# Patient Record
Sex: Female | Born: 1960 | Race: Black or African American | Hispanic: No | Marital: Single
Health system: Midwestern US, Community
[De-identification: ages and names within clinical notes are randomized; demographics above are authoritative.]

## PROBLEM LIST (undated history)

## (undated) DIAGNOSIS — J45909 Unspecified asthma, uncomplicated: Secondary | ICD-10-CM

## (undated) DIAGNOSIS — I739 Peripheral vascular disease, unspecified: Secondary | ICD-10-CM

## (undated) DIAGNOSIS — J811 Chronic pulmonary edema: Secondary | ICD-10-CM

## (undated) DIAGNOSIS — M179 Osteoarthritis of knee, unspecified: Secondary | ICD-10-CM

## (undated) DIAGNOSIS — I82409 Acute embolism and thrombosis of unspecified deep veins of unspecified lower extremity: Secondary | ICD-10-CM

## (undated) DIAGNOSIS — F419 Anxiety disorder, unspecified: Secondary | ICD-10-CM

## (undated) DIAGNOSIS — I10 Essential (primary) hypertension: Secondary | ICD-10-CM

## (undated) DIAGNOSIS — I509 Heart failure, unspecified: Secondary | ICD-10-CM

## (undated) DIAGNOSIS — G2581 Restless legs syndrome: Secondary | ICD-10-CM

## (undated) DIAGNOSIS — M1710 Unilateral primary osteoarthritis, unspecified knee: Secondary | ICD-10-CM

## (undated) DIAGNOSIS — N3946 Mixed incontinence: Secondary | ICD-10-CM

## (undated) DIAGNOSIS — M797 Fibromyalgia: Secondary | ICD-10-CM

## (undated) DIAGNOSIS — K029 Dental caries, unspecified: Secondary | ICD-10-CM

## (undated) DIAGNOSIS — E785 Hyperlipidemia, unspecified: Secondary | ICD-10-CM

## (undated) DIAGNOSIS — N9489 Other specified conditions associated with female genital organs and menstrual cycle: Secondary | ICD-10-CM

## (undated) DIAGNOSIS — M2550 Pain in unspecified joint: Secondary | ICD-10-CM

## (undated) DIAGNOSIS — Z973 Presence of spectacles and contact lenses: Secondary | ICD-10-CM

## (undated) DIAGNOSIS — N63 Unspecified lump in unspecified breast: Secondary | ICD-10-CM

## (undated) DIAGNOSIS — Z720 Tobacco use: Secondary | ICD-10-CM

## (undated) DIAGNOSIS — G894 Chronic pain syndrome: Secondary | ICD-10-CM

## (undated) DIAGNOSIS — F329 Major depressive disorder, single episode, unspecified: Secondary | ICD-10-CM

## (undated) DIAGNOSIS — K5732 Diverticulitis of large intestine without perforation or abscess without bleeding: Secondary | ICD-10-CM

## (undated) DIAGNOSIS — F32A Depression, unspecified: Secondary | ICD-10-CM

## (undated) DIAGNOSIS — F319 Bipolar disorder, unspecified: Secondary | ICD-10-CM

## (undated) DIAGNOSIS — N926 Irregular menstruation, unspecified: Secondary | ICD-10-CM

## (undated) DIAGNOSIS — Z1211 Encounter for screening for malignant neoplasm of colon: Secondary | ICD-10-CM

## (undated) HISTORY — DX: Osteoarthritis of knee, unspecified: M17.9

## (undated) HISTORY — DX: Diverticulitis of large intestine without perforation or abscess without bleeding: K57.32

## (undated) HISTORY — DX: Major depressive disorder, single episode, unspecified: F32.9

## (undated) HISTORY — DX: Depression, unspecified: F32.A

## (undated) HISTORY — DX: Mixed incontinence: N39.46

## (undated) HISTORY — DX: Tobacco use: Z72.0

## (undated) HISTORY — DX: Irregular menstruation, unspecified: N92.6

## (undated) HISTORY — DX: Unilateral primary osteoarthritis, unspecified knee: M17.10

## (undated) HISTORY — DX: Unspecified asthma, uncomplicated: J45.909

## (undated) HISTORY — DX: Peripheral vascular disease, unspecified: I73.9

## (undated) HISTORY — DX: Restless legs syndrome: G25.81

## (undated) HISTORY — DX: Chronic pain syndrome: G89.4

## (undated) HISTORY — DX: Unspecified lump in unspecified breast: N63.0

## (undated) HISTORY — DX: Hyperlipidemia, unspecified: E78.5

## (undated) HISTORY — DX: Anxiety disorder, unspecified: F41.9

## (undated) HISTORY — DX: Acute embolism and thrombosis of unspecified deep veins of unspecified lower extremity: I82.409

## (undated) HISTORY — DX: Essential (primary) hypertension: I10

## (undated) HISTORY — DX: Chronic pulmonary edema: J81.1

## (undated) HISTORY — DX: Other specified conditions associated with female genital organs and menstrual cycle: N94.89

## (undated) HISTORY — PX: TUBAL LIGATION: SHX77

## (undated) HISTORY — DX: Pain in unspecified joint: M25.50

## (undated) HISTORY — PX: TONSILLECTOMY: SUR1361

## (undated) HISTORY — PX: ABDOMINAL AORTAGRAM: SHX5706

---

## 2006-04-03 ENCOUNTER — Emergency Department (HOSPITAL_COMMUNITY): Admission: EM | Admit: 2006-04-03 | Discharge: 2006-04-03 | Payer: Self-pay | Admitting: Emergency Medicine

## 2006-04-12 ENCOUNTER — Ambulatory Visit: Payer: Self-pay | Admitting: Internal Medicine

## 2006-04-12 ENCOUNTER — Ambulatory Visit: Payer: Self-pay | Admitting: Pulmonary Disease

## 2006-04-12 ENCOUNTER — Inpatient Hospital Stay (HOSPITAL_COMMUNITY): Admission: EM | Admit: 2006-04-12 | Discharge: 2006-04-17 | Payer: Self-pay | Admitting: Emergency Medicine

## 2006-04-14 ENCOUNTER — Encounter (INDEPENDENT_AMBULATORY_CARE_PROVIDER_SITE_OTHER): Payer: Self-pay | Admitting: Interventional Cardiology

## 2006-04-21 ENCOUNTER — Ambulatory Visit: Payer: Self-pay | Admitting: Internal Medicine

## 2006-05-15 ENCOUNTER — Ambulatory Visit: Payer: Self-pay | Admitting: Internal Medicine

## 2006-05-29 ENCOUNTER — Ambulatory Visit: Payer: Self-pay | Admitting: Internal Medicine

## 2006-09-09 ENCOUNTER — Ambulatory Visit: Payer: Self-pay | Admitting: Internal Medicine

## 2006-09-12 ENCOUNTER — Ambulatory Visit: Payer: Self-pay | Admitting: Internal Medicine

## 2006-09-12 ENCOUNTER — Ambulatory Visit (HOSPITAL_COMMUNITY): Admission: RE | Admit: 2006-09-12 | Discharge: 2006-09-12 | Payer: Self-pay | Admitting: Internal Medicine

## 2006-09-12 ENCOUNTER — Encounter: Payer: Self-pay | Admitting: Vascular Surgery

## 2006-10-10 ENCOUNTER — Ambulatory Visit: Payer: Self-pay | Admitting: Internal Medicine

## 2006-10-10 ENCOUNTER — Encounter (INDEPENDENT_AMBULATORY_CARE_PROVIDER_SITE_OTHER): Payer: Self-pay | Admitting: Pulmonary Disease

## 2006-10-10 DIAGNOSIS — E782 Mixed hyperlipidemia: Secondary | ICD-10-CM | POA: Insufficient documentation

## 2006-10-10 LAB — CONVERTED CEMR LAB
ALT: 24 units/L (ref 0–40)
Albumin: 4.2 g/dL (ref 3.5–5.2)
Bilirubin, Direct: <0.1 mg/dL (ref 0.0–0.3)
Chloride: 99 meq/L (ref 96–112)
Cholesterol: 188 mg/dL (ref 0–200)
Glucose, Bld: 101 mg/dL — ABNORMAL HIGH (ref 70–99)
MCHC: 34.4 g/dL (ref 30.0–36.0)
Potassium: 4.3 meq/L (ref 3.5–5.3)
RBC: 4.47 M/uL (ref 3.87–5.11)
RDW: 12.8 % (ref 11.5–14.0)
Sodium: 134 meq/L — ABNORMAL LOW (ref 135–145)
Total CHOL/HDL Ratio: 6.7
Total Protein: 7.5 g/dL (ref 6.0–8.3)
Triglycerides: 213 mg/dL — ABNORMAL HIGH (ref <150)
VLDL: 43 mg/dL — ABNORMAL HIGH (ref 0–40)

## 2006-10-14 ENCOUNTER — Ambulatory Visit (HOSPITAL_COMMUNITY): Admission: RE | Admit: 2006-10-14 | Discharge: 2006-10-14 | Payer: Self-pay | Admitting: Vascular Surgery

## 2006-10-20 DIAGNOSIS — I739 Peripheral vascular disease, unspecified: Secondary | ICD-10-CM | POA: Insufficient documentation

## 2006-10-20 DIAGNOSIS — F17200 Nicotine dependence, unspecified, uncomplicated: Secondary | ICD-10-CM | POA: Insufficient documentation

## 2006-10-20 DIAGNOSIS — I10 Essential (primary) hypertension: Secondary | ICD-10-CM | POA: Insufficient documentation

## 2006-12-05 ENCOUNTER — Ambulatory Visit: Payer: Self-pay | Admitting: Internal Medicine

## 2007-03-16 ENCOUNTER — Encounter (INDEPENDENT_AMBULATORY_CARE_PROVIDER_SITE_OTHER): Payer: Self-pay | Admitting: Internal Medicine

## 2007-03-16 ENCOUNTER — Ambulatory Visit: Payer: Self-pay | Admitting: Internal Medicine

## 2007-03-16 LAB — CONVERTED CEMR LAB
BUN: 12 mg/dL (ref 6–23)
Calcium: 9.4 mg/dL (ref 8.4–10.5)
Creatinine, Ser: 0.86 mg/dL (ref 0.40–1.20)
Glucose, Bld: 103 mg/dL — ABNORMAL HIGH (ref 70–99)

## 2007-05-06 ENCOUNTER — Ambulatory Visit: Payer: Self-pay | Admitting: Internal Medicine

## 2007-07-24 ENCOUNTER — Encounter: Payer: Self-pay | Admitting: Licensed Clinical Social Worker

## 2007-07-24 ENCOUNTER — Ambulatory Visit: Payer: Self-pay | Admitting: Internal Medicine

## 2007-07-24 ENCOUNTER — Encounter (INDEPENDENT_AMBULATORY_CARE_PROVIDER_SITE_OTHER): Payer: Self-pay | Admitting: Internal Medicine

## 2007-07-24 ENCOUNTER — Telehealth: Payer: Self-pay | Admitting: *Deleted

## 2007-07-27 LAB — CONVERTED CEMR LAB
Albumin: 4.3 g/dL (ref 3.5–5.2)
Alkaline Phosphatase: 54 units/L (ref 39–117)
CO2: 23 meq/L (ref 19–32)
Eosinophils Absolute: 0.3 10*3/uL (ref 0.0–0.7)
Glucose, Bld: 88 mg/dL (ref 70–99)
LDL Cholesterol: 117 mg/dL — ABNORMAL HIGH (ref 0–99)
Lymphocytes Relative: 30 % (ref 12–46)
Lymphs Abs: 2.9 10*3/uL (ref 0.7–3.3)
Neutro Abs: 5.8 10*3/uL (ref 1.7–7.7)
Neutrophils Relative %: 60 % (ref 43–77)
Platelets: 260 10*3/uL (ref 150–400)
Potassium: 4.5 meq/L (ref 3.5–5.3)
Sodium: 139 meq/L (ref 135–145)
Total Protein: 7.2 g/dL (ref 6.0–8.3)
Triglycerides: 165 mg/dL — ABNORMAL HIGH (ref <150)
WBC: 9.7 10*3/uL (ref 4.0–10.5)

## 2007-08-31 DIAGNOSIS — N926 Irregular menstruation, unspecified: Secondary | ICD-10-CM

## 2007-08-31 HISTORY — DX: Irregular menstruation, unspecified: N92.6

## 2007-09-25 ENCOUNTER — Telehealth (INDEPENDENT_AMBULATORY_CARE_PROVIDER_SITE_OTHER): Payer: Self-pay | Admitting: *Deleted

## 2007-10-19 ENCOUNTER — Encounter (INDEPENDENT_AMBULATORY_CARE_PROVIDER_SITE_OTHER): Payer: Self-pay | Admitting: Internal Medicine

## 2007-10-19 ENCOUNTER — Ambulatory Visit: Payer: Self-pay | Admitting: Infectious Diseases

## 2007-10-20 LAB — CONVERTED CEMR LAB
Calcium: 9.6 mg/dL (ref 8.4–10.5)
Glucose, Bld: 104 mg/dL — ABNORMAL HIGH (ref 70–99)
LH: 9.8 milliintl units/mL
MCHC: 32.7 g/dL (ref 30.0–36.0)
MCV: 98.9 fL (ref 78.0–100.0)
Platelets: 259 10*3/uL (ref 150–400)
Potassium: 4.3 meq/L (ref 3.5–5.3)
Sodium: 143 meq/L (ref 135–145)
TSH: 3.247 microintl units/mL (ref 0.350–5.50)

## 2008-02-28 DIAGNOSIS — N63 Unspecified lump in unspecified breast: Secondary | ICD-10-CM

## 2008-02-28 HISTORY — DX: Unspecified lump in unspecified breast: N63.0

## 2008-03-03 ENCOUNTER — Ambulatory Visit: Payer: Self-pay | Admitting: Internal Medicine

## 2008-03-03 DIAGNOSIS — D249 Benign neoplasm of unspecified breast: Secondary | ICD-10-CM | POA: Insufficient documentation

## 2008-03-08 ENCOUNTER — Encounter (INDEPENDENT_AMBULATORY_CARE_PROVIDER_SITE_OTHER): Payer: Self-pay | Admitting: Internal Medicine

## 2008-03-10 ENCOUNTER — Encounter: Admission: RE | Admit: 2008-03-10 | Discharge: 2008-03-10 | Payer: Self-pay | Admitting: *Deleted

## 2008-03-29 ENCOUNTER — Telehealth: Payer: Self-pay | Admitting: *Deleted

## 2008-03-30 ENCOUNTER — Encounter (INDEPENDENT_AMBULATORY_CARE_PROVIDER_SITE_OTHER): Payer: Self-pay | Admitting: *Deleted

## 2008-03-30 ENCOUNTER — Encounter (INDEPENDENT_AMBULATORY_CARE_PROVIDER_SITE_OTHER): Payer: Self-pay | Admitting: Internal Medicine

## 2008-03-30 ENCOUNTER — Ambulatory Visit: Payer: Self-pay | Admitting: *Deleted

## 2008-03-31 LAB — CONVERTED CEMR LAB
Calcium: 9.6 mg/dL (ref 8.4–10.5)
Cholesterol: 211 mg/dL — ABNORMAL HIGH (ref 0–200)
HDL: 37 mg/dL — ABNORMAL LOW (ref >39)
Potassium: 3.9 meq/L (ref 3.5–5.3)
Sodium: 140 meq/L (ref 135–145)
Total CHOL/HDL Ratio: 5.7
Triglycerides: 167 mg/dL — ABNORMAL HIGH (ref <150)
VLDL: 33 mg/dL (ref 0–40)

## 2008-04-04 ENCOUNTER — Telehealth: Payer: Self-pay | Admitting: *Deleted

## 2008-04-07 ENCOUNTER — Encounter (INDEPENDENT_AMBULATORY_CARE_PROVIDER_SITE_OTHER): Payer: Self-pay | Admitting: Radiology

## 2008-04-07 ENCOUNTER — Encounter: Admission: RE | Admit: 2008-04-07 | Discharge: 2008-04-07 | Payer: Self-pay | Admitting: Internal Medicine

## 2008-04-14 ENCOUNTER — Encounter (INDEPENDENT_AMBULATORY_CARE_PROVIDER_SITE_OTHER): Payer: Self-pay | Admitting: Internal Medicine

## 2008-05-10 ENCOUNTER — Telehealth (INDEPENDENT_AMBULATORY_CARE_PROVIDER_SITE_OTHER): Payer: Self-pay | Admitting: Internal Medicine

## 2008-05-12 ENCOUNTER — Telehealth: Payer: Self-pay | Admitting: *Deleted

## 2008-05-12 ENCOUNTER — Encounter (INDEPENDENT_AMBULATORY_CARE_PROVIDER_SITE_OTHER): Payer: Self-pay | Admitting: *Deleted

## 2008-05-12 ENCOUNTER — Ambulatory Visit: Payer: Self-pay | Admitting: *Deleted

## 2008-06-06 LAB — CONVERTED CEMR LAB
AST: 12 units/L (ref 0–37)
Albumin: 4.7 g/dL (ref 3.5–5.2)
Alkaline Phosphatase: 52 units/L (ref 39–117)
BUN: 15 mg/dL (ref 6–23)
Creatinine, Ser: 0.8 mg/dL (ref 0.40–1.20)
Glucose, Bld: 108 mg/dL — ABNORMAL HIGH (ref 70–99)
HDL: 32 mg/dL — ABNORMAL LOW (ref >39)
LDL Cholesterol: 93 mg/dL (ref 0–99)
Total Bilirubin: 0.4 mg/dL (ref 0.3–1.2)
Total CHOL/HDL Ratio: 5
Triglycerides: 175 mg/dL — ABNORMAL HIGH (ref <150)

## 2008-06-13 ENCOUNTER — Encounter (INDEPENDENT_AMBULATORY_CARE_PROVIDER_SITE_OTHER): Payer: Self-pay | Admitting: Surgery

## 2008-06-13 ENCOUNTER — Ambulatory Visit (HOSPITAL_COMMUNITY): Admission: RE | Admit: 2008-06-13 | Discharge: 2008-06-13 | Payer: Self-pay | Admitting: Surgery

## 2008-06-13 ENCOUNTER — Encounter: Admission: RE | Admit: 2008-06-13 | Discharge: 2008-06-13 | Payer: Self-pay | Admitting: Surgery

## 2008-06-13 HISTORY — PX: BREAST LUMPECTOMY W/ NEEDLE LOCALIZATION: SHX1266

## 2008-09-07 ENCOUNTER — Encounter: Payer: Self-pay | Admitting: Internal Medicine

## 2008-09-07 ENCOUNTER — Encounter (INDEPENDENT_AMBULATORY_CARE_PROVIDER_SITE_OTHER): Payer: Self-pay | Admitting: Internal Medicine

## 2008-09-07 ENCOUNTER — Ambulatory Visit: Payer: Self-pay | Admitting: *Deleted

## 2008-09-14 ENCOUNTER — Telehealth (INDEPENDENT_AMBULATORY_CARE_PROVIDER_SITE_OTHER): Payer: Self-pay | Admitting: Internal Medicine

## 2008-09-20 LAB — CONVERTED CEMR LAB
Albumin: 4.6 g/dL (ref 3.5–5.2)
Alkaline Phosphatase: 55 units/L (ref 39–117)
BUN: 18 mg/dL (ref 6–23)
Calcium: 9.9 mg/dL (ref 8.4–10.5)
Creatinine, Ser: 0.84 mg/dL (ref 0.40–1.20)
Glucose, Bld: 102 mg/dL — ABNORMAL HIGH (ref 70–99)
HDL: 37 mg/dL — ABNORMAL LOW (ref >39)
LDL Cholesterol: 144 mg/dL — ABNORMAL HIGH (ref 0–99)
Potassium: 4.5 meq/L (ref 3.5–5.3)
Total CHOL/HDL Ratio: 6.1
Triglycerides: 229 mg/dL — ABNORMAL HIGH (ref <150)

## 2008-09-28 ENCOUNTER — Ambulatory Visit: Payer: Self-pay | Admitting: Vascular Surgery

## 2008-09-28 ENCOUNTER — Encounter (INDEPENDENT_AMBULATORY_CARE_PROVIDER_SITE_OTHER): Payer: Self-pay | Admitting: Internal Medicine

## 2008-10-03 ENCOUNTER — Ambulatory Visit (HOSPITAL_COMMUNITY): Admission: RE | Admit: 2008-10-03 | Discharge: 2008-10-03 | Payer: Self-pay | Admitting: Vascular Surgery

## 2008-10-05 ENCOUNTER — Encounter (INDEPENDENT_AMBULATORY_CARE_PROVIDER_SITE_OTHER): Payer: Self-pay | Admitting: Internal Medicine

## 2008-10-05 ENCOUNTER — Ambulatory Visit: Payer: Self-pay | Admitting: Infectious Disease

## 2008-10-09 LAB — CONVERTED CEMR LAB
BUN: 16 mg/dL (ref 6–23)
CO2: 21 meq/L (ref 19–32)
Creatinine, Ser: 0.92 mg/dL (ref 0.40–1.20)
Eosinophils Relative: 2 % (ref 0–5)
Free T4: 0.91 ng/dL (ref 0.89–1.80)
Glucose, Bld: 94 mg/dL (ref 70–99)
HCT: 45.1 % (ref 36.0–46.0)
Hemoglobin: 14.7 g/dL (ref 12.0–15.0)
INR: 1 (ref 0.0–1.5)
Lymphocytes Relative: 32 % (ref 12–46)
Lymphs Abs: 3.2 10*3/uL (ref 0.7–4.0)
Monocytes Absolute: 0.7 10*3/uL (ref 0.1–1.0)
TSH: 5.059 microintl units/mL — ABNORMAL HIGH (ref 0.350–4.50)
Total Bilirubin: 0.4 mg/dL (ref 0.3–1.2)
WBC: 10 10*3/uL (ref 4.0–10.5)

## 2008-10-26 ENCOUNTER — Ambulatory Visit: Payer: Self-pay | Admitting: Vascular Surgery

## 2008-10-28 ENCOUNTER — Encounter (INDEPENDENT_AMBULATORY_CARE_PROVIDER_SITE_OTHER): Payer: Self-pay | Admitting: Internal Medicine

## 2008-10-28 ENCOUNTER — Ambulatory Visit: Payer: Self-pay | Admitting: Internal Medicine

## 2008-11-04 ENCOUNTER — Ambulatory Visit: Payer: Self-pay | Admitting: Vascular Surgery

## 2008-11-04 ENCOUNTER — Ambulatory Visit (HOSPITAL_COMMUNITY): Admission: RE | Admit: 2008-11-04 | Discharge: 2008-11-04 | Payer: Self-pay | Admitting: Vascular Surgery

## 2008-11-04 HISTORY — PX: OTHER SURGICAL HISTORY: SHX169

## 2008-11-21 ENCOUNTER — Encounter: Payer: Self-pay | Admitting: Vascular Surgery

## 2008-11-21 ENCOUNTER — Inpatient Hospital Stay (HOSPITAL_COMMUNITY): Admission: RE | Admit: 2008-11-21 | Discharge: 2008-11-27 | Payer: Self-pay | Admitting: Vascular Surgery

## 2008-11-21 HISTORY — PX: FEMORAL-POPLITEAL BYPASS GRAFT: SHX937

## 2008-11-21 HISTORY — PX: FEMORAL ENDARTERECTOMY: SUR606

## 2008-11-22 ENCOUNTER — Encounter: Payer: Self-pay | Admitting: Vascular Surgery

## 2008-11-23 ENCOUNTER — Telehealth (INDEPENDENT_AMBULATORY_CARE_PROVIDER_SITE_OTHER): Payer: Self-pay | Admitting: Internal Medicine

## 2008-12-01 ENCOUNTER — Telehealth (INDEPENDENT_AMBULATORY_CARE_PROVIDER_SITE_OTHER): Payer: Self-pay | Admitting: Internal Medicine

## 2008-12-07 ENCOUNTER — Ambulatory Visit: Payer: Self-pay | Admitting: Vascular Surgery

## 2008-12-14 ENCOUNTER — Telehealth: Payer: Self-pay | Admitting: Infectious Diseases

## 2008-12-22 ENCOUNTER — Ambulatory Visit: Payer: Self-pay | Admitting: *Deleted

## 2008-12-28 ENCOUNTER — Ambulatory Visit: Payer: Self-pay | Admitting: Vascular Surgery

## 2009-01-25 ENCOUNTER — Ambulatory Visit: Payer: Self-pay | Admitting: Vascular Surgery

## 2009-01-27 ENCOUNTER — Telehealth (INDEPENDENT_AMBULATORY_CARE_PROVIDER_SITE_OTHER): Payer: Self-pay | Admitting: Internal Medicine

## 2009-01-30 ENCOUNTER — Telehealth (INDEPENDENT_AMBULATORY_CARE_PROVIDER_SITE_OTHER): Payer: Self-pay | Admitting: Internal Medicine

## 2009-01-31 ENCOUNTER — Ambulatory Visit: Payer: Self-pay | Admitting: Internal Medicine

## 2009-01-31 ENCOUNTER — Telehealth (INDEPENDENT_AMBULATORY_CARE_PROVIDER_SITE_OTHER): Payer: Self-pay | Admitting: *Deleted

## 2009-01-31 DIAGNOSIS — K089 Disorder of teeth and supporting structures, unspecified: Secondary | ICD-10-CM | POA: Insufficient documentation

## 2009-02-20 ENCOUNTER — Ambulatory Visit: Payer: Self-pay | Admitting: Vascular Surgery

## 2009-02-27 ENCOUNTER — Encounter (INDEPENDENT_AMBULATORY_CARE_PROVIDER_SITE_OTHER): Payer: Self-pay | Admitting: Internal Medicine

## 2009-02-27 ENCOUNTER — Ambulatory Visit: Payer: Self-pay | Admitting: *Deleted

## 2009-02-27 DIAGNOSIS — M25569 Pain in unspecified knee: Secondary | ICD-10-CM | POA: Insufficient documentation

## 2009-02-27 LAB — CONVERTED CEMR LAB
ALT: 13 units/L (ref 0–35)
AST: 12 units/L (ref 0–37)
Alkaline Phosphatase: 59 units/L (ref 39–117)
Anti Nuclear Antibody(ANA): NEGATIVE
Basophils Relative: 1 % (ref 0–1)
Calcium: 9.3 mg/dL (ref 8.4–10.5)
Chloride: 107 meq/L (ref 96–112)
Creatinine, Ser: 0.68 mg/dL (ref 0.40–1.20)
Cyclic Citrullin Peptide Ab: 0 units (ref <7)
Hemoglobin: 13.9 g/dL (ref 12.0–15.0)
LDL Cholesterol: 66 mg/dL (ref 0–99)
Monocytes Relative: 7 % (ref 3–12)
Neutro Abs: 5 10*3/uL (ref 1.7–7.7)
Neutrophils Relative %: 61 % (ref 43–77)
RBC: 4.21 M/uL (ref 3.87–5.11)
RDW: 14.1 % (ref 11.5–15.5)
Sed Rate: 19 mm/hr (ref 0–22)
Total Bilirubin: 0.3 mg/dL (ref 0.3–1.2)
Total CHOL/HDL Ratio: 3.8
VLDL: 21 mg/dL (ref 0–40)

## 2009-04-19 ENCOUNTER — Ambulatory Visit: Payer: Self-pay | Admitting: Vascular Surgery

## 2009-05-18 ENCOUNTER — Ambulatory Visit: Payer: Self-pay | Admitting: Internal Medicine

## 2009-05-30 ENCOUNTER — Encounter: Payer: Self-pay | Admitting: Licensed Clinical Social Worker

## 2009-07-17 ENCOUNTER — Ambulatory Visit (HOSPITAL_COMMUNITY): Admission: RE | Admit: 2009-07-17 | Discharge: 2009-07-17 | Payer: Self-pay | Admitting: Infectious Diseases

## 2009-07-17 ENCOUNTER — Ambulatory Visit: Payer: Self-pay | Admitting: Infectious Diseases

## 2009-07-17 DIAGNOSIS — F418 Other specified anxiety disorders: Secondary | ICD-10-CM | POA: Insufficient documentation

## 2009-07-18 ENCOUNTER — Encounter (INDEPENDENT_AMBULATORY_CARE_PROVIDER_SITE_OTHER): Payer: Self-pay | Admitting: Internal Medicine

## 2009-08-03 ENCOUNTER — Ambulatory Visit: Payer: Self-pay | Admitting: Vascular Surgery

## 2009-08-21 ENCOUNTER — Telehealth (INDEPENDENT_AMBULATORY_CARE_PROVIDER_SITE_OTHER): Payer: Self-pay | Admitting: Internal Medicine

## 2009-08-21 ENCOUNTER — Ambulatory Visit: Payer: Self-pay | Admitting: Internal Medicine

## 2009-08-22 ENCOUNTER — Encounter (INDEPENDENT_AMBULATORY_CARE_PROVIDER_SITE_OTHER): Payer: Self-pay | Admitting: Internal Medicine

## 2009-08-22 ENCOUNTER — Encounter: Payer: Self-pay | Admitting: Licensed Clinical Social Worker

## 2009-08-23 ENCOUNTER — Telehealth (INDEPENDENT_AMBULATORY_CARE_PROVIDER_SITE_OTHER): Payer: Self-pay | Admitting: Internal Medicine

## 2009-08-23 LAB — CONVERTED CEMR LAB
Alkaline Phosphatase: 56 units/L (ref 39–117)
BUN: 13 mg/dL (ref 6–23)
Cholesterol: 130 mg/dL (ref 0–200)
Creatinine, Ser: 0.68 mg/dL (ref 0.40–1.20)
Glucose, Bld: 83 mg/dL (ref 70–99)
HDL: 27 mg/dL — ABNORMAL LOW (ref >39)
LDL Cholesterol: 71 mg/dL (ref 0–99)
Total Bilirubin: 0.3 mg/dL (ref 0.3–1.2)
Triglycerides: 161 mg/dL — ABNORMAL HIGH (ref <150)
VLDL: 32 mg/dL (ref 0–40)

## 2009-08-29 ENCOUNTER — Ambulatory Visit: Payer: Self-pay | Admitting: Internal Medicine

## 2009-08-29 ENCOUNTER — Telehealth: Payer: Self-pay | Admitting: Licensed Clinical Social Worker

## 2009-08-29 ENCOUNTER — Encounter (INDEPENDENT_AMBULATORY_CARE_PROVIDER_SITE_OTHER): Payer: Self-pay | Admitting: Internal Medicine

## 2009-08-31 ENCOUNTER — Telehealth: Payer: Self-pay | Admitting: Internal Medicine

## 2009-09-01 ENCOUNTER — Encounter (INDEPENDENT_AMBULATORY_CARE_PROVIDER_SITE_OTHER): Payer: Self-pay | Admitting: Internal Medicine

## 2009-09-12 ENCOUNTER — Telehealth: Payer: Self-pay | Admitting: Licensed Clinical Social Worker

## 2009-09-12 ENCOUNTER — Telehealth: Payer: Self-pay | Admitting: *Deleted

## 2009-09-13 ENCOUNTER — Telehealth: Payer: Self-pay | Admitting: Licensed Clinical Social Worker

## 2009-09-18 ENCOUNTER — Encounter: Payer: Self-pay | Admitting: Licensed Clinical Social Worker

## 2009-09-18 ENCOUNTER — Telehealth: Payer: Self-pay | Admitting: Licensed Clinical Social Worker

## 2009-09-21 ENCOUNTER — Telehealth (INDEPENDENT_AMBULATORY_CARE_PROVIDER_SITE_OTHER): Payer: Self-pay | Admitting: Internal Medicine

## 2009-09-25 ENCOUNTER — Telehealth: Payer: Self-pay | Admitting: Licensed Clinical Social Worker

## 2009-09-25 ENCOUNTER — Ambulatory Visit: Payer: Self-pay | Admitting: Internal Medicine

## 2009-09-25 DIAGNOSIS — E079 Disorder of thyroid, unspecified: Secondary | ICD-10-CM | POA: Insufficient documentation

## 2009-09-26 ENCOUNTER — Telehealth (INDEPENDENT_AMBULATORY_CARE_PROVIDER_SITE_OTHER): Payer: Self-pay | Admitting: Internal Medicine

## 2009-09-27 ENCOUNTER — Ambulatory Visit: Payer: Self-pay | Admitting: Vascular Surgery

## 2009-10-02 ENCOUNTER — Telehealth (INDEPENDENT_AMBULATORY_CARE_PROVIDER_SITE_OTHER): Payer: Self-pay | Admitting: Internal Medicine

## 2009-10-06 ENCOUNTER — Telehealth: Payer: Self-pay | Admitting: *Deleted

## 2009-10-10 ENCOUNTER — Ambulatory Visit: Payer: Self-pay | Admitting: Internal Medicine

## 2009-10-10 LAB — CONVERTED CEMR LAB
ALT: 15 units/L (ref 0–35)
Albumin: 4.8 g/dL (ref 3.5–5.2)
BUN: 8 mg/dL (ref 6–23)
CO2: 21 meq/L (ref 19–32)
CRP: 0.2 mg/dL (ref <0.6)
Calcium: 9.5 mg/dL (ref 8.4–10.5)
Chloride: 106 meq/L (ref 96–112)
Creatinine, Ser: 0.79 mg/dL (ref 0.40–1.20)
Potassium: 4.3 meq/L (ref 3.5–5.3)
T3, Free: 2.4 pg/mL (ref 2.3–4.2)

## 2009-10-11 ENCOUNTER — Ambulatory Visit (HOSPITAL_COMMUNITY): Admission: RE | Admit: 2009-10-11 | Discharge: 2009-10-11 | Payer: Self-pay | Admitting: Internal Medicine

## 2009-10-11 ENCOUNTER — Ambulatory Visit: Payer: Self-pay | Admitting: Internal Medicine

## 2009-10-18 ENCOUNTER — Telehealth: Payer: Self-pay | Admitting: Licensed Clinical Social Worker

## 2009-10-18 ENCOUNTER — Telehealth (INDEPENDENT_AMBULATORY_CARE_PROVIDER_SITE_OTHER): Payer: Self-pay | Admitting: Internal Medicine

## 2009-10-24 ENCOUNTER — Encounter: Payer: Self-pay | Admitting: Internal Medicine

## 2009-10-25 ENCOUNTER — Telehealth (INDEPENDENT_AMBULATORY_CARE_PROVIDER_SITE_OTHER): Payer: Self-pay | Admitting: Internal Medicine

## 2009-10-30 ENCOUNTER — Encounter (INDEPENDENT_AMBULATORY_CARE_PROVIDER_SITE_OTHER): Payer: Self-pay | Admitting: Internal Medicine

## 2009-11-06 ENCOUNTER — Telehealth (INDEPENDENT_AMBULATORY_CARE_PROVIDER_SITE_OTHER): Payer: Self-pay | Admitting: Internal Medicine

## 2009-11-20 ENCOUNTER — Telehealth (INDEPENDENT_AMBULATORY_CARE_PROVIDER_SITE_OTHER): Payer: Self-pay | Admitting: Internal Medicine

## 2009-11-30 ENCOUNTER — Ambulatory Visit: Payer: Self-pay | Admitting: Vascular Surgery

## 2009-12-01 ENCOUNTER — Encounter (INDEPENDENT_AMBULATORY_CARE_PROVIDER_SITE_OTHER): Payer: Self-pay | Admitting: Internal Medicine

## 2009-12-01 ENCOUNTER — Ambulatory Visit: Payer: Self-pay | Admitting: Infectious Diseases

## 2009-12-01 LAB — CONVERTED CEMR LAB
Alkaline Phosphatase: 56 units/L (ref 39–117)
Cholesterol: 137 mg/dL (ref 0–200)
Creatinine, Ser: 0.71 mg/dL (ref 0.40–1.20)
Glucose, Bld: 93 mg/dL (ref 70–99)
HDL: 33 mg/dL — ABNORMAL LOW (ref >39)
LDL Cholesterol: 83 mg/dL (ref 0–99)
Sodium: 141 meq/L (ref 135–145)
Total Bilirubin: 0.2 mg/dL — ABNORMAL LOW (ref 0.3–1.2)
Total CHOL/HDL Ratio: 4.2
Total Protein: 6.9 g/dL (ref 6.0–8.3)
Triglycerides: 104 mg/dL (ref <150)
VLDL: 21 mg/dL (ref 0–40)

## 2009-12-04 ENCOUNTER — Telehealth (INDEPENDENT_AMBULATORY_CARE_PROVIDER_SITE_OTHER): Payer: Self-pay | Admitting: Internal Medicine

## 2009-12-05 ENCOUNTER — Inpatient Hospital Stay (HOSPITAL_COMMUNITY): Admission: AD | Admit: 2009-12-05 | Discharge: 2009-12-06 | Payer: Self-pay | Admitting: Psychiatry

## 2009-12-05 ENCOUNTER — Ambulatory Visit: Payer: Self-pay | Admitting: Psychiatry

## 2009-12-05 ENCOUNTER — Emergency Department (HOSPITAL_COMMUNITY): Admission: EM | Admit: 2009-12-05 | Discharge: 2009-12-05 | Payer: Self-pay | Admitting: Emergency Medicine

## 2009-12-11 ENCOUNTER — Encounter (INDEPENDENT_AMBULATORY_CARE_PROVIDER_SITE_OTHER): Payer: Self-pay | Admitting: Internal Medicine

## 2009-12-24 ENCOUNTER — Encounter (INDEPENDENT_AMBULATORY_CARE_PROVIDER_SITE_OTHER): Payer: Self-pay | Admitting: Internal Medicine

## 2009-12-26 ENCOUNTER — Telehealth (INDEPENDENT_AMBULATORY_CARE_PROVIDER_SITE_OTHER): Payer: Self-pay | Admitting: Internal Medicine

## 2009-12-27 ENCOUNTER — Telehealth (INDEPENDENT_AMBULATORY_CARE_PROVIDER_SITE_OTHER): Payer: Self-pay | Admitting: Internal Medicine

## 2009-12-27 ENCOUNTER — Ambulatory Visit: Payer: Self-pay | Admitting: Internal Medicine

## 2009-12-28 ENCOUNTER — Encounter (INDEPENDENT_AMBULATORY_CARE_PROVIDER_SITE_OTHER): Payer: Self-pay | Admitting: Internal Medicine

## 2010-01-04 MED ORDER — NAPROXEN 500 MG TAB
500 mg | ORAL_TABLET | Freq: Two times a day (BID) | ORAL | Status: AC
Start: 2010-01-04 — End: 2010-01-14

## 2010-01-04 MED ORDER — TRAMADOL 50 MG TAB
50 mg | ORAL_TABLET | Freq: Four times a day (QID) | ORAL | Status: AC | PRN
Start: 2010-01-04 — End: 2010-01-14

## 2010-01-04 MED ADMIN — tramadol (ULTRAM) tablet 50 mg: ORAL | @ 11:00:00 | NDC 51079099120

## 2010-01-04 MED FILL — TRAMADOL 50 MG TAB: 50 mg | ORAL | Qty: 1
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
## 2010-01-04 NOTE — ED Notes (Incomplete)
I have reviewed discharge instructions with the patient.  The patient verbalized understanding.
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
## 2010-01-06 MED ORDER — DIAZEPAM 5 MG TAB
5 mg | ORAL_TABLET | Freq: Three times a day (TID) | ORAL | Status: DC | PRN
Start: 2010-01-06 — End: 2010-01-28

## 2010-01-06 MED ADMIN — oxycodone-acetaminophen (PERCOCET) 5-325 mg per tablet 2 Tab: ORAL | @ 04:00:00 | NDC 00406051262

## 2010-01-06 MED ADMIN — ketorolac (TORADOL) injection 60 mg: INTRAMUSCULAR | @ 04:00:00 | NDC 00409379501

## 2010-01-06 MED ADMIN — diazepam (VALIUM) tablet 5 mg: ORAL | @ 04:00:00 | NDC 51079028501

## 2010-01-06 MED FILL — KETOROLAC TROMETHAMINE 30 MG/ML INJECTION: 30 mg/mL (1 mL) | INTRAMUSCULAR | Qty: 2

## 2010-01-06 MED FILL — OXYCODONE-ACETAMINOPHEN 5 MG-325 MG TAB: 5-325 mg | ORAL | Qty: 2

## 2010-01-06 MED FILL — DIAZEPAM 5 MG TAB: 5 mg | ORAL | Qty: 1

## 2010-01-06 NOTE — Progress Notes (Signed)
..  I have reviewed discharge instructions with the patient.  The patient verbalized understanding. E signature pad not working at discharge

## 2010-01-08 ENCOUNTER — Telehealth: Payer: Self-pay | Admitting: Internal Medicine

## 2010-01-12 ENCOUNTER — Ambulatory Visit: Payer: Self-pay | Admitting: Internal Medicine

## 2010-01-12 DIAGNOSIS — H547 Unspecified visual loss: Secondary | ICD-10-CM | POA: Insufficient documentation

## 2010-01-15 ENCOUNTER — Encounter (HOSPITAL_COMMUNITY): Admission: RE | Admit: 2010-01-15 | Discharge: 2010-04-06 | Payer: Self-pay | Admitting: Internal Medicine

## 2010-01-26 ENCOUNTER — Telehealth (INDEPENDENT_AMBULATORY_CARE_PROVIDER_SITE_OTHER): Payer: Self-pay | Admitting: Internal Medicine

## 2010-01-28 LAB — METABOLIC PANEL, COMPREHENSIVE
A-G Ratio: 0.9 — ABNORMAL LOW (ref 1.1–2.2)
ALT (SGPT): 39 U/L (ref 12–78)
AST (SGOT): 38 U/L — ABNORMAL HIGH (ref 15–37)
Albumin: 3.4 g/dL — ABNORMAL LOW (ref 3.5–5.0)
Alk. phosphatase: 119 U/L (ref 50–136)
Anion gap: 9 mmol/L (ref 5–15)
BUN/Creatinine ratio: 7 — ABNORMAL LOW (ref 12–20)
BUN: 11 MG/DL (ref 6–20)
Bilirubin, total: 0.4 MG/DL (ref 0.2–1.0)
CO2: 27 MMOL/L (ref 21–32)
Calcium: 8.6 MG/DL (ref 8.5–10.1)
Chloride: 103 MMOL/L (ref 97–108)
Creatinine: 1.5 MG/DL — ABNORMAL HIGH (ref 0.6–1.3)
GFR est AA: 48 mL/min/{1.73_m2} — ABNORMAL LOW (ref >60)
GFR est non-AA: 40 mL/min/{1.73_m2} — ABNORMAL LOW (ref >60)
Globulin: 3.6 g/dL (ref 2.0–4.0)
Glucose: 91 MG/DL (ref 65–100)
Potassium: 3.2 MMOL/L — ABNORMAL LOW (ref 3.5–5.1)
Protein, total: 7 g/dL (ref 6.4–8.2)
Sodium: 139 MMOL/L (ref 136–145)

## 2010-01-28 LAB — CBC WITH AUTOMATED DIFF
ABS. BASOPHILS: 0 10*3/uL (ref 0.0–0.1)
ABS. EOSINOPHILS: 0 10*3/uL (ref 0.0–0.4)
ABS. LYMPHOCYTES: 1.2 10*3/uL (ref 0.8–3.5)
ABS. MONOCYTES: 0.7 10*3/uL (ref 0.0–1.0)
ABS. NEUTROPHILS: 8.1 10*3/uL — ABNORMAL HIGH (ref 1.8–8.0)
BASOPHILS: 0 % (ref 0–1)
EOSINOPHILS: 0 % (ref 0–7)
HCT: 38.1 % (ref 35.0–47.0)
HGB: 12.5 g/dL (ref 11.5–16.0)
LYMPHOCYTES: 12 % (ref 12–49)
MCH: 30.4 PG (ref 26.0–34.0)
MCHC: 32.8 g/dL (ref 30.0–36.5)
MCV: 92.7 FL (ref 80.0–99.0)
MONOCYTES: 7 % (ref 5–13)
NEUTROPHILS: 81 % — ABNORMAL HIGH (ref 32–75)
PLATELET: 211 10*3/uL (ref 150–400)
RBC: 4.11 M/uL (ref 3.80–5.20)
RDW: 12.7 % (ref 11.5–14.5)
WBC: 10.1 10*3/uL (ref 3.6–11.0)

## 2010-01-28 LAB — URINALYSIS W/MICROSCOPIC
Blood: NEGATIVE
Glucose: NEGATIVE MG/DL
Ketone: 40 MG/DL — AB
Leukocyte Esterase: NEGATIVE
Nitrites: NEGATIVE
Protein: 100 MG/DL — AB
Specific gravity: 1.029 (ref 1.003–1.030)
Urobilinogen: 2 EU/DL — ABNORMAL HIGH (ref 0.2–1.0)
pH (UA): 6 (ref 5.0–8.0)

## 2010-01-28 LAB — BILIRUBIN, CONFIRM: Bilirubin UA, confirm: NEGATIVE

## 2010-01-28 MED ORDER — CEPHALEXIN 500 MG CAP
500 mg | ORAL_CAPSULE | Freq: Three times a day (TID) | ORAL | Status: AC
Start: 2010-01-28 — End: 2010-02-04

## 2010-01-28 MED ORDER — HYDROCODONE-ACETAMINOPHEN 7.5 MG-500 MG/15 ML ORAL SOLN
Freq: Four times a day (QID) | ORAL | Status: DC | PRN
Start: 2010-01-28 — End: 2010-08-04

## 2010-01-28 MED ADMIN — ondansetron (ZOFRAN) injection 4 mg: INTRAVENOUS | @ 16:00:00 | NDC 00143989105

## 2010-01-28 MED ADMIN — oxycodone-acetaminophen (PERCOCET) 5-325 mg per tablet 2 Tab: ORAL | @ 17:00:00 | NDC 00406051262

## 2010-01-28 MED ADMIN — saline peripheral flush 5 mL: @ 17:00:00 | NDC 82903065462

## 2010-01-28 MED ADMIN — sodium chloride 0.9 % bolus infusion 1,000 mL: INTRAVENOUS | @ 16:00:00 | NDC 00409798309

## 2010-01-28 MED ADMIN — potassium chloride SR (KLOR-CON 10) tablet 40 mEq: ORAL | @ 16:00:00 | NDC 00074780419

## 2010-01-28 MED ADMIN — ketorolac (TORADOL) injection 30 mg: INTRAVENOUS | @ 16:00:00 | NDC 00409379501

## 2010-01-28 MED ADMIN — sodium chloride 0.9 % bolus infusion 1,000 mL: INTRAVENOUS | @ 19:00:00 | NDC 00409798309

## 2010-01-28 MED FILL — SODIUM CHLORIDE 0.9 % IV: INTRAVENOUS | Qty: 1000

## 2010-01-28 MED FILL — SALINE FLUSH INJECTION SYRINGE: INTRAMUSCULAR | Qty: 10

## 2010-01-28 MED FILL — OXYCODONE-ACETAMINOPHEN 5 MG-325 MG TAB: 5-325 mg | ORAL | Qty: 2

## 2010-01-28 MED FILL — KETOROLAC TROMETHAMINE 30 MG/ML INJECTION: 30 mg/mL (1 mL) | INTRAMUSCULAR | Qty: 1

## 2010-01-28 MED FILL — K-TAB 10 MEQ TABLET,EXTENDED RELEASE: 10 mEq | ORAL | Qty: 4

## 2010-01-28 MED FILL — ONDANSETRON (PF) 4 MG/2 ML INJECTION: 4 mg/2 mL | INTRAMUSCULAR | Qty: 2

## 2010-01-28 NOTE — ED Notes (Signed)
Pt resting in bed. Lights turned off. Call bell in hand. IVF infusing

## 2010-01-28 NOTE — ED Notes (Signed)
I have reviewed discharge instructions with the patient.  The patient verbalized understanding. Sign pad not working in room. Pt verbalized understanding of all medications and discharge instructions. Ambulatory out of ED with papers in hand

## 2010-01-28 NOTE — ED Notes (Signed)
Pt sleeping. IVF infusing. Pt and family member updated on plan of care

## 2010-01-28 NOTE — ED Notes (Signed)
Pt presents to ED with complaints of body aches, fevers, chills, cough and "the flu" since Friday

## 2010-01-28 NOTE — ED Provider Notes (Signed)
Patient is a 49 y.o. female presenting with flu symptoms.   Flu   Associated symptoms include vomiting, headaches, sore throat and cough. Pertinent negatives include no diarrhea.    49 year old female presents ambulatory to [facility] ED with cc of cough, HA, sore throat, fever, nausea and vomiting x 2 days. Pt's max temp of fever as 102 F per nurse. Pt also complains of generalized body aches, but denies diarrhea. Pt denies hx of HTN or DM.  PSHX: none  NKDA  PCP: none    10:27 AM  There are no other complaints, changes or physical findings at this time.  Written by Jeronimo Greaves, ED Scribe; dictated by Dr. Delena Bali      No family history on file.     History   Social History   ??? Marital Status: Single     Spouse Name: N/A     Number of Children: N/A   ??? Years of Education: N/A   Occupational History   ??? Not on file.   Social History Main Topics   ??? Smoking status: Current Everyday Smoker   ??? Smokeless tobacco: Not on file   ??? Alcohol Use: Yes   ??? Drug Use: No   ??? Sexually Active:    Other Topics Concern   ??? Not on file   Social History Narrative   ??? No narrative on file           ALLERGIES: Review of patient's allergies indicates no known allergies.      Review of Systems   Constitutional: Positive for fever.   HENT: Positive for sore throat.    Eyes: Negative.    Respiratory: Positive for cough.    Cardiovascular: Negative.    Gastrointestinal: Positive for nausea and vomiting. Negative for diarrhea.   Genitourinary: Negative.    Musculoskeletal: Positive for myalgias.   Skin: Negative.    Neurological: Positive for headaches.   All other systems reviewed and are negative.        Filed Vitals:    01/28/2010  9:47 AM   BP: 104/74   Pulse: 79   Temp: 102.4 ??F (39.1 ??C)   Resp: 20   Weight: 163 lb 3 oz (74.021 kg)   SpO2: 99%              Physical Exam   Constitutional: She is oriented to person, place, and time. She appears well-developed and well-nourished. No distress.   HENT:    Head: Normocephalic and atraumatic.   Mouth/Throat: Oropharynx is clear and moist. No oropharyngeal exudate.   Eyes: Conjunctivae and extraocular motions are normal. Pupils are equal, round, and reactive to light. Right eye exhibits no discharge. Left eye exhibits no discharge. No scleral icterus.   Neck: Normal range of motion. Neck supple. No JVD present. No tracheal deviation present.   Cardiovascular: Normal rate, regular rhythm and normal heart sounds.  Exam reveals no gallop and no friction rub.    Pulmonary/Chest: Effort normal and breath sounds normal. No respiratory distress. She has no wheezes. She has no rales.   Abdominal: Soft. Bowel sounds are normal. She exhibits no distension. No tenderness. She has no rebound and no guarding.   Musculoskeletal: Normal range of motion. She exhibits no edema and no tenderness.   Lymphadenopathy:     She has no cervical adenopathy.   Neurological: She is alert and oriented to person, place, and time. No cranial nerve deficit.   Skin: Skin is warm and  dry. No rash noted. She is not diaphoretic.   Psychiatric: She has a normal mood and affect.        MDM Coding   Reviewed: vitals and nursing note  Interpretation: labs        Procedures    Pt has been reexamined and is feeling much better.  Diagnosis, Lab results and studies reviewed with patient/family with good understanding.  Pt/family agreed to return to ER if any worsening of sx or deterioration in condition.  Pt agreed to follow up with primary doctor as instructed.

## 2010-01-31 ENCOUNTER — Telehealth (INDEPENDENT_AMBULATORY_CARE_PROVIDER_SITE_OTHER): Payer: Self-pay | Admitting: Internal Medicine

## 2010-02-22 ENCOUNTER — Ambulatory Visit: Payer: Self-pay | Admitting: Internal Medicine

## 2010-02-22 ENCOUNTER — Encounter (INDEPENDENT_AMBULATORY_CARE_PROVIDER_SITE_OTHER): Payer: Self-pay | Admitting: Internal Medicine

## 2010-02-22 ENCOUNTER — Inpatient Hospital Stay (HOSPITAL_COMMUNITY): Admission: AD | Admit: 2010-02-22 | Discharge: 2010-02-24 | Payer: Self-pay | Admitting: Internal Medicine

## 2010-02-22 ENCOUNTER — Ambulatory Visit: Payer: Self-pay | Admitting: Vascular Surgery

## 2010-02-22 DIAGNOSIS — R609 Edema, unspecified: Secondary | ICD-10-CM | POA: Insufficient documentation

## 2010-02-22 DIAGNOSIS — I8290 Acute embolism and thrombosis of unspecified vein: Secondary | ICD-10-CM | POA: Insufficient documentation

## 2010-02-23 ENCOUNTER — Telehealth: Payer: Self-pay | Admitting: *Deleted

## 2010-02-24 ENCOUNTER — Encounter: Payer: Self-pay | Admitting: Internal Medicine

## 2010-02-27 ENCOUNTER — Ambulatory Visit: Payer: Self-pay | Admitting: Internal Medicine

## 2010-02-27 DIAGNOSIS — G2581 Restless legs syndrome: Secondary | ICD-10-CM | POA: Insufficient documentation

## 2010-02-28 ENCOUNTER — Telehealth: Payer: Self-pay | Admitting: *Deleted

## 2010-03-01 ENCOUNTER — Encounter (INDEPENDENT_AMBULATORY_CARE_PROVIDER_SITE_OTHER): Payer: Self-pay | Admitting: Internal Medicine

## 2010-03-01 LAB — CONVERTED CEMR LAB: TSH: 15.105 microintl units/mL — ABNORMAL HIGH (ref 0.350–4.5)

## 2010-03-05 ENCOUNTER — Ambulatory Visit: Payer: Self-pay | Admitting: Infectious Diseases

## 2010-03-08 ENCOUNTER — Telehealth (INDEPENDENT_AMBULATORY_CARE_PROVIDER_SITE_OTHER): Payer: Self-pay | Admitting: Internal Medicine

## 2010-03-12 ENCOUNTER — Telehealth: Payer: Self-pay | Admitting: Licensed Clinical Social Worker

## 2010-03-12 ENCOUNTER — Ambulatory Visit: Payer: Self-pay | Admitting: Infectious Diseases

## 2010-03-12 ENCOUNTER — Ambulatory Visit (HOSPITAL_COMMUNITY): Admission: RE | Admit: 2010-03-12 | Discharge: 2010-03-12 | Payer: Self-pay | Admitting: Infectious Diseases

## 2010-03-12 DIAGNOSIS — S0990XA Unspecified injury of head, initial encounter: Secondary | ICD-10-CM | POA: Insufficient documentation

## 2010-03-12 LAB — CONVERTED CEMR LAB: INR: 1.8

## 2010-03-13 LAB — CONVERTED CEMR LAB
Free T4: 0.78 ng/dL — ABNORMAL LOW (ref 0.80–1.80)
TSH: 3.118 microintl units/mL (ref 0.350–4.5)

## 2010-03-15 ENCOUNTER — Telehealth: Payer: Self-pay | Admitting: Internal Medicine

## 2010-03-15 ENCOUNTER — Encounter: Payer: Self-pay | Admitting: Internal Medicine

## 2010-03-19 ENCOUNTER — Ambulatory Visit: Payer: Self-pay | Admitting: Internal Medicine

## 2010-03-19 LAB — CONVERTED CEMR LAB: INR: 2

## 2010-03-27 ENCOUNTER — Telehealth (INDEPENDENT_AMBULATORY_CARE_PROVIDER_SITE_OTHER): Payer: Self-pay | Admitting: Internal Medicine

## 2010-03-28 ENCOUNTER — Telehealth: Payer: Self-pay | Admitting: Licensed Clinical Social Worker

## 2010-04-02 ENCOUNTER — Ambulatory Visit: Payer: Self-pay | Admitting: Internal Medicine

## 2010-04-02 LAB — CONVERTED CEMR LAB
Alkaline Phosphatase: 63 units/L (ref 39–117)
BUN: 7 mg/dL (ref 6–23)
CO2: 23 meq/L (ref 19–32)
Creatinine, Ser: 0.61 mg/dL (ref 0.40–1.20)
Glucose, Bld: 83 mg/dL (ref 70–99)
Total Bilirubin: 0.3 mg/dL (ref 0.3–1.2)
Total Protein: 7 g/dL (ref 6.0–8.3)

## 2010-04-13 ENCOUNTER — Encounter (INDEPENDENT_AMBULATORY_CARE_PROVIDER_SITE_OTHER): Payer: Self-pay | Admitting: Internal Medicine

## 2010-04-16 ENCOUNTER — Telehealth (INDEPENDENT_AMBULATORY_CARE_PROVIDER_SITE_OTHER): Payer: Self-pay | Admitting: Internal Medicine

## 2010-04-18 ENCOUNTER — Encounter (INDEPENDENT_AMBULATORY_CARE_PROVIDER_SITE_OTHER): Payer: Self-pay | Admitting: Internal Medicine

## 2010-04-28 ENCOUNTER — Emergency Department (HOSPITAL_COMMUNITY): Admission: EM | Admit: 2010-04-28 | Discharge: 2010-04-29 | Payer: Self-pay | Admitting: Emergency Medicine

## 2010-04-30 ENCOUNTER — Encounter (INDEPENDENT_AMBULATORY_CARE_PROVIDER_SITE_OTHER): Payer: Self-pay | Admitting: Internal Medicine

## 2010-04-30 ENCOUNTER — Telehealth (INDEPENDENT_AMBULATORY_CARE_PROVIDER_SITE_OTHER): Payer: Self-pay | Admitting: Internal Medicine

## 2010-05-04 ENCOUNTER — Ambulatory Visit: Payer: Self-pay | Admitting: Internal Medicine

## 2010-05-04 ENCOUNTER — Encounter: Payer: Self-pay | Admitting: Pharmacist

## 2010-05-04 LAB — CONVERTED CEMR LAB
INR: 1.8
INR: 1.8

## 2010-05-07 ENCOUNTER — Ambulatory Visit: Payer: Self-pay | Admitting: Vascular Surgery

## 2010-05-14 ENCOUNTER — Telehealth (INDEPENDENT_AMBULATORY_CARE_PROVIDER_SITE_OTHER): Payer: Self-pay | Admitting: Internal Medicine

## 2010-05-17 ENCOUNTER — Encounter
Admission: RE | Admit: 2010-05-17 | Discharge: 2010-07-23 | Payer: Self-pay | Admitting: Physical Medicine & Rehabilitation

## 2010-05-22 ENCOUNTER — Ambulatory Visit: Payer: Self-pay | Admitting: Physical Medicine & Rehabilitation

## 2010-05-29 ENCOUNTER — Encounter (INDEPENDENT_AMBULATORY_CARE_PROVIDER_SITE_OTHER): Payer: Self-pay | Admitting: Internal Medicine

## 2010-06-01 ENCOUNTER — Telehealth (INDEPENDENT_AMBULATORY_CARE_PROVIDER_SITE_OTHER): Payer: Self-pay | Admitting: Internal Medicine

## 2010-06-01 ENCOUNTER — Ambulatory Visit: Payer: Self-pay | Admitting: Internal Medicine

## 2010-06-03 ENCOUNTER — Encounter (INDEPENDENT_AMBULATORY_CARE_PROVIDER_SITE_OTHER): Payer: Self-pay | Admitting: Internal Medicine

## 2010-06-07 ENCOUNTER — Telehealth (INDEPENDENT_AMBULATORY_CARE_PROVIDER_SITE_OTHER): Payer: Self-pay | Admitting: Internal Medicine

## 2010-06-07 ENCOUNTER — Encounter (INDEPENDENT_AMBULATORY_CARE_PROVIDER_SITE_OTHER): Payer: Self-pay | Admitting: Internal Medicine

## 2010-06-20 ENCOUNTER — Telehealth: Payer: Self-pay | Admitting: *Deleted

## 2010-06-22 ENCOUNTER — Ambulatory Visit: Payer: Self-pay | Admitting: Physical Medicine & Rehabilitation

## 2010-07-05 ENCOUNTER — Ambulatory Visit: Payer: Self-pay | Admitting: Internal Medicine

## 2010-07-05 DIAGNOSIS — G894 Chronic pain syndrome: Secondary | ICD-10-CM | POA: Insufficient documentation

## 2010-07-06 LAB — CONVERTED CEMR LAB
Cocaine Metabolites: NEGATIVE
Creatinine,U: 81.6 mg/dL
Opiates: NEGATIVE
Phencyclidine (PCP): NEGATIVE
Propoxyphene: NEGATIVE

## 2010-07-23 ENCOUNTER — Encounter: Payer: Self-pay | Admitting: Internal Medicine

## 2010-07-23 ENCOUNTER — Ambulatory Visit: Payer: Self-pay | Admitting: Physical Medicine & Rehabilitation

## 2010-08-04 LAB — URINALYSIS W/ REFLEX CULTURE
Bacteria: NEGATIVE /HPF
Blood: NEGATIVE
Glucose: NEGATIVE MG/DL
Ketone: NEGATIVE MG/DL
Leukocyte Esterase: NEGATIVE
Nitrites: NEGATIVE
Protein: NEGATIVE MG/DL
Specific gravity: 1.024 (ref 1.003–1.030)
Urobilinogen: 1 EU/DL (ref 0.2–1.0)
pH (UA): 5.5 (ref 5.0–8.0)

## 2010-08-04 LAB — HCG URINE, QL. - POC: Pregnancy test,urine (POC): NEGATIVE

## 2010-08-04 LAB — BILIRUBIN, CONFIRM: Bilirubin UA, confirm: NEGATIVE

## 2010-08-04 MED ADMIN — hydrocodone-acetaminophen (LORTAB) 5-325 mg per tablet 2 Tab: ORAL | NDC 00406036562

## 2010-08-04 NOTE — ED Provider Notes (Signed)
HPI Comments: 49 y/o AA female otherwise healthy presents to ED for evaluation of increased urinary frequency and low back/flank pain.  Patient states onset of symptoms 3 days ago.  Patient describes the sensation as pressure on her bladder.  No fevers/chills.  No nausea/vomiting.  No dysuria, urgency, gross hematuria noted.  Normal bowel movements, no diarrhea or constipation noted.  No abdominal pain noted.  No pelvic pain.  No vaginal discharge/bleeding noted.  Low back pain does radiate towards bilateral flanks.  No lower extremity weakness noted.  No saddle anesthesia noted.  No bowel/bladder incontinence or urinary retention noted.  No other medical complaints expressed at this time.      Patient is a 49 y.o. female presenting with back pain. The history is provided by the patient.   Back Pain   Pertinent negatives include no chest pain, no fever, no numbness, no headaches, no abdominal pain, no dysuria, no pelvic pain and no weakness.        No past medical history on file.     No past surgical history on file.      No family history on file.     History   Social History   ??? Marital Status: Single     Spouse Name: N/A     Number of Children: N/A   ??? Years of Education: N/A   Occupational History   ??? Not on file.   Social History Main Topics   ??? Smoking status: Current Everyday Smoker   ??? Smokeless tobacco: Not on file   ??? Alcohol Use: Yes   ??? Drug Use: No   ??? Sexually Active:    Other Topics Concern   ??? Not on file   Social History Narrative   ??? No narrative on file                    ALLERGIES: Review of patient's allergies indicates no known allergies.      Review of Systems   Constitutional: Negative for fever and chills.   HENT: Negative for congestion, sore throat, rhinorrhea, trouble swallowing, neck pain and neck stiffness.    Respiratory: Negative for cough, shortness of breath and wheezing.    Cardiovascular: Negative for chest pain and leg swelling.    Gastrointestinal: Negative for nausea, vomiting and abdominal pain.   Genitourinary: Positive for frequency and flank pain. Negative for dysuria, urgency, hematuria, vaginal bleeding, vaginal discharge and pelvic pain.   Musculoskeletal: Positive for back pain. Negative for gait problem.   Skin: Negative for rash and wound.   Neurological: Negative for dizziness, weakness, light-headedness, numbness and headaches.   Hematological: Does not bruise/bleed easily.   All other systems reviewed and are negative.        Filed Vitals:    08/04/10 1735 08/04/10 2039   BP: 119/68 121/72   Pulse: 96 89   Temp: 98.4 ??F (36.9 ??C) 98.5 ??F (36.9 ??C)   Resp: 18 16   Height: 5\' 1"  (1.549 m)    Weight: 170 lb (77.111 kg)    SpO2: 100% 100%              Physical Exam   Nursing note and vitals reviewed.  Constitutional: She is oriented to person, place, and time. She appears well-developed and well-nourished. No distress.   HENT:   Head: Normocephalic and atraumatic.   Right Ear: External ear normal.   Left Ear: External ear normal.   Nose: Nose normal.  Mouth/Throat: Oropharynx is clear and moist. No oropharyngeal exudate.   Eyes: EOM are normal. Pupils are equal, round, and reactive to light.   Neck: Normal range of motion. Neck supple.   Cardiovascular: Normal rate, regular rhythm, normal heart sounds and intact distal pulses.    No murmur heard.  Pulmonary/Chest: Effort normal and breath sounds normal. No respiratory distress. She has no wheezes. She has no rales. She exhibits no tenderness.   Abdominal: Soft. Normal appearance and bowel sounds are normal. She exhibits no distension, no abdominal bruit, no ascites, no pulsatile midline mass and no mass. There is no hepatosplenomegaly. No tenderness. She has no rigidity, no rebound, no guarding, no CVA tenderness, no pain at McBurney's point and no Murphy's sign. No hernia.        Abdomen exposed during exam    Musculoskeletal: Normal range of motion. She exhibits no edema and no tenderness.   Lymphadenopathy:     She has no cervical adenopathy.   Neurological: She is alert and oriented to person, place, and time. Coordination normal.   Skin: Skin is warm and dry. No rash noted. She is not diaphoretic. No erythema.   Psychiatric: She has a normal mood and affect. Her behavior is normal.        MDM    Procedures    Discussed patient including complaint, history, physical exam, test results and diagnosis and plan including treatment with attending physician.  Attending agrees with care and plan.  Discussed with Dr. Bryan Lemma, PA-C    Progress Note:  Patient feeling better after Lortab.  Reviewed lab and imaging results with patient.  Will d/c home with pain medication and PCP follow up  Marylee Floras, PA-C    Reviewed dx with patient.  Patient verbalizes understanding of dx and aware of what s/sx to monitor that would warrant return visit to ED  Marylee Floras, PA-C

## 2010-08-04 NOTE — ED Provider Notes (Signed)
I was personally available for consultation in the emergency department.  I have reviewed the chart and agree with the documentation recorded by the MLP, including the assessment, treatment plan, and disposition.  Adiana Smelcer I Aydrien Froman, MD

## 2010-08-04 NOTE — ED Notes (Signed)
The patient was given discharge instructions and questions were answered. Patient is in no apparent distress at time of discharge.   She was ambulatory to the waiting area with her friend who will drive her home.

## 2010-08-04 NOTE — ED Notes (Signed)
Lower back pain with frequent urination onset 3 days ago.  "It feels like pressure in my bladder."

## 2010-08-05 MED ORDER — HYDROCODONE-ACETAMINOPHEN 5 MG-500 MG TAB
5-500 mg | ORAL_TABLET | Freq: Four times a day (QID) | ORAL | Status: DC | PRN
Start: 2010-08-05 — End: 2010-12-31

## 2010-08-06 LAB — CULTURE, URINE
Colonies Counted: <1000
Colony Count: <1000
Culture result:: NO GROWTH
Culture: NO GROWTH

## 2010-08-08 ENCOUNTER — Ambulatory Visit: Payer: Self-pay | Admitting: Internal Medicine

## 2010-08-08 LAB — CONVERTED CEMR LAB: Herpes Simplex Vrs I&II-IgM Ab (EIA): 0.83

## 2010-08-09 LAB — CONVERTED CEMR LAB: Prothrombin Time: 25.6 s — ABNORMAL HIGH (ref 11.6–15.2)

## 2010-08-15 LAB — CONVERTED CEMR LAB
Albumin: 4.5 g/dL (ref 3.5–5.2)
Alkaline Phosphatase: 76 units/L (ref 39–117)
Amphetamine Screen, Ur: NEGATIVE
BUN: 8 mg/dL (ref 6–23)
Calcium: 9 mg/dL (ref 8.4–10.5)
Chlamydia, Swab/Urine, PCR: NEGATIVE
Chloride: 108 meq/L (ref 96–112)
Cocaine Metabolites: NEGATIVE
Creatinine, Ser: 0.73 mg/dL (ref 0.40–1.20)
Creatinine,U: 116.9 mg/dL
Glucose, Bld: 102 mg/dL — ABNORMAL HIGH (ref 70–99)
HDL: 30 mg/dL — ABNORMAL LOW (ref >39)
Phencyclidine (PCP): NEGATIVE
Potassium: 3.8 meq/L (ref 3.5–5.3)
Total CHOL/HDL Ratio: 4
Triglycerides: 145 mg/dL (ref <150)

## 2010-08-17 ENCOUNTER — Telehealth: Payer: Self-pay | Admitting: Internal Medicine

## 2010-08-29 ENCOUNTER — Telehealth: Payer: Self-pay | Admitting: *Deleted

## 2010-09-11 ENCOUNTER — Ambulatory Visit: Payer: Self-pay | Admitting: Internal Medicine

## 2010-09-11 ENCOUNTER — Encounter: Payer: Self-pay | Admitting: Licensed Clinical Social Worker

## 2010-09-11 DIAGNOSIS — M79609 Pain in unspecified limb: Secondary | ICD-10-CM | POA: Insufficient documentation

## 2010-09-20 LAB — CONVERTED CEMR LAB
Amphetamine Screen, Ur: NEGATIVE
Benzodiazepines.: NEGATIVE
Cocaine Metabolites: NEGATIVE
Methadone: NEGATIVE
Phencyclidine (PCP): NEGATIVE

## 2010-10-09 ENCOUNTER — Encounter: Payer: Self-pay | Admitting: Internal Medicine

## 2010-10-09 ENCOUNTER — Ambulatory Visit: Payer: Self-pay | Admitting: Internal Medicine

## 2010-10-22 ENCOUNTER — Encounter: Payer: Self-pay | Admitting: Internal Medicine

## 2010-10-23 ENCOUNTER — Telehealth: Payer: Self-pay | Admitting: Internal Medicine

## 2010-11-06 ENCOUNTER — Ambulatory Visit: Payer: Self-pay | Admitting: Internal Medicine

## 2010-11-06 LAB — CONVERTED CEMR LAB
Amphetamine Screen, Ur: NEGATIVE
Benzodiazepines.: NEGATIVE
Cocaine Metabolites: NEGATIVE
Marijuana Metabolite: NEGATIVE
Phencyclidine (PCP): NEGATIVE
Propoxyphene: NEGATIVE

## 2010-11-12 ENCOUNTER — Encounter: Payer: Self-pay | Admitting: Internal Medicine

## 2010-12-18 ENCOUNTER — Ambulatory Visit: Payer: Self-pay | Admitting: Internal Medicine

## 2010-12-31 LAB — METABOLIC PANEL, COMPREHENSIVE
A-G Ratio: 1 — ABNORMAL LOW (ref 1.1–2.2)
ALT (SGPT): 35 U/L (ref 12–78)
AST (SGOT): 23 U/L (ref 15–37)
Albumin: 3.3 g/dL — ABNORMAL LOW (ref 3.5–5.0)
Alk. phosphatase: 57 U/L (ref 50–136)
Anion gap: 7 mmol/L (ref 5–15)
BUN/Creatinine ratio: 9 — ABNORMAL LOW (ref 12–20)
BUN: 9 MG/DL (ref 6–20)
Bilirubin, total: 0.3 MG/DL (ref 0.2–1.0)
CO2: 31 MMOL/L (ref 21–32)
Calcium: 8.2 MG/DL — ABNORMAL LOW (ref 8.5–10.1)
Chloride: 105 MMOL/L (ref 97–108)
Creatinine: 1 MG/DL (ref 0.6–1.3)
GFR est AA: >60 mL/min/{1.73_m2} (ref >60)
GFR est non-AA: >60 mL/min/{1.73_m2} (ref >60)
Globulin: 3.4 g/dL (ref 2.0–4.0)
Glucose: 90 MG/DL (ref 65–100)
Potassium: 2.7 MMOL/L — CL (ref 3.5–5.1)
Protein, total: 6.7 g/dL (ref 6.4–8.2)
Sodium: 143 MMOL/L (ref 136–145)

## 2010-12-31 LAB — CBC WITH AUTOMATED DIFF
ABS. BASOPHILS: 0 10*3/uL (ref 0.0–0.1)
ABS. EOSINOPHILS: 0.2 10*3/uL (ref 0.0–0.4)
ABS. LYMPHOCYTES: 1.8 10*3/uL (ref 0.8–3.5)
ABS. MONOCYTES: 0.7 10*3/uL (ref 0.0–1.0)
ABS. NEUTROPHILS: 5.6 10*3/uL (ref 1.8–8.0)
BASOPHILS: 0 % (ref 0–1)
EOSINOPHILS: 3 % (ref 0–7)
HCT: 37.1 % (ref 35.0–47.0)
HGB: 12.2 g/dL (ref 11.5–16.0)
LYMPHOCYTES: 21 % (ref 12–49)
MCH: 31.1 PG (ref 26.0–34.0)
MCHC: 32.9 g/dL (ref 30.0–36.5)
MCV: 94.6 FL (ref 80.0–99.0)
MONOCYTES: 9 % (ref 5–13)
NEUTROPHILS: 67 % (ref 32–75)
PLATELET: 350 10*3/uL (ref 150–400)
RBC: 3.92 M/uL (ref 3.80–5.20)
RDW: 14.4 % (ref 11.5–14.5)
WBC: 8.3 10*3/uL (ref 3.6–11.0)

## 2010-12-31 MED ORDER — TRIMETHOPRIM-SULFAMETHOXAZOLE 160 MG-800 MG TAB
160-800 mg | ORAL | Status: AC
Start: 2010-12-31 — End: 2010-12-31
  Administered 2010-12-31: 20:00:00 via ORAL

## 2010-12-31 MED ORDER — TRIMETHOPRIM-SULFAMETHOXAZOLE 160 MG-800 MG TAB
160-800 mg | ORAL_TABLET | Freq: Two times a day (BID) | ORAL | Status: AC
Start: 2010-12-31 — End: 2011-01-10

## 2010-12-31 MED ORDER — HYDROCODONE-ACETAMINOPHEN 5 MG-500 MG TAB
5-500 mg | ORAL_TABLET | ORAL | Status: AC | PRN
Start: 2010-12-31 — End: 2011-01-07

## 2010-12-31 MED ORDER — HYDROCODONE-ACETAMINOPHEN 5 MG-325 MG TAB
5-325 mg | ORAL | Status: AC
Start: 2010-12-31 — End: 2010-12-31
  Administered 2010-12-31: 20:00:00 via ORAL

## 2010-12-31 MED ORDER — POTASSIUM CHLORIDE SR 10 MEQ TAB
10 mEq | ORAL | Status: AC
Start: 2010-12-31 — End: 2010-12-31
  Administered 2010-12-31: 21:00:00 via ORAL

## 2010-12-31 MED FILL — TRIMETHOPRIM-SULFAMETHOXAZOLE 160 MG-800 MG TAB: 160-800 mg | ORAL | Qty: 1

## 2010-12-31 MED FILL — K-TAB 10 MEQ TABLET,EXTENDED RELEASE: 10 mEq | ORAL | Qty: 4

## 2010-12-31 MED FILL — HYDROCODONE-ACETAMINOPHEN 5 MG-325 MG TAB: 5-325 mg | ORAL | Qty: 2
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
## 2011-01-18 ENCOUNTER — Encounter: Payer: Self-pay | Admitting: Internal Medicine

## 2011-01-18 ENCOUNTER — Inpatient Hospital Stay (HOSPITAL_COMMUNITY)
Admission: AD | Admit: 2011-01-18 | Discharge: 2011-01-20 | Payer: Self-pay | Source: Home / Self Care | Attending: Internal Medicine | Admitting: Internal Medicine

## 2011-01-18 ENCOUNTER — Ambulatory Visit: Admission: RE | Admit: 2011-01-18 | Discharge: 2011-01-18 | Payer: Self-pay | Source: Home / Self Care

## 2011-01-18 DIAGNOSIS — R300 Dysuria: Secondary | ICD-10-CM | POA: Insufficient documentation

## 2011-01-20 ENCOUNTER — Encounter: Payer: Self-pay | Admitting: Internal Medicine

## 2011-01-20 ENCOUNTER — Encounter: Payer: Self-pay | Admitting: Dermatology

## 2011-01-20 ENCOUNTER — Encounter: Payer: Self-pay | Admitting: Surgery

## 2011-01-20 DIAGNOSIS — Z7901 Long term (current) use of anticoagulants: Secondary | ICD-10-CM

## 2011-01-20 DIAGNOSIS — I82409 Acute embolism and thrombosis of unspecified deep veins of unspecified lower extremity: Secondary | ICD-10-CM

## 2011-01-21 DIAGNOSIS — R4182 Altered mental status, unspecified: Secondary | ICD-10-CM | POA: Insufficient documentation

## 2011-01-21 LAB — COMPREHENSIVE METABOLIC PANEL
AST: 16 U/L (ref 0–37)
BUN: 6 mg/dL (ref 6–23)
CO2: 25 mEq/L (ref 19–32)
Calcium: 8.6 mg/dL (ref 8.4–10.5)
Creatinine, Ser: 0.84 mg/dL (ref 0.4–1.2)
GFR calc Af Amer: >60 mL/min (ref >60)
GFR calc non Af Amer: >60 mL/min (ref >60)
Total Bilirubin: 0.3 mg/dL (ref 0.3–1.2)

## 2011-01-21 LAB — URINALYSIS, ROUTINE W REFLEX MICROSCOPIC
Bilirubin Urine: NEGATIVE
Ketones, ur: NEGATIVE mg/dL
Nitrite: NEGATIVE
Protein, ur: NEGATIVE mg/dL
Urobilinogen, UA: 1 mg/dL (ref 0.0–1.0)
pH: 6 (ref 5.0–8.0)

## 2011-01-21 LAB — PROTIME-INR
INR: 0.99 (ref 0.00–1.49)
Prothrombin Time: 13.3 seconds (ref 11.6–15.2)

## 2011-01-21 LAB — CBC
HCT: 37 % (ref 36.0–46.0)
Hemoglobin: 12.2 g/dL (ref 12.0–15.0)
MCH: 31.9 pg (ref 26.0–34.0)
MCHC: 33 g/dL (ref 30.0–36.0)
MCV: 96.6 fL (ref 78.0–100.0)
RDW: 12.8 % (ref 11.5–15.5)

## 2011-01-21 LAB — DIFFERENTIAL
Basophils Absolute: 0.1 10*3/uL (ref 0.0–0.1)
Eosinophils Relative: 9 % — ABNORMAL HIGH (ref 0–5)
Lymphocytes Relative: 36 % (ref 12–46)
Lymphs Abs: 3.3 10*3/uL (ref 0.7–4.0)
Monocytes Absolute: 0.8 10*3/uL (ref 0.1–1.0)
Monocytes Relative: 8 % (ref 3–12)
Neutro Abs: 4.3 10*3/uL (ref 1.7–7.7)

## 2011-01-21 LAB — URINE MICROSCOPIC-ADD ON

## 2011-01-22 LAB — CBC
HCT: 34.6 % — ABNORMAL LOW (ref 36.0–46.0)
Hemoglobin: 12.1 g/dL (ref 12.0–15.0)
MCH: 28.4 pg (ref 26.0–34.0)
MCV: 85.4 fL (ref 78.0–100.0)
Platelets: 152 10*3/uL (ref 150–400)
Platelets: 285 10*3/uL (ref 150–400)
RBC: 3.82 MIL/uL — ABNORMAL LOW (ref 3.87–5.11)
RDW: 14.4 % (ref 11.5–15.5)
WBC: 9.4 10*3/uL (ref 4.0–10.5)
WBC: 7.6 10*3/uL (ref 4.0–10.5)

## 2011-01-22 LAB — BASIC METABOLIC PANEL
BUN: 11 mg/dL (ref 6–23)
CO2: 25 mEq/L (ref 19–32)
Chloride: 108 mEq/L (ref 96–112)
Creatinine, Ser: 1.13 mg/dL (ref 0.4–1.2)
GFR calc Af Amer: >60 mL/min (ref >60)
GFR calc non Af Amer: 51 mL/min — ABNORMAL LOW (ref >60)
Glucose, Bld: 172 mg/dL — ABNORMAL HIGH (ref 70–99)
Potassium: 3.9 mEq/L (ref 3.5–5.1)
Potassium: 4.1 mEq/L (ref 3.5–5.1)

## 2011-01-22 LAB — URINE CULTURE: Culture  Setup Time: 201201201821

## 2011-01-22 LAB — LIPID PANEL
HDL: 52 mg/dL (ref >39)
Total CHOL/HDL Ratio: 2.9 RATIO

## 2011-01-24 ENCOUNTER — Ambulatory Visit: Admission: RE | Admit: 2011-01-24 | Discharge: 2011-01-24 | Payer: Self-pay | Source: Home / Self Care

## 2011-01-24 LAB — CONVERTED CEMR LAB
CO2: 29 meq/L (ref 19–32)
Calcium: 9.5 mg/dL (ref 8.4–10.5)
Eosinophils Relative: 9 % — ABNORMAL HIGH (ref 0–5)
Glucose, Bld: 95 mg/dL (ref 70–99)
HCT: 40.2 % (ref 36.0–46.0)
INR: 3.2
Lymphocytes Relative: 35 % (ref 12–46)
Lymphs Abs: 2.8 10*3/uL (ref 0.7–4.0)
Platelets: 275 10*3/uL (ref 150–400)
Potassium: 4.3 meq/L (ref 3.5–5.3)
RBC: 4.14 M/uL (ref 3.87–5.11)
Sodium: 139 meq/L (ref 135–145)
WBC: 8.2 10*3/uL (ref 4.0–10.5)

## 2011-01-25 LAB — CULTURE, BLOOD (ROUTINE X 2)
Culture  Setup Time: 201201210239
Culture: NO GROWTH

## 2011-01-28 ENCOUNTER — Ambulatory Visit: Admission: RE | Admit: 2011-01-28 | Discharge: 2011-01-28 | Payer: Self-pay | Source: Home / Self Care

## 2011-01-29 NOTE — Assessment & Plan Note (Signed)
Summary: EST-CK/FU/MEDS/CFB   Vital Signs:  Patient profile:   50 year old female Height:      64 inches (162.56 cm) Weight:      239.7 pounds (108.95 kg) BMI:     41.29 Temp:     97.6 degrees F (36.44 degrees C) oral Pulse rate:   77 / minute BP sitting:   132 / 95  (left arm) Cuff size:   large  Vitals Entered By: [name]) (September 11, 2010 9:14 AM)  Does patient need assistance? Functional Status Self care Ambulation Impaired:Risk for fall Comments cane   Primary Care Kitai Purdom:  Zoila Shutter MD   History of Present Illness: 50 year old who comes in for 1 month follow up. At the beginning of the visit it seems like she is doing o.k. Her legs are doing better and she is not wearing an ace on her left leg. I have told her that she can stop her coumadin today as she has been on it more than 6 months for her DVT and she is glad. We discussed that all her STD's were -. She was worried about the herpes and I told her those were just anti-bodies from old infections.  However shortly after this she starts talking about the fact that she is not doing well at all from an emotional standpoint and says she almost had herself admitted to a psychiatric facility. She says she hears voices and has thoughts, and when I said she is "killing herself inside" she said that was exactly what is happening. Her "nerves are out of wack, and she is doing terrible." However when I told her that I was concerned about her waiting until 9/20 for her to be seen as an out-pt. at [facility], she got very anxious and said if she went in then she would't have any home to come to when she got out. This is because she is concerned that her significant other cannot be on his own.  I did have social work talk to her about this situation but she refused to do anything different. I do not think she is a danger to herself or others.  Her drug screen was negative for benzos and opiates last time  though she said she was on them. She specifically says she is taking all 3 now.  Current Medications (verified): 1)  Hydrochlorothiazide 25 Mg Tabs (Hydrochlorothiazide) .... Take 1 Tablet By Mouth Once A Day 2)  Lisinopril 40 Mg  Tabs (Lisinopril) .... Take 1 Tablet By Mouth Once A Day 3)  Aspirin 81 Mg Tabs (Aspirin) .... Take 1 Tablet By Mouth Once A Day 4)  Coreg 25 Mg Tabs (Carvedilol) .... Take 1 Pill By Mouth Two Times A Day. 5)  Lipitor 40 Mg Tabs (Atorvastatin Calcium) .... Take 1 Pill By Mouth Daily. 6)  Proair Hfa 108 (90 Base) Mcg/act Aers (Albuterol Sulfate) .... Take 1-2 Puffs As Needed Every 4-6 Hours As Needed For Shortness of Breath 7)  Klonopin 1 Mg Tabs (Clonazepam) .... Take 1 Pill By Mouth Three Times A Day. 8)  Niacin Cr 1000 Mg Cr-Tabs (Niacin) .... Take 1 Pill By Mouth Daily. Take Aspirin 81 Mg 30 Minutes Before Taking Niacin. 9)  Neurontin 800 Mg Tabs (Gabapentin) .... Take 1 Pill By Mouth Three Times A Day. 10)  Norvasc 10 Mg Tabs (Amlodipine Besylate) .... Take 1 Pill By Mouth Daily. 11)  Promethazine Hcl 25 Mg Tabs (Promethazine Hcl) .... Take 1  Pill By Mouth Three Times A Day As Needed For Vomiting. 12)  Robitussin Dm 100-10 Mg/59ml Syrp (Dextromethorphan-Guaifenesin) .... Take 10 Cc By Mouth Three Times A Day As Needed For Cough. 13)  Alprazolam 0.5 Mg Tabs (Alprazolam) .... Take 1 Pill By Mouth Three Times A Day As Needed For Anxiety. 14)  Cymbalta 60 Mg Cpep (Duloxetine Hcl) .... One 2 Times A Day 15)  Coumadin 5 Mg Tabs (Warfarin Sodium) .... Take 1 Tablet By Mouth Once A Day 16)  Percocet 5-325 Mg Tabs (Oxycodone-Acetaminophen) .... Take 1 Pill By Mouth As Needed Up To Every 6 Hourly. 17)  Geodon 60 Mg Caps (Ziprasidone Hcl) .... One 2 Times A Day 18)  Mirtazapine 15 Mg Tabs (Mirtazapine) .... 1/2 At Night 19)  Zolpidem Tartrate 10 Mg Tabs (Zolpidem Tartrate) .Marland Kitchen.. 1 At Night As Needed 20)  Seroquel 100 Mg Tabs (Quetiapine Fumarate) .... 1/2-1 At Night As  Needed  Allergies: 1)  ! * Darvocet 2)  ! * Strawberries 3)  ! * Cats 4)  ! * Gin  Past History:  Past Medical History: Reviewed history from 02/27/2009 and no changes required. Hypertension Peripheral vascular disease w/ intermittent claudication      -s/p L fem-pop bypass 11/21/08           graft occluded 12/28/08      -aortogram w/ bilat LE runoff           1.  bilat diffuse SFA occlusive dz           2.  Mod to severe above-knee popliteal dz.           3.  Bilat 3-vessel runoff w/ mild tibial occlusive dz. 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  very anxious and labile mood Head:  normocephalic and atraumatic.   Eyes:  vision grossly intact.   Neck:  supple, full ROM, and no masses.   Lungs:  normal respiratory effort and normal breath sounds.   Heart:  normal rate, regular rhythm, and no murmur.   Abdomen:  soft, non-tender, and normal bowel sounds.   Extremities:  both LE with less swelling, still diffuse tenderness in LLE but less Psych:  Oriented X3, labile affect, tearful, and severely anxious.     Impression & Recommendations:  Problem # 1:  CHRONIC PAIN SYNDROME (ICD-338.4) Percocet refilled. UDS collected today. 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 Patient voices understanding and is happy about this.  Problem # 3:   HYPERTENSION (ICD-401.9) BP up some today as she was upset but will continue present regimen. Her updated medication list for this problem includes:    Hydrochlorothiazide 25 Mg Tabs (Hydrochlorothiazide) .Marland Kitchen... Take 1 tablet by mouth once a day    Lisinopril 40 Mg Tabs (Lisinopril) .Marland Kitchen... Take 1 tablet by mouth once a day    Coreg 25 Mg Tabs (Carvedilol) .Marland Kitchen... Take 1  pill by mouth two times a day.    Norvasc 10 Mg Tabs (Amlodipine besylate) .Marland Kitchen... Take 1 pill by mouth daily.  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 She has her appt. scheduled 9/20 and promises to go see them sooner if needed. Her updated medication list for this problem includes:    Klonopin 1 Mg Tabs (Clonazepam) .Marland Kitchen... Take 1 pill by mouth three times a day.    Alprazolam 0.5 Mg Tabs (Alprazolam) .Marland Kitchen... Take 1 pill by mouth three times a day as needed for anxiety.    Cymbalta 60 Mg Cpep (Duloxetine hcl) ..... One 2 times a day    Mirtazapine 15 Mg Tabs (Mirtazapine) .Marland Kitchen... 1/2 at night  Problem # 5:  PERIPHERAL VASCULAR DISEASE (ICD-443.9) Patient continues to complain of leg pain that is bilateral. It may be PVD but I am going to refer her to PT and see if that will help. I think that some of this may be deconditioning.  Problem # 6:  Preventive Health Care (ICD-V70.0) Flu shot given today.  Complete Medication List: 1)  Hydrochlorothiazide 25 Mg Tabs (Hydrochlorothiazide) .... Take 1 tablet by mouth once a day 2)  Lisinopril 40 Mg Tabs (Lisinopril) .... Take 1 tablet by mouth once a day 3)  Aspirin 81 Mg Tabs (Aspirin) .... Take 1 tablet by mouth once a day 4)  Coreg 25 Mg Tabs (Carvedilol) .... Take 1 pill by mouth two times a day. 5)  Lipitor 40 Mg Tabs (Atorvastatin calcium) .... Take 1 pill by mouth daily. 6)  Proair Hfa 108 (90 Base) Mcg/act Aers (Albuterol sulfate) .... Take 1-2 puffs as needed every 4-6 hours as needed for  shortness of breath 7)  Klonopin 1 Mg Tabs (Clonazepam) .... Take 1 pill by mouth three times a day. 8)  Niacin Cr 1000 Mg Cr-tabs (Niacin) .... Take 1 pill by mouth daily. take aspirin 81 mg 30 minutes before taking niacin. 9)  Neurontin 800 Mg Tabs (Gabapentin) .... Take 1 pill by mouth three times a day. 10)  Norvasc 10 Mg Tabs (Amlodipine besylate) .... Take 1 pill by mouth daily. 11)  Promethazine Hcl 25 Mg Tabs (Promethazine hcl) .... Take 1 pill by mouth three times a day as needed for vomiting. 12)  Robitussin Dm 100-10 Mg/61ml Syrp (Dextromethorphan-guaifenesin) .... Take 10 cc by mouth three times a day as needed for cough. 13)  Alprazolam 0.5 Mg Tabs (Alprazolam) .... Take 1 pill by mouth three times a day as needed for anxiety. 14)  Cymbalta 60 Mg Cpep (Duloxetine hcl) .... One 2 times a day 15)  Coumadin 5 Mg Tabs (Warfarin sodium) .... Take 1 tablet by mouth once a day 16)  Percocet 5-325 Mg Tabs (Oxycodone-acetaminophen) .... Take 1 pill by mouth as needed up to every 6 hourly. 17)  Geodon 60 Mg Caps (Ziprasidone hcl) .... One 2 times a day 18)  Mirtazapine 15 Mg Tabs (Mirtazapine) .... 1/2 at night 19)  Zolpidem Tartrate 10 Mg Tabs (Zolpidem tartrate) .Marland Kitchen.. 1 at night as needed 20)  Seroquel 100 Mg Tabs (Quetiapine fumarate) .... 1/2-1 at night as needed  Other Orders: Physical Therapy Referral (PT) Physical Therapy Referral (PT)  Patient Instructions: 1)  Please schedule a follow-up appointment in 1 month. 2)  Please [facility] with the [facility]. Prescriptions: PERCOCET 5-325 MG TABS (OXYCODONE-ACETAMINOPHEN) take 1 pill by mouth as needed up to every 6  hourly.  #120 x 0   Entered and Authorized by:   Zoila Shutter MD   Signed by:   Zoila Shutter MD on 09/11/2010   Method used:   Print then Give to Patient   RxID:   [id number] ALPRAZOLAM 0.5 MG TABS (ALPRAZOLAM) take 1 pill by mouth three times a day as needed for anxiety.  #60 x 3   Entered  and Authorized by:   Zoila Shutter MD   Signed by:   Zoila Shutter MD on 09/11/2010   Method used:   Print then Give to Patient   RxID:   [id number]  Process Orders Check Orders Results:     [facility]: ABN not required for this insurance Tests Sent for requisitioning (September 11, 2010 4:20 PM):     09/11/2010: [facility] -- T-Drug Screen-Urine, (single) [[id number]] (signed)      Flu Vaccine Consent Questions:    Do you have a history of severe allergic reactions to this vaccine? no    Any prior history of allergic reactions to egg and/or gelatin? no    Do you have a sensitivity to the preservative Thimersol? no    Do you have a past history of Guillan-Barre Syndrome? no    Do you currently have an acute febrile illness? no    Have you ever had a severe reaction to latex? no    Vaccine information given and explained to patient? yes    Are you currently pregnant? no   Prevention & Chronic Care Immunizations   Influenza vaccine: Fluvax 3+  (09/25/2009)   Influenza vaccine deferral: Deferred  (04/02/2010)    Tetanus booster: 01/12/2010: Tdap   Td booster deferral: Not indicated  (07/05/2010)    Pneumococcal vaccine: Not documented   Pneumococcal vaccine deferral: Not indicated  (07/05/2010)  Other Screening   Pap smear: Not documented   Pap smear action/deferral: GYN Referral  (04/02/2010)    Mammogram: Assessment: BIRADS 4. Suspicious.  IMPRESSION: 1.  Lobulated heterogeneous solid and cystic mass 10 o'clock right  breast measuring approximately  4.6 cm in greatest dimension is indeterminate.  Malignancy is not  excluded.  Ultrasound-guided core needle biopsy is recommended. 2.  Prominent lymph node in axillary tail of the right breast.   Particularly in light of the  findings in the 10 o'clock region of the right breast,  ultrasound-guided core needle biopsy of this lymph node is suggested at the time of  biopsy. 3.  No evidence of malignancy in the left breast.   Recommend ultrasound-guided core needle biopsy of palpable right  breast mass and prominent lymph  node in the axillary tail of the right breast.  (03/10/2008)   Mammogram action/deferral: Ordered  (08/08/2010)   Smoking status: current  (08/08/2010)   Smoking cessation counseling: yes  (08/08/2010)  Lipids   Total Cholesterol: 121  (08/08/2010)   Lipid panel action/deferral: Lipid Panel ordered   LDL: 62  (08/08/2010)   LDL Direct: Not documented   HDL: 30  (08/08/2010)   Triglycerides: 145  (08/08/2010)    SGOT (AST): 14  (08/08/2010)   BMP action: Ordered   SGPT (ALT): 16  (08/08/2010)   Alkaline phosphatase: 76  (08/08/2010)   Total bilirubin: 0.3  (08/08/2010)  Hypertension   Last Blood Pressure: 132 / 95  (09/11/2010)   Serum creatinine: 0.73  (08/08/2010)   BMP action: Ordered   Serum potassium 3.8  (08/08/2010)  Self-Management Support :   Personal  Goals (by the next clinic visit) :      Personal blood pressure goal: 130/80  (09/25/2009)     Personal LDL goal: 70  (09/25/2009)    Hypertension self-management support: Pre-printed educational material, Resources for patients handout, Written self-care plan  (07/05/2010)    Lipid self-management support: Pre-printed educational material, Resources for patients handout, Written self-care plan  (07/05/2010)    Nursing Instructions: Give Flu vaccine today    Process Orders Check Orders Results:     [facility]: ABN not required for this insurance Tests Sent for requisitioning (September 11, 2010 4:20 PM):     09/11/2010: [facility] -- T-Drug Screen-Urine, (single) [[id number]] (signed)

## 2011-01-29 NOTE — Progress Notes (Signed)
Summary: Refill/gh  Phone Note Refill Request Message from:  Fax from Pharmacy on January 26, 2010 3:22 PM  Refills Requested: Medication #1:  NORVASC 5 MG TABS take 1 pill by mouth daily.   Last Refilled: 12/12/2009  Method Requested: Fax to Local Pharmacy Initial call taken by: Angelina Ok RN,  January 26, 2010 3:22 PM  Follow-up for Phone Call       Follow-up by: Jason Coop MD,  January 26, 2010 7:38 PM    Prescriptions: NORVASC 5 MG TABS (AMLODIPINE BESYLATE) take 1 pill by mouth daily.  #30 x 11   Entered by:   Jason Coop MD   Authorized by:   Marland Kitchen [facility] ATTENDING DESKTOP   Signed by:   Jason Coop MD on 01/26/2010   Method used:   Faxed to ...       [facility] (retail)       [address] [place], [place]  [address]       Ph: [phone]       Fax: [phone]   RxID:   [id number]

## 2011-01-29 NOTE — Progress Notes (Signed)
Summary: refill/gg  Phone Note Refill Request  on August 17, 2010 3:46 PM  Refills Requested: Medication #1:  PROAIR HFA 108 (90 BASE) MCG/ACT AERS Take 1-2 puffs as needed every 4-6 hours as needed for shortness of breath   Last Refilled: 01/17/2010  Medication #2:  COREG 25 MG TABS take 1 pill by mouth two times a day.   Last Refilled: 04/04/2010  Method Requested: Fax to Local Pharmacy Initial call taken by: Merrie Roof RN,  August 17, 2010 3:46 PM    Prescriptions: PROAIR HFA 108 (90 BASE) MCG/ACT AERS (ALBUTEROL SULFATE) Take 1-2 puffs as needed every 4-6 hours as needed for shortness of breath  #1 inhaler x 6   Entered and Authorized by:   Zoila Shutter MD   Signed by:   Zoila Shutter MD on 08/20/2010   Method used:   Faxed to ...       [facility] (retail)       [address]       [place], [place]  [address]       Ph: [phone]       Fax: [phone]   RxID:   [id number] COREG 25 MG TABS (CARVEDILOL) take 1 pill by mouth two times a day.  #60 x 11   Entered and Authorized by:   Zoila Shutter MD   Signed by:   Zoila Shutter MD on 08/20/2010   Method used:   Faxed to ...       [facility] (retail)       [address]       [place], [place]  [address]       Ph: [phone]       Fax: [phone]   RxID:   [id number]

## 2011-01-29 NOTE — Miscellaneous (Signed)
Summary: [employer]   [employer]   Imported By: Margie Billet 11/01/2010 10:40:24  _____________________________________________________________________  External Attachment:    Type:   Image     Comment:   External Document

## 2011-01-29 NOTE — Miscellaneous (Signed)
Summary: [employer]   [employer]   Imported By: Margie Billet 04/18/2010 10:05:17  _____________________________________________________________________  External Attachment:    Type:   Image     Comment:   External Document

## 2011-01-29 NOTE — Assessment & Plan Note (Signed)
Summary: L leg pain, weakness/pcp-pokharel/hla   Vital Signs:  Patient profile:   50 year old female Height:      64.5 inches (163.83 cm) Weight:      203.7 pounds (89.95 kg) BMI:     34.55 Temp:     97.9 degrees F (36.61 degrees C) oral Pulse rate:   82 / minute BP sitting:   129 / 76  (left arm) Cuff size:   large  Vitals Entered By: Theotis Barrio NT II (January 12, 2010 1:45 PM) CC: BLURRED VISION -  HAS GOTTEN WORSE / LEFT LEG PAIN   /  MEDICATION REFILL ON ABOUT ALL MEDS! Is Patient Diabetic? No Pain Assessment Patient in pain? yes     Location: LEFT LEG Intensity:        10 Type: HEAVY Onset of pain  STARTED WED.  /HAD TO GO TO COURT Nutritional Status BMI of > 30 = obese  Have you ever been in a relationship where you felt threatened, hurt or afraid?No   Does patient need assistance? Functional Status Self care Ambulation Normal Comments WALKS WITH THE ASSIST OF A CANE   / MEDICATION  REFIL ON ALL MEDCS.   Primary Care Provider:  Valetta Close MD  CC:  BLURRED VISION -  HAS GOTTEN WORSE / LEFT LEG PAIN   /  MEDICATION REFILL ON ABOUT ALL MEDS!Marland Kitchen  History of Present Illness: 50 yo woman who presents for acute visit for leg pain and anxiety:  Peripheral vasc disease: Is s/p fem-pop LLE 2009 that immediately occluded.  This patient is followed by Dr. Darrick Penna of vascular surgery and sees him every 6 months. She was most recently seen 08-2009 at which time she had essentially normal ABIs and he planned no further surgical intervention and said that she needs to work on pain control with her PCP. She says that she is taking the vicodin but that it is not helping. She says the pain is sharp and intense and that she also has cramping in the lef and into the left foot.   Anxiety: has appointment at [facility] and that they are telling her that she will need to get her PCP to Rx her anti-anxiety meds until she has her appointment.  Seeing psychiatrist 01-23-2010  but that they are trying to get her in sooner. Says that she has all of her meds but the vistaril.  Says she she was hospitalized for anxiety, depression, "nervous breakdown" as she puts it in mid-december 2010. She was hearing voices and felt like things were crawling on her. She says that she actually thought about cutting her leg off. She says that she was committed at that time. She says that she is on Seroquel at that time and that they have increased the dose.  She says that she is still having significant anxiety.  She says that she has dreams that her leg is off and is in the freezer.  She says that this happens most when her leg is hurting.  She feels that as long as she takes her medicine she is not going to hurt anyone else. She says that she is not going to hurt herself. She says that she just wants the pain to go away.   Breast mass: She needs referral for repeat mammo[facility]   Has thyroid uptake scan 17-18th of this month.   Tobacco Abuse: Says that she does still smoke when she can get them from  other people. 1 pack lasts several weeks.   Preventive Screening-Counseling & Management  Alcohol-Tobacco     Smoking Status: quit     Smoking Cessation Counseling: yes     Packs/Day: 1.0     Year Started: 1/4 pack month  Caffeine-Diet-Exercise     Does Patient Exercise: yes     Type of exercise: walking     Times/week: 5  Problems Prior to Update: 1)  Unspecified Fall  (ICD-E888.9) 2)  Ankle Pain, Left  (ICD-719.47) 3)  Thyroid Stimulating Hormone, Abnormal  (ICD-246.9) 4)  Depression  (ICD-311) 5)  Inadequate Material Resources  (ICD-V60.2) 6)  Knee Pain, Bilateral  (ICD-719.46) 7)  Unspecified Disorder Teeth&supporting Structures  (ICD-525.9) 8)  Screening Examination For Venereal Disease  (ICD-V74.5) 9)  Breast Lump  (ICD-611.72) 10)  Irregular Menses  (ICD-626.4) 11)  Leukocytosis Nos  (ICD-288.60) 12)  Anxiety State Nos  (ICD-300.00) 13)   Hyperlipidemia, Mixed  (ICD-272.2) 14)  Tobacco Abuse  (ICD-305.1) 15)  Peripheral Vascular Disease  (ICD-443.9) 16)  Hypertension  (ICD-401.9)  Allergies (verified): 1)  ! * Darvocet 2)  ! * Strawberries 3)  ! * Cats 4)  ! * Gin  Review of Systems  The patient denies anorexia, fever, weight loss, weight gain, decreased hearing, hoarseness, chest pain, syncope, dyspnea on exertion, prolonged cough, headaches, hemoptysis, abdominal pain, melena, hematochezia, and severe indigestion/heartburn.         Has significant pain in the inner aspect of the LLE that is sharp in nature. She also has occassional cramps in the LLE and into the left foot. See HPI regarding anxiety.  Also has some loss of visual accuity when reading up close.   Physical Exam  General:  alert, well-developed, well-nourished, and well-hydrated.   Head:  normocephalic and atraumatic.   Eyes:  vision grossly intact, pupils equal, pupils round, and pupils reactive to light.   Ears:  no external deformities.   Nose:  no external deformity.   Lungs:  normal respiratory effort, no accessory muscle use, normal breath sounds, no crackles, and no wheezes.   Heart:  normal rate, regular rhythm, no murmur, no gallop, and no rub.   Abdomen:  soft, non-tender, and normal bowel sounds.   Msk:  Patient has large scar on LLE s/p fem-pop. No erythema, warthm, skin lesions.  Pulses:  left DP pulse intact. Neurologic:  alert & oriented X3 and cranial nerves II-XII intact.   Psych:  Patient is very anxious, She is oriented and alert, and she can answer questions appropriately. However, she becomes tearful when describing her pain and anxiety.    Impression & Recommendations:  Problem # 1:  PERIPHERAL VASCULAR DISEASE (ICD-443.9) Case discussed with Dr. Josem Kaufmann. This patient is followed by Dr. Darrick Penna of vascular surgery and sees him every 6 months. She was most recently seen 08-2009 at which time she had essentially normal ABIs and he  planned no further surgical intervention and said that she needs to work on pain control with her PCP. With essentially normal ABIs rest claudication is less likely and her pain seen more neuropathic in nature. As such, I have increased the dose of her neurontin. One could argue that since she says the opiates are not helping her that these should be removed, but in her high anxiety/pain state in the office today, I do not think this is the time for making that change. I think that her pain and anxiety are working together to make her very uncomfortable  at this point, and I think a fair amount of both of these problems center on her left leg as she describes having dreams that her leg is gone, etc. As mentioned below, I have given the requested Rx for hydroxyzine and also incresaed the dose of celexa as I think working on her psych issues will likely help her anxiety centered around her peripheral vascular disease and the associated pain.   Problem # 2:  ANXIETY STATE NOS (ICD-300.00) Case discussed with Dr. Josem Kaufmann. This patient is clearly suffering from significant anxiety. She was recently admitted as mentioned in HPI. She has run out of Vistaril which was hepling with her anxiety and feels that if I can Rx enough for her to make it to get upcoming psych appointment she will be in much better shape. I will Rx hydroxyzine [Vistaril] and also increase dose of celexa.  The following medications were removed from the medication list:    Celexa 20 Mg Tabs (Citalopram hydrobromide) .Marland Kitchen... Take 1 tablet by mouth once a day Her updated medication list for this problem includes:    Klonopin 1 Mg Tabs (Clonazepam) .Marland Kitchen... Take 1 pill by mouth three times a day.    Celexa 40 Mg Tabs (Citalopram hydrobromide) .Marland Kitchen... Take 1 tablet by mouth once a day    Hydroxyzine Pamoate 25 Mg Caps (Hydroxyzine pamoate) .Marland Kitchen... Take one tab by mouth every 6 hours as needed for anxiety  Problem # 3:  DEPRESSION (ICD-311) Have increased  Celexa as mentioned above. Patient is being seen by psychiatrist in  ~10 days.   The following medications were removed from the medication list:    Celexa 20 Mg Tabs (Citalopram hydrobromide) .Marland Kitchen... Take 1 tablet by mouth once a day Her updated medication list for this problem includes:    Klonopin 1 Mg Tabs (Clonazepam) .Marland Kitchen... Take 1 pill by mouth three times a day.    Celexa 40 Mg Tabs (Citalopram hydrobromide) .Marland Kitchen... Take 1 tablet by mouth once a day    Hydroxyzine Pamoate 25 Mg Caps (Hydroxyzine pamoate) .Marland Kitchen... Take one tab by mouth every 6 hours as needed for anxiety  Problem # 4:  TOBACCO ABUSE (ICD-305.1) The patient said that she can no longer afford to smoke and only gets them when other people give them to her. She tells me that she was using the patch with success but saw a commercial saying that the patch causes cancer and she was scared to use it. I have emphasized the health consequences of smoking and have explained that I think the patch is safe and even if the patch did have some health consequences it is definitely safer than continuing to smoke. She says that she is smoking so little at this point that she thinks she will be able to quit and I encourage her to continue to cut down until she can quit completely.   Problem # 5:  BREAST LUMP (ICD-611.72) The patient had a breast lump 02/2008 that was Bx in 05-2008 showing no edivence of malignancy. She is due for repeat mammo and I am referring her today.   Problem # 6:  VISUAL ACUITY, DECREASED (ICD-369.9) The patient describes having less visual acuity when reading up close. No visual field deficits or acute changes. She has OTC reading glasses but these are not helping adequately. I have informed her that she can go to an eye doctor without a referral to be evaluated for glasses but that I think she may just have normal loss  of close vision that comes with age. I have recommended that she go to  a drug store and try on different  strengths of OTC reading glasses and that if this works there may be no need for anything further. I have encouraged her to try this and return to clinic if this does not work.   Problem # 7:  THYROID STIMULATING HORMONE, ABNORMAL (ICD-246.9) Has thyroid uptake scan 17-18th of this month. Our clinic will follow these results.   Complete Medication List: 1)  Hydrochlorothiazide 25 Mg Tabs (Hydrochlorothiazide) .... Take 1 tablet by mouth once a day 2)  Lisinopril 40 Mg Tabs (Lisinopril) .... Take 1 tablet by mouth once a day 3)  Aspirin 81 Mg Tabs (Aspirin) .... Take 1 tablet by mouth once a day 4)  Hydrocodone-acetaminophen 10-325 Mg Tabs (Hydrocodone-acetaminophen) .... Take one tab by mouth once every 6 hours as needed for pain 5)  Coreg 25 Mg Tabs (Carvedilol) .... Take 1 pill by mouth two times a day. 6)  Lipitor 40 Mg Tabs (Atorvastatin calcium) .... Take 1 pill by mouth daily. 7)  Proair Hfa 108 (90 Base) Mcg/act Aers (Albuterol sulfate) .... Take 1-2 puffs as needed every 4-6 hours as needed for shortness of breath 8)  Klonopin 1 Mg Tabs (Clonazepam) .... Take 1 pill by mouth three times a day. 9)  Niacin Cr 1000 Mg Cr-tabs (Niacin) .... Take 1 pill by mouth daily. take aspirin 81 mg 30 minutes before taking niacin. 10)  Neurontin 600 Mg Tabs (Gabapentin) .... Take 1 pill by mouth twice daily. 11)  Norvasc 5 Mg Tabs (Amlodipine besylate) .... Take 1 pill by mouth daily. 12)  Seroquel 100 Mg Tabs (Quetiapine fumarate) .... Take 1 pill at bedtime, per mental health. 13)  Promethazine Hcl 25 Mg Tabs (Promethazine hcl) .... Take 1 pill by mouth three times a day as needed for vomiting. 14)  Robitussin Dm 100-10 Mg/14ml Syrp (Dextromethorphan-guaifenesin) .... Take 10 cc by mouth three times a day as needed for cough. 15)  Celexa 40 Mg Tabs (Citalopram hydrobromide) .... Take 1 tablet by mouth once a day 16)  Hydroxyzine Pamoate 25 Mg Caps (Hydroxyzine pamoate) .... Take one tab by mouth every  6 hours as needed for anxiety  Other Orders: Mammogram (Screening) (Mammo) Tdap => 110yrs IM (09811) Admin 1st Vaccine (91478) Admin 1st Vaccine [facility]) [phone])  Patient Instructions: 1)  Please make a followup appointment in 1 month to monitor your symptoms. 2)  Please keep your upcoming appointment with your psychiatrist. Prescriptions: HYDROXYZINE PAMOATE 25 MG CAPS (HYDROXYZINE PAMOATE) Take one tab by mouth every 6 hours as needed for anxiety  #30 x 0   Entered and Authorized by:   Aris Lot MD   Signed by:   Aris Lot MD on 01/12/2010   Method used:   Print then Give to Patient   RxID:   [id number] CELEXA 40 MG TABS (CITALOPRAM HYDROBROMIDE) Take 1 tablet by mouth once a day  #31 x 1   Entered and Authorized by:   Aris Lot MD   Signed by:   Aris Lot MD on 01/12/2010   Method used:   Print then Give to Patient   RxID:   [id number] NEURONTIN 600 MG TABS (GABAPENTIN) take 1 pill by mouth twice daily.  #62 x 1   Entered and Authorized by:   Aris Lot MD   Signed by:   Aris Lot MD on 01/12/2010   Method used:   Print then  Give to Patient   RxID:   [id number]    Prevention & Chronic Care Immunizations   Influenza vaccine: Fluvax 3+  (09/25/2009)    Tetanus booster: 01/12/2010: Tdap    Pneumococcal vaccine: Not documented  Other Screening   Pap smear: Not documented   Pap smear action/deferral: Deferred  (08/21/2009)    Mammogram: Assessment: BIRADS 4. Suspicious.  IMPRESSION: 1.  Lobulated heterogeneous solid and cystic mass 10 o'clock right  breast measuring approximately  4.6 cm in greatest dimension is indeterminate.  Malignancy is not  excluded.  Ultrasound-guided core needle biopsy is recommended. 2.  Prominent lymph node in axillary tail of the right breast.   Particularly in light of the  findings in the 10 o'clock region of the right breast,  ultrasound-guided core needle biopsy of this lymph  node is suggested at the time of biopsy. 3.  No evidence of malignancy in the left breast.   Recommend ultrasound-guided core needle biopsy of palpable right  breast mass and prominent lymph  node in the axillary tail of the right breast.  (03/10/2008)   Mammogram action/deferral: Ordered  (01/12/2010)   Smoking status: quit  (01/12/2010)  Lipids   Total Cholesterol: 137  (12/01/2009)   Lipid panel action/deferral: Lipid Panel ordered   LDL: 83  (12/01/2009)   LDL Direct: Not documented   HDL: 33  (12/01/2009)   Triglycerides: 104  (12/01/2009)    SGOT (AST): 13  (12/01/2009)   BMP action: Ordered   SGPT (ALT): 14  (12/01/2009)   Alkaline phosphatase: 56  (12/01/2009)   Total bilirubin: 0.2  (12/01/2009)  Hypertension   Last Blood Pressure: 129 / 76  (01/12/2010)   Serum creatinine: 0.71  (12/01/2009)   BMP action: Ordered   Serum potassium 4.7  (12/01/2009)  Self-Management Support :   Personal Goals (by the next clinic visit) :      Personal blood pressure goal: 130/80  (09/25/2009)     Personal LDL goal: 70  (09/25/2009)    Patient will work on the following items until the next clinic visit to reach self-care goals:     Medications and monitoring: take my medicines every day, bring all of my medications to every visit  (01/12/2010)     Eating: drink diet soda or water instead of juice or soda, use fresh or frozen vegetables, eat foods that are low in salt, eat baked foods instead of fried foods  (01/12/2010)     Activity: take a 30 minute walk every day  (12/27/2009)    Hypertension self-management support: Written self-care plan  (12/01/2009)    Lipid self-management support: Written self-care plan  (12/01/2009)    Nursing Instructions: Schedule screening mammogram (see order) Give tetanus booster today: TDaP.     Tetanus/Td Vaccine    Vaccine Type: Tdap    Site: left deltoid    Mfr: [employer]    Dose: 0.5 ml    Route: IM    Given by: Jennet Maduro RN    Exp. Date: 07/15/2011    Lot #: [id number] Jennet Maduro RN  January 12, 2010 3:24 PM

## 2011-01-29 NOTE — Assessment & Plan Note (Signed)
Summary: 1 month f/u from 2/17 pt. unable to come [mkj   Vital Signs:  Patient profile:   50 year old female Height:      64.5 inches (163.83 cm) Weight:      207.8 pounds (94.45 kg) BMI:     35.24 Temp:     98.1 degrees F (36.72 degrees C) oral Pulse rate:   81 / minute BP sitting:   149 / 101  (right arm) Cuff size:   regular  Vitals Entered By: Alisha [name] NT II (February 22, 2010 1:57 PM) CC: PAIN MED IS NOT WORKING / LEFT LEG PAIN # 10 /  NEED RX SENT TO MAP PROGRAM  /  SWELLING IN LEGS  / REQUEST FOR PANIC ATTACK, Depression Is Patient Diabetic? Yes  Have you ever been in a relationship where you felt threatened, hurt or afraid?No   Does patient need assistance? 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  History of Present Illness: Alisha [name] is a 50 yo lady with PMH as outlined in the EMR comes today for a f/u visit.   1. HTN: She did not take her medications today. But she states she is taking all her medications what she is supposed to take it.   2. Anxiety/depression: She has thoughts of cutting her legs open from pain in the legs. She has this feeling all the time. She is crying today as always. She is seeing her psychiatrist tomorrow.   3. Tobacco abuse:She is not smoking now.   4. HL: She is taking her niacin and lipitor.   5. Abnormal thyroid function:  Her iodine uptake study was normal.   6. Cough: Pt states she had a fever of 101 last week and has dry cough with occasional suputum. She also has chills.   Depression History:      The patient is having a depressed mood  most of the day and has a diminished interest in her usual daily activities.        Suicide risk questions reveal that she has thought about ending her life and she has even planned how to end her life.  The patient denies that she feels like life is not worth living and denies that she wishes that she were dead.        Comments:  PATIENT STATES THAT SHE PRAYS TO GOD FOR RELIEF OF THE PAIN.   Preventive Screening-Counseling & Management  Alcohol-Tobacco     Smoking Status: quit     Smoking Cessation Counseling: yes     Packs/Day: 1.0     Year Started: 1/4 pack month  Caffeine-Diet-Exercise     Does Patient Exercise: yes     Type of exercise: walking     Times/week: 5  Current Medications (verified): 1)  Hydrochlorothiazide 25 Mg Tabs (Hydrochlorothiazide) .... Take 1 Tablet By Mouth Once A Day 2)  Lisinopril 40 Mg  Tabs (Lisinopril) .... Take 1 Tablet By Mouth Once A Day 3)  Aspirin 81 Mg Tabs (Aspirin) .Marland KitchenMarland KitchenMarland Kitchen  Take 1 Tablet By Mouth Once A Day 4)  Hydrocodone-Acetaminophen 10-325 Mg Tabs (Hydrocodone-Acetaminophen) .... Take One Tab By Mouth Once Every 6 Hours As Needed For Pain 5)  Coreg 25 Mg Tabs (Carvedilol) .... Take 1 Pill By Mouth Two Times A Day. 6)  Lipitor 40 Mg Tabs (Atorvastatin Calcium) .... Take 1 Pill By Mouth Daily. 7)  Proair Hfa 108 (90 Base) Mcg/act Aers (Albuterol Sulfate) .... Take 1-2 Puffs As Needed Every 4-6 Hours As Needed For Shortness of Breath 8)  Klonopin 1 Mg Tabs (Clonazepam) .... Take 1 Pill By Mouth Three Times A Day. 9)  Niacin Cr 1000 Mg Cr-Tabs (Niacin) .... Take 1 Pill By Mouth Daily. Take Aspirin 81 Mg 30 Minutes Before Taking Niacin. 10)  Neurontin 600 Mg Tabs (Gabapentin) .... Take 1 Pill By Mouth Three Times A Day. 11)  Norvasc 5 Mg Tabs (Amlodipine Besylate) .... Take 1 Pill By Mouth Daily. 12)  Seroquel 100 Mg Tabs (Quetiapine Fumarate) .... Take 1 Pill At Bedtime, Per Mental Health. 13)  Promethazine Hcl 25 Mg Tabs (Promethazine Hcl) .... Take  1 Pill By Mouth Three Times A Day As Needed For Vomiting. 14)  Robitussin Dm 100-10 Mg/23ml Syrp (Dextromethorphan-Guaifenesin) .... Take 10 Cc By Mouth Three Times A Day As Needed For Cough. 15)  Celexa 40 Mg Tabs (Citalopram Hydrobromide) .... Take 1 Tablet By Mouth Once A Day 16)  Hydroxyzine Pamoate 25 Mg Caps (Hydroxyzine Pamoate) .... Take One Tab By Mouth Every 6 Hours As Needed For Anxiety 17)  Alprazolam 0.5 Mg Tabs (Alprazolam) .... Take 1 Pill By Mouth Three Times A Day As Needed For Anxiety.  Allergies: 1)  ! * Darvocet 2)  ! * Strawberries 3)  ! * Cats 4)  ! * Gin  Review of Systems      See HPI  Physical Exam  General:  alert.   Mouth:  pharynx pink and moist.   Lungs:  normal breath sounds, no crackles, and no wheezes.   Heart:  normal rate, regular rhythm, and no murmur.   Abdomen:  soft and non-tender.   Extremities:  2+ left pedal edema and trace right pedal edema.   Neurologic:  alert & oriented X3.   Cervical Nodes:  no anterior cervical adenopathy and no posterior cervical adenopathy.     Impression & Recommendations:  Problem # 1:  COUGH (ICD-786.2) Pt has cough for a month with occasional sputum. She also endorses subjective fever and chills. On exam she has some bronchial breath sounds. Plan is to get CXR to guide treatment regarding the need of antibiotics.  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 She has been reevaluated by her vascular surgeon and told she has no further surgical option at this time and her ABI indeed is good. She has no arthritis of knee or ankle. It is possible that she has reflex sympathetic dystrophy after the surgery. I will increase her neurontin from 600 two times a day to three times a day and if her pain persists in the long run, it is worth referring her to the pain clinic to see if RDS is an  accurate diagnosis and something can be done for it. Today I felt like her left leg was little more swollen and I ordered to get a doppler of her leg to r/o DVT.  Her updated medication  list for this problem includes:    Hydrochlorothiazide 25 Mg Tabs (Hydrochlorothiazide) .Marland Kitchen... Take 1 tablet by mouth once a day  Orders: LE Venous Duplex (DVT) (DVT)  Problem # 3:  DVT (ICD-453.40) Pt interestingly has DVT of right leg. We will admit for further evaluation and treatment. Discussed with Dr. Josem Kaufmann.   Problem # 4:  TOBACCO ABUSE (ICD-305.1) Discussed to quit smoking again, especially in light of her arterial disease and concern for RSD.   Problem # 5:  HYPERTENSION (ICD-401.9) Pt didn't bring her any medicaitons and her BP was elevated. She is getting admitted and further care is continued as inpatient.  Her updated medication list for this problem includes:    Hydrochlorothiazide 25 Mg Tabs (Hydrochlorothiazide) .Marland Kitchen... Take 1 tablet by mouth once a day    Lisinopril 40 Mg Tabs (Lisinopril) .Marland Kitchen... Take 1 tablet by mouth once a day    Coreg 25 Mg Tabs (Carvedilol) .Marland Kitchen... Take 1 pill by mouth two times a day.    Norvasc 5 Mg Tabs (Amlodipine besylate) .Marland Kitchen... Take 1 pill by mouth daily.  Orders: T-Comprehensive Metabolic Panel ([id number])  BP today: 149/101 Prior BP: 129/76 (01/12/2010)  Labs Reviewed: K+: 4.7 (12/01/2009) Creat: : 0.71 (12/01/2009)   Chol: 137 (12/01/2009)   HDL: 33 (12/01/2009)   LDL: 83 (12/01/2009)   TG: 104 (12/01/2009)  Complete Medication List: 1)  Hydrochlorothiazide 25 Mg Tabs (Hydrochlorothiazide) .... Take 1 tablet by mouth once a day 2)  Lisinopril 40 Mg Tabs (Lisinopril) .... Take 1 tablet by mouth once a day 3)  Aspirin 81 Mg Tabs (Aspirin) .... Take 1 tablet by mouth once a day 4)  Hydrocodone-acetaminophen 10-325 Mg Tabs (Hydrocodone-acetaminophen) .... Take one tab by mouth once every 6 hours as needed for pain 5)  Coreg 25 Mg Tabs (Carvedilol) .... Take 1  pill by mouth two times a day. 6)  Lipitor 40 Mg Tabs (Atorvastatin calcium) .... Take 1 pill by mouth daily. 7)  Proair Hfa 108 (90 Base) Mcg/act Aers (Albuterol sulfate) .... Take 1-2 puffs as needed every 4-6 hours as needed for shortness of breath 8)  Klonopin 1 Mg Tabs (Clonazepam) .... Take 1 pill by mouth three times a day. 9)  Niacin Cr 1000 Mg Cr-tabs (Niacin) .... Take 1 pill by mouth daily. take aspirin 81 mg 30 minutes before taking niacin. 10)  Neurontin 600 Mg Tabs (Gabapentin) .... Take 1 pill by mouth three times a day. 11)  Norvasc 5 Mg Tabs (Amlodipine besylate) .... Take 1 pill by mouth daily. 12)  Seroquel 100 Mg Tabs (Quetiapine fumarate) .... Take 1 pill at bedtime, per mental health. 13)  Promethazine Hcl 25 Mg Tabs (Promethazine hcl) .... Take 1 pill by mouth three times a day as needed for vomiting. 14)  Robitussin Dm 100-10 Mg/23ml Syrp (Dextromethorphan-guaifenesin) .... Take 10 cc by mouth three times a day as needed for cough. 15)  Celexa 40 Mg Tabs (Citalopram hydrobromide) .... Take 1 tablet by mouth once a day 16)  Hydroxyzine Pamoate 25 Mg Caps (Hydroxyzine pamoate) .... Take one tab by mouth every 6 hours as needed for anxiety 17)  Alprazolam 0.5 Mg Tabs (Alprazolam) .... Take 1 pill by mouth three times a day as needed for anxiety.  Other Orders: T-CBC w/Diff [phone]) T-T4, Free [phone]) T-TSH [phone])  Patient Instructions: 1)  Please schedule a follow-up appointment in 4 weeks. 2)  Limit your Sodium (Salt) to less than 2 grams a day(slightly less than 1/2 a teaspoon)  to prevent fluid retention, swelling, or worsening of symptoms. 3)  Tobacco is very bad for your health and your loved ones! You Should stop smoking!. 4)  Stop Smoking Tips: Choose a Quit date. Cut down before the Quit date. decide what you will do as a substitute when you feel the urge to smoke(gum,toothpick,exercise). 5)  It is important that you exercise regularly at least 20  minutes 5 times a week. If you develop chest pain, have severe difficulty breathing, or feel very tired , stop exercising immediately and seek medical attention. 6)  You need to lose weight. Consider a lower calorie diet and regular exercise.  Prescriptions: ALPRAZOLAM 0.5 MG TABS (ALPRAZOLAM) take 1 pill by mouth three times a day as needed for anxiety.  #60 x 2   Entered and Authorized by:   Jason Coop MD   Signed by:   Jason Coop MD on 02/22/2010   Method used:   Print then Give to Patient   RxID:   [id number] ROBITUSSIN DM 100-10 MG/5ML SYRP (DEXTROMETHORPHAN-GUAIFENESIN) take 10 cc by mouth three times a day as needed for cough.  #1 x 1   Entered and Authorized by:   Jason Coop MD   Signed by:   Jason Coop MD on 02/22/2010   Method used:   Print then Give to Patient   RxID:   [id number]  Process Orders Check Orders Results:     [facility]: ABN not required for this insurance Tests Sent for requisitioning (February 24, 2010 11:19 PM):     02/22/2010: [facility] -- T-Comprehensive Metabolic Panel [[id number]] (signed)     02/22/2010: [facility] -- T-CBC w/Diff [[id number]] (signed)     02/22/2010: [facility] -- [place], [place] [[id number]] (signed)     02/22/2010: [facility] -- T-TSH [phone] (signed)    Process Orders Check Orders Results:     [facility]: ABN not required for this insurance Tests Sent for requisitioning (February 24, 2010 11:19 PM):     02/22/2010: [facility] -- T-Comprehensive Metabolic Panel [[id number]] (signed)     02/22/2010: [facility] -- T-CBC w/Diff [[id number]] (signed)     02/22/2010: [facility] -- [place], [place] [[id number]] (signed)     02/22/2010: [facility] -- T-TSH [phone] (signed)

## 2011-01-29 NOTE — Assessment & Plan Note (Signed)
Summary: COU/VS  Anticoagulant Therapy Managed by: Barbera Setters. Janie Morning  PharmD CACP PCP: Valetta Close MD [facility] Attending: Sampson Goon MD, Onalee Hua Indication 1: Deep vein thrombus Indication 2: Encounter for therapeutic drug monitoring  V58.83 Start date: 02/22/2010 Duration: 6 months  Patient Assessment Reviewed by: Chancy Milroy PharmD  March 05, 2010 Medication review: verified warfarin dosage & schedule,verified previous prescription medications, verified doses & any changes, verified new medications, reviewed OTC medications, reviewed OTC health products-vitamins supplements etc Complications: none Dietary changes: none   Health status changes: none   Lifestyle changes: none   Recent/future hospitalizations: none   Recent/future procedures: none   Recent/future dental: none Patient Assessment Part 2:  Have you MISSED ANY DOSES or CHANGED TABLETS?  No missed Warfarin doses or changed tablets.  Have you had any BRUISING or BLEEDING ( nose or gum bleeds,blood in urine or stool)?  No reported bruising or bleeding in nose, gums, urine, stool.  Have you STARTED or STOPPED any MEDICATIONS, including OTC meds,herbals or supplements?  No other medications or herbal supplements were started or stopped.  Have you CHANGED your DIET, especially green vegetables,or ALCOHOL intake?  No changes in diet or alcohol intake.  Have you had any ILLNESSES or HOSPITALIZATIONS?  No reported illnesses or hospitalizations  Have you had any signs of CLOTTING?(chest discomfort,dizziness,shortness of breath,arms tingling,slurred speech,swelling or redness in leg)    No chest discomfort, dizziness, shortness of breath, tingling in arm, slurred speech, swelling, or redness in leg.     Treatment  INR reflects regimen in: 1.9  New  Tablet strength: : 5mg  Regimen Out:     Sunday: 0 Tablet     Monday: 1 Tablet     Tuesday: 1 Tablet     Wednesday: 1/2 Tablet     Thursday: 1 Tablet      Friday: 1  Tablet     Saturday: 1 Tablet Total Weekly: 27.5mg /week mg  Next INR Due: 03/12/2010 Adjusted by: Barbera Setters. Alexandria Lodge III PharmD CACP   Return to anticoagulation clinic:  03/12/2010 Time of next visit: 1115    Allergies: 1)  ! * Darvocet 2)  ! * Strawberries 3)  ! * Cats 4)  ! * Gin
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
## 2011-01-29 NOTE — Progress Notes (Signed)
Summary: refill/gg  Phone Note Refill Request  on April 16, 2010 3:14 PM  Refills Requested: Medication #1:  PERCOCET 5-325 MG TABS take 1-2 tabs by mouth every 6 hourly as needed for pain.   Last Refilled: 03/15/2010  Medication #2:  LISINOPRIL 40 MG  TABS Take 1 tablet by mouth once a day  Medication #3:  HYDROCHLOROTHIAZIDE 25 MG TABS Take 1 tablet by mouth once a day Pt # [id number]   Method Requested: Pick up at Office Initial call taken by: Merrie Roof RN,  April 16, 2010 3:15 PM  Follow-up for Phone Call       Follow-up by: Jason Coop MD,  April 18, 2010 1:41 PM    Prescriptions: PERCOCET 5-325 MG TABS (OXYCODONE-ACETAMINOPHEN) take 1-2 tabs by mouth every 6 hourly as needed for pain  #120 x 0   Entered and Authorized by:   Jason Coop MD   Signed by:   Jason Coop MD on 04/18/2010   Method used:   Print then Give to Patient   RxID:   [id number] LISINOPRIL 40 MG  TABS (LISINOPRIL) Take 1 tablet by mouth once a day  #30 x 2   Entered and Authorized by:   Jason Coop MD   Signed by:   Jason Coop MD on 04/18/2010   Method used:   Faxed to ...       [facility] (retail)       [address] [place], [place]  [address]       Ph: [phone]       Fax: [phone]   RxID:   [id number] HYDROCHLOROTHIAZIDE 25 MG TABS (HYDROCHLOROTHIAZIDE) Take 1 tablet by mouth once a day  #30 x 2   Entered and Authorized by:   Jason Coop MD   Signed by:   Jason Coop MD on 04/18/2010   Method used:   Faxed to ...       [facility] (retail)       [address] [place], [place]  [address]       Ph: [phone]       Fax: [phone]   RxID:   [id number]   Appended Document: refill/gg Pt informed Rx is ready

## 2011-01-29 NOTE — Progress Notes (Signed)
Summary: refill/gg  Phone Note Refill Request  on March 27, 2010 2:00 PM  Refills Requested: Medication #1:  KLONOPIN 1 MG TABS take 1 pill by mouth three times a day. Pt states she lost her Rx for klonopin and wants another rx called in last refill 02/08/10   Method Requested: Telephone to Pharmacy Initial call taken by: Merrie Roof RN,  March 27, 2010 2:01 PM  Follow-up for Phone Call        I will route this question to Dr. Aleene Davidson, as he is her PCP and knows her better than I do. I would want to know if she has a history of losing her prescription (not that I could see in her chart, but I thought I would check with him).    Additional Follow-up for Phone Call Additional follow up Details #2::    I am not aware of Ms. Dieu having problems with losing medications before. Since she has pronounced anxiety, I will give a refill, but advice her not to loose any medication. I will also advice her that these are controlled substance and have a potential of abuse and if she looses them again in the future, she will not get refill earlier than indicated.  Follow-up by: Jason Coop MD,  March 28, 2010 8:24 AM  Prescriptions: KLONOPIN 1 MG TABS (CLONAZEPAM) take 1 pill by mouth three times a day.  #90 x 0   Entered by:   Jason Coop MD   Authorized by:   Silvestre Gunner MD   Signed by:   Jason Coop MD on 03/28/2010   Method used:   Telephoned to ...       [facility] (retail)       [address] [place], [place]  [address]       Ph: [phone]       Fax: [phone]   RxID:   [id number]   Appended Document: refill/gg Rx called in and pt informed

## 2011-01-29 NOTE — Letter (Signed)
Summary: [employer] C[employer]  [employer] C[employer]   Imported By: Margie Billet 06/05/2010 11:59:03  _____________________________________________________________________  External Attachment:    Type:   Image     Comment:   External Document

## 2011-01-29 NOTE — Progress Notes (Signed)
Summary: med change/gp  Phone Note Refill Request Message from:  Fax from Pharmacy on October 23, 2010 12:24 PM  Refills Requested: Medication #1:  LISINOPRIL 40 MG  TABS Take 1 tablet by mouth once a day [facility] can provide Accupril 40mg  once daily at no charge; if you consider to change pt. med.  Thanks   Method Requested: Fax to Local Pharmacy Initial call taken by: Chinita Pester RN,  October 23, 2010 12:24 PM    New/Updated Medications: ACCUPRIL 40 MG TABS (QUINAPRIL HCL) one a day Prescriptions: ACCUPRIL 40 MG TABS (QUINAPRIL HCL) one a day  #30 x 11   Entered and Authorized by:   Zoila Shutter MD   Signed by:   Zoila Shutter MD on 10/24/2010   Method used:   Faxed to ...       [facility] (retail)       [address]       [place], [place]  [address]       Ph: [phone]       Fax: [phone]   RxID:   [id number]

## 2011-01-29 NOTE — Assessment & Plan Note (Signed)
Summary: Social Work  Soc. Work.  45 minutes.  Referred by Dr. Coralee Pesa to ensure [facility] services.  Patient well known to social work with hx DVT, depression, chronic pain, bilateral knee pain.  Walks with cane. She is concerned about the patient who reports hx of  hearing voices and overall is not doing well from [facility] standpoint.  Lives with a man who is quite limited in being able to care for himself.   Met with patient and she is emotional about the idea of going to inpatient MH and possibility of putting her housing at risk.  She tells me she has a MH appmt on Sept 20th and that her Disability with [employer] should be resolved via hearing  within the next 60 days.   She is on Seroquel, Geodon, and Cymbalta prescribed thru MH and a fourth med that she wasn't sure of the name of.  She is unclear as to what her diagnoses are thru MH and mentions major depressive disorder, bipolar disorder, possibly schizophrenia.    She is amenable to signing a [facility] release to request the records.  She has the 24/7 [facility] line for immediate help and knows how to walk in to emergency services.  Two bus passes given so pt and "Ray" could get to their next appmt.  We had tried to get her [employer] transit but she apparently declined services because she couldn't afford the $1.30 each way to get to her appmts.   SW as needed.  Follow for [facility] records/Will fax request.
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
## 2011-01-29 NOTE — Progress Notes (Signed)
Summary: rtc/ hla  Phone Note Call from Patient   Summary of Call: pt called for results, rtc, got voicemail, left message fot pt to rtc Initial call taken by: Marin Roberts RN,  August 29, 2010 4:05 PM  Follow-up for Phone Call        pt called back, she was told that on her 9/13 appt dr Coralee Pesa would discuss her lab results with her, she is agreeable Follow-up by: Marin Roberts RN,  August 29, 2010 4:13 PM

## 2011-01-29 NOTE — Assessment & Plan Note (Signed)
Summary: COU/VS  Anticoagulant Therapy Managed by: Barbera Setters. Janie Morning  PharmD CACP PCP: Valetta Close MD [facility] Attending: Josem Kaufmann MD, [place] Indication 1: Deep vein thrombus Indication 2: Encounter for therapeutic drug monitoring  V58.83 Start date: 02/22/2010 Duration: 6 months  Patient Assessment Reviewed by: Chancy Milroy PharmD  April 02, 2010 Medication review: verified warfarin dosage & schedule,verified previous prescription medications, verified doses & any changes, verified new medications, reviewed OTC medications, reviewed OTC health products-vitamins supplements etc Complications: none Dietary changes: none   Health status changes: none   Lifestyle changes: none   Recent/future hospitalizations: none   Recent/future procedures: none   Recent/future dental: none Patient Assessment Part 2:  Have you MISSED ANY DOSES or CHANGED TABLETS?  No missed Warfarin doses or changed tablets.  Have you had any BRUISING or BLEEDING ( nose or gum bleeds,blood in urine or stool)?  No reported bruising or bleeding in nose, gums, urine, stool.  Have you STARTED or STOPPED any MEDICATIONS, including OTC meds,herbals or supplements?  No other medications or herbal supplements were started or stopped.  Have you CHANGED your DIET, especially green vegetables,or ALCOHOL intake?  No changes in diet or alcohol intake.  Have you had any ILLNESSES or HOSPITALIZATIONS?  No reported illnesses or hospitalizations  Have you had any signs of CLOTTING?(chest discomfort,dizziness,shortness of breath,arms tingling,slurred speech,swelling or redness in leg)    No chest discomfort, dizziness, shortness of breath, tingling in arm, slurred speech, swelling, or redness in leg.     Treatment  Target INR: 2.0-3.0 INR: 3.4  Date: 04/02/2010 Regimen In:  50mg /wk INR reflects regimen in: 3.4  New  Tablet strength: : 5mg  Regimen Out:     Sunday: 1 & 1/2 Tablet     Monday: 1 Tablet     Tuesday: 1 & 1/2  Tablet     Wednesday: 1 Tablet     Thursday: 1 & 1/2 Tablet      Friday: 1 Tablet     Saturday: 1 & 1/2 Tablet Total Weekly: 45.0mg /week mg  Next INR Due: 04/16/2010 Adjusted by: Barbera Setters. Alexandria Lodge III PharmD CACP   Return to anticoagulation clinic:  04/16/2010 Time of next visit: 1445    Allergies: 1)  ! * Darvocet 2)  ! * Strawberries 3)  ! * Cats 4)  ! * Gin

## 2011-01-29 NOTE — Progress Notes (Signed)
Summary: refill/ hla  Phone Note Refill Request Message from:  Patient on June 20, 2010 3:37 PM  Refills Requested: Medication #1:  HYDROCHLOROTHIAZIDE 25 MG TABS Take 1 tablet by mouth once a day   Dosage confirmed as above?Dosage Confirmed  Medication #2:  LISINOPRIL 40 MG  TABS Take 1 tablet by mouth once a day   Dosage confirmed as above?Dosage Confirmed  Medication #3:  COREG 25 MG TABS take 1 pill by mouth two times a day.   Dosage confirmed as above?Dosage Confirmed  Medication #4:  LIPITOR 40 MG TABS take 1 pill by mouth daily.   Dosage confirmed as above?Dosage Confirmed Initial call taken by: Marin Roberts RN,  June 20, 2010 3:37 PM    Prescriptions: LIPITOR 40 MG TABS (ATORVASTATIN CALCIUM) take 1 pill by mouth daily.  #30 x 6   Entered and Authorized by:   Zoila Shutter MD   Signed by:   Zoila Shutter MD on 06/20/2010   Method used:   Electronically to        [facility] (retail)       [address]       [place], [place]  [address]       Ph: [phone]       Fax: [phone]   RxID:   [id number] COREG 25 MG TABS (CARVEDILOL) take 1 pill by mouth two times a day.  #60 x 5   Entered and Authorized by:   Zoila Shutter MD   Signed by:   Zoila Shutter MD on 06/20/2010   Method used:   Electronically to        [facility] (retail)       [address]       [place], [place]  [address]       Ph: [phone]       Fax: [phone]   RxID:   [id number] LISINOPRIL 40 MG  TABS (LISINOPRIL) Take 1 tablet by mouth once a day  #30 x 6   Entered and Authorized by:   Zoila Shutter MD   Signed by:   Zoila Shutter MD on 06/20/2010   Method used:   Electronically to        [facility] (retail)       [address]       [place], [place]  [address]       Ph: [phone]       Fax: [phone]   RxID:   [id number] HYDROCHLOROTHIAZIDE 25 MG TABS  (HYDROCHLOROTHIAZIDE) Take 1 tablet by mouth once a day  #30 x 6   Entered and Authorized by:   Zoila Shutter MD   Signed by:   Zoila Shutter MD on 06/20/2010   Method used:   Electronically to        [facility] (retail)       [address]       [place], [place]  [address]       Ph: [phone]       Fax: [phone]   RxID:   [id number]

## 2011-01-29 NOTE — Medication Information (Signed)
Summary: PERCOCET  PERCOCET   Imported By: Margie Billet 04/24/2010 12:04:43  _____________________________________________________________________  External Attachment:    Type:   Image     Comment:   External Document

## 2011-01-29 NOTE — Progress Notes (Signed)
Summary: refill/ hla  Phone Note Refill Request Message from:  Patient on June 20, 2010 3:39 PM  Refills Requested: Medication #1:  PROAIR HFA 108 (90 BASE) MCG/ACT AERS Take 1-2 puffs as needed every 4-6 hours as needed for shortness of breath  Medication #2:  KLONOPIN 1 MG TABS take 1 pill by mouth three times a day.  Medication #3:  NIACIN CR 1000 MG CR-TABS take 1 pill by mouth daily. Take aspirin 81 mg 30 minutes before taking niacin.  Medication #4:  NEURONTIN 800 MG TABS take 1 pill by mouth three times a day. Initial call taken by: Marin Roberts RN,  June 20, 2010 3:40 PM    Prescriptions: NEURONTIN 800 MG TABS (GABAPENTIN) take 1 pill by mouth three times a day.  #90 x 5   Entered and Authorized by:   Zoila Shutter MD   Signed by:   Zoila Shutter MD on 06/20/2010   Method used:   Electronically to        [facility] (retail)       [address]       [place], [place]  [address]       Ph: [phone]       Fax: [phone]   RxID:   [id number] NIACIN CR 1000 MG CR-TABS (NIACIN) take 1 pill by mouth daily. Take aspirin 81 mg 30 minutes before taking niacin.  #30 x 5   Entered and Authorized by:   Zoila Shutter MD   Signed by:   Zoila Shutter MD on 06/20/2010   Method used:   Electronically to        [facility] (retail)       [address]       [place], [place]  [address]       Ph: [phone]       Fax: [phone]   RxID:   [id number] PROAIR HFA 108 (90 BASE) MCG/ACT AERS (ALBUTEROL SULFATE) Take 1-2 puffs as needed every 4-6 hours as needed for shortness of breath  #1 inhaler x 6   Entered and Authorized by:   Zoila Shutter MD   Signed by:   Zoila Shutter MD on 06/20/2010   Method used:   Electronically to        [facility] (retail)       [address]       [place], [place]  [address]       Ph: [phone]       Fax: [phone]   RxID:    [id number]

## 2011-01-29 NOTE — Miscellaneous (Signed)
Summary: [employer]  [employer]   Imported By: Shon Hough 07/27/2010 16:35:47  _____________________________________________________________________  External Attachment:    Type:   Image     Comment:   External Document

## 2011-01-29 NOTE — Assessment & Plan Note (Signed)
Summary: PAPWORK COMPL/EST/VS   Vital Signs:  Patient profile:   50 year old female Height:      64 inches Weight:      213.0 pounds BMI:     36.69 O2 Sat:      100 % on Room air Temp:     98.1 degrees F Pulse rate:   79 / minute BP sitting:   153 / 101  (right arm) Cuff size:   large  Vitals Entered By: Dorie Rank RN (April 02, 2010 1:52 PM)  O2 Flow:  Room air  Serial Vital Signs/Assessments:  Time      Position  BP       Pulse  Resp  Temp     By 3P        R Arm     152/101                        Dorie Rank RN 3P        L Arm     155/97                         Dorie Rank RN  CC: C/O Bilateral leg edema - worried about blood clots - c/o head cold - states has had fever at home (100.1-99) - feels like throat sore and hoarse and swollen - c/o "rattling in chest" and green-yellow nasal drainage - c/o legs burning and painful, Depression Is Patient Diabetic? No Pain Assessment Patient in pain? yes     Location: both legs and knees Intensity: 10 Type: burning Onset of pain  hands and toes cramping - since before bypass in 2009 Nutritional Status BMI of > 30 = obese  Have you ever been in a relationship where you felt threatened, hurt or afraid?No  Domestic Violence Intervention not right now - "DSS has been checking on me"  Does patient need assistance? Functional Status Self care Ambulation Normal Comments pt states alternate ph # to contact her - own phone - [phone], neighbor Darl [place] [phone]) Dorie Rank RN  April 02, 2010 3:27 PM    Primary Care Provider:  Valetta Close MD  CC:  C/O Bilateral leg edema - worried about blood clots - c/o head cold - states has had fever at home (100.1-99) - feels like throat sore and hoarse and swollen - c/o "rattling in chest" and green-yellow nasal drainage - c/o legs burning and painful and Depression.  History of Present Illness: Alisha [name] is a 50 yo lady with PMH as outlined in the EMR comes today for a f/u  visit.   1. Psychiatric problems:   2. Leg swelling/pain: Her legs swell and cramp when she walks long. She is concerned she may have a stroke as she already has clots in her whole leg.   3. HTN: She takes lisinopril, coreg and norvasc, but she didnot bring her medications.  4. HL: She takes lipitor and niacin.   5. DVT: She is taking her coumadin regularly.   6. Paperwork: Alisha [name] brought a paperwork for disability. She states she can walk for 15 minutes, stand for 15 minutes and start to hurt and swell her leg after that. She can sit for 5 hours, but can get asleep then. She cannot push/pull or lift/carry as her legs start to hurt and swell. She can stoop probably for 2 minutes only as her legs start to hurt  and swell.   7. Throat irritation: This is going on for a while. She denies chills. nasal discharge or SOB. but c/o 1 episode of temp of 101.  Depression History:      The patient is having a depressed mood most of the day and has a diminished interest in her usual daily activities.        Comments:  "run out of mental health medicine...still cannot get to sleep"  - "hear people caling me and I call the hot line" - has appt with psychiatrist April 15 - "don't eat unless they bring me food".   Preventive Screening-Counseling & Management  Alcohol-Tobacco     Smoking Status: current     Packs/Day: 0.25  Comments: smoking 3-5 cigarettes today  Current Medications (verified): 1)  Hydrochlorothiazide 25 Mg Tabs (Hydrochlorothiazide) .... Take 1 Tablet By Mouth Once A Day 2)  Lisinopril 40 Mg  Tabs (Lisinopril) .... Take 1 Tablet By Mouth Once A Day 3)  Aspirin 81 Mg Tabs (Aspirin) .... Take 1 Tablet By Mouth Once A Day 4)  Coreg 25 Mg Tabs (Carvedilol) .... Take 1 Pill By Mouth Two Times A Day. 5)  Lipitor 40 Mg Tabs (Atorvastatin Calcium) .... Take 1 Pill By Mouth Daily. 6)  Proair Hfa 108 (90 Base) Mcg/act Aers (Albuterol Sulfate) .... Take 1-2 Puffs As Needed Every 4-6 Hours  As Needed For Shortness of Breath 7)  Klonopin 1 Mg Tabs (Clonazepam) .... Take 1 Pill By Mouth Three Times A Day. 8)  Niacin Cr 1000 Mg Cr-Tabs (Niacin) .... Take 1 Pill By Mouth Daily. Take Aspirin 81 Mg 30 Minutes Before Taking Niacin. 9)  Neurontin 800 Mg Tabs (Gabapentin) .... Take 1 Pill By Mouth Three Times A Day. 10)  Norvasc 10 Mg Tabs (Amlodipine Besylate) .... Take 1 Pill By Mouth Daily. 11)  Seroquel 50 Mg Tabs (Quetiapine Fumarate) .... Take 3 Tablets At Bedtime 12)  Promethazine Hcl 25 Mg Tabs (Promethazine Hcl) .... Take 1 Pill By Mouth Three Times A Day As Needed For Vomiting. 13)  Robitussin Dm 100-10 Mg/72ml Syrp (Dextromethorphan-Guaifenesin) .... Take 10 Cc By Mouth Three Times A Day As Needed For Cough. 14)  Celexa 40 Mg Tabs (Citalopram Hydrobromide) .... Take 1/4 Tablet By Mouth Once A Day For 4 Days in Morning, Then Stop 15)  Hydroxyzine Pamoate 25 Mg Caps (Hydroxyzine Pamoate) .... Take One Tab By Mouth Every 6 Hours As Needed For Anxiety 16)  Alprazolam 0.5 Mg Tabs (Alprazolam) .... Take 1 Pill By Mouth Three Times A Day As Needed For Anxiety. 17)  Cymbalta 20 Mg Cpep (Duloxetine Hcl) .... Take 1 Tablet By Mouth Once A Day For 4 Days in Morning, Then Take 1 Tablet By Mouth Two Times A Day 18)  Lovenox 100 Mg/ml Soln (Enoxaparin Sodium) .Marland Kitchen.. 100 Mg Subcutaneous Injection Two Times A Day For 4 Days 19)  Coumadin 5 Mg Tabs (Warfarin Sodium) .... Take 1 Tablet By Mouth Once A Day 20)  Percocet 5-325 Mg Tabs (Oxycodone-Acetaminophen) .... Take 1-2 Tabs By Mouth Every 6 Hourly As Needed For Pain  Allergies: 1)  ! * Darvocet 2)  ! * Strawberries 3)  ! * Cats 4)  ! * Gin  Social History: Packs/Day:  0.25  Review of Systems      See HPI  Physical Exam  General:  alert.   Mouth:  pharynx pink and moist, no erythema, no exudates, and no posterior lymphoid hypertrophy.   Lungs:  normal breath  sounds, no crackles, and no wheezes.   Heart:  normal rate, regular rhythm, no  murmur, no gallop, and no rub.   Extremities:  trace left pedal edema and trace right pedal edema. As always she c/o calf tenderness. No redness or warmth.   Neurologic:  alert & oriented X3.     Impression & Recommendations:  Problem # 1:  SCREENING FOR MALIGNANT NEOPLASM OF THE CERVIX (ICD-V76.2)  Will refer to GYN.   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  Her updated medication list for this problem includes:    Klonopin 1 Mg Tabs (Clonazepam) .Marland Kitchen... Take 1 pill by mouth three times a day.    Celexa 40 Mg Tabs (Citalopram hydrobromide) .Marland Kitchen... Take 1/4 tablet by mouth once a day for 4 days in morning, then stop    Hydroxyzine Pamoate 25 Mg Caps (Hydroxyzine pamoate) .Marland Kitchen... Take one tab by mouth every 6 hours as needed for anxiety    Alprazolam 0.5 Mg Tabs (Alprazolam) .Marland Kitchen... Take 1 pill by mouth three times a day as needed for anxiety.    Cymbalta 20 Mg Cpep (Duloxetine hcl) .Marland Kitchen... Take 1 tablet by mouth once a day for 4 days in morning, then take 1 tablet by mouth two times a day  Problem # 3:  DVT (ICD-453.40) She is supposed to see Dr. Alexandria Lodge today.   Problem # 4:  HYPERLIPIDEMIA, MIXED (ICD-272.2) Pt states she is taking her following meds. Check CMET.  Her updated medication list for this problem includes:    Lipitor 40 Mg Tabs (Atorvastatin calcium) .Marland Kitchen... Take 1 pill by mouth daily.    Niacin Cr 1000 Mg Cr-tabs (Niacin) .Marland Kitchen... Take 1 pill by mouth daily. take aspirin 81 mg 30 minutes before taking niacin.  Orders: T-Comprehensive Metabolic Panel ([id number])  Problem # 5:  HYPERTENSION (ICD-401.9) Continue her following meds. I will increase the dose of norvasc from 5-10.  Her updated medication list for this problem includes:    Hydrochlorothiazide 25 Mg Tabs (Hydrochlorothiazide) .Marland Kitchen... Take 1 tablet by mouth once a day    Lisinopril 40 Mg Tabs  (Lisinopril) .Marland Kitchen... Take 1 tablet by mouth once a day    Coreg 25 Mg Tabs (Carvedilol) .Marland Kitchen... Take 1 pill by mouth two times a day.    Norvasc 10 Mg Tabs (Amlodipine besylate) .Marland Kitchen... Take 1 pill by mouth daily.  Orders: T-Comprehensive Metabolic Panel ([id number])  Problem # 6:  PERIPHERAL VASCULAR DISEASE (ICD-443.9)  Pt is s/p left femoral to below knee popliteal bypass surgery and s/p endartectomy in 11/09. She started to c/o severe pain in the left and sometimes in the right leg after the surgery. She doesnot have OA of her knees. She is already on high dose of neurontin and percocet. It is likely she can have reflex sympathetic dystrophy. Plan is to refer her to pain clinic for pain management.   Orders: Pain Clinic Referral (Pain)  Problem # 7:  TOBACCO ABUSE (ICD-305.1) Pt states she still smokes 3 cigg per day. I talked her in detail about the need to quit smoking.   Problem # 8:  COUGH (ICD-786.2) Pt says she has some throat irritation and one episode of 101 temp yesterday. She has some dry cough, but no dyspnea. Exam is benign, plan is to treat conservatively.   Complete Medication List: 1)  Hydrochlorothiazide 25 Mg Tabs (Hydrochlorothiazide) .... Take 1 tablet by mouth  once a day 2)  Lisinopril 40 Mg Tabs (Lisinopril) .... Take 1 tablet by mouth once a day 3)  Aspirin 81 Mg Tabs (Aspirin) .... Take 1 tablet by mouth once a day 4)  Coreg 25 Mg Tabs (Carvedilol) .... Take 1 pill by mouth two times a day. 5)  Lipitor 40 Mg Tabs (Atorvastatin calcium) .... Take 1 pill by mouth daily. 6)  Proair Hfa 108 (90 Base) Mcg/act Aers (Albuterol sulfate) .... Take 1-2 puffs as needed every 4-6 hours as needed for shortness of breath 7)  Klonopin 1 Mg Tabs (Clonazepam) .... Take 1 pill by mouth three times a day. 8)  Niacin Cr 1000 Mg Cr-tabs (Niacin) .... Take 1 pill by mouth daily. take aspirin 81 mg 30 minutes before taking niacin. 9)  Neurontin 800 Mg Tabs (Gabapentin) .... Take 1 pill by  mouth three times a day. 10)  Norvasc 10 Mg Tabs (Amlodipine besylate) .... Take 1 pill by mouth daily. 11)  Seroquel 50 Mg Tabs (Quetiapine fumarate) .... Take 3 tablets at bedtime 12)  Promethazine Hcl 25 Mg Tabs (Promethazine hcl) .... Take 1 pill by mouth three times a day as needed for vomiting. 13)  Robitussin Dm 100-10 Mg/51ml Syrp (Dextromethorphan-guaifenesin) .... Take 10 cc by mouth three times a day as needed for cough. 14)  Celexa 40 Mg Tabs (Citalopram hydrobromide) .... Take 1/4 tablet by mouth once a day for 4 days in morning, then stop 15)  Hydroxyzine Pamoate 25 Mg Caps (Hydroxyzine pamoate) .... Take one tab by mouth every 6 hours as needed for anxiety 16)  Alprazolam 0.5 Mg Tabs (Alprazolam) .... Take 1 pill by mouth three times a day as needed for anxiety. 17)  Cymbalta 20 Mg Cpep (Duloxetine hcl) .... Take 1 tablet by mouth once a day for 4 days in morning, then take 1 tablet by mouth two times a day 18)  Lovenox 100 Mg/ml Soln (Enoxaparin sodium) .Marland Kitchen.. 100 mg subcutaneous injection two times a day for 4 days 19)  Coumadin 5 Mg Tabs (Warfarin sodium) .... Take 1 tablet by mouth once a day 20)  Percocet 5-325 Mg Tabs (Oxycodone-acetaminophen) .... Take 1-2 tabs by mouth every 6 hourly as needed for pain  Patient Instructions: 1)  Please schedule a follow-up appointment in 2 months. 2)  Limit your Sodium (Salt) to less than 2 grams a day(slightly less than 1/2 a teaspoon) to prevent fluid retention, swelling, or worsening of symptoms. 3)  It is important that you exercise regularly at least 20 minutes 5 times a week. If you develop chest pain, have severe difficulty breathing, or feel very tired , stop exercising immediately and seek medical attention. 4)  You need to lose weight. Consider a lower calorie diet and regular exercise.  5)  Check your Blood Pressure regularly. If it is above: you should make an appointment. Prescriptions: NORVASC 10 MG TABS (AMLODIPINE BESYLATE)  take 1 pill by mouth daily.  #30 x 3   Entered and Authorized by:   Jason Coop MD   Signed by:   Jason Coop MD on 04/02/2010   Method used:   Faxed to ...       [facility] (retail)       [address] [place], [place]  [address]       Ph: [phone]       Fax: [phone]   RxID:   [id number] NEURONTIN 800 MG TABS (GABAPENTIN) take  1 pill by mouth three times a day.  #90 x 3   Entered and Authorized by:   Jason Coop MD   Signed by:   Jason Coop MD on 04/02/2010   Method used:   Faxed to ...       [facility] (retail)       [address] [place], [place]  [address]       Ph: [phone]       Fax: [phone]   RxID:   [id number]  Process Orders Check Orders Results:     [facility]: ABN not required for this insurance Tests Sent for requisitioning (April 02, 2010 5:37 PM):     04/02/2010: [facility] -- T-Comprehensive Metabolic Panel [phone] (signed)    Prevention & Chronic Care Immunizations   Influenza vaccine: Fluvax 3+  (09/25/2009)   Influenza vaccine deferral: Deferred  (04/02/2010)    Tetanus booster: 01/12/2010: Tdap    Pneumococcal vaccine: Not documented   Pneumococcal vaccine deferral: Deferred  (04/02/2010)  Other Screening   Pap smear: Not documented   Pap smear action/deferral: GYN Referral  (04/02/2010)    Mammogram: Assessment: BIRADS 4. Suspicious.  IMPRESSION: 1.  Lobulated heterogeneous solid and cystic mass 10 o'clock right  breast measuring approximately  4.6 cm in greatest dimension is indeterminate.  Malignancy is not  excluded.  Ultrasound-guided core needle biopsy is recommended. 2.  Prominent lymph node in axillary tail of the right breast.   Particularly in light of the  findings in the 10 o'clock region of the right breast,  ultrasound-guided core needle biopsy of this lymph node is suggested  at the time of biopsy. 3.  No evidence of malignancy in the left breast.   Recommend ultrasound-guided core needle biopsy of palpable right  breast mass and prominent lymph  node in the axillary tail of the right breast.  (03/10/2008)   Mammogram action/deferral: Ordered  (01/12/2010)   Smoking status: current  (04/02/2010)   Smoking cessation counseling: yes  (03/12/2010)  Lipids   Total Cholesterol: 137  (12/01/2009)   Lipid panel action/deferral: Lipid Panel ordered   LDL: 83  (12/01/2009)   LDL Direct: Not documented   HDL: 33  (12/01/2009)   Triglycerides: 104  (12/01/2009)    SGOT (AST): 13  (12/01/2009)   BMP action: Ordered   SGPT (ALT): 14  (12/01/2009) CMP ordered    Alkaline phosphatase: 56  (12/01/2009)   Total bilirubin: 0.2  (12/01/2009)    Lipid flowsheet reviewed?: Yes   Progress toward LDL goal: Unchanged  Hypertension   Last Blood Pressure: 153 / 101  (04/02/2010)   Serum creatinine: 0.71  (12/01/2009)   BMP action: Ordered   Serum potassium 4.7  (12/01/2009) CMP ordered     Hypertension flowsheet reviewed?: Yes   Progress toward BP goal: Unchanged  Self-Management Support :   Personal Goals (by the next clinic visit) :      Personal blood pressure goal: 130/80  (09/25/2009)     Personal LDL goal: 70  (09/25/2009)    Patient will work on the following items until the next clinic visit to reach self-care goals:     Medications and monitoring: take my medicines every day, check my blood pressure  (03/12/2010)     Eating: drink diet soda or water instead of juice or soda, eat more vegetables, eat foods that are low in salt, eat baked foods instead of fried  foods  (02/27/2010)     Activity: take a 30 minute walk every day  (03/12/2010)     Other: drink tea and coffee all day - just eat what they bring me - did not bring any meds - leg swelling has stopped her walking  (04/02/2010)    Hypertension self-management support: Pre-printed educational  material, Resources for patients handout, Written self-care plan  (04/02/2010)   Hypertension self-care plan printed.    Lipid self-management support: Pre-printed educational material, Resources for patients handout, Written self-care plan  (04/02/2010)   Lipid self-care plan printed.      Resource handout printed.   Nursing Instructions: Gyn referral for screening Pap (see order)    Process Orders Check Orders Results:     [facility]: ABN not required for this insurance Tests Sent for requisitioning (April 02, 2010 5:37 PM):     04/02/2010: [facility] -- T-Comprehensive Metabolic Panel [phone] (signed)

## 2011-01-29 NOTE — Miscellaneous (Signed)
Summary: Hosp. Admission: R leg DVT  INTERNAL MEDICINE TEACHING SERVICE ADMISSION HISTORY AND PHYSICAL  Attending:   Dr. Ulyess Mort 1st Contact: Dr. Denton Meek  [phone] 2nd Contact: Dr. Threasa Beards      [phone]  After 5 pm on weekdays, on weekends and holidays: 1st Contact: [phone] 2nd Contact: [phone]  PCP: [facility]  CC: R leg pain, chest pain, inadequate material resources  HPI: Patient is a 50 year old woman with a PMH of PVD, s/p bypass grafting of L leg with subsequent occlusion, HLD, HTN, continued tobacco abuse and severe depression and anxiety who came to clinic for a followup appointment and reported bilateral lower extremity pain, which has been chronic since 2009, but has progressively worsened over the past year. Lower extremity dopplers were ordered and showed multiple DVTs in the R lower extremity. On the floor, the patient reports that she began to notice this pain in the right groin, where she has had a stent place before for PVD. She states that the pain feels like "when you are dilated to a 7 during childbirth" and is also burning in quality. She also reports increased swelling in the L leg at the ankle. She does state that an increase in gabapentin helped somewhat with the pain before.  She also reports chest pain, mid sternal, for the past month. It is reproducible with palpation and feels "sore" or "bruised". She complains of pain both with and without a cough that she has had over the same time period. Its intensity is "off the chart" at times, but is relieved by using her inhalers more frequently. She states that the pain does radiate from the mid-sternum to her right breast where she has had some resection in the past of breast tissue. She states that she becomes short of breath with the pain and that she also gets very sweaty. She is extremely anxious secondary to her reported history of pulmonary edema, which gets her even more nervous  when she has the chest pain.  She also reports that she has been having incontinence with coughing and laughing and that she now has to wear pads or diapers secondary to this incontinence. She is worried that this incontinence may be a sign that her kidneys are failing.  Finally, throughout the history the patient was extremely tearful, easily distracted and had flight of thoughts requiring redirection countless times during history. She complains of lack of family support, insomnia, uncontrolled pain and inability to qualify for SSI/disabilty. She is upset that "junkies" can get SSI in one month and that she has been unable to qualify since she first sought SSI in 05/10 (per our records). She states that the "main thing wrong with me" is that bills are always due and that she has to help all of her children by caring for her grandkids and doing all their laundry among other things.   ALLERGIES: ! * DARVOCET ! * STRAWBERRIES ! * CATS ! * GIN  PAST MEDICAL HISTORY: Hypertension Peripheral vascular disease w/ intermittent claudication      -s/p L fem-pop bypass 11/21/08           graft occluded 12/28/08      -aortogram w/ bilat LE runoff           1.  bilat diffuse SFA occlusive dz           2.  Mod to severe above-knee popliteal dz.  3.  Bilat 3-vessel runoff w/ mild tibial occlusive dz. Tobacco abuse Irregular menses starting 09/08 Generalized anxiety Hyperlipidemia Knee pain with crepitus, b/l, worse in right knee, 03/10 Skin color changes left upper inner thigh following vascular surgery, 1st documented 03/10 S/p Right breast lumpectomy, June 2009:   - Fibroadenoma variant, 2.5 cm.   - Fibrocystic changes with focal usual ductal hyperplasia.   - Smaller fibroadenoma, incidental.   - There is no evidence of malignancy.  MEDICATIONS:  HYDROCHLOROTHIAZIDE 25 MG TABS (HYDROCHLOROTHIAZIDE) Take 1 tablet by mouth once a day LISINOPRIL 40 MG  TABS (LISINOPRIL) Take 1 tablet by  mouth once a day ASPIRIN 81 MG TABS (ASPIRIN) Take 1 tablet by mouth once a day HYDROCODONE-ACETAMINOPHEN 10-325 MG TABS (HYDROCODONE-ACETAMINOPHEN) take one tab by mouth once every 6 hours as needed for pain COREG 25 MG TABS (CARVEDILOL) take 1 pill by mouth two times a day. LIPITOR 40 MG TABS (ATORVASTATIN CALCIUM) take 1 pill by mouth daily. PROAIR HFA 108 (90 BASE) MCG/ACT AERS (ALBUTEROL SULFATE) Take 1-2 puffs as needed every 4-6 hours as needed for shortness of breath KLONOPIN 1 MG TABS (CLONAZEPAM) take 1 pill by mouth three times a day. NIACIN CR 1000 MG CR-TABS (NIACIN) take 1 pill by mouth daily. Take aspirin 81 mg 30 minutes before taking niacin. NEURONTIN 600 MG TABS (GABAPENTIN) take 1 pill by mouth three times a day. NORVASC 5 MG TABS (AMLODIPINE BESYLATE) take 1 pill by mouth daily. SEROQUEL 100 MG TABS (QUETIAPINE FUMARATE) take 1 pill at bedtime, per mental health. PROMETHAZINE HCL 25 MG TABS (PROMETHAZINE HCL) take 1 pill by mouth three times a day as needed for vomiting. ROBITUSSIN DM 100-10 MG/5ML SYRP (DEXTROMETHORPHAN-GUAIFENESIN) take 10 cc by mouth three times a day as needed for cough. CELEXA 40 MG TABS (CITALOPRAM HYDROBROMIDE) Take 1 tablet by mouth once a day HYDROXYZINE PAMOATE 25 MG CAPS (HYDROXYZINE PAMOATE) Take one tab by mouth every 6 hours as needed for anxiety ALPRAZOLAM 0.5 MG TABS (ALPRAZOLAM) take 1 pill by mouth three times a day as needed for anxiety.  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  VITALS: T: 98.5 P: 75 BP: 161/91 R: 16 O2SAT: 95% on RA PHYSICAL EXAM: General:  alert, excessively tearful, and cooperative to examination.   Head:  normocephalic and  atraumatic.   Eyes:  vision grossly intact, mild lid swelling bilaterally, likely secondary to excessive crying and rubbing of eyes.   Mouth:  mucous membranes pink and moist.   Neck:  supple, full ROM.   Lungs:  CTA anteriorly, bilaterally. Heart:  normal rate, regular rhythm, no murmur, no gallop, and no rub.   Abdomen:  soft, non-tender, normal bowel sounds, no distention, no guarding, no rebound tenderness.   Msk:  no joint swelling, no joint warmth, and no redness over joints.   Pulses:  2+ DP pulse on L, 1+ DP pulse on R, 2+ radial pulses bilaterally. Extremities:  Warm, dry, no cyanosis. Mild non-pitting swelling over L ankle. Scar over medial L calf where bypass surgery was performed. Neurologic:  alert & oriented X3, cranial nerves II-XII intact, strength normal in all extremities, sensation intact to light touch, and gait normal.  Skin:  turgor normal and no rashes.   Psych:  Memory intact, flight of thoughts, need for constant redirection. Excessive crying and reference to feeling worthless. No stated suicidal ideations when asked, but patient diverted from the question several times. Apparent anxiety from lack of familial support and inadequate material resources, states that her main problem is lack of SSI and bills being due.  LABS: WBC                                      8.9               4.0-10.5         K/uL RBC                                      4.04              3.87-5.11        MIL/uL Hemoglobin (HGB)                         13.7              12.0-15.0        g/dL Hematocrit (HCT)                         40.3              36.0-46.0        % MCV                                      99.7              78.0-100.0       fL MCHC                                     34.1              30.0-36.0        g/dL RDW                                      13.5              11.5-15.5        % Platelet Count (PLT)                     213               150-400          K/u  Sodium (NA)                               139               135-145          mEq/L Potassium (K)  3.2        l      3.5-5.1          mEq/L Chloride                                 105               96-112           mEq/L CO2                                      26                19-32            mEq/L Glucose                                  105        h      70-99            mg/dL BUN                                      5          l      6-23             mg/dL Creatinine                               0.67              0.4-1.2          mg/dL GFR, Est Non African American            >60               >60              mL/min GFR, Est African American                >60               >60              mL/min   Oversized comment, see footnote  1 Bilirubin, Total                         0.4               0.3-1.2          mg/dL Alkaline Phosphatase                     58                39-117           U/L SGOT (AST)                               18                0-37  U/L SGPT (ALT)                               18                0-35             U/L Total  Protein                           7.5               6.0-8.3          g/dL Albumin-Blood                            3.8               3.5-5.2          g/dL Calcium                                  9.3               8.4-10.5         mg/dL  PTT(a-Partial Thromboplastn Time)        32                24-37            seconds Protime ( Prothrombin Time)              13.4              11.6-15.2        seconds INR                                      1.03              0.00-1.49  Creatine Kinase, Total                   135               7-177            U/L CK, MB                                   1.1               0.3-4.0          ng/mL Relative Index                           0.8               0.0-2.5 Troponin I                               0.01              0.00-0.06        ng/mL  CXR 2-VIEW: No active cardiopulmonary  disease.  Lower extremity dopplers: - Findings consistent with subacute deep vein thrombosis involving   the right  lower extremity. - No evidence of deep vein thrombosis involving the left lower   extremity. - No evidence of Baker's cyst on the right or left.  ASSESSMENT AND PLAN: 1. Right lower extremity DVTs: DVTs found in right common femoral, right femoral, and right popliteal veins via doppler imaging. Patient with increased pain in R leg, likely secondary to the thromboses found. Patient will be started on enoxaparin and warfarin for anticoagulation and hypercoagulable panel will be drawn. Current coag studies are all normal. 2. Chest pain/SOB: Atypical chest pain, but with some concerning features. May be related to anxiety or secondary to lumpectomy in R breast in 2009 with residual pain. Patient does affirm SOB and diaphoresis and newly diagnosed DVTs, concern for pulmonary embolism prompted CT Angio. Cardiac enzymes were negative. Will get an EKG. CXR without any acute cardiopulmonary disease. 3. Depression/anxiety: This appears to be severe and debilitating for the patient. Sitter ordered for some comments made to nursing, although patient never directly answered questions when asked about any suicidal ideation. Will consult psychiatry and continue her psych medications. 4. Inadequate material resources:  Will consult SW for help with regard to patient's application for SSI/disability. 5. HTN: Continue home medications. 6. Chronic lower extremity pain: Continue with home pain medications, may need to be adjusted if pain control insufficient.  Attending Physician: I performed and/or observed a history and physical examination of the patient. I discussed the case with the residents as noted and reviewed the residents' notes. I agree with the findings and plan -- please refer to the attending physician note for more details.   Signature: _________________________   Printed name:  ________________________

## 2011-01-29 NOTE — Progress Notes (Signed)
Summary: phone/gg  Phone Note Call from Patient   Caller: Daughter Summary of Call: Pt's daughter brought in FMLA paperwork to be filled out ASAP. She states she lives with [place] and has to miss work when her mother needs appointments or is off balance from her meds.  The paperwork is in your box.  I will call daughter, Lajoyce Corners, when it is complete. # [id number] Initial call taken by: Merrie Roof RN,  January 26, 2010 1:47 PM  Follow-up for Phone Call        I will fill it when I am in clinic next time.  Follow-up by: Jason Coop MD,  January 26, 2010 7:37 PM     Appended Document: phone/gg Pt's daughter had brought in the incorrect FMLA paperwork.  She will return with the correct forms.

## 2011-01-29 NOTE — Progress Notes (Signed)
Summary: refill/ hla  Phone Note Refill Request Message from:  Patient on June 20, 2010 3:43 PM  Refills Requested: Medication #1:  COUMADIN 5 MG TABS Take 1 tablet by mouth once a day  Medication #2:  PERCOCET 5-325 MG TABS take 1 pill by mouth as needed up to every 6 hourly.. Initial call taken by: Marin Roberts RN,  June 20, 2010 3:43 PM    Prescriptions: COUMADIN 5 MG TABS (WARFARIN SODIUM) Take 1 tablet by mouth once a day  #31 x 6   Entered and Authorized by:   Zoila Shutter MD   Signed by:   Zoila Shutter MD on 06/20/2010   Method used:   Electronically to        [facility] (retail)       [address]       [place], [place]  [id number]       Ph: [phone]       Fax: [phone]   RxID:   [id number]

## 2011-01-29 NOTE — Assessment & Plan Note (Signed)
Summary: ACUTE-1 MONTH CHECK UP/CFB(WOODYEAR)   Vital Signs:  Patient profile:   50 year old female Height:      64 inches (162.56 cm) Weight:      249.06 pounds (113.21 kg) BMI:     42.91 O2 Sat:      99 % on Room air Temp:     98.1 degrees F (36.72 degrees C) oral Pulse rate:   81 / minute BP sitting:   158 / 106  (right arm) Cuff size:   large  Vitals Entered By: Angelina Ok RN (October 09, 2010 10:22 AM)  O2 Flow:  Room air CC: Depression Is Patient Diabetic? No Pain Assessment Patient in pain? yes     Location: legs Intensity: 9 Type: aching, burning Onset of pain  Constant Nutritional Status BMI of > 30 = obese  Have you ever been in a relationship where you felt threatened, hurt or afraid?No   Does patient need assistance? Functional Status Self care Ambulation Normal Comments Wants to get refferral to Sports Medicine.  Needs refills. Talk about her nerves.   Primary Care Provider:  Zoila Shutter MD  CC:  Depression.  History of Present Illness: 1. Severe anxiety attacks. Hx of panick disorder and depression. Being followed by a psychiatrist at [facility] department. Patient has a scheduled appointment in November. Patient states worsening of Sx --poor psychosocial and economic environment. Patient denies any harm to herself or others. Requests increase in dose of her klonopin and xanax. 2. Left lower extremity pain, chronic. 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 Completed 6 months of coumadin therapy. Denies any increase in swelling, paink, SOB, CP, palpitations, fever, or chills. Requests a referral go back and see her vascular surgeon.  Depression History:      The patient is having a depressed mood most of the day and has a diminished interest in her usual daily activities.        The patient denies that she feels like life is not worth living, denies that she wishes that she were dead, and denies that she has thought about ending her life.          Preventive Screening-Counseling & Management  Alcohol-Tobacco     Alcohol drinks/day: 0     Smoking Status: current     Smoking Cessation Counseling: yes     Packs/Day: 1 pp 2 weeks     Year Started: 1/4 pack month  Comments: Increased smoking sometimes a pack.  Thinking about the Patch.  Allergies: 1)  ! * Darvocet 2)  ! * Strawberries 3)  ! * Cats 4)  ! * Gin   Impression & Recommendations:  Problem # 1:  LEG PAIN, BILATERAL (ICD-729.5) Assessment Unchanged Chronic. Hx of PVD. Per patient's request, will referr back to a vascular surgeon. Percocet refilled. Risks for addiction, sedation discussed with the patient. Pain contract is on file (08/2010). Orders: Vascular Clinic (Vascular)  Problem # 2:  DEPRESSION (ICD-311) Assessment: Deteriorated  Will increase doses of Klonopin and Xanax. Patient is advised to follow up with her psychiatrist ASAP. Instructed to call 911 or go to ED if panic attaacks worsen. Her updated medication list for this problem includes:    Klonopin 1 Mg Tabs (Clonazepam) .Marland Kitchen... Take 1 pill by mouth four times a day.    Alprazolam 0.5 Mg Tabs (Alprazolam) .Marland Kitchen... Take 1 pill by mouth three times a day as needed for anxiety.    Cymbalta 60 Mg Cpep (Duloxetine  hcl) ..... One 2 times a day    Mirtazapine 15 Mg Tabs (Mirtazapine) .Marland Kitchen... 1/2 at night    Alprazolam 1 Mg Tabs (Alprazolam) .Marland Kitchen... Three times a day  Discussed treatment options, including trial of antidpressant medication. Will refer to behavioral health. Follow-up call in in 24-48 hours and recheck in 2 weeks, sooner as needed. Patient agrees to call if any worsening of symptoms or thoughts of doing harm arise. Verified that the patient has no suicidal ideation at this time.   Problem # 3:  Preventive Health Care (ICD-V70.0) Assessment: Comment Only Strongly advised to follow up with [facility], mammogram, FOBT, eye exam. WEight management discussion deferred due to the patinet's anxiety.  Complete  Medication List: 1)  Hydrochlorothiazide 25 Mg Tabs (Hydrochlorothiazide) .... Take 1 tablet by mouth once a day 2)  Lisinopril 40 Mg Tabs (Lisinopril) .... Take 1 tablet by mouth once a day 3)  Aspirin 81 Mg Tabs (Aspirin) .... Take 1 tablet by mouth once a day 4)  Coreg 25 Mg Tabs (Carvedilol) .... Take 1 pill by mouth two times a day. 5)  Lipitor 40 Mg Tabs (Atorvastatin calcium) .... Take 1 pill by mouth daily. 6)  Proair Hfa 108 (90 Base) Mcg/act Aers (Albuterol sulfate) .... Take 1-2 puffs as needed every 4-6 hours as needed for shortness of breath 7)  Klonopin 1 Mg Tabs (Clonazepam) .... Take 1 pill by mouth four times a day. 8)  Niacin Cr 1000 Mg Cr-tabs (Niacin) .... Take 1 pill by mouth daily. take aspirin 81 mg 30 minutes before taking niacin. 9)  Neurontin 800 Mg Tabs (Gabapentin) .... Take 1 pill by mouth three times a day. 10)  Norvasc 10 Mg Tabs (Amlodipine besylate) .... Take 1 pill by mouth daily. 11)  Promethazine Hcl 25 Mg Tabs (Promethazine hcl) .... Take 1 pill by mouth three times a day as needed for vomiting. 12)  Robitussin Dm 100-10 Mg/84ml Syrp (Dextromethorphan-guaifenesin) .... Take 10 cc by mouth three times a day as needed for cough. 13)  Alprazolam 0.5 Mg Tabs (Alprazolam) .... Take 1 pill by mouth three times a day as needed for anxiety. 14)  Cymbalta 60 Mg Cpep (Duloxetine hcl) .... One 2 times a day 15)  Coumadin 5 Mg Tabs (Warfarin sodium) .... Take 1 tablet by mouth once a day 16)  Percocet 5-325 Mg Tabs (Oxycodone-acetaminophen) .... Take 1 pill by mouth as needed up to every 6 hourly. 17)  Geodon 60 Mg Caps (Ziprasidone hcl) .... One 2 times a day 18)  Mirtazapine 15 Mg Tabs (Mirtazapine) .... 1/2 at night 19)  Zolpidem Tartrate 10 Mg Tabs (Zolpidem tartrate) .Marland Kitchen.. 1 at night as needed 20)  Seroquel 100 Mg Tabs (Quetiapine fumarate) .... 1/2-1 at night as needed 21)  Alprazolam 1 Mg Tabs (Alprazolam) .... Three times a day  Patient Instructions: 1)  Please,  follow up with your psychiatrist. 2)  If you feel worse --go to ER. 3)  Please, follow up with Dr. Coralee Pesa on as needed basis. 4)  Please, follow up with your referrals for a well woman exam, mammogram. Prescriptions: PERCOCET 5-325 MG TABS (OXYCODONE-ACETAMINOPHEN) take 1 pill by mouth as needed up to every 6 hourly.  #120 x 0   Entered by:   Deatra Robinson MD   Authorized by:   Laren Everts MD   Signed by:   Deatra Robinson MD on 10/09/2010   Method used:   Print then Give to Patient   RxID:  [id number] ALPRAZOLAM 1 MG TABS (ALPRAZOLAM) three times a day  #90 x 11   Entered by:   Deatra Robinson MD   Authorized by:   Laren Everts MD   Signed by:   Deatra Robinson MD on 10/09/2010   Method used:   Print then Give to Patient   RxID:   [id number] KLONOPIN 1 MG TABS (CLONAZEPAM) take 1 pill by mouth four times a day. Brand medically necessary #120 x 0   Entered by:   Deatra Robinson MD   Authorized by:   Laren Everts MD   Signed by:   Deatra Robinson MD on 10/09/2010   Method used:   Print then Give to Patient   RxID:   [id number]    Vital Signs:  Patient profile:   50 year old female Height:      64 inches (162.56 cm) Weight:      249.06 pounds (113.21 kg) BMI:     42.91 O2 Sat:      99 % Temp:     98.1 degrees F (36.72 degrees C) oral Pulse rate:   81 / minute BP sitting:   158 / 106  (right arm) Cuff size:   large  Vitals Entered By: Angelina Ok RN (October 09, 2010 10:22 AM)  O2 Flow:  Room air   Prevention & Chronic Care Immunizations   Influenza vaccine: Fluvax 3+  (09/25/2009)   Influenza vaccine deferral: Deferred  (04/02/2010)   Influenza vaccine due: 08/31/2011    Tetanus booster: 01/12/2010: Tdap   Td booster deferral: Not indicated  (07/05/2010)   Tetanus booster due: 01/13/2020    Pneumococcal vaccine: Not documented   Pneumococcal vaccine deferral: Not indicated  (07/05/2010)  Other Screening    Pap smear: Not documented   Pap smear action/deferral: GYN Referral  (04/02/2010)    Mammogram: Assessment: BIRADS 4. Suspicious.  IMPRESSION: 1.  Lobulated heterogeneous solid and cystic mass 10 o'clock right  breast measuring approximately  4.6 cm in greatest dimension is indeterminate.  Malignancy is not  excluded.  Ultrasound-guided core needle biopsy is recommended. 2.  Prominent lymph node in axillary tail of the right breast.   Particularly in light of the  findings in the 10 o'clock region of the right breast,  ultrasound-guided core needle biopsy of this lymph node is suggested at the time of biopsy. 3.  No evidence of malignancy in the left breast.   Recommend ultrasound-guided core needle biopsy of palpable right  breast mass and prominent lymph  node in the axillary tail of the right breast.  (03/10/2008)   Mammogram action/deferral: Ordered  (08/08/2010)   Smoking status: current  (10/09/2010)   Smoking cessation counseling: yes  (10/09/2010)  Lipids   Total Cholesterol: 121  (08/08/2010)   Lipid panel action/deferral: Lipid Panel ordered   LDL: 62  (08/08/2010)   LDL Direct: Not documented   HDL: 30  (08/08/2010)   Triglycerides: 145  (08/08/2010)   Lipid panel due: 08/09/2011    SGOT (AST): 14  (08/08/2010)   BMP action: Ordered   SGPT (ALT): 16  (08/08/2010)   Alkaline phosphatase: 76  (08/08/2010)   Total bilirubin: 0.3  (08/08/2010)   Liver panel due: 08/09/2011    Lipid flowsheet reviewed?: Yes   Progress toward LDL goal: At goal    Stage of readiness to change (lipid management): Maintenance  Hypertension   Last Blood Pressure: 158 / 106  (10/09/2010)   Serum creatinine: 0.73  (08/08/2010)   BMP  action: Ordered   Serum potassium 3.8  (08/08/2010)   Basic metabolic panel due: 08/09/2011    Hypertension flowsheet reviewed?: Yes   Progress toward BP goal: Deteriorated    Stage of readiness to change (hypertension management):  Action  Self-Management Support :   Personal Goals (by the next clinic visit) :      Personal blood pressure goal: 130/80  (09/25/2009)     Personal LDL goal: 70  (09/25/2009)    Patient will work on the following items until the next clinic visit to reach self-care goals:     Medications and monitoring: take my medicines every day, bring all of my medications to every visit  (10/09/2010)     Eating: drink diet soda or water instead of juice or soda, eat more vegetables, use fresh or frozen vegetables, eat foods that are low in salt, eat baked foods instead of fried foods, eat fruit for snacks and desserts, limit or avoid alcohol  (10/09/2010)     Activity: take a 30 minute walk every day  (10/09/2010)     Other: drink tea and coffee all day - just eat what they bring me - did not bring any meds - leg swelling has stopped her walking  (04/02/2010)    Hypertension self-management support: Written self-care plan, Education handout, Pre-printed educational material, Resources for patients handout  (10/09/2010)   Hypertension self-care plan printed.   Hypertension education handout printed    Lipid self-management support: Written self-care plan, Education handout, Pre-printed educational material, Resources for patients handout  (10/09/2010)   Lipid self-care plan printed.   Lipid education handout printed      Resource handout printed.   Nursing Instructions: Give Flu vaccine today

## 2011-01-29 NOTE — Progress Notes (Signed)
Summary: refill/ hla  Phone Note Refill Request Message from:  Fax from Pharmacy on January 31, 2010 12:22 PM  Refills Requested: Medication #1:  KLONOPIN 1 MG TABS take 1 pill by mouth three times a day.   Last Refilled: 12/30 Initial call taken by: Marin Roberts RN,  January 31, 2010 12:22 PM  Follow-up for Phone Call       Follow-up by: Jason Coop MD,  February 01, 2010 5:03 PM    Prescriptions: KLONOPIN 1 MG TABS (CLONAZEPAM) take 1 pill by mouth three times a day.  #90 x 0   Entered and Authorized by:   Jason Coop MD   Signed by:   Jason Coop MD on 02/01/2010   Method used:   Telephoned to ...       [facility]       [address]       [place], [place]  [address]       Ph: [phone]       Fax: [phone]   RxID:   [id number]

## 2011-01-29 NOTE — Progress Notes (Signed)
  Phone Note Call from Patient   Caller: Patient Call For: Jason Coop MD Details for Reason: [facility] PAPERS Summary of Call: Patient has called again in regards to her daughter's Taiana Temkin) FMLA papework.  She stated that she has been waiting for you to complete them since 05/11/2010.   She said that her daughter has now moved in with her, and will be responsible for taking care of her and transporting her to and from her Dr.'s appointments.  She states that this is very urgent because she is in danger of losing her job because the papers have not been completed. Initial call taken by: Shon Hough,  May 14, 2010 4:19 PM  Follow-up for Phone Call        I have never seen Ms. Vary coming with her daughter to the clinic and unfortunately I have to decline to sign the paperwork at this time.  Follow-up by: Jason Coop MD,  May 15, 2010 9:01 AM

## 2011-01-29 NOTE — Progress Notes (Signed)
Summary: phone/gg  Phone Note Call from Patient   Summary of Call: Pt receives her medicine from the [facility]. On June 3rd she brought her percocet Rx for # 120 to the health dept and they only had enough supply to give her # 74.  Pharmacy is closing and may not have this med anymore.  Pt will need a new hardcopy for percocet # 39 to equal her months supply.  She will have to get it filled at another pharmacy. Will you write for this?  I called [facility] and they did verify pt was only given Percocet # 81 Initial call taken by: Merrie Roof RN,  June 07, 2010 2:12 PM    Prescriptions: PERCOCET 5-325 MG TABS (OXYCODONE-ACETAMINOPHEN) take 1 pill by mouth as needed up to every 6 hourly.  #39 x 0   Entered and Authorized by:   Jason Coop MD   Signed by:   Jason Coop MD on 06/07/2010   Method used:   Print then Give to Patient   RxID:   [id number]

## 2011-01-29 NOTE — Progress Notes (Signed)
Summary: phone/gg  Phone Note Call from Patient   Caller: Patient Summary of Call: Pt called and states she was in ED saturday night. She was treated for chest pain and assessed for leg swelling and panic attack.  Pt sent home at 0200.  During  stay her daughter ,[name] stayed with her and she stayed with pt all yesterday to monitor pt.  Now pt wants a letter written for her daughter to be excused from work at [employer] on Sunday May 1st.  Pt states she asked the ED doctor to write the excuse and they told her that her PCP would have to write the note.   Note will be to [place] excusing Alisha [name] from work onsunday May 1st as she was out helping her sick Mother. Pt # [id number] Initial call taken by: Merrie Roof RN,  Apr 30, 2010 9:15 AM  Follow-up for Phone Call        Please find my letter.  Follow-up by: Jason Coop MD,  Apr 30, 2010 12:17 PM
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
## 2011-01-29 NOTE — Miscellaneous (Signed)
Summary: MEDICATION CONTRACT  MEDICATION CONTRACT   Imported By: Louretta Parma 08/09/2010 10:46:06  _____________________________________________________________________  External Attachment:    Type:   Image     Comment:   External Document
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
## 2011-01-29 NOTE — Letter (Signed)
Summary: [facility]   Imported By: Margie Billet 06/04/2010 10:44:33  _____________________________________________________________________  External Attachment:    Type:   Image     Comment:   External Document

## 2011-01-29 NOTE — Medication Information (Signed)
Summary: Tax adviser   Imported By: Florinda Marker 03/19/2010 13:42:55  _____________________________________________________________________  External Attachment:    Type:   Image     Comment:   External Document

## 2011-01-29 NOTE — Letter (Signed)
Summary: Generic Letter  [facility]  [address]   [place], [place] [address]   Phone: [phone]  Fax: [phone]    04/30/2010  [facility] [address] [place], [place]  [address]  To whom it may concern,  This is to inform that Ms. Skila Rollins was seen in [facility] Emergency Department on 04/29/10 along with her daughter, Ms. Delray Alt who accompanied Ms. Virgina Evener to the hospital.       Sincerely,   Jason Coop MD

## 2011-01-29 NOTE — Progress Notes (Signed)
Summary: REfill/gh  Phone Note Refill Request Message from:  Fax from Pharmacy on June 01, 2010 12:43 PM  Refills Requested: Medication #1:  NIACIN CR 1000 MG CR-TABS take 1 pill by mouth daily. Take aspirin 81 mg 30 minutes before taking niacin.   Last Refilled: 04/11/2010  Method Requested: Fax to Local Pharmacy Initial call taken by: Angelina Ok RN,  June 01, 2010 12:43 PM  Follow-up for Phone Call       Follow-up by: Jason Coop MD,  June 01, 2010 4:07 PM    Prescriptions: NIACIN CR 1000 MG CR-TABS (NIACIN) take 1 pill by mouth daily. Take aspirin 81 mg 30 minutes before taking niacin.  #30 x 0   Entered and Authorized by:   Jason Coop MD   Signed by:   Jason Coop MD on 06/01/2010   Method used:   Faxed to ...       [facility] (retail)       [address] [place], [place]  [address]       Ph: [phone]       Fax: [phone]   RxID:   [id number]
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
## 2011-01-29 NOTE — Assessment & Plan Note (Signed)
Summary: CHECKUP/SB.   Vital Signs:  Patient profile:   50 year old female Height:      64 inches (162.56 cm) Weight:      252.2 pounds (114.64 kg) BMI:     43.45 Temp:     97.7 degrees F (36.50 degrees C) oral Pulse rate:   79 / minute BP sitting:   147 / 96  (left arm) Cuff size:   large  Vitals Entered By: [name]) (November 06, 2010 10:57 AM) CC: med refill Nutritional Status BMI of > 30 = obese  Have you ever been in a relationship where you felt threatened, hurt or afraid?No   Does patient need assistance? Functional Status Self care Ambulation Normal   Primary Care Provider:  Zoila Shutter MD  CC:  med refill.  History of Present Illness: 50yr old pt. who comes in for her monthly follow up. Unfortunately she had a THIRD UDS done today that is negative for opiates and oxycodone and benzodiazepines. I went into the room with a copy of the medication contract from 8/11 and this information. Ms. Amspacher had told me each of the last 2 times that I had checked these that she was taking these medications, and she said the same thing this morning as well.  However as I talk to her a bit more it becomes clear that she has been running out of her percocet and her clonipin and aprazolam before the end of the month and that may be the reason that her UDS's have been negative.   She also mentions that she received a letter from disability saying that things were "fully favorable."  Allergies: 1)  ! * Darvocet 2)  ! * Strawberries 3)  ! * Cats 4)  ! * Gin  Past History:  Past Medical History: Reviewed history from 02/27/2009 and no changes required. Hypertension Peripheral vascular disease w/ intermittent claudication      -s/p L fem-pop bypass 11/21/08           graft occluded 12/28/08      -aortogram w/ bilat LE runoff           1.  bilat diffuse SFA occlusive dz           2.  Mod to severe above-knee popliteal dz.           3.  Bilat 3-vessel runoff w/  mild tibial occlusive dz. Tobacco abuse Irregular menses starting 09/08 Generalized anxiety Hyperlipidemia Knee pain with crepitus, b/l, worse in right knee, 03/10 SKin color changes left upper inner thigh following vascular surgery, 1st documented 03/10  Social History: Reviewed history from 09/11/2010 and no changes required. 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 CV:  Denies chest pain or discomfort. Resp:  Denies shortness of breath. GI:  Denies abdominal pain. Neuro:  Denies numbness and weakness. Psych:  Complains of anxiety and easily tearful.  Physical Exam  Head:  normocephalic and atraumatic.   Eyes:  vision grossly intact.   Neck:  supple.   Lungs:  normal respiratory effort and normal breath sounds.   Heart:  normal rate and regular rhythm.   Abdomen:  soft, non-tender, and normal bowel sounds.  Psych:  depressed affect.  tearful, anxious, labile mood,   Impression & Recommendations:  Problem # 1:  CHRONIC PAIN SYNDROME (ICD-338.4) Greater than 45 minutes spent with >50% spent face to face with counseling and discussing the fact that she had violated the pain contract  and that she has been take more medicine than prescribed and running out of it early. At first I told her that I would not give her any more of her narcotics or benzodiazepines, but as we talked about this at length I believe there is some chance that her urines have been negative because she has run out of medicine before the month is over, thus before she returns to clinic. I have told her that I will only fill one of her benzodiazepines, as I think she should only be on 60from a standard of care standpoint. Last  month when she was seen the resident went up on the dose of her xanax and the freqeuncy of her clonipin. She has decided to continue the xanax for now. I will leave it at #90 as was given last month as she was taking the clonipin three times a day for some time. . She says she has been off of the percocet for 4-6 days and the benzo's 2days  and is showing no signs of withdrawal. She became inconsolable when I told her at first that I couln't give her meds that were't showing up in her urine. SHE HAS BEEN TOLD IF THERE ARE ANY OTHER VIOLATIONS OF THE MEDICATION CONTRACT THEN SHE WILL NO LONGER RECEIVE ANY CONTROLLED SUBSTANCES FROM OUR CLINIC. She is to return in 1 month and at that time a UDS will be done. Orders: T-Drug Screen-Urine, (single) [id number]) T- * Misc. 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 I did receive some records from the [facility]. They were cryptic however and mostly documented changes in her meds. As I have told her before, I believe someone with her degree of psychiatric illness should be having their benzos handled by their psychiatrist and may work in that direction. I do think a large part of her problems are related to her psychiatric illness. The following medications were removed from the medication list:    Klonopin 1 Mg Tabs (Clonazepam) .Marland Kitchen... Take 1 pill by mouth four times a day.    Alprazolam 1 Mg Tabs (Alprazolam) .Marland Kitchen... Three times a day Her updated medication list for this problem includes:    Alprazolam 0.5 Mg Tabs (Alprazolam) .Marland Kitchen... Take 1 pill by mouth three times a day as needed for anxiety.    Cymbalta 60 Mg Cpep (Duloxetine hcl) ..... One 2 times a day    Mirtazapine 15 Mg Tabs (Mirtazapine) .Marland Kitchen... 1/2 at night  Problem # 3:  HYPERTENSION (ICD-401.9) This is up a little today, but I am certain it is secondaryto the  surrounding circumstances. Will follow. Her updated medication list for this problem includes:    Hydrochlorothiazide 25 Mg Tabs (Hydrochlorothiazide) .Marland Kitchen... Take 1 tablet by mouth once a day    Coreg 25 Mg Tabs (Carvedilol) .Marland Kitchen... Take 1 pill by mouth two times a day.    Norvasc 10 Mg Tabs (Amlodipine besylate) .Marland Kitchen... Take 1 pill by mouth daily.    Accupril 40 Mg Tabs (Quinapril hcl) ..... One a day  Complete Medication List: 1)  Hydrochlorothiazide 25 Mg Tabs (Hydrochlorothiazide) .Marland KitchenMarland KitchenMarland Kitchen  Take 1 tablet by mouth once a day 2)  Aspirin 81 Mg Tabs (Aspirin) .... Take 1 tablet by mouth once a day 3)  Coreg 25 Mg Tabs (Carvedilol) .... Take 1 pill by mouth two times a day. 4)  Lipitor 40 Mg Tabs (Atorvastatin calcium) .... Take 1 pill by mouth daily. 5)  Proair Hfa 108 (90 Base) Mcg/act Aers (Albuterol sulfate) .... Take 1-2 puffs as needed every 4-6 hours as needed for shortness of breath 6)  Niacin Cr 1000 Mg Cr-tabs (Niacin) .... Take 1 pill by mouth daily. take aspirin 81 mg 30 minutes before taking niacin. 7)  Neurontin 800 Mg Tabs (Gabapentin) .... Take 1 pill by mouth three times a day. 8)  Norvasc 10 Mg Tabs (Amlodipine besylate) .... Take 1 pill by mouth daily. 9)  Promethazine Hcl 25 Mg Tabs (Promethazine hcl) .... Take 1 pill by mouth three times a day as needed for vomiting. 10)  Robitussin Dm 100-10 Mg/21ml Syrp (Dextromethorphan-guaifenesin) .... Take 10 cc by mouth three times a day as needed for cough. 11)  Alprazolam 0.5 Mg Tabs (Alprazolam) .... Take 1 pill by mouth three times a day as needed for anxiety. 12)  Cymbalta 60 Mg Cpep (Duloxetine hcl) .... One 2 times a day 13)  Coumadin 5 Mg Tabs (Warfarin sodium) .... Take 1 tablet by mouth once a day 14)  Percocet 5-325 Mg Tabs (Oxycodone-acetaminophen) .... Take 1 pill by mouth as needed up to every 6 hourly. 15)  Geodon 60 Mg Caps (Ziprasidone hcl) .... One 2 times a day 16)  Mirtazapine 15 Mg Tabs (Mirtazapine) .... 1/2 at night 17)   Zolpidem Tartrate 10 Mg Tabs (Zolpidem tartrate) .Marland Kitchen.. 1 at night as needed 18)  Seroquel 100 Mg Tabs (Quetiapine fumarate) .... 1/2-1 at night as needed 19)  Accupril 40 Mg Tabs (Quinapril hcl) .... One a day  Patient Instructions: 1)  Please schedule a follow-up appointment in 1 month. Prescriptions: ZOLPIDEM TARTRATE 10 MG TABS (ZOLPIDEM TARTRATE) 1 at night as needed  #20 x 3   Entered by:   Cynda Familia (AAMA)   Authorized by:   Zoila Shutter MD   Signed by:   Zoila Shutter MD on 11/07/2010   Method used:   Telephoned to ...       [facility]* (retail)       [address]       [address] Shipping/mailing       [place], [place]  [address]       Ph: [phone]       Fax: [phone]   RxID:   [id number] ALPRAZOLAM 0.5 MG TABS (ALPRAZOLAM) take 1 pill by mouth three times a day as needed for anxiety.  #60 x 3   Entered by:   Cynda Familia (AAMA)   Authorized by:   Zoila Shutter MD   Signed by:   Zoila Shutter MD on 11/07/2010   Method used:   Telephoned to ...       [facility]* (retail)       [address]       [address] Shipping/mail[address]       [place], [place]  [address]       Ph: [phone]       Fax: [phone]   RxID:   [id number] PERCOCET 5-325 MG TABS (OXYCODONE-ACETAMINOPHEN) take 1 pill by mouth as needed up to every 6 hourly.  #  120 x 0   Entered by:   Cynda Familia (AAMA)   Authorized by:   Zoila Shutter MD   Signed by:   Cynda Familia (AAMA) on 11/06/2010   Method used:   Reprint   RxID:   [id number] PERCOCET 5-325 MG TABS (OXYCODONE-ACETAMINOPHEN) take 1 pill by mouth as needed up to every 6 hourly.  #120 x 0   Entered and Authorized by:   Zoila Shutter MD   Signed by:   Zoila Shutter MD on 11/06/2010   Method used:   Print then Give to Patient   RxID:   [id number]  Percocet rx reprinted due to copier error .  Rx for ambien and alprazolam called into  the phamacy.Cynda Familia [facility])  November 06, 2010 12:15 PM   Orders Added: 1)  T-Drug Screen-Urine, (single) [[id number]] 2)  T- * Misc. Laboratory test [[id number]] 3)  Est. Patient Level V [99215]   Process Orders Check Orders Results:     [facility]: ABN not required for this insurance Tests Sent for requisitioning (November 07, 2010 8:05 PM):     11/06/2010: [facility] -- T-Drug Screen-Urine, (single) [[id number]] (signed)     11/06/2010: [facility] -- T- * Misc. Laboratory test [phone] (signed)

## 2011-01-29 NOTE — Miscellaneous (Signed)
Summary: Orders Update  Clinical Lists Changes  Orders: Added new Referral order of Social Work Referral (Social ) - Signed 
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
## 2011-01-29 NOTE — Assessment & Plan Note (Signed)
Summary: REASSIGNED NEW TO DR/CFB   Vital Signs:  Patient profile:   51 year old female Height:      64 inches (162.56 cm) Weight:      227.8 pounds (103.55 kg) BMI:     39.24 Temp:     98.7 degrees F (37.06 degrees C) oral Pulse rate:   77 / minute BP sitting:   173 / 104  (left arm) Cuff size:   large  Vitals Entered By: Alisha [name]) (July 05, 2010 11:03 AM) CC: new to md, requests refill on meds, states she suffering from withdrawal, c/o left leg pain/swelling, no coumadin for atleast a week Is Patient Diabetic? No Pain Assessment Patient in pain? yes     Location: left leg Intensity: 8 Type: sharp Onset of pain  Chronic Nutritional Status BMI of > 30 = obese  Have you ever been in a relationship where you felt threatened, hurt or afraid?No   Does patient need assistance? Functional Status Self care Ambulation Normal   Primary Care Provider:  Zoila Shutter MD  CC:  new to md, requests refill on meds, states she suffering from withdrawal, c/o left leg pain/swelling, and no coumadin for atleast a week.  History of Present Illness: 50 year old patient who comes in to establish with new PCP. My first contact with her was on 06/20/10 when she contacted the triage nurse saying "she had left a bag with all of her medications on a bus and she needed them all refilled." As her meds included 4 controlled substances: PERCOCET, CLONIPIN, APRAZOLAM, and AMBIEN all of which had been filled 13 days prior, I told her I would be unable to fill those. She comes in today (16 days later) off of them all, saying she is withdrawing, but looking quite good.  Alisha [name] has a complicated PMHx, probably the first thing the stands out is her psychiatric history. 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 She is followed at mental health. It is not clear to me why they are not filling all of her psychotropic meds, or why she is on2  benzodiazepines. When I asked her about it, "they" doctors here? said she needed both. 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 Her next appt. at the [facility] is 7/26 at8:30AM. The last MD she saw there was ?Alisha [name].  She has a history of a fempop surgery,  that apparently occluded just after surgery, and that is the stated reason for her chronic pain and the reason she is on percocet. She does NOT have a pain contract. She was also diagnosed with a DVT 02/22/10 and blames that for some pain. Today she c/o swelling in her left LE, the one that had surgery, which is chronic.  Her daughter just got her an exercise bike and she wonders if it is safe to use with her DVT. I have assured her that it is.  She is currently being evaluated by pain clinic for her leg pain. She follows up with them 7/18 and there is a "nerve test" to be done or get the results of. She requested more or stronger pain meds today and I told her no. We discussed chronic narcotics at length. I told her we would need to see what the painclinic recommended. She mentioned the diagnosis fibromyalgia and if that is what they end up calling this I would not favor narcotics for treatment.  Preventive Screening-Counseling & Management  Alcohol-Tobacco     Alcohol drinks/day: 0     Smoking Status: current     Smoking Cessation Counseling: yes     Packs/Day: 1 pp 2 weeks     Year Started: 1/4 pack month  Current Medications (verified): 1)  Hydrochlorothiazide 25 Mg Tabs (Hydrochlorothiazide) .... Take 1 Tablet By Mouth Once A Day 2)  Lisinopril 40 Mg  Tabs (Lisinopril) .... Take 1 Tablet By Mouth Once A Day 3)  Aspirin 81 Mg Tabs (Aspirin) .... Take 1 Tablet By Mouth Once A Day 4)  Coreg 25 Mg Tabs (Carvedilol) .... Take 1 Pill By Mouth Two Times A Day. 5)  Lipitor 40 Mg Tabs (Atorvastatin Calcium) .... Take 1 Pill By Mouth Daily. 6)  Proair Hfa 108 (90 Base) Mcg/act Aers (Albuterol Sulfate) .... Take 1-2 Puffs  As Needed Every 4-6 Hours As Needed For Shortness of Breath 7)  Klonopin 1 Mg Tabs (Clonazepam) .... Take 1 Pill By Mouth Three Times A Day. 8)  Niacin Cr 1000 Mg Cr-Tabs (Niacin) .... Take 1 Pill By Mouth Daily. Take Aspirin 81 Mg 30 Minutes Before Taking Niacin. 9)  Neurontin 800 Mg Tabs (Gabapentin) .... Take 1 Pill By Mouth Three Times A Day. 10)  Norvasc 10 Mg Tabs (Amlodipine Besylate) .... Take 1 Pill By Mouth Daily. 11)  Promethazine Hcl 25 Mg Tabs (Promethazine Hcl) .... Take 1 Pill By Mouth Three Times A Day As Needed For Vomiting. 12)  Robitussin Dm 100-10 Mg/73ml Syrp (Dextromethorphan-Guaifenesin) .... Take 10 Cc By Mouth Three Times A Day As Needed For Cough. 13)  Alprazolam 0.5 Mg Tabs (Alprazolam) .... Take 1 Pill By Mouth Three Times A Day As Needed For Anxiety. 14)  Cymbalta 60 Mg Cpep (Duloxetine Hcl) .... One 2 Times A Day 15)  Coumadin 5 Mg Tabs (Warfarin Sodium) .... Take 1 Tablet By Mouth Once A Day 16)  Percocet 5-325 Mg Tabs (Oxycodone-Acetaminophen) .... Take 1 Pill By Mouth As Needed Up To Every 6 Hourly. 17)  Geodon 60 Mg Caps (Ziprasidone Hcl) .... One 2 Times A Day 18)  Mirtazapine 15 Mg Tabs (Mirtazapine) .... 1/2 At Night 19)  Zolpidem Tartrate 10 Mg Tabs (Zolpidem Tartrate) .Marland Kitchen.. 1 At Night As Needed  Allergies: 1)  ! * Darvocet 2)  ! * Strawberries 3)  ! * Cats 4)  ! * Gin  Past History:  Past Medical History: Reviewed history from 02/27/2009 and no changes required. Hypertension Peripheral vascular disease w/ intermittent claudication      -s/p L fem-pop bypass 11/21/08           graft occluded 12/28/08      -aortogram w/ bilat LE runoff           1.  bilat diffuse SFA occlusive dz           2.  Mod to severe above-knee popliteal dz.           3.  Bilat 3-vessel runoff w/ mild tibial occlusive dz. Tobacco abuse Irregular menses starting 09/08 Generalized anxiety Hyperlipidemia Knee pain with crepitus, b/l, worse in right knee, 03/10 SKin color  changes left upper inner thigh following vascular surgery, 1st documented 03/10  Social History: Reviewed history from 05/18/2009 and no changes required. 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 pleasant in NAD  Head:  normocephalic and atraumatic.   Eyes:  vision grossly intact.   Neck:  supple and full ROM.   Lungs:  normal respiratory effort, no intercostal retractions, and no accessory muscle use.   Heart:  normal rate and regular rhythm.   Abdomen:  soft, non-tender, and normal bowel sounds.   Extremities:  LLE-well healed scar medial calf, swollen leg up to knee, not red or hot, mostly not-pitting edema;  RLE-not nearly as swollen, non-tender, no erythema, calf-tenderness, no pitting edema. Neurologic:  alert & oriented X3.  non-focal Psych:  Oriented X3.  appropriate, gives accurate history, obviously has some medical background, has fairly good insight into illness, not overtly depressed but she is on a lot of psychotropic medications   Impression & Recommendations:  Problem # 1:  CHRONIC PAIN SYNDROME (ICD-338.4) GREATER THAN 60 MINUTES SPENT WITH PATIENT WITH >50% SPENT FACE TO FACE WITH COUNSELING DISCUSSING PATIENT'S COMPLICATED MEDICAL HISTORY, TREATMENT PLAN, ANSWERING QUESTIONS, ETC.  I am not clear that patient needs to be on long term opiates. However for today, while pain clinic is evaluating, I filled them. She did not have a pain contract so one was filled out and a UDS was obtained. Her meds were filled 2 days early today because they were lost and I told her I will not do that again.  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 As well I do not plan to continue filling 2 benzodiazepines and Ambien, especially when she is also taking mirtazepine for sleep. I will try to call [facility] and talk to someone to coordinate her care. The following medications were removed from the medication list:    Celexa 40 Mg Tabs (Citalopram hydrobromide) .Marland Kitchen... Take 1/4 tablet by mouth once a day for 4 days in morning, then stop    Hydroxyzine Pamoate 25 Mg Caps (Hydroxyzine pamoate) .Marland Kitchen... Take one tab by mouth every 6 hours as needed for anxiety Her updated medication list for this problem includes:    Klonopin 1 Mg Tabs (Clonazepam) .Marland Kitchen... Take 1 pill by mouth three times a day.    Alprazolam 0.5 Mg Tabs (Alprazolam) .Marland Kitchen... Take 1 pill by mouth three times a day as needed for anxiety.    Cymbalta 60 Mg Cpep (Duloxetine hcl) ..... One 2 times a day    Mirtazapine 15 Mg Tabs (Mirtazapine) .Marland Kitchen... 1/2 at night  Problem # 3:  DVT (ICD-453.40) Patient shou.d be able to come off of coumadin at the end of this month!  Problem # 4:  EDEMA LEG (ICD-782.3) I spoke with Ms. Tuite at length today about the importance of increased water intake, decreased soda, tea and salt intake, and keeping her legs, especially her left one up above her heart when she is not up and around.  Her updated medication list for this problem includes:    Hydrochlorothiazide 25 Mg Tabs (Hydrochlorothiazide) .Marland Kitchen... Take 1 tablet by mouth once a day  Problem # 5:  ADULT MALTREATMENT UNSPECIFIED NEC (ICD-995.80) I spoke with patient about this situation and she said DSS had called and ot was much improved.  Problem # 6:  HYPERTENSION (ICD-401.9) This is not well controlled today but I suspect it is more situational and I will follow. Her updated medication list for this problem includes:  Hydrochlorothiazide 25 Mg Tabs (Hydrochlorothiazide) .Marland Kitchen... Take 1 tablet by mouth once a day    Lisinopril 40 Mg Tabs (Lisinopril) .Marland Kitchen... Take 1 tablet by  mouth once a day    Coreg 25 Mg Tabs (Carvedilol) .Marland Kitchen... Take 1 pill by mouth two times a day.    Norvasc 10 Mg Tabs (Amlodipine besylate) .Marland Kitchen... Take 1 pill by mouth daily.  Problem # 7:  Preventive Health Care (ICD-V70.0) After spending over an hour on acute issues, I did not get to take care of these today, but I will be seeing her back in one month And will take care of them then. We did discuss and I encouraged exercise which will definitely help with her health and overall well-being.  Complete Medication List: 1)  Hydrochlorothiazide 25 Mg Tabs (Hydrochlorothiazide) .... Take 1 tablet by mouth once a day 2)  Lisinopril 40 Mg Tabs (Lisinopril) .... Take 1 tablet by mouth once a day 3)  Aspirin 81 Mg Tabs (Aspirin) .... Take 1 tablet by mouth once a day 4)  Coreg 25 Mg Tabs (Carvedilol) .... Take 1 pill by mouth two times a day. 5)  Lipitor 40 Mg Tabs (Atorvastatin calcium) .... Take 1 pill by mouth daily. 6)  Proair Hfa 108 (90 Base) Mcg/act Aers (Albuterol sulfate) .... Take 1-2 puffs as needed every 4-6 hours as needed for shortness of breath 7)  Klonopin 1 Mg Tabs (Clonazepam) .... Take 1 pill by mouth three times a day. 8)  Niacin Cr 1000 Mg Cr-tabs (Niacin) .... Take 1 pill by mouth daily. take aspirin 81 mg 30 minutes before taking niacin. 9)  Neurontin 800 Mg Tabs (Gabapentin) .... Take 1 pill by mouth three times a day. 10)  Norvasc 10 Mg Tabs (Amlodipine besylate) .... Take 1 pill by mouth daily. 11)  Promethazine Hcl 25 Mg Tabs (Promethazine hcl) .... Take 1 pill by mouth three times a day as needed for vomiting. 12)  Robitussin Dm 100-10 Mg/1ml Syrp (Dextromethorphan-guaifenesin) .... Take 10 cc by mouth three times a day as needed for cough. 13)  Alprazolam 0.5 Mg Tabs (Alprazolam) .... Take 1 pill by mouth three times a day as needed for anxiety. 14)  Cymbalta 60 Mg Cpep (Duloxetine hcl) .... One 2 times a day 15)  Coumadin 5 Mg Tabs (Warfarin sodium) .... Take 1 tablet by  mouth once a day 16)  Percocet 5-325 Mg Tabs (Oxycodone-acetaminophen) .... Take 1 pill by mouth as needed up to every 6 hourly. 17)  Geodon 60 Mg Caps (Ziprasidone hcl) .... One 2 times a day 18)  Mirtazapine 15 Mg Tabs (Mirtazapine) .... 1/2 at night 19)  Zolpidem Tartrate 10 Mg Tabs (Zolpidem tartrate) .Marland Kitchen.. 1 at night as needed  Other Orders: Capillary Blood Glucose/CBG (10272) T-Drug Screen-Urine, (single) ([id number])  Patient Instructions: 1)  Please schedule a follow-up appointment in 1 month. 2)  Drink lots of water!  3)  I encourage you with your exercise. Prescriptions: KLONOPIN 1 MG TABS (CLONAZEPAM) take 1 pill by mouth three times a day.  #90 x 3   Entered and Authorized by:   Alisha Shutter MD   Signed by:   Alisha Shutter MD on 07/05/2010   Method used:   Print then Give to Patient   RxID:   [id number] PERCOCET 5-325 MG TABS (OXYCODONE-ACETAMINOPHEN) take 1 pill by mouth as needed up to every 6 hourly.  #120 x 0   Entered and Authorized by:   Alisha Shutter MD  Signed by:   Alisha Shutter MD on 07/05/2010   Method used:   Print then Give to Patient   RxID:   [id number] ALPRAZOLAM 0.5 MG TABS (ALPRAZOLAM) take 1 pill by mouth three times a day as needed for anxiety.  #60 x 0   Entered and Authorized by:   Alisha Shutter MD   Signed by:   Alisha Shutter MD on 07/05/2010   Method used:   Print then Give to Patient   RxID:   [id number] COUMADIN 5 MG TABS (WARFARIN SODIUM) Take 1 tablet by mouth once a day  #31 x 6   Entered and Authorized by:   Alisha Shutter MD   Signed by:   Alisha Shutter MD on 07/05/2010   Method used:   Print then Give to Patient   RxID:   [id number] ROBITUSSIN DM 100-10 MG/5ML SYRP (DEXTROMETHORPHAN-GUAIFENESIN) take 10 cc by mouth three times a day as needed for cough.  #1 x 1   Entered and Authorized by:   Alisha Shutter MD   Signed by:   Alisha Shutter MD on 07/05/2010   Method used:   Print then  Give to Patient   RxID:   [id number] PROMETHAZINE HCL 25 MG TABS (PROMETHAZINE HCL) take 1 pill by mouth three times a day as needed for vomiting.  #60 x 2   Entered and Authorized by:   Alisha Shutter MD   Signed by:   Alisha Shutter MD on 07/05/2010   Method used:   Print then Give to Patient   RxID:   [id number] NORVASC 10 MG TABS (AMLODIPINE BESYLATE) take 1 pill by mouth daily.  #30 x 6   Entered and Authorized by:   Alisha Shutter MD   Signed by:   Alisha Shutter MD on 07/05/2010   Method used:   Print then Give to Patient   RxID:   [id number] NEURONTIN 800 MG TABS (GABAPENTIN) take 1 pill by mouth three times a day.  #90 x 6   Entered and Authorized by:   Alisha Shutter MD   Signed by:   Alisha Shutter MD on 07/05/2010   Method used:   Print then Give to Patient   RxID:   [id number] PROAIR HFA 108 (90 BASE) MCG/ACT AERS (ALBUTEROL SULFATE) Take 1-2 puffs as needed every 4-6 hours as needed for shortness of breath  #1 inhaler x 6   Entered and Authorized by:   Alisha Shutter MD   Signed by:   Alisha Shutter MD on 07/05/2010   Method used:   Print then Give to Patient   RxID:   [id number] LIPITOR 40 MG TABS (ATORVASTATIN CALCIUM) take 1 pill by mouth daily.  #30 x 6   Entered and Authorized by:   Alisha Shutter MD   Signed by:   Alisha Shutter MD on 07/05/2010   Method used:   Print then Give to Patient   RxID:   [id number] COREG 25 MG TABS (CARVEDILOL) take 1 pill by mouth two times a day.  #60 x 6   Entered and Authorized by:   Alisha Shutter MD   Signed by:   Alisha Shutter MD on 07/05/2010   Method used:   Print then Give to Patient   RxID:   [id number] LISINOPRIL 40 MG  TABS (LISINOPRIL) Take 1 tablet by mouth once a day  #30 x 6  Entered and Authorized by:   Alisha Shutter MD   Signed by:   Alisha Shutter MD on 07/05/2010   Method used:   Print then Give to Patient   RxID:    [id number] HYDROCHLOROTHIAZIDE 25 MG TABS (HYDROCHLOROTHIAZIDE) Take 1 tablet by mouth once a day  #30 x 6   Entered and Authorized by:   Alisha Shutter MD   Signed by:   Alisha Shutter MD on 07/05/2010   Method used:   Print then Give to Patient   RxID:   [id number] ZOLPIDEM TARTRATE 10 MG TABS (ZOLPIDEM TARTRATE) 1 at night as needed  #20 x 6   Entered and Authorized by:   Alisha Shutter MD   Signed by:   Alisha Shutter MD on 07/05/2010   Method used:   Print then Give to Patient   RxID:   [id number]  Process Orders Check Orders Results:     [facility]: ABN not required for this insurance Tests Sent for requisitioning (July 06, 2010 7:42 AM):     07/05/2010: [facility] -- T-Drug Screen-Urine, (single) [[id number]] (signed)    Prevention & Chronic Care Immunizations   Influenza vaccine: Fluvax 3+  (09/25/2009)   Influenza vaccine deferral: Deferred  (04/02/2010)    Tetanus booster: 01/12/2010: Tdap   Td booster deferral: Not indicated  (07/05/2010)    Pneumococcal vaccine: Not documented   Pneumococcal vaccine deferral: Not indicated  (07/05/2010)  Other Screening   Pap smear: Not documented   Pap smear action/deferral: GYN Referral  (04/02/2010)    Mammogram: Assessment: BIRADS 4. Suspicious.  IMPRESSION: 1.  Lobulated heterogeneous solid and cystic mass 10 o'clock right  breast measuring approximately  4.6 cm in greatest dimension is indeterminate.  Malignancy is not  excluded.  Ultrasound-guided core needle biopsy is recommended. 2.  Prominent lymph node in axillary tail of the right breast.   Particularly in light of the  findings in the 10 o'clock region of the right breast,  ultrasound-guided core needle biopsy of this lymph node is suggested at the time of biopsy. 3.  No evidence of malignancy in the left breast.   Recommend ultrasound-guided core needle biopsy of palpable right  breast mass and  prominent lymph  node in the axillary tail of the right breast.  (03/10/2008)   Mammogram action/deferral: Ordered  (01/12/2010)   Smoking status: current  (07/05/2010)   Smoking cessation counseling: yes  (07/05/2010)  Lipids   Total Cholesterol: 137  (12/01/2009)   Lipid panel action/deferral: Lipid Panel ordered   LDL: 83  (12/01/2009)   LDL Direct: Not documented   HDL: 33  (12/01/2009)   Triglycerides: 104  (12/01/2009)    SGOT (AST): 16  (04/02/2010)   BMP action: Ordered   SGPT (ALT): 17  (04/02/2010)   Alkaline phosphatase: 63  (04/02/2010)   Total bilirubin: 0.3  (04/02/2010)    Lipid flowsheet reviewed?: Yes   Progress toward LDL goal: Improved  Hypertension   Last Blood Pressure: 173 / 104  (07/05/2010)   Serum creatinine: 0.61  (04/02/2010)   BMP action: Ordered   Serum potassium 4.2  (04/02/2010)    Hypertension flowsheet reviewed?: Yes   Progress toward BP goal: Deteriorated   Hypertension comments: due to situational factors today, will follow  Self-Management Support :   Personal Goals (by the next clinic visit) :      Personal blood pressure goal: 130/80  (09/25/2009)  Personal LDL goal: 70  (09/25/2009)    Patient will work on the following items until the next clinic visit to reach self-care goals:     Medications and monitoring: take my medicines every day  (07/05/2010)     Eating: eat more vegetables, use fresh or frozen vegetables, eat fruit for snacks and desserts, limit or avoid alcohol  (06/01/2010)     Activity: take a 30 minute walk every day  (06/01/2010)     Other: drink tea and coffee all day - just eat what they bring me - did not bring any meds - leg swelling has stopped her walking  (04/02/2010)    Hypertension self-management support: Pre-printed educational material, Resources for patients handout, Written self-care plan  (07/05/2010)   Hypertension self-care plan printed.    Lipid self-management support: Pre-printed educational  material, Resources for patients handout, Written self-care plan  (07/05/2010)   Lipid self-care plan printed.      Resource handout printed.   Process Orders Check Orders Results:     [facility]: ABN not required for this insurance Tests Sent for requisitioning (July 06, 2010 7:42 AM):     07/05/2010: [facility] -- T-Drug Screen-Urine, (single) [[id number]] (signed)

## 2011-01-29 NOTE — Progress Notes (Signed)
Summary: refill/ hla  Phone Note Refill Request Message from:  Patient on June 20, 2010 3:40 PM  Refills Requested: Medication #1:  NORVASC 10 MG TABS take 1 pill by mouth daily.  Medication #2:  SEROQUEL 50 MG TABS take 3 tablets at bedtime  Medication #3:  ALPRAZOLAM 0.5 MG TABS take 1 pill by mouth three times a day as needed for anxiety.  Medication #4:  CYMBALTA 20 MG CPEP Take 1 tablet by mouth once a day for 4 days in morning Initial call taken by: Marin Roberts RN,  June 20, 2010 3:42 PM    Prescriptions: CYMBALTA 20 MG CPEP (DULOXETINE HCL) Take 1 tablet by mouth once a day for 4 days in morning, then Take 1 tablet by mouth two times a day  #63 x 3   Entered and Authorized by:   Zoila Shutter MD   Signed by:   Zoila Shutter MD on 06/20/2010   Method used:   Electronically to        [facility] (retail)       [address]       [place], [place]  [id number]       Ph: [phone]       Fax: [phone]   RxID:   [id number] NORVASC 10 MG TABS (AMLODIPINE BESYLATE) take 1 pill by mouth daily.  #30 x 3   Entered and Authorized by:   Zoila Shutter MD   Signed by:   Zoila Shutter MD on 06/20/2010   Method used:   Electronically to        [facility] (retail)       [address]       [place], [place]  [address]       Ph: [phone]       Fax: [phone]   RxID:   [id number]

## 2011-01-29 NOTE — Assessment & Plan Note (Signed)
Summary: COU/VS  Anticoagulant Therapy Managed by: Barbera Setters. Janie Morning  PharmD CACP PCP: Valetta Close MD [facility] Attending: Rogelia Boga MD, Lanora Manis Indication 1: Deep vein thrombus Indication 2: Encounter for therapeutic drug monitoring  V58.83 Start date: 02/22/2010 Duration: 6 months  Patient Assessment Reviewed by: Chancy Milroy PharmD  March 19, 2010 Medication review: verified warfarin dosage & schedule,verified previous prescription medications, verified doses & any changes, verified new medications, reviewed OTC medications, reviewed OTC health products-vitamins supplements etc Complications: none Dietary changes: none   Health status changes: none   Lifestyle changes: none   Recent/future hospitalizations: none   Recent/future procedures: none   Recent/future dental: none Patient Assessment Part 2:  Have you MISSED ANY DOSES or CHANGED TABLETS?  No missed Warfarin doses or changed tablets.  Have you had any BRUISING or BLEEDING ( nose or gum bleeds,blood in urine or stool)?  No reported bruising or bleeding in nose, gums, urine, stool.  Have you STARTED or STOPPED any MEDICATIONS, including OTC meds,herbals or supplements?  No other medications or herbal supplements were started or stopped.  Have you CHANGED your DIET, especially green vegetables,or ALCOHOL intake?  No changes in diet or alcohol intake.  Have you had any ILLNESSES or HOSPITALIZATIONS?  No reported illnesses or hospitalizations  Have you had any signs of CLOTTING?(chest discomfort,dizziness,shortness of breath,arms tingling,slurred speech,swelling or redness in leg)    No chest discomfort, dizziness, shortness of breath, tingling in arm, slurred speech, swelling, or redness in leg.     Treatment  Target INR: 2.0-3.0 INR: 2.0  Date: 03/19/2010 Regimen In:  42.5mg /week INR reflects regimen in: 2.0  New  Tablet strength: : 5mg  Regimen Out:     Sunday: 1 & 1/2 Tablet     Monday: 1 & 1/2 Tablet  Tuesday: 1 & 1/2 Tablet     Wednesday: 1 Tablet     Thursday: 1 & 1/2 Tablet      Friday: 1 & 1/2 Tablet     Saturday: 1 & 1/2 Tablet Total Weekly: 50mg/wk mg  Next INR Due: 04/02/2010 Adjusted by: Geran Haithcock B. Katreena Schupp III PharmD CACP   Return to anticoagulation clinic:  04/02/2010 Time of next visit: 1045    Allergies: 1)  ! * Darvocet 2)  ! * Strawberries 3)  ! * Cats 4)  ! * Gin Prescriptions: COUMADIN 5 MG TABS (WARFARIN SODIUM) Take 1 tablet by mouth once a day  #31 x 6   Entered by:   Jay Ashleah Valtierra PharmD   Authorized by:   Elizabeth Butcher MD   Signed by:   Jay Vyolet Sakuma PharmD on 03/19/2010   Method used:   Telephoned to ...       [facility] (retail)       [address] [place], [place]  [address]       Ph: [phone]       Fax: [phone]   RxID:   [id number]

## 2011-01-29 NOTE — Progress Notes (Signed)
Summary: Coumadin/gp  Phone Note Other Incoming   Summary of Call: Dr.Woodyear called about pt.'s Coumadin dose needs to be decreased to 2.5mg  until she sees Chancy Milroy Monday 3/7.  Pt.was called; no one home.  Message left for her to call me back at the clinic. Initial call taken by: Chinita Pester RN,  February 28, 2010 4:40 PM  Follow-up for Phone Call        Pt. instructed to take 2.5mg  Coumadin until seen by Chancy Milroy Monday per Dr. Coralee Pesa; pt. understanded. Follow-up by: Chinita Pester RN,  February 28, 2010 5:04 PM     Appended Document: Coumadin/gp I reviewed her coumadin regimen while in the hospital with Dr. Coralee Pesa and Dr. Alexandria Lodge, and we concluded that she can remain on 5mg  of coumadin daily. I tried calling the patient twice to tell her to go back to taking a whole tablet but couldn't get through. I will ask Glenda to try at some point today. (Thanks, Glenda!)  Appended Document: Coumadin/gp Pt. called several times; unable to get through - "pt. is out of service area or phone is off".  Dr. Tobie Lords awared.  I will try tomorrow.  Appended Document: Coumadin/gp Pt. was called again; stated her telephone was turned back on today.  Pt. instructed to take 5mg  of Coumadin instead of 2.5mg .  She said she will take the other half (2.5mg ) now.  Reminded pt. of appt. with Vonna Kotyk on Monday.

## 2011-01-29 NOTE — Progress Notes (Signed)
Summary: [facility] cancelled/ [facility]  Phone Note Call from Patient   Summary of Call: pt called left somewhat dramatic message that someone should cancel her [facility] appt or she would get "booted out" of [facility], i called and cancelled appt then called 3000 and informed the floor clerk to let pt know Initial call taken by: Marin Roberts RN,  February 23, 2010 9:56 AM

## 2011-01-29 NOTE — Miscellaneous (Signed)
Summary: Behavioral Health  Behavioral Health   Imported By: Florinda Marker 01/01/2010 14:35:59  _____________________________________________________________________  External Attachment:    Type:   Image     Comment:   External Document

## 2011-01-29 NOTE — Assessment & Plan Note (Signed)
Summary: EST-1 MONTH RECHECK/CH   Vital Signs:  Patient profile:   50 year old female Height:      64 inches Weight:      214.5 pounds BMI:     36.95 Temp:     97.1 degrees F oral Pulse rate:   79 / minute BP sitting:   145 / 97  (right arm)  Vitals Entered By: Filomena Jungling NT II (May 04, 2010 9:58 AM)  Serial Vital Signs/Assessments:  Time      Position  BP       Pulse  Resp  Temp     By 11:06 AM            130/90                         Mamie Hague NT II  CC: NEED REFILL, percoet hurting stomach, would like to go  back to vicodine,need something for breakthrough pain, er follow-up Is Patient Diabetic? No Nutritional Status BMI of > 30 = obese  Have you ever been in a relationship where you felt threatened, hurt or afraid?No   Does patient need assistance? Functional Status Self care Ambulation Normal   Primary Care Provider:  Valetta Close MD  CC:  NEED REFILL, percoet hurting stomach, would like to go  back to vicodine, need something for breakthrough pain, and er follow-up.  History of Present Illness: Ms. Monaco is a 50 yo lady with PMH as outlined in the EMR comes today for a f/u visit.   1. Leg pain and swelling: Her pain clinic appt is later this month. She has persistent pain in her legs. She says neurontin is probably helping. She wants to take vicodin and not percocet as she thinks that vicodin works better with neurontin.   2. DVT: She is taking her coumadin regularly.   3. Tobacco abuse: She smokes 4 cigg a day.   4. HTN: She now takes new dose of norvasc 10 mg daily.   5. Psychiatric problems: This is being followed by mental health dept.   Preventive Screening-Counseling & Management  Alcohol-Tobacco     Smoking Cessation Counseling: yes  Current Medications (verified): 1)  Hydrochlorothiazide 25 Mg Tabs (Hydrochlorothiazide) .... Take 1 Tablet By Mouth Once A Day 2)  Lisinopril 40 Mg  Tabs (Lisinopril) .... Take 1 Tablet By Mouth Once A Day 3)   Aspirin 81 Mg Tabs (Aspirin) .... Take 1 Tablet By Mouth Once A Day 4)  Coreg 25 Mg Tabs (Carvedilol) .... Take 1 Pill By Mouth Two Times A Day. 5)  Lipitor 40 Mg Tabs (Atorvastatin Calcium) .... Take 1 Pill By Mouth Daily. 6)  Proair Hfa 108 (90 Base) Mcg/act Aers (Albuterol Sulfate) .... Take 1-2 Puffs As Needed Every 4-6 Hours As Needed For Shortness of Breath 7)  Klonopin 1 Mg Tabs (Clonazepam) .... Take 1 Pill By Mouth Three Times A Day. 8)  Niacin Cr 1000 Mg Cr-Tabs (Niacin) .... Take 1 Pill By Mouth Daily. Take Aspirin 81 Mg 30 Minutes Before Taking Niacin. 9)  Neurontin 800 Mg Tabs (Gabapentin) .... Take 1 Pill By Mouth Three Times A Day. 10)  Norvasc 10 Mg Tabs (Amlodipine Besylate) .... Take 1 Pill By Mouth Daily. 11)  Seroquel 50 Mg Tabs (Quetiapine Fumarate) .... Take 3 Tablets At Bedtime 12)  Promethazine Hcl 25 Mg Tabs (Promethazine Hcl) .... Take 1 Pill By Mouth Three Times A Day As Needed For Vomiting.  13)  Robitussin Dm 100-10 Mg/32ml Syrp (Dextromethorphan-Guaifenesin) .... Take 10 Cc By Mouth Three Times A Day As Needed For Cough. 14)  Celexa 40 Mg Tabs (Citalopram Hydrobromide) .... Take 1/4 Tablet By Mouth Once A Day For 4 Days in Morning, Then Stop 15)  Hydroxyzine Pamoate 25 Mg Caps (Hydroxyzine Pamoate) .... Take One Tab By Mouth Every 6 Hours As Needed For Anxiety 16)  Alprazolam 0.5 Mg Tabs (Alprazolam) .... Take 1 Pill By Mouth Three Times A Day As Needed For Anxiety. 17)  Cymbalta 20 Mg Cpep (Duloxetine Hcl) .... Take 1 Tablet By Mouth Once A Day For 4 Days in Morning, Then Take 1 Tablet By Mouth Two Times A Day 18)  Lovenox 100 Mg/ml Soln (Enoxaparin Sodium) .Marland Kitchen.. 100 Mg Subcutaneous Injection Two Times A Day For 4 Days 19)  Coumadin 5 Mg Tabs (Warfarin Sodium) .... Take 1 Tablet By Mouth Once A Day 20)  Vicodin 5-500 Mg Tabs (Hydrocodone-Acetaminophen) .... Take 1-2 Pill By Mouth Every 6 Hourly As Needed For Pain  Allergies: 1)  ! * Darvocet 2)  ! * Strawberries 3)   ! * Cats 4)  ! * Gin  Review of Systems      See HPI  Physical Exam  Mouth:  pharynx pink and moist.   Lungs:  normal breath sounds, no crackles, and no wheezes.   Heart:  normal rate, regular rhythm, and no murmur.   Extremities:  1+ left pedal edema and 1+ right pedal edema.   Neurologic:  alert & oriented X3.     Impression & Recommendations:  Problem # 1:  DVT (ICD-453.40) Will continue coumadin and f/u with Dr. Alexandria Lodge.   Orders: [facility] (in-house) [phone])  Problem # 2:  DEPRESSION (ICD-311) This is being followed at mental health dept.  Her updated medication list for this problem includes:    Klonopin 1 Mg Tabs (Clonazepam) .Marland Kitchen... Take 1 pill by mouth three times a day.    Celexa 40 Mg Tabs (Citalopram hydrobromide) .Marland Kitchen... Take 1/4 tablet by mouth once a day for 4 days in morning, then stop    Hydroxyzine Pamoate 25 Mg Caps (Hydroxyzine pamoate) .Marland Kitchen... Take one tab by mouth every 6 hours as needed for anxiety    Alprazolam 0.5 Mg Tabs (Alprazolam) .Marland Kitchen... Take 1 pill by mouth three times a day as needed for anxiety.    Cymbalta 20 Mg Cpep (Duloxetine hcl) .Marland Kitchen... 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  Her updated medication list for this problem includes:    Klonopin 1 Mg Tabs (Clonazepam) .Marland Kitchen... Take 1 pill by mouth three times a day.    Celexa 40 Mg Tabs (Citalopram hydrobromide) .Marland Kitchen... Take 1/4 tablet by mouth once a day for 4 days in morning, then stop    Hydroxyzine Pamoate 25 Mg Caps (Hydroxyzine pamoate) .Marland Kitchen... Take one tab by mouth every 6 hours as needed for anxiety    Alprazolam 0.5 Mg Tabs (Alprazolam) .Marland Kitchen... Take 1 pill by mouth three times a day as needed for anxiety.    Cymbalta 20 Mg Cpep (Duloxetine hcl) .Marland Kitchen... Take 1 tablet by mouth once a day for 4 days in morning, then take 1 tablet by mouth two times  a day  Problem # 4:  TOBACCO ABUSE (ICD-305.1) strogly encouraged again today to quit  smoking, especially after having DVT and arterial bypass surgery.   Problem # 5:  PERIPHERAL VASCULAR DISEASE (ICD-443.9) s/p surgery and pain worse after surgery. She states neurontin has helped somewhat and vicodin works better than percocet with neurontin. I changed to vicodin. It is interesting to see what will be the opinion from pain clinic MD about her having RDS.  Problem # 6:  HYPERTENSION (ICD-401.9) Although initial BP was elevated, repeat BP was 130/80. WIll continue current regimen.  Her updated medication list for this problem includes:    Hydrochlorothiazide 25 Mg Tabs (Hydrochlorothiazide) .Marland Kitchen... Take 1 tablet by mouth once a day    Lisinopril 40 Mg Tabs (Lisinopril) .Marland Kitchen... Take 1 tablet by mouth once a day    Coreg 25 Mg Tabs (Carvedilol) .Marland Kitchen... Take 1 pill by mouth two times a day.    Norvasc 10 Mg Tabs (Amlodipine besylate) .Marland Kitchen... Take 1 pill by mouth daily.  BP today: 145/97 Prior BP: 153/101 (04/02/2010)  Labs Reviewed: K+: 4.2 (04/02/2010) Creat: : 0.61 (04/02/2010)   Chol: 137 (12/01/2009)   HDL: 33 (12/01/2009)   LDL: 83 (12/01/2009)   TG: 104 (12/01/2009)  Complete Medication List: 1)  Hydrochlorothiazide 25 Mg Tabs (Hydrochlorothiazide) .... Take 1 tablet by mouth once a day 2)  Lisinopril 40 Mg Tabs (Lisinopril) .... Take 1 tablet by mouth once a day 3)  Aspirin 81 Mg Tabs (Aspirin) .... Take 1 tablet by mouth once a day 4)  Coreg 25 Mg Tabs (Carvedilol) .... Take 1 pill by mouth two times a day. 5)  Lipitor 40 Mg Tabs (Atorvastatin calcium) .... Take 1 pill by mouth daily. 6)  Proair Hfa 108 (90 Base) Mcg/act Aers (Albuterol sulfate) .... Take 1-2 puffs as needed every 4-6 hours as needed for shortness of breath 7)  Klonopin 1 Mg Tabs (Clonazepam) .... Take 1 pill by mouth three times a day. 8)  Niacin Cr 1000 Mg Cr-tabs (Niacin) .... Take 1 pill by mouth daily. take aspirin 81  mg 30 minutes before taking niacin. 9)  Neurontin 800 Mg Tabs (Gabapentin) .... Take 1 pill by mouth three times a day. 10)  Norvasc 10 Mg Tabs (Amlodipine besylate) .... Take 1 pill by mouth daily. 11)  Seroquel 50 Mg Tabs (Quetiapine fumarate) .... Take 3 tablets at bedtime 12)  Promethazine Hcl 25 Mg Tabs (Promethazine hcl) .... Take 1 pill by mouth three times a day as needed for vomiting. 13)  Robitussin Dm 100-10 Mg/69ml Syrp (Dextromethorphan-guaifenesin) .... Take 10 cc by mouth three times a day as needed for cough. 14)  Celexa 40 Mg Tabs (Citalopram hydrobromide) .... Take 1/4 tablet by mouth once a day for 4 days in morning, then stop 15)  Hydroxyzine Pamoate 25 Mg Caps (Hydroxyzine pamoate) .... Take one tab by mouth every 6 hours as needed for anxiety 16)  Alprazolam 0.5 Mg Tabs (Alprazolam) .... Take 1 pill by mouth three times a day as needed for anxiety. 17)  Cymbalta 20 Mg Cpep (Duloxetine hcl) .... Take 1 tablet by mouth once a day for 4 days in morning, then take 1 tablet by mouth two times a day 18)  Lovenox 100 Mg/ml Soln (Enoxaparin sodium) .Marland Kitchen.. 100 mg subcutaneous injection two times a day for 4 days 19)  Coumadin 5 Mg Tabs (Warfarin sodium) .... Take 1 tablet by mouth once a day 20)  Vicodin 5-500 Mg Tabs (Hydrocodone-acetaminophen) .... Take 1-2 pill by mouth every 6 hourly as needed for pain  Patient Instructions: 1)  Please schedule a follow-up appointment in 1 month. 2)  Limit your Sodium (Salt) to less than 2 grams a day(slightly less than 1/2 a teaspoon) to prevent fluid retention, swelling, or worsening of symptoms. 3)  Tobacco is very bad for your health and your loved ones! You Should stop smoking!. 4)  Stop Smoking Tips: Choose a Quit date. Cut down before the Quit date. decide what you will do as a substitute when you feel the urge to smoke(gum,toothpick,exercise). 5)  It is important that you exercise regularly at least 20 minutes 5 times a week. If you develop  chest pain, have severe difficulty breathing, or feel very tired , stop exercising immediately and seek medical attention. Prescriptions: VICODIN 5-500 MG TABS (HYDROCODONE-ACETAMINOPHEN) take 1-2 pill by mouth every 6 hourly as needed for pain  #150 x 0   Entered and Authorized by:   Jason Coop MD   Signed by:   Jason Coop MD on 05/04/2010   Method used:   Print then Give to Patient   RxID:   [id number]   Prevention & Chronic Care Immunizations   Influenza vaccine: Fluvax 3+  (09/25/2009)   Influenza vaccine deferral: Deferred  (04/02/2010)    Tetanus booster: 01/12/2010: Tdap    Pneumococcal vaccine: Not documented   Pneumococcal vaccine deferral: Deferred  (04/02/2010)  Other Screening   Pap smear: Not documented   Pap smear action/deferral: GYN Referral  (04/02/2010)    Mammogram: Assessment: BIRADS 4. Suspicious.  IMPRESSION: 1.  Lobulated heterogeneous solid and cystic mass 10 o'clock right  breast measuring approximately  4.6 cm in greatest dimension is indeterminate.  Malignancy is not  excluded.  Ultrasound-guided core needle biopsy is recommended. 2.  Prominent lymph node in axillary tail of the right breast.   Particularly in light of the  findings in the 10 o'clock region of the right breast,  ultrasound-guided core needle biopsy of this lymph node is suggested at the time of biopsy. 3.  No evidence of malignancy in the left breast.   Recommend ultrasound-guided core needle biopsy of palpable right  breast mass and prominent lymph  node in the axillary tail of the right breast.  (03/10/2008)   Mammogram action/deferral: Ordered  (01/12/2010)   Smoking status: current  (04/02/2010)   Smoking cessation counseling: yes  (05/04/2010)  Lipids   Total Cholesterol: 137  (12/01/2009)   Lipid panel action/deferral: Lipid Panel ordered   LDL: 83  (12/01/2009)   LDL Direct: Not documented   HDL: 33  (12/01/2009)   Triglycerides: 104   (12/01/2009)    SGOT (AST): 16  (04/02/2010)   BMP action: Ordered   SGPT (ALT): 17  (04/02/2010)   Alkaline phosphatase: 63  (04/02/2010)   Total bilirubin: 0.3  (04/02/2010)    Lipid flowsheet reviewed?: Yes   Progress toward LDL goal: Unchanged  Hypertension   Last Blood Pressure: 145 / 97  (05/04/2010)   Serum creatinine: 0.61  (04/02/2010)   BMP action: Ordered   Serum potassium 4.2  (04/02/2010)    Hypertension flowsheet reviewed?: Yes   Progress toward BP goal: Improved  Self-Management Support :   Personal Goals (by the next clinic visit) :      Personal blood pressure goal: 130/80  (09/25/2009)     Personal LDL goal: 70  (09/25/2009)    Hypertension self-management support: Written self-care plan  (05/04/2010)   Hypertension self-care plan printed.    Lipid self-management support: Written self-care plan  (05/04/2010)   Lipid self-care  plan printed.     Laboratory Results   Blood Tests   Date/Time Received: May 04, 2010 11:16 AM Date/Time Reported: Alric Quan  May 04, 2010 11:16 AM    INR: 1.8   (Normal Range: 0.88-1.12   Therap INR: 2.0-3.5)

## 2011-01-29 NOTE — Medication Information (Signed)
Summary: PERCOCET  PERCOCET   Imported By: Margie Billet 06/14/2010 10:39:27  _____________________________________________________________________  External Attachment:    Type:   Image     Comment:   External Document

## 2011-01-29 NOTE — Assessment & Plan Note (Signed)
Summary: HFU-PER DR YANG/CFB   Vital Signs:  Patient profile:   50 year old female Height:      64.5 inches (163.83 cm) Weight:      206.1 pounds (93.68 kg) BMI:     34.96 Temp:     98.7 degrees F oral Pulse rate:   79 / minute BP sitting:   144 / 93  (right arm)  Vitals Entered By: Chinita Pester RN (February 27, 2010 1:37 PM) CC: Hospital f/u. Left leg pain. Difficulty sleeping. Large abd. bruise., Depression Pain Assessment Patient in pain? yes     Location: left leg Intensity: 7 Type: aching Onset of pain  Intermittent; took pain pill prior to appt. Nutritional Status BMI of > 30 = obese  Have you ever been in a relationship where you felt threatened, hurt or afraid?Unable to ask; someone w/pt.   Does patient need assistance? Functional Status Self care Ambulation Impaired:Risk for fall Comments uses a cane   Primary Care Provider:  Valetta Close MD  CC:  Hospital f/u. Left leg pain. Difficulty sleeping. Large abd. bruise. and Depression.  History of Present Illness: Alisha [name] is a 50 yo F with h/o severe depression who returns for hospital follow-up after diagnosis and treatment of DVT x 3. She continues to have significant pain in her legs, though her pain is bilateral and mostly in her L leg (which is where she had a fem-pop bypass in 2008) rather than her R leg, which is where she had 3 DVTs. She was discharged on MS Contin but says this hasn't been helping quite as much as she hoped, though she feels the gapapentin actually helps the most. She also reports restless legs that are so disruptive that she cannot rest and her husband has to sleep in another room, as well as "pus pockets" in her eye. In the hospital, she reported thoughts of hurting herself and/or others. She denies SI or HI today, but says "sometimes I wish God would just take me." She is followed by [facility].   Depression History:      The patient is having a depressed mood most of the day  and has a diminished interest in her usual daily activities.        The patient denies that she feels like life is not worth living, denies that she wishes that she were dead, and denies that she has thought about ending her life.        Comments:  On Cymbalta.  "I'm cry all the time.".   Preventive Screening-Counseling & Management  Alcohol-Tobacco     Alcohol drinks/day: 0     Smoking Status: current     Smoking Cessation Counseling: yes     Packs/Day: 4 cigarettes/day  Caffeine-Diet-Exercise     Does Patient Exercise: yes  Current Medications (verified): 1)  Hydrochlorothiazide 25 Mg Tabs (Hydrochlorothiazide) .... Take 1 Tablet By Mouth Once A Day 2)  Lisinopril 40 Mg  Tabs (Lisinopril) .... Take 1 Tablet By Mouth Once A Day 3)  Aspirin 81 Mg Tabs (Aspirin) .... Take 1 Tablet By Mouth Once A Day 4)  Coreg 25 Mg Tabs (Carvedilol) .... Take 1 Pill By Mouth Two Times A Day. 5)  Lipitor 40 Mg Tabs (Atorvastatin Calcium) .... Take 1 Pill By Mouth Daily. 6)  Proair Hfa 108 (90 Base) Mcg/act Aers (Albuterol Sulfate) .... Take 1-2 Puffs As Needed Every 4-6 Hours As Needed For Shortness of Breath 7)  Klonopin  1 Mg Tabs (Clonazepam) .... Take 1 Pill By Mouth Three Times A Day. 8)  Niacin Cr 1000 Mg Cr-Tabs (Niacin) .... Take 1 Pill By Mouth Daily. Take Aspirin 81 Mg 30 Minutes Before Taking Niacin. 9)  Neurontin 600 Mg Tabs (Gabapentin) .... Take 1 Pill By Mouth Three Times A Day. 10)  Norvasc 5 Mg Tabs (Amlodipine Besylate) .... Take 1 Pill By Mouth Daily. 11)  Seroquel 50 Mg Tabs (Quetiapine Fumarate) .... Take 3 Tablets At Bedtime 12)  Promethazine Hcl 25 Mg Tabs (Promethazine Hcl) .... Take 1 Pill By Mouth Three Times A Day As Needed For Vomiting. 13)  Robitussin Dm 100-10 Mg/85ml Syrp (Dextromethorphan-Guaifenesin) .... Take 10 Cc By Mouth Three Times A Day As Needed For Cough. 14)  Celexa 40 Mg Tabs (Citalopram Hydrobromide) .... Take 1/4 Tablet By Mouth Once A Day For 4 Days in Morning,  Then Stop 15)  Hydroxyzine Pamoate 25 Mg Caps (Hydroxyzine Pamoate) .... Take One Tab By Mouth Every 6 Hours As Needed For Anxiety 16)  Alprazolam 0.5 Mg Tabs (Alprazolam) .... Take 1 Pill By Mouth Three Times A Day As Needed For Anxiety. 17)  Cymbalta 20 Mg Cpep (Duloxetine Hcl) .... Take 1 Tablet By Mouth Once A Day For 4 Days in Morning, Then Take 1 Tablet By Mouth Two Times A Day 18)  Lovenox 100 Mg/ml Soln (Enoxaparin Sodium) .Marland Kitchen.. 100 Mg Subcutaneous Injection Two Times A Day For 4 Days 19)  Coumadin 5 Mg Tabs (Warfarin Sodium) .... Take 1 Tablet By Mouth Once A Day 20)  Percocet 10-325 Mg Tabs (Oxycodone-Acetaminophen) .Marland Kitchen.. 1 Tab Every 6 Hours As Needed For Pain  Allergies (verified): 1)  ! * Darvocet 2)  ! * Strawberries 3)  ! * Cats 4)  ! * Gin  Past History:  Past Medical History: Last updated: 02/27/2009 Hypertension Peripheral vascular disease w/ intermittent claudication      -s/p L fem-pop bypass 11/21/08           graft occluded 12/28/08      -aortogram w/ bilat LE runoff           1.  bilat diffuse SFA occlusive dz           2.  Mod to severe above-knee popliteal dz.           3.  Bilat 3-vessel runoff w/ mild tibial occlusive dz. 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 No apparent alopecia or balding. Eyes:  proptosis evident, small swellings located behind eyes Lungs:  Normal respiratory effort, chest expands symmetrically. Lungs are clear to auscultation, no crackles or wheezes. Heart:  Normal rate and regular rhythm. S1 and S2 normal without gallop, murmur, click, rub or other extra sounds. Abdomen:  large bruise with  nodule on abdomen where Lovenox was injected at hospital (hematoma) Msk:  LLE and RLE tender most predominantly at shin/calf area (L>>R)), no erythema or redness Extremities:  1+ left pedal edema and trace right pedal edema.   Neurologic:  alert & oriented X3.   Psych:  Oriented X3. Pt i very tearful, depressed-appearing, upset about the pain in her lower legs   Impression & Recommendations:  Problem # 1:  RESTLESS LEG SYNDROME (ICD-333.94) In addition to leg pain, restless leg syndrome is best treated with opiates and gabapentin, both which she is currently taking. In terms of her opiates, she will restart her perococet 10/325 q6 hrs as needed and d/c her MS Contin. We will increase the dose of her gabapentin to 900 mg three times a day. I will see her back in 2-3 weeks for follow-up.  Problem # 2:  DVT (ICD-453.40) INR is therapeutic today. I have set her up an appointment to be followed by Dr. Alexandria Lodge in the coumadin clinic.   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 Aleene Davidson. She is being followed at [facility], who she said has increased her seroquel to 150 mg and are considering doing Haldol shots in the future. She will be starting Cymbalta today, which I assured her is a great  medication that has the potential to not only improve her depression but also her leg pains. She said that if she had any thoughts of hurting herself that she would call or go to [facility].  Her updated medication list for this problem includes:    Klonopin 1 Mg Tabs (Clonazepam) .Marland Kitchen... Take 1 pill by mouth three times a day.    Celexa 40 Mg Tabs (Citalopram hydrobromide) .Marland Kitchen... Take 1/4 tablet by mouth once a day for 4 days in morning, then stop    Hydroxyzine Pamoate 25 Mg Caps (Hydroxyzine pamoate) .Marland Kitchen... Take one tab by mouth every 6 hours as needed for anxiety    Alprazolam 0.5 Mg Tabs (Alprazolam) .Marland Kitchen... Take 1 pill by mouth three times a day as needed for anxiety.    Cymbalta 20 Mg Cpep (Duloxetine hcl) .Marland Kitchen... Take 1 tablet by mouth once a day for 4 days in morning, then take 1 tablet by mouth two times a day  Problem # 4:  HORDEOLUM (ICD-373.11) Patient's complaints of "pus pockets" behind eyes are consistent with hordeolum. Dr. Coralee Pesa advised the patient to try warm wash cloths and reassured her that they are not of any concern.  Complete Medication List: 1)  Hydrochlorothiazide 25 Mg Tabs (Hydrochlorothiazide) .... Take 1 tablet by mouth once a day 2)  Lisinopril 40 Mg Tabs (Lisinopril) .... Take 1 tablet by mouth once a day 3)  Aspirin 81 Mg Tabs (Aspirin) .... Take 1 tablet by mouth once a day 4)  Coreg 25 Mg Tabs (Carvedilol) .... Take 1 pill by mouth two times a day. 5)  Lipitor 40 Mg Tabs (Atorvastatin calcium) .... Take 1 pill by mouth daily. 6)  Proair Hfa 108 (90 Base) Mcg/act Aers (Albuterol sulfate) .... Take 1-2 puffs as needed every 4-6 hours as needed for shortness of breath 7)  Klonopin 1 Mg Tabs (Clonazepam) .... Take 1 pill by mouth three times a day. 8)  Niacin Cr 1000 Mg Cr-tabs (Niacin) .... Take 1 pill by mouth daily. take aspirin 81 mg 30 minutes before taking niacin.  9)  Neurontin 600 Mg Tabs (Gabapentin) .... Take 1 pill by mouth three times a day. 10)   Norvasc 5 Mg Tabs (Amlodipine besylate) .... Take 1 pill by mouth daily. 11)  Seroquel 50 Mg Tabs (Quetiapine fumarate) .... Take 3 tablets at bedtime 12)  Promethazine Hcl 25 Mg Tabs (Promethazine hcl) .... Take 1 pill by mouth three times a day as needed for vomiting. 13)  Robitussin Dm 100-10 Mg/25ml Syrp (Dextromethorphan-guaifenesin) .... Take 10 cc by mouth three times a day as needed for cough. 14)  Celexa 40 Mg Tabs (Citalopram hydrobromide) .... Take 1/4 tablet by mouth once a day for 4 days in morning, then stop 15)  Hydroxyzine Pamoate 25 Mg Caps (Hydroxyzine pamoate) .... Take one tab by mouth every 6 hours as needed for anxiety 16)  Alprazolam 0.5 Mg Tabs (Alprazolam) .... Take 1 pill by mouth three times a day as needed for anxiety. 17)  Cymbalta 20 Mg Cpep (Duloxetine hcl) .... Take 1 tablet by mouth once a day for 4 days in morning, then take 1 tablet by mouth two times a day 18)  Lovenox 100 Mg/ml Soln (Enoxaparin sodium) .Marland Kitchen.. 100 mg subcutaneous injection two times a day for 4 days 19)  Coumadin 5 Mg Tabs (Warfarin sodium) .... Take 1 tablet by mouth once a day 20)  Percocet 10-325 Mg Tabs (Oxycodone-acetaminophen) .Marland Kitchen.. 1 tab every 6 hours as needed for pain  Other Orders: T-TSH ([id number])  Patient Instructions: 1)  Please schedule a follow-up appointment in 2 weeks with Dr. Aleene Davidson. If no appointment is available, please schedule it with Dr. Tobie Lords.  2)  Please also schedule Alisha [name] to see Dr. Alexandria Lodge in coumadin clinic on Monday, March 7. 3)  We have increased your Neurontin. Please note the new dose. 4)  Please stop taking your MS-Contin once you start taking your percocet again. 5)  Call behavioral health or the clinic if you have any thoughts of hurting yourself or anyone else. Process Orders Check Orders Results:     [facility]: ABN not required for this insurance Tests Sent for requisitioning (February 28, 2010 12:03 PM):     02/27/2010: [facility] -- T-Protime, Auto [[id number]] (signed)     02/27/2010: [facility] -- T-TSH [phone] (signed)    Prevention & Chronic Care Immunizations   Influenza vaccine: Fluvax 3+  (09/25/2009)    Tetanus booster: 01/12/2010: Tdap    Pneumococcal vaccine: Not documented  Other Screening   Pap smear: Not documented   Pap smear action/deferral: Deferred  (08/21/2009)    Mammogram: Assessment: BIRADS 4. Suspicious.  IMPRESSION: 1.  Lobulated heterogeneous solid and cystic mass 10 o'clock right  breast measuring approximately  4.6 cm in greatest dimension is indeterminate.  Malignancy is not  excluded.  Ultrasound-guided core needle biopsy is recommended. 2.  Prominent lymph node in axillary tail of the right breast.   Particularly in light of the  findings in the 10 o'clock region of the right breast,  ultrasound-guided core needle biopsy of this lymph node is suggested at the time of biopsy. 3.  No evidence of malignancy in the left breast.   Recommend ultrasound-guided core needle biopsy of palpable right  breast mass and prominent lymph  node in the axillary tail of the right breast.  (03/10/2008)   Mammogram action/deferral: Ordered  (01/12/2010)   Smoking status: current  (02/27/2010)   Smoking cessation counseling: yes  (02/27/2010)  Lipids   Total Cholesterol:  137  (12/01/2009)   Lipid panel action/deferral: Lipid Panel ordered   LDL: 83  (12/01/2009)   LDL Direct: Not documented   HDL: 33  (12/01/2009)   Triglycerides: 104  (12/01/2009)    SGOT (AST): 13  (12/01/2009)   BMP action: Ordered   SGPT (ALT): 14  (12/01/2009)   Alkaline phosphatase: 56  (12/01/2009)   Total bilirubin: 0.2  (12/01/2009)  Hypertension   Last Blood Pressure: 144 / 93  (02/27/2010)   Serum creatinine: 0.71  (12/01/2009)   BMP action: Ordered   Serum potassium 4.7  (12/01/2009)  Self-Management Support :   Personal Goals (by the next clinic visit)  :      Personal blood pressure goal: 130/80  (09/25/2009)     Personal LDL goal: 70  (09/25/2009)    Patient will work on the following items until the next clinic visit to reach self-care goals:     Medications and monitoring: take my medicines every day, check my blood pressure  (02/27/2010)     Eating: drink diet soda or water instead of juice or soda, eat more vegetables, eat foods that are low in salt, eat baked foods instead of fried foods  (02/27/2010)     Activity: take a 30 minute walk every day  (12/27/2009)    Hypertension self-management support: Education handout, Resources for patients handout, Written self-care plan  (02/27/2010)   Hypertension self-care plan printed.   Hypertension education handout printed    Lipid self-management support: Education handout, Resources for patients handout, Written self-care plan  (02/27/2010)   Lipid self-care plan printed.   Lipid education handout printed      Resource handout printed.

## 2011-01-29 NOTE — Progress Notes (Signed)
Summary: refill, lost all meds/ hla  Phone Note Call from Patient   Summary of Call: pt states she left her handbag with ALL her meds on the city bus and now needs all her meds refilled and sent to [facility], this is including her NARCOTICS, the refills will follow. Initial call taken by: Marin Roberts RN,  June 20, 2010 3:33 PM  Follow-up for Phone Call        I will not reill the narcotics but will refill the other medications. I also did not fill Klonipin or aprazolam as they are also addictive medicines that can be abused. I did not fill the seroquel as that has not been filled by [name]. Follow-up by: Zoila Shutter MD,  June 20, 2010 5:14 PM  Additional Follow-up for Phone Call Additional follow up Details #1::        pt informed Additional Follow-up by: Marin Roberts RN,  June 21, 2010 6:27 PM

## 2011-01-29 NOTE — Assessment & Plan Note (Signed)
Summary: Coumadin Clinic  Anticoagulant Therapy Managed by: Barbera Setters. Janie Morning  PharmD CACP PCP: Valetta Close MD [facility] Attending: Coralee Pesa MD, Levada Schilling Indication 1: Deep vein thrombus Indication 2: Encounter for therapeutic drug monitoring  V58.83 Start date: 02/22/2010 Duration: 6 months  Patient Assessment Reviewed by: Chancy Milroy PharmD  May 04, 2010 Medication review: verified warfarin dosage & schedule,verified previous prescription medications, verified doses & any changes, verified new medications, reviewed OTC medications, reviewed OTC health products-vitamins supplements etc Complications: none Dietary changes: none   Health status changes: none   Lifestyle changes: none   Recent/future hospitalizations: none   Recent/future procedures: none   Recent/future dental: none Patient Assessment Part 2:  Have you MISSED ANY DOSES or CHANGED TABLETS?  No missed Warfarin doses or changed tablets.  Have you had any BRUISING or BLEEDING ( nose or gum bleeds,blood in urine or stool)?  No reported bruising or bleeding in nose, gums, urine, stool.  Have you STARTED or STOPPED any MEDICATIONS, including OTC meds,herbals or supplements?  No other medications or herbal supplements were started or stopped.  Have you CHANGED your DIET, especially green vegetables,or ALCOHOL intake?  No changes in diet or alcohol intake.  Have you had any ILLNESSES or HOSPITALIZATIONS?  No reported illnesses or hospitalizations  Have you had any signs of CLOTTING?(chest discomfort,dizziness,shortness of breath,arms tingling,slurred speech,swelling or redness in leg)    No chest discomfort, dizziness, shortness of breath, tingling in arm, slurred speech, swelling, or redness in leg.     Treatment  Target INR: 2.0-3.0 INR: 1.8  Date: 05/04/2010 Regimen In:  45.0mg /week INR reflects regimen in: 1.8  New  Tablet strength: : 5mg  Regimen Out:     Sunday: 1 & 1/2 Tablet     Monday: 1 & 1/2 Tablet    Tuesday: 1 & 1/2 Tablet     Wednesday: 1 Tablet     Thursday: 1 & 1/2 Tablet      Friday: 1 & 1/2 Tablet     Saturday: 1 & 1/2 Tablet Total Weekly: 50mg /wk mg  Next INR Due: 05/21/2010 Adjusted by: Barbera Setters. Alexandria Lodge III PharmD CACP   Return to anticoagulation clinic:  05/21/2010 Time of next visit: 1015    Allergies: 1)  ! * Darvocet 2)  ! * Strawberries 3)  ! * Cats 4)  ! * Gin

## 2011-01-29 NOTE — Assessment & Plan Note (Signed)
Summary: COU/VS  Anticoagulant Therapy Managed by: Alisha [name]. Alisha [name]  PharmD CACP PCP: Valetta Close MD [facility] Attending: Sampson Goon MD, Onalee Hua Indication 1: Deep vein thrombus Indication 2: Encounter for therapeutic drug monitoring  V58.83 Start date: 02/22/2010 Duration: 6 months  Patient Assessment Reviewed by: Chancy Milroy PharmD  March 12, 2010 Medication review: verified warfarin dosage & schedule,verified previous prescription medications, verified doses & any changes, verified new medications, reviewed OTC medications, reviewed OTC health products-vitamins supplements etc Complications: none Dietary changes: none   Health status changes: none   Lifestyle changes: none   Recent/future hospitalizations: none   Recent/future procedures: none   Recent/future dental: none Patient Assessment Part 2:  Have you MISSED ANY DOSES or CHANGED TABLETS?  No missed Warfarin doses or changed tablets.  Have you had any BRUISING or BLEEDING ( nose or gum bleeds,blood in urine or stool)?  YES. Endorses epistaxis after falling in tub strking her head (left side) for which a radiological exam is going to be performed today per Dr. Gwendel Hanson.  Have you STARTED or STOPPED any MEDICATIONS, including OTC meds,herbals or supplements?  No other medications or herbal supplements were started or stopped.  Have you CHANGED your DIET, especially green vegetables,or ALCOHOL intake?  No changes in diet or alcohol intake.  Have you had any ILLNESSES or HOSPITALIZATIONS?  No reported illnesses or hospitalizations  Have you had any signs of CLOTTING?(chest discomfort,dizziness,shortness of breath,arms tingling,slurred speech,swelling or redness in leg)    No chest discomfort, dizziness, shortness of breath, tingling in arm, slurred speech, swelling, or redness in leg.     Treatment  Target INR: 2.0-3.0 INR: 1.8  Date: 03/12/2010 Regimen In:  27.5mg /week INR reflects regimen in: 1.8  New  Tablet  strength: : 5mg  Regimen Out:     Sunday: 1 Tablet     Monday: 1 & 1/2 Tablet     Tuesday: 1 Tablet     Wednesday: 1 & 1/2 Tablet     Thursday: 1 Tablet      Friday: 1 & 1/2 Tablet     Saturday: 1 Tablet Total Weekly: 42.5mg /week mg  Next INR Due: 03/19/2010 Adjusted by: Alisha [name]. Alisha [name] PharmD CACP   Return to anticoagulation clinic:  03/19/2010 Time of next visit: 1130   Comments: These NEW instructions are PENDING--awaiting confirmation of radiological exam due to head trauma sustained Saturday 12-Mar-11she states due to having fallen in the tub at home.  Allergies: 1)  ! * Darvocet 2)  ! * Strawberries 3)  ! * Cats 4)  ! * Gin

## 2011-01-29 NOTE — Medication Information (Signed)
Summary: PERCOCET   PERCOCET   Imported By: Margie Billet 10/11/2010 14:10:41  _____________________________________________________________________  External Attachment:    Type:   Image     Comment:   External Document

## 2011-01-29 NOTE — Miscellaneous (Signed)
Summary: Hospital discharge  Hospital Discharge  Date of admission: 02/22/2010  Date of discharge:02/24/2010  Brief reason for admission/active problems:  Patient was admitted to the hospital for right leg DVT. She was give therapeutic lovenox and coumadin after hospitalization and also geo psych consult. she has no SI/HI and will be discharged to home.   Followup needed: She will need four more days of lovenox treatment and coumadin 5 mg and needs to check INR next tuesday and outpatient clinic follow up for her symptoms.  The medication and problem lists have been updated.  Please see the dictated discharge summary for details.    Updated Prior Medication List: HYDROCHLOROTHIAZIDE 25 MG TABS (HYDROCHLOROTHIAZIDE) Take 1 tablet by mouth once a day LISINOPRIL 40 MG  TABS (LISINOPRIL) Take 1 tablet by mouth once a day ASPIRIN 81 MG TABS (ASPIRIN) Take 1 tablet by mouth once a day COREG 25 MG TABS (CARVEDILOL) take 1 pill by mouth two times a day. LIPITOR 40 MG TABS (ATORVASTATIN CALCIUM) take 1 pill by mouth daily. PROAIR HFA 108 (90 BASE) MCG/ACT AERS (ALBUTEROL SULFATE) Take 1-2 puffs as needed every 4-6 hours as needed for shortness of breath KLONOPIN 1 MG TABS (CLONAZEPAM) take 1 pill by mouth three times a day. NIACIN CR 1000 MG CR-TABS (NIACIN) take 1 pill by mouth daily. Take aspirin 81 mg 30 minutes before taking niacin. NEURONTIN 600 MG TABS (GABAPENTIN) take 1 pill by mouth three times a day. NORVASC 5 MG TABS (AMLODIPINE BESYLATE) take 1 pill by mouth daily. SEROQUEL 100 MG TABS (QUETIAPINE FUMARATE) take 1 pill at bedtime, per mental health. PROMETHAZINE HCL 25 MG TABS (PROMETHAZINE HCL) take 1 pill by mouth three times a day as needed for vomiting. ROBITUSSIN DM 100-10 MG/5ML SYRP (DEXTROMETHORPHAN-GUAIFENESIN) take 10 cc by mouth three times a day as needed for cough. CELEXA 40 MG TABS (CITALOPRAM HYDROBROMIDE) Take 1/4 tablet by mouth once a day for 4 days in morning, then  stop HYDROXYZINE PAMOATE 25 MG CAPS (HYDROXYZINE PAMOATE) Take one tab by mouth every 6 hours as needed for anxiety ALPRAZOLAM 0.5 MG TABS (ALPRAZOLAM) take 1 pill by mouth three times a day as needed for anxiety. CYMBALTA 20 MG CPEP (DULOXETINE HCL) Take 1 tablet by mouth once a day for 4 days in morning, then Take 1 tablet by mouth two times a day MS CONTIN 15 MG XR12H-TAB (MORPHINE SULFATE) Take 1 tablet by mouth two times a day LOVENOX 100 MG/ML SOLN (ENOXAPARIN SODIUM) 100 mg subcutaneous injection two times a day for 4 days COUMADIN 5 MG TABS (WARFARIN SODIUM) Take 1 tablet by mouth once a day  Current Allergies: ! * DARVOCET ! * STRAWBERRIES ! * CATS ! * GIN  Prescriptions: COUMADIN 5 MG TABS (WARFARIN SODIUM) Take 1 tablet by mouth once a day  #31 x 6   Entered and Authorized by:   Jackson Latino MD   Signed by:   Jackson Latino MD on 02/25/2010   Method used:   Handwritten   RxID:   [id number] LOVENOX 100 MG/ML SOLN (ENOXAPARIN SODIUM) 100 mg subcutaneous injection two times a day for 4 days  #8 x 0   Entered and Authorized by:   Jackson Latino MD   Signed by:   Jackson Latino MD on 02/25/2010   Method used:   Handwritten   RxID:   [id number] MS CONTIN 15 MG XR12H-TAB (MORPHINE SULFATE) Take 1 tablet by mouth two times a day  #60 x 0   Entered  and Authorized by:   Jackson Latino MD   Signed by:   Jackson Latino MD on 02/25/2010   Method used:   Handwritten   RxID:   [id number] CYMBALTA 20 MG CPEP (DULOXETINE HCL) Take 1 tablet by mouth once a day for 4 days in morning, then Take 1 tablet by mouth two times a day  #63 x 3   Entered and Authorized by:   Jackson Latino MD   Signed by:   Jackson Latino MD on 02/25/2010   Method used:   Handwritten   RxID:   [id number]    Patient Instructions: 1)  Please schedule a follow-up appointment at next Tuesday 2)  You will need four more days of lovenox treatment and coumadin 5 mg and needs to check  INR next tuesday and outpatient clinic follow up.

## 2011-01-31 ENCOUNTER — Encounter: Payer: Self-pay | Admitting: *Deleted

## 2011-01-31 NOTE — Miscellaneous (Signed)
[facility]: [facility]   Imported By: Margie Billet 12/18/2010 10:56:56  _____________________________________________________________________  External Attachment:    Type:   Image     Comment:   External Document

## 2011-01-31 NOTE — Discharge Summary (Signed)
Summary: Hospital Discharge Update 01/20/11    Hospital Discharge Update:  Date of Admission: 01/18/2011 Date of Discharge: 01/20/2011  Brief Summary:  Ms. Corp was admitted with hypotension and her BP improved after holding all of her medications. She was ruled out for PE with CTA. She has UTI which was treated with 2 days of rocephin and ciprofloxacin on outpt bases. She also had persistant lower leg swelling, edema and pain so dopplers were obtained and were positive for clots in both leg. Right leg has multiple vein involvement, and left has single deep vein thrombosis. 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 This also is high risk for her as she has a history of fall and atleast one episode of hematoma. We will stop norvasc, coreg and quinapril and only giver her HCTZ on discharge. She will need close follow up for BP monitoring. She will get lovenox and coumidin on discharge. She wants to leave today as she wants to attend a funeral in the family scheduled tomorrow. I will also send flag to Dr. Alexandria Lodge regarding the follow up of INR in 3 days.   I can not update the med or problem list as the note is open from office visit 2 days ago. So I will hold this document for now.  Labs needed at follow-up: CBC with differential, Basic metabolic panel, PT/INR  Other follow-up issues:  We also have withheld some of her psych/anxiety meds and other as needed medicines to avoid sedation and fall.  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 Removed medication of NIACIN CR 1000 MG CR-TABS (NIACIN) take 1 pill by mouth daily. Take aspirin 81 mg 30 minutes before taking niacin. Removed medication of NORVASC 10 MG TABS (AMLODIPINE BESYLATE) take 1 pill by mouth daily. Removed medication of  PROMETHAZINE HCL 25 MG TABS (PROMETHAZINE HCL) take 1 pill by mouth three times a day as needed for vomiting. Removed medication of ROBITUSSIN DM 100-10 MG/5ML SYRP (DEXTROMETHORPHAN-GUAIFENESIN) take 10 cc by mouth three times a day as needed for cough. Removed medication of MIRTAZAPINE 15 MG TABS (MIRTAZAPINE) 1/2 at night Removed medication of ZOLPIDEM TARTRATE 10 MG TABS (ZOLPIDEM TARTRATE) 1 at night as needed Removed medication of SEROQUEL 100 MG TABS (QUETIAPINE FUMARATE) 1/2-1 at night as needed Removed medication of ACCUPRIL 40 MG TABS (QUINAPRIL HCL) one a day Added new medication of CIPRO 250 MG TABS (CIPROFLOXACIN HCL) Take 1 tablet by mouth two times a day Added new medication of LOVENOX 120 MG/0.8ML SOLN (ENOXAPARIN SODIUM) two times a day [place] Added new medication of COUMADIN 7.5 MG TABS (WARFARIN SODIUM) once daily Rx of HYDROCHLOROTHIAZIDE 25 MG TABS (HYDROCHLOROTHIAZIDE) Take 1 tablet by mouth once a day;  #30 x 6;  Signed;  Entered by: Clerance Lav MD;  Authorized by: Clerance Lav MD;  Method used: Print then Give to Patient Rx of ALPRAZOLAM 0.5 MG TABS (ALPRAZOLAM) take 1 pill by mouth three times a day as needed for anxiety.;  #90 x 0;  Signed;  Entered by: Clerance Lav MD;  Authorized by: Clerance Lav MD;  Method used: Print then Give to Patient Rx of PERCOCET 5-325 MG TABS (OXYCODONE-ACETAMINOPHEN) take 1 pill by mouth as needed up to every 6 hourly.;  #120 x 0;  Signed;  Entered by: Clerance Lav  MD;  Authorized by: Clerance Lav MD;  Method used: Print then Give to Patient   Other patient instructions:  The out patient clinic will call you with appointment for Dr. Alexandria Lodge, as well as for regular follow up with the physician.  You are now on pills that thin your blood. It needs regular check up with the physicians office. Consume same amount of greens every day. If you fall and or have continued bleeding or trauma, please go to the nearest Emergency room or contact your  physician immediately. You were diagnosed with the blood clot in both of your legs. Since this has happened second time, you will require lifelong anticoagulation.   Prescriptions: PERCOCET 5-325 MG TABS (OXYCODONE-ACETAMINOPHEN) take 1 pill by mouth as needed up to every 6 hourly.  #120 x 0   Entered and Authorized by:   Clerance Lav MD   Signed by:   Clerance Lav MD on 01/20/2011   Method used:   Print then Give to Patient   RxID:   [id number] ALPRAZOLAM 0.5 MG TABS (ALPRAZOLAM) take 1 pill by mouth three times a day as needed for anxiety.  #90 x 0   Entered and Authorized by:   Clerance Lav MD   Signed by:   Clerance Lav MD on 01/20/2011   Method used:   Print then Give to Patient   RxID:   [id number] HYDROCHLOROTHIAZIDE 25 MG TABS (HYDROCHLOROTHIAZIDE) Take 1 tablet by mouth once a day  #30 x 6   Entered and Authorized by:   Clerance Lav MD   Signed by:   Clerance Lav MD on 01/20/2011   Method used:   Print then Give to Patient   RxID:   [id number]

## 2011-01-31 NOTE — Assessment & Plan Note (Addendum)
Summary: NEED MEDICATION/SB.   Vital Signs:  Patient profile:   50 year old female Height:      64 inches Weight:      253.1 pounds BMI:     43.60 Temp:     97.5 degrees F oral Pulse rate:   80 / minute Pulse (ortho):   81 / minute BP sitting:   89 / 63  (right arm) BP standing:   84 / 63  (right arm) Cuff size:   large  Vitals Entered By: Chinita Pester RN (January 18, 2011 11:34 AM) CC: Right leg pain/selling. Stated she felled about 3 days ago. Pain medication refil. To be tested for diabetes. Wants to see a Rheumatoogist and Dr. Darrick Penna., Hypertension Management Is Patient Diabetic? No Pain Assessment Patient in pain? yes     Location: legs Intensity: 9 Type: aching Onset of pain  Constant Nutritional Status BMI of > 30 = obese  Have you ever been in a relationship where you felt threatened, hurt or afraid?Unable to ask; someone w/pt.   Does patient need assistance? Ambulation Impaired:Risk for fall Comments uses a cane   Primary Care Provider:  Zoila Shutter MD  CC:  Right leg pain/selling. Stated she felled about 3 days ago. Pain medication refil. To be tested for diabetes. Wants to see a Rheumatoogist and Dr. Darrick Penna. and Hypertension Management.  History of Present Illness: 50 yr old who comes in today with her significant other. As usual her thinking is somewhat discombobulated. However she is much calmer today.   I did get records from the [facility], but they were just a couple of office visits. Her last OV was 09/19/10 and then she had missed a visit. She said she was there in 11/11 and they added hydroxyzine for what sounds like neurotic excoriations. I did explain to her that many of her psychotropic meds are also sedating, in addition to her percocet.     She reports her right leg is more painful  She request rheum visit for evaluation of her arthritis.  She is currently taking gabapenting three times a day and occasionally takes 1.5 pills at  night.   She reports fevers up to 100F for the past 2 weeks.  She reports some mild dysuria but denies  increased frequency.  She reports some decreased appetite and nasuea with vomiting x 3 (non-bloody, non-bilious).   two days ago - this has resolved after taking phenergen.    She denies sinus congestion, nasal drainage.  She reports a mild cough productive of yellow sputum for 2 weeks.  She denies any sick contacts.  Denies diarrhea.    Depression History:      The patient denies a depressed mood most of the day and a diminished interest in her usual daily activities.        Comments:  "The  pain makes me depressed.".  Hypertension History:      Positive major cardiovascular risk factors include hyperlipidemia, hypertension, and current tobacco user.  Negative major cardiovascular risk factors include female age less than 77 years old.        Positive history for target organ damage include peripheral vascular disease.      Preventive Screening-Counseling & Management  Alcohol-Tobacco     Alcohol drinks/day: 0     Smoking Status: current     Smoking Cessation Counseling: yes     Packs/Day: 1 pp 2 weeks     Year Started: 1/4 pack month  Caffeine-Diet-Exercise  Does Patient Exercise: yes     Type of exercise: walking     Times/week: 5  Allergies: 1)  ! * Darvocet 2)  ! * Strawberries 3)  ! * Cats 4)  ! * Gin  Past History:  Past Medical History: Last updated: 02/27/2009 Hypertension Peripheral vascular disease w/ intermittent claudication      -s/p L fem-pop bypass 11/21/08           graft occluded 12/28/08      -aortogram w/ bilat LE runoff           1.  bilat diffuse SFA occlusive dz           2.  Mod to severe above-knee popliteal dz.           3.  Bilat 3-vessel runoff w/ mild tibial occlusive dz. 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 As per #1, UDS and confirmation done today and pending. Will increase neurontin to 800mg  in AM and midday, then 1200mg  at night. If this dose has helped but she feels that she needs more she can then increase the PM dose to 1600mg . Will call in prescription. Orders: T-Drug Screen-Urine, (single) [phone]) T- * Misc. Laboratory test [phone])  Complete Medication List: 1)  Hydrochlorothiazide 25 Mg Tabs (Hydrochlorothiazide) .... Take 1 tablet by mouth once a day 2)  Aspirin 81 Mg Tabs (Aspirin) .... Take 1 tablet by mouth once a day 3)  Coreg 25 Mg Tabs (Carvedilol) .... Take 1 pill by mouth two times a day. 4)  Lipitor 40 Mg Tabs (Atorvastatin calcium) .... Take 1 pill by mouth daily. 5)  Proair Hfa 108 (90 Base) Mcg/act Aers (Albuterol sulfate) .... Take 1-2 puffs as needed every 4-6 hours as needed for shortness of breath 6)  Niacin Cr 1000 Mg Cr-tabs (Niacin) .... Take 1 pill by mouth daily. take aspirin 81 mg 30 minutes before taking niacin. 7)  Neurontin 800 Mg Tabs (Gabapentin) .... Take 1 pill by mouth in the morning, 1 pill by mouth at midday, and 2 pills by mouth at night 8)  Norvasc 10 Mg Tabs (Amlodipine besylate) .... Take 1 pill  by mouth daily. 9)  Promethazine Hcl 25 Mg Tabs (Promethazine hcl) .... Take 1 pill by mouth three times a day as needed for vomiting. 10)  Robitussin Dm 100-10 Mg/81ml Syrp (Dextromethorphan-guaifenesin) .... Take 10 cc by mouth three times a day as needed for cough. 11)  Alprazolam 0.5 Mg Tabs (Alprazolam) .... Take 1 pill by mouth three times a day as needed for anxiety. 12)  Cymbalta 60 Mg Cpep (Duloxetine hcl) .... One 2 times a day 13)  Percocet 5-325  Mg Tabs (Oxycodone-acetaminophen) .... Take 1 pill by mouth as needed up to every 6 hourly. 14)  Geodon 60 Mg Caps (Ziprasidone hcl) .... One 2 times a day 15)  Mirtazapine 15 Mg Tabs (Mirtazapine) .... 1/2 at night 16)  Zolpidem Tartrate 10 Mg Tabs (Zolpidem tartrate) .Marland Kitchen.. 1 at night as needed 17)  Seroquel 100 Mg Tabs (Quetiapine fumarate) .... 1/2-1 at night as needed 18)  Accupril 40 Mg Tabs (Quinapril hcl) .... One a day  Other Orders: T-CMP with Estimated GFR ([id number]) T-CBC w/Diff ([id number]) T-Urinalysis ([id number]) T-Culture, Urine ([id number]) T-Culture, Blood Routine ([id number])  Hypertension Assessment/Plan:      The patient's hypertensive risk group is category C: Target organ damage and/or diabetes.  Her calculated 10 year risk of coronary heart disease is 6 %.  Today's blood pressure is 89/63.     Orders Added: 1)  T-CMP with Estimated GFR [[id number]] 2)  T-CBC w/Diff [[id number]] 3)  T-Urinalysis [[id number]] 4)  T-Culture, Urine [[id number]] 5)  T-Culture, Blood Routine [[id number]]    Prevention & Chronic Care Immunizations   Influenza vaccine: Fluvax 3+  (09/25/2009)   Influenza vaccine deferral: Deferred  (04/02/2010)   Influenza vaccine due: 08/31/2011    Tetanus booster: 01/12/2010: Tdap   Td booster deferral: Not indicated  (07/05/2010)   Tetanus booster due: 01/13/2020    Pneumococcal vaccine: Not documented   Pneumococcal vaccine deferral: Not indicated  (07/05/2010)  Other  Screening   Pap smear: Not documented   Pap smear action/deferral: GYN Referral  (04/02/2010)    Mammogram: Assessment: BIRADS 4. Suspicious.  IMPRESSION: 1.  Lobulated heterogeneous solid and cystic mass 10 o'clock right  breast measuring approximately  4.6 cm in greatest dimension is indeterminate.  Malignancy is not  excluded.  Ultrasound-guided core needle biopsy is recommended. 2.  Prominent lymph node in axillary tail of the right breast.   Particularly in light of the  findings in the 10 o'clock region of the right breast,  ultrasound-guided core needle biopsy of this lymph node is suggested at the time of biopsy. 3.  No evidence of malignancy in the left breast.   Recommend ultrasound-guided core needle biopsy of palpable right  breast mass and prominent lymph  node in the axillary tail of the right breast.  (03/10/2008)   Mammogram action/deferral: Ordered  (08/08/2010)   Smoking status: current  (01/18/2011)   Smoking cessation counseling: yes  (01/18/2011)  Lipids   Total Cholesterol: 121  (08/08/2010)   Lipid panel action/deferral: Lipid Panel ordered   LDL: 62  (08/08/2010)   LDL Direct: Not documented   HDL: 30  (08/08/2010)   Triglycerides: 145  (08/08/2010)   Lipid panel due: 08/09/2011    SGOT (AST): 14  (08/08/2010)   BMP action: Ordered   SGPT (ALT): 16  (08/08/2010)   Alkaline phosphatase: 76  (08/08/2010)   Total bilirubin: 0.3  (08/08/2010)   Liver panel due: 08/09/2011  Hypertension   Last Blood Pressure: 89 / 63  (01/18/2011)   Serum creatinine: 0.73  (08/08/2010)   BMP action: Ordered   Serum potassium 3.8  (08/08/2010)   Basic metabolic panel due: 08/09/2011  Self-Management Support :   Personal Goals (by the next clinic visit) :      Personal blood pressure goal: 130/80  (09/25/2009)     Personal LDL goal: 70  (09/25/2009)    Hypertension self-management support: Written self-care plan  (12/18/2010)    Lipid self-management  support: Written self-care plan  (12/18/2010)  Process Orders Check Orders Results:     [facility]: Order checked:     Nelda Bucks DO NOT AUTHORIZED TO ORDER Tests Sent for requisitioning (January 20, 2011 2:13 PM):     01/18/2011: [facility] -- T-CMP with Estimated GFR [[id number]] (signed)     01/18/2011: [facility] -- T-CBC w/Diff [[id number]] (signed)     01/18/2011: [facility] -- T-Urinalysis [[id number]] (signed)     01/18/2011: [facility] -- T-Culture, Urine [[id number]] (signed)     01/18/2011: [facility] -- T-Culture, Blood Routine [[id number]] (signed)   #22G angiocath NSL started in right wrist with good blood return; site unremarkable.  Chinita Pester RN  January 18, 2011 1:30 PM   Appended Document: NEED MEDICATION/SB.    Clinical Lists Changes  Problems: Added new problem of HYPOTENSION (ICD-458.9) Added new problem of ALTERED MENTAL STATUS (ICD-780.97) Assessed HYPOTENSION as comment only - Pts BP is significantly below her baseline.  Her mental status seems slightly altered and given her report of fevers, dysuria, and productive cough, I am concerned she may have developing sepsis.   - Will admit her to the hospital today - Check blood cx, u/a with cx, CXR  Please refer to hospital admission note for full details and plan Assessed ALTERED MENTAL STATUS as comment only - Please see #1.  ? if AMS is 2/2 hypotension or overuse of narcotic/psychotropic meds.  Will admit to hospital for further eval. Orders: Added new Service order of Est. Patient Level V (64403) - Signed Observations: Added new observation of PSYCH COMM: Oriented X3, pt slightly somnolent. rousable to voice.Oriented X3, not anxious appearing, and not depressed appearing.   (01/21/2011 8:46) Added new observation of CERVIC NODES: no anterior cervical adenopathy and no posterior cervical adenopathy.    (01/21/2011 8:46) Added new observation of SKIN SQ INSP: warm and dry. turgor normal and color normal.   (01/21/2011 8:46) Added new observation of NEURO EXAM: alert & oriented X3, cranial nerves II-XII intact, strength normal in all extremities, gait normal, and DTRs symmetrical and normal.   (01/21/2011 8:46) Added new observation of MSK EXAM: normal ROM, no joint tenderness, and no joint swelling.   (01/21/2011 8:46) Added new observation of EXTREMITIES: No c/c/e.   (01/21/2011 8:46) Added new observation of ABDOMEN EXAM: +diffuse mild TTP. soft, normal bowel sounds, no distention, and no guarding.   (01/21/2011 8:46) Added new observation of HEART EXAM: normal rate and regular rhythm.  No M/R/G (01/21/2011 8:46) Added new observation of LUNG EXAM: normal respiratory effort, no accessory muscle use, and normal breath sounds.   (01/21/2011 8:46) Added new observation of NECK EXAM: supple and no masses.   (01/21/2011 8:46) Added new observation of ORAL EXAM: pharynx pink and moist.   (01/21/2011 8:46) Added new observation of EYE EXAM: vision grossly intact.  EOMI.  PERRL.  anicteric (01/21/2011 8:46) Added new observation of HD/FACE INSP: normocephalic and atraumatic.   (01/21/2011 8:46) Added new observation of PEADULT: Nelda Bucks DO ~General`Gen appear ~Head`hd/face insp ~Eyes`Eye exam ~Mouth`Oral exam ~Neck`NECK EXAM ~Lungs`lung exam ~Heart`Heart exam ~Abdomen`Abdomen exam ~Extremities`Extremities ~Msk`MSK EXAM ~Neurologic`Neuro exam ~Skin`skin sq insp ~Cervical Nodes`cervic nodes ~Psych`psych comm (01/21/2011 8:46) Added new observation of GEN APPEAR: alert.  more stable today, not tearful or anxious appearingwell-developed and well-nourished.   (01/21/2011 8:46) Added new observation of PMH OTHER: Past medical, surgical, family and social histories (including risk factors) reviewed for relevance to current acute and chronic problems. (01/21/2011 8:46) Added new observation  of SH REVIEWED:  reviewed - no changes required (01/21/2011 8:46) Added new observation of FH REVIEWED: reviewed - no changes required (01/21/2011 8:46) Added new observation of PSH REVIEWED: reviewed - no changes required (01/21/2011 8:46) Added new observation of PMH REVIEWED: reviewed - no changes required (01/21/2011 8:46)       Impression & Recommendations:  Problem # 1:  HYPOTENSION (ICD-458.9) Pts BP is significantly below her baseline.  Her mental status seems slightly altered and given her report of fevers, dysuria, and productive cough, I am concerned she may have developing sepsis.   - Will admit her to the hospital today - Check blood cx, u/a with cx, CXR  Please refer to hospital admission note for full details and plan  Problem # 2:  ALTERED MENTAL STATUS (ICD-780.97) Please see #1.  ? if AMS is 2/2 hypotension or overuse of narcotic/psychotropic meds.  Will admit to hospital for further eval.  Complete Medication List: 1)  Hydrochlorothiazide 25 Mg Tabs (Hydrochlorothiazide) .... Take 1 tablet by mouth once a day 2)  Aspirin 81 Mg Tabs (Aspirin) .... Take 1 tablet by mouth once a day 3)  Lipitor 40 Mg Tabs (Atorvastatin calcium) .... Take 1 pill by mouth daily. 4)  Proair Hfa 108 (90 Base) Mcg/act Aers (Albuterol sulfate) .... Take 1-2 puffs as needed every 4-6 hours as needed for shortness of breath 5)  Neurontin 800 Mg Tabs (Gabapentin) .... Take 1 pill by mouth in the morning and a half in the evening 6)  Alprazolam 0.5 Mg Tabs (Alprazolam) .... Take 1 pill by mouth three times a day as needed for anxiety. 7)  Cymbalta 60 Mg Cpep (Duloxetine hcl) .... One 2 times a day 8)  Percocet 5-325 Mg Tabs (Oxycodone-acetaminophen) .... Take 1 pill by mouth as needed up to every 6 hourly. 9)  Geodon 60 Mg Caps (Ziprasidone hcl) .... One 2 times a day 10)  Cipro 250 Mg Tabs (Ciprofloxacin hcl) .... Take 1 tablet by mouth two times a day 11)  Coumadin 7.5 Mg Tabs (Warfarin sodium) .... Once  daily 12)  Ambien 10 Mg Tabs (Zolpidem tartrate) .... Take 1 tablet by mouth once a day   Past History:  Past medical, surgical, family and social histories (including risk factors) reviewed for relevance to current acute and chronic problems.  Past Medical History: Reviewed history from 02/27/2009 and no changes required. Hypertension Peripheral vascular disease w/ intermittent claudication      -s/p L fem-pop bypass 11/21/08           graft occluded 12/28/08      -aortogram w/ bilat LE runoff           1.  bilat diffuse SFA occlusive dz           2.  Mod to severe above-knee popliteal dz.           3.  Bilat 3-vessel runoff w/ mild tibial occlusive dz. Tobacco abuse Irregular menses starting 09/08 Generalized anxiety Hyperlipidemia Knee pain with crepitus, b/l, worse in right knee, 03/10 SKin color changes left upper inner thigh following vascular surgery, 1st documented 03/10   Family History: Reviewed history and no changes required.  Social History: Reviewed history from 12/18/2010 and no changes required. 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  more stable today, not tearful or anxious appearingwell-developed and well-nourished.   Head:  normocephalic and atraumatic.   Eyes:  vision grossly intact.  EOMI.  PERRL.  anicteric Mouth:  pharynx pink and moist.   Neck:  supple and no masses.   Lungs:  normal respiratory effort, no accessory muscle use, and normal breath sounds.   Heart:  normal rate and regular rhythm.  No M/R/G Abdomen:  +diffuse mild TTP. soft, normal bowel sounds, no distention, and no guarding.   Msk:  normal ROM, no joint tenderness, and no joint swelling.   Extremities:  No c/c/e.     Neurologic:  alert & oriented X3, cranial nerves II-XII intact, strength normal in all extremities, gait normal, and DTRs symmetrical and normal.   Skin:  warm and dry. turgor normal and color normal.   Cervical Nodes:  no anterior cervical adenopathy and no posterior cervical adenopathy.   Psych:  Oriented X3, pt slightly somnolent. rousable to voice.Oriented X3, not anxious appearing, and not depressed appearing.

## 2011-01-31 NOTE — Assessment & Plan Note (Addendum)
Summary: opccou/gg  Anticoagulant Therapy Managed by: Barbera Setters. Janie Morning  PharmD CACP PCP: Zoila Shutter MD [facility] Attending: Margarito Liner MD Indication 1: Deep vein thrombus Indication 2: Encounter for therapeutic drug monitoring  V58.83 Start date: 02/22/2010 Duration: 6 months  Patient Assessment Reviewed by: Chancy Milroy PharmD  January 24, 2011 Medication review: verified warfarin dosage & schedule,verified previous prescription medications, verified doses & any changes, verified new medications, reviewed OTC medications, reviewed OTC health products-vitamins supplements etc Complications: none Dietary changes: none   Health status changes: none   Lifestyle changes: none   Recent/future hospitalizations: none   Recent/future procedures: none   Recent/future dental: none Patient Assessment Part 2:  Have you MISSED ANY DOSES or CHANGED TABLETS?  No missed Warfarin doses or changed tablets.  Have you had any BRUISING or BLEEDING ( nose or gum bleeds,blood in urine or stool)?  No reported bruising or bleeding in nose, gums, urine, stool.  Have you STARTED or STOPPED any MEDICATIONS, including OTC meds,herbals or supplements?  No other medications or herbal supplements were started or stopped.  Have you CHANGED your DIET, especially green vegetables,or ALCOHOL intake?  No changes in diet or alcohol intake.  Have you had any ILLNESSES or HOSPITALIZATIONS?  YES. See hospital note.  Have you had any signs of CLOTTING?(chest discomfort,dizziness,shortness of breath,arms tingling,slurred speech,swelling or redness in leg)    No chest discomfort, dizziness, shortness of breath, tingling in arm, slurred speech, swelling, or redness in leg.     Treatment  Target INR: 2.0-3.0 INR: 3.2  Date: 01/24/2011 Regimen In:  50mg /wk INR reflects regimen in: 3.2  New  Tablet strength: : 7.5mg  Regimen Out:     Sunday: 1 Tablet     Monday: 1 Tablet     Tuesday: 1 Tablet     Wednesday:  1 Tablet     Thursday: 1/2 Tablet      Friday: 1 Tablet     Saturday: 1 Tablet Total Weekly: 48.75mg /week mg  Next INR Due: 01/28/2011 Adjusted by: Barbera Setters. Daizha Anand III PharmD CACP   Return to anticoagulation clinic:  01/28/2011 Time of next visit: 1000   Comments: Patient will CONTINUE Lovenox bridge therapy 1mg /kg subcutaneously q12h (has 6 remaining syringes) + continued warfarin as documented above. Patient will f/u on Monday 30-Jan-12 at 1000h.  Allergies: 1)  ! * Darvocet 2)  ! * Strawberries 3)  ! * Cats 4)  ! * Gin

## 2011-01-31 NOTE — Assessment & Plan Note (Signed)
Summary: HFU/DVT/gg   Vital Signs:  Patient profile:   50 year old female Height:      64 inches (162.56 cm) Weight:      257.8 pounds (117.18 kg) BMI:     44.41 Temp:     97.1 degrees F (36.17 degrees C) oral Pulse rate:   88 / minute Pulse (ortho):   91 / minute BP sitting:   125 / 82  (left arm) BP standing:   134 / 92  Vitals Entered By: Stanton Kidney Ditzler RN (January 24, 2011 11:58 AM)  Serial Vital Signs/Assessments:  Time      Position  BP       Pulse  Resp  Temp     By 12:15PM   Lying RA  138/90   90                    Debra Ditzler RN 12:15PM   Sitting   131/93   90                    Debra Ditzler RN 12:15PM   Standing  134/92   91                    Debra Ditzler RN  Is Patient Diabetic? No Pain Assessment Patient in pain? yes     Location: both legs Intensity: 8 Type: tender Onset of pain  since last visit Nutritional Status BMI of > 30 = obese Nutritional Status Detail appetite good  Have you ever been in a relationship where you felt threatened, hurt or afraid?denies   Does patient need assistance? Functional Status Self care Ambulation Normal Comments Friend with pt. Cont to have swelling both legs - wearing TED stocking.   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         Preventive Screening-Counseling & Management  Alcohol-Tobacco     Alcohol drinks/day: 0     Smoking Status: current     Smoking Cessation  Counseling: yes     Packs/Day: 1/3 ppd     Year Started: 1/4 pack month  Caffeine-Diet-Exercise     Does Patient Exercise: yes     Type of exercise: walking     Times/week: 5  Current Medications (verified): 1)  Hydrochlorothiazide 25 Mg Tabs (Hydrochlorothiazide) .... Take 1 Tablet By Mouth Once A Day 2)  Aspirin 81 Mg Tabs (Aspirin) .... Take 1 Tablet By Mouth Once A Day 3)  Lipitor 40 Mg Tabs (Atorvastatin Calcium) .... Take 1 Pill By Mouth Daily. 4)  Proair Hfa 108 (90 Base) Mcg/act Aers (Albuterol Sulfate) .... Take 1-2 Puffs As Needed Every 4-6 Hours As Needed For Shortness of Breath 5)  Neurontin 800 Mg Tabs (Gabapentin) .... Take 1 Pill By Mouth in The Morning, 1 Pill By Mouth At Midday, and 2 Pills By Mouth At  Night 6)  Alprazolam 0.5 Mg Tabs (Alprazolam) .... Take 1 Pill By Mouth Three Times A Day As Needed For Anxiety. 7)  Cymbalta 60 Mg Cpep (Duloxetine Hcl) .... One 2 Times A Day 8)  Percocet 5-325 Mg Tabs (Oxycodone-Acetaminophen) .... Take 1 Pill By Mouth As Needed Up To Every 6 Hourly. 9)  Geodon 60 Mg Caps (Ziprasidone Hcl) .... One 2 Times A Day 10)  Cipro 250 Mg Tabs (Ciprofloxacin Hcl) .... Take 1 Tablet By Mouth Two Times A Day 11)  Lovenox 120 Mg/0.53ml Soln (Enoxaparin Sodium) .... Two Times A Day [place] 12)  Coumadin 7.5 Mg Tabs (Warfarin Sodium) .... Once Daily  Allergies: 1)  ! * Darvocet 2)  ! * Strawberries 3)  ! * Cats 4)  ! * Gin  Social History: Packs/Day:  1/3 ppd  Review of Systems       The patient complains of peripheral edema and difficulty walking.  The patient denies anorexia, fever, weight loss, weight gain, vision loss, decreased hearing, hoarseness, chest pain, syncope, dyspnea on exertion, prolonged cough, headaches, hemoptysis, abdominal pain, melena, hematochezia, severe indigestion/heartburn, hematuria, incontinence, genital sores, muscle weakness, suspicious skin lesions, transient blindness, depression, unusual weight change, abnormal  bleeding, enlarged lymph nodes, angioedema, breast masses, and testicular masses.    Physical Exam  General:  Gen: VS reveiwed, Alert, well developed, nodistress ENT: mucous membranes pink & moist. No abnormal finds in ear and nose. CVC:S1 S2 , no murmurs, no abnormal heart sounds. Lungs: Clear to auscultation B/L. No wheezes, crackles or other abnormal sounds Abdomen: soft, non distended, no tender. Normal Bowel sounds EXT: 2+ pitting edema, no engorged veins, Pulsations normal  Neuro:alert, oriented *3, cranial nerved 2-12 intact, strenght normal in all  extremities, senstations normal to light touch.      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 Alexandria Lodge today - Will check BMET and CBC - Will reduce neurontin  - will increase the dose of her oxycodone next week if she is not drowsy and will also d/w Dr. Coralee Pesa  Orders: T-Basic Metabolic Panel [phone]) T-CBC w/Diff ([phone])   I have a reviewed the previous records including hospital and ED records, radiographs and lab values. Spent more than 35 minutes discussing the importance of medication compliance, managment of chronic diseases and lifestyle changes with the patient.    Complete Medication List: 1)  Hydrochlorothiazide 25 Mg Tabs (Hydrochlorothiazide) .... Take 1 tablet by mouth once a day 2)  Aspirin 81 Mg Tabs (Aspirin) .... Take 1 tablet by mouth once a day 3)  Lipitor 40 Mg Tabs (Atorvastatin calcium) .... Take 1 pill by mouth daily. 4)  Proair Hfa 108 (90 Base) Mcg/act Aers (Albuterol sulfate)  .... Take 1-2 puffs as needed every 4-6 hours as needed for shortness of breath 5)  Neurontin 800 Mg Tabs (Gabapentin) .... Take 1 pill by mouth in the morning and a half in the evening 6)  Alprazolam 0.5 Mg Tabs (Alprazolam) .... Take 1 pill by mouth three times  a day as needed for anxiety. 7)  Cymbalta 60 Mg Cpep (Duloxetine hcl) .... One 2 times a day 8)  Percocet 5-325 Mg Tabs (Oxycodone-acetaminophen) .... Take 1 pill by mouth as needed up to every 6 hourly. 9)  Geodon 60 Mg Caps (Ziprasidone hcl) .... One 2 times a day 10)  Cipro 250 Mg Tabs (Ciprofloxacin hcl) .... Take 1 tablet by mouth two times a day 11)  Lovenox 120 Mg/0.37ml Soln (Enoxaparin sodium) .... Two times a day [place] 12)  Coumadin 7.5 Mg Tabs (Warfarin sodium) .... Once daily  Patient Instructions: 1)  Follw up in 7-10 days   Orders Added: 1)  T-Basic Metabolic Panel [[id number]] 2)  T-CBC w/Diff [[id number]] 3)  Est. Patient Level IV [14782]   Process Orders Check Orders Results:     [facility]: Check successful Tests Sent for requisitioning (January 24, 2011 12:53 PM):     01/24/2011: [facility] -- T-Basic Metabolic Panel [phone] (signed)     01/24/2011: [facility] -- T-CBC w/Diff [[id number]] (signed)     Prevention & Chronic Care Immunizations   Influenza vaccine: Fluvax 3+  (09/25/2009)   Influenza vaccine deferral: Deferred  (04/02/2010)   Influenza vaccine due: 08/31/2011    Tetanus booster: 01/12/2010: Tdap   Td booster deferral: Not indicated  (07/05/2010)   Tetanus booster due: 01/13/2020    Pneumococcal vaccine: Not documented   Pneumococcal vaccine deferral: Not indicated  (07/05/2010)  Other Screening   Pap smear: Not documented   Pap smear action/deferral: GYN Referral  (04/02/2010)    Mammogram: Assessment: BIRADS 4. Suspicious.  IMPRESSION: 1.  Lobulated heterogeneous solid and cystic mass 10 o'clock right  breast measuring  approximately  4.6 cm in greatest dimension is indeterminate.  Malignancy is not  excluded.  Ultrasound-guided core needle biopsy is recommended. 2.  Prominent lymph node in axillary tail of the right breast.   Particularly in light of the  findings in the 10 o'clock region of the right breast,  ultrasound-guided core needle biopsy of this lymph node is suggested at the time of biopsy. 3.  No evidence of malignancy in the left breast.   Recommend ultrasound-guided core needle biopsy of palpable right  breast mass and prominent lymph  node in the axillary tail of the right breast.  (03/10/2008)   Mammogram action/deferral: Ordered  (08/08/2010)   Smoking status: current  (01/24/2011)   Smoking cessation counseling: yes  (01/24/2011)  Lipids   Total Cholesterol: 121  (08/08/2010)   Lipid panel action/deferral: Lipid Panel ordered   LDL: 62  (08/08/2010)   LDL Direct: Not documented   HDL: 30  (08/08/2010)   Triglycerides: 145  (08/08/2010)   Lipid panel due: 08/09/2011    SGOT (AST): 14  (08/08/2010)   BMP action: Ordered   SGPT (ALT): 16  (08/08/2010)   Alkaline phosphatase: 76  (08/08/2010)   Total bilirubin: 0.3  (08/08/2010)   Liver panel due: 08/09/2011  Hypertension   Last Blood Pressure: 125 / 82  (01/24/2011)   Serum creatinine: 0.73  (08/08/2010)   BMP action: Ordered   Serum potassium 3.8  (08/08/2010)   Basic metabolic panel due: 08/09/2011  Self-Management Support :   Personal Goals (by the next clinic visit) :      Personal blood pressure goal: 130/80  (09/25/2009)     Personal LDL goal: 70  (09/25/2009)    Patient will work on the following items until the next clinic visit to reach self-care  goals:     Medications and monitoring: take my medicines every day, bring all of my medications to every visit  (01/24/2011)     Eating: drink diet soda or water instead of juice or soda, eat more vegetables, use fresh or frozen vegetables, eat fruit for snacks and  desserts, limit or avoid alcohol  (01/24/2011)     Activity: take a 30 minute walk every day  (10/09/2010)     Other: drink tea and coffee all day - just eat what they bring me - did not bring any meds - leg swelling has stopped her walking  (04/02/2010)    Hypertension self-management support: Written self-care plan, Education handout, Resources for patients handout  (01/24/2011)   Hypertension self-care plan printed.   Hypertension education handout printed    Lipid self-management support: Written self-care plan, Education handout, Resources for patients handout  (01/24/2011)   Lipid self-care plan printed.   Lipid education handout printed      Resource handout printed.  Appended Document: HFU/DVT/gg    Clinical Lists Changes  Medications: Added new medication of AMBIEN 10 MG TABS (ZOLPIDEM TARTRATE) Take 1 tablet by mouth once a day      Appended Document: HFU/DVT/gg D/W Dr. Coralee Pesa about the possibility of increasin her narcotics for worsening pain in setting of DVTs. She suggested trying tramadol short term as patient has multiple negative UDS and narcotic abuse behavior.

## 2011-01-31 NOTE — Initial Assessments (Signed)
INTERNAL MEDICINE ADMISSION HISTORY AND PHYSICAL Admission Date: 01/18/2010 Attending Physician: Dr. 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 With further  questioning she noted that she was feeling dizzy and fell 3 days ago while getting up out of the bed. She tumbled and found herself on the floor. She is not sure if she lost consciousness but did not have any loss of bladder or bowel control. Since then she noticed pain in the right hip area and knee - the pain in the right knee is now more then usual.  Her friend who lives with her found her on the floor. She further mentioned that she has having some fevers and chills with a Temp of 100 F. She noted some cough: productive, yellowish in color and SOB but no chest pain. Furthermore she mentioned that she had burning sensation with urination and increased urin frequency. She reported that she was nauseated and had 3 episodes of emesis, (non-bilious, non-bloody) with decreased by mouth intake and decreased appetite. She tried phenergan which improved her symptoms.  Patient denied any diarrhea or sick contact.   ALLERGIES: ! * DARVOCET ! * STRAWBERRIES ! * CATS ! * GIN  PAST MEDICAL HISTORY: Hypertension Peripheral vascular disease w/ intermittent claudication      -s/p L fem-pop bypass 11/21/08           graft occluded 12/28/08      -aortogram w/ bilat LE runoff           1.  bilat diffuse SFA occlusive dz           2.  Mod to severe above-knee popliteal dz.           3.  Bilat 3-vessel runoff w/ mild tibial occlusive dz. Tobacco  abuse Irregular menses starting 09/08 Generalized anxiety Hyperlipidemia Knee pain with crepitus, b/l, worse in right knee, 03/10 SKin color changes left upper inner thigh following vascular surgery, 1st documented 03/10 S/P right breast lumpectomy with benign tumor Right leg subacute deep venous thrombosis (01/2010)   MEDICATIONS: HYDROCHLOROTHIAZIDE 25 MG TABS (HYDROCHLOROTHIAZIDE) Take 1 tablet by mouth once a day ASPIRIN 81 MG TABS (ASPIRIN) Take 1 tablet by mouth once a day COREG 25 MG TABS (CARVEDILOL) take 1 pill by mouth two times a day. LIPITOR 40 MG TABS (ATORVASTATIN CALCIUM) take 1 pill by mouth daily. PROAIR HFA 108 (90 BASE) MCG/ACT AERS (ALBUTEROL SULFATE) Take 1-2 puffs as needed every 4-6 hours as needed for shortness of breath NIACIN CR 1000 MG CR-TABS (NIACIN) take 1 pill by mouth daily. Take aspirin 81 mg 30 minutes before taking niacin. NEURONTIN 800 MG TABS (GABAPENTIN) take 1 pill by mouth in the morning, 1 pill by mouth at midday, and 2 pills by mouth at night NORVASC 10 MG TABS (AMLODIPINE BESYLATE) take 1 pill by mouth daily. PROMETHAZINE HCL 25 MG TABS (PROMETHAZINE HCL) take 1 pill by mouth three times a day as needed for vomiting. ROBITUSSIN DM 100-10 MG/5ML SYRP (DEXTROMETHORPHAN-GUAIFENESIN) take 10 cc by mouth three times a day as needed for cough. ALPRAZOLAM 0.5 MG TABS (ALPRAZOLAM) take 1  pill by mouth three times a day as needed for anxiety. CYMBALTA 60 MG CPEP (DULOXETINE HCL) one 2 times a day PERCOCET 5-325 MG TABS (OXYCODONE-ACETAMINOPHEN) take 1 pill by mouth as needed up to every 6 hourly. 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  ROS:as per HPI  VITALS:  in the clinicT:  97.5 P: 80  BPsitting :89/63, standing 84/63   R: O2SAT:  ON: on the floor: T. 97.7 BP 101/70 P 78 RR 19 O2SAT: 96 ON:RA lying BP 95/61 P 82, standing BP 103/69 P 81  PHYSICAL EXAM: Gen: Patient is in NAD. Eyes: PERRL, EOMI, No signs of anemia or jaundince. ENT: MMM, OP clear, No erythema, thrush or exudates. Neck: Supple, No carotid Bruits. Resp: anterior wheezing present, good airmovement. No crackles. CVS: S1S2 RRR. GI: Abdomen is soft, obese. Right lower quandrant tenderness on palpation. NG. NR. BS+.  Ext: No pedal edema, cyanosis or clubbing. GU: right CVA tenderness. Skin: No visible rashes, scars. Lymph: No palpable lymphadenopathy. MS: Moving all 4 extremities. No joint swelling, but tenderness on palpation. Full range of motion.  Neuro: A&O X3, CN II - XII are grossly intact. Motor strength is 5/5 in the all 4 extremities, Sensations intact to light touch, Gait normal, Cerebellar signs negative.  Psych: lethargic. Changes topic during conversation.  LABS:  Sodium (NA)                              139               135-145          mEq/L  Potassium (K)                            3.9               3.5-5.1          mEq/L  Chloride                                 106               96-112           mEq/L  CO2                                      25                19-32            mEq/L  Glucose  123        h      70-99            mg/dL  BUN                                      6                 6-23             mg/dL  Creatinine                               0.84              0.4-1.2          mg/dL  GFR, Est Non African American            >60                >60              mL/min  GFR, Est African American                >60               >60              mL/min    Oversized comment, see footnote  1  Bilirubin, Total                         0.3               0.3-1.2          mg/dL  Alkaline Phosphatase                     61                39-117           U/L  SGOT (AST)                               16                0-37             U/L  SGPT (ALT)                               14                0-35             U/L  Total  Protein                           6.7               6.0-8.3          g/dL  Albumin-Blood                            3.2        l      3.5-5.2  g/dL  Calcium                                  8.6               8.4-10.5         mg/dL  Beta Natriuretic Peptide                 <30.0             0.0-100.0        pg/mL   Protime ( Prothrombin Time)              13.3              11.6-15.2        seconds  INR                                      0.99              0.00-1.49  Color, Urine                             YELLOW            YELLOW  Appearance                               CLEAR             CLEAR  Specific Gravity                         1.009             1.005-1.030  pH                                       6.0               5.0-8.0  Urine Glucose                            NEGATIVE          NEG              mg/dL  Bilirubin                                NEGATIVE          NEG  Ketones                                  NEGATIVE          NEG              mg/dL  Blood                                    NEGATIVE          NEG  Protein  NEGATIVE          NEG              mg/dL  Urobilinogen                             1.0               0.0-1.0          mg/dL  Nitrite                                  NEGATIVE          NEG  Leukocytes                               SMALL      a      NEG   Squamous Epithelial / LPF                FEW        a      RARE  WBC / HPF                                 11-20             <3               WBC/hpf  Bacteria / HPF                           RARE              RARE  Urine-Other                              SEE NOTE.    MUCOUS PRESENT    TRICHOMONAS PRESENT    RESULTS REPEATED  ASSESSMENT AND PLAN:  1. Hypotension possible due to decreased by mouth intake. DD Sepsis due to UTI and/or pneumonia since patient is complaining about SOB, cough, fever and dysuria. Other DD include medication induced.  - Will admit to Telemetry - NS IV fluids 1 liter bolus with 250 cc/hr - Blood cx, U/A with urine culture, CXR, TSH - Will start on Rocephin. Consider to broaden antibiotic therapy if Pneumonia present on CXR. If spiking fever consider to transfer to Step down and again broaden antibiotic therapy. Will await results, blood cx and urine cx for possible changes in management.  2. Right leg pain unclear ethiology. Physical exam no obvious abnormality. Most likely chronic pain.  Consider to imagine if pain persist.  At this point will treat conservatively with gabapentin and  percocet.  3. Dizziness likley due to Hypotension. Patient is not orthostatic. DD include neurologic since patient reported a fall highly although unlikely or cardiac again highly unlikely since pt was not complaining about any chest pain. Patient had an Echo in 2007 did not show anu AS but systolic function mildly decreased with an EF of 45 %. Hypokinesis of the mild distal posterior wall -Will admit to telemetry, ECG , hydrate with fluids.  -Consider to imagine if persistent. May also consider repeat Echo. Will discuss with Attending for furhter intervention.   4. Nausea/Vomiting  likely due to UTI. Unlikely GI related. Will continue to monitor.  - Zofran, Diet as tolerated  4. Anxiety:patient was more lethargic likely due to hypotension and UTI. Patient is normaly more anxious. Patient during conversation changes topics but this per PCP her usual state.  - Will  continues at this point Cymbalta, Ambien prn, Alprazolam, Geodon.  5. VTE PROPH: lovenox   Attending Physician:  I have seen and examined the patient. I reviewed the resident note and agree with the findings and plan of care as documented. My additions and revisions are included.    Signature:___________________________________________
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
## 2011-02-01 NOTE — Discharge Summary (Signed)
NAMEDURU, REIGER                 ACCOUNT NO.:  [id number]  MEDICAL RECORD NO.:  [id number]          PATIENT TYPE:  INP  LOCATION:  3704                         FACILITY:  [facility]  PHYSICIAN:  Julaine Fusi, MD   DATE OF BIRTH:  Dec 07, 1961  DATE OF ADMISSION:  01/18/2011 DATE OF DISCHARGE:  01/20/2011                              DISCHARGE SUMMARY   DISCHARGE DIAGNOSES: 1. Deep venous thrombosis bilateral unprovoked, history of right leg     subacute deep venous thrombosis in February 2011. 2. Urinary tract infection. 3. Peripheral vascular disease with intermittent claudication, status     post left femoropopliteal bypass in November 2009, graft occluded     in December 2009. 4. Hypertension. 5. Generalized anxiety. 6. Hyperlipidemia. 7. Chronic knee pain. 8. Status post right breast lumpectomy with benign tumor. 9. Tobacco abuse.  DISCHARGE MEDICATIONS: 1. Aspirin 81 mg once daily. 2. Lipitor 40 mg once daily. 3. Albuterol 1-2 puffs as needed every 4-6 hours as needed. 4. Neurontin 800 mg one pill in the morning and half in the evening. 5. Alprazolam 3 times a day as needed for anxiety. 6. Percocet 1 pill as needed q.6 h. p.r.n. for pain. 7. Cipro 250 mg p.o. b.i.d. 8. Coumadin 7.5 mg daily. 9. Lovenox 120 mg subcu 2 times a day. 10.Hydrochlorothiazide 25 mg once daily.  DISPOSITION AND FOLLOWUP:  The patient will follow up at the Outpatient [facility] with Dr. Michaell Cowing at the [facility].  At the next office visit, CBC, BMET, PT and INR should be rechecked.  The patient's psych anxiety medications were stopped on the day of discharge to avoid sedation and fall.  Furthermore, the patient's blood pressure medications were stopped except hydrochlorothiazide when the patient was presently with hypotension.  Consider to evaluate as an outpatient if medication is needed.  The patient was informed that she is on Coumadin and has high- risk for bleeding.  She was informed if she  had a fall and continues to bleed, after the trauma, she needs to go to the emergency room. Furthermore, she was informed that she will need lifelong anticoagulation, this is the second time of unprovoked DVT.  PROCEDURES PERFORMED:  Chest x-ray on January 18, 2011, showed the heart size is in normal.  The slight prominence of the pulmonary vascularity. There are tiny bilateral effusions, right greater than left.  There is small patchy area of density at the right lung base, which may represent tiny infiltrates.  No osseous abnormalities.  Impression:  Possible small infiltrate at the right lung base, tiny effusion.  A CT angio with contrast showed no evidence of acute pulmonary embolism.  Small layering right pleural effusion.  Right greater than left possible atelectasis. There is no specific mild mediastinal lymphadenopathy since February 2012.  Lower extremity Dopplers on January 20, 2011, summary findings of deep vein thrombosis involving the right lower extremity and left lower extremity.  BRIEF ADMISSION HISTORY AND PHYSICAL:  This is a 50 year old female with past medical history significant for peripheral vascular disease, status post left bypass graft, knee pain, anxiety, who present to the clinic just for  medication refills and right knee pain who was found to be hypotensive.  With further questioning, she noted that she was feeling dizzy and fell 3 days ago while getting up out of the bed.  She tumbled and found herself on the floor.  She is not sure if she lost consciousness, but did not have any loss of bladder or bowel control. Since then, she noticed pain in the right hip area and knee.  The pain in the right knee is normal than usual.  Her friend who lives with her found her on the floor.  She further mentioned that she is having some fevers and chills with a temp of 100 Fahrenheit.  She noticed some cough, productive, yellowish in color and shortness of breath, but  no chest pain.  Furthermore, she mentioned that she had burning sensation with urination includes urine frequency.  She reported that she has nausea and had 3 episodes of emesis, nonbilious, and nonbloody with decreased p.o. intake and decreased appetite.  She tried Phenergan, which improved her symptoms.  PHYSICAL EXAMINATION:  VITAL SIGNS:  Temperature 97.4, pulse 80, blood pressure 89/63, respiratory rate 19, and saturation 96% on room air. Orthostatic blood pressure lying 95/61 and pulse 82, standing 103/69 and pulse 81. GENERAL:  The patient is in no acute distress. HEENT:  PERRL.  EOMI.  No signs of any jaundice.  ENT:  Mucous membranes dry.  Oropharynx is clear.  No erythema or exudate. NECK:  Supple.  No carotid bruit. RESPIRATORY:  Anterior wheezing present.  Good air movement.  No crackles. HEART:  Regular rate and rhythm.  No murmurs, rubs, or gallops. ABDOMEN:  Soft, obese.  Right lower quadrant tenderness on palpation. No guarding.  No rebound tenderness.  Bowel sounds present. EXTREMITIES:  No pedal edema, cyanosis, or clubbing.  Right CVA tenderness. SKIN:  No visible rashes, scars. LYMPH:  No palpable lymphadenopathy. MUSCULOSKELETAL:  Moved right lower extremity knee down to ankle, nonpitting edema, tender to palpation more than the left lower extremity.  Moving all 4 extremities.  No joint swelling.  No erythema or full range of motion. NEURO:  Alert and oriented x3.  Cranial nerves II through XII are grossly intact.  Motor strength in 5/5 in all 4 extremities, sensation intact to light touch, gait normal, cerebellar sign is negative.  She is septic during conversation.  LABORATORY DATA:  Sodium of 139, potassium 3.9, chloride 106, CO2 is 25, glucose 123, BUN 6, creatinine 0.84, bilirubin 0.3, alk phos 61, AST 16, ALT 14, total protein 6.7, albumin 3.2, calcium of 8.6, BNP less than 30, PT 15.3, INR 0.99.  Urinalysis showed nitrites and small leukocytes with  wbc's of 11-20.  ASSESSMENT AND PLAN: 1. Right leg pain with edema secondary to bilateral DVT.  The patient     was started on Lovenox and Coumadin.  We will follow up at the     Coumadin clinic with Dr.  Michaell Cowing.  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  She was     informed that this was the high-risk for her as she has a history     of fall and at least one episode of hematoma.  Chest x-ray was     suspicious for PE with small patchy area infiltrate in the right     lower lobe.  Furthermore, the patient was intermediate probably,     therefore, a CT angio was performed,  which was negative for PE.     The patient was informed of the increased risk of bleeding and     needed continuous monitoring of her INR.  Furthermore, the patient     was informed if she had a fall and noticed bleeding, which does not     stop, she needs to follow up at the emergency room. 2. Urinary tract infection.  The patient was found to have pyuria.     Urine culture was nonspecific.  The patient was started on Rocephin     since the patient was symptomatic and was discharged on Cipro.  The     patient was afebrile.  No leukocytosis upon during hospital     admission. 3. Hypertension likely due to medication.  The patient was found to be     hypotensive admission throughout hospital stay because of infection     was present, they showed some infiltrative benign pneumonia.     Urinalysis was positive.  All the urine culture was nonspecific.     All blood pressure medications were discontinued except     hydrochlorothiazide considered to follow up as an outpatient for     possible changes in management.  DISCHARGE VITALS:  Temperature 98, pulse 90, respiratory rate 20, blood pressure 121/83, and saturation 97% on room air.  DISCHARGE LABS:  CBC, WBC 7.6, hemoglobin 12.1, hematocrit 36.8, and platelets 285.  BMET, sodium 139, potassium 4.1, chloride 108, SPO2  is 25, glucose 95, BUN 3, creatinine 6.73, and calcium of 13, hemoglobin A1c 6.5.  Blood cultures negative x2.  The patient was discharged in stable condition.    ______________________________ Almyra Deforest, MD   ______________________________ Julaine Fusi, MD    JI/MEDQ  D:  01/27/2011  T:  01/28/2011  Job:  [id number]  cc:   Dr. Coralee Pesa at the Outpatient Clinic.  Electronically Signed by Almyra Deforest MD on 01/31/2011 06:38:59 PM Electronically Signed by Mariea Stable MD on 02/01/2011 08:22:27 AM

## 2011-02-01 NOTE — Miscellaneous (Signed)
Summary: [facility]   Imported By: Margie Billet 09/13/2010 11:09:03  _____________________________________________________________________  External Attachment:    Type:   Image     Comment:   External Document

## 2011-02-05 ENCOUNTER — Telehealth: Payer: Self-pay | Admitting: *Deleted

## 2011-02-05 DIAGNOSIS — G8929 Other chronic pain: Secondary | ICD-10-CM

## 2011-02-05 NOTE — Telephone Encounter (Addendum)
Call from [place] at [facility] said that she had received a call from Theodoro Clock at the [facility].  They will no longer provide the pt with Benzo's or Valium refills.  Thurston Hole said that she will send a note to the Clinics.  Said that pt will be call soon for a refill of the Valium.  Thurston Hole can be reached at [phone] for questions.

## 2011-02-06 MED ORDER — TRAMADOL HCL 50 MG PO TABS: 50.0000 mg | ORAL_TABLET | Freq: Four times a day (QID) | ORAL | Status: DC | PRN

## 2011-02-06 NOTE — Telephone Encounter (Signed)
I called and spoke with patient's mental health provider, Thurston Hole. Patient has violated her medication contract. I called and spoke to Ms. Sieh at length about this. She last filled her percocet, ambien and aprazolam on 01/21/11 and I called so she would have time to taper off of these medications. Thurston Hole had recommended treatment for addiction which patient does not want to do, and I also strongly suggested this to patient. Per Thurston Hole patient had told them that I d/ced all benzodiazepines in 11/11 so they had started valium with a plan to taper it over one year. She is keeping her on geodon, serequel, and cymbalta and seeing her back in 2 weeks.

## 2011-02-06 NOTE — Assessment & Plan Note (Signed)
Summary: COU/CH  Anticoagulant Therapy Managed by: Barbera Setters. Alisha [name]  PharmD CACP PCP: Zoila Shutter MD [facility] AttendingReche Dixon MD, David Indication 1: Deep vein thrombus Indication 2: Encounter for therapeutic drug monitoring  V58.83 Start date: 02/22/2010 Duration: 6 months  Patient Assessment Reviewed by: Chancy Milroy PharmD  January 28, 2011 Medication review: verified warfarin dosage & schedule,verified previous prescription medications, verified doses & any changes, verified new medications, reviewed OTC medications, reviewed OTC health products-vitamins supplements etc Complications: none Dietary changes: none   Health status changes: none   Lifestyle changes: none   Recent/future hospitalizations: none   Recent/future procedures: none   Recent/future dental: none Patient Assessment Part 2:  Have you MISSED ANY DOSES or CHANGED TABLETS?  No missed Warfarin doses or changed tablets.  Have you had any BRUISING or BLEEDING ( nose or gum bleeds,blood in urine or stool)?  No reported bruising or bleeding in nose, gums, urine, stool.  Have you STARTED or STOPPED any MEDICATIONS, including OTC meds,herbals or supplements?  No other medications or herbal supplements were started or stopped.  Have you CHANGED your DIET, especially green vegetables,or ALCOHOL intake?  No changes in diet or alcohol intake.  Have you had any ILLNESSES or HOSPITALIZATIONS?  No reported illnesses or hospitalizations  Have you had any signs of CLOTTING?(chest discomfort,dizziness,shortness of breath,arms tingling,slurred speech,swelling or redness in leg)    No chest discomfort, dizziness, shortness of breath, tingling in arm, slurred speech, swelling, or redness in leg.     Treatment  Target INR: 2.0-3.0 INR: 2.6  Date: 01/28/2011 Regimen In:  48.75mg /week INR reflects regimen in: 2.6  New  Tablet strength: : 7.5mg  Regimen Out:     Sunday: 1 Tablet     Monday: 1 Tablet     Tuesday: 1  Tablet     Wednesday: 1/2 Tablet     Thursday: 1 Tablet      Friday: 1 Tablet     Saturday: 1 Tablet Total Weekly: 48.75mg /week mg  Next INR Due: 02/11/2011 Adjusted by: Barbera Setters. Alexandria Lodge III PharmD CACP   Return to anticoagulation clinic:  02/11/2011 Time of next visit: 0945   Comments: Has two additional doses of enoxaparin remaining. She will take these 2 injections.  Allergies: 1)  ! * Darvocet 2)  ! * Strawberries 3)  ! * Cats 4)  ! * Gin

## 2011-02-11 ENCOUNTER — Ambulatory Visit (INDEPENDENT_AMBULATORY_CARE_PROVIDER_SITE_OTHER): Payer: Medicare Other | Admitting: Pharmacist

## 2011-02-11 DIAGNOSIS — Z7901 Long term (current) use of anticoagulants: Secondary | ICD-10-CM

## 2011-02-11 DIAGNOSIS — I82409 Acute embolism and thrombosis of unspecified deep veins of unspecified lower extremity: Secondary | ICD-10-CM

## 2011-02-11 LAB — POCT INR: INR: 4

## 2011-02-11 MED ORDER — WARFARIN SODIUM 7.5 MG PO TABS: ORAL_TABLET | ORAL | Status: DC

## 2011-02-11 NOTE — Progress Notes (Signed)
Reviewed patient's PT/INR results and agree with treatment plan.  

## 2011-02-11 NOTE — Patient Instructions (Signed)
Patient instructed to take medications as defined in the Anti-coagulation Track section of this encounter.  Patient instructed to take today's dose. (Patient had ALREADY taken today's dose--otherwise would have instructed to hold/omit) Will reduce commencing tomorrow. Patient verbalized understanding of these instructions.

## 2011-02-11 NOTE — Progress Notes (Signed)
Anti-Coagulation Progress Note  Alisha [name] is a 50 y.o. female who is currently on an anti-coagulation regimen.    RECENT RESULTS: Recent results are below, the most recent result is correlated with a dose of 48.75 mg. per week: Lab Results  Component Value Date   INR 4.0 02/11/2011   INR 2.6 01/28/2011   INR 3.2 01/24/2011    ANTI-COAG DOSE:   Latest dosing instructions   Total Sun Mon Tue Wed Thu Fri Sat   45 7.5 mg 7.5 mg 3.75 mg 7.5 mg 7.5 mg 3.75 mg 7.5 mg    (7.5 mg1) (7.5 mg1) (7.5 mg0.5) (7.5 mg1) (7.5 mg1) (7.5 mg0.5) (7.5 mg1)         ANTICOAG SUMMARY: Anticoagulation Episode Summary              Current INR goal 2.0-3.0 Next INR check 03/04/2011   INR from last check 4.0! (02/11/2011)     Weekly max dose (mg)  Target end date    Indications DVT, Long term current use of anticoagulant   INR check location  Preferred lab    Send INR reminders to [facility]   Comments        Provider Role Specialty Phone number   Levada Schilling [facility]  Internal Medicine [phone]        ANTICOAG TODAY: Anticoagulation Summary as of 02/11/2011              INR goal 2.0-3.0     Selected INR 4.0! (02/11/2011) Next INR check 03/04/2011   Weekly max dose (mg)  Target end date    Indications DVT, Long term current use of anticoagulant    Anticoagulation Episode Summary              INR check location  Preferred lab    Send INR reminders to [facility]   Comments        Provider Role Specialty Phone number   Levada Schilling [facility]  Internal Medicine [phone]        PATIENT INSTRUCTIONS: Patient Instructions  Patient instructed to take medications as defined in the Anti-coagulation Track section of this encounter.  Patient instructed to take today's dose. (Patient had ALREADY taken today's dose--otherwise would have instructed to hold/omit) Will reduce commencing tomorrow. Patient verbalized understanding of these instructions.        FOLLOW-UP Return in 3 weeks (on  03/04/2011) for Follow up INR. Patient was given refill to [facility] [place] for warfarin 7.5mg  #30 Refill x 2 Take as directed.  Hulen Luster, III Pharm.D., CACP

## 2011-02-25 ENCOUNTER — Ambulatory Visit (HOSPITAL_COMMUNITY)
Admission: RE | Admit: 2011-02-25 | Discharge: 2011-02-25 | Disposition: A | Discharge status: Home / Self Care | Payer: Medicare Other | Source: Ambulatory Visit | Attending: Internal Medicine | Admitting: Internal Medicine

## 2011-02-25 ENCOUNTER — Ambulatory Visit: Payer: Medicare Other | Admitting: *Deleted

## 2011-02-25 ENCOUNTER — Ambulatory Visit (INDEPENDENT_AMBULATORY_CARE_PROVIDER_SITE_OTHER): Payer: Medicare Other | Admitting: Pharmacist

## 2011-02-25 DIAGNOSIS — Z79899 Other long term (current) drug therapy: Secondary | ICD-10-CM

## 2011-02-25 DIAGNOSIS — I82409 Acute embolism and thrombosis of unspecified deep veins of unspecified lower extremity: Secondary | ICD-10-CM

## 2011-02-25 DIAGNOSIS — Z7901 Long term (current) use of anticoagulants: Secondary | ICD-10-CM

## 2011-02-25 NOTE — Patient Instructions (Signed)
Patient instructed to take medications as defined in the Anti-coagulation Track section of this encounter.  Patient instructed to take today's dose.  Patient verbalized understanding of these instructions.    

## 2011-02-25 NOTE — Progress Notes (Signed)
Anti-Coagulation Progress Note  Alisha [name] is a 50 y.o. female who is currently on an anti-coagulation regimen.    RECENT RESULTS: Recent results are below, the most recent result is correlated with a dose of 45 mg. per week: Lab Results  Component Value Date   INR 3.5 02/25/2011   INR 4.0 02/11/2011   INR 2.6 01/28/2011    ANTI-COAG DOSE:   Latest dosing instructions   Total Sun Mon Tue Wed Thu Fri Sat   41.25 7.5 mg 3.75 mg 3.75 mg 7.5 mg 7.5 mg 3.75 mg 7.5 mg    (7.5 mg1) (7.5 mg0.5) (7.5 mg0.5) (7.5 mg1) (7.5 mg1) (7.5 mg0.5) (7.5 mg1)         ANTICOAG SUMMARY: Anticoagulation Episode Summary              Current INR goal 2.0-3.0 Next INR check 03/25/2011   INR from last check 3.5! (02/25/2011)     Weekly max dose (mg)  Target end date    Indications DVT, Long term current use of anticoagulant   INR check location [facility] Preferred lab    Send INR reminders to [facility]   Comments        Provider Role Specialty Phone number   Levada Schilling [facility]  Internal Medicine [phone]        ANTICOAG TODAY: Anticoagulation Summary as of 02/25/2011              INR goal 2.0-3.0     Selected INR 3.5! (02/25/2011) Next INR check 03/25/2011   Weekly max dose (mg)  Target end date    Indications DVT, Long term current use of anticoagulant    Anticoagulation Episode Summary              INR check location [facility] Preferred lab    Send INR reminders to [facility]   Comments        Provider Role Specialty Phone number   Levada Schilling [facility]  Internal Medicine [phone]        PATIENT INSTRUCTIONS: Patient Instructions  Patient instructed to take medications as defined in the Anti-coagulation Track section of this encounter.  Patient instructed to take today's dose.  Patient verbalized understanding of these instructions.        FOLLOW-UP Return in about 4 weeks (around 03/25/2011) for Follow up INR.  Hulen Luster, III Pharm.D., CACP

## 2011-03-04 ENCOUNTER — Ambulatory Visit: Payer: Medicare Other

## 2011-03-19 LAB — PROTIME-INR
INR: 1.96 — ABNORMAL HIGH (ref 0.00–1.49)
Prothrombin Time: 22.2 seconds — ABNORMAL HIGH (ref 11.6–15.2)

## 2011-03-20 LAB — CBC
HCT: 40.3 % (ref 36.0–46.0)
Hemoglobin: 11.6 g/dL — ABNORMAL LOW (ref 12.0–15.0)
Hemoglobin: 13.7 g/dL (ref 12.0–15.0)
MCHC: 34.1 g/dL (ref 30.0–36.0)
MCHC: 34.1 g/dL (ref 30.0–36.0)
MCV: 99.7 fL (ref 78.0–100.0)
Platelets: 177 10*3/uL (ref 150–400)
RBC: 3.44 MIL/uL — ABNORMAL LOW (ref 3.87–5.11)
RBC: 4.04 MIL/uL (ref 3.87–5.11)
RDW: 13.5 % (ref 11.5–15.5)
RDW: 14 % (ref 11.5–15.5)
WBC: 7.1 10*3/uL (ref 4.0–10.5)

## 2011-03-20 LAB — BASIC METABOLIC PANEL
BUN: 10 mg/dL (ref 6–23)
Calcium: 8.3 mg/dL — ABNORMAL LOW (ref 8.4–10.5)
GFR calc non Af Amer: >60 mL/min (ref >60)
Glucose, Bld: 125 mg/dL — ABNORMAL HIGH (ref 70–99)
Sodium: 138 mEq/L (ref 135–145)

## 2011-03-20 LAB — HOMOCYSTEINE: Homocysteine: 6.2 umol/L (ref 4.0–15.4)

## 2011-03-20 LAB — FACTOR 5 LEIDEN

## 2011-03-20 LAB — COMPREHENSIVE METABOLIC PANEL
ALT: 18 U/L (ref 0–35)
BUN: 5 mg/dL — ABNORMAL LOW (ref 6–23)
CO2: 26 mEq/L (ref 19–32)
Calcium: 9.3 mg/dL (ref 8.4–10.5)
Creatinine, Ser: 0.67 mg/dL (ref 0.4–1.2)
GFR calc non Af Amer: >60 mL/min (ref >60)
Glucose, Bld: 105 mg/dL — ABNORMAL HIGH (ref 70–99)
Total Protein: 7.5 g/dL (ref 6.0–8.3)

## 2011-03-20 LAB — PROTEIN C ACTIVITY: Protein C Activity: 86 % (ref 75–133)

## 2011-03-20 LAB — CARDIOLIPIN ANTIBODIES, IGG, IGM, IGA: Anticardiolipin IgA: <3 APL U/mL — ABNORMAL LOW (ref <10)

## 2011-03-20 LAB — LUPUS ANTICOAGULANT PANEL
Lupus Anticoagulant: NOT DETECTED
PTT Lupus Anticoagulant: 40.4 secs (ref 32.0–43.4)

## 2011-03-20 LAB — URINE DRUGS OF ABUSE SCREEN W ALC, ROUTINE (REF LAB)
Benzodiazepines.: POSITIVE — AB
Cocaine Metabolites: NEGATIVE
Opiate Screen, Urine: POSITIVE — AB
Phencyclidine (PCP): NEGATIVE

## 2011-03-20 LAB — PROTIME-INR
INR: 1.03 (ref 0.00–1.49)
INR: 1.13 (ref 0.00–1.49)
INR: 1.94 — ABNORMAL HIGH (ref 0.00–1.49)
Prothrombin Time: 13.4 seconds (ref 11.6–15.2)
Prothrombin Time: 22 seconds — ABNORMAL HIGH (ref 11.6–15.2)

## 2011-03-20 LAB — PROTEIN S ACTIVITY: Protein S Activity: 37 % — ABNORMAL LOW (ref 69–129)

## 2011-03-20 LAB — BETA-2-GLYCOPROTEIN I ABS, IGG/M/A: Beta-2 Glyco I IgG: <3 U/mL (ref <=15)

## 2011-03-20 LAB — OPIATE, QUANTITATIVE, URINE
Hydrocodone: 339 NG/ML — ABNORMAL HIGH
Oxycodone, ur: NEGATIVE NG/ML
Oxymorphone: NEGATIVE NG/ML

## 2011-03-20 LAB — CARDIAC PANEL(CRET KIN+CKTOT+MB+TROPI): Total CK: 135 U/L (ref 7–177)

## 2011-03-20 LAB — APTT: aPTT: 32 seconds (ref 24–37)

## 2011-03-20 LAB — BENZODIAZEPINE, QUANTITATIVE, URINE: Alprazolam (GC/LC/MS), ur confirm: NEGATIVE NG/ML

## 2011-03-20 LAB — PROTHROMBIN GENE MUTATION

## 2011-03-21 ENCOUNTER — Encounter: Payer: Self-pay | Admitting: Internal Medicine

## 2011-03-21 ENCOUNTER — Ambulatory Visit (INDEPENDENT_AMBULATORY_CARE_PROVIDER_SITE_OTHER): Payer: Medicare Other | Admitting: Internal Medicine

## 2011-03-21 DIAGNOSIS — R232 Flushing: Secondary | ICD-10-CM

## 2011-03-21 DIAGNOSIS — N951 Menopausal and female climacteric states: Secondary | ICD-10-CM

## 2011-03-21 DIAGNOSIS — G894 Chronic pain syndrome: Secondary | ICD-10-CM

## 2011-03-21 DIAGNOSIS — I10 Essential (primary) hypertension: Secondary | ICD-10-CM

## 2011-03-21 DIAGNOSIS — J019 Acute sinusitis, unspecified: Secondary | ICD-10-CM

## 2011-03-21 DIAGNOSIS — F329 Major depressive disorder, single episode, unspecified: Secondary | ICD-10-CM

## 2011-03-21 DIAGNOSIS — I82409 Acute embolism and thrombosis of unspecified deep veins of unspecified lower extremity: Secondary | ICD-10-CM

## 2011-03-21 MED ORDER — BENZONATATE 100 MG PO CAPS: 100.0000 mg | ORAL_CAPSULE | Freq: Four times a day (QID) | ORAL | Status: AC | PRN

## 2011-03-21 MED ORDER — DOXYCYCLINE HYCLATE 100 MG PO TABS: 100.0000 mg | ORAL_TABLET | Freq: Two times a day (BID) | ORAL | Status: AC

## 2011-03-21 MED ORDER — OXYCODONE-ACETAMINOPHEN 5-325 MG PO TABS: 1.0000 | ORAL_TABLET | Freq: Four times a day (QID) | ORAL | Status: DC | PRN

## 2011-03-21 MED ORDER — ALPRAZOLAM 0.5 MG PO TABS: 0.5000 mg | ORAL_TABLET | Freq: Three times a day (TID) | ORAL | Status: DC | PRN

## 2011-03-21 MED ORDER — ESCITALOPRAM OXALATE 20 MG PO TABS: 20.0000 mg | ORAL_TABLET | Freq: Every day | ORAL | Status: DC

## 2011-03-21 NOTE — Assessment & Plan Note (Signed)
Patient will be started on doxycyline due to penicillin allergy and tessalon for cough. Will follow up.

## 2011-03-21 NOTE — Assessment & Plan Note (Signed)
Changed cymbalta to lexapro as may help with GAD and hot flashes. Will follow up.

## 2011-03-21 NOTE — Assessment & Plan Note (Signed)
At goal. Continue current regimen. 

## 2011-03-21 NOTE — Patient Instructions (Signed)
Make a follow up appointment in one month with PCP. Take all medication as directed.

## 2011-03-21 NOTE — Assessment & Plan Note (Signed)
Patient very symptomatic. Will start patient on Lexapro and reassess on follow up.

## 2011-03-21 NOTE — Assessment & Plan Note (Signed)
Stable  Continue current regimen  

## 2011-03-21 NOTE — Assessment & Plan Note (Signed)
On coumadin. Patient to follow up with Dr. Michaell Cowing next week.

## 2011-03-21 NOTE — Progress Notes (Signed)
  Subjective:    Patient ID: Alisha [name], female    DOB: [birth year], 50 y.o.   MRN: [id number]  HPI  50 yr old woman with  Past Medical History  Diagnosis Date  . DVT (deep venous thrombosis)     bilateral, 2 episodes: Requires lifelong therapy  . Chronic pain syndrome   . Restless leg syndrome   . Depression   . Anxiety   . Hyperlipidemia   . Hypertension   . PVD (peripheral vascular disease)     s/p L fem-pop bypass 11/21/08; graft occluded 12/28/08;  aortogram w/ bilat LE runoff: 1.  bilat diffuse SFA occlusive dz, 2.  Mod to severe above-knee popliteal dz,  3.  Bilat 3-vessel runoff w/ mild tibial occlusive dz.  . Tobacco abuse   . Degenerative joint disease of knee   . Breast lump 02/2008    Biopsy 05/2008: showed no evidence of malignancy  . Irregular menses 9/08   comes to the clinic complaining of productive yellow-greenish cough for the last month. Reports to be a little better. Associated with congestion, yellowish nasal drainage, frontal headaches. Patient has been taken mucinex for the last two weeks which has helped cough .  Denies myalgia, fever/chills, sick contacts, sinus pressure.   Patient reports to have hot flashes for the last year. Worse at nightime, and wakes her up at night.  DVT: Taking coumadin. Sees Dr. Alexandria Lodge for INR check. Patient will return for INR check next week.    Review of Systems  [all other systems reviewed and are negative       Objective:   Physical Exam  Constitutional: She is oriented to person, place, and time. She appears well-developed and well-nourished. No distress.  HENT:  Mouth/Throat: Oropharynx is clear and moist. No oropharyngeal exudate.       Sinus pressure. Post nasal drip noted  Neck: Normal range of motion. Neck supple.  Cardiovascular: Normal rate, regular rhythm and normal heart sounds.   Pulmonary/Chest: Effort normal and breath sounds normal.  Abdominal: Soft. Bowel sounds are normal.  Musculoskeletal:   Limited range of motion due to pain, no erythema, no swelling, no increased warmth noted  Neurological: She is alert and oriented to person, place, and time.  Skin: No rash noted. No erythema.       Scar noted left leg. Trace lower extremity swelling  Psychiatric:       Anxious appearing          Assessment & Plan:

## 2011-03-25 ENCOUNTER — Ambulatory Visit: Payer: Medicare Other

## 2011-03-26 ENCOUNTER — Ambulatory Visit: Payer: Self-pay | Admitting: Internal Medicine

## 2011-04-02 LAB — DIFFERENTIAL
Basophils Relative: 1 % (ref 0–1)
Lymphocytes Relative: 36 % (ref 12–46)
Monocytes Absolute: 0.4 10*3/uL (ref 0.1–1.0)
Monocytes Relative: 5 % (ref 3–12)
Neutro Abs: 4.8 10*3/uL (ref 1.7–7.7)

## 2011-04-02 LAB — CBC
HCT: 43.1 % (ref 36.0–46.0)
Hemoglobin: 14.3 g/dL (ref 12.0–15.0)
MCHC: 33.2 g/dL (ref 30.0–36.0)
RBC: 4.35 MIL/uL (ref 3.87–5.11)

## 2011-04-02 LAB — BASIC METABOLIC PANEL
CO2: 24 mEq/L (ref 19–32)
Calcium: 9.1 mg/dL (ref 8.4–10.5)
GFR calc Af Amer: >60 mL/min (ref >60)
Potassium: 3.5 mEq/L (ref 3.5–5.1)
Sodium: 136 mEq/L (ref 135–145)

## 2011-04-02 LAB — RAPID URINE DRUG SCREEN, HOSP PERFORMED
Barbiturates: NOT DETECTED
Opiates: POSITIVE — AB
Tetrahydrocannabinol: NOT DETECTED

## 2011-04-09 ENCOUNTER — Ambulatory Visit: Payer: Medicare Other | Admitting: Licensed Clinical Social Worker

## 2011-04-09 ENCOUNTER — Other Ambulatory Visit: Payer: Self-pay | Admitting: Licensed Clinical Social Worker

## 2011-04-09 ENCOUNTER — Ambulatory Visit (INDEPENDENT_AMBULATORY_CARE_PROVIDER_SITE_OTHER): Payer: Medicare Other | Admitting: Internal Medicine

## 2011-04-09 ENCOUNTER — Other Ambulatory Visit (HOSPITAL_COMMUNITY)
Admission: RE | Admit: 2011-04-09 | Discharge: 2011-04-09 | Disposition: A | Discharge status: Home / Self Care | Payer: Medicare Other | Source: Ambulatory Visit | Attending: Internal Medicine | Admitting: Internal Medicine

## 2011-04-09 VITALS — BP 148/97 | HR 76 | Temp 98.0°F | Wt 251.0 lb

## 2011-04-09 DIAGNOSIS — G8929 Other chronic pain: Secondary | ICD-10-CM

## 2011-04-09 DIAGNOSIS — Z01419 Encounter for gynecological examination (general) (routine) without abnormal findings: Secondary | ICD-10-CM | POA: Insufficient documentation

## 2011-04-09 DIAGNOSIS — M25561 Pain in right knee: Secondary | ICD-10-CM

## 2011-04-09 DIAGNOSIS — R2681 Unsteadiness on feet: Secondary | ICD-10-CM

## 2011-04-09 DIAGNOSIS — F329 Major depressive disorder, single episode, unspecified: Secondary | ICD-10-CM

## 2011-04-09 DIAGNOSIS — G894 Chronic pain syndrome: Secondary | ICD-10-CM

## 2011-04-09 DIAGNOSIS — Z124 Encounter for screening for malignant neoplasm of cervix: Secondary | ICD-10-CM | POA: Insufficient documentation

## 2011-04-09 DIAGNOSIS — I739 Peripheral vascular disease, unspecified: Secondary | ICD-10-CM

## 2011-04-09 DIAGNOSIS — M79609 Pain in unspecified limb: Secondary | ICD-10-CM

## 2011-04-09 DIAGNOSIS — I82409 Acute embolism and thrombosis of unspecified deep veins of unspecified lower extremity: Secondary | ICD-10-CM

## 2011-04-09 DIAGNOSIS — I10 Essential (primary) hypertension: Secondary | ICD-10-CM

## 2011-04-09 DIAGNOSIS — J019 Acute sinusitis, unspecified: Secondary | ICD-10-CM

## 2011-04-09 MED ORDER — TRAMADOL HCL 50 MG PO TABS: 50.0000 mg | ORAL_TABLET | Freq: Four times a day (QID) | ORAL | Status: DC | PRN

## 2011-04-09 NOTE — Progress Notes (Signed)
30 minutes. Met with Alisha [name] and her daughter due to a referral from [place] reporting need for inhome aide and home health.  Consult with Dr. Coralee Pesa indicated that she had some recent falls at home and home safety evaluation would be helpful.   Discussed the services with Alisha [name] who has many chronic health issues to include bilateral knee pain, peripheral neuropathy, and gait instability.  She is amenable to using [facility] for home safety evaluation and will pick her provider for inhome aide once the state nurse comes out to assess. She is still at the [facility] address and best phone number is [phone].    SW will coordinate referrals and follow.

## 2011-04-09 NOTE — Assessment & Plan Note (Signed)
Pap smear done today we'll a patient known results return. We have also scheduled her for a mammogram.

## 2011-04-09 NOTE — Assessment & Plan Note (Signed)
Unfortunately Alisha [name] was mistakenly given him a prescription for Percocet. We talked about how she could taper off again. After talking to her she didn't want a prescription for tramadol and this was called in to Theodoro Clock, her mental health provider to tell her what happened here. I have asked Marcelino Duster to follow up in one month to see how she's doing.

## 2011-04-09 NOTE — Assessment & Plan Note (Signed)
Blood pressure was up today the patient was also quite upset about her medications we'll follow this up on next visit.

## 2011-04-09 NOTE — Assessment & Plan Note (Signed)
This has resolved from last visit.

## 2011-04-09 NOTE — Progress Notes (Signed)
  Subjective:    Patient ID: Alisha [name], female    DOB: 1961/03/10, 50 y.o.   MRN: [id number]  HPI Alisha [name] comes in today for followup and also for health maintenance exam. She was admitted since I've last seen her in the office for bilateral DVT DVT and is back on Coumadin being followed by Dr. gross in the Coumadin clinic.  As per my noted dated 02/06/2011, patient's mental health provider Theodoro Clock, called me to let me know that Alisha [name] had dilated her medication contract. Patient had been getting benzodiazepines filled both places. Patient had lied to mental health telling them I had stopped feeling her benzodiazepines is back in November of 2011. In February called and spoke with the patient told her that I would not be him to get her on any narcotics or benzodiazepines any longer. Because it was February 8th she had tried to taper off of her medications. At that time I did call in Tramadol for pain. Unfortunately patient was seen by resident on 03/21/2011 for sinusitis and he failed to pay attention to her "FYI" and the fact that she had been told that she could have no further narcotics or benzodiazepines and he restarted her on Percocet and alprazolam. As well there she is followed for all of [facility] he also changed her Cymbalta to Lexapro. She comes in today again very upset after a told her that I cannot continue to refill her narcotics and benzodiazepines and this was a mistake made by the resident.  She does have questions about the clots in her leg and we did talk about the fact that being back on the Coumadin she should be fine.     Review of Systems patient denies chest pain or shortness of breath. She does have some pain in her legs which is chronic. Patient says she hasn't been using the exercise bike because it seems to make her legs hurt worse. She denies nausea vomiting or constipation.     Objective:   Physical Exam patient in her normal state  which is slightly anxious-appearing otherwise in no acute distress, lungs are clear to auscultation percussion, heart regular in rhythm, breast exam reveals no masses tenderness or discharge, pelvic exam reveals normal external genitalia normal-appearing vaginal vault endo an exocervical Pap smears are obtained bimanual exam without abnormality as is the rectal vaginal vault, stool is heme-negative.        Assessment & Plan:

## 2011-04-09 NOTE — Patient Instructions (Signed)
I will send in a prescription for tramadol for your pain. Please taper off of the percocet and xanax as we discussed. Take care of yourself.

## 2011-04-09 NOTE — Assessment & Plan Note (Signed)
This is followed by mental health they will probably want to change her back to Cymbalta which she was on previously.

## 2011-04-09 NOTE — Assessment & Plan Note (Signed)
Patient being followed greatly in the Coumadin clinic.

## 2011-04-10 ENCOUNTER — Encounter: Payer: Self-pay | Admitting: Internal Medicine

## 2011-04-15 ENCOUNTER — Telehealth: Payer: Self-pay | Admitting: Internal Medicine

## 2011-04-15 MED ORDER — NAPROXEN 500 MG TAB
500 mg | ORAL_TABLET | Freq: Two times a day (BID) | ORAL | Status: AC
Start: 2011-04-15 — End: 2011-05-15

## 2011-04-15 MED ORDER — PENICILLIN V-K 500 MG TAB
500 mg | ORAL_TABLET | Freq: Four times a day (QID) | ORAL | Status: DC
Start: 2011-04-15 — End: 2013-07-04

## 2011-04-15 MED ADMIN — HYDROcodone-acetaminophen (NORCO) 7.5-325 mg per tablet 1 Tab: ORAL | @ 22:00:00 | NDC 00406036662

## 2011-04-15 NOTE — ED Notes (Signed)
Dr. Stadler reviewed discharge instructions with the patient.  The patient verbalized understanding.

## 2011-04-15 NOTE — ED Provider Notes (Signed)
HPI Comments: Debbie Scott is a 50 y.o. female who presents to the ED c/o fractures and pain to multiple teeth x 2-3 days. Pt reports pain is severe and made worse with hot or cold and eating or drinking, and made better with nothing. Pt denies seeing a dentist.    She specifically denies any fevers, chills, nausea, vomiting, chest pain, shortness of breath, headache, rash, diarrhea, sweating or weight loss.    PCP: Pt denies having a PCP  PMHx: Pt denies pmhx  Family Hx: Significant for HTN  Social Hx: +tobacco use; +alcohol use; -drug use; unemployed    There are no other complaints, changes or physical findings at this time.   Written by Effie Berkshire, ED Scribe, dictated by Haze Justin, MD    The history is provided by the patient. No language interpreter was used.        No past medical history on file.     No past surgical history on file.      No family history on file.     History     Social History   ??? Marital Status: Single     Spouse Name: N/A     Number of Children: N/A   ??? Years of Education: N/A     Occupational History   ??? Not on file.     Social History Main Topics   ??? Smoking status: Current Everyday Smoker   ??? Smokeless tobacco: Not on file   ??? Alcohol Use: Yes   ??? Drug Use: No   ??? Sexually Active:      Other Topics Concern   ??? Not on file     Social History Narrative   ??? No narrative on file                  ALLERGIES: Review of patient's allergies indicates no known allergies.      Review of Systems   Constitutional: Negative.  Negative for fever, chills, activity change, appetite change, fatigue and unexpected weight change.   HENT: Positive for dental problem. Negative for hearing loss, congestion, rhinorrhea, sneezing, neck stiffness and voice change.    Eyes: Negative.  Negative for pain and visual disturbance.   Respiratory: Negative.  Negative for apnea, cough, choking, chest tightness and shortness of breath.     Cardiovascular: Negative.  Negative for chest pain and palpitations.   Gastrointestinal: Negative.  Negative for nausea, vomiting, abdominal pain, diarrhea, blood in stool and abdominal distention.   Genitourinary: Negative.  Negative for urgency, frequency, flank pain and difficulty urinating.   Musculoskeletal: Negative.  Negative for myalgias, back pain and arthralgias.   Skin: Negative.  Negative for color change and rash.   Neurological: Negative.  Negative for dizziness, seizures, syncope, speech difficulty, weakness, numbness and headaches.   Hematological: Negative.  Negative for adenopathy.   Psychiatric/Behavioral: Negative.  Negative for suicidal ideas, behavioral problems, dysphoric mood and agitation. The patient is not nervous/anxious.    [all other systems reviewed and are negative        Filed Vitals:    04/15/11 1633   BP: 121/76   Pulse: 89   Temp: 99 ??F (37.2 ??C)   Resp: 18   Height: 5\' 1"  (1.549 m)   Weight: 154 lb 12.2 oz (70.2 kg)   SpO2: 96%            Physical Exam   [nursing notereviewed.  Constitutional: She is oriented to person,  place, and time. She appears well-developed and well-nourished. No distress.   HENT:        Diffuse dental caries, no erythema or edema   Neurological: She is alert and oriented to person, place, and time.   Skin: Skin is warm and dry. She is not diaphoretic.   Psychiatric: She has a normal mood and affect. Her behavior is normal.   Written by Effie Berkshire, ED Scribe, dictated by Haze Justin, MD     MDM     Amount and/or Complexity of Data Reviewed:    Review and summarize past medical records:  Yes  Progress:   Patient progress:  [stable      Procedures    6:05 PM  I have just reevaluated the patient. I have reviewed Her vital signs and determined there is currently no worsening in their condition or physical exam.  Written by Effie Berkshire, ED Scribe, dictated by Haze Justin, MD    6:05 PM   Otilio Carpen Glendenning has been counseled regarding her diagnosis.  She verbally conveys understanding and agreement of the signs, symptoms, diagnosis, treatment and prognosis and additionally agrees to follow up as recommended with a dentist.  She also agrees with the care-plan and conveys that all of her questions have been answered.  I have also put together some discharge instructions for her that include: 1) educational information regarding their diagnosis, 2) how to care for their diagnosis at home, as well a 3) list of reasons why they would want to return to the ED prior to their follow-up appointment, should their condition change.    Written by Effie Berkshire, ED Scribe, dictated by Haze Justin, MD

## 2011-04-15 NOTE — ED Notes (Signed)
5:30 PM    DR. Stadler at bedside evaluating pt.

## 2011-04-16 NOTE — Telephone Encounter (Signed)
See phone note

## 2011-04-18 ENCOUNTER — Telehealth: Payer: Self-pay | Admitting: *Deleted

## 2011-04-18 NOTE — Telephone Encounter (Signed)
Call from [facility] Physical Therapist with [facility].    Called to ask for a verbal order to have a Research scientist (life sciences) Consult" To come to house and talk about community resources for pt.  # [phone]  Will you approve this?

## 2011-04-19 NOTE — Telephone Encounter (Signed)
PT, shanda called and given VO for consult per Dr Coralee Pesa

## 2011-04-19 NOTE — Telephone Encounter (Signed)
Yes

## 2011-04-23 ENCOUNTER — Other Ambulatory Visit (INDEPENDENT_AMBULATORY_CARE_PROVIDER_SITE_OTHER): Payer: Medicare Other | Admitting: Internal Medicine

## 2011-04-23 DIAGNOSIS — A599 Trichomoniasis, unspecified: Secondary | ICD-10-CM

## 2011-04-23 MED ORDER — METRONIDAZOLE 500 MG PO TABS: 500.0000 mg | ORAL_TABLET | Freq: Three times a day (TID) | ORAL | Status: DC

## 2011-04-23 MED ORDER — METRONIDAZOLE 500 MG PO TABS: ORAL_TABLET | ORAL | Status: DC

## 2011-04-23 NOTE — Telephone Encounter (Signed)
I am calling this in to treat trichomonas vaginitis seen on pap smear.

## 2011-04-25 NOTE — Progress Notes (Signed)
Pt contacted by triage nurse [name]) regarding pap results and rx.Alisha [name]

## 2011-04-26 ENCOUNTER — Telehealth: Payer: Self-pay | Admitting: *Deleted

## 2011-04-26 NOTE — Telephone Encounter (Signed)
[facility] calls to request a 3 in 1 commode seat and a transfer bench. Will you approve?

## 2011-04-29 NOTE — Telephone Encounter (Signed)
Called to homehealth

## 2011-04-29 NOTE — Telephone Encounter (Signed)
Yes

## 2011-05-07 ENCOUNTER — Ambulatory Visit (INDEPENDENT_AMBULATORY_CARE_PROVIDER_SITE_OTHER): Payer: Medicare Other | Admitting: Internal Medicine

## 2011-05-07 ENCOUNTER — Encounter: Payer: Self-pay | Admitting: Licensed Clinical Social Worker

## 2011-05-07 ENCOUNTER — Ambulatory Visit: Payer: Medicare Other | Admitting: Licensed Clinical Social Worker

## 2011-05-07 ENCOUNTER — Encounter: Payer: Self-pay | Admitting: Internal Medicine

## 2011-05-07 VITALS — BP 156/100 | HR 83 | Temp 97.0°F | Ht 64.5 in | Wt 253.2 lb

## 2011-05-07 DIAGNOSIS — G894 Chronic pain syndrome: Secondary | ICD-10-CM

## 2011-05-07 DIAGNOSIS — F329 Major depressive disorder, single episode, unspecified: Secondary | ICD-10-CM

## 2011-05-07 DIAGNOSIS — F17200 Nicotine dependence, unspecified, uncomplicated: Secondary | ICD-10-CM

## 2011-05-07 DIAGNOSIS — G8929 Other chronic pain: Secondary | ICD-10-CM

## 2011-05-07 DIAGNOSIS — R2681 Unsteadiness on feet: Secondary | ICD-10-CM

## 2011-05-07 NOTE — Assessment & Plan Note (Signed)
Encourage cessation. °

## 2011-05-07 NOTE — Patient Instructions (Addendum)
I will be happy to refer you to Sports medicine for your knee pain. Please follow up in the clinic in 3 months.

## 2011-05-07 NOTE — Assessment & Plan Note (Signed)
Unfortunately patient is hurting him the fact that we will not give her narcotic medication therefore she feels better not taking care of her. As I told her at length today she violated the pain contract 4 separate times and therefore is obviously not a candidate for narcotics. As well she has been dishonest though to her mental health provider and myself. I have offered to continue the tramadol which she has been on. As well as offered to send her to sports medicine for further evaluation and treatment of her knee pain. Possibly an injection could be helpful for treating the pain as well. Greater than 40 minutes was spent with greater than 50% spent face-to-face with counseling and coordination of care discussing patient's chronic pain her complicated history, the fact that she is violated her pain contract, and all of the different possibilities for further treatment. As well I've gotten Nilda Riggs, social worker, involved to get her out more help in her home and further physical and occupational treatment at home

## 2011-05-07 NOTE — Assessment & Plan Note (Signed)
Patient's depression and personality disorder followed by mental health she says she Next has an appointment on May 18 and will keep that.

## 2011-05-07 NOTE — Progress Notes (Signed)
  Subjective:    Patient ID: Alisha [name], female    DOB: [birth year], 50 y.o.   MRN: [id number]  HPI 50 year old who comes in in tears and very angry once again because I will not give her narcotics. She goes off on many tangents saying that I am not taking care of her and I don't care about her pain. It I did go through with her multiple times today about the fact that she violated her pain contract 4 separate times. I told patient multiple times that I would continue giving her tramadol for pain, I would send her to sports medicine for her knee pain, or she was happy to establish with a new physician in town and we would send records. She said why when we continue taking care of her and I said that we would simply could not give her any further narcotics.  As well patient says that she is depressed and I told her that she needs to followup with her mental health provider. Patient also has lot of both her mental health provider and myself about 2 was supposed to be prescribing her benzodiazepines. Patient also denies these facts.    Review of Systems positive as per history of present illness otherwise denies chest pain or shortness of breath.     Objective:   Physical Exam distraught tearful female. At times angry at times tearful. In no acute respiratory distress. Lungs are clear. Heart regular rate and rhythm. Extremities without pitting edema no warmth in the calves.        Assessment & Plan:

## 2011-05-08 NOTE — Progress Notes (Signed)
Referred by Dr. Coralee Pesa to assist patient with home health issues and necessary paperwork.   Met Alisha [name] in the exam room and find that she is tearful today and reporting that she only has two hours per day provided by inhome aide.  She said that the state said she had to get reassessed for additional hours.   Soc[employer] Work processed the paperwork this morning requesting additional hours of care based on her chronic pain and leg edema/unable to ambulate or stand for long periods.   Faxed in PM.  Patient was receptive to that option.    Additionally, home health face to face paperwork from [facility] was completed and faxed back.

## 2011-05-14 NOTE — Op Note (Signed)
Alisha [name], Alisha [name]                 ACCOUNT NO.:  [id number]   MEDICAL RECORD NO.:  [id number]          PATIENT TYPE:  AMB   LOCATION:  SDS                          FACILITY:  [facility]   PHYSICIAN:  Thomas A. Cornett, M.D.DATE OF BIRTH:  [birth year]   DATE OF PROCEDURE:  06/13/2008  DATE OF DISCHARGE:                               OPERATIVE REPORT   PREOPERATIVE DIAGNOSIS:  Right breast mass.   POSTOPERATIVE DIAGNOSIS:  Right breast mass.   PROCEDURE:  Right breast needle-localized lumpectomy.   SURGEON:  Maisie Fus A. Cornett, MD   ANESTHESIA:  LMA with 0.25% Sensorcaine local with epinephrine.   ESTIMATED BLOOD LOSS:  20 mL.   SPECIMEN:  Right breast tissue localizing wire clip verified by  radiograph to Pathology.   INDICATIONS FOR PROCEDURE:  The patient is a 50 year old female with a  painful right breast mass.  Core biopsy showed a fibroadenoma that was  giving her discomfort, and she wished to have it removed based on pain.  She presents today for needle-localized excisional lumpectomy.   DESCRIPTION OF PROCEDURE:  The patient was brought to the operating room  after undergoing right breast needle localization.  After induction of  the LMA anesthesia, the right breast was prepped and draped in sterile  fashion.  The wire was trimmed.  Sensorcaine 0.25% was infiltrated  around the wire and skin.  A periareolar incision was made.  The wire  was brought out through the incision.  This was at about 9 o'clock in  the right breast.  The tissue around the entire wire and clip were  excised and sent to Pathology.  Radiographs revealed specimen to be  adequate.  Hemostasis was achieved.  The wound was closed in layers with  a deep layer of 3-0 Vicryl and 4-0 Monocryl subcuticular stitch.  Dermabond was applied.  All final counts of sponge, needle, and  instruments were found to be correct at this portion of case.  The  patient was subsequently awoken and taken to recovery room in  satisfactory condition.      Thomas A. Cornett, M.D.  Electronically Signed     TAC/MEDQ  D:  06/13/2008  T:  06/14/2008  Job:  [id number]   cc:   Carylon Perches, MD

## 2011-05-14 NOTE — Assessment & Plan Note (Signed)
OFFICE VISIT   Alisha [name], Alisha [name]  DOB:  07-23-1961                                       11/15/2010  ZOXWR#:[id number]   The patient was [name] no-show again for her appointment.  She was also [name] no-  show for her appointment last week.  The patient has not been scheduled  for further appointment.  She will call if she wishes to reschedule at  some point in the future.     Janetta Hora. Fields, MD  Electronically Signed   CEF/MEDQ  D:  11/15/2010  T:  11/15/2010  Job:  [phone]

## 2011-05-14 NOTE — Assessment & Plan Note (Signed)
OFFICE VISIT   ATIA, HAUPT A  DOB:  05-30-1961                                       04/19/2009  EAVWU#:[id number]   The patient returns for followup today.  She previously underwent a left  common femoral endarterectomy with profundoplasty and left femoral to  below knee popliteal bypass with vein.  This was done in November 2009.  She developed infection in these wounds and she had a fairly early  occlusion of her fem-pop bypass.  Since then, she has  still complained  of some symptoms of claudication in her left leg.  However, in the  meantime she has been able to quit smoking.  She denies any rest pain.  Her wounds have healed completely at this point.  She currently  describes that she develops a cramping sensation in her left calf which  occurs after walking approximately 10 minutes.  She has also been seeing  Dr. Valetta Close for pain in her knees and is being evaluated for  this.   PHYSICAL EXAMINATION:  Blood pressure 143/92 in the left arm, pulse is  80 and regular.  Lower extremities:  She has 2+ femoral pulses  bilaterally.  Leg incisions on the left leg are well-healed.  She has  absent popliteal and pedal pulses bilaterally.  She had bilateral ABIs  performed today which were 0.74 on the right and 0.93 on the left.  These are both improved from her previous ABIs.   I discussed with this patient today that I do not believe we should  entertain any further interventions currently due to the fact that she  had a very short time span of her bypass before occluded and currently  she has fairly minimal symptoms and has very good perfusion of her left  leg.  I believe the best option for her right now is currently  conservative in nature with risk factor modification.  I congratulated  her on her smoking cessation and this should help her significantly.  If  she develops rest pain or has an open ulceration or open wound on her  left foot, we  would consider revascularization in the future if  indicated.  Otherwise will continue to manage her conservatively for  now.  She will follow up with me in six months' time for repeat ABIs.   Janetta Hora. Fields, MD  Electronically Signed   CEF/MEDQ  D:  04/19/2009  T:  04/20/2009  Job:  [id number]

## 2011-05-14 NOTE — Assessment & Plan Note (Signed)
OFFICE VISIT   Alisha [name], Alisha [name]  DOB:  [birth year]                                       10/26/2008  EAVWU#:[id number]   The patient returns for followup today.  She was last seen on September  30 for an evaluation of lower extremity claudication.  She had [name] CT  angiogram performed and then was [name] no-show for her appointment last  week.  She presents today still with the same complaints of primarily  left lower extremity claudication.  She also has some problems with her  right leg but her left leg is much worse than her right.  The pain  occurs in the calf after approximately 1 block.  She also has some  occasional pain in her left foot with numbness and tingling at night  time.  Her atherosclerotic risk factors include hypertension, elevated  cholesterol and smoking.  She has [name] few cigarettes per week at this  point.   PAST MEDICAL HISTORY:  She had pulmonary edema in the past.   PAST SURGICAL HISTORY:  She had bilateral tubal ligation, [name] breast  lumpectomy for benign disease and tonsillectomy.   MEDICATIONS:  1. Aspirin 325 mg once daily.  2. Metoprolol 50 mg twice [name] day.  3. Lisinopril 40 mg once [name] day.  4. Lorazepam 0.5 mg every 6 hours as needed for anxiety.  5. Hydrochlorothiazide 25 mg once [name] day.  6. Lipitor 20 mg once [name] day.  7. Carvedilol 6.25 mg twice [name] day.  8. Fluoxetine 20 mg once [name] day.  9. Percocet as needed for pain.   ALLERGIES:  She is allergic to Darvocet.   SOCIAL HISTORY:  She is single.  She is unemployed.  She has four  children.  Smoking history is as above.  She occasionally drinks [name] glass  of red wine.  She states that she is essentially unable to work because  of her debilitating claudication symptoms.   PHYSICAL EXAMINATION:  Vital signs:  On physical exam blood pressure is  109/74 in the left arm, pulse is 85 and regular.  HEENT:  Unremarkable.  Neck:  Has 2+ carotid pulses without bruit.  Chest:  Clear to  auscultation.  Cardiac:  Regular rate and rhythm without murmur.  Abdomen:  Obese, soft, nontender, nondistended with no masses.  Extremities:  She has difficult to palpate femoral pulses.  She has [name] 1+  right femoral pulse.  She has absent popliteal and pedal pulses  bilaterally.  Feet are pink, warm and well-perfused.   I reviewed her CT angiogram which shows no significant aortic or common  iliac artery stenosis.  She did have [name] 50% right external iliac artery  stenosis.  She has [name] 40% right common femoral artery stenosis.  She has  occlusion of the superficial femoral artery bilaterally.  She also has [name]  50% left common femoral artery stenosis.   The patient is fairly debilitated overall by her symptoms.  Her left leg  is currently the worst.  She does have some common femoral disease on  the left side.  She also has [name] left superficial femoral artery  occlusion.  I believe because of her debilitating symptoms she would  benefit from [name] left femoral endarterectomy with left femoral popliteal  bypass.  I am unable to determine from the CT angio alone whether  or not  she would be [name] candidate for an above knee or below knee popliteal  bypass.  In light of this we have scheduled her for an aortogram with  lower extremity runoff on November 6.  We will then consider planning [name]  left femoral popliteal bypass after that.  She will then decide  subsequently whether or not she wishes to have [name] right femoral popliteal  bypass.  We had [name] lengthy discussion today that with smoking cessation  alone her risk of amputation lifetime would be 1% or less.  I did inform  her that if we began arterial interventions on her the risk of  amputation long-term could be higher.  This could also significantly be  higher if she continues to smoke.  She understands and agrees to  proceed.  I will see her again on November 6 for arteriogram.  Risks,  benefits, possible complications and procedure details of  arteriography  were explained to the patient including but not limited to bleeding,  infection, contrast reaction.  She understands and agrees to proceed.   Janetta Hora. Fields, MD  Electronically Signed   CEF/MEDQ  D:  10/27/2008  T:  10/27/2008  Job:  [id number]   cc:   Mariea Stable, MD

## 2011-05-14 NOTE — Assessment & Plan Note (Signed)
OFFICE VISIT   VENIA, RIVERON A  DOB:  Mar 12, 1961                                       09/28/2008  ZOXWR#:[id number]   The patient is a 50 year old female referred by Dr. Mariea Stable  from [facility] outpatient clinic.  The patient has a history of  bilateral lower extremity claudication.  She had an arteriogram done in  October of 2007 which showed bilateral superficial femoral artery  occlusions.  She currently has pain in the left leg which is greater  than her right leg.  She states that this pain occurs in the calf after  approximately one block.  She also has some occasional pain in her left  foot at night time.  Her atherosclerotic risk factors include  hypertension, elevated cholesterol and smoking.  She states she has cut  back on her tobacco consumption but still smokes about five cigarettes  per week.   PAST MEDICAL HISTORY:  She has also had pulmonary edema in the past.   PAST SURGICAL HISTORY:  She had bilateral tubal ligation, breast  lumpectomy for benign disease and tonsillectomy.   MEDICATIONS:  1. Aspirin 325 mg once a day.  2. Metoprolol 50 mg one twice a day.  3. Lisinopril 40 mg once a day.  4. Lorazepam 0.5 mg q.6 hours p.r.n.  5. Hydrochlorothiazide 25 mg once a day.  6. Lipitor 20 mg once a day.  7. Carvedilol 6.25 mg one twice a day.  8. Fluoxetine 20 mg once daily.  9. Percocet p.r.n.   ALLERGIES:  She is allergic to Darvocet.   SOCIAL HISTORY:  She is single.  She is unemployed.  She has four  children.  Smoking history as above.  She occasionally drinks a glass of  red wine.   REVIEW OF SYSTEMS:  CONSTITUTIONAL:  She has had some weight loss  recently.  She is currently 5 feet 5 inches, 205 pounds.  CARDIAC:  She has some occasional shortness of breath when lying flat.  PULMONARY:  She has a chronic productive cough.  GI, renal, hematologic review of systems are all negative.  VASCULAR:  She denies history of  stroke or TIA.  NEUROLOGIC:  She has some occasional dizziness.  ORTHOPEDIC:  She has multiple joint arthritis pain.  PSYCHIATRIC:  She has a history of anxiety.   PHYSICAL EXAMINATION:  Vital signs:  On physical exam blood pressure is  158/104, pulse is 77 and regular.  HEENT:  Is unremarkable.  She has 2+  carotid pulses without bruit.  Chest:  Clear to auscultation.  Cardiac:  Regular rate and rhythm without murmur.  Abdomen:  Is soft, nontender,  nondistended.  No masses.  Obese.  Lower extremities:  She has femoral  pulses which are difficult to palpate.  She previously had 1+ femoral  pulses 2 years ago.  She has absent popliteal and pedal pulses  bilaterally.  Feet are pink, warm and adequately perfused.   She had bilateral ABIs performed on September 30 which showed an ABI on  the right side of 0.6 and on the left side of 0.66.  These are actually  slightly improved from her ABIs in 2007 which were at that time 0.56 on  the left and 0.57 on the right.   Previous arteriogram in 2007 showed bilateral superficial femoral artery  occlusive disease  and moderate to severe above knee popliteal disease  with three vessel runoff bilaterally and mild tibial disease.   The patient's exam today is similar to previous although I do have more  difficulty feeling her femoral pulses today.  I believe the best test  would be a CT angiogram to see if her lower extremity occlusive disease  has changed significantly.  She states that she would like to consider a  bypass operation if anatomically possible.  I described to her today  that the risk of limb loss if she can quit smoking with no intervention  at all would be less than 5% lifetime.  She states, however, that her  legs hurt had enough that she would be willing to risk losing her leg  rather than stay in her current condition.  I will see her back next  Wednesday after her CT angiogram for further followup.   Janetta Hora. Fields, MD   Electronically Signed   CEF/MEDQ  D:  09/29/2008  T:  09/29/2008  Job:  [id number]   cc:   Mariea Stable, MD

## 2011-05-14 NOTE — Op Note (Signed)
Alisha [name], Alisha [name]                 ACCOUNT NO.:  [id number]   MEDICAL RECORD NO.:  [id number]          PATIENT TYPE:  AMB   LOCATION:  SDS                          FACILITY:  [facility]   PHYSICIAN:  Charles E. Fields, MD  DATE OF BIRTH:  [birth year]   DATE OF PROCEDURE:  11/04/2008  DATE OF DISCHARGE:  11/04/2008                               OPERATIVE REPORT   PROCEDURES:  1. Aortogram with bilateral lower extremity runoff.  2. Pressure gradient measurement with nitroglycerin induction, right      lower extremity.  3. Right external iliac artery stent.   PREOPERATIVE DIAGNOSIS:  Bilateral lower extremity claudication.   POSTOPERATIVE DIAGNOSIS:  Bilateral lower extremity claudication.   ANESTHESIA:  Local with IV sedation.   INDICATIONS:  The patient has a history of short distance bilateral  lower extremity claudication.  She presents today for further evaluation  of her lower extremity arterial circulation for preoperative planning  purposes.  Risks, benefits, possible complications, and procedure  details were explained to the patient preoperatively including but not  limited to bleeding, infection, contrast nephropathy, and arterial  injury.   OPERATIVE DETAILS:  After obtaining informed consent, the patient was  brought to the [facility] lab.  The patient was placed in supine position on the  angio table.  Both groins were prepped and draped in the usual sterile  fashion.  Local anesthesia was infiltrated over the right common femoral  artery.  Several attempts were made to cannulate the right common  femoral artery without success.  The artery was fairly deep and the  pulse fairly poor in quality.  Next, an ultrasound was brought up in the  operative field and this was used to identify the area of the right  common femoral vein.  This was then cannulated successfully under  fluoroscopic guidance.  A 0.035 Wholey wire was then advanced into the  abdominal aorta.  A 5-Jessalynn sheath  was then placed over the guidewire  in the right common femoral artery.  Sheath was thoroughly flushed with  heparinized saline.  A 5-Hema pigtail catheter was then placed over  the guidewire in the abdominal aorta.  An abdominal aortogram was  obtained.  This shows mild atherosclerotic changes of the distal  infrarenal abdominal aorta.  There is a 40% stenosis of the left mid  renal artery with some poststenotic dilatation.  The right renal artery  is patent.  The left and right common iliac arteries are patent.  The  proximal external iliac arteries are patent.  The right internal iliac  artery is patent.  There is diffuse narrowing of the distal left  internal iliac artery.  Next, the pigtail catheter was pulled down to  the lower abdominal aorta and a pelvic angiogram was obtained in AP and  30-degree RAO and LAO projection.  This shows that the external iliac  artery on the left side is patent.  The external iliac artery on the  right side has an area of approximately 70% stenosis, approximately 2 cm  distal to the takeoff of the external iliac artery.  There is some  diffuse narrowing of the proximal right common femoral artery and the  distal external iliac artery.  There is diffuse narrowing of the right  common femoral artery and the entire artery is approximately 50%  stenosed.  There are also similar findings in the left common femoral  artery.  The profunda femoris artery is patent bilaterally.  The  superficial femoral artery is occluded at its origin bilaterally.  Lower  extremity runoff views were then obtained.  This shows a patent  popliteal artery which reconstitutes via profunda collaterals below the  knee.  The popliteal artery is visualized above the knee, but this is  diffusely diseased.  In the left leg, there is 3-vessel runoff via the  anterior tibial, posterior tibial, and peroneal artery.  In the right  leg, the anterior tibial artery is patent at its origin,  however, it  occludes, fills very slowly, and appears to occlude at the level of the  ankle.  The peroneal artery opacifies completely, but is diffusely  diseased at the level of the ankle and gives off a very marginal  anterior and posterior communicating branches.  Primary runoff to the  right foot is via the posterior tibial artery.  At this point, the  pigtail catheter was pulled back over a guidewire and a 5-Mehr  straight catheter was placed over the guidewire up into the distal  abdominal aorta.  The patient was then given 200 mcg of nitroglycerin  through the right femoral sheath and this was thoroughly flushed.  Pressure gradient measurements were then measured by pulling the 5  straight catheter down through the right iliac system.  There was an  area at the previously noted 70% stenosis portion of the external iliac  artery where there was a 20-mm gradient drop off.  It was decided at  this point to intervene on this area.  The 0.035 Wholey wire was brought  back upon the operative field and advanced into the distal abdominal  aorta.  The 5-Bre sheath was then exchanged over a guidewire for a 6-  [place] long bright tip sheath.  This was then further thoroughly flushed  with heparinized saline.  Sheath was advanced up to the level of the  iliac bifurcation.  An 8 x 3 Protege nitinol stent was then brought up  in the operative field.  The patient was given 5000 units of intravenous  heparin.  After appropriate circulation time, the stent was introduced  up into the external iliac artery using roadmapping technique.  The  stent was then deployed in the proximal third of the right external  iliac artery and the introducer system then removed.  A retrograde  angiogram was performed to confirm that the stent was in the location of  the stenosis.  An 8 x 3 balloon was then brought up in the operative  field and placed within the stent.  This was then inflated to 6  atmospheres  for 1 minute.  The patient did experience some brief  discomfort during expansion of the balloon.  This quickly resolved.  Completion arteriogram was then obtained, which shows that the right  external iliac artery is widely patent with normal caliber throughout  its diameter.  There is no evidence of dissection.  Next, the guidewire  was pulled back and the 5-Caylea sheath left in place to be pulled in  the holding area if the patient's ACT was less than 175.  The patient  tolerated the procedure  well.  There were no complications.  The patient  was taken the holding area in stable condition.   OPERATIVE FINDINGS:  1. 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  2. Bilateral superficial femoral artery occlusions at the origin.  3. Bilateral reconstitution of below-knee popliteal artery via      profunda collaterals.  4. One-vessel runoff to the right foot via the posterior tibial      artery.  5. Three-vessel runoff to the left foot.      Janetta Hora. Fields, MD  Electronically Signed     CEF/MEDQ  D:  11/04/2008  T:  11/04/2008  Job:  [id number]

## 2011-05-14 NOTE — Assessment & Plan Note (Signed)
OFFICE VISIT   Alisha [name], Alisha [name] A  DOB:  12-29-1961                                       11/08/2010  WGNFA#:[id number]   The patient was a no show for her office visit today for evaluation of  peripheral arterial disease and left lower extremity pain.     Janetta Hora. Fields, MD  Electronically Signed   CEF/MEDQ  D:  11/08/2010  T:  11/09/2010  Job:  [id number]   cc:   Deatra Robinson, MD

## 2011-05-14 NOTE — Op Note (Signed)
NAMEPRACHI, Alisha [name]                 ACCOUNT NO.:  [id number]   MEDICAL RECORD NO.:  [id number]          PATIENT TYPE:  INP   LOCATION:  3305                         FACILITY:  [facility]   PHYSICIAN:  Janetta Hora. Fields, MD  DATE OF BIRTH:  1961/05/22   DATE OF PROCEDURE:  11/21/2008  DATE OF DISCHARGE:                               OPERATIVE REPORT   PROCEDURES:  1. Left common femoral endarterectomy with profundoplasty.  2. Left femoral to below-knee popliteal bypass with nonreversed      greater saphenous vein.  3. Intraoperative arteriogram x1.   PREOPERATIVE DIAGNOSIS:  Claudication of left lower extremity.   POSTOPERATIVE DIAGNOSIS:  Claudication of left lower extremity.   ANESTHESIA:  General.   ASSISTANT:  Wilmon Arms, PA-C   OPERATIVE FINDINGS:  1. A 3 to 3.5 mm left greater saphenous vein.  2. Three-vessel runoff to the foot.   OPERATIVE DETAILS:  After obtaining informed consent, the patient was  taken to the operating.  The patient was placed in supine position on  the operating table.  After induction of general anesthesia and  endotracheal intubation, a Foley catheter was placed.  Next, the  patient's entire left lower extremity was prepped and draped in usual  sterile fashion.  An oblique incision was made in the left groin carried  down through the subcutaneous tissues down to the level of left common  femoral artery.  There were some posterior plaque and up near the  inguinal ligament.  The plaque was essentially circumferential.  Common  femoral artery, profunda femoris, and superficial femoral arteries were  dissected free circumferentially.  Next, in the medial portion of  incision, the greater saphenous vein was dissected free  circumferentially at the level of the saphenofemoral junction.  The vein  was then dissected free through several skip incisions down the thigh  and small side branches were ligated and divided between silk ties and  clips.  An  incision was made below the knee on the left leg on the  medial aspect.  The saphenous vein was also harvested through this  location.  The vein was approximately 3 to 3.5 mm throughout its course.  Incision was deepened down into the left popliteal space.  The popliteal  artery was dissected free circumferentially.  This was slightly  thickened, but soft in character circumferentially.  Vessel loops were  placed proximal and distal to the planned site of arteriotomy.  Next,  the vein was harvested from just below the knee and doubly clipped.  The  saphenofemoral junction was then suture ligated with a 2-0 silk.  The  vein was gently distended and tested for hemostasis.  Vein was then  placed in heparinized saline solution.  Next, a tunnel was created  between the heads of the gastrocnemius muscle up to the groin  subsartorially.  The patient was then given 7000 units of intravenous  heparin.  The patient was also given an additional 3000 units of heparin  during the case.  Next, the distal external iliac artery was controlled  with a Cooley clamp.  Profunda femoris controlled with a baby Gregory  clamp.  Superficial femoral artery controlled with a vessel loop.  A  longitudinal opening was made in the common femoral artery just above  the femoral bifurcation.  This was extended up towards the external  iliac artery.  There was a large exophytic plaque in this location.  The  sided endarterectomy would need to be performed because of the luminal  narrowing of the proximal common femoral artery.  A suitable plane was  obtained and a good proximal endpoint was obtained using eversion  technique.  The endarterectomy was then carried down into the origin of  the profunda femoris artery.  The origin of the profunda was opened  slightly to obtain a good endpoint.  Superficial femoral arteries  endarterectomized by eversion technique.  Next, the vein was placed in a  nonreversed configuration and  spatulated.  The vein was then sewn end of  vein to side of artery using a running 5-0 Prolene suture.  Just prior  to completion of anastomosis, this was forebled, backbled, and  thoroughly flushed.  Anastomosis was secured.  Clamps were released.  There was good flow into the proximal portion of the vein graft and down  to the profunda femoris artery immediately.  Next, a valvulotome was  brought up in the operative field and all of the valves were completely  lysed.  There was good pulsatile flow through the vein graft at this  time.  The vein was then marked for orientation and brought through the  subsartorial tunnel.  The below-knee popliteal artery was then  controlled with proximal and distal vessel loops.  The leg was  straightened and an arteriotomy was made in the below-knee popliteal  artery and the vein cut to length and spatulated.  Vein was then sewn  end of vein to side of artery using a running 6-0 Prolene suture.  Just  prior to completion of anastomosis, this was forebled, backbled, and  thoroughly flushed.  Anastomosis was secured.  Clamps were released.  There was good pulsatile flow into the vein graft and there was good  Doppler flow in the dorsalis pedis and posterior tibial artery distally.  This augmented approximately 50% with clamping of the vein graft.  Next,  hemostasis was obtained.  All of the incisions were closed with multiple  layers of running 2-0 and 3-0 Vicryl suture.  Skin of the groin incision  was closed with a running 4-0 Vicryl subcuticular stitch.  The other  incisions were closed with staples.  Should also be noted that prior to  closure, intraoperative arteriogram was obtained by introducing a 21-  gauge butterfly needle into the proximal aspect of the vein graft.  This  was done with inflow occlusion and showed a patent distal anastomosis  with three-vessel runoff to the foot.  The butterfly needle was removed  and the vein graft repaired with  single 6-0 Prolene suture.  The patient  tolerate the procedure well and there were no complications.  Instrument, sponge, and needle count was correct at the end of the case.  The patient taken to recovery room in stable condition.      Janetta Hora. Fields, MD  Electronically Signed     CEF/MEDQ  D:  11/21/2008  T:  11/22/2008  Job:  [id number]

## 2011-05-14 NOTE — Procedures (Signed)
BYPASS GRAFT EVALUATION   INDICATION:  Follow-up evaluation of lower extremity bypass graft.   HISTORY:  Diabetes:  No.  Cardiac:  No.  Hypertension:  Yes.  Smoking:  Yes.  Previous Surgery:  Right external iliac artery stent on 11/04/2008.  Left fem-pop bypass graft on 11/21/2008.   SINGLE LEVEL ARTERIAL EXAM                               RIGHT              LEFT  Brachial:                    113                107  Anterior tibial:             60                 74  Posterior tibial:            65                 78  Peroneal:  Ankle/brachial index:        0.58               0.69   PREVIOUS ABI:  Date: 11/22/2008  RIGHT:  0.90  LEFT:  0.80   LOWER EXTREMITY BYPASS GRAFT DUPLEX EXAM:   DUPLEX:  Extensive nonechogenic structures seen along the greater  saphenous vein harvest site.  No flow is identified within the left fem-  pop bypass graft.   IMPRESSION:  1. Occluded left femoropopliteal bypass graft.  2. Ankle brachial indices are lower than previously recorded      bilaterally.  3. Limited study secondary to patient's body habitus, extensive pain      and swelling, and staples from recent surgery.   ___________________________________________  Janetta Hora Fields, MD   MC/MEDQ  D:  12/07/2008  T:  12/07/2008  Job:  [id number]

## 2011-05-14 NOTE — Discharge Summary (Signed)
[name], Alisha [name]                 ACCOUNT NO.:  [id number]   MEDICAL RECORD NO.:  [id number]          PATIENT TYPE:  INP   LOCATION:  2010                         FACILITY:  [facility]   PHYSICIAN:  Janetta Hora. Fields, MD  DATE OF BIRTH:  September 13, 1961   DATE OF ADMISSION:  11/21/2008  DATE OF DISCHARGE:  11/27/2008                               DISCHARGE SUMMARY   DISCHARGE DIAGNOSES:  1. Ischemic left lower extremity.  2. Dyslipidemia.  3. History of congestive heart failure.  4. Hypertension.   PROCEDURE PERFORMED:  1. Left common femoral endarterectomy.  2. Left femoral to below knee bypass grafting using ipsilateral      translocated non-reversed greater saphenous vein on November 21, 2008, by Dr. Darrick Penna.   COMPLICATIONS:  None.   DISCHARGE STATUS:  Stable, improving.   DISCHARGE MEDICATIONS:  She is instructed to resume all previous  medications consisting of,  1. Aspirin 81 mg p.o. daily.  2. Coreg 12.5 mg p.o. b.i.d.  3. Lisinopril 40 mg p.o. daily.  4. Xanax 0.5 mg b.i.d.  5. Klonopin 1 mg p.o. b.i.d.  6. Hydrochlorothiazide 25 mg p.o. daily.  7. Lipitor 20 mg p.o. daily.  8. Fluoxetine 40 mg p.o. daily.  9. Ambien 10 mg p.o. nightly.  10.Centrum vitamin.  11.Albuterol inhaler p.r.n.   She is given a prescription for Percocet 5/325 one p.o. q.4 h. p.r.n.  pain, total #60 were given.   DISPOSITION:  She is being discharged home in stable condition with her  wounds healing well.  She is given careful instructions regarding the  care of her wounds and her activity level.  She is given a return  appointment with Dr. Darrick Penna in 2 weeks with ABIs and staple removal and  follow up.  The office will make the arrangement.   BRIEF IDENTIFYING STATEMENT:  For complete details, please refer to  typed history and physical.  Briefly, this very pleasant 50 year old  woman was evaluated by Dr. Darrick Penna for left lower extremity claudication.  He thought that she should undergo  femoral to below knee popliteal  bypass grafting.  She was informed of the risks and benefits of the  procedure and after careful consideration, she elected to proceed with  surgery.   HOSPITAL COURSE:  Preoperative workup was completed as an outpatient.  She was brought in through same day surgery and underwent the  aforementioned revascularization procedure.  For complete details,  please refer the typed operative report.  The procedure was without  complications.  She was returned to the postanesthesia care unit,  extubated.  Following stabilization, she was transferred to a bed on a  surgical stepdown unit.  She was observed overnight and was able to be  transferred to a bed on a surgical convalescent floor.  Over the next  several days, her diet and activities were advanced as tolerated.  She  displayed continuing progress and on November 27, 2008, was desirous of  discharge.  She was subsequently discharged home in stable condition.      Wilmon Arms, PA  Janetta Hora. Fields, MD  Electronically Signed    KEL/MEDQ  D:  11/27/2008  T:  11/27/2008  Job:  [id number]   cc:   Janetta Hora. Darrick Penna, MD

## 2011-05-14 NOTE — Assessment & Plan Note (Signed)
OFFICE VISIT   Alisha [name], Alisha [name]  DOB:  08-24-1961                                       12/28/2008  ZOXWR#:[id number]   The patient returns for followup today.  She underwent left common  femoral endarterectomy with profundoplasty and left femoral to below  knee popliteal bypass with vein on November 23.  She was last seen in  the office on December 9.  It was found at that time that her graft was  occluded.  Her ABI on the left side was 0.69.  She had several wounds  which were in various stages of healing but had some separation of  several of these.  I am pleased to report today that all the incisions  are healing well.  She does not seem to have overall debilitating  claudication symptoms at this point but has not been doing [name] large  amount of walking secondary to healing from her incisions.  She is  starting to ride [name] stationary bike as well.  She will return for  followup in one month's time.  Hopefully, all of her incisions will be  healed at that point.  We will then reevaluate whether or not she needs  any further intervention.   Janetta Hora. Fields, MD  Electronically Signed   CEF/MEDQ  D:  12/28/2008  T:  12/29/2008  Job:  [id number]

## 2011-05-14 NOTE — Assessment & Plan Note (Signed)
OFFICE VISIT   Alisha, [name] A  DOB:  [birth year]                                       09/27/2009  YNWGN#:[id number]   The patient returns for followup today.  She was last seen in April of  2010.  She had previously undergone a left femoral endarterectomy with  profundoplasty and left femoral to below knee popliteal bypass with vein  in November of 2009.  Since that time the bypass has occluded.  She  returns today complaining of pain in her left leg.  When she was seen in  April she complained primarily of claudication symptoms in her left leg.  At that time she denied any rest pain.  She stated that she developed a  cramping sensation in her left calf after walking 10 minutes.  At that  time the plan was for risk factor modification rather than entertaining  any revascularization.  ABI on the left side at that time was 0.9.   She returns today complaining of worsening pain in her left lower  extremity.  She states that her left leg swells on standing on it more  than 7-10 minutes.  She has pain in her left leg pain occurs in the hip  all the way down to the toes and has been present for approximately 4  months.  She states that the pain in her foot wakes her at night time.  She has pain continuously with different descriptions in her incisions,  her foot and her leg.  Although she does still have some symptoms  consistent with claudication type symptoms some of her other leg  problems do not really sound consistent with arterial occlusive disease.  She has minimal complaints of her right leg although she does have some  mild claudication symptoms on that side as well.  She currently  ambulates with a cane.   Her atherosclerotic risk factors remain hypertension and elevated  cholesterol.  She has also had a history of pulmonary edema.  Unfortunately she continues to smoke about a half a pack of cigarettes  per day.   PHYSICAL EXAM:  Today blood pressure  is 153/90 in the left arm, pulse is  77 and regular.  Lower extremities:  She has 2+ femoral pulses  bilaterally.  She has absent popliteal and pedal pulses bilaterally.  Feet are pink, warm and well-perfused with brisk cap refill bilaterally.  She has no ulcerations on the feet.   CURRENT MEDICATIONS:  1. Include aspirin 81 mg once a day.  2. Multivitamin.  3. Hydrochlorothiazide 25 mg once a day.  4. Coreg 12.5 mg twice a day.  5. Lisinopril 40 mg once a day.  6. Ambien 5 mg q.h.s.  7. Xanax 1 mg as needed.  8. Klonopin 0.5 mg 1 tablet q.8 h.  9. Lasix 20 mg once a day.  10.Hydrocodone 10/325 p.r.n.  11.Lipitor 40 mg half tablet once a day.  12.Xopenex.   ALLERGIES:  She states an allergy to Darvocet.   She had bilateral ABIs performed today which were 0.88 on the left with  biphasic waveforms and 0.63 on the right with monophasic waveforms.   In summary, the patient has a very well-perfused left lower extremity  and although she does have claudication symptoms I would not entertain a  redo bypass of her left leg until she  develops critical ischemia in the  left leg which she currently does not have.  She has very mild  claudication symptoms and essentially does not complain of the right leg  at all.  I believe the best option for her currently is continued risk  factor modification.  She does have some pain in her left leg consistent  with removing the saphenous vein with the leg swelling and a compression  garment was prescribed for her today.  She has some pins and needles and  burning sensation in both of her old scars in the groin and the knee.  This could certainly be some neuropathic pain.  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  However, the main point is that  she does not have critical ischemia in her left leg and I would not  entertain any interventions at this point  other than continued risk  factor modification and hopefully smoking cessation.  She will follow up  with me for repeat ABIs in six months' time.  We may need to consider a  pain management consult long-term if she continues to have problems.  She also is currently under evaluation for arthritis at [facility]  Hopefully they will have some relief for her as well.   Janetta Hora. Fields, MD  Electronically Signed   CEF/MEDQ  D:  09/27/2009  T:  09/28/2009  Job:  [id number]

## 2011-05-14 NOTE — Assessment & Plan Note (Signed)
OFFICE VISIT   Alisha [name], Alisha [name]  DOB:  Dec 05, 1961                                       12/07/2008  ZOXWR#:[id number]   The patient returns for followup today.  She underwent left femoral  endarterectomy with profundoplasty and left femoral to below knee  popliteal bypass with nonreversed greater saphenous vein on November  23rd.  She presents to the office today for further followup.  She has  been having some drainage from her left groin.  She has to change the  dressing in this several times [name] day.  She has also had chronic edema in  her left lower extremity.  She is ambulatory but still has [name] moderate  amount of soreness in her leg.   PHYSICAL EXAM:  Today blood pressure 106/71 in the left arm.  Pulse 77  regular.  Temperature 97.7.  Left lower extremity reveals slight  separation of her left groin wound.  There is some serous drainage from  this.  There is no surrounding erythema.  She does have [name] firm area in  the left medial thigh probably representative of some hematoma.  The  remainder of the other incisions appear to be healing well.  She does  not have palpable pulses in the feet.  Feet are cool bilaterally but  symmetric.   She had bilateral ABIs performed today which were 0.69 on the left and  0.58 on the right.  Duplex ultrasound suggest that the left fem-pop  bypass is occluded.   I had [name] lengthy discussion with the patient today informing her that the  bypass had occluded.  I did inform her that the perfusion to her left  foot is reasonable enough she is not currently at risk of limb loss.  I  do not believe she is [name] candidate for thrombectomy of this bypass  currently due to the wound problem she is having in her groin.  I  believe she would be at very high risk for problems with the bypass  graft, especially with wound healing in this acute phase.  Additionally,  she currently does not have limb threatening ischemia.  I did inform her  that she probably will continue to have her baseline claudication but  may have some improvement due to be endarterectomy and profundoplasty.  We did remove every other staple from her leg today and also set up  normal saline wet-to-dry wound care for her left groin.  Also had [name]  discussion with her daughter in the office today.  She was fairly  hostile initially as her mother apparently had told her that, without [name]  bypass, she would lose her leg.  I did discuss with her daughter that I  had spoken with the patient on several occasions informing her that she  is not at risk of limb loss, and that this would be for lifestyle  limiting claudication symptoms only.  I also informed her that I had  discussed with her mother on several occasions that she did not have  limb threatening ischemia and certainly the risk of limb loss could be  higher with the operation.  The patient will return for followup in 2  weeks' time for removal of the remainder of her staples.  She will see  me in three weeks' time to make sure the wounds are continuing  to heal.  At some point in the future, we would consider whether or not to redo  the left fem-pop bypass if all the wounds are healed.  However, at this  point I am quite reluctant to do that due to the patient's overall poor  outcome on the first operation.   Janetta Hora. Fields, MD  Electronically Signed   CEF/MEDQ  D:  12/07/2008  T:  12/08/2008  Job:  [id number]

## 2011-05-14 NOTE — Assessment & Plan Note (Signed)
OFFICE VISIT   Alisha, [name] A  DOB:  [birth year]                                       01/25/2009  YQMVH#:[id number]   The patient returns for followup today.  She underwent a left common  femoral endarterectomy and left femoral to below-knee popliteal bypass  with vein in November 2009.  The bypass graft failed shortly thereafter.  The bypass had been originally done for claudication and she has  returned to her claudication state at this point.  However, she states  that she is walking 30 minutes daily.  Unfortunately, she continues to  smoke and is probably smoking between 1/2 to 1 pack per day.   On exam today, the wounds in her leg still have 2 areas of drainage but  these have almost completely healed.  Her foot is warm and adequately  perfused.   She will follow up with me in 3 months' time and have repeat ABIs at  that time.  She still wishes to consider a redo operation.  However, I  am reluctant to do this and also have adamantly told her today that I  would not consider any redo operation unless she is able to completely  quit smoking.   Janetta Hora. Fields, MD  Electronically Signed   CEF/MEDQ  D:  01/25/2009  T:  01/26/2009  Job:  [id number]

## 2011-05-17 NOTE — Discharge Summary (Signed)
Alisha [name], Alisha [name]                 ACCOUNT NO.:  [id number]   MEDICAL RECORD NO.:  [id number]          PATIENT TYPE:  INP   LOCATION:  4709                         FACILITY:  [facility]   PHYSICIAN:  Madaline Guthrie, M.D.    DATE OF BIRTH:  [birth year]   DATE OF ADMISSION:  04/12/2006  DATE OF DISCHARGE:  04/17/2006                                 DISCHARGE SUMMARY   CONSULTS:  1.  Dr. Jayme Cloud with [facility]  2.  [facility]   DISCHARGE DIAGNOSES:  1.  Flash pulmonary edema most likely secondary to severe uncontrolled      hypertension (patient has been ruled out for bilateral renal artery      stenosis as well as an acute myocardial infarction with a negative      catheterization).  2.  Uncontrolled hypertension.  3.  Tobacco abuse.   DISCHARGE MEDICATIONS:  1.  Hydrochlorothiazide 25 mg p.o. daily to be started on April 19, 2006.  2.  Lopressor 25 mg p.o. b.i.d.  3.  Aspirin 81 mg p.o. daily.  4.  Lisinopril 10 mg p.o. daily.  This medicine is currently on hold since      patient has acute renal failure from contrast-induced nephropathy and it      will be resumed once the acute renal failure resolves.   DISPOSITION AND FOLLOW-UP:  Patient will follow up with Dr. Ardyth Harps in the  [facility] on April 21, 2006 at 9 a.m.  At the follow-up  visit the following will be checked.  A blood pressure check will be  performed since patient needs aggressive hypertensive control of pressure.  A BMET to make sure her creatinine has come back to normal to ensure  resolution of renal function.  To follow up on her pending laboratories  which is 24-hour urine for catecholamines to rule out pheochromocytoma.  Patient will also need follow-up vaginal ultrasound after one to two cycles  since the right ovary was prominent on pelvic CT performed on April 14, 2006  with a question of ovarian cyst.   PROCEDURES DONE:  1.  Patient had an endotracheal intubation on  April 12, 2006 since she      presented in flash pulmonary edema.  She was promptly extubated the      following day.  2.  A 2-D echocardiogram was performed on April 14, 2006 and it showed left      ventricular ejection function estimated at 45% with severe hypokinesis      of the mid, distal, and posterolateral wall and moderate to markedly      increased left ventricular wall thickness as well as aortic valve      thickness.  3.  Follow-up cardiac catheterization done on April 16, 2006 by [facility]      Cardiology showed luminal irregularities of up to 30-40% in proximal      circumflex which is most likely because of elevated blood pressure and      it also noted that she is suffering from both systolic and diastolic  compression resulting in her pulmonary edema and mild diffuse      hypokinesis with apical hypertrophy and ejection fraction of 50-55%.  4.  A CT angiogram performed on April 14, 2006 to rule out renal artery      stenosis did not show any evidence of renal artery stenosis and showed      mild atherosclerotic calcifications in the infrarenal abdominal aorta      and iliac vessels.  5.  A pelvic CT done showed prominent right ovary, question ovarian cyst      which could be followed up with ultrasound in one to two cycles.   HOSPITAL ADMISSION HISTORY AND PHYSICAL:  Ms. Alisha [name] is a 50 year old African-  American female that was brought to the emergency room from the field  because of increasing respiratory distress and was intubated by EMS.  Initially she was noted to be hypotensive and was placed on the dopamine  drip with a heart rate of 134 and blood pressure of 212/126 on arrival to  the ED.  She was also given diuretics as well a right subclavian central  line was also placed.  Her past medical history was unknown and she was  intubated upon arrival to the emergency room.   SOCIAL HISTORY:  She works as a Airline pilot and per her daughter she is a  smoker with unsure  quantity.   PHYSICAL EXAMINATION:  VITAL SIGNS:  Blood pressure 213/137, heart rate was  124.  She was saturating 100% on 100% FiO2.  NECK:  Supple.  LUNGS:  She has positive rales bilaterally.  CARDIOVASCULAR:  Tachycardic with a regular rate.  No murmurs.  ABDOMEN:  Obese with positive bowel sounds.  EXTREMITIES:  No edema.   LABORATORIES:  UDS was negative.  Alcohol level was negative.  Hemoglobin  16.4, white count 18.1, platelets 246.  Sodium 134, potassium 4.4, chloride  108, bicarbonate 23, BUN 18, creatinine 1.2, glucose 371.  Chest x-ray  showed pulmonary edema.   HOSPITAL COURSE:  #1 - VENTILATOR-DEPENDENT RESPIRATORY FAILURE SECONDARY TO  FLASH PULMONARY EDEMA SECONDARY TO UNCONTROLLED HYPERTENSION:  As mentioned  above, EMS was called for respiratory distress and they intubated the  patient en route.  When she came to the emergency room she had bilateral  crackles as well as pulmonary edema noted on the chest x-ray.  She was  aggressively diuresed and was also started on Cardene drip for uncontrolled  hypertension.  She was shortly extubated.  Also, she was evaluated for  causes of flash pulmonary edema.  A CT angiogram performed of the abdomen  ruled out renal artery stenosis.  A 2-D echocardiogram performed showed low  normal ejection fraction, thus cardiologist consult was obtained and  catheterization performed on April 16, 2006 was negative for coronary artery  disease.  Also, a 24-hour urine was sent for pheochromocytoma but the  studies are pending and will be followed on the hospital follow-up visit.  It seems that uncontrolled hypertension is the most likely cause of her  flash pulmonary edema and that she needs aggressive blood pressure control  in the future.   #2 - UNCONTROLLED HYPERTENSION:  As mentioned above, in the emergency room  her blood pressure was 213/137.  Initially, she was started on the Cardene drip and was also given Lasix for diuresis.  Once  she was extubated she was  started on hydrochlorothiazide, metoprolol as well as lisinopril and had  excellent control of her blood pressure.  Thus,  she is discharged home on  HCTZ and metoprolol.  The ACE inhibitor has been on hold given her contrast-  induced nephropathy and will be resumed once her renal failure resolves.   #3 - ACUTE RENAL FAILURE MOST LIKELY SECONDARY TO CONTRAST-INDUCED  NEPHROPATHY:  Patient's creatinine was normal up until the day after cardiac  catheterization when her creatinine bumped up from 0.8 to 1.8.  A urine  sodium was 114.  Also, patient was making urine and thus a renal ultrasound  was not indicated.  This was thought to be most likely secondary to contrast-  induced nephropathy.  Since patient was anxious to go home BMET was repeated  the same day and showed stable creatinine at 1.8 and does not show further  deterioration.  Thus, it was decided that patient be discharged with very  close follow-up on her creatinine which will be done four days after  discharge on April 21, 2006.  Her ACE inhibitor has been kept on hold until  she comes for a follow-up visit and will be resumed once her acute renal  failure has been resolved.   DISCHARGE LABORATORIES:  Sodium 135, potassium 4.7, chloride 105,  bicarbonate 18, glucose 114, BUN 29, creatinine 1.8, calcium 9.6.   PERTINENT LABORATORIES:  TSH 2.668.  A hemoglobin A1c is 5.5.   PENDING LABORATORIES:  24-hour urine for catecholamines as well as plasma  metanephrines to evaluate pheochromocytoma.   DISCHARGE VITALS:  Temperature 98.7, pulse 83, blood pressure 109/68,  respirations 18, O2 saturations 97% on room air.      Ellie Lunch, M.D.  Electronically Signed      Madaline Guthrie, M.D.  Electronically Signed    BP/MEDQ  D:  04/17/2006  T:  04/18/2006  Job:  [id number]

## 2011-05-17 NOTE — Cardiovascular Report (Signed)
Alisha [name], Alisha [name]                 ACCOUNT NO.:  [id number]   MEDICAL RECORD NO.:  [id number]          PATIENT TYPE:  INP   LOCATION:  4709                         FACILITY:  [facility]   PHYSICIAN:  Meade Maw, M.D.    DATE OF BIRTH:  01-25-1961   DATE OF PROCEDURE:  DATE OF DISCHARGE:                              CARDIAC CATHETERIZATION   REFERRING PHYSICIAN:  Coralyn Helling, M.D.   INDICATION FOR PROCEDURE:  Cardiomyopathy, pulmonary edema.   PROCEDURE:  After obtaining written informed consent, the patient was  brought to the cardiac catheterization lab in a postabsorptive state.  Preop  sedation was achieved using Versed 3 mg IV, fentanyl 50 mcg IV.  The right  groin was prepped and draped in the usual sterile fashion.  Local anesthesia  was achieved using 1% Xylocaine.  A 6-Sapna hemostasis sheath was placed  into the right femoral artery using the modified Seldinger technique.  Selective coronary angiography was performed using a JL-4, JR-4 Judkins  catheter.  Multiple views were obtained.  All catheter exchanges were made  over the guidewire.  There was significant bridging noted in the circumflex  system.  The films were reviewed with Dr. Verdis Prime for an additional  opinion.  He agreed that there is no critical coronary artery disease and  that bridging was occurring.  The patient was transferred to the holding  area and the hemostasis sheath was removed.  Hemostasis was achieved using  digital pressure.   FINDINGS:  The aortic pressure was 101/74, LV pressure was 99/3, the EDP was  7.   Single-plane ventriculogram revealed mild diffuse hypokinesis, ejection  fraction of 50-55%.  She was noted to have apical hypertrophy.  There was no  significant mitral regurgitation noted.   CORONARY ANGIOGRAPHY:  The left main coronary artery bifurcates into the  left anterior descending and circumflex vessels.  There was no significant  disease noted in the left main coronary  artery.   Left anterior descending:  Left anterior descending gives rise to a large  bifurcating D-1, goes on to end as a long apical branch.  There is no  disease noted in the left anterior descending or its branches.   Circumflex vessel:  Circumflex vessel gives rise to a trivial OM-1 and then  a large OM-2, which covers a large portion of the lateral wall, and the  ongoing circumflex covers the posterior wall as well, and the circumflex is  a large artery, is codominant in circulation.  There is significant  myocardial bridging noted in the first large obtuse marginal.   Right coronary artery:  Right coronary artery is small, codominant.  The  posterior circulation gives rise to a trivial RV marginal, a small PDA and  small PL branch.  There is no disease noted in the right coronary artery or  its branches.   FINAL IMPRESSION:  1.  Luminal irregularities of up to 30-40% in the proximal circumflex.      There is significant bridging noted in the second large obtuse marginal,      which is most likely when  her blood pressure is elevated.  She is      suffering from      both systolic and diastolic compression resulting in her pulmonary edema  2.  Mild diffuse hypokinesis with apical hypertrophy, ejection fraction of      50-55%.   RECOMMENDATIONS:  Aggressive hypertensive control of pressure.      Meade Maw, M.D.  Electronically Signed     HP/MEDQ  D:  04/16/2006  T:  04/17/2006  Job:  [id number]

## 2011-05-17 NOTE — Op Note (Signed)
NAMEDEBBERA, WOLKEN                 ACCOUNT NO.:  [id number]   MEDICAL RECORD NO.:  [id number]          PATIENT TYPE:  AMB   LOCATION:  SDS                          FACILITY:  [facility]   PHYSICIAN:  Charles E. Fields, MD  DATE OF BIRTH:  11/06/1961   DATE OF PROCEDURE:  10/14/2006  DATE OF DISCHARGE:  10/14/2006                                 OPERATIVE REPORT   PROCEDURE:  Aortogram with bilateral lower extremity runoff.   PREOPERATIVE DIAGNOSIS:  Bilateral lower extremity claudication.   POSTOPERATIVE DIAGNOSIS:  Bilateral lower extremity claudication.   ANESTHESIA:  Local.   OPERATIVE DETAILS:  After obtaining informed consent, the patient was taken  to the PV lab.  Both groins were prepped and draped in the usual sterile  fashion.  Local anesthesia was infiltrated over the right common femoral  artery.  Several attempts were made to cannulate the right common femoral  artery with a Majestic needle.  These were unsuccessful.  The vein was  inadvertently cannulated three times.  At this point attempts were aborted  to cannulate the right common femoral artery.  Attention was then turned to  the left groin.  Local anesthesia was infiltrated over the left common  femoral artery.  A Majestic needle was used to successfully cannulate the  left common femoral artery and a 0.035 J-tip guidewire threaded into the  abdominal aorta under fluoroscopic guidance.  Next a 5-Latoya sheath was  placed over the guidewire in the left groin.  This was flushed thoroughly  with heparinized saline.   Next a 5-Shweta pigtail catheter was placed over the guidewire in the  abdominal aorta.  Abdominal aortogram was obtained.  There are single renal  arteries bilaterally.  These were widely patent.  There is minimal  atherosclerotic change of the distal abdominal aorta.  There is some mild  calcification of the right common iliac artery.  There is no significant  flow-limiting lesion in the common iliac  artery on either side.  The left  common iliac artery has a high-grade greater than 90% stenosis of the  origin.  The left external iliac artery is patent throughout its course.  The right internal and external iliac arteries are widely patent.  Oblique  views of the pelvis were also obtained.  This shows some tapered narrowing  of the right common femoral artery.  The left common femoral artery is  widely patent.  The right and left profunda femoris arteries are patent.  The SFA is occluded at its origin on the right side.  The left superficial  femoral artery is diffusely diseased and occludes in the proximal thigh.  The profunda is patent throughout its course on both sides.  The right above-  knee popliteal artery reconstitutes, but there is a 70% stenosis just above  the femoral condyle on the right.  The left popliteal artery also  reconstitutes above the knee but is small in diameter.  The distal popliteal  artery is widely patent bilaterally below the knee.  On the right side below  the knee the anterior tibial artery is  patent with mild to moderate disease.  The tibioperoneal trunk is patent on the right side with a patent peroneal  and posterior tibial artery.  The posterior tibial and peroneal artery as  well as anterior tibial artery is also patent on the left side.  These are  all fairly small in caliber but patent down to the level of the foot.   Several peek-hold views were obtained of the knee and distal calf to further  define the anatomy, which again shows three-vessel runoff with mild  atherosclerotic disease, primarily the anterior tibial artery, but this is  the primary runoff vessel to the foot bilaterally.  Next, an oblique view  was obtained of the right common femoral artery.  This again shows some mild  tapering but no significant flow-limiting lesion and occlusion of the right  superficial femoral artery at its origin.   Next the pigtail catheter was removed  over a guidewire.  The 5-Caci sheath  was removed in the holding area.  The patient tolerated the procedure well  and there were no complications.  The patient was taken to the holding area  in stable condition with a 5-Adrie sheath in the left groin.   OPERATIVE FINDINGS:  1. 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  2. Moderate to severe above-knee popliteal disease.  3. Bilateral three-vessel runoff with mild tibial occlusive disease.      Janetta Hora. Fields, MD  Electronically Signed     CEF/MEDQ  D:  10/14/2006  T:  10/15/2006  Job:  [id number]

## 2011-06-11 ENCOUNTER — Encounter: Payer: Medicare Other | Admitting: Internal Medicine

## 2011-06-18 ENCOUNTER — Ambulatory Visit (INDEPENDENT_AMBULATORY_CARE_PROVIDER_SITE_OTHER): Payer: Medicare Other | Admitting: Internal Medicine

## 2011-06-18 ENCOUNTER — Encounter: Payer: Self-pay | Admitting: Internal Medicine

## 2011-06-18 VITALS — BP 163/98 | HR 90 | Temp 97.9°F | Ht 64.0 in | Wt 245.9 lb

## 2011-06-18 DIAGNOSIS — M79609 Pain in unspecified limb: Secondary | ICD-10-CM

## 2011-06-18 DIAGNOSIS — A5901 Trichomonal vulvovaginitis: Secondary | ICD-10-CM

## 2011-06-18 DIAGNOSIS — G8929 Other chronic pain: Secondary | ICD-10-CM

## 2011-06-18 DIAGNOSIS — F329 Major depressive disorder, single episode, unspecified: Secondary | ICD-10-CM

## 2011-06-18 MED ORDER — ATORVASTATIN CALCIUM 40 MG PO TABS: 40.0000 mg | ORAL_TABLET | Freq: Every day | ORAL | Status: DC

## 2011-06-18 MED ORDER — ASPIRIN 81 MG PO TABS: 81.0000 mg | ORAL_TABLET | Freq: Every day | ORAL | Status: DC

## 2011-06-18 MED ORDER — HYDROCHLOROTHIAZIDE 25 MG PO TABS: 25.0000 mg | ORAL_TABLET | Freq: Every day | ORAL | Status: DC

## 2011-06-18 MED ORDER — WARFARIN SODIUM 7.5 MG PO TABS: ORAL_TABLET | ORAL | Status: DC

## 2011-06-18 MED ORDER — TRAMADOL HCL 50 MG PO TABS: 50.0000 mg | ORAL_TABLET | Freq: Four times a day (QID) | ORAL | Status: DC | PRN

## 2011-06-18 MED ORDER — METRONIDAZOLE 500 MG PO TABS: 500.0000 mg | ORAL_TABLET | Freq: Two times a day (BID) | ORAL | Status: DC

## 2011-06-18 MED ORDER — GABAPENTIN 800 MG PO TABS: ORAL_TABLET | ORAL | Status: DC

## 2011-06-18 MED ORDER — ALBUTEROL SULFATE HFA 108 (90 BASE) MCG/ACT IN AERS: 2.0000 | INHALATION_SPRAY | Freq: Four times a day (QID) | RESPIRATORY_TRACT | Status: DC | PRN

## 2011-06-18 NOTE — Patient Instructions (Signed)
Return in one month.  Start all your medications as prescribed. You and your partner both need treatment for trichomoniasis

## 2011-06-18 NOTE — Progress Notes (Signed)
  Subjective:    Patient ID: Alisha [name], female    DOB: May 24, 1961, 50 y.o.   MRN: [id number]  Pelvic Pain The patient's primary symptoms include pelvic pain.  Leg Pain    50 year old who comes in to the clinic with her partner has following complains. 1. She does not have any meds, as she is out of MAP program after qualifying for medicaid. 2. She continue to have trachimonas related symptoms. Her partner was treated but may not have been "adequetely" treated according to her. 3. She has a sad gloomy face and once again bitterly complains about her pain symptoms. When I informed her that I had refilled her meds, she asks if I filled "all of her meds". To which, I asked which one she wanted to confirm, and she says "percocet". I informed her that the clinic no longer will prescribe controlled substances, as she has violated contract on four occassions. She seems to be understand but prodding to see if she is "banned" for life regarding these pain pills from our clinic. To which, I replied  "we at this time can not provide you controlled substances".  Review of Systems  Genitourinary: Positive for pelvic pain.   positive as per history of present illness otherwise denies chest pain or shortness of breath.     Objective:   Physical Exam  Constitutional: She is oriented to person, place, and time. She appears well-developed and well-nourished. No distress.  HENT:  Head: Normocephalic and atraumatic.  Mouth/Throat: Oropharynx is clear and moist. No oropharyngeal exudate.  Neck: Normal range of motion. Neck supple.  Cardiovascular: Normal rate, regular rhythm and normal heart sounds.   Pulmonary/Chest: Effort normal and breath sounds normal.  Abdominal: Soft. Bowel sounds are normal.  Musculoskeletal:       Limited range of motion due to pain, no erythema, no swelling, no increased warmth noted  Neurological: She is alert and oriented to person, place, and time.  Skin: No rash noted. No  erythema.       Scar noted left leg. Trace lower extremity swelling  Psychiatric:       Anxious appearing   distraught tearful female. At times angry at times tearful. In no acute respiratory distress. Lungs are clear. Heart regular rate and rhythm. Extremities without pitting edema no warmth in the calves.        Assessment & Plan:

## 2011-06-18 NOTE — Assessment & Plan Note (Signed)
Presumptive diagnosis of recurrence in her and her partner, who also is presents and reports symptoms related to trichomoniasis. This time I choose to treat seven day course of 500mg  bid of metronidazole.

## 2011-06-18 NOTE — Assessment & Plan Note (Signed)
Follow up on referral to the sports medicine.

## 2011-06-18 NOTE — Assessment & Plan Note (Signed)
Has appointment with her mental health provider tomorrow. She reports she is out of cymbalta.

## 2011-06-27 ENCOUNTER — Ambulatory Visit (INDEPENDENT_AMBULATORY_CARE_PROVIDER_SITE_OTHER): Payer: Medicare Other | Admitting: Pharmacist

## 2011-06-27 DIAGNOSIS — Z7901 Long term (current) use of anticoagulants: Secondary | ICD-10-CM

## 2011-06-27 DIAGNOSIS — I82409 Acute embolism and thrombosis of unspecified deep veins of unspecified lower extremity: Secondary | ICD-10-CM

## 2011-06-27 MED ORDER — WARFARIN SODIUM 7.5 MG PO TABS: ORAL_TABLET | ORAL | Status: DC

## 2011-06-27 NOTE — Progress Notes (Signed)
Anti-Coagulation Progress Note  Alisha [name] is a 50 y.o. female who is currently on an anti-coagulation regimen.    RECENT RESULTS: Recent results are below, the most recent result is correlated with a dose of 41.25 mg. per week: Lab Results  Component Value Date   INR 1.2 06/27/2011   INR 3.5 02/25/2011   INR 4.0 02/11/2011    ANTI-COAG DOSE:   Latest dosing instructions   Total Sun Mon Tue Wed Thu Fri Sat   48.75 7.5 mg 7.5 mg 3.75 mg 7.5 mg 7.5 mg 7.5 mg 7.5 mg    (7.5 mg1) (7.5 mg1) (7.5 mg0.5) (7.5 mg1) (7.5 mg1) (7.5 mg1) (7.5 mg1)         ANTICOAG SUMMARY: Anticoagulation Episode Summary              Current INR goal 2.0-3.0 Next INR check 07/08/2011   INR from last check 1.2! (06/27/2011)     Weekly max dose (mg)  Target end date    Indications DVT, Long term current use of anticoagulant   INR check location [facility]erred lab    Send INR reminders to [facility]   Comments        Provider Role Specialty Phone number   Levada Schilling [facility]  Internal Medicine [phone]        ANTICOAG TODAY: Anticoagulation Summary as of 06/27/2011              INR goal 2.0-3.0     Selected INR 1.2! (06/27/2011) Next INR check 07/08/2011   Weekly max dose (mg)  Target end date    Indications DVT, Long term current use of anticoagulant    Anticoagulation Episode Summary              INR check location [facility] Preferred lab    Send INR reminders to [facility]   Comments        Provider Role Specialty Phone number   Levada Schilling [facility]  Internal Medicine [phone]        PATIENT INSTRUCTIONS: Patient Instructions  Patient instructed to take medications as defined in the Anti-coagulation Track section of this encounter.  Patient instructed to take today's dose.  Patient verbalized understanding of these instructions.        FOLLOW-UP Return in 11 days (on 07/08/2011) for Follow up INR.  Hulen Luster, III Pharm.D., CACP

## 2011-06-27 NOTE — Patient Instructions (Signed)
Patient instructed to take medications as defined in the Anti-coagulation Track section of this encounter.  Patient instructed to take today's dose.  Patient verbalized understanding of these instructions.    

## 2011-07-08 ENCOUNTER — Ambulatory Visit: Payer: Medicare Other

## 2011-07-26 ENCOUNTER — Ambulatory Visit (INDEPENDENT_AMBULATORY_CARE_PROVIDER_SITE_OTHER): Payer: Medicare Other | Admitting: Pharmacist

## 2011-07-26 ENCOUNTER — Ambulatory Visit (INDEPENDENT_AMBULATORY_CARE_PROVIDER_SITE_OTHER): Payer: Medicare Other | Admitting: Internal Medicine

## 2011-07-26 ENCOUNTER — Encounter: Payer: Self-pay | Admitting: Internal Medicine

## 2011-07-26 VITALS — BP 159/96 | HR 83 | Temp 97.3°F | Ht 64.5 in | Wt 263.9 lb

## 2011-07-26 DIAGNOSIS — G8929 Other chronic pain: Secondary | ICD-10-CM

## 2011-07-26 DIAGNOSIS — I10 Essential (primary) hypertension: Secondary | ICD-10-CM

## 2011-07-26 DIAGNOSIS — R609 Edema, unspecified: Secondary | ICD-10-CM

## 2011-07-26 DIAGNOSIS — I82409 Acute embolism and thrombosis of unspecified deep veins of unspecified lower extremity: Secondary | ICD-10-CM

## 2011-07-26 DIAGNOSIS — Z7901 Long term (current) use of anticoagulants: Secondary | ICD-10-CM

## 2011-07-26 LAB — POCT INR: INR: 1.9

## 2011-07-26 MED ORDER — TRAMADOL HCL 50 MG PO TABS: 50.0000 mg | ORAL_TABLET | Freq: Four times a day (QID) | ORAL | Status: DC | PRN

## 2011-07-26 MED ORDER — POTASSIUM CHLORIDE 10 MEQ PO TBCR: 8.0000 meq | EXTENDED_RELEASE_TABLET | Freq: Two times a day (BID) | ORAL | Status: DC

## 2011-07-26 MED ORDER — FUROSEMIDE 40 MG PO TABS: 40.0000 mg | ORAL_TABLET | Freq: Every day | ORAL | Status: DC

## 2011-07-26 NOTE — Progress Notes (Signed)
HPI: Ms. Alisha [name] is a 50 yo woman with PMH of depression, bilateral leg edema, presents today for worsening of bilateral leg swelling and pain, one week in duration. Patient reports that she fell a home about one week ago because her knee gave out.  Since then her left leg has been more swollen than the right and that her foot has become darker. She states that she has been elevating her legs above her heart level at home without much improvement. She also states that the hydrochlorothiazide is not helping with her fluid. She also reports that she took Lasix in the past which helped her significantly. She denies any fractures, fevers, chills, nausea or vomiting, loss of consciousness.  Patient is also requesting for her FMLA form to be filled out today.  She also wants blood pressure medication refills including norvasc, coreg, hctz, and lisinopril.  However, those medications are not in EPIC except for hctz.  Upon reviewing her chart in [facility], patient was taken off some of her BP medications because of hypotension.  She has no other complaints today.  ROS: as per HPI  PE: General: alert, well-developed, and cooperative to examination.  Lungs: normal respiratory effort, no accessory muscle use, normal breath sounds, no crackles, and no wheezes. Heart: normal rate, regular rhythm, no murmur, no gallop, and no rub.  Abdomen: soft, obese, non-tender, normal bowel sounds, no distention, no guarding, no rebound tenderness Msk: no joint swelling, no joint warmth, and no redness over joints.  Pulses: 2+ DP/PT pulses bilaterally Extremities: No cyanosis, clubbing,+2-3 nonpitting edema 2/2 chronic venous insufficiency L>R.   Neurologic: alert & oriented X3, cranial nerves II-XII intact, strength normal in all extremities, sensation intact to light touch, and gait normal.  Psych: Oriented X3, memory intact for recent and remote, normally interactive, good eye contact, depressed and anxious appearing

## 2011-07-26 NOTE — Progress Notes (Signed)
Anti-Coagulation Progress Note  Alisha [name] is a 50 y.o. female who is currently on an anti-coagulation regimen.    RECENT RESULTS: Recent results are below, the most recent result is correlated with a dose of 48.75 mg. per week: Lab Results  Component Value Date   INR 1.90 07/26/2011   INR 1.2 06/27/2011   INR 3.5 02/25/2011    ANTI-COAG DOSE:   Latest dosing instructions   Total Sun Mon Tue Wed Thu Fri Sat   56.25 7.5 mg 7.5 mg 7.5 mg 7.5 mg 7.5 mg 11.25 mg 7.5 mg    (7.5 mg1) (7.5 mg1) (7.5 mg1) (7.5 mg1) (7.5 mg1) (7.5 mg1.5) (7.5 mg1)         ANTICOAG SUMMARY: Anticoagulation Episode Summary              Current INR goal 2.0-3.0 Next INR check 08/26/2011   INR from last check 1.90! (07/26/2011)     Weekly max dose (mg)  Target end date    Indications DVT, Long term current use of anticoagulant   [facility] check [facility]    Send INR reminders to [facility]   Comments        Provider Role Specialty Phone number   Levada Schilling [facility]  Internal Medicine [phone]        ANTICOAG TODAY: Anticoagulation Summary as of 07/26/2011              INR goal 2.0-3.0     Selected INR 1.90! (07/26/2011) Next INR check 08/26/2011   Weekly max dose (mg)  Target end date    Indications DVT, Long term current use of anticoagulant    Anticoagulation Episode Summary              INR check location [facility] Preferred lab    Send INR reminders to [facility]   Comments        Provider Role Specialty Phone number   Levada Schilling [facility]  Internal Medicine [phone]        PATIENT INSTRUCTIONS: Patient Instructions  Patient instructed to take medications as defined in the Anti-coagulation Track section of this encounter.  Patient instructed to take today's dose.  Patient verbalized understanding of these instructions.        FOLLOW-UP Return in 4 weeks (on 08/26/2011) for Follow up INR.  Hulen Luster, III Pharm.D., CACP

## 2011-07-26 NOTE — Assessment & Plan Note (Addendum)
This is likely secondary to venous insufficiency. On physical examination, she does have swelling that is slightly greater on the left than right which might be secondary to her fall. 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 She is currently on warfarin therapy and is being followed by Dr. Alexandria Lodge. There is no evidence of infection on physical exam that would indicate cellulitis therefore antibiotics is not indicated. -Will prescribe Lasix 40 mg by mouth daily -Will give potassium chloride supplement 20 mEq qdaily while she is on Lasix -Will stop HCTZ for now -I will recheck BMP on 07/29/2011 -Refill tramadol today

## 2011-07-26 NOTE — Patient Instructions (Signed)
Please stop taking hydrochlorothiazide Start taking Lasix 40mg  one tablet daily Start taking Potassium chloride one tablet twice daily Bring all your medication bottles on Monday when you come for your appointment Follow up on Monday 07/29/11 with Dr. Anselm Jungling

## 2011-07-26 NOTE — Assessment & Plan Note (Signed)
Not adequately controlled however I am not sure exactly what medication is patient supposed to be on. Patient was instructed to bring her medication bottles on 07/29/2011 so that I can review her medications. -Will start Lasix 40 mg by mouth daily today -I will adjust her medication accordingly when she brings her medications to next office visit

## 2011-07-26 NOTE — Patient Instructions (Signed)
Patient instructed to take medications as defined in the Anti-coagulation Track section of this encounter.  Patient instructed to take today's dose.  Patient verbalized understanding of these instructions.    

## 2011-07-29 ENCOUNTER — Encounter: Payer: Self-pay | Admitting: Internal Medicine

## 2011-07-29 ENCOUNTER — Ambulatory Visit (INDEPENDENT_AMBULATORY_CARE_PROVIDER_SITE_OTHER): Payer: Medicare Other | Admitting: Internal Medicine

## 2011-07-29 VITALS — BP 120/75 | HR 80 | Temp 97.3°F | Ht 64.5 in | Wt 259.0 lb

## 2011-07-29 DIAGNOSIS — R609 Edema, unspecified: Secondary | ICD-10-CM

## 2011-07-29 DIAGNOSIS — M25569 Pain in unspecified knee: Secondary | ICD-10-CM

## 2011-07-29 DIAGNOSIS — I10 Essential (primary) hypertension: Secondary | ICD-10-CM

## 2011-07-29 DIAGNOSIS — M79609 Pain in unspecified limb: Secondary | ICD-10-CM

## 2011-07-29 MED ORDER — AMLODIPINE BESYLATE 10 MG PO TABS: 10.0000 mg | ORAL_TABLET | Freq: Every day | ORAL | Status: DC

## 2011-07-29 MED ORDER — LISINOPRIL 40 MG PO TABS: 40.0000 mg | ORAL_TABLET | Freq: Every day | ORAL | Status: DC

## 2011-07-29 MED ORDER — CARVEDILOL 25 MG PO TABS: 25.0000 mg | ORAL_TABLET | Freq: Two times a day (BID) | ORAL | Status: DC

## 2011-07-29 NOTE — Assessment & Plan Note (Signed)
Improving. -Will continue Lasix 40 mg by mouth daily -Will continue potassium chloride 20 mEq daily -I will repeat a BMP today

## 2011-07-29 NOTE — Assessment & Plan Note (Signed)
Well controlled. Per patient's report, she has been taking lisinopril 40 mg by mouth daily, Coreg 25 mg by twice daily, and Norvasc 10 mg by mouth daily.  In addition, she continued to take HCTZ 25 mg by mouth daily given though she was instructed to stop this medication. -Will refill her lisinopril, Coreg and Norvasc -Stop HCTZ today -Continue Lasix 40 mg by mouth daily

## 2011-07-29 NOTE — Patient Instructions (Signed)
Will refer to sport medicine Follow up in 3-4 months

## 2011-07-29 NOTE — Progress Notes (Signed)
History of present illness:Alisha [name] is a 50 year old woman with past medical history of depression, DVT currently on Coumadin, bilateral knee pain and leg edema presents today for followup. Patient was started on Lasix 40 mg by mouth daily for her legs edema which has significantly improved.  She also complained of left knee joint pain for 6 months in duration. She states that she brought up the issue in the past however it was not addressed and she would like to have x-ray of her knee as well as a referral to sport medicine today.  She denies any fever chills nausea or vomiting or any other systemic symptoms. She also presents today for [name] to fill out an FMLA form for her daughter so that her daughter may take days off from work to bring her to the doctor's appointment. She also stated that the FMLA form is being filled out every six-month. She did not bring the complete form; therefore, only part of the form was filled.   She did bring her blood pressure bottles with her today which include Norvasc 10 mg by mouth daily, lisinopril 40 mg by mouth daily and Coreg  25 mg by mouth twice a day which were prescribed by Dr. Coralee Pesa; however I did not see those medications listed in Epic nor centricity.  She does report taking all of her BP meds.No other complaints today  Review of systems: As per history of present illness  Physical examination: General: alert, well-developed, and cooperative to examination.   Lungs: normal respiratory effort, no accessory muscle use, normal breath sounds, no crackles, and no wheezes. Heart: normal rate, regular rhythm, no murmur, no gallop, and no rub.  Abdomen: soft, non-tender, normal bowel sounds, no distention, no guarding, no rebound tenderness Msk: no joint swelling, no joint warmth, and no redness over joints.  Pulses: 2+ DP/PT pulses bilaterally Extremities: No cyanosis, clubbing, +1-2 non-pitting edema.  Left knee tenderness, limited ROM 2/2 pain, no  erythema/drainage/edema/effusion/increased in warmth. No audible clicks/crepitus noted on examination. Right knee within normal limits  Neurologic: alert & oriented X3, cranial nerves II-XII intact, strength normal in all extremities, sensation intact to light touch, and gait normal.  Skin: turgor normal and no rashes.  Psych: Oriented X3, memory intact for recent and remote, normally interactive, good eye contact, +anxious and depressed appearing

## 2011-07-29 NOTE — Assessment & Plan Note (Addendum)
Patient is complaining of knee pain left more than right knee.  X-rays of her knees previously demonstrated degenerative changes. She would like to be referred to sport medicine for further intervention. Physical exam demonstrates limited range of motion of left knee secondary to pain. There is no effusion, erythema, drainage or increase in warmth to indicate infectious etiology. -sport medicine referral today -Continue tramadol when necessary pain

## 2011-07-30 LAB — BASIC METABOLIC PANEL WITH GFR
CO2: 28 mEq/L (ref 19–32)
Chloride: 105 mEq/L (ref 96–112)
Sodium: 143 mEq/L (ref 135–145)

## 2011-08-07 ENCOUNTER — Encounter: Payer: Self-pay | Admitting: Family Medicine

## 2011-08-07 ENCOUNTER — Ambulatory Visit (INDEPENDENT_AMBULATORY_CARE_PROVIDER_SITE_OTHER): Payer: Medicare Other | Admitting: Family Medicine

## 2011-08-07 VITALS — BP 150/96 | HR 85 | Ht 64.5 in | Wt 257.0 lb

## 2011-08-07 DIAGNOSIS — M25569 Pain in unspecified knee: Secondary | ICD-10-CM

## 2011-08-07 DIAGNOSIS — M1710 Unilateral primary osteoarthritis, unspecified knee: Secondary | ICD-10-CM

## 2011-08-07 DIAGNOSIS — M25561 Pain in right knee: Secondary | ICD-10-CM

## 2011-08-07 MED ORDER — AMITRIPTYLINE HCL 25 MG PO TABS: 25.0000 mg | ORAL_TABLET | Freq: Every day | ORAL | Status: DC

## 2011-08-07 NOTE — Patient Instructions (Signed)
You have been scheduled for an appointment at [facility] and [facility] orthopedic for a consultation for knee replacement 08/08/11 at 2:30 pm. Their address is [address] [place], [place] [address], phone # is  [phone].  Please start your amitriptyline tonight.

## 2011-08-07 NOTE — Assessment & Plan Note (Signed)
Her arthritis symptoms have advanced to the point where surgical intervention is reasonable.  She will be evaluated by ortho tomorrow for possible bilateral replacements.  Offered her an injection today to help with her acute pain but she was not interested.  She would rather try something PO.  Amytriptyline was added qhs which should help with her night pain.  Discussed common side effects of amytriptyline and answered her questions prior to discharge.  Patient is no longer taking albuterol so she should be able to use amytriptyline.

## 2011-08-07 NOTE — Progress Notes (Signed)
  Subjective:    Patient ID: Alisha [name], female    DOB: Oct 25, 1961, 50 y.o.   MRN: [id number]  HPI 50 y/o female is here c/o bilateral worsening knee pain for many years.  She is now homebound because of the pain.  She has tried oral pain medications, and physical therapy without improvement.  The pain is worsened by any activity.  She walks downstairs sideways so that she can avoid bending her knees because bending is so painful.  She recently underwent another round of physical therapy, 6 at home sessions, without improvement. She does have night pain every night.  She has taken percocet with some improvement.  She is currently taking ultram 50 mg, sometimes 8 per day and still has pain.   Review of Systems     Objective:   Physical Exam  Bilateral knees: Motion is 0-100 bilaterally with pain at the end of flexion Severe crepitus bilaterally Mild bilateral effusions without warmth to palpation Diffuse tenderness to palpation  Positive patellar grind bilat Patella motion decreased bilat     Assessment & Plan:

## 2011-08-16 ENCOUNTER — Other Ambulatory Visit: Payer: Self-pay | Admitting: Internal Medicine

## 2011-08-26 ENCOUNTER — Ambulatory Visit (INDEPENDENT_AMBULATORY_CARE_PROVIDER_SITE_OTHER): Payer: Medicare Other | Admitting: Internal Medicine

## 2011-08-26 ENCOUNTER — Ambulatory Visit (INDEPENDENT_AMBULATORY_CARE_PROVIDER_SITE_OTHER): Payer: Medicare Other | Admitting: Pharmacist

## 2011-08-26 ENCOUNTER — Encounter: Payer: Self-pay | Admitting: Internal Medicine

## 2011-08-26 VITALS — BP 150/90 | HR 89 | Temp 98.4°F | Ht 64.5 in | Wt 257.7 lb

## 2011-08-26 DIAGNOSIS — Z7901 Long term (current) use of anticoagulants: Secondary | ICD-10-CM

## 2011-08-26 DIAGNOSIS — I82409 Acute embolism and thrombosis of unspecified deep veins of unspecified lower extremity: Secondary | ICD-10-CM

## 2011-08-26 DIAGNOSIS — M25569 Pain in unspecified knee: Secondary | ICD-10-CM

## 2011-08-26 DIAGNOSIS — M25561 Pain in right knee: Secondary | ICD-10-CM

## 2011-08-26 LAB — POCT INR: INR: 5.7

## 2011-08-26 MED ORDER — MELOXICAM 7.5 MG PO TABS: 7.5000 mg | ORAL_TABLET | Freq: Every day | ORAL | Status: DC

## 2011-08-26 MED ORDER — TRAMADOL HCL 50 MG PO TABS: 50.0000 mg | ORAL_TABLET | Freq: Four times a day (QID) | ORAL | Status: DC | PRN

## 2011-08-26 NOTE — Progress Notes (Signed)
Patient well known to me. I agree with assessment and plan as per Dr. Dorise Hiss.

## 2011-08-26 NOTE — Progress Notes (Signed)
Subjective:    Patient ID: Alisha [name], female    DOB: [birth year], 50 y.o.   MRN: [id number]  HPI: The patient is a 50 year old female who does come in today for an acute visit for some knee pain that she's been having. She has had this knee pain for some time. She states that it has been going on and has not changed dramatically recently. She did see a sports medicine doctor at the beginning of August 2 did refer her to orthopedics and tell her that it may need to be surgically replaced. Both knees do hurt her. She did see the orthopedic doctor per patient and said that they injected both knees. She stated this did help for about a week and then went back her level of pain. States that her pain is not controlled currently on tramadol. She is having no other acute symptoms at today's visit. Her other significant medical problems include hyperlipidemia, tobacco abuse, depression, restless leg, hypertension, DVT, anticoagulation chronically.    Review of Systems  Constitutional: Positive for activity change. Negative for fever, chills, diaphoresis, fatigue and unexpected weight change.       Patient has been able to ambulate as she has extreme pain in her legs. I did encourage what activity she could do.   HENT: Negative.   Eyes: Negative.   Respiratory: Negative for cough, choking, chest tightness, shortness of breath, wheezing and stridor.   Cardiovascular: Negative for chest pain, palpitations and leg swelling.  Gastrointestinal: Negative for nausea, vomiting, abdominal pain, diarrhea, constipation, blood in stool and abdominal distention.  Musculoskeletal: Positive for myalgias, joint swelling and gait problem. Negative for back pain and arthralgias.       Gait limited due to pain.  Skin: Negative.   Neurological: Negative.   Hematological: Negative.   Psychiatric/Behavioral: Negative.     Vitals: BP: 150/90     Objective:   Physical Exam  Constitutional: She is oriented to person,  place, and time. She appears well-developed and well-nourished.       Patient is obese.  HENT:  Head: Normocephalic and atraumatic.  Eyes: EOM are normal. Pupils are equal, round, and reactive to light.  Neck: Normal range of motion. Neck supple. No tracheal deviation present. No thyromegaly present.  Cardiovascular: Normal rate and regular rhythm.   Pulmonary/Chest: Effort normal and breath sounds normal. No respiratory distress. She has no wheezes. She has no rales.  Abdominal: Soft. Bowel sounds are normal. She exhibits no distension. There is no tenderness. There is no rebound and no guarding.  Musculoskeletal: She exhibits tenderness.  Lymphadenopathy:    She has no cervical adenopathy.  Neurological: She is alert and oriented to person, place, and time. No cranial nerve deficit.  Skin: Skin is warm and dry.  Psychiatric: She has a normal mood and affect. Her behavior is normal. Judgment and thought content normal.          Assessment & Plan:  1. Knee pain-I did advise the patient to go see Dr. Leilani Merl (pain medicine specialist) who she has seen in the past. I did advise her to keep her sports medicine appointments her vein appointments and her orthopedic appointments. I did refill her tramadol which she would like to increase to 5 times daily. I also written her a prescription for Mobic 7.5 mg daily and told her to take it once daily with food. We will trial this for one month period to see if it helps calm down the inflammation in  her knees. If it does not we will discontinue it. I would only recommend trialing this for one month. Because she is anticoagulated chronically I would not like her to be on this long-term, this is only a short duration therapy. She has violated a pain contract in the past therefore we do not prescribe narcotics for her. I explained this to her and she did understand and she knew about it before I even explained. I did explain to her that the smoking could  be causing her increased pain as she does have some peripheral vascular disease. 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 We will see her back in one month for close followup on the status of her pain.  2. Other medical problems not addressed at today's visit: Hyperlipidemia, depression, restless leg syndrome, hypertension, peripheral vascular disease, DVT, breast lump, leg edema. Please note that I am aware her blood pressure was 150/90 at today's visit however I do feel this is elevated due to pain. I will not make any changes to her medication regimen today.  3. Disposition-patient will be seen back in one month for close followup or for pain. Please stop Mobic at this time. This is only to be a one month's duration therapy. Please check on records from orthopedics, sports medicine, vein Center.

## 2011-08-26 NOTE — Patient Instructions (Signed)
Patient instructed to take medications as defined in the Anti-coagulation Track section of this encounter.  Patient instructed to OMIT/DO NOT TAKE today's dose.  Patient verbalized understanding of these instructions.    

## 2011-08-26 NOTE — Patient Instructions (Addendum)
You were seen today for your knee pain. We are going to refill your tramadol today. We will have you go back to see your pain doctor, Dr. Wynn Banker. Their number is [phone]. We are not going to prescribe any narcotics. We will give you something to help calm down the inflammation in your knees that you will take daily. It's called Mobic (meloxicam) 7.5 mg and take it once daily with food. Please follow up with the sports medicine people and the vein center. They are working to help with your pain. We are not changing any other medicines today. Please come back in one month to check on your pain. If you have increased problems or feel you need to be seen sooner please call our office. Our number is [phone].   Arthritis - Degenerative, Osteoarthritis You have osteoarthritis. This is the wear and tear arthritis that comes with aging. It is also called degenerative arthritis. This is common in people past middle age. It is caused by stress on the joints from living. The large weight bearing joints of the lower extremities are most often affected. The knees, hips, back, neck, and hands can become painful, swollen, and stiff. This is the most common type of arthritis. It comes on with age, carrying too much weight, and from injury. Treatment includes resting the sore joint until the pain and swelling improve. Crutches or a walker may be needed for severe flares. Only take over-the-counter or prescription medicines for pain, discomfort, or fever as directed by your caregiver. Local heat therapy may improve motion. Cortisone shots into the joint are sometimes used to reduce pain and swelling during flares. Osteoarthritis is usually not crippling and progresses slowly. There are things you can do to decrease pain:  Avoid high impact activities.   Exercise regularly.   Low impact exercises such as walking, biking and swimming help to keep the muscles strong and keep normal joint function.   Stretching  helps to keep your range of motion.   Lose weight if you are overweight. This reduces joint stress.  In severe cases when you have pain at rest or increasing disability, joint surgery may be helpful. See your caregiver for follow-up treatment as recommended.  SEEK IMMEDIATE MEDICAL CARE IF:  You have severe joint pain.   Marked swelling and redness in your joint develops.   You develop a high fever.  Document Released: 12/16/2005 Document Re-Released: 04/16/2008 [facility] Patient Information 2011 [place], [place].

## 2011-08-26 NOTE — Progress Notes (Signed)
Anti-Coagulation Progress Note  Alisha [name] is a 50 y.o. female who is currently on an anti-coagulation regimen.    RECENT RESULTS: Recent results are below, the most recent result is correlated with a dose of 56.25mg . per week: Lab Results  Component Value Date   INR 5.7 08/26/2011   INR 1.90 07/26/2011   INR 1.2 06/27/2011    ANTI-COAG DOSE:   Latest dosing instructions   Total Sun Mon Tue Wed Thu Fri Sat   52.5 7.5 mg 7.5 mg 7.5 mg 7.5 mg 7.5 mg 7.5 mg 7.5 mg    (7.5 mg1) (7.5 mg1) (7.5 mg1) (7.5 mg1) (7.5 mg1) (7.5 mg1) (7.5 mg1)         ANTICOAG SUMMARY: Anticoagulation Episode Summary              Current INR goal 2.0-3.0 Next INR check 09/09/2011   INR from last check 5.7! (08/26/2011)     Weekly max dose (mg)  Target end date    Indications DVT, Long term current use of anticoagulant   [facility]    Send INR reminders to [facility]   Comments        Provider Role Specialty Phone number   Levada Schilling [facility]  Internal Medicine [phone]        ANTICOAG TODAY: Anticoagulation Summary as of 08/26/2011              INR goal 2.0-3.0     Selected INR 5.7! (08/26/2011) Next INR check 09/09/2011   Weekly max dose (mg)  Target end date    Indications DVT, Long term current use of anticoagulant    Anticoagulation Episode Summary              INR check location [facility] Preferred lab    Send INR reminders to [facility]   Comments        Provider Role Specialty Phone number   Levada Schilling [facility]  Internal Medicine [phone]        PATIENT INSTRUCTIONS: Patient Instructions  Patient instructed to take medications as defined in the Anti-coagulation Track section of this encounter.  Patient instructed to OMIT/DO NOT TAKE today's dose.  Patient verbalized understanding of these instructions.        FOLLOW-UP Return in 2 weeks (on 09/09/2011) for Follow up INR.  Hulen Luster, III Pharm.D., CACP

## 2011-09-05 ENCOUNTER — Encounter: Payer: Self-pay | Admitting: Sports Medicine

## 2011-09-05 ENCOUNTER — Ambulatory Visit (INDEPENDENT_AMBULATORY_CARE_PROVIDER_SITE_OTHER): Payer: Medicare Other | Admitting: Sports Medicine

## 2011-09-05 VITALS — BP 122/86 | HR 80 | Ht 65.0 in | Wt 252.0 lb

## 2011-09-05 DIAGNOSIS — M25562 Pain in left knee: Secondary | ICD-10-CM

## 2011-09-05 DIAGNOSIS — M1710 Unilateral primary osteoarthritis, unspecified knee: Secondary | ICD-10-CM

## 2011-09-05 DIAGNOSIS — M25561 Pain in right knee: Secondary | ICD-10-CM

## 2011-09-05 DIAGNOSIS — I739 Peripheral vascular disease, unspecified: Secondary | ICD-10-CM

## 2011-09-05 DIAGNOSIS — T82898A Other specified complication of vascular prosthetic devices, implants and grafts, initial encounter: Secondary | ICD-10-CM

## 2011-09-05 DIAGNOSIS — R0989 Other specified symptoms and signs involving the circulatory and respiratory systems: Secondary | ICD-10-CM

## 2011-09-05 DIAGNOSIS — M25569 Pain in unspecified knee: Secondary | ICD-10-CM

## 2011-09-05 NOTE — Progress Notes (Signed)
Subjective:    Patient ID: Alisha [name], female    DOB: Feb 07, 1961, 50 y.o.   MRN: [id number]   HPI  Complaining of B/L knee pain and calf pain since 2009. She locates her pain in both of her knees, sharp,constant, 8/10 intensity, radiated to her legs, worse with weight bearing activities, No injuries to her knees. She has Hx of femoro popliteal bypass in the left leg in 2009. She states that the bypass was occluded. She saw Dr. Thurston Hole for a possible TKR evaluation, he did a b/l cortisone knee injection, which the patient stated that helped her for a week, x ray were also done which showed mild degenerative changes and recommended her to see her Dr. Jettie Booze for vascular evaluation and  vascular surgeon evaluation recommended as well. Percocet help her with pain.   Patient Active Problem List  Diagnoses  . THYROID STIMULATING HORMONE, ABNORMAL  . HYPERLIPIDEMIA, MIXED  . TOBACCO ABUSE  . DEPRESSION  . RESTLESS LEG SYNDROME  . Chronic pain syndrome  . VISUAL ACUITY, DECREASED  . HYPERTENSION  . PERIPHERAL VASCULAR DISEASE  . DVT  . UNSPECIFIED DISORDER TEETH&SUPPORTING STRUCTURES  . BREAST LUMP  . KNEE PAIN, BILATERAL  . LEG PAIN, BILATERAL  . EDEMA LEG  . HEAD TRAUMA  . Long term current use of anticoagulant  . DYSURIA  . ALTERED MENTAL STATUS  . Subacute sinusitis  . Hot flashes  . Pap smear for cervical cancer screening  . Trichomoniasis of vagina  . Arthritis of knee  . Bilateral knee pain      Current Outpatient Prescriptions on File Prior to Visit  Medication Sig Dispense Refill  . albuterol (PROAIR HFA) 108 (90 BASE) MCG/ACT inhaler Inhale 2 puffs into the lungs every 6 (six) hours as needed for wheezing. Take 1-2 puffs as needed every 4-6 hours as needed for shortness of breath  1 Inhaler  3  . amitriptyline (ELAVIL) 25 MG tablet Take 1 tablet (25 mg total) by mouth at bedtime.  30 tablet  0  . amLODipine (NORVASC) 10 MG tablet Take 1 tablet (10 mg total) by mouth  daily.  30 tablet  11  . aspirin 81 MG tablet Take 1 tablet (81 mg total) by mouth daily.  100 tablet  3  . atorvastatin (LIPITOR) 40 MG tablet Take 1 tablet (40 mg total) by mouth daily.  30 tablet  3  . benzonatate (TESSALON PERLES) 100 MG capsule Take 1 capsule (100 mg total) by mouth every 6 (six) hours as needed for cough.  30 capsule  1  . carvedilol (COREG) 25 MG tablet Take 1 tablet (25 mg total) by mouth 2 (two) times daily.  60 tablet  11  . DULoxetine (CYMBALTA) 60 MG capsule Take 60 mg by mouth. Take 1 capsule in the morning and 1 capsule at bedtime       . furosemide (LASIX) 40 MG tablet Take 1 tablet (40 mg total) by mouth daily.  30 tablet  0  . gabapentin (NEURONTIN) 800 MG tablet Take 1 pill by mouth in the morning, 1 pill by mouth at midday, and 2 pills by mouth at night  120 tablet  3  . hydrochlorothiazide 25 MG tablet Take 25 mg by mouth daily.      Marland Kitchen lisinopril (PRINIVIL,ZESTRIL) 40 MG tablet Take 1 tablet (40 mg total) by mouth daily.  30 tablet  6  .      . potassium chloride (KLOR-CON) 10 MEQ CR tablet  Take 1 tablet (10 mEq total) by mouth 2 (two) times daily.  60 tablet  1  . QUEtiapine (SEROQUEL) 100 MG tablet Take by mouth. Take 1/2-1 tablet at bedtime       . traMADol (ULTRAM) 50 MG tablet Take 1 tablet (50 mg total) by mouth every 6 (six) hours as needed for pain.  150 tablet  0  . warfarin (COUMADIN) 7.5 MG tablet Take as directed from anticoagulation clinic.  30 tablet  2  . ziprasidone (GEODON) 60 MG capsule Take 60 mg by mouth. Take 1capsule by mouth in the morning and 2 capsules at night       Allergies  Allergen Reactions  . Propoxyphene N-Acetaminophen      Review of Systems  Constitutional: Negative for fever and fatigue.  Musculoskeletal:       B/L knee pain with HPI. B/L leg pain with HPI.  Neurological: Negative for tremors, weakness and numbness.       Objective:   Physical Exam  Constitutional: She appears well-developed and well-nourished.         BP 122/86  Pulse 80  Ht 5\' 5"  (1.651 m)  Wt 252 lb (114.306 kg)  BMI 41.93 kg/m2  Eyes: EOM are normal.  Pulmonary/Chest: Effort normal.  Musculoskeletal:       Right knee with intact skin, FROM. Patellofemoral crepitus present with flexion and extension. Patellofemoral compression  test +. No tenderness on the quad neither or patellar tendon. Ligaments intact. Lachman neg. Varus and valgus test at 0 and 30 degres neg Poor quad muscle definition.  TTP in mid joint line Normal gait without a limp.   Left knee with intact skin, FROM. Patellofemoral crepitus present with flexion and extension worse than the right side. Patellofemoral compression  test +. No tenderness on the quad neither or patellar tendon. Ligaments intact. Lachman neg. Varus and valgus test at 0 and 30 degres neg Poor quad muscle definition.  TTP in mid joint line Normal gait without a limp.  Pedal and PT pulse not felt, cap refill 4 sec       Neurological: She is alert.  Skin: Skin is warm. No rash noted. No erythema. No pallor.  Psychiatric: She has a normal mood and affect. Judgment normal.    MSK U/S : looking for popliteal artery flow, showed decrease blood flow in popliteal artery and veins, partial popliteal artery and veins occlusion b/l   B/L. Baker cyst in left knee.       Assessment & Plan:   1. Bilateral knee pain   2. Arthritis of knee   3. Femoral-popliteal bypass graft occlusion, left   4. Poor circulation of extremity    Alisha [name] has been complaining of b/l knee pain since 2009, however not all her symptoms ( leg and calf pain)  seem to be produced just by knee pathology. She had a intrarticular injection by Dr. Thurston Hole (ortho) with improvement during a week.  She has Hx of PVD with a femoro-popliteal bypass in 2009, time when all her symptoms started. We  not palpated any pedal or posterior tib pulse today. MSK U/S showed decrease blood flow in the popliteal arteries b/l.  We  recommend vascular surgery evaluation to r/o vascular claudication. We recommend pain management by PCP as she is in a different medications for chronic pain control. Unsure wether trental may help to some degree

## 2011-09-09 ENCOUNTER — Ambulatory Visit: Payer: Medicare Other

## 2011-09-26 LAB — BASIC METABOLIC PANEL
Calcium: 9.4
Chloride: 114 — ABNORMAL HIGH
Creatinine, Ser: 0.8
GFR calc Af Amer: >60
Sodium: 144

## 2011-09-26 LAB — CBC
MCHC: 34.3
MCV: 96.8
Platelets: 228
RBC: 4.01
WBC: 9.6

## 2011-10-01 ENCOUNTER — Telehealth: Payer: Self-pay | Admitting: Pharmacist

## 2011-10-01 ENCOUNTER — Encounter: Payer: Self-pay | Admitting: Internal Medicine

## 2011-10-01 ENCOUNTER — Ambulatory Visit (INDEPENDENT_AMBULATORY_CARE_PROVIDER_SITE_OTHER): Payer: Medicare Other | Admitting: Internal Medicine

## 2011-10-01 VITALS — BP 128/91 | HR 92 | Temp 97.6°F | Ht 64.5 in | Wt 253.3 lb

## 2011-10-01 DIAGNOSIS — I739 Peripheral vascular disease, unspecified: Secondary | ICD-10-CM

## 2011-10-01 DIAGNOSIS — E782 Mixed hyperlipidemia: Secondary | ICD-10-CM

## 2011-10-01 DIAGNOSIS — N63 Unspecified lump in unspecified breast: Secondary | ICD-10-CM

## 2011-10-01 DIAGNOSIS — Z23 Encounter for immunization: Secondary | ICD-10-CM

## 2011-10-01 DIAGNOSIS — I82409 Acute embolism and thrombosis of unspecified deep veins of unspecified lower extremity: Secondary | ICD-10-CM

## 2011-10-01 DIAGNOSIS — Z7901 Long term (current) use of anticoagulants: Secondary | ICD-10-CM

## 2011-10-01 DIAGNOSIS — G894 Chronic pain syndrome: Secondary | ICD-10-CM

## 2011-10-01 LAB — URINALYSIS, ROUTINE W REFLEX MICROSCOPIC
Hgb urine dipstick: NEGATIVE
Nitrite: NEGATIVE
Specific Gravity, Urine: 1.024
Urobilinogen, UA: 1

## 2011-10-01 LAB — LIPID PANEL
LDL Cholesterol: 96 mg/dL (ref 0–99)
VLDL: 41 mg/dL — ABNORMAL HIGH (ref 0–40)

## 2011-10-01 LAB — BLOOD GAS, ARTERIAL
Bicarbonate: 23.9
Drawn by: 206361
FIO2: 0.21
O2 Saturation: 98.2
Patient temperature: 98.6
pH, Arterial: 7.421 — ABNORMAL HIGH

## 2011-10-01 LAB — COMPREHENSIVE METABOLIC PANEL
ALT: 18 U/L (ref 0–35)
AST: 16
Albumin: 3.2 g/dL — ABNORMAL LOW (ref 3.5–5.2)
Albumin: 3.9
Alkaline Phosphatase: 77 U/L (ref 39–117)
Alkaline Phosphatase: 56
BUN: 12
GFR calc Af Amer: >60
Glucose, Bld: 104 mg/dL — ABNORMAL HIGH (ref 70–99)
Potassium: 3.5 mEq/L (ref 3.5–5.3)
Potassium: 4
Sodium: 141 mEq/L (ref 135–145)
Total Bilirubin: 0.3 mg/dL (ref 0.3–1.2)
Total Protein: 7 g/dL (ref 6.0–8.3)
Total Protein: 6.3

## 2011-10-01 LAB — POCT I-STAT, CHEM 8
HCT: 43
Hemoglobin: 14.6
Potassium: 3.6
Sodium: 141
TCO2: 25

## 2011-10-01 LAB — BASIC METABOLIC PANEL
BUN: 4 — ABNORMAL LOW
CO2: 27
Calcium: 8.1 — ABNORMAL LOW
Calcium: 8.8
Creatinine, Ser: 0.8
GFR calc Af Amer: >60
GFR calc non Af Amer: >60
GFR calc non Af Amer: >60
Glucose, Bld: 111 — ABNORMAL HIGH
Glucose, Bld: 116 — ABNORMAL HIGH
Glucose, Bld: 96
Potassium: 4.6
Sodium: 136
Sodium: 139

## 2011-10-01 LAB — PROTIME-INR
INR: 2.3 — ABNORMAL HIGH (ref <1.50)
INR: 1
Prothrombin Time: 25.7 seconds — ABNORMAL HIGH (ref 11.6–15.2)

## 2011-10-01 LAB — CBC
HCT: 28.9 — ABNORMAL LOW
HCT: 41.5
Hemoglobin: 10 — ABNORMAL LOW
Hemoglobin: 10.2 — ABNORMAL LOW
MCHC: 34.7
Platelets: 215
RBC: 3.04 — ABNORMAL LOW
RDW: 12.4
RDW: 12.6
RDW: 12.9
RDW: 13

## 2011-10-01 LAB — URINE MICROSCOPIC-ADD ON

## 2011-10-01 LAB — APTT: aPTT: 32

## 2011-10-01 MED ORDER — ASPIRIN 81 MG PO TABS: 81.0000 mg | ORAL_TABLET | Freq: Every day | ORAL | Status: DC

## 2011-10-01 MED ORDER — GABAPENTIN 800 MG PO TABS: ORAL_TABLET | ORAL | Status: DC

## 2011-10-01 MED ORDER — TRAMADOL HCL 50 MG PO TABS: 50.0000 mg | ORAL_TABLET | Freq: Four times a day (QID) | ORAL | Status: DC | PRN

## 2011-10-01 MED ORDER — ATORVASTATIN CALCIUM 40 MG PO TABS: 40.0000 mg | ORAL_TABLET | Freq: Every day | ORAL | Status: DC

## 2011-10-01 MED ORDER — POTASSIUM CHLORIDE 10 MEQ PO TBCR: 8.0000 meq | EXTENDED_RELEASE_TABLET | Freq: Two times a day (BID) | ORAL | Status: DC

## 2011-10-01 NOTE — Assessment & Plan Note (Signed)
We'll check fasting lipid panel and liver studies today.

## 2011-10-01 NOTE — Assessment & Plan Note (Addendum)
We'll check INR today. Have talked to patient about the importance of followup with Dr. Alexandria Lodge.

## 2011-10-01 NOTE — Telephone Encounter (Signed)
Received phone call from Dr. Coralee Pesa who saw the patient in the [facility] today and had a venous INR performed. Patient did not keep her last scheduled INR appointment for follow up after her visit from 08-26-11 at which time her INR was found to be 5.7 on 56.25mg . On that day/date, the patient's dose was HELD/OMITTED, and her weekly regimen was reduced to 52.5mg /wk with instructions to RTC in 14 days. Patient did not keep this RTC appointment but instead comes today. Patient has been called and advised to continue her warfarin as 7.5mg  (one tablet) by mouth daily. RTC on October 28, 2011 at 1000h.

## 2011-10-01 NOTE — Patient Instructions (Signed)
I will see you back in 2 months. I am refilling the medications that need refilled. We are checking blood work today and will let you know if it is abnormal.

## 2011-10-01 NOTE — Progress Notes (Signed)
  Subjective:    Patient ID: Alisha [name], female    DOB: [birth year], 50 y.o.   MRN: [id number]  HPI 50-year-old who comes in for followup of chronic pain on tramadol she saw Dr. Darrick Penna of sports medicine and felt that there is not much that he had to offer. He did see arterial and venous obstruction of the popliteal area wondered if vascular surgery had anything else more that they could offer. Patient was seen in our clinic by one of our internist who gave her Mobic which was not at all helpful.  Patient did have a breast biopsy done in June of 2009 and she is canceled for her mammograms and since that time therefore needs to get one scheduled and followup on that. The breast biopsy was benign.  As the patient was supposed to follow up with Dr. Alexandria Lodge after having an INR back at the end of August of 5.7 on 09/09/2011 and she has not followed up with her she wonders if we can check an INR today.   Review of Systems  Constitutional: Positive for fatigue.  Respiratory: Negative for shortness of breath.   Cardiovascular: Negative for chest pain.  Gastrointestinal: Negative for abdominal pain.   she continues to complain of her chronic bilateral lower extremity pain left greater than right, again she says that she is really not able to do any exercise even on exercise bike in her house because it causes swelling in her lower extremities.     Objective:   Physical Exam  Constitutional: She appears well-developed and well-nourished.  HENT:  Head: Normocephalic and atraumatic.  Neck: No JVD present.  Cardiovascular: Normal rate and regular rhythm.   Pulmonary/Chest: Breath sounds normal.  Abdominal: Bowel sounds are normal.   patient's extremities continue to be diffusely swollen bilaterally there is no warmth in the past patient is diffusely tender throughout both lower extremities from the knee down she also has 2+ edema bilaterally it is mildly pitting.        Assessment & Plan:

## 2011-10-01 NOTE — Telephone Encounter (Signed)
Called patient and instructed her to continue warfarin 7.5mg  PO QD and RTC on Monday 29-OCT-12 at 1000h.

## 2011-10-01 NOTE — Assessment & Plan Note (Signed)
Biopsy was benign however patient has canceled at least 2 followup mammograms for this and we will reschedule this.

## 2011-10-01 NOTE — Assessment & Plan Note (Addendum)
Alisha [name] is status post left femoropopliteal bypass in November of 2009. This graft occluded in December 2009. She also had a right external iliac artery stent placed in the past. I am not sure at this point whether vascular surgery has anything else to offer but will refer her back to her surgeon Dr. Darrick Penna to see if there is anything more that they do have to offer. It may be just that everything in her lower extremities are clotted.

## 2011-10-01 NOTE — Assessment & Plan Note (Signed)
Continue tramadol which is refilled today. Patient has violated pain contract multiple times.

## 2011-10-03 ENCOUNTER — Encounter: Payer: Self-pay | Admitting: Vascular Surgery

## 2011-10-09 ENCOUNTER — Encounter: Payer: Self-pay | Admitting: Vascular Surgery

## 2011-10-10 ENCOUNTER — Ambulatory Visit (INDEPENDENT_AMBULATORY_CARE_PROVIDER_SITE_OTHER): Payer: Medicare Other | Admitting: Vascular Surgery

## 2011-10-10 ENCOUNTER — Encounter: Payer: Self-pay | Admitting: *Deleted

## 2011-10-10 ENCOUNTER — Ambulatory Visit (INDEPENDENT_AMBULATORY_CARE_PROVIDER_SITE_OTHER): Payer: Medicare Other | Admitting: *Deleted

## 2011-10-10 ENCOUNTER — Encounter: Payer: Self-pay | Admitting: Vascular Surgery

## 2011-10-10 VITALS — BP 121/89 | HR 88 | Resp 24 | Ht 64.0 in | Wt 256.0 lb

## 2011-10-10 DIAGNOSIS — I739 Peripheral vascular disease, unspecified: Secondary | ICD-10-CM

## 2011-10-10 NOTE — Progress Notes (Signed)
Addended by: Sharee Pimple on: 10/10/2011 11:18 AM   Modules accepted: Orders

## 2011-10-10 NOTE — Progress Notes (Signed)
[facility] [place] HISTORY AND PHYSICAL   History of Present Illness:  Patient is a 50 y.o. year old female who presents for evaluation of bilateral lower extremity claudication.  The patient previously underwent a left femoropopliteal bypass in 2009 which occluded shortly after placement. She also had problems healing upper left groin wound. Unfortunately she continues to smoke but she has cut back considerably. She was a 2 pack per day smoker and now is only smoking 2 cigarettes per day. She has also gained considerable weight since the last time she was seen. She has also had a previous right external iliac artery stent. Other medical problems include hypertension, hyperlipidemia, and degenertive arthritis.These are currently stable and followed by her primary MD.  She denies rest pain or non healing foot ulcers.  Past Medical History  Diagnosis Date  . DVT (deep venous thrombosis)     bilateral, 2 episodes: Requires lifelong therapy  . Chronic pain syndrome   . Restless leg syndrome   . Depression   . Anxiety   . Hyperlipidemia   . Hypertension   . PVD (peripheral vascular disease)     s/p L fem-pop bypass 11/21/08; graft occluded 12/28/08;  aortogram w/ bilat LE runoff: 1.  bilat diffuse SFA occlusive dz, 2.  Mod to severe above-knee popliteal dz,  3.  Bilat 3-vessel runoff w/ mild tibial occlusive dz.  . Tobacco abuse   . Degenerative joint disease of knee   . Breast lump 02/2008    Biopsy 05/2008: showed no evidence of malignancy  . Irregular menses 9/08  . Pulmonary edema   . Nervousness   . Joint pain   . Leg pain     Past Surgical History  Procedure Date  . Femoral bypass 11/09  .  right external iliac artery stent   . Right breast needle-localized lumpectomy   . Pr vein bypass graft,aorto-fem-pop   . Carotid endarterectomy     ROS: [x]  Positive   [ ]  Negative   [ ]  All sytems reviewed and are negative  General:[ ]  Weight loss, [ ]  Fever, [ ]   chills Neurologic: [x ] Dizziness, [ ]  Blackouts, [ ]  Seizure [ ]  Stroke, [ ]  "Mini stroke", [ ]  Slurred speech, [ ]  Temporary blindness;  [ ] weakness,  [ ]  Hoarseness Cardiac: [ ]  Chest pain/pressure, [ ]  Shortness of breath at rest [ ]  Shortness of breath with exertion,  [ ]   Atrial fibrillation or irregular heartbeat Vascular:[x ] Pain in legs with walking, [ ]  Pain in legs at rest ,[ ]  Pain in legs at night,  [ ]   Non-healing ulcer, [x ] Blood clot in vein/DVT(on lifelong coumadin,   Pulmonary: [ ]  Home oxygen, [ ]   Productive cough, [ ]  Coughing up blood,  [ ]  Asthma,  [ ]  Wheezing Musculoskeletal:  [ ]  Arthritis, [ ]  Low back pain,  [ ]  Joint pain Hematologic:[ ]  Easy Bruising, [ ]  Anemia; [ ]  Hepatitis Gastrointestinal: [ ]  Blood in stool,  [ ]  Gastroesophageal Reflux, [ ]  Trouble swallowing Urinary: [ ]  chronic Kidney disease, [ ]  on HD - [ ]  MWF or [ ]  TTHS, [ ]  Burning with urination, [ ]  Frequent urination, [ ]  Difficulty urinating;  Skin: [ ]  Rashes, [ ]  Wounds Psychological: [ ]  Anxiety,  [ ]  Depression  Social History History  Substance Use Topics  . Smoking status: Current Everyday Smoker -- 0.2 packs/day    Types: Cigarettes  . Smokeless tobacco:  Never Used  . Alcohol Use: No    Family History No family history on file.  Allergies  Allergies  Allergen Reactions  . Propoxyphene N-Acetaminophen      Current Outpatient Prescriptions  Medication Sig Dispense Refill  . albuterol (PROAIR HFA) 108 (90 BASE) MCG/ACT inhaler Inhale 2 puffs into the lungs every 6 (six) hours as needed for wheezing. Take 1-2 puffs as needed every 4-6 hours as needed for shortness of breath  1 Inhaler  3  . amitriptyline (ELAVIL) 25 MG tablet Take 1 tablet (25 mg total) by mouth at bedtime.  30 tablet  0  . amLODipine (NORVASC) 10 MG tablet Take 1 tablet (10 mg total) by mouth daily.  30 tablet  11  . aspirin 81 MG tablet Take 1 tablet (81 mg total) by mouth daily.  100 tablet  3  .  atorvastatin (LIPITOR) 40 MG tablet Take 1 tablet (40 mg total) by mouth daily.  31 tablet  11  . benzonatate (TESSALON PERLES) 100 MG capsule Take 1 capsule (100 mg total) by mouth every 6 (six) hours as needed for cough.  30 capsule  1  . carvedilol (COREG) 25 MG tablet Take 1 tablet (25 mg total) by mouth 2 (two) times daily.  60 tablet  11  . diazepam (VALIUM) 5 MG tablet Take 5 mg by mouth every 8 (eight) hours as needed.        . DULoxetine (CYMBALTA) 60 MG capsule Take 60 mg by mouth. Take 1 capsule in the morning and 1 capsule at bedtime       . furosemide (LASIX) 40 MG tablet Take 1 tablet (40 mg total) by mouth daily.  30 tablet  0  . gabapentin (NEURONTIN) 800 MG tablet Take 1 pill by mouth in the morning, 1 pill by mouth at midday, and 2 pills by mouth at night  120 tablet  11  . hydrochlorothiazide 25 MG tablet Take 25 mg by mouth daily.      Marland Kitchen lisinopril (PRINIVIL,ZESTRIL) 40 MG tablet Take 1 tablet (40 mg total) by mouth daily.  30 tablet  6  . meloxicam (MOBIC) 7.5 MG tablet Take 1 tablet (7.5 mg total) by mouth daily.  30 tablet  0  . potassium chloride (KLOR-CON) 10 MEQ CR tablet Take 1 tablet (10 mEq total) by mouth 2 (two) times daily.  60 tablet  11  . QUEtiapine (SEROQUEL) 100 MG tablet Take 300 mg by mouth at bedtime.       . traMADol (ULTRAM) 50 MG tablet Take 1 tablet (50 mg total) by mouth every 6 (six) hours as needed for pain.  120 tablet  3  . warfarin (COUMADIN) 7.5 MG tablet Take as directed from anticoagulation clinic.  30 tablet  2  . ziprasidone (GEODON) 60 MG capsule Take 60 mg by mouth. Take 1capsule by mouth in the morning and 2 capsules at night        Physical Examination  Vitals 122/89, heart rate 89, sat 96%    Wt 258lbs  General:  Alert and oriented, no acute distress HEENT: Normal Neck: No bruit or JVD Pulmonary: Clear to auscultation bilaterally Cardiac: Regular Rate and Rhythm without murmur Abdomen: Soft, non-tender, non-distended, no mass,  no scars, very obese Skin: No rash Extremity Pulses:  2+ radial, brachial, femoral,absent  dorsalis pedis, posterior tibial pulses bilaterally Musculoskeletal: No deformity or edema  Neurologic: Upper and lower extremity motor 5/5 and symmetric  DATA: ABIs today are 0.66  on the right 0.79 on the left these are unchanged from September of 2010    ASSESSMENT:  Bilateral lower extremity leg pain. Multifactorial in nature. The patient previously had a left leg bypass which was short lived. She currently has claudication symptoms. However she is not a very good bypass candidate due to the fact that she is very obese, she is previously had a left groin infection, she has been noncompliant in the past. She also smoking less continues to abuse tobacco products. His MI last assessment of her in 2010 I would not consider a bypass on her MI she can become more compliant with smoking cessation and a walking program. She also needs to lose weight. If however she diminished to the point where she had limb threatening ischemia we would consider revascularization. However, she currently is not at that point. She will followup with [name] in 6 months time with ABIs for reconsideration.   PLAN:  Fabienne Bruns, MD [facility] [place] Office: [phone] Pager: [phone]

## 2011-10-21 ENCOUNTER — Other Ambulatory Visit: Payer: Self-pay | Admitting: Internal Medicine

## 2011-10-28 ENCOUNTER — Other Ambulatory Visit: Payer: Self-pay | Admitting: *Deleted

## 2011-10-29 MED ORDER — MELOXICAM 7.5 MG PO TABS: 7.5000 mg | ORAL_TABLET | Freq: Every day | ORAL | Status: DC

## 2011-11-29 ENCOUNTER — Other Ambulatory Visit: Payer: Self-pay | Admitting: Internal Medicine

## 2011-12-07 ENCOUNTER — Emergency Department (HOSPITAL_COMMUNITY): Payer: Medicare Other

## 2011-12-07 ENCOUNTER — Other Ambulatory Visit: Payer: Self-pay

## 2011-12-07 ENCOUNTER — Emergency Department (HOSPITAL_COMMUNITY)
Admission: EM | Admit: 2011-12-07 | Discharge: 2011-12-07 | Disposition: A | Discharge status: Home / Self Care | Payer: Medicare Other | Attending: Emergency Medicine | Admitting: Emergency Medicine

## 2011-12-07 ENCOUNTER — Encounter (HOSPITAL_COMMUNITY): Payer: Self-pay | Admitting: *Deleted

## 2011-12-07 DIAGNOSIS — I739 Peripheral vascular disease, unspecified: Secondary | ICD-10-CM | POA: Insufficient documentation

## 2011-12-07 DIAGNOSIS — Z7901 Long term (current) use of anticoagulants: Secondary | ICD-10-CM | POA: Insufficient documentation

## 2011-12-07 DIAGNOSIS — R0989 Other specified symptoms and signs involving the circulatory and respiratory systems: Secondary | ICD-10-CM | POA: Insufficient documentation

## 2011-12-07 DIAGNOSIS — Z79899 Other long term (current) drug therapy: Secondary | ICD-10-CM | POA: Insufficient documentation

## 2011-12-07 DIAGNOSIS — Z86718 Personal history of other venous thrombosis and embolism: Secondary | ICD-10-CM | POA: Insufficient documentation

## 2011-12-07 DIAGNOSIS — I10 Essential (primary) hypertension: Secondary | ICD-10-CM | POA: Insufficient documentation

## 2011-12-07 DIAGNOSIS — I509 Heart failure, unspecified: Secondary | ICD-10-CM | POA: Insufficient documentation

## 2011-12-07 DIAGNOSIS — R0600 Dyspnea, unspecified: Secondary | ICD-10-CM

## 2011-12-07 DIAGNOSIS — R0609 Other forms of dyspnea: Secondary | ICD-10-CM | POA: Insufficient documentation

## 2011-12-07 DIAGNOSIS — F17200 Nicotine dependence, unspecified, uncomplicated: Secondary | ICD-10-CM | POA: Insufficient documentation

## 2011-12-07 DIAGNOSIS — R0602 Shortness of breath: Secondary | ICD-10-CM | POA: Insufficient documentation

## 2011-12-07 LAB — CBC
HCT: 41.5 % (ref 36.0–46.0)
Hemoglobin: 13.6 g/dL (ref 12.0–15.0)
MCV: 99.8 fL (ref 78.0–100.0)
RBC: 4.16 MIL/uL (ref 3.87–5.11)
RDW: 15.6 % — ABNORMAL HIGH (ref 11.5–15.5)
WBC: 8.7 10*3/uL (ref 4.0–10.5)

## 2011-12-07 LAB — DIFFERENTIAL
Eosinophils Relative: 2 % (ref 0–5)
Lymphocytes Relative: 23 % (ref 12–46)
Lymphs Abs: 2 10*3/uL (ref 0.7–4.0)
Monocytes Absolute: 0.8 10*3/uL (ref 0.1–1.0)
Monocytes Relative: 9 % (ref 3–12)
Neutro Abs: 5.7 10*3/uL (ref 1.7–7.7)

## 2011-12-07 LAB — COMPREHENSIVE METABOLIC PANEL
BUN: 9 mg/dL (ref 6–23)
CO2: 28 mEq/L (ref 19–32)
Calcium: 8.8 mg/dL (ref 8.4–10.5)
Chloride: 101 mEq/L (ref 96–112)
Creatinine, Ser: 0.63 mg/dL (ref 0.50–1.10)
GFR calc Af Amer: >90 mL/min (ref >90)
GFR calc non Af Amer: >90 mL/min (ref >90)
Glucose, Bld: 119 mg/dL — ABNORMAL HIGH (ref 70–99)
Total Bilirubin: 0.2 mg/dL — ABNORMAL LOW (ref 0.3–1.2)

## 2011-12-07 LAB — APTT: aPTT: 50 seconds — ABNORMAL HIGH (ref 24–37)

## 2011-12-07 LAB — PROTIME-INR: INR: 2.72 — ABNORMAL HIGH (ref 0.00–1.49)

## 2011-12-07 MED ORDER — IPRATROPIUM BROMIDE 0.02 % IN SOLN
0.5000 mg | RESPIRATORY_TRACT | Status: DC
  Administered 2011-12-07: 0.5 mg via RESPIRATORY_TRACT
  Filled 2011-12-07: qty 2.5

## 2011-12-07 MED ORDER — FUROSEMIDE 40 MG PO TABS: 40.0000 mg | ORAL_TABLET | Freq: Every day | ORAL | Status: DC

## 2011-12-07 MED ORDER — FUROSEMIDE 10 MG/ML IJ SOLN
40.0000 mg | Freq: Once | INTRAMUSCULAR | Status: AC
  Administered 2011-12-07: 40 mg via INTRAVENOUS
  Filled 2011-12-07: qty 4

## 2011-12-07 MED ORDER — ALBUTEROL SULFATE (5 MG/ML) 0.5% IN NEBU
2.5000 mg | INHALATION_SOLUTION | RESPIRATORY_TRACT | Status: DC
  Administered 2011-12-07: 2.5 mg via RESPIRATORY_TRACT
  Filled 2011-12-07: qty 0.5

## 2011-12-07 NOTE — ED Provider Notes (Signed)
History     CSN: [id number] Arrival date & time: 12/07/2011  6:39 AM   First MD Initiated Contact with Patient 12/07/11 [phone]      Chief Complaint  Patient presents with  . Shortness of Breath    pt woke up this am with sob started about 0530    (Consider location/radiation/quality/duration/timing/severity/associated sxs/prior treatment) HPI Comments: Woke from sleep with feeling of shortness of breath, like someone "squeezing the air out of me".  Feels similar to when she had pulmonary edema in the past.  History of blood clots on coumadin.  Smokes.  Patient is a 50 y.o. female presenting with shortness of breath. The history is provided by the patient.  Shortness of Breath  The current episode started today. The problem occurs occasionally. The problem has been unchanged. The problem is moderate. The symptoms are relieved by nothing. The symptoms are aggravated by nothing. Associated symptoms include shortness of breath. Pertinent negatives include no chest pain, no chest pressure, no fever and no cough.    Past Medical History  Diagnosis Date  . DVT (deep venous thrombosis)     bilateral, 2 episodes: Requires lifelong therapy  . Chronic pain syndrome   . Restless leg syndrome   . Depression   . Anxiety   . Hyperlipidemia   . Hypertension   . PVD (peripheral vascular disease)     s/p L fem-pop bypass 11/21/08; graft occluded 12/28/08;  aortogram w/ bilat LE runoff: 1.  bilat diffuse SFA occlusive dz, 2.  Mod to severe above-knee popliteal dz,  3.  Bilat 3-vessel runoff w/ mild tibial occlusive dz.  . Tobacco abuse   . Degenerative joint disease of knee   . Breast lump 02/2008    Biopsy 05/2008: showed no evidence of malignancy  . Irregular menses 9/08  . Pulmonary edema   . Nervousness   . Joint pain   . Leg pain     Past Surgical History  Procedure Date  . Femoral bypass 11/09  .  right external iliac artery stent   . Right breast needle-localized lumpectomy   . Pr  vein bypass graft,aorto-fem-pop   . Carotid endarterectomy     No family history on file.  History  Substance Use Topics  . Smoking status: Current Everyday Smoker -- 0.2 packs/day    Types: Cigarettes  . Smokeless tobacco: Never Used  . Alcohol Use: No    OB History    Grav Para Term Preterm Abortions TAB SAB Ect Mult Living                  Review of Systems  Constitutional: Negative for fever.  Respiratory: Positive for shortness of breath. Negative for cough.   Cardiovascular: Negative for chest pain.  All other systems reviewed and are negative.    Allergies  Propoxyphene n-acetaminophen  Home Medications   Current Outpatient Rx  Name Route Sig Dispense Refill  . ALBUTEROL SULFATE HFA 108 (90 BASE) MCG/ACT IN AERS Inhalation Inhale 2 puffs into the lungs every 6 (six) hours as needed for wheezing. Take 1-2 puffs as needed every 4-6 hours as needed for shortness of breath 1 Inhaler 3  . AMITRIPTYLINE HCL 25 MG PO TABS Oral Take 1 tablet (25 mg total) by mouth at bedtime. 30 tablet 0  . AMLODIPINE BESYLATE 10 MG PO TABS Oral Take 1 tablet (10 mg total) by mouth daily. 30 tablet 11  . ASPIRIN 81 MG PO TABS Oral Take 1 tablet (81  mg total) by mouth daily. 100 tablet 3  . ATORVASTATIN CALCIUM 40 MG PO TABS Oral Take 1 tablet (40 mg total) by mouth daily. 31 tablet 11  . BENZONATATE 100 MG PO CAPS Oral Take 1 capsule (100 mg total) by mouth every 6 (six) hours as needed for cough. 30 capsule 1  . CARVEDILOL 25 MG PO TABS Oral Take 1 tablet (25 mg total) by mouth 2 (two) times daily. 60 tablet 11  . DIAZEPAM 5 MG PO TABS Oral Take 5 mg by mouth every 8 (eight) hours as needed.      . DULOXETINE HCL 60 MG PO CPEP Oral Take 60 mg by mouth. Take 1 capsule in the morning and 1 capsule at bedtime     . FUROSEMIDE 40 MG PO TABS Oral Take 1 tablet (40 mg total) by mouth daily. 30 tablet 0  . GABAPENTIN 800 MG PO TABS  Take 1 pill by mouth in the morning, 1 pill by mouth at midday,  and 2 pills by mouth at night 120 tablet 11  . HYDROCHLOROTHIAZIDE 25 MG PO TABS Oral Take 25 mg by mouth daily.    Marland Kitchen LISINOPRIL 40 MG PO TABS Oral Take 1 tablet (40 mg total) by mouth daily. 30 tablet 6  . MELOXICAM 7.5 MG PO TABS Oral Take 1 tablet (7.5 mg total) by mouth daily. 30 tablet 0  . QUETIAPINE FUMARATE 100 MG PO TABS Oral Take 450 mg by mouth at bedtime. 4 and a 1/2 tabs    . TRAMADOL HCL 50 MG PO TABS Oral Take 1 tablet (50 mg total) by mouth every 6 (six) hours as needed for pain. 120 tablet 3  . WARFARIN SODIUM 7.5 MG PO TABS  TAKE AS DIRECTED FROM ANTICOAGULATION CLINIC 30 tablet 11  . ZIPRASIDONE HCL 60 MG PO CAPS Oral Take 60 mg by mouth 2 (two) times daily with a meal.       BP 178/101  Pulse 92  Temp(Src) 98.9 F (37.2 C) (Oral)  Resp 20  Ht 5\' 4"  (1.626 m)  Wt 205 lb (92.987 kg)  BMI 35.19 kg/m2  SpO2 99%  LMP 11/27/2011  Physical Exam  Nursing note and vitals reviewed. Constitutional: She is oriented to person, place, and time. She appears well-developed and well-nourished. No distress.  HENT:  Head: Normocephalic and atraumatic.  Neck: Normal range of motion. Neck supple.  Cardiovascular: Normal rate and regular rhythm.  Exam reveals no gallop and no friction rub.   No murmur heard. Pulmonary/Chest: Effort normal and breath sounds normal. No respiratory distress. She has no wheezes.  Abdominal: Soft. Bowel sounds are normal. She exhibits no distension. There is no tenderness.  Musculoskeletal: Normal range of motion.  Neurological: She is alert and oriented to person, place, and time.  Skin: Skin is warm and dry. She is not diaphoretic.    ED Course  Procedures (including critical care time)   Labs Reviewed  CBC  DIFFERENTIAL  COMPREHENSIVE METABOLIC PANEL  PROTIME-INR  APTT  CK TOTAL AND CKMB  TROPONIN I  PRO B NATRIURETIC PEPTIDE   No results found.   No diagnosis found.   Date: 12/07/2011  Rate: 91  Rhythm: normal sinus rhythm  QRS  Axis: normal  Intervals: normal  ST/T Wave abnormalities: normal  Conduction Disutrbances:none  Narrative Interpretation:   Old EKG Reviewed: unchanged    MDM  Labs look okay.  After lasix, bp has improved, patient feels more comfortable.  Will discharge to  home with lasix, return prn.        Geoffery Lyons, MD 12/07/11 [phone]

## 2011-12-07 NOTE — ED Notes (Signed)
B/P 141/87  Pulse Ox 97%--c/o swollen hands--unable to get her rings off---explained to her may take a little bit longer for her to have complete resolution of swelling but, her b/p and 02 were already improving.

## 2011-12-07 NOTE — ED Notes (Signed)
Voided another 700 cc clear lt. Yellow urine--Total output has been 1800 ml--Reports 100% improvement in breathing status.

## 2011-12-07 NOTE — ED Notes (Signed)
Pt in room 24 with friend at bedside.

## 2011-12-07 NOTE — ED Notes (Signed)
Voided 600 ml yellow clear urine---voices decrease in SOA

## 2011-12-07 NOTE — ED Notes (Signed)
Pt with #22 in right hand per ems.

## 2011-12-07 NOTE — ED Notes (Signed)
Voided again 500 ml light yellow clear urine.

## 2011-12-07 NOTE — ED Notes (Signed)
IV 22 gauge right hand d/cd---catheter intact

## 2011-12-07 NOTE — ED Notes (Signed)
YNW:GN56<OZ> Expected date:12/07/11<BR> Expected time: 6:34 AM<BR> Means of arrival:Ambulance<BR> Comments:<BR> SOB

## 2011-12-09 ENCOUNTER — Encounter: Payer: Self-pay | Admitting: Internal Medicine

## 2011-12-09 ENCOUNTER — Ambulatory Visit (INDEPENDENT_AMBULATORY_CARE_PROVIDER_SITE_OTHER): Payer: Medicare Other | Admitting: Pharmacist

## 2011-12-09 ENCOUNTER — Ambulatory Visit (INDEPENDENT_AMBULATORY_CARE_PROVIDER_SITE_OTHER): Payer: Medicare Other | Admitting: Internal Medicine

## 2011-12-09 ENCOUNTER — Telehealth: Payer: Self-pay | Admitting: *Deleted

## 2011-12-09 VITALS — BP 119/81 | HR 88 | Temp 97.3°F | Ht 64.0 in | Wt 260.7 lb

## 2011-12-09 DIAGNOSIS — I82409 Acute embolism and thrombosis of unspecified deep veins of unspecified lower extremity: Secondary | ICD-10-CM

## 2011-12-09 DIAGNOSIS — Z7901 Long term (current) use of anticoagulants: Secondary | ICD-10-CM

## 2011-12-09 DIAGNOSIS — I10 Essential (primary) hypertension: Secondary | ICD-10-CM

## 2011-12-09 DIAGNOSIS — G894 Chronic pain syndrome: Secondary | ICD-10-CM

## 2011-12-09 LAB — POCT INR: INR: 3.3

## 2011-12-09 MED ORDER — LIDOCAINE 5 % EX PTCH: 1.0000 | MEDICATED_PATCH | CUTANEOUS | Status: DC

## 2011-12-09 NOTE — Progress Notes (Signed)
  Subjective:    Patient ID: Alisha [name], female    DOB: 1961-09-11, 50 y.o.   MRN: [id number]  HPI Alisha [name] is a 50 year old woman with past with history of severe PVD-followed by vascular surgery, knee pain, chronic pain, hypertension who comes to the clinic with chief complaint of bilateral lower extremity pain. She has claudication pain from her PVD. She has had femoropopliteal bypass in 2009- which occluded in month.  She saw Dr. Darrick Penna- her vascular surgeon- in October 2012 and had ABI which showed ratio of 0.66 on right side and 0.89 on left side- which is almost same as the one done before 2 years. She has moderate PVD- without any significant ischemic changes. She was deemed to be a bad surgical candidate and so would not have any bypass surgery until she has limb threatening emergency.  She complains of this chronic pain and is tearful. She says the pain is at rest and gets worse with any activity. 10 out of 10 at its worse. She says that she bounces back from ER to clinic and back to ER and no one treats her pain appropriately. She wants prescription for narcotic pain medication- looking at her previous visit notes with Dr. Coralee Pesa- she has violated pain contract on 4 different occasions- and so is not prescribed anything than  Tramadol.  She denies any fever, chills, nausea, vomiting, abdominal pain, chest pain, short of breath.    Review of Systems    as per history of present illness, all other systems reviewed and negative. Objective:   Physical Exam  Vitals: Reviewed and stable. General: Tearful due to pain. HEENT: PERRL, EOMI, no scleral icterus Cardiac: S1, S2, RRR, no rubs, murmurs or gallops Pulm: clear to auscultation bilaterally, moving normal volumes of air Abd: soft, nontender, nondistended, BS present Ext: No tenderness to palpation in thighs or calf. Scar from previous surgery on medial side of left leg. Neuro: alert and oriented X3, cranial nerves II-XII  grossly intact, strength and sensation to light touch equal in bilateral upper and lower extremities       Assessment & Plan:

## 2011-12-09 NOTE — Patient Instructions (Signed)
Patient instructed to take medications as defined in the Anti-coagulation Track section of this encounter.  Patient instructed to take today's dose.  Patient verbalized understanding of these instructions.    

## 2011-12-09 NOTE — Telephone Encounter (Signed)
Please tell her that I don't want her on this as she is on Coumadin. She can come in to be seen and we can discuss something else.

## 2011-12-09 NOTE — Telephone Encounter (Signed)
Pt is requesting a refill on Meloxicam 7.5 mg # 30.  Last refill was 10/29/2011. Angelina Ok, RN 12/ 09/2011 3:28 PM.

## 2011-12-09 NOTE — Progress Notes (Signed)
Anti-Coagulation Progress Note  Alisha [name] is a 50 y.o. female who is currently on an anti-coagulation regimen.    RECENT RESULTS: Recent results are below, the most recent result is correlated with a dose of 52.5 mg. per week: Lab Results  Component Value Date   INR 3.30 12/09/2011   INR 2.72* 12/07/2011   INR 2.30* 10/01/2011    ANTI-COAG DOSE:   Latest dosing instructions   Total Sun Mon Tue Wed Thu Fri Sat   48.75 7.5 mg 3.75 mg 7.5 mg 7.5 mg 7.5 mg 7.5 mg 7.5 mg    (7.5 mg1) (7.5 mg0.5) (7.5 mg1) (7.5 mg1) (7.5 mg1) (7.5 mg1) (7.5 mg1)         ANTICOAG SUMMARY: Anticoagulation Episode Summary              Current INR goal 2.0-3.0 Next INR check 01/06/2012   INR from last check 3.30! (12/09/2011)     Weekly max dose (mg)  Target end date    Indications DVT, Long term current use of anticoagulant   INR check location [facility]erred [facility]    Send INR reminders to [facility]   Comments        Provider Role Specialty Phone number   Levada Schilling [facility]  Internal Medicine [phone]        ANTICOAG TODAY: Anticoagulation Summary as of 12/09/2011              INR goal 2.0-3.0     Selected INR 3.30! (12/09/2011) Next INR check 01/06/2012   Weekly max dose (mg)  Target end date    Indications DVT, Long term current use of anticoagulant    Anticoagulation Episode Summary              INR check location [facility]erred lab    Send INR reminders to [facility]   Comments        Provider Role Specialty Phone number   Levada Schilling [facility]  Internal Medicine [phone]        PATIENT INSTRUCTIONS: Patient Instructions  Patient instructed to take medications as defined in the Anti-coagulation Track section of this encounter.  Patient instructed to take today's dose.  Patient verbalized understanding of these instructions.        FOLLOW-UP Return in 4 weeks (on 01/06/2012) for Follow up INR.  Hulen Luster, III Pharm.D., CACP

## 2011-12-09 NOTE — Assessment & Plan Note (Signed)
Her pain seems to be claudication in nature from her PVD. Due to her previous history of violation of pain contract and repetitive denial of narcotics by Dr. Coralee Pesa who is her primary care- I looked in her charts for past 12 months. I discussed this with her and with Dr. Rogelia Boga- and told her that I will not give her any narcotic medication and she has to rediscuss this with Dr. Coralee Pesa. I did offer her lidocaine patches- for symptom relief- as she is on Neurontin and NSAIDs and tramadol. She was happy with offer- and I will prescribe 30 patches of 5% lidocaine. She'll make an appointment as needed.

## 2011-12-09 NOTE — Patient Instructions (Signed)
Please make a followup appointment with your primary care doctor as soon as possible for discussing your pain management. I will give you lidocaine 5% patch- apply one patch and keep it for 12 hours. Please take all your medications regularly. Followup with Dr. Alexandria Lodge as recommended.

## 2011-12-09 NOTE — Assessment & Plan Note (Signed)
Lab Results  Component Value Date   NA 137 12/07/2011   K 4.3 12/07/2011   CL 101 12/07/2011   CO2 28 12/07/2011   BUN 9 12/07/2011   CREATININE 0.63 12/07/2011   CREATININE 0.68 10/01/2011    BP Readings from Last 3 Encounters:  12/09/11 119/81  12/07/11 140/79  10/10/11 121/89    Assessment: Hypertension control:  controlled  Progress toward goals:  at goal Barriers to meeting goals:  no barriers identified  Plan: Hypertension treatment:  continue current medications

## 2011-12-10 ENCOUNTER — Telehealth: Payer: Self-pay | Admitting: *Deleted

## 2011-12-10 NOTE — Telephone Encounter (Signed)
Call to [employer]-Prior Authorization was approved for Lidoderm 5% patches.  Approved 11-19-2011 thru 12-09-2012.  Pt was called and informed of.  Angelina Ok, RN 12/10/2011 9:45 AM.

## 2011-12-19 ENCOUNTER — Telehealth: Payer: Self-pay | Admitting: *Deleted

## 2011-12-19 NOTE — Telephone Encounter (Signed)
Lidoderm Adh Patch approved - letter from [facility] 11/19/2011 to 12/09/2012. Pt aware. Stanton Kidney Alyx Mcguirk RN 12/19/11 9:15AM

## 2012-01-06 ENCOUNTER — Ambulatory Visit: Payer: Medicare Other

## 2012-01-13 ENCOUNTER — Other Ambulatory Visit: Payer: Self-pay | Admitting: Internal Medicine

## 2012-01-17 ENCOUNTER — Other Ambulatory Visit: Payer: Self-pay | Admitting: *Deleted

## 2012-01-17 DIAGNOSIS — G894 Chronic pain syndrome: Secondary | ICD-10-CM

## 2012-01-17 MED ORDER — LIDOCAINE 5 % EX PTCH: 1.0000 | MEDICATED_PATCH | CUTANEOUS | Status: DC

## 2012-01-21 ENCOUNTER — Other Ambulatory Visit: Payer: Self-pay | Admitting: *Deleted

## 2012-01-22 DIAGNOSIS — F411 Generalized anxiety disorder: Secondary | ICD-10-CM | POA: Diagnosis not present

## 2012-01-22 DIAGNOSIS — F333 Major depressive disorder, recurrent, severe with psychotic symptoms: Secondary | ICD-10-CM | POA: Diagnosis not present

## 2012-01-29 NOTE — Telephone Encounter (Signed)
Empty request 

## 2012-02-05 ENCOUNTER — Ambulatory Visit (INDEPENDENT_AMBULATORY_CARE_PROVIDER_SITE_OTHER): Payer: Medicare Other | Admitting: Internal Medicine

## 2012-02-05 ENCOUNTER — Encounter: Payer: Self-pay | Admitting: Internal Medicine

## 2012-02-05 VITALS — BP 126/82 | HR 93 | Temp 97.5°F | Wt 277.0 lb

## 2012-02-05 DIAGNOSIS — M25569 Pain in unspecified knee: Secondary | ICD-10-CM

## 2012-02-05 DIAGNOSIS — I82409 Acute embolism and thrombosis of unspecified deep veins of unspecified lower extremity: Secondary | ICD-10-CM

## 2012-02-05 DIAGNOSIS — I10 Essential (primary) hypertension: Secondary | ICD-10-CM

## 2012-02-05 DIAGNOSIS — F17200 Nicotine dependence, unspecified, uncomplicated: Secondary | ICD-10-CM | POA: Diagnosis not present

## 2012-02-05 DIAGNOSIS — Z7901 Long term (current) use of anticoagulants: Secondary | ICD-10-CM

## 2012-02-05 LAB — POCT INR: INR: 3.4

## 2012-02-05 NOTE — Progress Notes (Signed)
Subjective:   Patient ID: Alisha [name] female   DOB: 04/15/1961 51 y.o.   MRN: [id number]  HPI: [name] is a 51 y.o. woman with PMH significant for PVD (s/p fem pop occlusion) & Chronic pain syndrome that presents today to request percocet for b/l LE/knee pain.  She is teary and tells me she has only violated her pain contract once.  She continues to smoke.  Decreased appetite. Admits to weight gain due to inactivity, which she attributes to pain.  Has tried all the interventions we have suggested.  She has no other complaints besides pain.  She denies SOB, and reports compliance with coumadin.  She tells me she hasn't come to her INR follow up appts because it is difficult for her to get around.  Past Medical History  Diagnosis Date  . DVT (deep venous thrombosis)     bilateral, 2 episodes: Requires lifelong therapy  . Chronic pain syndrome   . Restless leg syndrome   . Depression   . Anxiety   . Hyperlipidemia   . Hypertension   . PVD (peripheral vascular disease)     s/p L fem-pop bypass 11/21/08; graft occluded 12/28/08;  aortogram w/ bilat LE runoff: 1.  bilat diffuse SFA occlusive dz, 2.  Mod to severe above-knee popliteal dz,  3.  Bilat 3-vessel runoff w/ mild tibial occlusive dz.  . Tobacco abuse   . Degenerative joint disease of knee   . Breast lump 02/2008    Biopsy 05/2008: showed no evidence of malignancy  . Irregular menses 9/08  . Pulmonary edema   . Nervousness   . Joint pain   . Leg pain    Current Outpatient Prescriptions  Medication Sig Dispense Refill  . albuterol (PROAIR HFA) 108 (90 BASE) MCG/ACT inhaler Inhale 2 puffs into the lungs every 6 (six) hours as needed for wheezing. Take 1-2 puffs as needed every 4-6 hours as needed for shortness of breath  1 Inhaler  3  . amLODipine (NORVASC) 10 MG tablet Take 1 tablet (10 mg total) by mouth daily.  30 tablet  11  . aspirin 81 MG tablet Take 1 tablet (81 mg total) by mouth daily.  100 tablet  3  .  atorvastatin (LIPITOR) 40 MG tablet Take 1 tablet (40 mg total) by mouth daily.  31 tablet  11  . benzonatate (TESSALON PERLES) 100 MG capsule Take 1 capsule (100 mg total) by mouth every 6 (six) hours as needed for cough.  30 capsule  1  . carvedilol (COREG) 25 MG tablet Take 1 tablet (25 mg total) by mouth 2 (two) times daily.  60 tablet  11  . diazepam (VALIUM) 5 MG tablet Take 5 mg by mouth every 8 (eight) hours as needed.        . DULoxetine (CYMBALTA) 60 MG capsule Take 60 mg by mouth. Take 1 capsule in the morning and 1 capsule at bedtime       . furosemide (LASIX) 40 MG tablet Take 1 tablet (40 mg total) by mouth daily.  30 tablet  0  . gabapentin (NEURONTIN) 800 MG tablet Take 1 pill by mouth in the morning, 1 pill by mouth at midday, and 2 pills by mouth at night  120 tablet  11  . hydrochlorothiazide (HYDRODIURIL) 25 MG tablet TAKE ONE TABLET BY MOUTH ONE TIME DAILY  30 tablet  0  . lidocaine (LIDODERM) 5 % Place 1 patch onto the skin daily. Remove & Discard patch  within 12 hours or as directed by MD  30 patch  0  . lisinopril (PRINIVIL,ZESTRIL) 40 MG tablet Take 1 tablet (40 mg total) by mouth daily.  30 tablet  6  . QUEtiapine (SEROQUEL) 100 MG tablet Take 450 mg by mouth at bedtime. 4 and a 1/2 tabs      . traMADol (ULTRAM) 50 MG tablet Take 1 tablet (50 mg total) by mouth every 6 (six) hours as needed for pain.  120 tablet  3  . warfarin (COUMADIN) 7.5 MG tablet TAKE AS DIRECTED FROM ANTICOAGULATION CLINIC  30 tablet  11  . ziprasidone (GEODON) 60 MG capsule Take 60 mg by mouth 2 (two) times daily with a meal.        No family history on file. History   Social History  . Marital Status: Divorced    Spouse Name: N/A    Number of Children: N/A  . Years of Education: N/A   Social History Main Topics  . Smoking status: Current Everyday Smoker -- 0.5 packs/day    Types: Cigarettes  . Smokeless tobacco: Never Used  . Alcohol Use: No  . Drug Use: No  . Sexually Active: Not on  file   Other Topics Concern  . Not on file   Social History Narrative   Financial assistance application initiated. Patient needs to submit further paperwork to [facility]  September 11, 2010 2:04 PMFinancial assistance approved for 100% discount at [facility] and [facility]  October 04, 2010 5:29 PM   Review of Systems: General: no fevers, chills Skin: no rash HEENT: no blurry vision, hearing changes, sore throat Pulm: no dyspnea, coughing, wheezing CV: no chest pain, palpitations, shortness of breath Abd: no abdominal pain, nausea/vomiting, diarrhea/constipation GU: no dysuria, hematuria, polyuria Ext: no arthralgias, myalgias Neuro: no weakness, numbness, or tingling  Objective:  Physical Exam: Filed Vitals:   02/05/12 1628  BP: 126/82  Pulse: 93  Temp: 97.5 F (36.4 C)  TempSrc: Oral  Weight: 277 lb (125.646 kg)   Constitutional: Vital signs reviewed.  Patient is a morbidly obese woman in no acute distress and cooperative with exam.   Cardiovascular: RRR, S1 normal, S2 normal, no MRG, pulses symmetric and intact bilaterally Pulmonary/Chest: CTAB, no wheezes, rales, or rhonchi Abdominal: Soft. Obese. Non-tender, non-distended, bowel sounds are normal, no masses, organomegaly, or guarding present.  Musculoskeletal: Normal ROM of b/l knees.  B/l LE tender to palpation.   Neurological: A&O x3  Assessment & Plan:  Case and care discussed with Dr. Rogelia Boga.  Patient to return in 1 month for INR check and discussion of pain mgmt.  Please see problem oriented charting for further detail.

## 2012-02-05 NOTE — Patient Instructions (Signed)
-Please continue to hold coumadin dose on Monday as you have been doing.  Also, please take half a tablet of coumadin (3.75mg ) on Wednesday and Friday. -Return in 1 month for INR check -I will be reviewing your file to decide if we can prescribe percocet to you in the future.  We will discuss this in one month as well. -Please try to work on quitting smoking. -If you have new or worsening symptoms, please call the clinic at [phone].  Thanks!  Smoking Cessation This document explains the best ways for you to quit smoking and new treatments to help. It lists new medicines that can double or triple your chances of quitting and quitting for good. It also considers ways to avoid relapses and concerns you may have about quitting, including weight gain. NICOTINE: A POWERFUL ADDICTION If you have tried to quit smoking, you know how hard it can be. It is hard because nicotine is a very addictive drug. For some people, it can be as addictive as heroin or cocaine. Usually, people make 2 or 3 tries, or more, before finally being able to quit. Each time you try to quit, you can learn about what helps and what hurts. Quitting takes hard work and a lot of effort, but you can quit smoking. QUITTING SMOKING IS ONE OF THE MOST IMPORTANT THINGS YOU WILL EVER DO.  You will live longer, feel better, and live better.   The impact on your body of quitting smoking is felt almost immediately:   Within 20 minutes, blood pressure decreases. Pulse returns to its normal level.   After 8 hours, carbon monoxide levels in the blood return to normal. Oxygen level increases.   After 24 hours, chance of heart attack starts to decrease. Breath, hair, and body stop smelling like smoke.   After 48 hours, damaged nerve endings begin to recover. Sense of taste and smell improve.   After 72 hours, the body is virtually free of nicotine. Bronchial tubes relax and breathing becomes easier.   After 2 to 12 weeks, lungs can hold  more air. Exercise becomes easier and circulation improves.   Quitting will reduce your risk of having a heart attack, stroke, cancer, or lung disease:   After 1 year, the risk of coronary heart disease is cut in half.   After 5 years, the risk of stroke falls to the same as a nonsmoker.   After 10 years, the risk of lung cancer is cut in half and the risk of other cancers decreases significantly.   After 15 years, the risk of coronary heart disease drops, usually to the level of a nonsmoker.   If you are pregnant, quitting smoking will improve your chances of having a healthy baby.   The people you live with, especially your children, will be healthier.   You will have extra money to spend on things other than cigarettes.  FIVE KEYS TO QUITTING Studies have shown that these 5 steps will help you quit smoking and quit for good. You have the best chances of quitting if you use them together: 1. Get ready.  2. Get support and encouragement.  3. Learn new skills and behaviors.  4. Get medicine to reduce your nicotine addiction and use it correctly.  5. Be prepared for relapse or difficult situations. Be determined to continue trying to quit, even if you do not succeed at first.  1. GET READY  Set a quit date.   Change your environment.   Get  rid of ALL cigarettes, ashtrays, matches, and lighters in your home, car, and place of work.   Do not let people smoke in your home.   Review your past attempts to quit. Think about what worked and what did not.   Once you quit, do not smoke. NOT EVEN A PUFF!  2. GET SUPPORT AND ENCOURAGEMENT Studies have shown that you have a better chance of being successful if you have help. You can get support in many ways.  Tell your family, friends, and coworkers that you are going to quit and need their support. Ask them not to smoke around you.   Talk to your caregivers (doctor, dentist, nurse, pharmacist, psychologist, and/or smoking counselor).     Get individual, group, or telephone counseling and support. The more counseling you have, the better your chances are of quitting. Programs are available at [employer] and health centers. Call your local health department for information about programs in your area.   Spiritual beliefs and practices may help some smokers quit.   Quit meters are Photographer that keep track of quit statistics, such as amount of "quit-time," cigarettes not smoked, and money saved.   Many smokers find one or more of the many self-help books available useful in helping them quit and stay off tobacco.  3. LEARN NEW SKILLS AND BEHAVIORS  Try to distract yourself from urges to smoke. Talk to someone, go for a walk, or occupy your time with a task.   When you first try to quit, change your routine. Take a different route to work. Drink tea instead of coffee. Eat breakfast in a different place.   Do something to reduce your stress. Take a hot bath, exercise, or read a book.   Plan something enjoyable to do every day. Reward yourself for not smoking.   Explore interactive web-based programs that specialize in helping you quit.  4. GET MEDICINE AND USE IT CORRECTLY Medicines can help you stop smoking and decrease the urge to smoke. Combining medicine with the above behavioral methods and support can quadruple your chances of successfully quitting smoking. The U.S. Food and Drug Administration (FDA) has approved 7 medicines to help you quit smoking. These medicines fall into 3 categories.  Nicotine replacement therapy (delivers nicotine to your body without the negative effects and risks of smoking):   Nicotine gum: Available over-the-counter.   Nicotine lozenges: Available over-the-counter.   Nicotine inhaler: Available by prescription.   Nicotine nasal spray: Available by prescription.   Nicotine skin patches (transdermal): Available by prescription and  over-the-counter.   Antidepressant medicine (helps people abstain from smoking, but how this works is unknown):   Bupropion sustained-release (SR) tablets: Available by prescription.   Nicotinic receptor partial agonist (simulates the effect of nicotine in your brain):   Varenicline tartrate tablets: Available by prescription.   Ask your caregiver for advice about which medicines to use and how to use them. Carefully read the information on the package.   Everyone who is trying to quit may benefit from using a medicine. If you are pregnant or trying to become pregnant, nursing an infant, you are under age 16, or you smoke fewer than 10 cigarettes per day, talk to your caregiver before taking any nicotine replacement medicines.   You should stop using a nicotine replacement product and call your caregiver if you experience nausea, dizziness, weakness, vomiting, fast or irregular heartbeat, mouth problems with the lozenge or gum, or redness or swelling  of the skin around the patch that does not go away.   Do not use any other product containing nicotine while using a nicotine replacement product.   Talk to your caregiver before using these products if you have diabetes, heart disease, asthma, stomach ulcers, you had a recent heart attack, you have high blood pressure that is not controlled with medicine, a history of irregular heartbeat, or you have been prescribed medicine to help you quit smoking.  5. BE PREPARED FOR RELAPSE OR DIFFICULT SITUATIONS  Most relapses occur within the first 3 months after quitting. Do not be discouraged if you start smoking again. Remember, most people try several times before they finally quit.   You may have symptoms of withdrawal because your body is used to nicotine. You may crave cigarettes, be irritable, feel very hungry, cough often, get headaches, or have difficulty concentrating.   The withdrawal symptoms are only temporary. They are strongest when you  first quit, but they will go away within 10 to 14 days.  Here are some difficult situations to watch for:  Alcohol. Avoid drinking alcohol. Drinking lowers your chances of successfully quitting.   Caffeine. Try to reduce the amount of caffeine you consume. It also lowers your chances of successfully quitting.   Other smokers. Being around smoking can make you want to smoke. Avoid smokers.   Weight gain. Many smokers will gain weight when they quit, usually less than 10 pounds. Eat a healthy diet and stay active. Do not let weight gain distract you from your main goal, quitting smoking. Some medicines that help you quit smoking may also help delay weight gain. You can always lose the weight gained after you quit.   Bad mood or depression. There are a lot of ways to improve your mood other than smoking.  If you are having problems with any of these situations, talk to your caregiver. SPECIAL SITUATIONS AND CONDITIONS Studies suggest that everyone can quit smoking. Your situation or condition can give you a special reason to quit.  Pregnant women/new mothers: By quitting, you protect your baby's health and your own.   Hospitalized patients: By quitting, you reduce health problems and help healing.   Heart attack patients: By quitting, you reduce your risk of a second heart attack.   Lung, head, and neck cancer patients: By quitting, you reduce your chance of a second cancer.   Parents of children and adolescents: By quitting, you protect your children from illnesses caused by secondhand smoke.  QUESTIONS TO THINK ABOUT Think about the following questions before you try to stop smoking. You may want to talk about your answers with your caregiver.  Why do you want to quit?   If you tried to quit in the past, what helped and what did not?   What will be the most difficult situations for you after you quit? How will you plan to handle them?   Who can help you through the tough times? Your  family? Friends? Caregiver?   What pleasures do you get from smoking? What ways can you still get pleasure if you quit?  Here are some questions to ask your caregiver:  How can you help me to be successful at quitting?   What medicine do you think would be best for me and how should I take it?   What should I do if I need more help?   What is smoking withdrawal like? How can I get information on withdrawal?  Quitting takes  hard work and a lot of effort, but you can quit smoking. FOR MORE INFORMATION  Smokefree.gov (http://www.davis-sullivan.com/) provides free, accurate, evidence-based information and professional assistance to help support the immediate and long-term needs of people trying to quit smoking. Document Released: 12/10/2001 Document Revised: 08/28/2011 Document Reviewed: 10/02/2009 [facility] Patient Information 2012 [place], [place].

## 2012-02-06 ENCOUNTER — Encounter: Payer: Self-pay | Admitting: Internal Medicine

## 2012-02-06 NOTE — Assessment & Plan Note (Signed)
INR supratherapuetic today.  Patient has been holding Monday dose.  I asked that she cut Wednesday and Friday dose in half (3.75mg ).  We will recheck INR in 1 month.

## 2012-02-06 NOTE — Assessment & Plan Note (Signed)
Patient continues to complain of b/l knee pain.  Physical exam does not suggest infectious etiology; no effusion, erythema.  Past XR suggest degenerative changes. She has been to sports medicine and notes that lidoderm patch, steroid injections & tramadol don't help.  She requests percocet, "that is the only thing that helps." She admits to violating pain contract once in past (filling Klonopin at two pharmacies), but FYI documentation suggests that she has violated contract on numerous occassions.  I discussed this with patient, and that this is why our clinic is no longer prescribing opiates to her.  I told her that I will gather all the information in our system and discuss her pain with her old PCP, and from there we can determine if there will be a change in her pain regimen.  For now, she will have to continue current regimen.  She will return in 1 month.

## 2012-02-06 NOTE — Assessment & Plan Note (Signed)
Lab Results  Component Value Date   NA 137 12/07/2011   K 4.3 12/07/2011   CL 101 12/07/2011   CO2 28 12/07/2011   BUN 9 12/07/2011   CREATININE 0.63 12/07/2011   CREATININE 0.68 10/01/2011    BP Readings from Last 3 Encounters:  02/05/12 126/82  12/09/11 119/81  12/07/11 140/79    Assessment: Hypertension control:  controlled  Progress toward goals:  at goal Barriers to meeting goals:  no barriers identified  Plan: Hypertension treatment:  continue current medications

## 2012-02-06 NOTE — Assessment & Plan Note (Signed)
Continues to smoke.  We discussed how smoking contributes to her PVD and worsening symptoms.  She reports understanding this, but I do not think she is seriously considering cessation at this time.

## 2012-02-10 ENCOUNTER — Other Ambulatory Visit: Payer: Self-pay | Admitting: *Deleted

## 2012-02-10 MED ORDER — TRAMADOL HCL 50 MG PO TABS: 50.0000 mg | ORAL_TABLET | Freq: Four times a day (QID) | ORAL | Status: DC | PRN

## 2012-02-15 ENCOUNTER — Encounter: Payer: Self-pay | Admitting: Internal Medicine

## 2012-02-15 DIAGNOSIS — M170 Bilateral primary osteoarthritis of knee: Secondary | ICD-10-CM | POA: Insufficient documentation

## 2012-02-19 DIAGNOSIS — F411 Generalized anxiety disorder: Secondary | ICD-10-CM | POA: Diagnosis not present

## 2012-02-19 DIAGNOSIS — F333 Major depressive disorder, recurrent, severe with psychotic symptoms: Secondary | ICD-10-CM | POA: Diagnosis not present

## 2012-02-27 ENCOUNTER — Other Ambulatory Visit: Payer: Self-pay | Admitting: Internal Medicine

## 2012-03-04 ENCOUNTER — Ambulatory Visit (INDEPENDENT_AMBULATORY_CARE_PROVIDER_SITE_OTHER): Payer: Medicare Other | Admitting: Internal Medicine

## 2012-03-04 ENCOUNTER — Encounter: Payer: Self-pay | Admitting: Internal Medicine

## 2012-03-04 ENCOUNTER — Other Ambulatory Visit (HOSPITAL_COMMUNITY)
Admission: RE | Admit: 2012-03-04 | Discharge: 2012-03-04 | Disposition: A | Discharge status: Home / Self Care | Payer: Medicare Other | Source: Ambulatory Visit | Attending: Internal Medicine | Admitting: Internal Medicine

## 2012-03-04 VITALS — BP 129/91 | HR 88 | Temp 97.2°F | Wt 277.2 lb

## 2012-03-04 DIAGNOSIS — M1710 Unilateral primary osteoarthritis, unspecified knee: Secondary | ICD-10-CM

## 2012-03-04 DIAGNOSIS — N898 Other specified noninflammatory disorders of vagina: Secondary | ICD-10-CM | POA: Diagnosis not present

## 2012-03-04 DIAGNOSIS — G894 Chronic pain syndrome: Secondary | ICD-10-CM

## 2012-03-04 DIAGNOSIS — Z113 Encounter for screening for infections with a predominantly sexual mode of transmission: Secondary | ICD-10-CM | POA: Diagnosis not present

## 2012-03-04 MED ORDER — TRAMADOL HCL 50 MG PO TABS: 50.0000 mg | ORAL_TABLET | Freq: Four times a day (QID) | ORAL | Status: DC | PRN

## 2012-03-04 MED ORDER — SULINDAC 200 MG PO TABS: 200.0000 mg | ORAL_TABLET | Freq: Two times a day (BID) | ORAL | Status: DC

## 2012-03-04 NOTE — Progress Notes (Signed)
  Subjective:    Patient ID: Alisha [name], female    DOB: 30-Aug-1961, 51 y.o.   MRN: [id number]  HPI Patient is here today to discuss her pain medications.  She was seen by her PCP Dr. Milbert Coulter 3 weeks ago when they discussed about the controlled medication and contract and it was decided that Dr. Milbert Coulter would look closer into the pain contract and her past violations as listed by Dr. Coralee Pesa.  Patient was crying and moaning and literally begging for forgiveness for breaking the controlled medication contract. She admitted that she broke the controlled medication contract for her nerve medications many times but was under the impression that nerve medications like valium, klonopin and xanax were not part of controlled medications contract.   Patient also complained of foul odor and whitish vaginal discharge since last 1 week.  No other complaints today.    Review of Systems  All other systems reviewed and are negative.       Objective:   Physical Exam  Constitutional: She is oriented to person, place, and time. She appears well-developed and well-nourished.  HENT:  Head: Normocephalic and atraumatic.  Eyes: Conjunctivae and EOM are normal. Pupils are equal, round, and reactive to light. No scleral icterus.  Neck: Normal range of motion. Neck supple. No JVD present. No thyromegaly present.  Cardiovascular: Normal rate, regular rhythm, normal heart sounds and intact distal pulses.  Exam reveals no gallop and no friction rub.   No murmur heard. Pulmonary/Chest: Effort normal and breath sounds normal. No respiratory distress. She has no wheezes. She has no rales.  Abdominal: Soft. Bowel sounds are normal. She exhibits no distension and no mass. There is no tenderness. There is no rebound and no guarding.  Genitourinary:       Patient was menstruating and had bleeding all around her vaginal walls. Cusco's speculum was used to visualize the cervical os and GC, Chlamydia and wet prep was  sent. Mild cervical tender was noted on the right vaginal wall while performing bimanual exam.  Musculoskeletal: Normal range of motion. She exhibits no edema and no tenderness.       Patient had multiple skin marks suggestive of previous surgeries.  Lymphadenopathy:    She has no cervical adenopathy.  Neurological: She is alert and oriented to person, place, and time.  Psychiatric: She has a normal mood and affect. Her behavior is normal.          Assessment & Plan:

## 2012-03-04 NOTE — Assessment & Plan Note (Addendum)
GC and chlamydia was sent from the cervical specimen collected during exam. Wet prep was also sent. I would call her with a prescription tomorrow morning based on the results. Patient was having her menstrual bleeding at this time and we were not able to send the specimen for KOH or direct microscopy as the specimen would have been inconclusive at this time.

## 2012-03-04 NOTE — Assessment & Plan Note (Signed)
Given that patient admitted to breaking the controlled medication contract which was already well documented by Dr. Coralee Pesa, we did not prescribe her any narcotics today. She was crying in pain and begged Dr. Rogelia Boga for forgiveness. After discussing the clinic policy with her, we came to a conclusion to prescribe her NASIDS for pain along with tramadol. Patient was prescribed sulindac and was given a refill of tramadol. She was also given a referral to orthopedic surgery for evaluation for bilateral knee replacement as requested by her. Patient was instructed to be seen in 1 month by her PCP or as needed. Patient voiced understanding with the plan.

## 2012-03-04 NOTE — Patient Instructions (Signed)
Knee Pain The knee is the complex joint between your thigh and your lower leg. It is made up of bones, tendons, ligaments, and cartilage. The bones that make up the knee are:  The femur in the thigh.   The tibia and fibula in the lower leg.   The patella or kneecap riding in the groove on the lower femur.  CAUSES  Knee pain is a common complaint with many causes. A few of these causes are:  Injury, such as:   A ruptured ligament or tendon injury.   Torn cartilage.   Medical conditions, such as:   Gout   Arthritis   Infections   Overuse, over training or overdoing a physical activity.  Knee pain can be minor or severe. Knee pain can accompany debilitating injury. Minor knee problems often respond well to self-care measures or get well on their own. More serious injuries may need medical intervention or even surgery. SYMPTOMS The knee is complex. Symptoms of knee problems can vary widely. Some of the problems are:  Pain with movement and weight bearing.   Swelling and tenderness.   Buckling of the knee.   Inability to straighten or extend your knee.   Your knee locks and you cannot straighten it.   Warmth and redness with pain and fever.   Deformity or dislocation of the kneecap.  DIAGNOSIS  Determining what is wrong may be very straight forward such as when there is an injury. It can also be challenging because of the complexity of the knee. Tests to make a diagnosis may include:  Your caregiver taking a history and doing a physical exam.   Routine X-rays can be used to rule out other problems. X-rays will not reveal a cartilage tear. Some injuries of the knee can be diagnosed by:   Arthroscopy a surgical technique by which a small video camera is inserted through tiny incisions on the sides of the knee. This procedure is used to examine and repair internal knee joint problems. Tiny instruments can be used during arthroscopy to repair the torn knee cartilage  (meniscus).   Arthrography is a radiology technique. A contrast liquid is directly injected into the knee joint. Internal structures of the knee joint then become visible on X-ray film.   An MRI scan is a non x-ray radiology procedure in which magnetic fields and a computer produce two- or three-dimensional images of the inside of the knee. Cartilage tears are often visible using an MRI scanner. MRI scans have largely replaced arthrography in diagnosing cartilage tears of the knee.   Blood work.   Examination of the fluid that helps to lubricate the knee joint (synovial fluid). This is done by taking a sample out using a needle and a syringe.  TREATMENT The treatment of knee problems depends on the cause. Some of these treatments are:  Depending on the injury, proper casting, splinting, surgery or physical therapy care will be needed.   Give yourself adequate recovery time. Do not overuse your joints. If you begin to get sore during workout routines, back off. Slow down or do fewer repetitions.   For repetitive activities such as cycling or running, maintain your strength and nutrition.   Alternate muscle groups. For example if you are a weight lifter, work the upper body on one day and the lower body the next.   Either tight or weak muscles do not give the proper support for your knee. Tight or weak muscles do not absorb the stress placed   on the knee joint. Keep the muscles surrounding the knee strong.   Take care of mechanical problems.   If you have flat feet, orthotics or special shoes may help. See your caregiver if you need help.   Arch supports, sometimes with wedges on the inner or outer aspect of the heel, can help. These can shift pressure away from the side of the knee most bothered by osteoarthritis.   A brace called an "unloader" brace also may be used to help ease the pressure on the most arthritic side of the knee.   If your caregiver has prescribed crutches, braces,  wraps or ice, use as directed. The acronym for this is PRICE. This means protection, rest, ice, compression and elevation.   Nonsteroidal anti-inflammatory drugs (NSAID's), can help relieve pain. But if taken immediately after an injury, they may actually increase swelling. Take NSAID's with food in your stomach. Stop them if you develop stomach problems. Do not take these if you have a history of ulcers, stomach pain or bleeding from the bowel. Do not take without your caregiver's approval if you have problems with fluid retention, heart failure, or kidney problems.   For ongoing knee problems, physical therapy may be helpful.   Glucosamine and chondroitin are over-the-counter dietary supplements. Both may help relieve the pain of osteoarthritis in the knee. These medicines are different from the usual anti-inflammatory drugs. Glucosamine may decrease the rate of cartilage destruction.   Injections of a corticosteroid drug into your knee joint may help reduce the symptoms of an arthritis flare-up. They may provide pain relief that lasts a few months. You may have to wait a few months between injections. The injections do have a small increased risk of infection, water retention and elevated blood sugar levels.   Hyaluronic acid injected into damaged joints may ease pain and provide lubrication. These injections may work by reducing inflammation. A series of shots may give relief for as long as 6 months.   Topical painkillers. Applying certain ointments to your skin may help relieve the pain and stiffness of osteoarthritis. Ask your pharmacist for suggestions. Many over the-counter products are approved for temporary relief of arthritis pain.   In some countries, doctors often prescribe topical NSAID's for relief of chronic conditions such as arthritis and tendinitis. A review of treatment with NSAID creams found that they worked as well as oral medications but without the serious side effects.    PREVENTION  Maintain a healthy weight. Extra pounds put more strain on your joints.   Get strong, stay limber. Weak muscles are a common cause of knee injuries. Stretching is important. Include flexibility exercises in your workouts.   Be smart about exercise. If you have osteoarthritis, chronic knee pain or recurring injuries, you may need to change the way you exercise. This does not mean you have to stop being active. If your knees ache after jogging or playing basketball, consider switching to swimming, water aerobics or other low-impact activities, at least for a few days a week. Sometimes limiting high-impact activities will provide relief.   Make sure your shoes fit well. Choose footwear that is right for your sport.   Protect your knees. Use the proper gear for knee-sensitive activities. Use kneepads when playing volleyball or laying carpet. Buckle your seat belt every time you drive. Most shattered kneecaps occur in car accidents.   Rest when you are tired.  SEEK MEDICAL CARE IF:  You have knee pain that is continual and does not   seem to be getting better.  SEEK IMMEDIATE MEDICAL CARE IF:  Your knee joint feels hot to the touch and you have a high fever. MAKE SURE YOU:   Understand these instructions.   Will watch your condition.   Will get help right away if you are not doing well or get worse.  Document Released: 10/13/2007 Document Revised: 12/05/2011 Document Reviewed: 10/13/2007 [employer] Patient Information 2012 [employer]. 

## 2012-03-05 NOTE — Assessment & Plan Note (Addendum)
Patient was seen for controlled medication prescription for her leg pain. She started crying and demanded pain medications. We had a discussion regarding the inability to prescribe narcotic pain medications at this time and would only be able to offer non narcotic pain medications like NSAIDS and tramadol. After repeatedly telling her the clinic policy, she requested to talk to the clinic medical director. Dr. Rogelia Boga talked to the patient regarding the clinic policy of not prescribing narcotics if controlled medication contract is broken.  I prescribed her 120 pills of tramadol 50 along with sulindac 200 mg tab for her pain control. Patient was asked to follow up in clinic as needed. She aslo requested an appointment with orthopedic surgeon for evaluation of her arthritis. We gave her an appointment with orthopedics.

## 2012-03-06 ENCOUNTER — Other Ambulatory Visit: Payer: Self-pay | Admitting: Internal Medicine

## 2012-03-06 ENCOUNTER — Telehealth: Payer: Self-pay | Admitting: Internal Medicine

## 2012-03-06 ENCOUNTER — Encounter: Payer: Self-pay | Admitting: *Deleted

## 2012-03-06 MED ORDER — METRONIDAZOLE 500 MG PO TABS: 500.0000 mg | ORAL_TABLET | Freq: Two times a day (BID) | ORAL | Status: AC

## 2012-03-06 NOTE — Telephone Encounter (Signed)
Patient was called and informed about her vaginal specimen results. Patient was still complaining of itching and discharge and since we could not check her for BV or trichomonas due to her being in menstruation, I will give her a 7 day course of flagyl 500 mg for 7 days to treat BV which is the most common reason of discharge in this age group.

## 2012-03-06 NOTE — Telephone Encounter (Signed)
A user error has taken place: encounter opened in error, closed for administrative reasons.

## 2012-03-09 DIAGNOSIS — M1710 Unilateral primary osteoarthritis, unspecified knee: Secondary | ICD-10-CM | POA: Diagnosis not present

## 2012-03-18 DIAGNOSIS — F333 Major depressive disorder, recurrent, severe with psychotic symptoms: Secondary | ICD-10-CM | POA: Diagnosis not present

## 2012-03-18 DIAGNOSIS — F411 Generalized anxiety disorder: Secondary | ICD-10-CM | POA: Diagnosis not present

## 2012-03-30 ENCOUNTER — Ambulatory Visit: Payer: Medicare Other | Attending: Orthopaedic Surgery | Admitting: Rehabilitative and Restorative Service Providers"

## 2012-04-01 ENCOUNTER — Other Ambulatory Visit: Payer: Self-pay | Admitting: *Deleted

## 2012-04-01 DIAGNOSIS — G894 Chronic pain syndrome: Secondary | ICD-10-CM

## 2012-04-01 MED ORDER — LIDOCAINE 5 % EX PTCH: 1.0000 | MEDICATED_PATCH | CUTANEOUS | Status: AC

## 2012-04-08 ENCOUNTER — Other Ambulatory Visit: Payer: Self-pay | Admitting: *Deleted

## 2012-04-08 MED ORDER — HYDROCHLOROTHIAZIDE 25 MG PO TABS: 25.0000 mg | ORAL_TABLET | Freq: Every day | ORAL | Status: DC

## 2012-04-09 DIAGNOSIS — F333 Major depressive disorder, recurrent, severe with psychotic symptoms: Secondary | ICD-10-CM | POA: Diagnosis not present

## 2012-04-09 DIAGNOSIS — F411 Generalized anxiety disorder: Secondary | ICD-10-CM | POA: Diagnosis not present

## 2012-04-14 ENCOUNTER — Encounter: Payer: Self-pay | Admitting: Neurosurgery

## 2012-04-16 ENCOUNTER — Ambulatory Visit: Payer: Medicare Other | Admitting: Neurosurgery

## 2012-04-22 ENCOUNTER — Encounter: Payer: Self-pay | Admitting: Neurosurgery

## 2012-04-23 ENCOUNTER — Encounter: Payer: Self-pay | Admitting: Neurosurgery

## 2012-04-23 ENCOUNTER — Ambulatory Visit (INDEPENDENT_AMBULATORY_CARE_PROVIDER_SITE_OTHER): Payer: Medicare Other | Admitting: Neurosurgery

## 2012-04-23 ENCOUNTER — Encounter (INDEPENDENT_AMBULATORY_CARE_PROVIDER_SITE_OTHER): Payer: Medicare Other | Admitting: *Deleted

## 2012-04-23 VITALS — BP 114/82 | HR 79 | Resp 16 | Ht 64.0 in | Wt 265.7 lb

## 2012-04-23 DIAGNOSIS — Z48812 Encounter for surgical aftercare following surgery on the circulatory system: Secondary | ICD-10-CM

## 2012-04-23 DIAGNOSIS — I739 Peripheral vascular disease, unspecified: Secondary | ICD-10-CM | POA: Diagnosis not present

## 2012-04-23 NOTE — Progress Notes (Signed)
Addended by: Sharee Pimple on: 04/23/2012 03:23 PM   Modules accepted: Orders

## 2012-04-23 NOTE — Progress Notes (Signed)
[facility]ISTORY [facility]YS[facility]   CC: Six-month serial lower arterial ABIs Referring Physician: [facility]  History of Present Illness: This 51 year old patient of Dr. Darrick Penna with known lower extremity claudication symptoms. Patient has a remote history of having had a left common femoral endarterectomy in November 2009 a right EEA I. stent  also in November 2009. Patient reports lower extremity claudication symptoms as she has previously. She does not report rest pain however she does state that her legs "ache" with walking any distance.  Past Medical History  Diagnosis Date  . DVT (deep venous thrombosis)     bilateral, 2 episodes: Requires lifelong therapy  . Chronic pain syndrome   . Restless leg syndrome   . Depression   . Anxiety   . Hyperlipidemia   . Hypertension   . PVD (peripheral vascular disease)     s/p L fem-pop bypass 11/21/08; graft occluded 12/28/08;  aortogram w/ bilat LE runoff: 1.  bilat diffuse SFA occlusive dz, 2.  Mod to severe above-knee popliteal dz,  3.  Bilat 3-vessel runoff w/ mild tibial occlusive dz.  . Tobacco abuse   . Degenerative joint disease of knee   . Breast lump 02/2008    Biopsy 05/2008: showed no evidence of malignancy  . Irregular menses 9/08  . Pulmonary edema   . Nervousness   . Joint pain   . Leg pain     ROS: [x]  Positive   [ ]  Denies    General: [ ]  Weight loss, [ ]  Fever, [ ]  chills Neurologic: [ ]  Dizziness, [ ]  Blackouts, [ ]  Seizure [ ]  Stroke, [ ]  "Mini stroke", [ ]  Slurred speech, [ ]  Temporary blindness; [ ]  weakness in arms or legs, [ ]  Hoarseness Cardiac: [ ]  Chest pain/pressure, [ ]  Shortness of breath at rest [ ]  Shortness of breath with exertion, [ ]  Atrial fibrillation or irregular heartbeat Vascular: [ ]  Pain in legs with walking, [ ]  Pain in legs at rest, [ ]  Pain in legs at night,  [ ]  Non-healing ulcer, [ ]  Blood clot in vein/DVT,   Pulmonary: [ ]  Home oxygen, [ ]  Productive cough, [ ]   Coughing up blood, [ ]  Asthma,  [ ]  Wheezing Musculoskeletal:  [ ]  Arthritis, [ ]  Low back pain, [ ]  Joint pain Hematologic: [ ]  Easy Bruising, [ ]  Anemia; [ ]  Hepatitis Gastrointestinal: [ ]  Blood in stool, [ ]  Gastroesophageal Reflux/heartburn, [ ]  Trouble swallowing Urinary: [ ]  chronic Kidney disease, [ ]  on HD - [ ]  MWF or [ ]  TTHS, [ ]  Burning with urination, [ ]  Difficulty urinating Skin: [ ]  Rashes, [ ]  Wounds Psychological: [ ]  Anxiety, [ ]  Depression   Social History History  Substance Use Topics  . Smoking status: Current Everyday Smoker -- 0.5 packs/day    Types: Cigarettes  . Smokeless tobacco: Never Used  . Alcohol Use: No    Family History History reviewed. No pertinent family history.  Allergies  Allergen Reactions  . Propoxacet-N Swelling    Current Outpatient Prescriptions  Medication Sig Dispense Refill  . albuterol (PROAIR HFA) 108 (90 BASE) MCG/ACT inhaler Inhale 2 puffs into the lungs every 6 (six) hours as needed for wheezing. Take 1-2 puffs as needed every 4-6 hours as needed for shortness of breath  1 Inhaler  3  . amLODipine (NORVASC) 10 MG tablet Take 1 tablet (10 mg total) by mouth daily.  30 tablet  11  . aspirin  81 MG tablet Take 1 tablet (81 mg total) by mouth daily.  100 tablet  3  . atorvastatin (LIPITOR) 40 MG tablet Take 1 tablet (40 mg total) by mouth daily.  31 tablet  11  . carvedilol (COREG) 25 MG tablet Take 1 tablet (25 mg total) by mouth 2 (two) times daily.  60 tablet  11  . diazepam (VALIUM) 5 MG tablet Take 5 mg by mouth every 8 (eight) hours as needed.        . DULoxetine (CYMBALTA) 60 MG capsule Take 60 mg by mouth. Take 1 capsule in the morning and 1 capsule at bedtime       . furosemide (LASIX) 40 MG tablet Take 1 tablet (40 mg total) by mouth daily.  30 tablet  0  . gabapentin (NEURONTIN) 800 MG tablet Take 1 pill by mouth in the morning, 1 pill by mouth at midday, and 2 pills by mouth at night  120 tablet  11  .  hydrochlorothiazide (HYDRODIURIL) 25 MG tablet Take 1 tablet (25 mg total) by mouth daily.  30 tablet  5  . lidocaine (LIDODERM) 5 % Place 1 patch onto the skin daily. Remove & Discard patch within 12 hours or as directed by MD  30 patch  6  . lisinopril (PRINIVIL,ZESTRIL) 40 MG tablet Take 1 tablet (40 mg total) by mouth daily.  30 tablet  6  . sulindac (CLINORIL) 200 MG tablet Take 1 tablet (200 mg total) by mouth 2 (two) times daily.  60 tablet  1  . traMADol (ULTRAM) 50 MG tablet Take 1 tablet (50 mg total) by mouth every 6 (six) hours as needed for pain.  120 tablet  1  . warfarin (COUMADIN) 7.5 MG tablet TAKE AS DIRECTED FROM ANTICOAGULATION CLINIC  30 tablet  11  . ziprasidone (GEODON) 60 MG capsule Take 60 mg by mouth 2 (two) times daily with a meal.       . QUEtiapine (SEROQUEL) 100 MG tablet Take 450 mg by mouth at bedtime. 4 and a 1/2 tabs        Physical Examination  Filed Vitals:   04/23/12 1153  BP: 114/82  Pulse: 79  Resp: 16    Body mass index is 45.61 kg/(m^2).  General:  WDWN in NAD Gait: Normal HEENT: WNL Eyes: Pupils equal Pulmonary: normal non-labored breathing , without Rales, rhonchi,  wheezing Cardiac: RRR, without  Murmurs, rubs or gallops; No carotid bruits Abdomen: soft, NT, no masses Skin: no rashes, ulcers noted Vascular Exam/Pulses: There are no palpable pulses in the lower extremities, she does have 2+ radial pulses  Extremities without ischemic changes, no Gangrene , no cellulitis; no open wounds;  Musculoskeletal: no muscle wasting or atrophy  Neurologic: A&O X 3; Appropriate Affect ; SENSATION: normal; MOTOR FUNCTION:  moving all extremities equally. Speech is fluent/normal  Non-Invasive Vascular Imaging: ABI today is 0.61 on the right 0.7 on the left which is virtually unchanged from previous exam  ASSESSMENT/PLAN: Assessment as above, plan I discussed the ABI with Dr. Darrick Penna who states the patient should be monitored on six-month rotation  therefore she'll come back in 6 months for repeat lower extremity ABIs and to be seen in my clinic.  Lauree Chandler ANP  Clinic M.D.: [facility]

## 2012-05-08 ENCOUNTER — Encounter: Payer: Self-pay | Admitting: Internal Medicine

## 2012-05-08 ENCOUNTER — Other Ambulatory Visit (HOSPITAL_COMMUNITY)
Admission: RE | Admit: 2012-05-08 | Discharge: 2012-05-08 | Disposition: A | Discharge status: Home / Self Care | Payer: Medicare Other | Source: Ambulatory Visit | Attending: Internal Medicine | Admitting: Internal Medicine

## 2012-05-08 ENCOUNTER — Ambulatory Visit (INDEPENDENT_AMBULATORY_CARE_PROVIDER_SITE_OTHER): Payer: Medicare Other | Admitting: Internal Medicine

## 2012-05-08 VITALS — BP 119/81 | HR 81 | Temp 97.1°F | Wt 269.5 lb

## 2012-05-08 DIAGNOSIS — N760 Acute vaginitis: Secondary | ICD-10-CM

## 2012-05-08 DIAGNOSIS — IMO0002 Reserved for concepts with insufficient information to code with codable children: Secondary | ICD-10-CM

## 2012-05-08 DIAGNOSIS — A64 Unspecified sexually transmitted disease: Secondary | ICD-10-CM | POA: Diagnosis not present

## 2012-05-08 DIAGNOSIS — I10 Essential (primary) hypertension: Secondary | ICD-10-CM | POA: Diagnosis not present

## 2012-05-08 DIAGNOSIS — M1710 Unilateral primary osteoarthritis, unspecified knee: Secondary | ICD-10-CM

## 2012-05-08 DIAGNOSIS — Z1159 Encounter for screening for other viral diseases: Secondary | ICD-10-CM

## 2012-05-08 DIAGNOSIS — F329 Major depressive disorder, single episode, unspecified: Secondary | ICD-10-CM

## 2012-05-08 DIAGNOSIS — I82409 Acute embolism and thrombosis of unspecified deep veins of unspecified lower extremity: Secondary | ICD-10-CM | POA: Diagnosis not present

## 2012-05-08 DIAGNOSIS — F3289 Other specified depressive episodes: Secondary | ICD-10-CM

## 2012-05-08 DIAGNOSIS — Z7251 High risk heterosexual behavior: Secondary | ICD-10-CM | POA: Diagnosis not present

## 2012-05-08 DIAGNOSIS — N898 Other specified noninflammatory disorders of vagina: Secondary | ICD-10-CM

## 2012-05-08 DIAGNOSIS — F17200 Nicotine dependence, unspecified, uncomplicated: Secondary | ICD-10-CM

## 2012-05-08 DIAGNOSIS — N63 Unspecified lump in unspecified breast: Secondary | ICD-10-CM

## 2012-05-08 DIAGNOSIS — Z124 Encounter for screening for malignant neoplasm of cervix: Secondary | ICD-10-CM | POA: Diagnosis not present

## 2012-05-08 DIAGNOSIS — M170 Bilateral primary osteoarthritis of knee: Secondary | ICD-10-CM

## 2012-05-08 DIAGNOSIS — Z113 Encounter for screening for infections with a predominantly sexual mode of transmission: Secondary | ICD-10-CM | POA: Diagnosis not present

## 2012-05-08 DIAGNOSIS — Z1239 Encounter for other screening for malignant neoplasm of breast: Secondary | ICD-10-CM

## 2012-05-08 DIAGNOSIS — B373 Candidiasis of vulva and vagina: Secondary | ICD-10-CM

## 2012-05-08 DIAGNOSIS — B3731 Acute candidiasis of vulva and vagina: Secondary | ICD-10-CM

## 2012-05-08 DIAGNOSIS — B9689 Other specified bacterial agents as the cause of diseases classified elsewhere: Secondary | ICD-10-CM

## 2012-05-08 DIAGNOSIS — Z114 Encounter for screening for human immunodeficiency virus [HIV]: Secondary | ICD-10-CM

## 2012-05-08 DIAGNOSIS — N819 Female genital prolapse, unspecified: Secondary | ICD-10-CM | POA: Diagnosis not present

## 2012-05-08 LAB — BASIC METABOLIC PANEL
CO2: 29 mEq/L (ref 19–32)
Calcium: 9 mg/dL (ref 8.4–10.5)
Chloride: 102 mEq/L (ref 96–112)
Creat: 0.78 mg/dL (ref 0.50–1.10)
Sodium: 139 mEq/L (ref 135–145)

## 2012-05-08 LAB — HIV ANTIBODY (ROUTINE TESTING W REFLEX): HIV: NONREACTIVE

## 2012-05-08 LAB — PROTIME-INR: INR: 2.45 — ABNORMAL HIGH (ref <1.50)

## 2012-05-08 MED ORDER — SALSALATE 750 MG PO TABS: 1500.0000 mg | ORAL_TABLET | Freq: Two times a day (BID) | ORAL | Status: DC

## 2012-05-08 MED ORDER — RIVAROXABAN 20 MG PO TABS: 20.0000 mg | ORAL_TABLET | Freq: Every day | ORAL | Status: DC

## 2012-05-08 NOTE — Assessment & Plan Note (Signed)
Patient agreeable to mammogram. Referral placed.

## 2012-05-08 NOTE — Assessment & Plan Note (Signed)
Patient being followed by Dr. Carolanne Grumbling at [facility]. She denies suicidal and homicidal ideation at this time. She should continue medications at her prescribed to her by Dr. Ladona Ridgel.

## 2012-05-08 NOTE — Assessment & Plan Note (Addendum)
Pap smear done today

## 2012-05-08 NOTE — Patient Instructions (Signed)
-  Please try taking salsalate 1500 mg twice daily for your knee pain. You may continue your tramadol. I have referred you to pain clinic.  -I will call you with the results of your Pap smear.  -Please discontinue Coumadin. Start Rivaroxaban 2 hours prior to the next dose of Coumadin that he would've taken. If your insurance does not cover her for Oxy 10 please call me as soon as possible so that I can restart your Coumadin  Please be sure to bring all of your medications with you to every visit.  Should you have any new or worsening symptoms, please be sure to call the clinic at [phone].

## 2012-05-08 NOTE — Assessment & Plan Note (Addendum)
Patient has been followed by sports medicine and vascular regarding knee and leg pain. Per chart review patient [facility]. She is not eligible for opiates at this time. She tells me that sulindac was not effective. For this reason we will try salsalate 15 mg twice daily. I will also refer her to pain clinic.  Dr. Rogelia Boga performed chart reviewed that revealed the following: Dr Coralee Pesa denied further controlled substances on 02/06/11 for "repeated violations." The violations were not mentioned. 11/06/10 note from Dr Lacretia Nicks said 3rd UDS negative for benzos and opioids despite being Rx'd those meds. 12/18/10 note said that on questioning pt was running out of meds just prior to the UDS so the negative UDS might not have been violation. UDS that visit with expected results. 02/06/2011 note stated patient's mental health provider told Dr Lacretia Nicks that pt had violated her medication contract by getting benzos from both [facility] and mental health."Patient had lied to mental health telling them I had stopped filling her benzodiazepines back in November of 2011."Dr Lacretia Nicks called and told pt to taper off opioids and benzos and [facility] would no longer prescribe these meds.   [facility] currently not prescribing any controlled meds.

## 2012-05-08 NOTE — Assessment & Plan Note (Signed)
Patient is not compliant with Coumadin clinic appointment. She tells me that she cannot come to the clinic that regularly for INR checks. For this reason we will transition her rivaroxaban.  I instructed her to take this medication 2 hours prior to when she would take her next Coumadin dose. I am starting her 20 mg daily. I am transitioning to this medication, as it does not require regular monitoring. I emphasized the importance of her calling and letting us know if she cannot afford this medication, as we'll need to switch her back to Coumadin in order to prevent recurrent DVTs.  -Check INR today

## 2012-05-08 NOTE — Assessment & Plan Note (Signed)
Patient continues to smoke. I do not believe that she is interested in smoking cessation at this time. Patient was counseled.

## 2012-05-08 NOTE — Assessment & Plan Note (Signed)
Lab Results  Component Value Date   NA 137 12/07/2011   K 4.3 12/07/2011   CL 101 12/07/2011   CO2 28 12/07/2011   BUN 9 12/07/2011   CREATININE 0.63 12/07/2011   CREATININE 0.68 10/01/2011    BP Readings from Last 3 Encounters:  05/08/12 119/81  04/23/12 114/82  03/04/12 129/91    Assessment: Hypertension control:  controlled  Progress toward goals:  at goal Barriers to meeting goals:  no barriers identified  Plan: Hypertension treatment:  continue current medications

## 2012-05-08 NOTE — Assessment & Plan Note (Signed)
On physical exam, it was difficult to discern vaginal tissue from organ prolapse.  She tells me that she has had accidents and feels that her organs are prolapsing. She also notes urinary accidents. For this reason I will refer her to urology.

## 2012-05-08 NOTE — Progress Notes (Signed)
  Subjective:   Patient ID: Alisha [name] female   DOB: 03-Mar-1961 51 y.o.   MRN: [id number]  HPI: [name] is a 51 y.o. woman with past medical history significant for peripheral vascular disease, history of DVTs, restless leg syndrome, chronic pain syndrome, DJD, depression, and anxiety. She is here today for Pap smear and to request pain medications for her legs.  There multiple contributing factors to her leg pain, and she is frustrated that she is unable to get medication from anybody. She requests Percocet. She is broken pain contract several times per chart review, therefore she is not eligible for opiates from this clinic.  She requests referral to pain clinic, and she believes that she made a mistake years ago and would like to be treated with Percocet. She was most recently seen by vascular surgery on April 25, and the plan was to repeat ABI in 6 months. At her last appointment here, she was given sulindac and tramadol, which she believes are not helping. Her pain begins in her knees and goes down to her legs bilaterally. There has been no change in the type of pain. She does note that because she remains in pain, her number of falls or increasing. She fell yesterday and notes swelling and redness of her knees, and an injury on her right lower leg.  Reguarding or depression she is being followed by Dr. Ladona Ridgel of [facility]. She denies suicidal or homicidal ideation, but feels that she is depressed because of her leg pain.  Review of Systems: General: no fevers, chills, changes in weight, changes in appetite Skin: no rash HEENT: no blurry vision, hearing changes, sore throat Pulm: no dyspnea, coughing, wheezing CV: no chest pain, palpitations, shortness of breath Abd: no abdominal pain, nausea/vomiting, diarrhea/constipation GU: no dysuria, hematuria, polyuria; she does note vaginal odor without pruritus Ext: As per history of present illness Neuro: no weakness, numbness, or  tingling   Objective:  Physical Exam: Filed Vitals:   05/08/12 0933  BP: 119/81  Pulse: 81  Temp: 97.1 F (36.2 C)  TempSrc: Oral  Weight: 269 lb 8 oz (122.244 kg)   Constitutional: Vital signs reviewed.  Patient is an obese woman in no acute distress and cooperative with exam. She becomes teary-eyed with discussion of pain medications Mouth: no erythema or exudates, MMM, poor dentition Eyes: PERRL, EOMI, conjunctivae normal, No scleral icterus.  Cardiovascular: RRR, S1 normal, S2 normal, no MRG, pulses symmetric and intact bilaterally, nonpitting edema of bilateral lower extremities Pulmonary/Chest: CTAB, no wheezes, rales, or rhonchi Abdominal: Soft. Non-tender, non-distended, bowel sounds are normal, no masses, organomegaly, or guarding present.  GU: No visible discharge on pelvic exam, there is cervical motion tenderness, no obvious external abnormalities, question of prolapse versus vaginal tissue  Musculoskeletal: No erythema or traumatic swelling of bilateral knees, bandage on the right shin stained with blood  Neurological: A&O x3   Assessment & Plan:    Case and care discussed with Dr. Josem Kaufmann. Patient to return in 3 months for hypertension check. Please see problem-oriented charting for further details

## 2012-05-12 MED ORDER — METRONIDAZOLE 500 MG PO TABS: 500.0000 mg | ORAL_TABLET | Freq: Two times a day (BID) | ORAL | Status: AC

## 2012-05-12 MED ORDER — FLUCONAZOLE 150 MG PO TABS: 150.0000 mg | ORAL_TABLET | Freq: Once | ORAL | Status: AC

## 2012-05-12 NOTE — Progress Notes (Signed)
Addended by: Alanson Puls on: 05/12/2012 05:34 PM   Modules accepted: Orders

## 2012-05-12 NOTE — Assessment & Plan Note (Addendum)
Vaginal exam done on 05/08/12  Results reveal Gardnerella and Candida.  Will send in Flaygyl 500mg  BID x 7d and Fluconazole 150mg  x 1 dose. -Because of interactions with fluconazole, I asked patient to decrease her Valium dose to BID from TID and to hold statin for 2 days.

## 2012-06-22 DIAGNOSIS — N3946 Mixed incontinence: Secondary | ICD-10-CM | POA: Diagnosis not present

## 2012-06-22 DIAGNOSIS — R350 Frequency of micturition: Secondary | ICD-10-CM | POA: Diagnosis not present

## 2012-06-22 DIAGNOSIS — N3944 Nocturnal enuresis: Secondary | ICD-10-CM | POA: Diagnosis not present

## 2012-07-09 ENCOUNTER — Encounter: Payer: Self-pay | Admitting: Internal Medicine

## 2012-07-09 DIAGNOSIS — N3946 Mixed incontinence: Secondary | ICD-10-CM | POA: Insufficient documentation

## 2012-07-10 ENCOUNTER — Other Ambulatory Visit: Payer: Self-pay | Admitting: *Deleted

## 2012-07-13 MED ORDER — CARVEDILOL 25 MG PO TABS: 25.0000 mg | ORAL_TABLET | Freq: Two times a day (BID) | ORAL | Status: DC

## 2012-07-20 ENCOUNTER — Telehealth: Payer: Self-pay | Admitting: *Deleted

## 2012-07-20 ENCOUNTER — Other Ambulatory Visit: Payer: Self-pay | Admitting: Internal Medicine

## 2012-07-20 DIAGNOSIS — M170 Bilateral primary osteoarthritis of knee: Secondary | ICD-10-CM

## 2012-07-20 NOTE — Telephone Encounter (Signed)
Pt called - needs refill on Clinoril 200mg  [employer] - on hx of med sheet - given 03/04/12.

## 2012-07-21 ENCOUNTER — Other Ambulatory Visit: Payer: Self-pay | Admitting: Internal Medicine

## 2012-07-21 MED ORDER — SALSALATE 750 MG PO TABS: 1500.0000 mg | ORAL_TABLET | Freq: Two times a day (BID) | ORAL | Status: DC

## 2012-07-21 NOTE — Telephone Encounter (Signed)
Pt called at 6 pm on 07/21/2012 for refill of Sulindac. She had been out of her anti-inflammatory meds for a while and calling the clinic for refill, but says meds are not in pharmacy. I reviewed the notes- last OV by Dr. Milbert Coulter- 05/08/12- D/C'd Sulindac and started on Salsalate 1500 mg BID for pain. Discussed about this with patient and advised to get Salsalate from pharmacy. I sent 1 prescription for 120 tabs of 750 mg- which would last for 1 month. She says that has an upcoming appointment on 07/27/2012. She will pick up the prescription.

## 2012-07-21 NOTE — Telephone Encounter (Signed)
Prescription for Sulindac Signed to pharmacy.

## 2012-07-22 NOTE — Telephone Encounter (Signed)
Done by Dr. Patel.

## 2012-07-27 ENCOUNTER — Encounter: Payer: Medicare Other | Admitting: Internal Medicine

## 2012-08-05 DIAGNOSIS — F411 Generalized anxiety disorder: Secondary | ICD-10-CM | POA: Diagnosis not present

## 2012-08-05 DIAGNOSIS — F333 Major depressive disorder, recurrent, severe with psychotic symptoms: Secondary | ICD-10-CM | POA: Diagnosis not present

## 2012-09-23 ENCOUNTER — Ambulatory Visit: Payer: Medicare Other | Admitting: Internal Medicine

## 2012-10-05 ENCOUNTER — Other Ambulatory Visit: Payer: Self-pay | Admitting: Internal Medicine

## 2012-10-05 DIAGNOSIS — E785 Hyperlipidemia, unspecified: Secondary | ICD-10-CM

## 2012-10-12 ENCOUNTER — Encounter: Payer: Self-pay | Admitting: Internal Medicine

## 2012-10-12 ENCOUNTER — Other Ambulatory Visit (HOSPITAL_COMMUNITY)
Admission: RE | Admit: 2012-10-12 | Discharge: 2012-10-12 | Disposition: A | Discharge status: Home / Self Care | Payer: Medicare Other | Source: Ambulatory Visit | Attending: Internal Medicine | Admitting: Internal Medicine

## 2012-10-12 ENCOUNTER — Ambulatory Visit (INDEPENDENT_AMBULATORY_CARE_PROVIDER_SITE_OTHER): Payer: Medicare Other | Admitting: Internal Medicine

## 2012-10-12 VITALS — BP 155/97 | HR 74 | Temp 97.2°F | Ht 65.0 in | Wt 229.5 lb

## 2012-10-12 DIAGNOSIS — Z299 Encounter for prophylactic measures, unspecified: Secondary | ICD-10-CM | POA: Diagnosis not present

## 2012-10-12 DIAGNOSIS — Z23 Encounter for immunization: Secondary | ICD-10-CM | POA: Diagnosis not present

## 2012-10-12 DIAGNOSIS — R1032 Left lower quadrant pain: Secondary | ICD-10-CM

## 2012-10-12 DIAGNOSIS — F329 Major depressive disorder, single episode, unspecified: Secondary | ICD-10-CM

## 2012-10-12 DIAGNOSIS — N9489 Other specified conditions associated with female genital organs and menstrual cycle: Secondary | ICD-10-CM

## 2012-10-12 DIAGNOSIS — E079 Disorder of thyroid, unspecified: Secondary | ICD-10-CM

## 2012-10-12 DIAGNOSIS — N63 Unspecified lump in unspecified breast: Secondary | ICD-10-CM

## 2012-10-12 DIAGNOSIS — Z113 Encounter for screening for infections with a predominantly sexual mode of transmission: Secondary | ICD-10-CM | POA: Diagnosis not present

## 2012-10-12 DIAGNOSIS — I10 Essential (primary) hypertension: Secondary | ICD-10-CM

## 2012-10-12 DIAGNOSIS — N760 Acute vaginitis: Secondary | ICD-10-CM | POA: Diagnosis not present

## 2012-10-12 DIAGNOSIS — N921 Excessive and frequent menstruation with irregular cycle: Secondary | ICD-10-CM | POA: Diagnosis not present

## 2012-10-12 DIAGNOSIS — G894 Chronic pain syndrome: Secondary | ICD-10-CM

## 2012-10-12 DIAGNOSIS — Z1211 Encounter for screening for malignant neoplasm of colon: Secondary | ICD-10-CM

## 2012-10-12 DIAGNOSIS — R1031 Right lower quadrant pain: Secondary | ICD-10-CM

## 2012-10-12 DIAGNOSIS — N898 Other specified noninflammatory disorders of vagina: Secondary | ICD-10-CM

## 2012-10-12 HISTORY — DX: Other specified conditions associated with female genital organs and menstrual cycle: N94.89

## 2012-10-12 LAB — CBC
Hemoglobin: 16 g/dL — ABNORMAL HIGH (ref 12.0–15.0)
MCH: 31.7 pg (ref 26.0–34.0)
MCHC: 33.9 g/dL (ref 30.0–36.0)
MCV: 93.7 fL (ref 78.0–100.0)
RBC: 5.04 MIL/uL (ref 3.87–5.11)

## 2012-10-12 LAB — COMPREHENSIVE METABOLIC PANEL
BUN: 4 mg/dL — ABNORMAL LOW (ref 6–23)
CO2: 22 mEq/L (ref 19–32)
Calcium: 9.3 mg/dL (ref 8.4–10.5)
Chloride: 108 mEq/L (ref 96–112)
Creat: 0.73 mg/dL (ref 0.50–1.10)
Total Bilirubin: 0.3 mg/dL (ref 0.3–1.2)

## 2012-10-12 LAB — TSH: TSH: 3.57 u[IU]/mL (ref 0.350–4.500)

## 2012-10-12 LAB — FOLLICLE STIMULATING HORMONE: FSH: 31.9 m[IU]/mL

## 2012-10-12 LAB — LUTEINIZING HORMONE: LH: 13.7 m[IU]/mL

## 2012-10-12 MED ORDER — LISINOPRIL 40 MG PO TABS: 40.0000 mg | ORAL_TABLET | Freq: Every day | ORAL | Status: DC

## 2012-10-12 MED ORDER — TRAMADOL HCL 50 MG PO TABS: 50.0000 mg | ORAL_TABLET | Freq: Four times a day (QID) | ORAL | Status: DC | PRN

## 2012-10-12 MED ORDER — AMLODIPINE BESYLATE 10 MG PO TABS: 10.0000 mg | ORAL_TABLET | Freq: Every day | ORAL | Status: DC

## 2012-10-12 NOTE — Assessment & Plan Note (Addendum)
May be initiation of menopause, but cannot r/o endometrial malignancy, especially in the setting of 40lb weight loss since 04/2012 with decreased appetite.    -LH, FSH, TSH (h/o abn TSH) -transvaginal and pelvic US -CBC -Wet prep/ GC-Chlamydia sent

## 2012-10-12 NOTE — Patient Instructions (Addendum)
-  Please have Mammogram, transvaginal ultrasound & colonoscopy done.  Though weight loss was our goal, I am concerned about how quickly you've lost weight.  -I will call you with your blood work results - After we get your results,  I think you should meet with Dr. Alexandria Lodge to restart coumadin.  Please be sure to bring all of your medications with you to every visit.  Should you have any new or worsening symptoms, please be sure to call the clinic at [phone].

## 2012-10-12 NOTE — Assessment & Plan Note (Signed)
See workup for metrorrhagia.  Tramadol for pain mgmt until work up complete.  No GI complaints, so less likely related to bowel, but in setting of weight loss, will refer to GI for colonoscopy.  She has had LE DVTs in the past, and not currently anticoagulated, so mesenteric thrombisis is possible, but patient does not appear to be in critical condition/ no pain out of proportion & pain would less likely be in lower quadrants.  Regardless, will check CMET to monitor for gap.

## 2012-10-12 NOTE — Progress Notes (Signed)
Subjective:   Patient ID: Alisha [name] female   DOB: 05-11-1961 51 y.o.   MRN: [id number]  HPI: Alisha [name] is a 51 y.o. 51 y.o. woman with past medical history significant for peripheral vascular disease, history of DVTs, restless leg syndrome, chronic pain syndrome, DJD, depression, and anxiety.  Crampy  Lower abdominal/pelvic pain, like contractions, wakes her up x 1 month.  Tries to drink warm liquids with no relief.  Urine is clear.  3 menstrual cycles in the last month.  + hot flashes.  No intercourse, so cannot tell if blood or pain with intercourse.  Increased urinary frequency, but drinks a lot of fluids & on fluid pill.  40lb weight loss since May d/t decreased appetite/pain.  Feels nauseated but no vomiting because takes phenergan.  No diarrhea.  Regular BMs.  +Heart Palpitations, but not currently.  No fevers/night sweats.  +Vaginal odor, but no color.   Past Medical History  Diagnosis Date  . DVT (deep venous thrombosis)     bilateral, 2 episodes: Requires lifelong therapy  . Chronic pain syndrome   . Restless leg syndrome   . Depression   . Anxiety   . Hyperlipidemia   . Hypertension   . PVD (peripheral vascular disease)     s/p L fem-pop bypass 11/21/08; graft occluded 12/28/08;  aortogram w/ bilat LE runoff: 1.  bilat diffuse SFA occlusive dz, 2.  Mod to severe above-knee popliteal dz,  3.  Bilat 3-vessel runoff w/ mild tibial occlusive dz.  . Tobacco abuse   . Degenerative joint disease of knee   . Breast lump 02/2008    Biopsy 05/2008: showed no evidence of malignancy  . Irregular menses 9/08  . Pulmonary edema   . Nervousness   . Joint pain   . Leg pain   . Mixed stress and urge urinary incontinence     [facility], underwent cystoscopy & uroflowmetry    Current Outpatient Prescriptions  Medication Sig Dispense Refill  . albuterol (PROAIR HFA) 108 (90 BASE) MCG/ACT inhaler Inhale 2 puffs into the lungs every 6 (six) hours as needed for  wheezing. Take 1-2 puffs as needed every 4-6 hours as needed for shortness of breath  1 Inhaler  3  . amLODipine (NORVASC) 10 MG tablet Take 1 tablet (10 mg total) by mouth daily.  30 tablet  11  . aspirin 81 MG tablet Take 1 tablet (81 mg total) by mouth daily.  100 tablet  3  . atorvastatin (LIPITOR) 40 MG tablet Take 1 tablet (40 mg total) by mouth at bedtime.  31 tablet  11  . carvedilol (COREG) 25 MG tablet Take 1 tablet (25 mg total) by mouth 2 (two) times daily.  60 tablet  3  . diazepam (VALIUM) 5 MG tablet Take 5 mg by mouth every 8 (eight) hours as needed.        . DULoxetine (CYMBALTA) 60 MG capsule Take 60 mg by mouth. Take 1 capsule in the morning and 1 capsule at bedtime       . furosemide (LASIX) 40 MG tablet Take 1 tablet (40 mg total) by mouth daily.  30 tablet  0  . gabapentin (NEURONTIN) 800 MG tablet TAKE ONE TABLET BY MOUTH IN THE MORNING--1 AT MIDDAY--& 2 AT NIGHT  120 tablet  11  . hydrochlorothiazide (HYDRODIURIL) 25 MG tablet Take 1 tablet (25 mg total) by mouth daily.  30 tablet  5  . lisinopril (PRINIVIL,ZESTRIL) 40 MG  tablet Take 1 tablet (40 mg total) by mouth daily.  30 tablet  6  . Rivaroxaban 20 MG TABS Take 20 mg by mouth daily.  30 tablet  3  . sulindac (CLINORIL) 200 MG tablet TAKE ONE TABLET BY MOUTH TWICE DAILY  60 tablet  0  . traMADol (ULTRAM) 50 MG tablet Take 1 tablet (50 mg total) by mouth every 6 (six) hours as needed for pain.  120 tablet  1  . traZODone (DESYREL) 100 MG tablet Take 100-200 mg by mouth at bedtime.      . ziprasidone (GEODON) 60 MG capsule Take 60 mg by mouth 2 (two) times daily with a meal.        No family history on file. History   Social History  . Marital Status: Divorced    Spouse Name: N/A    Number of Children: N/A  . Years of Education: N/A   Social History Main Topics  . Smoking status: Current Every Day Smoker -- 0.5 packs/day    Types: Cigarettes  . Smokeless tobacco: Never Used  . Alcohol Use: No  . Drug Use: No    . Sexually Active: None   Other Topics Concern  . None   Social History Narrative   Software engineer initiated. Patient needs to submit further paperwork to [facility]  September 11, 2010 2:04 PMFinancial assistance approved for 100% discount at [facility] and has [facility] cardDeborah [facility]  October 04, 2010 5:29 PM   Review of Systems: Constitutional: Denies fever, chills  HEENT: Denies photophobia, eye pain, redness, hearing loss, ear pain, congestion, sore throat, rhinorrhea, sneezing, mouth sores, trouble swallowing, neck pain, neck stiffness and tinnitus.   Respiratory: Denies SOB, cough, chest tightness,  and wheezing.   Cardiovascular: Denies chest pain, palpitations Gastrointestinal: Denies vomiting, diarrhea, constipation, blood in stool and abdominal distention.  Genitourinary: Denies dysuria, urgency, frequency, hematuria, flank pain and difficulty urinating.  Musculoskeletal: Denies gait problem.  Skin: Denies pallor, rash and wound.  Neurological: Denies dizziness, seizures, syncope, weakness, light-headedness, numbness and headaches.  Psychiatric/Behavioral: Denies suicidal ideation  Objective:  Physical Exam: Filed Vitals:   10/12/12 0950  BP: 155/97  Pulse: 74  Temp: 97.2 F (36.2 C)  TempSrc: Oral  Height: 5\' 5"  (1.651 m)  Weight: 229 lb 8 oz (104.101 kg)  SpO2: 98%   Constitutional: Vital signs reviewed.  Patient is a well-developed and well-nourished woman in no acute distress and cooperative with exam. Head: Normocephalic and atraumatic Mouth: no erythema or exudates, MMM, poor dentition  Eyes: PERRL, EOMI, conjunctivae normal, No scleral icterus.  Neck: Supple, Trachea midline normal ROM, No JVD, mass, thyromegaly, or carotid bruit present.  Cardiovascular: RRR, S1 normal, S2 normal, no MRG, pulses symmetric and intact bilaterally Brest exam: no palpable masses, no nipple discharge, small erythematous papule on lateral aspect of right  breast Pulmonary/Chest: CTAB, no wheezes, rales, or rhonchi Abdominal: Soft. Tender to palpation of pelvic/bilateral lower abdominal regions, non-distended, bowel sounds are normal, no masses, organomegaly GU: no CVA tenderness Pelvic: +cervical motion tenderness, +malodorous bloody discharge, but no purulent/green/yellow discharge, no adnexal mass, no inguinal adenopathy Neurological: A&O x3 Skin: Warm, dry and intact. No rash, cyanosis, or clubbing.  Psychiatric: Normal mood and affect. speech and behavior is normal. Judgment and thought content normal. Cognition and memory are normal.   Assessment & Plan:  Case and care discussed with Dr. Kem Kays. Please see problem oriented charting for further details. Patient to return in 1 month to  confirm completion of work up, evaluate abdominal cramping and monitor BP.

## 2012-10-12 NOTE — Assessment & Plan Note (Addendum)
Followed by Dr. Carolanne Grumbling at [facility].  No SI/HI.  She has been started on wellbutrin in addition to geodon, trazodone & valium.

## 2012-10-12 NOTE — Progress Notes (Signed)
Pt aware [name] at [facility] 10/14/12 8:30AM - full bladder. Stanton Kidney Delon Revelo RN 10/12/12 2PM

## 2012-10-12 NOTE — Assessment & Plan Note (Signed)
Lab Results  Component Value Date   NA 139 05/08/2012   K 4.1 05/08/2012   CL 102 05/08/2012   CO2 29 05/08/2012   BUN 4* 05/08/2012   CREATININE 0.78 05/08/2012   CREATININE 0.63 12/07/2011    BP Readings from Last 3 Encounters:  10/12/12 155/97  05/08/12 119/81  04/23/12 114/82    Assessment: Hypertension control:  mildly elevated  Progress toward goals:  deteriorated Barriers to meeting goals:  acute illness  Plan: Hypertension treatment:  continue current medications - check CMET today, monitor, no changes in meds as she has been well controlled in the past and currently notes acute abd pain/cramping.

## 2012-10-12 NOTE — Assessment & Plan Note (Signed)
Diagnostic mammo not completed after last visit in 04/2012.  Breast exam today not concerning.  Biopsy in 2012 was benign.  Diagnostic b/l mammo scheduled today.

## 2012-10-12 NOTE — Assessment & Plan Note (Addendum)
At last visit, we decided to d/c coumadin and start rivaroxaban daily.  She did not start this medication - I am uncertain if because of cost?  She tells me she needed a paper script, but never called to let the clinic know, and today she notes interest in restarting coumadin.  She has not been on anticoagulation since 04/2012.  Given recent bleeding (metrorrhagia), I will check CBC today, and she will return on Wednesday 10/14/12 to see Dr. Alexandria Lodge to discuss re-initiation.  Physical exam today does not suggest edema, and she has chronic b/l LE pain.

## 2012-10-12 NOTE — Progress Notes (Signed)
Pt aware of appt at [facility] 10/15/12 8:45AM. Stanton Kidney Jol[place] Mallozzi RN  10/12/12 11AM

## 2012-10-14 ENCOUNTER — Ambulatory Visit (HOSPITAL_COMMUNITY): Payer: Medicare Other

## 2012-10-14 ENCOUNTER — Ambulatory Visit (HOSPITAL_COMMUNITY)
Admission: RE | Admit: 2012-10-14 | Discharge: 2012-10-14 | Disposition: A | Discharge status: Home / Self Care | Payer: Medicare Other | Source: Ambulatory Visit | Attending: Internal Medicine | Admitting: Internal Medicine

## 2012-10-14 DIAGNOSIS — R634 Abnormal weight loss: Secondary | ICD-10-CM | POA: Insufficient documentation

## 2012-10-14 DIAGNOSIS — N921 Excessive and frequent menstruation with irregular cycle: Secondary | ICD-10-CM | POA: Diagnosis not present

## 2012-10-14 DIAGNOSIS — D251 Intramural leiomyoma of uterus: Secondary | ICD-10-CM | POA: Diagnosis not present

## 2012-10-14 NOTE — Addendum Note (Signed)
Addended by: Belia Heman on: 10/14/2012 12:05 PM   Modules accepted: Orders

## 2012-10-15 ENCOUNTER — Encounter: Payer: Self-pay | Admitting: Obstetrics & Gynecology

## 2012-10-15 ENCOUNTER — Inpatient Hospital Stay: Admission: RE | Admit: 2012-10-15 | Payer: Medicare Other | Source: Ambulatory Visit

## 2012-10-19 ENCOUNTER — Telehealth: Payer: Self-pay | Admitting: *Deleted

## 2012-10-19 NOTE — Telephone Encounter (Signed)
Pt calls and states that she is having a great deal of pain in the lower abd/ pelvic area. She is advised to call and attempt to move her appt at gyn clinic up, be seen in [place] at [facility] or to call back and office visit will be scheduled

## 2012-10-21 ENCOUNTER — Encounter: Payer: Self-pay | Admitting: Vascular Surgery

## 2012-10-22 ENCOUNTER — Ambulatory Visit: Payer: Medicare Other | Admitting: Vascular Surgery

## 2012-10-23 ENCOUNTER — Encounter: Payer: Self-pay | Admitting: Internal Medicine

## 2012-10-23 ENCOUNTER — Ambulatory Visit (INDEPENDENT_AMBULATORY_CARE_PROVIDER_SITE_OTHER): Payer: Medicare Other | Admitting: Internal Medicine

## 2012-10-23 VITALS — BP 140/97 | HR 77 | Temp 98.7°F | Wt 225.5 lb

## 2012-10-23 DIAGNOSIS — N9489 Other specified conditions associated with female genital organs and menstrual cycle: Secondary | ICD-10-CM

## 2012-10-23 DIAGNOSIS — R1031 Right lower quadrant pain: Secondary | ICD-10-CM

## 2012-10-23 DIAGNOSIS — R1032 Left lower quadrant pain: Secondary | ICD-10-CM

## 2012-10-23 DIAGNOSIS — K5732 Diverticulitis of large intestine without perforation or abscess without bleeding: Secondary | ICD-10-CM | POA: Diagnosis not present

## 2012-10-23 DIAGNOSIS — I82409 Acute embolism and thrombosis of unspecified deep veins of unspecified lower extremity: Secondary | ICD-10-CM

## 2012-10-23 MED ORDER — HYDROCODONE-ACETAMINOPHEN 5-325 MG PO TABS: 1.0000 | ORAL_TABLET | Freq: Four times a day (QID) | ORAL | Status: DC | PRN

## 2012-10-23 NOTE — Assessment & Plan Note (Signed)
Compliance with coumadin has been an issue with patient.  She should schedule f/u with Dr. Alexandria Lodge.

## 2012-10-23 NOTE — Assessment & Plan Note (Signed)
Continues to have pain.  US revealed endometrial mass, possibly malignancy in setting of weight loss.  Again, no GI complaints.  She has an appt with Gyn on 11/04/12.  In the meanwhile, will treat with norco 5-325, 1-2 tab q6h prn # 40 (written enough for until she gets CT scan) - this pain medication is ONLY FOR ACUTE EVENTS AND SHOULD NOT BE CONTINUED ONCE ACUTE ISSUE HAS RESOLVED.  She is also scheduled for CT scan on Monday 10/26/12.

## 2012-10-23 NOTE — Progress Notes (Signed)
Subjective:   Patient ID: Alisha [name] female   DOB: June 06, 1961 51 y.o.   MRN: [id number]  HPI: Alisha [name] is a 51 y.o. woman with past medical history significant for peripheral vascular disease, history of DVTs, restless leg syndrome, chronic pain syndrome, DJD, depression, and anxiety.  She presents today with worsening crampy lower abdominal pain, 10/10, tramadol q1h, feels like pushing out a baby, radiates to the back, +nausea but no vomiting/diarrhea/constipation, no CP/SOB  Lost 5 more pounds since last visit on 10/12/12  appt with GYN on 11/04/12  Past Medical History  Diagnosis Date  . DVT (deep venous thrombosis)     bilateral, 2 episodes: Requires lifelong therapy  . Chronic pain syndrome   . Restless leg syndrome   . Depression   . Anxiety   . Hyperlipidemia   . Hypertension   . PVD (peripheral vascular disease)     s/p L fem-pop bypass 11/21/08; graft occluded 12/28/08;  aortogram w/ bilat LE runoff: 1.  bilat diffuse SFA occlusive dz, 2.  Mod to severe above-knee popliteal dz,  3.  Bilat 3-vessel runoff w/ mild tibial occlusive dz.  . Tobacco abuse   . Degenerative joint disease of knee   . Breast lump 02/2008    Biopsy 05/2008: showed no evidence of malignancy  . Irregular menses 9/08  . Pulmonary edema   . Nervousness   . Joint pain   . Leg pain   . Mixed stress and urge urinary incontinence     [facility], underwent cystoscopy & uroflowmetry    Current Outpatient Prescriptions  Medication Sig Dispense Refill  . albuterol (PROAIR HFA) 108 (90 BASE) MCG/ACT inhaler Inhale 2 puffs into the lungs every 6 (six) hours as needed for wheezing. Take 1-2 puffs as needed every 4-6 hours as needed for shortness of breath  1 Inhaler  3  . amLODipine (NORVASC) 10 MG tablet Take 1 tablet (10 mg total) by mouth daily.  30 tablet  11  . aspirin 81 MG tablet Take 1 tablet (81 mg total) by mouth daily.  100 tablet  3  . atorvastatin (LIPITOR) 40 MG  tablet Take 1 tablet (40 mg total) by mouth at bedtime.  31 tablet  11  . buPROPion (WELLBUTRIN XL) 300 MG 24 hr tablet Take 300 mg by mouth every morning.      . carvedilol (COREG) 25 MG tablet Take 1 tablet (25 mg total) by mouth 2 (two) times daily.  60 tablet  3  . diazepam (VALIUM) 5 MG tablet Take 5 mg by mouth every 8 (eight) hours as needed.        . DULoxetine (CYMBALTA) 60 MG capsule Take 60 mg by mouth. Take 1 capsule in the morning and 1 capsule at bedtime       . furosemide (LASIX) 40 MG tablet Take 1 tablet (40 mg total) by mouth daily.  30 tablet  0  . gabapentin (NEURONTIN) 800 MG tablet TAKE ONE TABLET BY MOUTH IN THE MORNING--1 AT MIDDAY--& 2 AT NIGHT  120 tablet  11  . hydrochlorothiazide (HYDRODIURIL) 25 MG tablet Take 1 tablet (25 mg total) by mouth daily.  30 tablet  5  . lisinopril (PRINIVIL,ZESTRIL) 40 MG tablet Take 1 tablet (40 mg total) by mouth daily.  30 tablet  6  . sulindac (CLINORIL) 200 MG tablet TAKE ONE TABLET BY MOUTH TWICE DAILY  60 tablet  0  . traMADol (ULTRAM) 50 MG tablet Take  1 tablet (50 mg total) by mouth every 6 (six) hours as needed for pain.  120 tablet  1  . traZODone (DESYREL) 100 MG tablet Take 100-200 mg by mouth at bedtime.      . ziprasidone (GEODON) 60 MG capsule Take 60 mg by mouth 2 (two) times daily with a meal.        No family history on file. History   Social History  . Marital Status: Divorced    Spouse Name: N/A    Number of Children: N/A  . Years of Education: N/A   Social History Main Topics  . Smoking status: Current Every Day Smoker -- 0.5 packs/day    Types: Cigarettes  . Smokeless tobacco: Never Used  . Alcohol Use: No  . Drug Use: No  . Sexually Active: None   Other Topics Concern  . None   Social History Narrative   Software engineer initiated. Patient needs to submit further paperwork to [facility]  September 11, 2010 2:04 PMFinancial assistance approved for 100% discount at [facility] and has  [facility] cardDeborah [facility]  October 04, 2010 5:29 PM   Review of Systems: Constitutional: Denies fever, chills  HEENT: Denies photophobia, eye pain, redness, hearing loss, ear pain, congestion, sore throat, rhinorrhea, sneezing, mouth sores, trouble swallowing, neck pain, neck stiffness and tinnitus.  Respiratory: Denies SOB, cough, chest tightness, and wheezing.  Cardiovascular: Denies chest pain, palpitations  Gastrointestinal: Denies vomiting, diarrhea, constipation, blood in stool and abdominal distention.  Genitourinary: Denies dysuria, urgency, frequency, hematuria, flank pain and difficulty urinating.  Musculoskeletal: Denies gait problem.  Skin: Denies pallor, rash and wound.  Neurological: Denies dizziness, seizures, syncope, weakness, light-headedness, numbness and headaches.  Psychiatric/Behavioral: Denies suicidal ideation   Objective:  Physical Exam: Filed Vitals:   10/23/12 1449  BP: 140/97  Pulse: 77  Temp: 98.7 F (37.1 C)  TempSrc: Oral  Weight: 225 lb 8 oz (102.286 kg)    Constitutional: Vital signs reviewed. Patient is a well-developed and well-nourished woman in no acute distress and cooperative with exam.  Head: Normocephalic and atraumatic  Mouth: no erythema or exudates, MMM, poor dentition  Eyes: PERRL, EOMI, conjunctivae normal, No scleral icterus.  Cardiovascular: RRR, S1 normal, S2 normal, no MRG, pulses symmetric and intact bilaterally  Pulmonary/Chest: CTAB, no wheezes, rales, or rhonchi  Abdominal: Soft. Tender to palpation of pelvic/bilateral lower abdominal regions, non-distended, bowel sounds are normal, no masses, organomegaly  GU: no CVA tenderness  Neurological: A&O x3  Skin: Warm, dry and intact. No rash, cyanosis, or clubbing.  Psychiatric: Normal mood and affect. speech and behavior is normal. Judgment and thought content normal. Cognition and memory are normal.    Assessment & Plan:  Case and care discussed with Dr. Dalphine Handing. Please see  problem oriented charting for further details. Patient to return once GYN work up complete.

## 2012-10-23 NOTE — Patient Instructions (Signed)
-  Please schedule an appointment with Dr. Alexandria Lodge  -Be sure not to miss your appointment with Gynecology  -Be sure to have your CT scan of your abdomen done  -I am prescribing Hydrocodone - acetaminophen 5-325, 1-2 tabs, every 6 hours as needed  - we are giving you this medication just for the acute issues that you are having currently.  Please be sure to bring all of your medications with you to every visit.  Should you have any new or worsening symptoms, please be sure to call the clinic at [phone].

## 2012-10-26 ENCOUNTER — Ambulatory Visit (HOSPITAL_COMMUNITY)
Admission: RE | Admit: 2012-10-26 | Discharge: 2012-10-26 | Disposition: A | Discharge status: Home / Self Care | Payer: Medicare Other | Source: Ambulatory Visit | Attending: Internal Medicine | Admitting: Internal Medicine

## 2012-10-26 ENCOUNTER — Encounter (HOSPITAL_COMMUNITY): Payer: Self-pay

## 2012-10-26 DIAGNOSIS — K5732 Diverticulitis of large intestine without perforation or abscess without bleeding: Secondary | ICD-10-CM | POA: Insufficient documentation

## 2012-10-26 DIAGNOSIS — N9489 Other specified conditions associated with female genital organs and menstrual cycle: Secondary | ICD-10-CM

## 2012-10-26 DIAGNOSIS — K429 Umbilical hernia without obstruction or gangrene: Secondary | ICD-10-CM | POA: Insufficient documentation

## 2012-10-26 DIAGNOSIS — R634 Abnormal weight loss: Secondary | ICD-10-CM | POA: Diagnosis not present

## 2012-10-26 DIAGNOSIS — R109 Unspecified abdominal pain: Secondary | ICD-10-CM | POA: Insufficient documentation

## 2012-10-26 DIAGNOSIS — R1909 Other intra-abdominal and pelvic swelling, mass and lump: Secondary | ICD-10-CM | POA: Diagnosis not present

## 2012-10-26 DIAGNOSIS — D259 Leiomyoma of uterus, unspecified: Secondary | ICD-10-CM | POA: Insufficient documentation

## 2012-10-26 HISTORY — DX: Diverticulitis of large intestine without perforation or abscess without bleeding: K57.32

## 2012-10-26 MED ORDER — IOHEXOL 300 MG/ML  SOLN
100.0000 mL | Freq: Once | INTRAMUSCULAR | Status: AC | PRN
  Administered 2012-10-26: 100 mL via INTRAVENOUS

## 2012-10-26 MED ORDER — METRONIDAZOLE 500 MG PO TABS: 500.0000 mg | ORAL_TABLET | Freq: Three times a day (TID) | ORAL | Status: AC

## 2012-10-26 MED ORDER — CIPROFLOXACIN HCL 500 MG PO TABS: 500.0000 mg | ORAL_TABLET | Freq: Two times a day (BID) | ORAL | Status: AC

## 2012-10-26 NOTE — Assessment & Plan Note (Signed)
CT scan on 10/26/12 revealed mild sigmoid diverticulitis.  Patient reports pain is well controlled with pain medication, and she denies N/V.  She had diarrhea with bowel prep.  Will prescribe cipro and flagyl for 14days.  Instructed patient to abstain from [facility].  Instructed her to still keep follow up with GYN even though CT did not reveal endometrial mass.  Encouraged her to take only clears by mouth.  She understands to seek immediate medical attn should her symptoms deteriorate.

## 2012-10-26 NOTE — Addendum Note (Signed)
Addended by: Belia Heman on: 10/26/2012 01:54 PM   Modules accepted: Orders

## 2012-11-04 ENCOUNTER — Encounter: Payer: Self-pay | Admitting: Obstetrics & Gynecology

## 2012-11-04 ENCOUNTER — Other Ambulatory Visit (HOSPITAL_COMMUNITY)
Admission: RE | Admit: 2012-11-04 | Discharge: 2012-11-04 | Disposition: A | Discharge status: Home / Self Care | Payer: Medicare Other | Source: Ambulatory Visit | Attending: Obstetrics & Gynecology | Admitting: Obstetrics & Gynecology

## 2012-11-04 ENCOUNTER — Ambulatory Visit (INDEPENDENT_AMBULATORY_CARE_PROVIDER_SITE_OTHER): Payer: Medicare Other | Admitting: Obstetrics & Gynecology

## 2012-11-04 VITALS — BP 152/94 | HR 79 | Temp 96.3°F | Resp 24 | Ht 64.5 in | Wt 221.2 lb

## 2012-11-04 DIAGNOSIS — N939 Abnormal uterine and vaginal bleeding, unspecified: Secondary | ICD-10-CM

## 2012-11-04 DIAGNOSIS — N949 Unspecified condition associated with female genital organs and menstrual cycle: Secondary | ICD-10-CM | POA: Diagnosis not present

## 2012-11-04 DIAGNOSIS — Z01812 Encounter for preprocedural laboratory examination: Secondary | ICD-10-CM | POA: Diagnosis not present

## 2012-11-04 DIAGNOSIS — N926 Irregular menstruation, unspecified: Secondary | ICD-10-CM | POA: Diagnosis not present

## 2012-11-04 MED ORDER — MEGESTROL ACETATE 40 MG PO TABS: 40.0000 mg | ORAL_TABLET | Freq: Every day | ORAL | Status: DC

## 2012-11-04 NOTE — Patient Instructions (Signed)

## 2012-11-04 NOTE — Addendum Note (Signed)
Addended by: Franchot Mimes on: 11/04/2012 05:03 PM   Modules accepted: Orders

## 2012-11-04 NOTE — Progress Notes (Signed)
Subjective:     Patient ID: Alisha [name], female   DOB: [birth year], 51 y.o.   MRN: [id number]  HPI  Pt with c/o severe and PMPB and endometrial mass on sono.  Pt reports that pain is severe encompasses her entire abd- upper and lower.  Was told that the pain was due to fibroids which were seen on the sono   Pt also c.o bleeding 2-3 weeks out of the month.     Review of Systems     Objective:   Physical ExamBP 152/94  Pulse 79  Temp 96.3 F (35.7 C) (Oral)  Resp 24  Ht 5' 4.5" (1.638 m)  Wt 221 lb 3.2 oz (100.336 kg)  BMI 37.38 kg/m2  LMP 10/15/2012 Abd: morbidly obese.  Diffusely tender.  Soft. No rebound. No guarding.  Not reproducible in acuity. The indications for endometrial biopsy were reviewed.   Risks of the biopsy including cramping, bleeding, infection, uterine perforation, inadequate specimen and need for additional procedures  were discussed. The patient states she understands and agrees to undergo procedure today. Consent was signed. Time out was performed. Urine HCG was negative. A sterile speculum was placed in the patient's vagina and the cervix was prepped with Betadine. A single-toothed tenaculum was placed on the anterior lip of the cervix to stabilize it. The 3 mm pipelle was introduced into the endometrial cavity without difficulty to a depth of 9cm, and a moderate amount of tissue was obtained and sent to pathology. The instruments were removed from the patient's vagina. Minimal bleeding from the cervix was noted. The patient tolerated the procedure well. Routine post-procedure instructions were given to the patient.   10/12/12 TRANSABDOMINAL AND TRANSVAGINAL ULTRASOUND OF PELVIS  Technique: Both transabdominal and transvaginal ultrasound  examinations of the pelvis were performed. Transabdominal  technique was performed for global imaging of the pelvis including  uterus, ovaries, adnexal regions, and pelvic cul-de-sac.  It was necessary to proceed with endovaginal  exam following the  transabdominal exam to visualize the endometrium and adnexal  structures. 10/03/2008.  Comparison: None.  Findings:  Uterus: The uterus measures 10.3 x 3.9 x 1.3 cm. Peripherally  calcified intramural fibroid is noted within the posterior  myometrium measuring 1.9 x 1.6 x 1.8 cm.  Endometrium: The endometrium measures 10.6 mm. There is an  echogenic endometrial mass which measures 1.3 cm. A small amount  of fluid is noted within the endometrial cavity.  Right ovary: Not visualize.  Left ovary: Left ovary measures 2.9 x 1.9 x 2.0 cm. Cyst within  the left ovary measures 1.4 x 1.3 x 1.4 cm.  Other Findings: No free fluid  IMPRESSION:  1. Indeterminant echogenic mass within the endometrium with  surrounding fluid. Cannot rule out benign or malignant neoplasm.  Further evaluation with sonohysterogram and/or biopsy is  recommended.  2. Posterior intramural fibroid.      Assessment:     Endometrial mass/pelvic pain Post menopausal blededing    Plan:     F/u Endobx F/u 2 weeks for results Megace 40mg  q day  Briann Sarchet L. Harraway-Smith, M.D., Evern Core

## 2012-11-05 ENCOUNTER — Encounter: Payer: Self-pay | Admitting: Obstetrics & Gynecology

## 2012-11-09 ENCOUNTER — Telehealth: Payer: Self-pay | Admitting: *Deleted

## 2012-11-09 NOTE — Telephone Encounter (Signed)
Pt informed

## 2012-11-09 NOTE — Telephone Encounter (Signed)
Message copied by Mannie Stabile on Mon Nov 09, 2012  4:34 PM ------      Message from: Willodean Rosenthal      Created: Sun Nov 08, 2012 12:16 PM       Please notify pt of normal endo bx.            Rec continue Megace and f/u as scheduled.            Thx,      clh-S

## 2012-11-10 ENCOUNTER — Encounter: Payer: Self-pay | Admitting: Internal Medicine

## 2012-11-10 ENCOUNTER — Encounter: Payer: Self-pay | Admitting: Gastroenterology

## 2012-11-10 ENCOUNTER — Ambulatory Visit (INDEPENDENT_AMBULATORY_CARE_PROVIDER_SITE_OTHER): Payer: Medicare Other | Admitting: Internal Medicine

## 2012-11-10 VITALS — BP 140/91 | HR 79 | Temp 98.7°F | Wt 221.4 lb

## 2012-11-10 DIAGNOSIS — Z9889 Other specified postprocedural states: Secondary | ICD-10-CM

## 2012-11-10 DIAGNOSIS — N9489 Other specified conditions associated with female genital organs and menstrual cycle: Secondary | ICD-10-CM

## 2012-11-10 DIAGNOSIS — R634 Abnormal weight loss: Secondary | ICD-10-CM | POA: Diagnosis not present

## 2012-11-10 DIAGNOSIS — Z1239 Encounter for other screening for malignant neoplasm of breast: Secondary | ICD-10-CM | POA: Diagnosis not present

## 2012-11-10 NOTE — Progress Notes (Signed)
Subjective:   Patient ID: Alisha [name] female   DOB: 27-Apr-1961 51 y.o.   MRN: [id number]  HPI:51 year  old woman with past medical history significant for hypertension, hyperlipidemia, anxiety presents to the clinic for followup visit.  She is very upset, tearful and full of questions during the conversation. She was showing ecstatic responses if some test results were favorable.  She is very concerned about her decreased appetite and weight loss. In her work up for abdominal pain, metrorrhagia, she was found to have endometrial mass on ultrasound and underwent biopsy that was consistent with benign process. She also underwent CT scan as a part of workup and was prescribed a course of Cipro and Flagyl for mild diverticulitis that was seen on the imaging. But she states that Flagyl causes her to have more loss of appetite and therefore she has not  been taking the medicine as directed. She started taking medicines from 10/26/2012 but she still had 4-5 day supply left with her. She states that her abdominal pain is better than her last visit. Denies any nausea, vomiting, cough, fever, chills, alteration in bladder or bowel movements, blood in the stools   She has lost 48 lbs  since May 2013. Filed Weights   11/10/12 0838  Weight: 221 lb 6.4 oz (100.426 kg)                             269 lbs in 05/13   She was very concerned that she might have some sexually transmitted disease.  She smokes about 5- 6 cig /day . Has increased lately with  loss of appetite.  Past Medical History  Diagnosis Date  . DVT (deep venous thrombosis)     bilateral, 2 episodes: Requires lifelong therapy  . Chronic pain syndrome   . Restless leg syndrome   . Depression   . Anxiety   . Hyperlipidemia   . Hypertension   . PVD (peripheral vascular disease)     s/p L fem-pop bypass 11/21/08; graft occluded 12/28/08;  aortogram w/ bilat LE runoff: 1.  bilat diffuse SFA occlusive dz, 2.  Mod to severe above-knee  popliteal dz,  3.  Bilat 3-vessel runoff w/ mild tibial occlusive dz.  . Tobacco abuse   . Degenerative joint disease of knee   . Breast lump 02/2008    Biopsy 05/2008: showed no evidence of malignancy  . Irregular menses 9/08  . Pulmonary edema   . Nervousness   . Joint pain   . Leg pain   . Mixed stress and urge urinary incontinence     Being followed by [facility], underwent cystoscopy & uroflowmetry    Family History  Problem Relation Age of Onset  . Cancer Mother     breast   History   Social History  . Marital Status: Divorced    Spouse Name: N/A    Number of Children: N/A  . Years of Education: N/A   Occupational History  . Not on file.   Social History Main Topics  . Smoking status: Current Every Day Smoker -- 0.2 packs/day for 18 years    Types: Cigarettes  . Smokeless tobacco: Never Used  . Alcohol Use: No  . Drug Use: No  . Sexually Active: No   Other Topics Concern  . Not on file   Social History Narrative   Financial assistance application initiated. Patient needs to submit further paperwork to [facility]  September 11, 2010 2:04 PMFinancial assistance approved for 100% discount at [facility] and [facility] card[facility]  October 04, 2010 5:29 PM   Review of Systems: General: Denies fever, chills, diaphoresis, appetite change and fatigue. HEENT: Denies photophobia, eye pain, redness, hearing loss, ear pain, congestion, sore throat, rhinorrhea, sneezing, mouth sores, trouble swallowing, neck pain, neck stiffness and tinnitus. Respiratory: Denies SOB, DOE, cough, chest tightness, and wheezing. Cardiovascular: Denies to chest pain, palpitations and leg swelling. Gastrointestinal: Denies nausea, vomiting, abdominal pain, diarrhea, constipation, blood in stool and abdominal distention. Genitourinary: Denies dysuria, urgency, frequency, hematuria, flank pain and difficulty urinating. Musculoskeletal: Denies myalgias, back pain, joint swelling,  arthralgias and gait problem.  Skin: Denies pallor, rash and wound. Neurological: Denies dizziness, seizures, syncope, weakness, light-headedness, numbness and headaches. Hematological: Denies adenopathy, easy bruising, personal or family bleeding history. Psychiatric/Behavioral: Denies suicidal ideation, mood changes, confusion, nervousness, sleep disturbance and agitation. + loss of appetite, + weight loss    Current Outpatient Medications: Current Outpatient Prescriptions  Medication Sig Dispense Refill  . albuterol (PROAIR HFA) 108 (90 BASE) MCG/ACT inhaler Inhale 2 puffs into the lungs every 6 (six) hours as needed for wheezing. Take 1-2 puffs as needed every 4-6 hours as needed for shortness of breath  1 Inhaler  3  . amLODipine (NORVASC) 10 MG tablet Take 1 tablet (10 mg total) by mouth daily.  30 tablet  11  . aspirin 81 MG tablet Take 1 tablet (81 mg total) by mouth daily.  100 tablet  3  . atorvastatin (LIPITOR) 40 MG tablet Take 1 tablet (40 mg total) by mouth at bedtime.  31 tablet  11  . buPROPion (WELLBUTRIN XL) 300 MG 24 hr tablet Take 300 mg by mouth every morning.      . carvedilol (COREG) 25 MG tablet Take 1 tablet (25 mg total) by mouth 2 (two) times daily.  60 tablet  3  . [EXPIRED] ciprofloxacin (CIPRO) 500 MG tablet Take 1 tablet (500 mg total) by mouth 2 (two) times daily.  28 tablet  0  . diazepam (VALIUM) 5 MG tablet Take 10 mg by mouth every 8 (eight) hours as needed.       . DULoxetine (CYMBALTA) 60 MG capsule Take 60 mg by mouth. Take 1 capsule in the morning and 1 capsule at bedtime       . furosemide (LASIX) 40 MG tablet Take 1 tablet (40 mg total) by mouth daily.  30 tablet  0  . gabapentin (NEURONTIN) 800 MG tablet TAKE ONE TABLET BY MOUTH IN THE MORNING--1 AT MIDDAY--& 2 AT NIGHT  120 tablet  11  . hydrochlorothiazide (HYDRODIURIL) 25 MG tablet Take 1 tablet (25 mg total) by mouth daily.  30 tablet  5  . HYDROcodone-acetaminophen (NORCO) 5-325 MG per tablet  Take 1-2 tablets by mouth every 6 (six) hours as needed for pain.  40 tablet  0  . lisinopril (PRINIVIL,ZESTRIL) 40 MG tablet Take 1 tablet (40 mg total) by mouth daily.  30 tablet  6  . megestrol (MEGACE) 40 MG tablet Take 1 tablet (40 mg total) by mouth daily.  30 tablet  1  . sulindac (CLINORIL) 200 MG tablet TAKE ONE TABLET BY MOUTH TWICE DAILY  60 tablet  0  . traMADol (ULTRAM) 50 MG tablet Take 1 tablet (50 mg total) by mouth every 6 (six) hours as needed for pain.  120 tablet  1  . traZODone (DESYREL) 100 MG tablet Take 100-200 mg by mouth  at bedtime.      . ziprasidone (GEODON) 60 MG capsule Take 60 mg by mouth 2 (two) times daily with a meal.         Allergies: Allergies  Allergen Reactions  . Propoxyphene-Acetaminophen Swelling      Objective:   Physical Exam: Filed Vitals:   11/10/12 0838  BP: 140/91  Pulse: 79  Temp: 98.7 F (37.1 C)    General: Vital signs reviewed and noted. Well-developed, well-nourished, in no acute distress; alert, appropriate and cooperative throughout examination. Head: Normocephalic, atraumatic Lungs: Normal respiratory effort. Clear to auscultation BL without crackles or wheezes. Heart: RRR. S1 and S2 normal without gallop, murmur, or rubs. Abdomen:BS normoactive. Soft, Nondistended, non-tender.  No masses or organomegaly. Extremities: No pretibial edema.     Assessment & Plan:

## 2012-11-10 NOTE — Patient Instructions (Addendum)
Please schedule a follow up appointment in 1 month. Please bring your medication bottles with your next appointment. Please take your medicines as prescribed.

## 2012-11-10 NOTE — Assessment & Plan Note (Addendum)
Patient is very concerned for weight loss ~48 lbs since May 2013 and loss of appetite, increasingly worse for last 1 month. She was recently found to have endometrial mass on her vaginal ultrasound and and the results from the biopsy are consistent with benign process. She is a smoker and continues to smoke about 5-6 cigarettes a day. Her physical exam was unremarkable with today's visit. Her labs were reviewed  From 10/13/11 that  were within normal limits. She was tested for HIV in May 2013 which was nonreactive but because of her persistent concern for sexually transmitted diseases I would repeat HIV again. Patient has never had a colonoscopy so far and her last mammogram was in 2009. She has been complaining of loss of appetite more so for last 1 month in the setting of taking Flagyl for her mild diverticulitis. I think her appetite is getting worse with the metallic taste from Flagyl and definitely smoking plays a role. She has already completed 9-10 day of therapy for mild diverticulitis. She is asymptomatic with no WBC or fever. In the setting of this persistent loss of appetite- she was told that she may stop the antibiotics if she doesn't feel like continuing them anymore. Etiology for weight loss is not clear so far, but would try to get her up todate on screening  -Ambulatory referral to GI for colonoscopy. -Schedule mammogram. -Encouraged her for smoking cessation -She was advised to eat in small portions more frequently of whateever she likes -If the above workup is negative then we may consider proceeding With EGD  Has been prescribed Megace for bleeding controlled by her OB/GYN

## 2012-11-12 ENCOUNTER — Telehealth: Payer: Self-pay | Admitting: *Deleted

## 2012-11-12 NOTE — Telephone Encounter (Signed)
I did call her back with the lab results. Thank you, Shavaughn Seidl

## 2012-11-12 NOTE — Telephone Encounter (Signed)
Pt would like for you to call her with her lab results

## 2012-11-18 ENCOUNTER — Encounter: Payer: Medicare Other | Admitting: Internal Medicine

## 2012-11-19 ENCOUNTER — Ambulatory Visit: Payer: Medicare Other | Admitting: Obstetrics & Gynecology

## 2012-11-19 DIAGNOSIS — F333 Major depressive disorder, recurrent, severe with psychotic symptoms: Secondary | ICD-10-CM | POA: Diagnosis not present

## 2012-11-19 DIAGNOSIS — F411 Generalized anxiety disorder: Secondary | ICD-10-CM | POA: Diagnosis not present

## 2012-11-28 ENCOUNTER — Encounter: Payer: Self-pay | Admitting: Internal Medicine

## 2012-12-03 ENCOUNTER — Ambulatory Visit: Payer: Medicare Other | Admitting: Gastroenterology

## 2013-01-07 NOTE — Addendum Note (Signed)
Addended by: Neomia Dear on: 01/07/2013 06:01 PM   Modules accepted: Orders

## 2013-01-11 ENCOUNTER — Other Ambulatory Visit: Payer: Self-pay | Admitting: *Deleted

## 2013-01-11 DIAGNOSIS — F333 Major depressive disorder, recurrent, severe with psychotic symptoms: Secondary | ICD-10-CM | POA: Diagnosis not present

## 2013-01-11 DIAGNOSIS — F411 Generalized anxiety disorder: Secondary | ICD-10-CM | POA: Diagnosis not present

## 2013-01-11 MED ORDER — CARVEDILOL 25 MG PO TABS: 25.0000 mg | ORAL_TABLET | Freq: Two times a day (BID) | ORAL | Status: DC

## 2013-01-11 MED ORDER — HYDROCHLOROTHIAZIDE 25 MG PO TABS: 25.0000 mg | ORAL_TABLET | Freq: Every day | ORAL | Status: DC

## 2013-01-19 ENCOUNTER — Other Ambulatory Visit: Payer: Self-pay | Admitting: *Deleted

## 2013-01-19 DIAGNOSIS — G894 Chronic pain syndrome: Secondary | ICD-10-CM

## 2013-01-19 MED ORDER — TRAMADOL HCL 50 MG PO TABS: 50.0000 mg | ORAL_TABLET | Freq: Four times a day (QID) | ORAL | Status: DC | PRN

## 2013-03-08 ENCOUNTER — Other Ambulatory Visit: Payer: Self-pay | Admitting: *Deleted

## 2013-03-08 DIAGNOSIS — G894 Chronic pain syndrome: Secondary | ICD-10-CM

## 2013-03-08 MED ORDER — TRAMADOL HCL 50 MG PO TABS: 50.0000 mg | ORAL_TABLET | Freq: Four times a day (QID) | ORAL | Status: DC | PRN

## 2013-03-29 DIAGNOSIS — F411 Generalized anxiety disorder: Secondary | ICD-10-CM | POA: Diagnosis not present

## 2013-03-29 DIAGNOSIS — F333 Major depressive disorder, recurrent, severe with psychotic symptoms: Secondary | ICD-10-CM | POA: Diagnosis not present

## 2013-04-07 ENCOUNTER — Encounter: Payer: Medicare Other | Admitting: Internal Medicine

## 2013-04-13 ENCOUNTER — Other Ambulatory Visit: Payer: Self-pay | Admitting: *Deleted

## 2013-04-13 DIAGNOSIS — E785 Hyperlipidemia, unspecified: Secondary | ICD-10-CM

## 2013-04-13 NOTE — Telephone Encounter (Signed)
Insurance request 90 day supply

## 2013-04-14 MED ORDER — ATORVASTATIN CALCIUM 40 MG PO TABS: 40.0000 mg | ORAL_TABLET | Freq: Every day | ORAL | Status: DC

## 2013-05-10 DIAGNOSIS — F411 Generalized anxiety disorder: Secondary | ICD-10-CM | POA: Diagnosis not present

## 2013-05-10 DIAGNOSIS — F333 Major depressive disorder, recurrent, severe with psychotic symptoms: Secondary | ICD-10-CM | POA: Diagnosis not present

## 2013-05-19 ENCOUNTER — Other Ambulatory Visit: Payer: Self-pay | Admitting: *Deleted

## 2013-05-19 DIAGNOSIS — G894 Chronic pain syndrome: Secondary | ICD-10-CM

## 2013-05-19 MED ORDER — TRAMADOL HCL 50 MG PO TABS: 50.0000 mg | ORAL_TABLET | Freq: Four times a day (QID) | ORAL | Status: DC | PRN

## 2013-05-27 ENCOUNTER — Other Ambulatory Visit: Payer: Self-pay | Admitting: Internal Medicine

## 2013-07-04 LAB — URINALYSIS W/ REFLEX CULTURE
Bilirubin: NEGATIVE
Blood: NEGATIVE
Glucose: NEGATIVE mg/dL
Ketone: NEGATIVE mg/dL
Leukocyte Esterase: NEGATIVE
Nitrites: NEGATIVE
Protein: NEGATIVE mg/dL
Specific gravity: 1.018 (ref 1.003–1.030)
Urobilinogen: 1 EU/dL (ref 0.2–1.0)
pH (UA): 6.5 (ref 5.0–8.0)

## 2013-07-04 LAB — HCG URINE, QL. - POC: Pregnancy test,urine (POC): NEGATIVE

## 2013-07-04 MED ADMIN — oxyCODONE-acetaminophen (PERCOCET) 5-325 mg per tablet 1 Tab: ORAL | @ 19:00:00 | NDC 68084035511

## 2013-07-04 MED FILL — OXYCODONE-ACETAMINOPHEN 5 MG-325 MG TAB: 5-325 mg | ORAL | Qty: 1

## 2013-07-04 NOTE — ED Notes (Signed)
Pt asked for food.  Provided PB crackers, pt was already drinking coffee.  Pt states that the pain medication has not helped.

## 2013-07-04 NOTE — ED Notes (Signed)
POC pregnancy negative

## 2013-07-04 NOTE — ED Notes (Signed)
Pt and "boyfriend" told registration that she is pregnant.  Pt told nurse during triage that she would liked to be checked to see if she is pregnant, stated she had a period last month.

## 2013-07-04 NOTE — ED Notes (Addendum)
Triage Note: Patient brought by [facility] from home with c/o lower back pain x 3 weeks. She has been taking motrin without relief. Pt reports last dose of motrin was yesterday.

## 2013-07-04 NOTE — ED Provider Notes (Signed)
HPI Comments: This is a 52 y.o. female who presents from home via EMS with cc of back pain. Pt reports onset of low to mid back pain beginning 3 weeks ago. Pt rates current pain at 10/10, per triage note, and describes pain as "sharp." Pt reports pain typically presents with movement. Pt reports radiation of pain to her legs and occasionally the left big toe. Pt reports accompanying intermittent leg numbness. Pt reports Ibuprofen use without relief. Pt reports recent hot and cold chills and abdominal pain. Pt reports hx of urinary frequency. Pt states there is a possibility she is pregnant as she has not had her period this month. Pt reports hx of irregular menstrual cycles. Pt denies recent strenuous activity, dysuria.         There are no other complaints or pertinent physical findings at this time.    PMhx is significant for: Blind left eye, per triage note.  Surgical Hx is significant for: None.     PCP: None  Allergies: None known, per triage note.   Social hx: reports that she has been smoking.  She does not have any smokeless tobacco history on file. She reports that  drinks alcohol. She reports that she does not use illicit drugs.        The history is provided by the patient.        Past Medical History   Diagnosis Date   ??? Blind left eye         History reviewed. No pertinent past surgical history.      History reviewed. No pertinent family history.     History     Social History   ??? Marital Status: SINGLE     Spouse Name: N/A     Number of Children: N/A   ??? Years of Education: N/A     Occupational History   ??? Not on file.     Social History Main Topics   ??? Smoking status: Current Every Day Smoker -- 0.50 packs/day   ??? Smokeless tobacco: Not on file   ??? Alcohol Use: Yes   ??? Drug Use: No   ??? Sexually Active: Not on file     Other Topics Concern   ??? Not on file     Social History Narrative   ??? No narrative on file                  ALLERGIES: Review of patient's allergies indicates no known allergies.       Review of Systems   Constitutional: Positive for chills.   Genitourinary: Positive for frequency (chronic). Negative for dysuria.   Musculoskeletal: Positive for back pain.   Neurological: Positive for numbness.   All other systems reviewed and are negative.    Note written by Kenney Houseman, Scribe, as dictated by Catalina Pizza, MD 2:40 PM       Filed Vitals:    07/04/13 1416   BP: 122/74   Pulse: 76   Temp: 97.9 ??F (36.6 ??C)   Resp: 18   Height: 5\' 1"  (1.549 m)   Weight: 65.772 kg (145 lb)   SpO2: 98%            Physical Exam   Nursing note and vitals reviewed.  Constitutional: She is oriented to person, place, and time. She appears well-developed and well-nourished. No distress.   HENT:   Head: Normocephalic and atraumatic.   Eyes: Conjunctivae are normal. No scleral icterus.  Neck: Neck supple. No tracheal deviation present.   Cardiovascular: Normal rate, regular rhythm, normal heart sounds and intact distal pulses.  Exam reveals no gallop and no friction rub.    No murmur heard.  Pulmonary/Chest: Effort normal and breath sounds normal. She has no wheezes. She has no rales.   Abdominal: Soft. She exhibits no distension. There is tenderness (mild, right sided). There is no rebound and no guarding.   Musculoskeletal: She exhibits no edema.   Diffuse lumbar tenderness, reproducible pain with movement.    Neurological: She is alert and oriented to person, place, and time.   Skin: Skin is warm and dry. No rash noted.   Psychiatric: She has a normal mood and affect.      Note written by Kenney Houseman, Scribe, as dictated by Catalina Pizza, MD 2:42 PM     MDM    Procedures    Progress Note:  Results, treatment, and follow up plan have been discussed with patient/boyfriend.  Questions were answered.   Catalina Pizza, MD  3:50 PM  A/P: back pain - c/w musculoskeletal pain; UA neg; films show degenerative changes; home with continued Motrin/ Flexeril/ Percocet.  PCP f/u.  Catalina Pizza, MD  3:51 PM

## 2013-07-04 NOTE — ED Notes (Signed)
Pt received discharge instructions from MD.  VSS.  Pt out to waiting room in wheelchair.

## 2013-07-04 NOTE — ED Notes (Signed)
Pt getting dressed with assistance of female companion.

## 2013-07-05 DIAGNOSIS — F333 Major depressive disorder, recurrent, severe with psychotic symptoms: Secondary | ICD-10-CM | POA: Diagnosis not present

## 2013-07-05 LAB — CULTURE, URINE
Colonies Counted: <1000
Colony Count: <1000
Culture result:: NO GROWTH
Culture: NO GROWTH

## 2013-07-14 ENCOUNTER — Other Ambulatory Visit: Payer: Self-pay | Admitting: *Deleted

## 2013-07-14 DIAGNOSIS — G894 Chronic pain syndrome: Secondary | ICD-10-CM

## 2013-07-14 MED ORDER — TRAMADOL HCL 50 MG PO TABS: 50.0000 mg | ORAL_TABLET | Freq: Four times a day (QID) | ORAL | Status: DC | PRN

## 2013-07-16 ENCOUNTER — Encounter: Payer: Self-pay | Admitting: Internal Medicine

## 2013-07-16 ENCOUNTER — Ambulatory Visit (INDEPENDENT_AMBULATORY_CARE_PROVIDER_SITE_OTHER): Payer: Medicare Other | Admitting: Internal Medicine

## 2013-07-16 VITALS — BP 170/90 | HR 70 | Temp 97.0°F | Wt 184.2 lb

## 2013-07-16 DIAGNOSIS — Z7901 Long term (current) use of anticoagulants: Secondary | ICD-10-CM | POA: Diagnosis not present

## 2013-07-16 DIAGNOSIS — I739 Peripheral vascular disease, unspecified: Secondary | ICD-10-CM | POA: Diagnosis not present

## 2013-07-16 DIAGNOSIS — N63 Unspecified lump in unspecified breast: Secondary | ICD-10-CM

## 2013-07-16 DIAGNOSIS — F17200 Nicotine dependence, unspecified, uncomplicated: Secondary | ICD-10-CM | POA: Diagnosis not present

## 2013-07-16 DIAGNOSIS — G894 Chronic pain syndrome: Secondary | ICD-10-CM

## 2013-07-16 DIAGNOSIS — I10 Essential (primary) hypertension: Secondary | ICD-10-CM

## 2013-07-16 DIAGNOSIS — I82409 Acute embolism and thrombosis of unspecified deep veins of unspecified lower extremity: Secondary | ICD-10-CM

## 2013-07-16 LAB — CBC
MCH: 32.7 pg (ref 26.0–34.0)
MCHC: 34.5 g/dL (ref 30.0–36.0)
Platelets: 227 10*3/uL (ref 150–400)

## 2013-07-16 LAB — POCT INR: INR: 1

## 2013-07-16 LAB — BASIC METABOLIC PANEL
BUN: 7 mg/dL (ref 6–23)
CO2: 25 mEq/L (ref 19–32)
Chloride: 107 mEq/L (ref 96–112)
Creat: 0.83 mg/dL (ref 0.50–1.10)

## 2013-07-16 MED ORDER — TRAMADOL HCL (ER BIPHASIC) 100 MG PO CP24: 100.0000 mg | ORAL_CAPSULE | Freq: Three times a day (TID) | ORAL | Status: DC | PRN

## 2013-07-16 MED ORDER — TRAMADOL HCL (ER BIPHASIC) 100 MG PO CP24: 100.0000 mg | ORAL_CAPSULE | Freq: Two times a day (BID) | ORAL | Status: DC | PRN

## 2013-07-16 MED ORDER — WARFARIN SODIUM 10 MG PO TABS: 10.0000 mg | ORAL_TABLET | ORAL | Status: DC

## 2013-07-16 NOTE — Assessment & Plan Note (Signed)
  Assessment: Progress toward smoking cessation:  smoking the same amount Barriers to progress toward smoking cessation:    Comments: continues to smoke same amount, she says whenever she gets a chance despite cessation  Plan: Instruction/counseling given:  I counseled patient on the dangers of tobacco use, advised patient to stop smoking, and reviewed strategies to maximize success. Educational resources provided:  [employer]) brochure Self management tools provided:    Medications to assist with smoking cessation:  None offered assistance but not interested at this time Patient agreed to the following self-care plans for smoking cessation:    Other plans: does not appear motivated to quit at this time.  i counseled her again especially with her HTN and PVD.

## 2013-07-16 NOTE — Assessment & Plan Note (Signed)
Continues to smoke cigarettes.  Appears loss to follow up with vascular.  Need to verify if seen and if not need to re-establish care

## 2013-07-16 NOTE — Assessment & Plan Note (Signed)
BP Readings from Last 3 Encounters:  07/16/13 170/90  11/10/12 140/91  11/04/12 152/94   Lab Results  Component Value Date   NA 142 10/12/2012   K 3.9 10/12/2012   CREATININE 0.73 10/12/2012   Assessment: Blood pressure control: moderately elevated Progress toward BP goal:  deteriorated Comments: likely elevated in setting of pain  Plan: Medications:  continue current medications max on current meds, norvasc 10, coreg 25mg  bid, hctz 25mg , and lisinopril 40mg .    Educational resources provided:   Self management tools provided:   recommended checking BP at home and keep track of readings Other plans: Was on lasix but claims was discontinued.  Hoping with pain control BP will improve, if not will need to add another agent and ?complaince. Cautioned on symptoms of high blood pressure including headache, dizziness, blurry or changes in vision, nausea, vomiting, syncope, diaphoresis.  If notices these symptoms, asked to call pcp right away

## 2013-07-16 NOTE — Progress Notes (Signed)
Subjective:   Patient ID: Alisha [name] female   DOB: 1961/10/08 52 y.o.   MRN: [id number]  HPI: AlishaAlisha [name] is a 52 y.o. 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  Alisha [name] has a history of pain contract violation and is presenting today asking for pain medication and claiming "you guys can admit me to the hospital so I can get something for pain".  Her tramadol 50mg  q6h was just recently filled by pcp and she says that is not helping. She says the pain is everywhere and it makes her blood pressure go up and her legs give up on her which results in tripping and occasionally falling on he knees.  She has visible healed scars on her right knee.  She denies hitting her head in these falls and complains of occasional dizziness that spontaneously resolves because she is in so much pain.  Her last reported trip and fall incident was a couple of days ago.  She reports compliance with her tramadol and neurontin.    Of note, she has not been seen in [facility] since 10/2012.  She claims she has home agency that comes to her home and checks her INR by [facility] but does not dose her coumadin and they told her she needs to come here to get INR checked.  She has a hx of DVTs and seems to be on chronic coumadin.  She thinks she takes 7.5mg  daily and feels like she has more clots in her legs and that her blood is not thin enough.   Alisha [name] also claims she follows with Sports medicine Dr. Darrick Penna and was last seen May 2014, although no documentation on epic.  She also sees another Dr. Darrick Penna from vascular but the last note in Epic is from 09/2011 and was supposed to follow up in 6 months for ABIs.  Last ABI from 03/2012 showed no major change from prior.  She continues to smoke despite cessation counseling by multiple  providers.    We discussed her prior violations of pain contract in the past and she claims that was because she asked for refills to early. I explained to her that [facility] does not prescribe her pain medications and she was not happy and became tearful and continuously asked to be admitted to the hospital.  I encouraged her to go to pain clinic for further evaluation and to discuss with her pcp.  She agreed for pain clinic referral.  In the meantime, we will increase her tramadol to 100mg  ER up to BID PRN with caution for possible serotonin syndrome.  I went over possible side effects and alarm signs including fever, swats, confusion for serotonin syndrome and she understood and knows to call the clinic right away or go ED if she notices any of the concerning signs.   Past Medical History  Diagnosis Date  . DVT (deep venous thrombosis)     bilateral, 2 episodes: Requires lifelong therapy  . Chronic pain syndrome   . Restless leg syndrome   . Depression   . Anxiety   . Hyperlipidemia   . Hypertension   . PVD (peripheral vascular disease)     s/p L fem-pop bypass 11/21/08; graft occluded 12/28/08;  aortogram w/ bilat LE runoff: 1.  bilat diffuse SFA occlusive dz, 2.  Mod to severe above-knee popliteal dz,  3.  Bilat 3-vessel runoff w/ mild tibial occlusive dz.  . Tobacco abuse   . Degenerative joint disease of knee   . Breast lump 02/2008    Biopsy 05/2008: showed no evidence of malignancy  . Irregular menses 9/08  . Pulmonary edema   . Anxiety   . Joint pain   . Mixed stress and urge urinary incontinence     Being followed by [facility], underwent cystoscopy & uroflowmetry    Current Outpatient Prescriptions  Medication Sig Dispense Refill  . albuterol (PROAIR HFA) 108 (90 BASE) MCG/ACT inhaler Inhale 2 puffs into the lungs every 6 (six) hours as needed for wheezing. Take 1-2 puffs as needed every 4-6 hours as needed for shortness of breath  1 Inhaler  3  . amLODipine (NORVASC) 10 MG  tablet Take 1 tablet (10 mg total) by mouth daily.  30 tablet  11  . aspirin 81 MG tablet Take 1 tablet (81 mg total) by mouth daily.  100 tablet  3  . atorvastatin (LIPITOR) 40 MG tablet Take 1 tablet (40 mg total) by mouth at bedtime.  90 tablet  3  . buPROPion (WELLBUTRIN XL) 300 MG 24 hr tablet Take 300 mg by mouth every morning.      . carvedilol (COREG) 25 MG tablet Take 1 tablet (25 mg total) by mouth 2 (two) times daily.  180 tablet  1  . diazepam (VALIUM) 5 MG tablet Take 10 mg by mouth every 8 (eight) hours as needed.       . DULoxetine (CYMBALTA) 60 MG capsule Take 60 mg by mouth. Take 1 capsule in the morning and 1 capsule at bedtime       . furosemide (LASIX) 40 MG tablet Take 1 tablet (40 mg total) by mouth daily.  30 tablet  0  . gabapentin (NEURONTIN) 800 MG tablet TAKE ONE TABLET BY MOUTH IN THE MORNING--1 AT MIDDAY--& 2 AT NIGHT  120 tablet  11  . hydrochlorothiazide (HYDRODIURIL) 25 MG tablet TAKE ONE TABLET BY MOUTH ONE TIME DAILY  30 tablet  1  . HYDROcodone-acetaminophen (NORCO) 5-325 MG per tablet Take 1-2 tablets by mouth every 6 (six) hours as needed for pain.  40 tablet  0  . lisinopril (PRINIVIL,ZESTRIL) 40 MG tablet Take 1 tablet (40 mg total) by mouth daily.  30 tablet  6  . megestrol (MEGACE) 40 MG tablet Take 1 tablet (40 mg total) by mouth daily.  30 tablet  1  . sulindac (CLINORIL) 200 MG tablet TAKE ONE TABLET BY MOUTH TWICE DAILY  60 tablet  0  . traMADol (ULTRAM) 50 MG tablet Take 1 tablet (50 mg total) by mouth every 6 (six) hours as needed for pain.  120 tablet  1  . traZODone (DESYREL) 100 MG tablet Take 100-200 mg by mouth at bedtime.      . ziprasidone (GEODON) 60 MG capsule Take 60 mg by mouth 2 (two) times daily with a meal.        No current facility-administered medications for this visit.   Family History  Problem Relation Age of Onset  . Cancer Mother     breast   History   Social History  . Marital Status: Divorced    Spouse Name: N/A     Number of Children: N/A  . Years of Education: N/A   Social History Main Topics  . Smoking status: Current Every Day Smoker -- 0.25 packs/day for  18 years    Types: Cigarettes  . Smokeless tobacco: Never Used  . Alcohol Use: No  . Drug Use: No  . Sexually Active: No   Other Topics Concern  . Not on file   Social History Narrative   Financial assistance application initiated. Patient needs to submit further paperwork to complete   [facility]  September 11, 2010 2:04 PM   Financial assistance approved for 100% discount at [facility] and has [facility] card   Rudell Cobb  October 04, 2010 5:29 PM   Review of Systems:  Constitutional:  Decreased appetite, occasional fatigue.  Denies fever, chills, diaphoresis.  HEENT:  Denies congestion, sore throat, rhinorrhea, sneezing, mouth sores, trouble swallowing, neck pain   Respiratory:  Denies SOB, DOE, cough, and wheezing.   Cardiovascular:  Denies chest pain, palpitations, and leg swelling.   Gastrointestinal:  Abdominal pain. Denies nausea, vomiting, diarrhea, constipation, blood in stool and abdominal distention.   Genitourinary:  Denies dysuria, urgency, frequency, hematuria, flank pain and difficulty urinating.   Musculoskeletal:  Diffuse body pain  Skin:  Well healed scars on right knee.  Denies pallor, rash and wound.   Neurological:  Falls, dizziness, left leg weakness.  Denies seizures, syncope, numbness and headaches.    Objective:  Physical Exam: Filed Vitals:   07/16/13 0948 07/16/13 1052 07/16/13 1053  BP: 177/99 170/90   Pulse: 70  70  Temp: 97 F (36.1 C)    TempSrc: Oral    Weight: 184 lb 3.2 oz (83.553 kg)    SpO2: 100%     Vitals reviewed. General: sitting in chair, acute distress and occasionally tearful HEENT: PERRL, EOMI, no scleral icterus Cardiac: RRR, no rubs, murmurs or gallops Pulm: clear to auscultation bilaterally, no wheezes, rales, or rhonchi Abd: soft, tenderness to deep palpation diffusely, L>R, distended,  BS present Ext: warm and well perfused, no pedal edema, +2DP B/L, -tenderness to palpation, b/l surgical well healed LLE scars and small R knee scars Neuro: alert and oriented X3, cranial nerves II-XII grossly intact, decreased strength LLE 4/5 compared to 5/5 b/l upper extremities and RLE and sensation decreased on LLE compared to other extremities Cerebellar exam: gait normal, no abnormalities noted on finger to nose and no disdiadochokinesia, slight swaying with eyes closed but no drift of arms  Assessment & Plan:  Discussed with Dr. Criselda Peaches Chronic pain--pain clinic referral, increased tramadol to 100mg  ER up to bid prn with caution for serotonin syndrome Chronic anticoagulation: loss to follow up--INR today 1.0, recommended coumadin 10mg  daily until Monday to see Dr. Alexandria Lodge Needs to follow up with pcp on next visit with at least 1 hour slot!

## 2013-07-16 NOTE — Patient Instructions (Addendum)
General Instructions:  Please follow up with pain clinic and sports medicine and vascular surgery  Take 10mg  daily of coumadin starting today until Monday when you come and see Dr. Alexandria Lodge!  We have changed your tramadol to 100mg  ER tablet to be taken up to twice a day only! Do not exceed this amount and contact your pcp if any concerns   We have reviewed the side effects of tramadol with your other medications, again monitor for:   Confusion.  Agitation.  Weakness.  Insomnia.  Fever  Sweats.  If you notice any of these side effects stop medications immediately and contact the clinic right away or go to ED  If you notice more falls or dizziness, or headaches, contact us right away as well  PLEASE STOP SMOKING call [phone]  Check your BP at home and bring readings to your next visit with Dr. Everardo Beals   Treatment Goals:  Goals (1 Years of Data) as of 07/16/13         As of Today 11/10/12 11/04/12 10/23/12 10/12/12     Blood Pressure    . Blood Pressure < 140/90  177/99 140/91 152/94 140/97 155/97      Progress Toward Treatment Goals:  Treatment Goal 07/16/2013  Blood pressure deteriorated  Stop smoking smoking the same amount  Prevent falls deteriorated    Self Care Goals & Plans:  Self Care Goal 07/16/2013  Manage my medications take my medicines as prescribed   Care Management & Community Referrals: Serotonin Syndrome Serotonin is a brain chemical that regulates the nervous system. Some kinds of drugs increase the amount of serotonin in your body. Drugs that increase the serotonin in your body include:   Anti-depressant medications.  St. John's wort.  Recreational drugs.  Migraine medicines.  Some pain medicines. SYMPTOMS Combining these drugs increases the risk that you will become ill with a toxic condition called serotonin syndrome.  Symptoms of too much serotonin  include:  Confusion.  Agitation.  Weakness.  Insomnia.  Fever.  Sweats. Other symptoms that may develop include:  Shakiness.  Muscle spasms.  Seizures. TREATMENT  Hospital treatment is often needed until the effects are controlled.  Avoiding the combination of medicines listed above is recommended.  Check with your doctor if you are concerned about your medicine or the side effects. Document Released: 01/23/2005 Document Revised: 03/09/2012 Document Reviewed: 12/16/2005 [facility] Patient Information 2014 [place], [place].  Tramadol extended release tablets or capsules What is this medicine? TRAMADOL (TRA ma dole) is a pain reliever. It is used to treat moderate to severe pain in adults. This medicine may be used for other purposes; ask your health care provider or pharmacist if you have questions. What should I tell my health care provider before I take this medicine? They need to know if you have any of these conditions: -brain tumor -drink more than 3 alcohol-containing drinks per day -drug abuse or addiction -head injury -kidney disease or problems going to the bathroom -liver disease -lung disease, asthma, or breathing problems -seizures or epilepsy -an unusual or allergic reaction to tramadol, codeine, other medicines, foods, dyes, or preservatives -pregnant or trying to get pregnant -breast-feeding How should I use this medicine? Take this medicine by mouth with a glass of water. Follow the directions on the prescription label. Do not cut, crush or chew this medicine. Crushing this medicine may cause overdose and death. This risk is increased in patients who abuse alcohol or other substances. Take this medicine the same way  each day, either with food or not. If the medicine upsets your stomach, take it with food or milk. Take your medicine at regular intervals. Do not take it more often than directed. Talk to your pediatrician regarding the use of this medicine in  children. This medicine is not approved for use in children. Overdosage: If you think you have taken too much of this medicine contact a poison control center or emergency room at once. NOTE: This medicine is only for you. Do not share this medicine with others. What if I miss a dose? If you miss a dose, take it as soon as you can. If it is almost time for your next dose, take only that dose. Do not take double or extra doses. What may interact with this medicine? Do not take this medicine with any of the following medications: -MAOIs like Carbex, Eldepryl, Marplan, Nardil, and Parnate This medicine may also interact with the following medications: -alcohol or medicines that contain alcohol -antihistamines -benzodiazepines -bupropion -carbamazepine or oxcarbazepine -clozapine -cyclobenzaprine -digoxin -furazolidone -linezolid -medicines for depression, anxiety, or psychotic disturbances -medicines for migraine headache like almotriptan, eletriptan, frovatriptan, naratriptan, rizatriptan, sumatriptan, zolmitriptan -medicines for pain like pentazocine, buprenorphine, butorphanol, meperidine, nalbuphine, and propoxyphene -medicines for sleep -muscle relaxants -naltrexone -phenobarbital -phenothiazines like perphenazine, thioridazine, chlorpromazine, mesoridazine, fluphenazine, prochlorperazine, promazine, and trifluoperazine -procarbazine -warfarin This list may not describe all possible interactions. Give your health care provider a list of all the medicines, herbs, non-prescription drugs, or dietary supplements you use. Also tell them if you smoke, drink alcohol, or use illegal drugs. Some items may interact with your medicine. What should I watch for while using this medicine? Tell your doctor or health care professional if your pain does not go away, if it gets worse, or if you have new or a different type of pain. You may develop tolerance to the medicine. Tolerance means that you  will need a higher dose of the medicine for pain relief. Tolerance is normal and is expected if you take this medicine for a long time. Do not suddenly stop taking your medicine because you may develop a severe reaction. Your body becomes used to the medicine. This does NOT mean you are addicted. Addiction is a behavior related to getting and using a drug for a non-medical reason. If you have pain, you have a medical reason to take pain medicine. Your doctor will tell you how much medicine to take. If your doctor wants you to stop the medicine, the dose will be slowly lowered over time to avoid any side effects. You may get drowsy or dizzy. Do not drive, use machinery, or do anything that needs mental alertness until you know how this medicine affects you. Do not stand or sit up quickly, especially if you are an older patient. This reduces the risk of dizzy or fainting spells. Alcohol can increase or decrease the effects of this medicine. Avoid alcoholic drinks. You may have constipation. Try to have a bowel movement at least every 2 to 3 days. If you do not have a bowel movement for 3 days, call your doctor or health care professional. Your mouth may get dry. Chewing sugarless gum or sucking hard candy, and drinking plenty of water may help. Contact your doctor if the problem does not go away or is severe. What side effects may I notice from receiving this medicine? Side effects that you should report to your doctor or health care professional as soon as possible: -allergic reactions like skin  rash, itching or hives, swelling of the face, lips, or tongue -breathing problems -confusion -feeling faint or lightheaded, falls -itching -redness, blistering, peeling or loosening of the skin, including inside the mouth -seizures Side effects that usually do not require medical attention (report to your doctor or health care professional if they continue or are  bothersome): -constipation -dizziness -drowsiness -headache -nausea, vomiting This list may not describe all possible side effects. Call your doctor for medical advice about side effects. You may report side effects to FDA at 1-800-FDA-1088. Where should I keep my medicine? Keep out of the reach of children. Store at room temperature between 15 and 30 degrees C (59 and 86 degrees F). Keep container tightly closed. Throw away any unused medicine after the expiration date. NOTE: This sheet is a summary. It may not cover all possible information. If you have questions about this medicine, talk to your doctor, pharmacist, or health care provider.  2012, Elsevier/Gold Standard. (08/29/2010 12:27:40 PM)

## 2013-07-16 NOTE — Assessment & Plan Note (Signed)
Hx of DVTs x2.  Lost to follow up in coumadin clinic.  Claims [facility] comes to home checks INR but does not change dose.  She claims she is on 7.5mg  qd.  This needs to be verified as it seems unlikely that no one is adjusting her coumadin dose.  She does not remember her last INR but claims the last visit was in June and she was recommended to come to clinic for check.    INR checked today 1.0.  Discussed with pharmacy, increased coumadin to 10mg  po qd until seen by Dr. Alexandria Lodge on Monday  -explained to patient importance of meeting with Dr. Alexandria Lodge on Monday and taking coumadin as prescribed and following up appropriately

## 2013-07-19 ENCOUNTER — Ambulatory Visit (INDEPENDENT_AMBULATORY_CARE_PROVIDER_SITE_OTHER): Payer: Medicare Other | Admitting: Pharmacist

## 2013-07-19 DIAGNOSIS — Z7901 Long term (current) use of anticoagulants: Secondary | ICD-10-CM

## 2013-07-19 DIAGNOSIS — I82409 Acute embolism and thrombosis of unspecified deep veins of unspecified lower extremity: Secondary | ICD-10-CM | POA: Diagnosis not present

## 2013-07-19 NOTE — Patient Instructions (Signed)
Patient instructed to take medications as defined in the Anti-coagulation Track section of this encounter.  Patient instructed to take today's dose.  Patient verbalized understanding of these instructions.    

## 2013-07-19 NOTE — Progress Notes (Signed)
Anti-Coagulation Progress Note  MAICEY BARRIENTEZ is a 52 y.o. female who is currently on an anti-coagulation regimen.    RECENT RESULTS: Recent results are below, the most recent result is correlated with a dose of 11.25 mg PER DAY since last seeing Dr. Virgina Organ in [facility] on 18-JUL-14. She was supposed to have been taking 1x10mg  (e-prescribed to her OP Rx---but they did not have 10mg  strength tablets in store. She was advised to take 1 and 1/2 x 7.5mg  (11.25mg ) to approximate as close to possible the intention of the prescribing physician). I have reviewed a Rx bottle she brings to [facility] with her---she indeed DOES have 7.5mg  strength tablets and she states has been taking 11.25mg  as documented here--since 18-JUL-14. Will increase her total weekly dosage as shown below and see her in 1 week. At that time, I will e-prescribe her additional 7.5mg  strength warfarin tablets.   Lab Results  Component Value Date   INR 1.2 07/19/2013   INR 1.0 07/16/2013   INR 2.45* 05/08/2012    ANTI-COAG DOSE: Anticoagulation Dose Instructions as of 07/19/2013     [name] Tue Wed Thu Fri Sat   New Dose 7.5 mg 7.5 mg 11.25 mg 7.5 mg 7.5 mg 11.25 mg 7.5 mg       ANTICOAG SUMMARY: Anticoagulation Episode Summary   Current INR goal 2.0-3.0  Next INR check 07/26/2013  INR from last check 1.2! (07/19/2013)  Weekly max dose   Target end date   [facility]  Preferred lab   Send INR reminders to ANTICOAG IMP   Indications  DVT [453.40] Long term current use of anticoagulant [V58.61]        Comments       Anticoagulation Care Providers   Provider Role Specialty Phone number   Zoila Shutter, MD  Internal Medicine [phone]      ANTICOAG TODAY: Anticoagulation Summary as of 07/19/2013   INR goal 2.0-3.0  Selected INR 1.2! (07/19/2013)  Next INR check 07/26/2013  Target end date    Indications  DVT [453.40] Long term current use of anticoagulant [V58.61]      Anticoagulation Episode  Summary   [facility]   Preferred lab    Send INR reminders to ANTICOAG IMP   Comments     Anticoagulation Care Providers   Provider Role Specialty Phone number   Zoila Shutter, MD  Internal Medicine [phone]      PATIENT INSTRUCTIONS: Patient Instructions  Patient instructed to take medications as defined in the Anti-coagulation Track section of this encounter.  Patient instructed to take today's dose.  Patient verbalized understanding of these instructions.       FOLLOW-UP Return in 7 days (on 07/26/2013) for Follow up INR at 1015h.  Hulen Luster, III Pharm.D., CACP

## 2013-07-20 NOTE — Progress Notes (Signed)
Case discussed with Dr. Qureshi at the time of the visit.  We reviewed the resident's history and exam and pertinent patient test results.  I agree with the assessment, diagnosis, and plan of care documented in the resident's note. 

## 2013-07-21 ENCOUNTER — Telehealth: Payer: Self-pay | Admitting: *Deleted

## 2013-07-21 NOTE — Telephone Encounter (Signed)
Pt called c/o not being able to get her tramadol. i called pharmacy and pharm states insurance is declining to pay for tramadol and did not offer a PA or suggest any substitutions for tramadol. Pt states she needs something for pain. Please advise

## 2013-07-22 NOTE — Telephone Encounter (Signed)
Pt called again today asking for another pain medication as insurance will not pay for tramadol. Pt # [phone]

## 2013-07-22 NOTE — Telephone Encounter (Signed)
I remember just refilling her tramadol 1-2 weeks prior, which is probably why insurance won't fill.

## 2013-07-23 NOTE — Telephone Encounter (Signed)
I called pharmacy and pt last received tramadol on 7/16 # 120.  It is not time for refill BUT another MD changed order to Tramadol CP24 ( extended release ) and that is not covered. Can you change back to regular Tramadol for next refill?

## 2013-07-26 ENCOUNTER — Telehealth: Payer: Self-pay

## 2013-07-26 ENCOUNTER — Ambulatory Visit (INDEPENDENT_AMBULATORY_CARE_PROVIDER_SITE_OTHER): Payer: Medicare Other | Admitting: Pharmacist

## 2013-07-26 DIAGNOSIS — Z7901 Long term (current) use of anticoagulants: Secondary | ICD-10-CM

## 2013-07-26 DIAGNOSIS — I82409 Acute embolism and thrombosis of unspecified deep veins of unspecified lower extremity: Secondary | ICD-10-CM | POA: Diagnosis not present

## 2013-07-26 DIAGNOSIS — I739 Peripheral vascular disease, unspecified: Secondary | ICD-10-CM

## 2013-07-26 DIAGNOSIS — M79609 Pain in unspecified limb: Secondary | ICD-10-CM

## 2013-07-26 MED ORDER — WARFARIN SODIUM 7.5 MG PO TABS: 7.5000 mg | ORAL_TABLET | Freq: Every day | ORAL | Status: DC

## 2013-07-26 NOTE — Progress Notes (Signed)
Anti-Coagulation Progress Note  Alisha [name] is a 52 y.o. female who is currently on an anti-coagulation regimen.    RECENT RESULTS: Recent results are below, the most recent result is correlated with a dose of 60 mg. per week: Lab Results  Component Value Date   INR 4.50 07/26/2013   INR 1.2 07/19/2013   INR 1.0 07/16/2013    ANTI-COAG DOSE: Anticoagulation Dose Instructions as of 07/26/2013     [name] Tue Wed Thu Fri Sat   New Dose 7.5 mg 7.5 mg 7.5 mg 7.5 mg 7.5 mg 7.5 mg 7.5 mg       ANTICOAG SUMMARY: Anticoagulation Episode Summary   Current INR goal 2.0-3.0  Next INR check 08/02/2013  INR from last check 4.50! (07/26/2013)  Weekly max dose   Target end date   [facility]  Preferred lab   Send INR reminders to ANTICOAG IMP   Indications  DVT [453.40] Long term current use of anticoagulant [V58.61]        Comments       Anticoagulation Care Providers   Provider Role Specialty Phone number   Zoila Shutter, MD  Internal Medicine [phone]      ANTICOAG TODAY: Anticoagulation Summary as of 07/26/2013   INR goal 2.0-3.0  Selected INR 4.50! (07/26/2013)  Next INR check 08/02/2013  Target end date    Indications  DVT [453.40] Long term current use of anticoagulant [V58.61]      Anticoagulation Episode Summary   [facility]   Preferred lab    Send INR reminders to ANTICOAG IMP   Comments     Anticoagulation Care Providers   Provider Role Specialty Phone number   Zoila Shutter, MD  Internal Medicine [phone]      PATIENT INSTRUCTIONS: Patient Instructions  Patient instructed to take medications as defined in the Anti-coagulation Track section of this encounter.  Patient instructed to OMIT today's dose.  Patient verbalized understanding of these instructions.       FOLLOW-UP Return in 7 days (on 08/02/2013) for Follow up INR at 1100h.  Hulen Luster, III Pharm.D., CACP

## 2013-07-26 NOTE — Telephone Encounter (Signed)
Phone call from pt.  C/o having "chronic pain in my left leg."  States has had pain and increasing weakness in left leg over past 6 weeks.  Reports that "my left leg aches when I move."  States that some days she has difficulty walking due to the leg weakness.  C/o of intermittent change in temperature with "1/2 of my left leg feeling warm, and 1/2 of it feels cold."  Denies any open sores.  States she is on Coumadin and has had her "dosage increased due to more blood clots."    Reports that she has swelling from foot to knees bilaterally, and has been prescribed a fluid pill, and TED hose.  Pt's. last appt. was 03/2012; missed appt. in Oct. 2013.  Advised will receive phone call tomorrow with future appts. To be scheduled; advised to call if symptoms worsen prior to her appt. Verb. Understanding.

## 2013-07-26 NOTE — Patient Instructions (Signed)
Patient instructed to take medications as defined in the Anti-coagulation Track section of this encounter.  Patient instructed to OMIT today's dose.  Patient verbalized understanding of these instructions.    

## 2013-07-27 NOTE — Telephone Encounter (Signed)
Due to missed surveillance appt. In 09/2012, will schedule pt. for ABI's and Right LE Art. Duplex, and also office evaluation for current symptoms.

## 2013-07-27 NOTE — Telephone Encounter (Signed)
She can have regular tramadol at her next refill. Thanks

## 2013-08-02 ENCOUNTER — Ambulatory Visit (INDEPENDENT_AMBULATORY_CARE_PROVIDER_SITE_OTHER): Payer: Medicare Other | Admitting: Pharmacist

## 2013-08-02 DIAGNOSIS — Z7901 Long term (current) use of anticoagulants: Secondary | ICD-10-CM

## 2013-08-02 DIAGNOSIS — I82409 Acute embolism and thrombosis of unspecified deep veins of unspecified lower extremity: Secondary | ICD-10-CM | POA: Diagnosis not present

## 2013-08-02 NOTE — Patient Instructions (Signed)
Patient instructed to take medications as defined in the Anti-coagulation Track section of this encounter.  Patient instructed to take today's dose. May give consideration to eating something dark, green and "leafy" today x 1 time only.Patient verbalized understanding of these instructions.

## 2013-08-02 NOTE — Progress Notes (Signed)
Anti-Coagulation Progress Note  Alisha [name] is a 51 y.o. female who is currently on an anti-coagulation regimen.    RECENT RESULTS: Recent results are below, the most recent result is correlated with a dose of 52.5 mg. per week: Lab Results  Component Value Date   INR 3.80 08/02/2013   INR 4.50 07/26/2013   INR 1.2 07/19/2013    ANTI-COAG DOSE: Anticoagulation Dose Instructions as of 08/02/2013     [name] Tue Wed Thu Fri Sat   New Dose 7.5 mg 3.75 mg 7.5 mg 7.5 mg 7.5 mg 7.5 mg 7.5 mg       ANTICOAG SUMMARY: Anticoagulation Episode Summary   Current INR goal 2.0-3.0  Next INR check 08/16/2013  INR from last check 3.80! (08/02/2013)  Weekly max dose   Target end date   [facility]  Preferred lab   Send INR reminders to ANTICOAG IMP   Indications  DVT [453.40] Long term current use of anticoagulant [V58.61]        Comments       Anticoagulation Care Providers   Provider Role Specialty Phone number   Zoila Shutter, MD  Internal Medicine [phone]      ANTICOAG TODAY: Anticoagulation Summary as of 08/02/2013   INR goal 2.0-3.0  Selected INR 3.80! (08/02/2013)  Next INR check 08/16/2013  Target end date    Indications  DVT [453.40] Long term current use of anticoagulant [V58.61]      Anticoagulation Episode Summary   [facility]   Preferred lab    Send INR reminders to ANTICOAG IMP   Comments     Anticoagulation Care Providers   Provider Role Specialty Phone number   Zoila Shutter, MD  Internal Medicine [phone]      PATIENT INSTRUCTIONS: Patient Instructions  Patient instructed to take medications as defined in the Anti-coagulation Track section of this encounter.  Patient instructed to take today's dose. May give consideration to eating something dark, green and "leafy" today x 1 time only.Patient verbalized understanding of these instructions.       FOLLOW-UP Return in 2 weeks (on 08/16/2013) for  Follow up INR at 1115h.  Hulen Luster, III Pharm.D., CACP

## 2013-08-05 ENCOUNTER — Telehealth: Payer: Self-pay | Admitting: *Deleted

## 2013-08-05 NOTE — Telephone Encounter (Signed)
I will see her. Thanks.

## 2013-08-05 NOTE — Telephone Encounter (Signed)
Pt calls c/o no one will call her something in for pain, that she keeps calling and calling and cant get any pain meds, discussed tramadol with her and that she cannot get a refill on it until 8/16 because last refill was 7/16 #120, she states she needs something stronger but that insurance will not pay for tramadol er, i read the note where she could have regular tramadol and then she states it does not help because she has a lot more clots now and her pain is much greater. i have scheduled an appt w/ dr Amie [place] 8/8 at [phone]

## 2013-08-06 ENCOUNTER — Encounter: Payer: Self-pay | Admitting: Internal Medicine

## 2013-08-06 ENCOUNTER — Ambulatory Visit: Payer: Medicare Other | Admitting: Internal Medicine

## 2013-08-16 ENCOUNTER — Ambulatory Visit (INDEPENDENT_AMBULATORY_CARE_PROVIDER_SITE_OTHER): Payer: Medicare Other | Admitting: Pharmacist

## 2013-08-16 DIAGNOSIS — Z7901 Long term (current) use of anticoagulants: Secondary | ICD-10-CM | POA: Diagnosis not present

## 2013-08-16 DIAGNOSIS — I82409 Acute embolism and thrombosis of unspecified deep veins of unspecified lower extremity: Secondary | ICD-10-CM

## 2013-08-16 LAB — POCT INR: INR: 3.4

## 2013-08-16 MED ORDER — WARFARIN SODIUM 7.5 MG PO TABS: ORAL_TABLET | ORAL | Status: DC

## 2013-08-16 NOTE — Progress Notes (Signed)
Anti-Coagulation Progress Note  Alisha [name] is a 52 y.o. female who is currently on an anti-coagulation regimen.    RECENT RESULTS: Recent results are below, the most recent result is correlated with a dose of 48.75 mg. per week: Lab Results  Component Value Date   INR 3.40 08/16/2013   INR 3.80 08/02/2013   INR 4.50 07/26/2013    ANTI-COAG DOSE: Anticoagulation Dose Instructions as of 08/16/2013     [name] Tue Wed Thu Fri Sat   New Dose 7.5 mg 3.75 mg 7.5 mg 7.5 mg 3.75 mg 7.5 mg 7.5 mg       ANTICOAG SUMMARY: Anticoagulation Episode Summary   Current INR goal 2.0-3.0  Next INR check 09/06/2013  INR from last check 3.40! (08/16/2013)  Weekly max dose   Target end date   [facility]  Preferred lab   Send INR reminders to ANTICOAG [facility]   Indications  DVT [453.40] Long term current use of anticoagulant [V58.61]        Comments       Anticoagulation Care Providers   Provider Role Specialty Phone number   Zoila Shutter, MD  Internal Medicine [phone]      ANTICOAG TODAY: Anticoagulation Summary as of 08/16/2013   INR goal 2.0-3.0  Selected INR 3.40! (08/16/2013)  Next INR check 09/06/2013  Target end date    Indications  DVT [453.40] Long term current use of anticoagulant [V58.61]      Anticoagulation Episode Summary   [facility] check [facility]   Preferred lab    Send INR reminders to ANTICOAG [facility]   Comments     Anticoagulation Care Providers   Provider Role Specialty Phone number   Zoila Shutter, MD  Internal Medicine [phone]      PATIENT INSTRUCTIONS: Patient Instructions  Patient instructed to take medications as defined in the Anti-coagulation Track section of this encounter.  Patient instructed to tke today's dose.  Patient verbalized understanding of these instructions.       FOLLOW-UP Return in 3 weeks (on 09/06/2013) for Follow up INR at 1115h.  Hulen Luster, III Pharm.D., CACP

## 2013-08-16 NOTE — Patient Instructions (Signed)
Patient instructed to take medications as defined in the Anti-coagulation Track section of this encounter.  Patient instructed to tke today's dose.  Patient verbalized understanding of these instructions.    

## 2013-08-17 NOTE — Progress Notes (Signed)
Indication: Recurrent deep venous thrombosis. Duration: Lifelong. INR above target. Agree with Dr. Saralyn Pilar assessment and plan as documented.

## 2013-08-18 ENCOUNTER — Encounter: Payer: Medicare Other | Admitting: Internal Medicine

## 2013-08-18 ENCOUNTER — Encounter: Payer: Self-pay | Admitting: Internal Medicine

## 2013-09-01 ENCOUNTER — Other Ambulatory Visit: Payer: Self-pay | Admitting: *Deleted

## 2013-09-01 ENCOUNTER — Encounter: Payer: Self-pay | Admitting: Vascular Surgery

## 2013-09-02 ENCOUNTER — Ambulatory Visit (INDEPENDENT_AMBULATORY_CARE_PROVIDER_SITE_OTHER): Payer: Medicare Other | Admitting: Vascular Surgery

## 2013-09-02 ENCOUNTER — Encounter: Payer: Self-pay | Admitting: Vascular Surgery

## 2013-09-02 ENCOUNTER — Encounter (INDEPENDENT_AMBULATORY_CARE_PROVIDER_SITE_OTHER): Payer: Medicare Other | Admitting: *Deleted

## 2013-09-02 VITALS — BP 151/95 | HR 70 | Ht 64.5 in | Wt 182.9 lb

## 2013-09-02 DIAGNOSIS — I70219 Atherosclerosis of native arteries of extremities with intermittent claudication, unspecified extremity: Secondary | ICD-10-CM | POA: Insufficient documentation

## 2013-09-02 DIAGNOSIS — I739 Peripheral vascular disease, unspecified: Secondary | ICD-10-CM | POA: Diagnosis not present

## 2013-09-02 DIAGNOSIS — M79609 Pain in unspecified limb: Secondary | ICD-10-CM | POA: Insufficient documentation

## 2013-09-02 NOTE — Progress Notes (Signed)
[facility]   HISTORY AND PHYSICAL   History of Present Illness: Patient is a 52 y.o. year old female who presents for evaluation of bilateral lower extremity pain. The patient previously underwent a left femoropopliteal bypass in 2009 which occluded shortly after placement. She also had problems healing upper left groin wound. Unfortunately she continues to smoke but she has cut back considerably. She was a 2 pack per day smoker and now is only smoking 3 cigarettes per day. However she has not been able to decrease this over the last 2 years. Greater than 3 minutes they were spent regarding smoking cessation counseling. She has lost some weight since the last time she was seen. She has also had a previous right external iliac artery stent. 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 she does wear compression stockings intermittently. She denies rest pain or non healing foot ulcers. She complains that her legs hurt when she is sitting to standing stooping or walking. She does not describe classic claudication symptoms of calf pain brought on by exercise and relieved with rest. She describes several diffuse aches pains and numbness in both lower extremities to extend from the hip down to the calf which is also not consistent with claudication. 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   Past Medical History  Diagnosis Date  . DVT (deep venous thrombosis)     bilateral, 2 episodes: Requires lifelong therapy  . Chronic pain syndrome   . Restless leg syndrome   . Depression   . Anxiety   . Hyperlipidemia   . Hypertension   . PVD (peripheral vascular disease)     s/p L fem-pop bypass 11/21/08; graft occluded 12/28/08;  aortogram w/ bilat LE runoff: 1.  bilat diffuse SFA occlusive dz, 2.  Mod  to severe above-knee popliteal dz,  3.  Bilat 3-vessel runoff w/ mild tibial occlusive dz.  . Tobacco abuse   . Degenerative joint disease of knee   . Breast lump 02/2008    Biopsy 05/2008: showed no evidence of malignancy  . Irregular menses 9/08  . Pulmonary edema   . Anxiety   . Joint pain   . Mixed stress and urge urinary incontinence     Being followed by [facility], underwent cystoscopy & uroflowmetry    Past Surgical History  Procedure Laterality Date  . Femoral bypass  11/09  .  right external iliac artery stent    . Right breast needle-localized lumpectomy    . Pr vein bypass graft,aorto-fem-pop    . Carotid endarterectomy      ROS: [x]  Positive [ ]  Negative [ ]  All sytems reviewed and are negative  General:[ ]  Weight loss, [ ]  Fever, [ ]  chills  Neurologic: [x ] Dizziness, [ ]  Blackouts, [ ]  Seizure  [ ]  Stroke, [ ]  "Mini stroke", [ ]  Slurred speech, [ ]  Temporary blindness; [ ] weakness, [ ]  Hoarseness  Cardiac: [ ]  Chest pain/pressure, [ ]  Shortness of breath at rest [ ]  Shortness of breath with exertion, [ ]  Atrial fibrillation or irregular heartbeat  Vascular:[x ] Pain in legs with walking, [ x] Pain in legs at rest ,[ ]  Pain in legs at night,  [ ]  Non-healing ulcer, [x ] Blood clot in vein/DVT(on lifelong coumadin,  Pulmonary: [ ]  Home oxygen, [ ]  Productive cough, [ ]  Coughing up blood, [ ]   Asthma,  [ ]  Wheezing  Musculoskeletal: [ x] Arthritis, [x ] Low back pain, [x ] Joint pain  Hematologic:[ ]  Easy Bruising, [ ]  Anemia; [ ]  Hepatitis  Gastrointestinal: [ ]  Blood in stool, [ ]  Gastroesophageal Reflux, [ ]  Trouble swallowing  Urinary: [ ]  chronic Kidney disease, [ ]  on HD - [ ]  MWF or [ ]  TTHS, [ ]  Burning with urination, [ ]  Frequent urination, [ ]  Difficulty urinating;  Skin: [ ]  Rashes, [ ]  Wounds  Psychological: [x ] Anxiety, [x ] Depression   Social History  History   Substance Use Topics   .  Smoking status:  Current Everyday Smoker -- 0.2 packs/day      Types:  Cigarettes   .  Smokeless tobacco:  Never Used   .  Alcohol Use:  No    Family History  Denies early family history or stroke or myocardial infarction   Allergies  Allergies   Allergen  Reactions   .  Propoxyphene N-Acetaminophen     Current Outpatient Prescriptions   Medication  Sig  Dispense  Refill   .  albuterol (PROAIR HFA) 108 (90 BASE) MCG/ACT inhaler  Inhale 2 puffs into the lungs every 6 (six) hours as needed for wheezing. Take 1-2 puffs as needed every 4-6 hours as needed for shortness of breath  1 Inhaler  3   .  amitriptyline (ELAVIL) 25 MG tablet  Take 1 tablet (25 mg total) by mouth at bedtime.  30 tablet  0   .  amLODipine (NORVASC) 10 MG tablet  Take 1 tablet (10 mg total) by mouth daily.  30 tablet  11   .  aspirin 81 MG tablet  Take 1 tablet (81 mg total) by mouth daily.  100 tablet  3   .  atorvastatin (LIPITOR) 40 MG tablet  Take 1 tablet (40 mg total) by mouth daily.  31 tablet  11   .  benzonatate (TESSALON PERLES) 100 MG capsule  Take 1 capsule (100 mg total) by mouth every 6 (six) hours as needed for cough.  30 capsule  1   .  carvedilol (COREG) 25 MG tablet  Take 1 tablet (25 mg total) by mouth 2 (two) times daily.  60 tablet  11   .  diazepam (VALIUM) 5 MG tablet  Take 5 mg by mouth every 8 (eight) hours as needed.     .  DULoxetine (CYMBALTA) 60 MG capsule  Take 60 mg by mouth. Take 1 capsule in the morning and 1 capsule at bedtime     .  furosemide (LASIX) 40 MG tablet  Take 1 tablet (40 mg total) by mouth daily.  30 tablet  0   .  gabapentin (NEURONTIN) 800 MG tablet  Take 1 pill by mouth in the morning, 1 pill by mouth at midday, and 2 pills by mouth at night  120 tablet  11   .  hydrochlorothiazide 25 MG tablet  Take 25 mg by mouth daily.     Marland Kitchen  lisinopril (PRINIVIL,ZESTRIL) 40 MG tablet  Take 1 tablet (40 mg total) by mouth daily.  30 tablet  6   .  meloxicam (MOBIC) 7.5 MG tablet  Take 1 tablet (7.5 mg total) by mouth daily.  30 tablet  0   .   potassium chloride (KLOR-CON) 10 MEQ CR tablet  Take 1 tablet (10 mEq total) by mouth 2 (two) times daily.  60 tablet  11   .  QUEtiapine (SEROQUEL) 100 MG tablet  Take 300 mg by mouth at bedtime.     .  traMADol (ULTRAM) 50 MG tablet  Take 1 tablet (50 mg total) by mouth every 6 (six) hours as needed for pain.  120 tablet  3   .  warfarin (COUMADIN) 7.5 MG tablet  Take as directed from anticoagulation clinic.  30 tablet  2   .  ziprasidone (GEODON) 60 MG capsule  Take 60 mg by mouth. Take 1capsule by mouth in the morning and 2 capsules at night     Physical Examination    Filed Vitals:   09/02/13 1403  BP: 151/95  Pulse: 70  Height: 5' 4.5" (1.638 m)  Weight: 182 lb 14.4 oz (82.963 kg)  SpO2: 100%  General: Alert and oriented, no acute distress, however she becomes quite tearful when discussing her legs which is very similar to prior office visits HEENT: Normal  Neck: No bruit or JVD  Pulmonary: Clear to auscultation bilaterally  Cardiac: Regular Rate and Rhythm without murmur  Abdomen: Soft, non-tender, non-distended, no mass, no scars, obese  Skin: No rash, no ulcers Extremity Pulses: 2+ radial, brachial, femoral,absent dorsalis pedis, posterior tibial pulses bilaterally  Musculoskeletal: No deformity or edema  Neurologic: Upper and lower extremity motor 5/5 and symmetric   DATA: ABIs today are 0.66 on the right 0.73 on the left these are unchanged over the last 5 years and had been very consistent  ASSESSMENT: Bilateral lower extremity leg pain. Multifactorial in nature. The patient previously had a left leg bypass which was short lived. She currently has leg pain but I'm not sure we can explain all of this through peripheral arterial disease. She experiences pain that is positional in nature as well as with ambulation. She also experiences hip pain and thigh pain which would not be consistent with her arterial anatomy.Marland Kitchen However she is not a very good bypass candidate due to the fact  that she is obese, she has previously had a left groin infection, she has been noncompliant in the past. She also smoking and continues to abuse tobacco products. When conversing with her she seems desperate to make her legs better but she is completely unwilling to do simple things such as quit smoking which she has been counseled today over the last 5 years. She has had no decline in her ABIs over the past 5 years and essentially her symptoms are unchanged over the last 5 years. I would not consider a bypass on her unless she can become more compliant with smoking cessation and a walking program. She also needs to lose weight. If however she diminished to the point where she had limb threatening ischemia we would consider revascularization. However, she currently is not at that point. She will followup with [name] in 6 months time with ABIs.  I also discussed with her that there are other possible causes for some of her leg pain symptoms. These included her fibromyalgia. She could also have I nerve etiology for her pain. I have also discussed with her the possibility of being evaluated in the pain clinic. Unfortunately she seems very fixated that all of her problems are related to her blood vessels despite my emphasizing to her that the arterial occlusive disease his only part of her problem. I also emphasized to her that durability of any intervention for leg pain alone would be limited. I also discussed with her that considering an intervention for her leg pain but probably not improve  her symptoms as these are multifactorial   PLAN: See above  Fabienne Bruns, MD  [facility]  Office: [phone]  Pager: [phone]

## 2013-09-06 ENCOUNTER — Ambulatory Visit (INDEPENDENT_AMBULATORY_CARE_PROVIDER_SITE_OTHER): Payer: Medicare Other | Admitting: Pharmacist

## 2013-09-06 DIAGNOSIS — I82409 Acute embolism and thrombosis of unspecified deep veins of unspecified lower extremity: Secondary | ICD-10-CM | POA: Diagnosis not present

## 2013-09-06 DIAGNOSIS — Z7901 Long term (current) use of anticoagulants: Secondary | ICD-10-CM

## 2013-09-06 NOTE — Telephone Encounter (Signed)
Patient needs to keep appointment; also, please find out what happened with the pain clinic referral.

## 2013-09-06 NOTE — Patient Instructions (Signed)
Patient instructed to take medications as defined in the Anti-coagulation Track section of this encounter.  Patient instructed to take today's dose.  Patient verbalized understanding of these instructions.    

## 2013-09-06 NOTE — Progress Notes (Signed)
Anti-Coagulation Progress Note  Alisha [name] is a 52 y.o. female who is currently on an anti-coagulation regimen.    RECENT RESULTS: Recent results are below, the most recent result is correlated with a dose of 45 mg. per week: Lab Results  Component Value Date   INR 1.30 09/06/2013   INR 3.40 08/16/2013   INR 3.80 08/02/2013    ANTI-COAG DOSE: Anticoagulation Dose Instructions as of 09/06/2013     [name] Tue Wed Thu Fri Sat   New Dose 3.75 mg 7.5 mg 7.5 mg 7.5 mg 7.5 mg 7.5 mg 7.5 mg       ANTICOAG SUMMARY: Anticoagulation Episode Summary   Current INR goal 2.0-3.0  Next INR check 09/13/2013  INR from last check 1.30! (09/06/2013)  Weekly max dose   Target end date   [facility]  Preferred lab   Send INR reminders to ANTICOAG IMP   Indications  DVT [453.40] Long term current use of anticoagulant [V58.61]        Comments       Anticoagulation Care Providers   Provider Role Specialty Phone number   Zoila Shutter, MD  Internal Medicine [phone]      ANTICOAG TODAY: Anticoagulation Summary as of 09/06/2013   INR goal 2.0-3.0  Selected INR 1.30! (09/06/2013)  Next INR check 09/13/2013  Target end date    Indications  DVT [453.40] Long term current use of anticoagulant [V58.61]      Anticoagulation Episode Summary   [facility]   Preferred lab    Send INR reminders to ANTICOAG IMP   Comments     Anticoagulation Care Providers   Provider Role Specialty Phone number   Zoila Shutter, MD  Internal Medicine [phone]      PATIENT INSTRUCTIONS: Patient Instructions  Patient instructed to take medications as defined in the Anti-coagulation Track section of this encounter.  Patient instructed to take today's dose.  Patient verbalized understanding of these instructions.       FOLLOW-UP Return in 7 days (on 09/13/2013) for Follow up INR at 1130h.  Hulen Luster, III Pharm.D., CACP

## 2013-09-07 ENCOUNTER — Other Ambulatory Visit: Payer: Self-pay | Admitting: Family Medicine

## 2013-09-07 DIAGNOSIS — I739 Peripheral vascular disease, unspecified: Secondary | ICD-10-CM

## 2013-09-07 DIAGNOSIS — H43813 Vitreous degeneration, bilateral: Secondary | ICD-10-CM

## 2013-09-08 MED ORDER — TRAMADOL HCL 50 MG PO TABS: 100.0000 mg | ORAL_TABLET | Freq: Four times a day (QID) | ORAL | Status: DC | PRN

## 2013-09-08 NOTE — Addendum Note (Signed)
Addended by: Belia Heman on: 09/08/2013 11:16 PM   Modules accepted: Orders

## 2013-09-08 NOTE — Telephone Encounter (Signed)
Dr Everardo Beals,  Pt's last refill was on 08/11/2013. She has an appt with you on 09/16/13. She was originally taking tramadol 50 mg tabs as insurance would not cover the 100mg  extended release tab. Could office provide rx to last until pt is seen? Criss Alvine, Darlene Cassady9/10/201412:31 PM

## 2013-09-09 NOTE — Telephone Encounter (Signed)
Tramadol 50mg  #32tabs called to [facility]; pt was called and informed.

## 2013-09-13 ENCOUNTER — Ambulatory Visit (INDEPENDENT_AMBULATORY_CARE_PROVIDER_SITE_OTHER): Payer: Medicare Other | Admitting: Pharmacist

## 2013-09-13 DIAGNOSIS — Z7901 Long term (current) use of anticoagulants: Secondary | ICD-10-CM

## 2013-09-13 DIAGNOSIS — I82409 Acute embolism and thrombosis of unspecified deep veins of unspecified lower extremity: Secondary | ICD-10-CM

## 2013-09-13 MED ORDER — WARFARIN SODIUM 7.5 MG PO TABS: ORAL_TABLET | ORAL | Status: DC

## 2013-09-13 NOTE — Progress Notes (Signed)
Anti-Coagulation Progress Note  Alisha [name] is a 52 y.o. female who is currently on an anti-coagulation regimen.    RECENT RESULTS: Recent results are below, the most recent result is correlated with a dose of 48.75 mg. per week: Lab Results  Component Value Date   INR 1.50 09/13/2013   INR 1.30 09/06/2013   INR 3.40 08/16/2013    ANTI-COAG DOSE: Anticoagulation Dose Instructions as of 09/13/2013     [name] Tue Wed Thu Fri Sat   New Dose 7.5 mg 11.25 mg 7.5 mg 7.5 mg 7.5 mg 7.5 mg 7.5 mg       ANTICOAG SUMMARY: Anticoagulation Episode Summary   Current INR goal 2.0-3.0  Next INR check 09/27/2013  INR from last check 1.50! (09/13/2013)  Weekly max dose   Target end date   [facility]  Preferred lab   Send INR reminders to ANTICOAG IMP   Indications  DVT [453.40] Long term current use of anticoagulant [V58.61]        Comments       Anticoagulation Care Providers   Provider Role Specialty Phone number   Zoila Shutter, MD  Internal Medicine [phone]      ANTICOAG TODAY: Anticoagulation Summary as of 09/13/2013   INR goal 2.0-3.0  Selected INR 1.50! (09/13/2013)  Next INR check 09/27/2013  Target end date    Indications  DVT [453.40] Long term current use of anticoagulant [V58.61]      Anticoagulation Episode Summary   [facility]   Preferred lab    Send INR reminders to ANTICOAG IMP   Comments     Anticoagulation Care Providers   Provider Role Specialty Phone number   Zoila Shutter, MD  Internal Medicine [phone]      PATIENT INSTRUCTIONS: Patient Instructions  Patient instructed to take medications as defined in the Anti-coagulation Track section of this encounter.  Patient instructed to take today's dose.  Patient verbalized understanding of these instructions.       FOLLOW-UP Return in 2 weeks (on 09/27/2013) for Follow up INR at 1100h.  Hulen Luster, III Pharm.D., CACP

## 2013-09-13 NOTE — Patient Instructions (Signed)
Patient instructed to take medications as defined in the Anti-coagulation Track section of this encounter.  Patient instructed to take today's dose.  Patient verbalized understanding of these instructions.    

## 2013-09-16 ENCOUNTER — Ambulatory Visit (INDEPENDENT_AMBULATORY_CARE_PROVIDER_SITE_OTHER): Payer: Medicare Other | Admitting: Internal Medicine

## 2013-09-16 ENCOUNTER — Encounter: Payer: Self-pay | Admitting: Internal Medicine

## 2013-09-16 VITALS — BP 174/97 | HR 67 | Temp 96.9°F | Ht 64.0 in | Wt 184.0 lb

## 2013-09-16 DIAGNOSIS — Z1239 Encounter for other screening for malignant neoplasm of breast: Secondary | ICD-10-CM | POA: Diagnosis not present

## 2013-09-16 DIAGNOSIS — G894 Chronic pain syndrome: Secondary | ICD-10-CM

## 2013-09-16 DIAGNOSIS — Z299 Encounter for prophylactic measures, unspecified: Secondary | ICD-10-CM

## 2013-09-16 DIAGNOSIS — Z23 Encounter for immunization: Secondary | ICD-10-CM | POA: Diagnosis not present

## 2013-09-16 DIAGNOSIS — I739 Peripheral vascular disease, unspecified: Secondary | ICD-10-CM

## 2013-09-16 DIAGNOSIS — Z0000 Encounter for general adult medical examination without abnormal findings: Secondary | ICD-10-CM | POA: Insufficient documentation

## 2013-09-16 DIAGNOSIS — I10 Essential (primary) hypertension: Secondary | ICD-10-CM | POA: Diagnosis not present

## 2013-09-16 MED ORDER — HYDRALAZINE HCL 10 MG PO TABS: 10.0000 mg | ORAL_TABLET | Freq: Three times a day (TID) | ORAL | Status: DC

## 2013-09-16 MED ORDER — TRAMADOL HCL 50 MG PO TABS: 100.0000 mg | ORAL_TABLET | Freq: Four times a day (QID) | ORAL | Status: DC | PRN

## 2013-09-16 NOTE — Assessment & Plan Note (Signed)
BP Readings from Last 3 Encounters:  09/16/13 174/97  09/02/13 151/95  07/16/13 170/90    Lab Results  Component Value Date   NA 141 07/16/2013   K 4.4 07/16/2013   CREATININE 0.83 07/16/2013    Assessment: Blood pressure control: moderately elevated Progress toward BP goal:  unchanged  Plan: Medications:  she reports compliance with amlodipine 10, coreg 25 bid, hctz 25 and lisinopril 40; we started hydralazine 10mg  TID as it is a vasodilator, but may consider changing to imdur given once daily dosing Educational resources provided: brochure;handout;video

## 2013-09-16 NOTE — Assessment & Plan Note (Addendum)
Patient reports Tramadol is not effective, yet is not maximizing it at all - I filled 32 pills for her last week, just enough to get her to her appt today, but she still had 18 pills remaining. She is resistant to NSAIDs and lidoderm patch (which have been tried in the past).  She would benefit from pain clinic eval as she may benefit from narcotic therapy. She has been declined from one clinic, but we sent the referral to another clinic today. Her BP is elevated today, and the thought of a serotonin type syndrome was brought up, but she has been taking tramadol for years, and she is obviously not maximizing it, so I think this is less likely at this point.  She has had a negative ANA in the past, but may consider repeat at next follow up.  Her pain is likely multifactorial, but Dr. Darrick Penna does not believe her PAD is the sole reason for her pain. She has chronic DVTs for which she is on coumadin. I'm sure a component of neuropathy exists, and she has only been taking gabapentin twice daily instead of 3 times daily.   -Encourage gabapentin TID -Refilled tramadol 50mg  tabs, 1-2 tab q6h prn #100 (enough to last until hopefully a pain clinic appt) -Encouraged smoking cessation

## 2013-09-16 NOTE — Progress Notes (Signed)
Subjective:   Patient ID: Alisha [name] female   DOB: 01/15/1961 52 y.o.   MRN: [id number]  HPI: Alisha [name] is a 52 y.o. woman with past medical history significant for peripheral vascular disease, history of DVTs, restless leg syndrome, chronic pain syndrome, DJD, depression, and anxiety.  She presents today for chronic b/l LE pain (she is s/p left fempop in 2009, which occluded shortly after), for which she has been treated with tramadol. She feels that tramadol does not relieve her pain. She continues to smoke. Pain is worse with walking, but better with rest.  She is followed by [facility], but Dr. Darrick Penna feels that all of her pain is not likely due to PAD. In any case, she is not a good bypass candidate.    Review of Systems: Constitutional: Denies fever, chills, diaphoresis, appetite change HEENT: Denies photophobia, eye pain, redness, hearing loss, ear pain, congestion, sore throat, rhinorrhea, sneezing, mouth sores, trouble swallowing, neck pain, neck stiffness and tinnitus.  Respiratory: Denies SOB, DOE, cough, chest tightness, and wheezing.  Cardiovascular: Denies chest pain, palpitations and leg swelling.  Gastrointestinal: Denies nausea, vomiting, abdominal pain, diarrhea, constipation,blood in stool and abdominal distention.  Genitourinary: Denies dysuria, urgency, frequency, hematuria, flank pain and difficulty urinating.  Neurological: Denies dizziness, seizures, syncope, weakness, lightheadedness, numbness and headaches.   Past Medical History  Diagnosis Date  . DVT (deep venous thrombosis)     bilateral, 2 episodes: Requires lifelong therapy  . Chronic pain syndrome   . Restless leg syndrome   . Depression   . Anxiety   . Hyperlipidemia   . Hypertension   . PVD (peripheral vascular disease)     s/p L fem-pop bypass 11/21/08; graft occluded 12/28/08;  aortogram w/ bilat LE runoff: 1.  bilat diffuse SFA occlusive dz, 2.  Mod to severe above-knee popliteal dz,  3.  Bilat  3-vessel runoff w/ mild tibial occlusive dz.  . Tobacco abuse   . Degenerative joint disease of knee   . Breast lump 02/2008    Biopsy 05/2008: showed no evidence of malignancy  . Irregular menses 9/08  . Pulmonary edema   . Anxiety   . Joint pain   . Mixed stress and urge urinary incontinence     [facility], underwent cystoscopy & uroflowmetry    Current Outpatient Prescriptions  Medication Sig Dispense Refill  . albuterol (PROAIR HFA) 108 (90 BASE) MCG/ACT inhaler Inhale 2 puffs into the lungs every 6 (six) hours as needed for wheezing. Take 1-2 puffs as needed every 4-6 hours as needed for shortness of breath  1 Inhaler  3  . amLODipine (NORVASC) 10 MG tablet Take 1 tablet (10 mg total) by mouth daily.  30 tablet  11  . aspirin 81 MG tablet Take 1 tablet (81 mg total) by mouth daily.  100 tablet  3  . atorvastatin (LIPITOR) 40 MG tablet Take 1 tablet (40 mg total) by mouth at bedtime.  90 tablet  3  . buPROPion (WELLBUTRIN XL) 300 MG 24 hr tablet Take 300 mg by mouth every morning.      . carvedilol (COREG) 25 MG tablet Take 1 tablet (25 mg total) by mouth 2 (two) times daily.  180 tablet  1  . diazepam (VALIUM) 5 MG tablet Take 10 mg by mouth every 8 (eight) hours as needed.       . DULoxetine (CYMBALTA) 60 MG capsule Take 60 mg by mouth. Take 1 capsule in the  morning and 1 capsule at bedtime       . gabapentin (NEURONTIN) 800 MG tablet TAKE ONE TABLET BY MOUTH IN THE MORNING--1 AT MIDDAY--& 2 AT NIGHT  120 tablet  11  . hydrochlorothiazide (HYDRODIURIL) 25 MG tablet TAKE ONE TABLET BY MOUTH ONE TIME DAILY  30 tablet  1  . lisinopril (PRINIVIL,ZESTRIL) 40 MG tablet Take 1 tablet (40 mg total) by mouth daily.  30 tablet  6  . megestrol (MEGACE) 40 MG tablet Take 1 tablet (40 mg total) by mouth daily.  30 tablet  1  . traMADol (ULTRAM) 50 MG tablet Take 2 tablets (100 mg total) by mouth every 6 (six) hours as needed for pain.  32 tablet  0  . traZODone (DESYREL) 100  MG tablet Take 100-200 mg by mouth at bedtime.      Marland Kitchen warfarin (COUMADIN) 7.5 MG tablet Take one tablet daily except Mondays, then take 1 and 1/2 tablets.  30 tablet  2  . ziprasidone (GEODON) 60 MG capsule Take 60 mg by mouth 2 (two) times daily with a meal.        No current facility-administered medications for this visit.   Family History  Problem Relation Age of Onset  . Cancer Mother     breast   History   Social History  . Marital Status: Divorced    Spouse Name: N/A    Number of Children: N/A  . Years of Education: N/A   Social History Main Topics  . Smoking status: Current Every Day Smoker -- 0.25 packs/day for 18 years    Types: Cigarettes  . Smokeless tobacco: Never Used  . Alcohol Use: No  . Drug Use: No  . Sexual Activity: No   Other Topics Concern  . None   Social History Narrative   Software engineer initiated. Patient needs to submit further paperwork to complete   [facility]  September 11, 2010 2:04 PM   Financial assistance approved for 100% discount at [facility] and has [facility] card   Rudell Cobb  October 04, 2010 5:29 PM    Objective:  Physical Exam: Ceasar Mons Vitals:   09/16/13 1030  BP: 177/109  Pulse: 67  Temp: 96.9 F (36.1 C)  TempSrc: Oral  Height: 5\' 4"  (1.626 m)  Weight: 184 lb (83.462 kg)  SpO2: 99%   General: appears as stated age HEENT: PERRL, EOMI, no scleral icterus Cardiac: RRR, no rubs, murmurs or gallops Pulm: clear to auscultation bilaterally, moving normal volumes of air Abd: soft, nontender, nondistended, BS normoactive Ext: warm and well perfused, no pedal edema Neuro: alert and oriented X3, cranial nerves II-XII grossly intact  Assessment & Plan:  Case and care discussed with Dr. Dalphine Handing.  Please see problem oriented charting for further details.

## 2013-09-16 NOTE — Patient Instructions (Addendum)
General Instructions:  -For your blood pressure, please add hydralazine 10mg  three times a day -Continue to follow with Dr. Alexandria Lodge for your coumadin checks -We are giving you your flu shot today -I have referred you for your mammogram today -I'll talk to [facility] about your pain clinic referral, for now we will continue tramadol 100mg  every 6 hours as needed as well as gabapentin THREE times a day  Please be sure to bring all of your medications with you to every visit.  Should you have any new or worsening symptoms, please be sure to call the clinic at [phone].  Treatment Goals:  Goals (1 Years of Data) as of 09/16/13         As of Today 09/02/13 07/16/13 07/16/13 11/10/12     Blood Pressure    . Blood Pressure < 140/90  177/109 151/95 170/90 177/99 140/91      Progress Toward Treatment Goals:  Treatment Goal 09/16/2013  Blood pressure unchanged  Stop smoking smoking the same amount  Prevent falls -    Self Care Goals & Plans:  Self Care Goal 09/16/2013  Manage my medications take my medicines as prescribed; bring my medications to every visit; refill my medications on time; follow the sick day instructions if I am sick  Eat healthy foods eat more vegetables; eat fruit for snacks and desserts; eat smaller portions  Be physically active find an activity I enjoy

## 2013-09-16 NOTE — Assessment & Plan Note (Addendum)
Encourage mammo (inital order 05/08/12) & colonoscopy (initial referral 10/12/12) at next visit

## 2013-09-17 ENCOUNTER — Other Ambulatory Visit: Payer: Self-pay | Admitting: *Deleted

## 2013-09-17 NOTE — Progress Notes (Addendum)
Call from pt - pt states Tramadol was not called to the pharmacy; Tramadol rx qty# 100 called to [facility].

## 2013-09-17 NOTE — Telephone Encounter (Signed)
Refill called in. 

## 2013-09-17 NOTE — Progress Notes (Signed)
Case discussed with Dr. Sharda at the time of the visit.  We reviewed the resident's history and exam and pertinent patient test results.  I agree with the assessment, diagnosis, and plan of care documented in the resident's note.    

## 2013-09-21 ENCOUNTER — Other Ambulatory Visit: Payer: Self-pay | Admitting: Internal Medicine

## 2013-09-24 ENCOUNTER — Telehealth: Payer: Self-pay | Admitting: Licensed Clinical Social Worker

## 2013-09-24 NOTE — Telephone Encounter (Signed)
CSW returned call to Ms. Alisha [name].  Pt states she is in need of housing as she is currently staying with friends/family until her disability check arrives. Pt declines need for assisted living resources. Pt is currently working with [facility] and has contacted [facility], [employer] representative and [employer].  CSW informed [name] the [facility] is an excellent resource for her and to continue working with them, explaining to [name] this CSW can only provide listing of apartments.  Pt aware and requesting CSW to provide several apt options under $350/month.  CSW utilized [email or web address] to provide pt with listing.  [name] is aware CSW is available to assist as needed.

## 2013-09-27 ENCOUNTER — Ambulatory Visit (INDEPENDENT_AMBULATORY_CARE_PROVIDER_SITE_OTHER): Payer: Medicare Other | Admitting: Pharmacist

## 2013-09-27 DIAGNOSIS — Z7901 Long term (current) use of anticoagulants: Secondary | ICD-10-CM | POA: Diagnosis not present

## 2013-09-27 DIAGNOSIS — I82409 Acute embolism and thrombosis of unspecified deep veins of unspecified lower extremity: Secondary | ICD-10-CM

## 2013-09-27 LAB — POCT INR: INR: 1.6

## 2013-09-27 MED ORDER — WARFARIN SODIUM 7.5 MG PO TABS: ORAL_TABLET | ORAL | Status: DC

## 2013-09-27 NOTE — Patient Instructions (Signed)
Patient instructed to take medications as defined in the Anti-coagulation Track section of this encounter.  Patient instructed to take today's dose.  Patient verbalized understanding of these instructions.    

## 2013-09-27 NOTE — Progress Notes (Signed)
Anti-Coagulation Progress Note  Alisha [name] is a 51 y.o. female who is currently on an anti-coagulation regimen.    RECENT RESULTS: Recent results are below, the most recent result is correlated with a dose of 56.25 mg. per week: Lab Results  Component Value Date   INR 1.60 09/27/2013   INR 1.50 09/13/2013   INR 1.30 09/06/2013    ANTI-COAG DOSE: Anticoagulation Dose Instructions as of 09/27/2013     [name] Tue Wed Thu Fri Sat   New Dose 7.5 mg 11.25 mg 7.5 mg 11.25 mg 7.5 mg 11.25 mg 7.5 mg       ANTICOAG SUMMARY: Anticoagulation Episode Summary   Current INR goal 2.0-3.0  Next INR check 10/11/2013  INR from last check 1.60! (09/27/2013)  Weekly max dose   Target end date   [facility]  Preferred lab   Send INR reminders to ANTICOAG IMP   Indications  DVT [453.40] Long term current use of anticoagulant [V58.61]        Comments       Anticoagulation Care Providers   Provider Role Specialty Phone number   Zoila Shutter, MD  Internal Medicine [phone]      ANTICOAG TODAY: Anticoagulation Summary as of 09/27/2013   INR goal 2.0-3.0  Selected INR 1.60! (09/27/2013)  Next INR check 10/11/2013  Target end date    Indications  DVT [453.40] Long term current use of anticoagulant [V58.61]      Anticoagulation Episode Summary   [facility]   Preferred lab    Send INR reminders to ANTICOAG IMP   Comments     Anticoagulation Care Providers   Provider Role Specialty Phone number   Zoila Shutter, MD  Internal Medicine [phone]      PATIENT INSTRUCTIONS: Patient Instructions  Patient instructed to take medications as defined in the Anti-coagulation Track section of this encounter.  Patient instructed to take today's dose.  Patient verbalized understanding of these instructions.       FOLLOW-UP Return in 2 weeks (on 10/11/2013) for Follow up INR at 1115h.  Hulen Luster, III Pharm.D., CACP

## 2013-10-08 ENCOUNTER — Other Ambulatory Visit: Payer: Self-pay | Admitting: *Deleted

## 2013-10-08 DIAGNOSIS — I739 Peripheral vascular disease, unspecified: Secondary | ICD-10-CM

## 2013-10-11 ENCOUNTER — Ambulatory Visit (INDEPENDENT_AMBULATORY_CARE_PROVIDER_SITE_OTHER): Payer: Medicare Other | Admitting: Pharmacist

## 2013-10-11 DIAGNOSIS — I82409 Acute embolism and thrombosis of unspecified deep veins of unspecified lower extremity: Secondary | ICD-10-CM

## 2013-10-11 DIAGNOSIS — Z7901 Long term (current) use of anticoagulants: Secondary | ICD-10-CM

## 2013-10-11 LAB — POCT INR: INR: 2.1

## 2013-10-11 MED ORDER — WARFARIN SODIUM 7.5 MG PO TABS: ORAL_TABLET | ORAL | Status: DC

## 2013-10-11 NOTE — Patient Instructions (Signed)
Patient instructed to take medications as defined in the Anti-coagulation Track section of this encounter.  Patient instructed to take today's dose.  Patient verbalized understanding of these instructions.    

## 2013-10-11 NOTE — Telephone Encounter (Signed)
Last refill per pharmacy 9/19.  Pt requesting early.

## 2013-10-11 NOTE — Progress Notes (Signed)
Indication: Recurrent venous thromboembolism. Duration: Lifelong. INR: At target. Agree with Dr. Groce's assessment and plan. 

## 2013-10-11 NOTE — Progress Notes (Signed)
Anti-Coagulation Progress Note  Alisha [name] is a 52 y.o. female who is currently on an anti-coagulation regimen.    RECENT RESULTS: Recent results are below, the most recent result is correlated with a dose of 63.75 mg. per week: Lab Results  Component Value Date   INR 2.10 10/11/2013   INR 1.60 09/27/2013   INR 1.50 09/13/2013    ANTI-COAG DOSE: Anticoagulation Dose Instructions as of 10/11/2013     [name] Tue Wed Thu Fri Sat   New Dose 7.5 mg 11.25 mg 11.25 mg 11.25 mg 11.25 mg 11.25 mg 7.5 mg       ANTICOAG SUMMARY: Anticoagulation Episode Summary   Current INR goal 2.0-3.0  Next INR check 11/01/2013  INR from last check 2.10 (10/11/2013)  Weekly max dose   Target end date   [facility]  Preferred lab   Send INR reminders to ANTICOAG IMP   Indications  DVT [453.40] Long term current use of anticoagulant [V58.61]        Comments       Anticoagulation Care Providers   Provider Role Specialty Phone number   Zoila Shutter, MD  Internal Medicine [phone]      ANTICOAG TODAY: Anticoagulation Summary as of 10/11/2013   INR goal 2.0-3.0  Selected INR 2.10 (10/11/2013)  Next INR check 11/01/2013  Target end date    Indications  DVT [453.40] Long term current use of anticoagulant [V58.61]      Anticoagulation Episode Summary   [facility]   Preferred lab    Send INR reminders to ANTICOAG IMP   Comments     Anticoagulation Care Providers   Provider Role Specialty Phone number   Zoila Shutter, MD  Internal Medicine [phone]      PATIENT INSTRUCTIONS: Patient Instructions  Patient instructed to take medications as defined in the Anti-coagulation Track section of this encounter.  Patient instructed to take today's dose.  Patient verbalized understanding of these instructions.       FOLLOW-UP Return in 3 weeks (on 11/01/2013) for Follow up INR at 1130h.  Hulen Luster, III Pharm.D., CACP

## 2013-10-12 ENCOUNTER — Ambulatory Visit
Admission: RE | Admit: 2013-10-12 | Discharge: 2013-10-12 | Disposition: A | Discharge status: Home / Self Care | Payer: Medicare Other | Source: Ambulatory Visit | Attending: Internal Medicine | Admitting: Internal Medicine

## 2013-10-12 ENCOUNTER — Ambulatory Visit: Payer: Medicare Other

## 2013-10-12 DIAGNOSIS — Z1239 Encounter for other screening for malignant neoplasm of breast: Secondary | ICD-10-CM

## 2013-10-13 ENCOUNTER — Other Ambulatory Visit: Payer: Self-pay | Admitting: Internal Medicine

## 2013-10-13 ENCOUNTER — Ambulatory Visit (INDEPENDENT_AMBULATORY_CARE_PROVIDER_SITE_OTHER): Payer: Medicare Other | Admitting: Internal Medicine

## 2013-10-13 VITALS — BP 191/118 | HR 76 | Temp 98.1°F

## 2013-10-13 DIAGNOSIS — G894 Chronic pain syndrome: Secondary | ICD-10-CM

## 2013-10-13 DIAGNOSIS — I10 Essential (primary) hypertension: Secondary | ICD-10-CM

## 2013-10-13 DIAGNOSIS — I739 Peripheral vascular disease, unspecified: Secondary | ICD-10-CM

## 2013-10-13 DIAGNOSIS — I82409 Acute embolism and thrombosis of unspecified deep veins of unspecified lower extremity: Secondary | ICD-10-CM | POA: Diagnosis not present

## 2013-10-13 DIAGNOSIS — Z7901 Long term (current) use of anticoagulants: Secondary | ICD-10-CM | POA: Diagnosis not present

## 2013-10-13 MED ORDER — LISINOPRIL-HYDROCHLOROTHIAZIDE 20-12.5 MG PO TABS: 2.0000 | ORAL_TABLET | Freq: Every day | ORAL | Status: DC

## 2013-10-13 MED ORDER — TRAMADOL HCL 50 MG PO TABS: 100.0000 mg | ORAL_TABLET | Freq: Four times a day (QID) | ORAL | Status: DC | PRN

## 2013-10-13 MED ORDER — AMLODIPINE BESYLATE 10 MG PO TABS: 10.0000 mg | ORAL_TABLET | Freq: Every day | ORAL | Status: DC

## 2013-10-13 NOTE — Patient Instructions (Signed)
-  I have changed your blood pressure pill to include 2 in one - that is you will take 2 pills of a lisinopril-hydrochlorothiazide combination pill (this should be more affordable rather than lisinopril and hydrochlorothiazide separately.)  -I have refilled 2 weeks of tramadol, but you can only fill this script with lisinopril-hydrochlorothiazide  -Please pick up amlodipine & coreg when you can afford it as soon as possible  Please be sure to bring all of your medications with you to every visit.  Should you have any new or worsening symptoms, please be sure to call the clinic at [phone].

## 2013-10-13 NOTE — Progress Notes (Signed)
Subjective:   Patient ID: Alisha [name] female   DOB: 02-11-1961 52 y.o.   MRN: [id number]   HPI: [name] is a 52 y.o. woman with past medical history significant for peripheral vascular disease, history of DVTs, restless leg syndrome, chronic pain syndrome, DJD, depression, and anxiety.   Since my last visit wit her, she has moved into a new house that she is happy with.  Today she is here regarding chronic pain & HTN.  To review, Pain is located from the hips all the way down into feet.  She gets feet cramps, gripping, burning, achy sensation. Sensation is throbbing. Has CNA 7d/week. Pain started after peripheral bypass in 2009.  Pain to the point of feeling like she will drop. Walks around home, but also has wheelchair, walker and cane. Also furniture walks. No recent falls. She does have low back pain, that feels tight. No sharp, shooting pain down leg. Numbness on lateral parts of left leg, none on right leg. Constant pain.  Pain is worse with walking, standing, laying. Feels like she has restless leg syndrome. Pain improved with warmth. No help with tramadol.   Continues to smoke   Review of Systems: Constitutional: Denies fever, chills, diaphoresis, appetite change   HEENT: Denies photophobia, eye pain, redness, hearing loss, ear pain, congestion, sore throat, rhinorrhea, sneezing, mouth sores, trouble swallowing, neck pain, neck stiffness and tinnitus.  Respiratory: Denies SOB, DOE, cough, chest tightness, and wheezing.  Cardiovascular: Denies chest pain, palpitations and leg swelling.  Gastrointestinal: Denies nausea, vomiting, abdominal pain, diarrhea, constipation,blood in stool and abdominal distention.  Genitourinary: Denies dysuria, urgency, frequency, hematuria, flank pain and difficulty urinating.  Skin: Denies pallor, rash and wound.  Neurological: Denies dizziness, seizures, syncope, weakness, lightheadedness, numbness and headaches.     Past Medical History    Diagnosis Date  . DVT (deep venous thrombosis)     bilateral, 2 episodes: Requires lifelong therapy  . Chronic pain syndrome   . Restless leg syndrome   . Depression   . Anxiety   . Hyperlipidemia   . Hypertension   . PVD (peripheral vascular disease)     s/p L fem-pop bypass 11/21/08; graft occluded 12/28/08;  aortogram w/ bilat LE runoff: 1.  bilat diffuse SFA occlusive dz, 2.  Mod to severe above-knee popliteal dz,  3.  Bilat 3-vessel runoff w/ mild tibial occlusive dz.  . Tobacco abuse   . Degenerative joint disease of knee   . Breast lump 02/2008    Biopsy 05/2008: showed no evidence of malignancy  . Irregular menses 9/08  . Pulmonary edema   . Anxiety   . Joint pain   . Mixed stress and urge urinary incontinence     [facility], underwent cystoscopy & uroflowmetry   . Endometrial mass 10/12/2012    Endometrium, biopsy on 11/04/12 - PROLIFERATIVE ENDOMETRIUM AND ABUNDANT MUCUS. NO HYPERPLASIA OR CARCINOMA.   . Sigmoid diverticulitis 10/26/2012   Current Outpatient Prescriptions  Medication Sig Dispense Refill  . albuterol (PROAIR HFA) 108 (90 BASE) MCG/ACT inhaler Inhale 2 puffs into the lungs every 6 (six) hours as needed for wheezing. Take 1-2 puffs as needed every 4-6 hours as needed for shortness of breath  1 Inhaler  3  . amLODipine (NORVASC) 10 MG tablet Take 1 tablet (10 mg total) by mouth daily.  30 tablet  11  . aspirin 81 MG tablet Take 1 tablet (81 mg total) by mouth daily.  100 tablet  3  . atorvastatin (LIPITOR) 40 MG tablet Take 1 tablet (40 mg total) by mouth at bedtime.  90 tablet  3  . buPROPion (WELLBUTRIN XL) 300 MG 24 hr tablet Take 300 mg by mouth every morning.      . carvedilol (COREG) 25 MG tablet TAKE ONE TABLET BY MOUTH TWICE DAILY  180 tablet  0  . diazepam (VALIUM) 5 MG tablet Take 10 mg by mouth every 8 (eight) hours as needed.       . DULoxetine (CYMBALTA) 60 MG capsule Take 60 mg by mouth. Take 1 capsule in the morning and 1  capsule at bedtime       . gabapentin (NEURONTIN) 800 MG tablet TAKE ONE TABLET BY MOUTH IN THE MORNING--1 AT MIDDAY--& 2 AT NIGHT  120 tablet  11  . hydrALAZINE (APRESOLINE) 10 MG tablet Take 1 tablet (10 mg total) by mouth 3 (three) times daily.  90 tablet  3  . hydrochlorothiazide (HYDRODIURIL) 25 MG tablet TAKE ONE TABLET BY MOUTH ONE TIME DAILY  30 tablet  1  . lisinopril (PRINIVIL,ZESTRIL) 40 MG tablet Take 1 tablet (40 mg total) by mouth daily.  30 tablet  6  . traMADol (ULTRAM) 50 MG tablet Take 2 tablets (100 mg total) by mouth every 6 (six) hours as needed for pain.  100 tablet  0  . traZODone (DESYREL) 100 MG tablet Take 100-200 mg by mouth at bedtime.      Marland Kitchen warfarin (COUMADIN) 7.5 MG tablet Take 1& 1/2 tablets Monday through Friday; 1 tablet only on Saturdays & Sundays.  50 tablet  2  . ziprasidone (GEODON) 60 MG capsule Take 60 mg by mouth 2 (two) times daily with a meal.        No current facility-administered medications for this visit.   Family History  Problem Relation Age of Onset  . Cancer Mother     breast   History   Social History  . Marital Status: Divorced    Spouse Name: N/A    Number of Children: N/A  . Years of Education: N/A   Social History Main Topics  . Smoking status: Current Every Day Smoker -- 0.25 packs/day for 18 years    Types: Cigarettes  . Smokeless tobacco: Never Used  . Alcohol Use: No  . Drug Use: No  . Sexual Activity: No   Other Topics Concern  . Not on file   Social History Narrative   Financial assistance application initiated. Patient needs to submit further paperwork to complete   [facility]  September 11, 2010 2:04 PM   Financial assistance approved for 100% discount at [facility] and has [facility] card   Rudell Cobb  October 04, 2010 5:29 PM    Objective:  Physical Exam: Filed Vitals:   10/13/13 1427  BP: 191/118  Pulse: 76  Temp: 98.1 F (36.7 C)  TempSrc: Oral  SpO2: 98%   General: emotionally labile HEENT: PERRL,  EOMI, no scleral icterus Cardiac: RRR, no rubs, murmurs or gallops Pulm: clear to auscultation bilaterally, moving normal volumes of air Abd: soft, nontender, nondistended, BS normoactive Ext: warm and well perfused, no pedal edema, decreased hair growth Neuro: alert and oriented X3  Assessment & Plan:  Case and care discussed with Dr. Criselda Peaches.  Please see problem oriented charting for further details. Patient to return in 1 week for HTN follow up.

## 2013-10-14 ENCOUNTER — Encounter: Payer: Self-pay | Admitting: Internal Medicine

## 2013-10-14 LAB — PRESCRIPTION ABUSE MONITORING 15P, URINE
Barbiturate Screen, Urine: NEGATIVE ng/mL
Buprenorphine, Urine: NEGATIVE ng/mL
Cannabinoid Scrn, Ur: NEGATIVE ng/mL
Creatinine, Urine: 27.47 mg/dL (ref >20.0)
Meperidine, Ur: NEGATIVE ng/mL
Methadone Screen, Urine: NEGATIVE ng/mL
Opiate Screen, Urine: NEGATIVE ng/mL
Tramadol Scrn, Ur: NEGATIVE ng/mL
Zolpidem, Urine: NEGATIVE ng/mL

## 2013-10-14 LAB — ANA: Anti Nuclear Antibody(ANA): NEGATIVE

## 2013-10-14 NOTE — Progress Notes (Signed)
Case discussed with Dr. Sharda at the time of the visit.  We reviewed the resident's history and exam and pertinent patient test results.  I agree with the assessment, diagnosis, and plan of care documented in the resident's note.    

## 2013-10-14 NOTE — Assessment & Plan Note (Signed)
Blood pressure significantly elevated today.  Patient promises that she is taking her medications, and that pressures are elevated secondary to pain, but below is the review from pharmacy: Amlodipine last filled 04/01/13 #30 Carvedilol last filled 06/20/12, #180 Hydralazine last filled 09/16/13 #90 HCTZ last filled 07/11/13 #30 Lisinopril 09/01/13, #30  Prior to confronting patient, I confirmed pharmacy and that there were no financial issues.  Upon my informing patient of pharamacy's records, she said that she was cutting tablets to help spread them out because of how the meds make her feel (lethargic, sleepy), subsequently she said she couldn't fill BP meds now due to finances from moving into a new house.    I made a deal with her that she would need to fill the lisinopril-Hctz combo pill and I would give her 2 weeks of tramadol that could only be filled with the BP med, as her risk of stroke is high with severely uncontrolled BP.    I also rechecked ANA (negative) and UDS (now resulted, + for cocaine).

## 2013-10-14 NOTE — Addendum Note (Signed)
Addended by: Debe Coder B on: 10/14/2013 03:14 PM   Modules accepted: Level of Service

## 2013-10-14 NOTE — Assessment & Plan Note (Signed)
Please see medication inconsistencies under HTN.  I did prescribe 2 more weeks of ultram pending pain clinic approval, but in the interim, a UDS has returned + for cocaine.  [facility] will need to warn patient - will discuss further action with clinic directors.

## 2013-10-18 LAB — BENZODIAZEPINES (GC/LC/MS), URINE
Alprazolam (GC/LC/MS), ur confirm: NEGATIVE ng/mL
Alprazolam metabolite (GC/LC/MS), ur confirm: NEGATIVE ng/mL
Clonazepam metabolite (GC/LC/MS), ur confirm: NEGATIVE ng/mL
Estazolam (GC/LC/MS), ur confirm: NEGATIVE ng/mL
Flunitrazepam metabolite (GC/LC/MS), ur confirm: NEGATIVE ng/mL
Flurazepam metabolite (GC/LC/MS), ur confirm: NEGATIVE ng/mL
Lorazepam (GC/LC/MS), ur confirm: NEGATIVE ng/mL
Temazepam (GC/LC/MS), ur confirm: 231 ng/mL

## 2013-10-18 LAB — COCAINE METABOLITE (GC/LC/MS), URINE: Benzoylecgonine GC/MS Conf: 315 ng/mL — ABNORMAL HIGH

## 2013-10-20 ENCOUNTER — Ambulatory Visit: Payer: Medicare Other | Admitting: Internal Medicine

## 2013-10-20 ENCOUNTER — Telehealth: Payer: Self-pay | Admitting: Internal Medicine

## 2013-10-20 ENCOUNTER — Other Ambulatory Visit: Payer: Self-pay | Admitting: Internal Medicine

## 2013-10-20 ENCOUNTER — Encounter: Payer: Self-pay | Admitting: Internal Medicine

## 2013-10-20 DIAGNOSIS — G894 Chronic pain syndrome: Secondary | ICD-10-CM

## 2013-10-20 DIAGNOSIS — N63 Unspecified lump in unspecified breast: Secondary | ICD-10-CM

## 2013-10-20 DIAGNOSIS — N644 Mastodynia: Secondary | ICD-10-CM

## 2013-10-20 MED ORDER — SULINDAC 150 MG PO TABS: 150.0000 mg | ORAL_TABLET | Freq: Two times a day (BID) | ORAL | Status: DC

## 2013-10-20 NOTE — Telephone Encounter (Signed)
I called patient to find out why she missed this AM appointment - she said her aid was late and could not bring her. I notified her of her UDS being cocaine + last week, and that was contributing to her elevated blood pressures.  I also informed her that [facility] will not be able to prescribe controlled substances to her given her cocaine + urine, and this includes tramadol, which has recently become controlled. She would like to retry sulindac 150mg  BID for her pain, I will send this is.

## 2013-10-22 ENCOUNTER — Encounter: Payer: Self-pay | Admitting: Internal Medicine

## 2013-10-22 ENCOUNTER — Ambulatory Visit (INDEPENDENT_AMBULATORY_CARE_PROVIDER_SITE_OTHER): Payer: Medicare Other | Admitting: Internal Medicine

## 2013-10-22 VITALS — BP 176/103 | HR 81 | Temp 97.4°F | Ht 64.0 in | Wt 183.0 lb

## 2013-10-22 DIAGNOSIS — I10 Essential (primary) hypertension: Secondary | ICD-10-CM

## 2013-10-22 MED ORDER — HYDRALAZINE HCL 25 MG PO TABS: 25.0000 mg | ORAL_TABLET | Freq: Three times a day (TID) | ORAL | Status: DC

## 2013-10-22 NOTE — Progress Notes (Signed)
HPI The patient is a 52 y.o. female with a history of HTN, tobacco abuse, presenting for a BP recheck.  The patient presents for a BP recheck.  The patient reports taking lisinopril-HCTZ 40 daily, coreg 25 BID, hydralazine 10 mg TID, taking all of these about 30 minutes ago today.  The patient notes not taking Norvasc, due to not being able to pick it up from the pharmacy.  The patient also notes drinking a cup of coffee this morning, and states that she believes cocaine withdrawal may be playing a role in her high BP as well.  Please see last office visit from 10/15, noting medication non-compliance.  I called the patient's pharmacy, which notes the following most recent fills: Amlodipine 4/3 (ready now) Coreg 6/22 (90 day supply) (ready now) Lisinopril-HCTZ 10/15 Hydralazine 9/19  ROS: General: no fevers, chills, changes in weight, changes in appetite Skin: no rash HEENT: no blurry vision, hearing changes, sore throat Pulm: no dyspnea, coughing, wheezing CV: no chest pain, palpitations, shortness of breath Abd: no abdominal pain, nausea/vomiting, diarrhea/constipation GU: no dysuria, hematuria, polyuria Ext: no arthralgias, myalgias Neuro: no weakness, numbness, or tingling  Filed Vitals:   10/22/13 0942  BP: 176/103  Pulse: 81  Temp: 97.4 F (36.3 C)    PEX General: alert, cooperative, and in no apparent distress HEENT: pupils equal round and reactive to light, vision grossly intact, oropharynx clear and non-erythematous  Neck: supple Lungs: clear to ascultation bilaterally, normal work of respiration, no wheezes, rales, ronchi Heart: regular rate and rhythm, no murmurs, gallops, or rubs Abdomen: soft, non-tender, non-distended, normal bowel sounds Extremities: no cyanosis, clubbing, or edema Neurologic: alert & oriented X3, cranial nerves II-XII intact, strength grossly intact, sensation intact to light touch  Current Outpatient Prescriptions on File Prior to Visit   Medication Sig Dispense Refill  . albuterol (PROAIR HFA) 108 (90 BASE) MCG/ACT inhaler Inhale 2 puffs into the lungs every 6 (six) hours as needed for wheezing. Take 1-2 puffs as needed every 4-6 hours as needed for shortness of breath  1 Inhaler  3  . amLODipine (NORVASC) 10 MG tablet Take 1 tablet (10 mg total) by mouth daily.  30 tablet  11  . aspirin 81 MG tablet Take 1 tablet (81 mg total) by mouth daily.  100 tablet  3  . atorvastatin (LIPITOR) 40 MG tablet Take 1 tablet (40 mg total) by mouth at bedtime.  90 tablet  3  . buPROPion (WELLBUTRIN XL) 300 MG 24 hr tablet Take 300 mg by mouth every morning.      . carvedilol (COREG) 25 MG tablet TAKE ONE TABLET BY MOUTH TWICE DAILY  180 tablet  0  . diazepam (VALIUM) 5 MG tablet Take 10 mg by mouth every 8 (eight) hours as needed.       . DULoxetine (CYMBALTA) 60 MG capsule Take 60 mg by mouth. Take 1 capsule in the morning and 1 capsule at bedtime       . gabapentin (NEURONTIN) 800 MG tablet TAKE ONE TABLET BY MOUTH IN THE MORNING--1 AT MIDDAY--& 2 AT NIGHT  120 tablet  11  . hydrALAZINE (APRESOLINE) 10 MG tablet Take 1 tablet (10 mg total) by mouth 3 (three) times daily.  90 tablet  3  . lisinopril (PRINIVIL,ZESTRIL) 40 MG tablet Take 1 tablet (40 mg total) by mouth daily.  30 tablet  6  . lisinopril-hydrochlorothiazide (PRINZIDE,ZESTORETIC) 20-12.5 MG per tablet Take 2 tablets by mouth daily.  60 tablet  5  .  sulindac (CLINORIL) 150 MG tablet Take 1 tablet (150 mg total) by mouth 2 (two) times daily. With food  60 tablet  0  . traMADol (ULTRAM) 50 MG tablet Take 2 tablets (100 mg total) by mouth every 6 (six) hours as needed for pain.  112 tablet  0  . traZODone (DESYREL) 100 MG tablet Take 100-200 mg by mouth at bedtime.      Marland Kitchen warfarin (COUMADIN) 7.5 MG tablet Take 1& 1/2 tablets Monday through Friday; 1 tablet only on Saturdays & Sundays.  50 tablet  2  . ziprasidone (GEODON) 60 MG capsule Take 60 mg by mouth 2 (two) times daily with a  meal.        No current facility-administered medications on file prior to visit.    Assessment/Plan

## 2013-10-22 NOTE — Patient Instructions (Signed)
General Instructions: Your blood pressure is better today, though it is still elevated.  Continue to take your Amlodipine, lisinopril-HCTZ, and Carvedilol. -we are increasing the dose of your hydralazine today to 25 mg three times per day  Please return for a follow-up visit in 2-3 weeks for a blood pressure recheck.   Treatment Goals:  Goals (1 Years of Data) as of 10/22/13         As of Today 10/13/13 09/16/13 09/16/13 09/02/13     Blood Pressure    . Blood Pressure < 140/90  176/103 191/118 174/97 177/109 151/95      Progress Toward Treatment Goals:  Treatment Goal 10/22/2013  Blood pressure improved  Stop smoking -  Prevent falls -    Self Care Goals & Plans:  Self Care Goal 10/22/2013  Manage my medications take my medicines as prescribed; refill my medications on time; bring my medications to every visit  Eat healthy foods eat baked foods instead of fried foods; eat foods that are low in salt  Be physically active find an activity I enjoy    No flowsheet data found.   Care Management & Community Referrals:  Referral 10/22/2013  Referrals made for care management support none needed

## 2013-10-22 NOTE — Assessment & Plan Note (Signed)
BP Readings from Last 3 Encounters:  10/22/13 176/103  10/13/13 191/118  09/16/13 174/97    Lab Results  Component Value Date   NA 141 07/16/2013   K 4.4 07/16/2013   CREATININE 0.83 07/16/2013    Assessment: Blood pressure control: moderately elevated Progress toward BP goal:  improved Comments: The patient's blood pressure is still elevated today, but not as high as at her last visit.  I still question the patient's compliance, and she has not picked up her amlodipine, though she did recently pick up her lisinopril-HCTZ. Will continue to encourage medication compliance. We'll increase hydralazine to 25 mg 3 times a day.  Plan: Medications:  continue current medications, increase hydralazine to 25 mg TID Educational resources provided:   Self management tools provided:   Other plans: Follow-up for BP recheck in 2-3 weeks

## 2013-10-25 NOTE — Addendum Note (Signed)
Addended by: Debe Coder B on: 10/25/2013 11:30 AM   Modules accepted: Level of Service

## 2013-10-25 NOTE — Progress Notes (Signed)
Case discussed with Dr. Brown at the time of the visit.  We reviewed the resident's history and exam and pertinent patient test results.  I agree with the assessment, diagnosis, and plan of care documented in the resident's note. 

## 2013-11-01 ENCOUNTER — Ambulatory Visit (INDEPENDENT_AMBULATORY_CARE_PROVIDER_SITE_OTHER): Payer: Medicare Other | Admitting: Pharmacist

## 2013-11-01 DIAGNOSIS — Z7901 Long term (current) use of anticoagulants: Secondary | ICD-10-CM

## 2013-11-01 DIAGNOSIS — I82409 Acute embolism and thrombosis of unspecified deep veins of unspecified lower extremity: Secondary | ICD-10-CM

## 2013-11-01 MED ORDER — WARFARIN SODIUM 7.5 MG PO TABS: ORAL_TABLET | ORAL | Status: DC

## 2013-11-01 NOTE — Progress Notes (Signed)
Anti-Coagulation Progress Note  Alisha [name] is a 52 y.o. female who is currently on an anti-coagulation regimen.    RECENT RESULTS: Recent results are below, the most recent result is correlated with a dose of 71.25 mg. per week: Lab Results  Component Value Date   INR 3.10 11/01/2013   INR 2.10 10/11/2013   INR 1.60 09/27/2013    ANTI-COAG DOSE: Anticoagulation Dose Instructions as of 11/01/2013     [name] Tue Wed Thu Fri Sat   New Dose 11.25 mg 7.5 mg 11.25 mg 7.5 mg 11.25 mg 7.5 mg 11.25 mg       ANTICOAG SUMMARY: Anticoagulation Episode Summary   Current INR goal 2.0-3.0  Next INR check 11/22/2013  INR from last check 3.10! (11/01/2013)  Weekly max dose   Target end date   [facility]  Preferred lab   Send INR reminders to ANTICOAG IMP   Indications  DVT [453.40] Long term current use of anticoagulant [V58.61]        Comments       Anticoagulation Care Providers   Provider Role Specialty Phone number   Zoila Shutter, MD  Internal Medicine [phone]      ANTICOAG TODAY: Anticoagulation Summary as of 11/01/2013   INR goal 2.0-3.0  Selected INR 3.10! (11/01/2013)  Next INR check 11/22/2013  Target end date    Indications  DVT [453.40] Long term current use of anticoagulant [V58.61]      Anticoagulation Episode Summary   [facility] check [facility]   Preferred lab    Send INR reminders to ANTICOAG IMP   Comments     Anticoagulation Care Providers   Provider Role Specialty Phone number   Zoila Shutter, MD  Internal Medicine [phone]      PATIENT INSTRUCTIONS: Patient Instructions  Patient instructed to take medications as defined in the Anti-coagulation Track section of this encounter.  Patient instructed to take today's dose.  Patient verbalized understanding of these instructions.       FOLLOW-UP Return in 3 weeks (on 11/22/2013) for Follow up INR at 1130h.  Hulen Luster, III Pharm.D., CACP

## 2013-11-01 NOTE — Patient Instructions (Signed)
Patient instructed to take medications as defined in the Anti-coagulation Track section of this encounter.  Patient instructed to take today's dose.  Patient verbalized understanding of these instructions.    

## 2013-11-05 ENCOUNTER — Encounter: Payer: Self-pay | Admitting: Internal Medicine

## 2013-11-05 ENCOUNTER — Ambulatory Visit (INDEPENDENT_AMBULATORY_CARE_PROVIDER_SITE_OTHER): Payer: Medicare Other | Admitting: Internal Medicine

## 2013-11-05 VITALS — BP 122/78 | HR 71 | Temp 97.0°F | Ht 64.0 in | Wt 190.9 lb

## 2013-11-05 DIAGNOSIS — I739 Peripheral vascular disease, unspecified: Secondary | ICD-10-CM

## 2013-11-05 DIAGNOSIS — G894 Chronic pain syndrome: Secondary | ICD-10-CM

## 2013-11-05 DIAGNOSIS — E785 Hyperlipidemia, unspecified: Secondary | ICD-10-CM

## 2013-11-05 MED ORDER — ATORVASTATIN CALCIUM 40 MG PO TABS: 40.0000 mg | ORAL_TABLET | Freq: Every day | ORAL | Status: DC

## 2013-11-05 MED ORDER — ALBUTEROL SULFATE HFA 108 (90 BASE) MCG/ACT IN AERS: 2.0000 | INHALATION_SPRAY | Freq: Four times a day (QID) | RESPIRATORY_TRACT | Status: DC | PRN

## 2013-11-05 MED ORDER — TRAMADOL HCL 50 MG PO TABS: 100.0000 mg | ORAL_TABLET | Freq: Four times a day (QID) | ORAL | Status: DC | PRN

## 2013-11-05 NOTE — Assessment & Plan Note (Signed)
Patient has had several prescriptions for pain meds. She has a pain clinic appointment on the 14th of this month- Nov. Will give patient enough mediaxctin to last till she sees a pain speciallist. Emphasized the importance of this and told patyinet that no more refills will be given to her after this. Patient voiced understanding.

## 2013-11-05 NOTE — Patient Instructions (Signed)
Please be sure to keep your appointment with the pain clinic as we will not be prescribing any more tramadol. This is very important. Also your blood pressure is well controlled today. Please continue taking your blood pressure medications. Stopping smoking will reduce your risk of heart attack and strokes, it will also reduce further damage to the blood vessels in your legs.

## 2013-11-05 NOTE — Progress Notes (Signed)
Patient ID: Alisha [name], female   DOB: 16-Apr-1961, 52 y.o.   MRN: [id number]   Subjective:   Patient ID: Alisha [name] female   DOB: [birth year] 52 y.o.   MRN: [id number]  HPI: [name] is a 52 y.o. female with PMH of PVD, HTN, osteoarthritis, Tobacco abuse, restless leg syndrome, chronic pain syndrome, depression, HLD, DVT.  Patient presented today with c/o Leg and body pains. Patient describes the pain as burning from her thighs down to her lower legs, rates the pain as a 8/10, says they have always been there. No redness or colour change, no swelling present. She has had left popliteal bypass in 2009, but that she went back to her doctor and was told that the arteries in her second leg were occluded as well. Patient says she has run out of her tramadol which helps with her pain, and wants more, enough to cover her till she goes to the pain clinic. She has an appointment with a pain clinic- this month- Nov the 14th. Patient also complains of pain all over her body, says she back, arms, and whole body hurts.  Patient still smokes cigarettes- 2-3 packs a week. Patient claims compliance with all her medications.   Past Medical History  Diagnosis Date  . DVT (deep venous thrombosis)     bilateral, 2 episodes: Requires lifelong therapy  . Chronic pain syndrome   . Restless leg syndrome   . Depression   . Anxiety   . Hyperlipidemia   . Hypertension   . PVD (peripheral vascular disease)     s/p L fem-pop bypass 11/21/08; graft occluded 12/28/08;  aortogram w/ bilat LE runoff: 1.  bilat diffuse SFA occlusive dz, 2.  Mod to severe above-knee popliteal dz,  3.  Bilat 3-vessel runoff w/ mild tibial occlusive dz.  . Tobacco abuse   . Degenerative joint disease of knee   . Breast lump 02/2008    Biopsy 05/2008: showed no evidence of malignancy  . Irregular menses 9/08  . Pulmonary edema   . Anxiety   . Joint pain   . Mixed stress and urge urinary incontinence     [facility], underwent cystoscopy & uroflowmetry   . Endometrial mass 10/12/2012    Endometrium, biopsy on 11/04/12 - PROLIFERATIVE ENDOMETRIUM AND ABUNDANT MUCUS. NO HYPERPLASIA OR CARCINOMA.   . Sigmoid diverticulitis 10/26/2012   Current Outpatient Prescriptions  Medication Sig Dispense Refill  . albuterol (PROAIR HFA) 108 (90 BASE) MCG/ACT inhaler Inhale 2 puffs into the lungs every 6 (six) hours as needed for wheezing. Take 1-2 puffs as needed every 4-6 hours as needed for shortness of breath  1 Inhaler  3  . amLODipine (NORVASC) 10 MG tablet Take 1 tablet (10 mg total) by mouth daily.  30 tablet  11  . aspirin 81 MG tablet Take 1 tablet (81 mg total) by mouth daily.  100 tablet  3  . atorvastatin (LIPITOR) 40 MG tablet Take 1 tablet (40 mg total) by mouth at bedtime.  90 tablet  3  . buPROPion (WELLBUTRIN XL) 300 MG 24 hr tablet Take 300 mg by mouth every morning.      . carvedilol (COREG) 25 MG tablet TAKE ONE TABLET BY MOUTH TWICE DAILY  180 tablet  0  . diazepam (VALIUM) 5 MG tablet Take 10 mg by mouth every 8 (eight) hours as needed.       . DULoxetine (CYMBALTA) 60 MG capsule Take 60  mg by mouth. Take 1 capsule in the morning and 1 capsule at bedtime       . gabapentin (NEURONTIN) 800 MG tablet TAKE ONE TABLET BY MOUTH IN THE MORNING--1 AT MIDDAY--& 2 AT NIGHT  120 tablet  11  . hydrALAZINE (APRESOLINE) 25 MG tablet Take 1 tablet (25 mg total) by mouth 3 (three) times daily.  90 tablet  3  . lisinopril (PRINIVIL,ZESTRIL) 40 MG tablet Take 1 tablet (40 mg total) by mouth daily.  30 tablet  6  . lisinopril-hydrochlorothiazide (PRINZIDE,ZESTORETIC) 20-12.5 MG per tablet Take 2 tablets by mouth daily.  60 tablet  5  . sulindac (CLINORIL) 150 MG tablet Take 1 tablet (150 mg total) by mouth 2 (two) times daily. With food  60 tablet  0  . traMADol (ULTRAM) 50 MG tablet Take 2 tablets (100 mg total) by mouth every 6 (six) hours as needed.  64 tablet  0  . traZODone (DESYREL) 100 MG tablet  Take 100-200 mg by mouth at bedtime.      Marland Kitchen warfarin (COUMADIN) 7.5 MG tablet Take 1& 1/2 tablets on Sundays/Tuesdays/Thursdays/Saturdays; Take 1 tablet all other days.  50 tablet  3  . ziprasidone (GEODON) 60 MG capsule Take 60 mg by mouth 2 (two) times daily with a meal.        No current facility-administered medications for this visit.   Family History  Problem Relation Age of Onset  . Cancer Mother     breast   History   Social History  . Marital Status: Divorced    Spouse Name: N/A    Number of Children: N/A  . Years of Education: N/A   Social History Main Topics  . Smoking status: Current Every Day Smoker -- 0.25 packs/day for 18 years    Types: Cigarettes  . Smokeless tobacco: Never Used  . Alcohol Use: No  . Drug Use: No  . Sexual Activity: No   Other Topics Concern  . None   Social History Narrative   Software engineer initiated. Patient needs to submit further paperwork to complete   [facility]  September 11, 2010 2:04 PM   Financial assistance approved for 100% discount at [facility] and has [facility] card   Rudell Cobb  October 04, 2010 5:29 PM   Review of Systems: No pertinent findings on ROS.  Objective:  Physical Exam: Filed Vitals:   11/05/13 0939  BP: 122/78  Pulse: 71  Temp: 97 F (36.1 C)  TempSrc: Oral  Height: 5\' 4"  (1.626 m)  Weight: 190 lb 14.4 oz (86.592 kg)  SpO2: 100%   GENERAL- alert, co-operative, appears as stated age, not in any distress. HEENT- Atraumatic, normocephalic, PERRL, EOMI, oral mucosa appears moist. CARDIAC- RRR, no murmurs, rubs or gallops. RESP- Moving equal volumes of air, and clear to auscultation bilaterally. ABDOMEN- Soft,non tender, bowel sounds present. NEURO- No obvious Cr N abnormality.  EXTREMITIES- Dorsalis pedis and popliteal artery pulses not palpable, in both feet. SKIN- Warm, dry, No rash or lesion. PSYCH- Normal mood and affect, appropriate thought content and speech.  Assessment & Plan:     The patient's case and plan of care was discussed with attending physician, Dr. Sharlene Motts.  Please see problem based chatting for Assessment and plan.

## 2013-11-08 NOTE — Progress Notes (Signed)
I saw and evaluated the patient.  I personally confirmed the key portions of the history and exam documented by Dr. Emokpae and I reviewed pertinent patient test results.  The assessment, diagnosis, and plan were formulated together and I agree with the documentation in the resident's note. 

## 2013-11-08 NOTE — Addendum Note (Signed)
Addended by: Debe Coder B on: 11/08/2013 04:29 PM   Modules accepted: Level of Service

## 2013-11-12 DIAGNOSIS — F333 Major depressive disorder, recurrent, severe with psychotic symptoms: Secondary | ICD-10-CM | POA: Diagnosis not present

## 2013-11-19 ENCOUNTER — Other Ambulatory Visit: Payer: Medicare Other

## 2013-11-22 ENCOUNTER — Ambulatory Visit: Payer: Medicare Other

## 2013-11-23 ENCOUNTER — Ambulatory Visit (INDEPENDENT_AMBULATORY_CARE_PROVIDER_SITE_OTHER): Payer: Medicare Other | Admitting: Pharmacist

## 2013-11-23 DIAGNOSIS — Z7901 Long term (current) use of anticoagulants: Secondary | ICD-10-CM | POA: Diagnosis not present

## 2013-11-23 DIAGNOSIS — I82409 Acute embolism and thrombosis of unspecified deep veins of unspecified lower extremity: Secondary | ICD-10-CM | POA: Diagnosis not present

## 2013-11-23 LAB — POCT INR: INR: 2.9

## 2013-11-23 NOTE — Progress Notes (Signed)
Anti-Coagulation Progress Note  Alisha [name] is a 52 y.o. female who is currently on an anti-coagulation regimen.    RECENT RESULTS: Recent results are below, the most recent result is correlated with a dose of 67.5 mg. per week: Lab Results  Component Value Date   INR 2.90 11/23/2013   INR 3.10 11/01/2013   INR 2.10 10/11/2013    ANTI-COAG DOSE: Anticoagulation Dose Instructions as of 11/23/2013     [name] Tue Wed Thu Fri Sat   New Dose 11.25 mg 7.5 mg 11.25 mg 7.5 mg 11.25 mg 7.5 mg 11.25 mg       ANTICOAG SUMMARY: Anticoagulation Episode Summary   Current INR goal 2.0-3.0  Next INR check 12/13/2013  INR from last check 2.90 (11/23/2013)  Weekly max dose   Target end date   [facility]  Preferred lab   Send INR reminders to ANTICOAG IMP   Indications  DVT [453.40] Long term current use of anticoagulant [V58.61]        Comments       Anticoagulation Care Providers   Provider Role Specialty Phone number   Zoila Shutter, MD  Internal Medicine [phone]      ANTICOAG TODAY: Anticoagulation Summary as of 11/23/2013   INR goal 2.0-3.0  Selected INR 2.90 (11/23/2013)  Next INR check 12/13/2013  Target end date    Indications  DVT [453.40] Long term current use of anticoagulant [V58.61]      Anticoagulation Episode Summary   [facility] check [facility]   Preferred lab    Send INR reminders to ANTICOAG IMP   Comments     Anticoagulation Care Providers   Provider Role Specialty Phone number   Zoila Shutter, MD  Internal Medicine [phone]      PATIENT INSTRUCTIONS: Patient Instructions  Patient instructed to take medications as defined in the Anti-coagulation Track section of this encounter.  Patient instructed to take today's dose.  Patient verbalized understanding of these instructions.       FOLLOW-UP Return in about 3 weeks (around 12/13/2013) for Follow up INR at 1115h.  Hulen Luster, III Pharm.D.,  CACP

## 2013-11-23 NOTE — Patient Instructions (Signed)
Patient instructed to take medications as defined in the Anti-coagulation Track section of this encounter.  Patient instructed to take today's dose.  Patient verbalized understanding of these instructions.    

## 2013-11-25 ENCOUNTER — Encounter (HOSPITAL_COMMUNITY): Payer: Self-pay | Admitting: Emergency Medicine

## 2013-11-25 ENCOUNTER — Emergency Department (HOSPITAL_COMMUNITY)
Admission: EM | Admit: 2013-11-25 | Discharge: 2013-11-25 | Disposition: A | Discharge status: Home / Self Care | Payer: Medicare Other | Attending: Emergency Medicine | Admitting: Emergency Medicine

## 2013-11-25 DIAGNOSIS — Z7982 Long term (current) use of aspirin: Secondary | ICD-10-CM | POA: Diagnosis not present

## 2013-11-25 DIAGNOSIS — I10 Essential (primary) hypertension: Secondary | ICD-10-CM | POA: Diagnosis not present

## 2013-11-25 DIAGNOSIS — Z8709 Personal history of other diseases of the respiratory system: Secondary | ICD-10-CM | POA: Diagnosis not present

## 2013-11-25 DIAGNOSIS — M1710 Unilateral primary osteoarthritis, unspecified knee: Secondary | ICD-10-CM | POA: Insufficient documentation

## 2013-11-25 DIAGNOSIS — Z7901 Long term (current) use of anticoagulants: Secondary | ICD-10-CM | POA: Diagnosis not present

## 2013-11-25 DIAGNOSIS — G2581 Restless legs syndrome: Secondary | ICD-10-CM | POA: Insufficient documentation

## 2013-11-25 DIAGNOSIS — Z8742 Personal history of other diseases of the female genital tract: Secondary | ICD-10-CM | POA: Insufficient documentation

## 2013-11-25 DIAGNOSIS — F329 Major depressive disorder, single episode, unspecified: Secondary | ICD-10-CM | POA: Insufficient documentation

## 2013-11-25 DIAGNOSIS — Z8719 Personal history of other diseases of the digestive system: Secondary | ICD-10-CM | POA: Insufficient documentation

## 2013-11-25 DIAGNOSIS — E785 Hyperlipidemia, unspecified: Secondary | ICD-10-CM | POA: Insufficient documentation

## 2013-11-25 DIAGNOSIS — Z86718 Personal history of other venous thrombosis and embolism: Secondary | ICD-10-CM | POA: Insufficient documentation

## 2013-11-25 DIAGNOSIS — F3289 Other specified depressive episodes: Secondary | ICD-10-CM | POA: Insufficient documentation

## 2013-11-25 DIAGNOSIS — IMO0002 Reserved for concepts with insufficient information to code with codable children: Secondary | ICD-10-CM | POA: Diagnosis not present

## 2013-11-25 DIAGNOSIS — N644 Mastodynia: Secondary | ICD-10-CM

## 2013-11-25 DIAGNOSIS — N63 Unspecified lump in unspecified breast: Secondary | ICD-10-CM | POA: Insufficient documentation

## 2013-11-25 DIAGNOSIS — Z79899 Other long term (current) drug therapy: Secondary | ICD-10-CM | POA: Diagnosis not present

## 2013-11-25 DIAGNOSIS — Z87448 Personal history of other diseases of urinary system: Secondary | ICD-10-CM | POA: Insufficient documentation

## 2013-11-25 DIAGNOSIS — F17200 Nicotine dependence, unspecified, uncomplicated: Secondary | ICD-10-CM | POA: Diagnosis not present

## 2013-11-25 DIAGNOSIS — F411 Generalized anxiety disorder: Secondary | ICD-10-CM | POA: Diagnosis not present

## 2013-11-25 DIAGNOSIS — Z9889 Other specified postprocedural states: Secondary | ICD-10-CM | POA: Insufficient documentation

## 2013-11-25 DIAGNOSIS — G894 Chronic pain syndrome: Secondary | ICD-10-CM | POA: Diagnosis not present

## 2013-11-25 MED ORDER — OXYCODONE-ACETAMINOPHEN 5-325 MG PO TABS
1.0000 | ORAL_TABLET | Freq: Once | ORAL | Status: AC
  Administered 2013-11-25: 1 via ORAL
  Filled 2013-11-25: qty 1

## 2013-11-25 NOTE — ED Notes (Signed)
Pt. reports bilateral breast pain for several weeks worse these past several days , denies injury , no fever or chills.

## 2013-11-25 NOTE — ED Notes (Signed)
Pt. C/o bil. Breast pain. She states that she recently was having a mammogram and they stopped and told her she would need to have further test because of something they saw on the left breast. She has a Hx. lumpectomy

## 2013-11-25 NOTE — ED Provider Notes (Signed)
CSN: [id number]     Arrival date & time 11/25/13  0315 History   First MD Initiated Contact with Patient 11/25/13 0542     Chief Complaint  Patient presents with  . Breast Pain   (Consider location/radiation/quality/duration/timing/severity/associated sxs/prior Treatment) HPI Comments: 52 yo female with bilateral painful breast masses.  Pain has been present for several weeks, is severe in intensity, constant, and worsening.  No nipple discharge or bleeding.  She endorses low grade fevers.  [facility]    Past Medical History  Diagnosis Date  . DVT (deep venous thrombosis)     bilateral, 2 episodes: Requires lifelong therapy  . Chronic pain syndrome   . Restless leg syndrome   . Depression   . Anxiety   . Hyperlipidemia   . Hypertension   . PVD (peripheral vascular disease)     s/p L fem-pop bypass 11/21/08; graft occluded 12/28/08;  aortogram w/ bilat LE runoff: 1.  bilat diffuse SFA occlusive dz, 2.  Mod to severe above-knee popliteal dz,  3.  Bilat 3-vessel runoff w/ mild tibial occlusive dz.  . Tobacco abuse   . Degenerative joint disease of knee   . Breast lump 02/2008    Biopsy 05/2008: showed no evidence of malignancy  . Irregular menses 9/08  . Pulmonary edema   . Anxiety   . Joint pain   . Mixed stress and urge urinary incontinence     [facility], underwent cystoscopy & uroflowmetry   . Endometrial mass 10/12/2012    Endometrium, biopsy on 11/04/12 - PROLIFERATIVE ENDOMETRIUM AND ABUNDANT MUCUS. NO HYPERPLASIA OR CARCINOMA.   . Sigmoid diverticulitis 10/26/2012   Past Surgical History  Procedure Laterality Date  . Femoral bypass  11/09  .  right external iliac artery stent    . Right breast needle-localized lumpectomy    . Pr vein bypass graft,aorto-fem-pop    . Carotid endarterectomy     Family History  Problem Relation Age of Onset  . Cancer Mother     breast   History   Substance Use Topics  . Smoking status: Current Every Day Smoker -- 0.25 packs/day for 18 years    Types: Cigarettes  . Smokeless tobacco: Never Used  . Alcohol Use: No   OB History   Grav Para Term Preterm Abortions TAB SAB Ect Mult Living   6 4 4  2 1 1   4      Review of Systems  Constitutional: Negative for fever.  HENT: Negative for congestion.   Respiratory: Negative for cough and shortness of breath.   Cardiovascular: Negative for chest pain.  Gastrointestinal: Negative for nausea, vomiting, abdominal pain and diarrhea.  All other systems reviewed and are negative.    Allergies  Propoxyphene-acetaminophen  Home Medications   Current Outpatient Rx  Name  Route  Sig  Dispense  Refill  . albuterol (PROAIR HFA) 108 (90 BASE) MCG/ACT inhaler   Inhalation   Inhale 2 puffs into the lungs every 6 (six) hours as needed for wheezing. Take 1-2 puffs as needed every 4-6 hours as needed for shortness of breath   1 Inhaler   3   . amLODipine (NORVASC) 10 MG tablet   Oral   Take 1 tablet (10 mg total) by mouth daily.   30 tablet   11   . aspirin 81 MG tablet   Oral   Take 1 tablet (81 mg total)  by mouth daily.   100 tablet   3   . atorvastatin (LIPITOR) 40 MG tablet   Oral   Take 1 tablet (40 mg total) by mouth at bedtime.   90 tablet   3   . buPROPion (WELLBUTRIN XL) 150 MG 24 hr tablet   Oral   Take 150 mg by mouth daily.         . carvedilol (COREG) 25 MG tablet      TAKE ONE TABLET BY MOUTH TWICE DAILY   180 tablet   0   . diazepam (VALIUM) 5 MG tablet   Oral   Take 10 mg by mouth every 8 (eight) hours as needed.          . DULoxetine (CYMBALTA) 60 MG capsule   Oral   Take 60 mg by mouth. Take 1 capsule in the morning and 1 capsule at bedtime          . gabapentin (NEURONTIN) 800 MG tablet      TAKE ONE TABLET BY MOUTH IN THE MORNING--1 AT MIDDAY--& 2 AT NIGHT   120 tablet   11   . hydrALAZINE (APRESOLINE) 25 MG tablet   Oral   Take 1  tablet (25 mg total) by mouth 3 (three) times daily.   90 tablet   3   . hydrOXYzine (ATARAX/VISTARIL) 25 MG tablet   Oral   Take 25 mg by mouth 3 (three) times daily as needed.         Marland Kitchen lisinopril-hydrochlorothiazide (PRINZIDE,ZESTORETIC) 20-12.5 MG per tablet   Oral   Take 2 tablets by mouth daily.   60 tablet   5   . sulindac (CLINORIL) 150 MG tablet   Oral   Take 1 tablet (150 mg total) by mouth 2 (two) times daily. With food   60 tablet   0   . traMADol (ULTRAM) 50 MG tablet   Oral   Take 2 tablets (100 mg total) by mouth every 6 (six) hours as needed.   64 tablet   0     Only fill with lisinopril-hctz   . traZODone (DESYREL) 100 MG tablet   Oral   Take 100-200 mg by mouth at bedtime.         Marland Kitchen warfarin (COUMADIN) 7.5 MG tablet      Take 1& 1/2 tablets on Sundays/Tuesdays/Thursdays/Saturdays; Take 1 tablet all other days.   50 tablet   3   . ziprasidone (GEODON) 60 MG capsule   Oral   Take 60 mg by mouth 2 (two) times daily with a meal.          . EXPIRED: lisinopril (PRINIVIL,ZESTRIL) 40 MG tablet   Oral   Take 1 tablet (40 mg total) by mouth daily.   30 tablet   6    BP 127/84  Pulse 76  Temp(Src) 97.7 F (36.5 C) (Oral)  SpO2 90% Physical Exam  Nursing note and vitals reviewed. Constitutional: She is oriented to person, place, and time. She appears well-developed and well-nourished. No distress.  HENT:  Head: Normocephalic and atraumatic.  Mouth/Throat: Oropharynx is clear and moist.  Eyes: Conjunctivae are normal. Pupils are equal, round, and reactive to light. No scleral icterus.  Neck: Neck supple.  Cardiovascular: Normal rate, regular rhythm, normal heart sounds and intact distal pulses.   No murmur heard. Pulmonary/Chest: Effort normal and breath sounds normal. No stridor. No respiratory distress. She has no rales. Right breast exhibits mass, skin change (prior lumpectomy) and  tenderness. Right breast exhibits no nipple discharge.  Left breast exhibits mass and tenderness. Left breast exhibits no nipple discharge and no skin change.  Bilateral masses are centrally located, tender.  Firm, not fluctuant.  No erythema.    Abdominal: Soft. Bowel sounds are normal. She exhibits no distension. There is no tenderness.  Musculoskeletal: Normal range of motion.  Neurological: She is alert and oriented to person, place, and time.  Skin: Skin is warm and dry. No rash noted.  Psychiatric: She has a normal mood and affect. Her behavior is normal.    ED Course  Procedures (including critical care time) Labs Review Labs Reviewed - No data to display Imaging Review No results found.  EKG Interpretation   None       MDM   1. Breast pain    52 yo female presenting with worsening pain from bilateral breast masses.  [facility].  She has no discharge, bleeding, fluctuance.  Vital stable.  They do not appear consistent with bilateral breast abscesses.  Pain treated with Percocet in the ED. Review of her chart showed that she has been referred to the pain clinic for treatment of this and other chronic pains. She missed this appointment. I do not feel comfortable prescribing her narcotics from the emergency and she is advised to followup with her primary doctor and pain clinic.    Candyce Churn, MD 11/25/13 2022

## 2013-12-09 ENCOUNTER — Ambulatory Visit
Admission: RE | Admit: 2013-12-09 | Discharge: 2013-12-09 | Disposition: A | Discharge status: Home / Self Care | Payer: Medicare Other | Source: Ambulatory Visit | Attending: *Deleted | Admitting: *Deleted

## 2013-12-09 ENCOUNTER — Other Ambulatory Visit: Payer: Self-pay | Admitting: Internal Medicine

## 2013-12-09 DIAGNOSIS — N6019 Diffuse cystic mastopathy of unspecified breast: Secondary | ICD-10-CM | POA: Diagnosis not present

## 2013-12-09 DIAGNOSIS — N63 Unspecified lump in unspecified breast: Secondary | ICD-10-CM | POA: Diagnosis not present

## 2013-12-09 DIAGNOSIS — N644 Mastodynia: Secondary | ICD-10-CM

## 2013-12-13 ENCOUNTER — Ambulatory Visit (INDEPENDENT_AMBULATORY_CARE_PROVIDER_SITE_OTHER): Payer: Medicare Other | Admitting: Pharmacist

## 2013-12-13 DIAGNOSIS — I82409 Acute embolism and thrombosis of unspecified deep veins of unspecified lower extremity: Secondary | ICD-10-CM

## 2013-12-13 DIAGNOSIS — Z7901 Long term (current) use of anticoagulants: Secondary | ICD-10-CM | POA: Diagnosis not present

## 2013-12-13 MED ORDER — WARFARIN SODIUM 7.5 MG PO TABS: ORAL_TABLET | ORAL | Status: DC

## 2013-12-13 NOTE — Patient Instructions (Signed)
Patient instructed to take medications as defined in the Anti-coagulation Track section of this encounter.  Patient instructed to take today's dose.  Patient verbalized understanding of these instructions.    

## 2013-12-13 NOTE — Progress Notes (Signed)
Anti-Coagulation Progress Note  Alisha [name] is a 52 y.o. female who is currently on an anti-coagulation regimen.    RECENT RESULTS: Recent results are below, the most recent result is correlated with a dose of 67.5 mg. per week: Lab Results  Component Value Date   INR 2.00 12/13/2013   INR 2.90 11/23/2013   INR 3.10 11/01/2013    ANTI-COAG DOSE: Anticoagulation Dose Instructions as of 12/13/2013     [name] Tue Wed Thu Fri Sat   New Dose 11.25 mg 11.25 mg 11.25 mg 7.5 mg 11.25 mg 11.25 mg 11.25 mg       ANTICOAG SUMMARY: Anticoagulation Episode Summary   Current INR goal 2.0-3.0  Next INR check 12/27/2013  INR from last check 2.00 (12/13/2013)  Weekly max dose   Target end date   [facility]  Preferred lab   Send INR reminders to ANTICOAG IMP   Indications  DVT [453.40] Long term current use of anticoagulant [V58.61]        Comments       Anticoagulation Care Providers   Provider Role Specialty Phone number   Zoila Shutter, MD  Internal Medicine [phone]      ANTICOAG TODAY: Anticoagulation Summary as of 12/13/2013   INR goal 2.0-3.0  Selected INR 2.00 (12/13/2013)  Next INR check 12/27/2013  Target end date    Indications  DVT [453.40] Long term current use of anticoagulant [V58.61]      Anticoagulation Episode Summary   [facility]   Preferred lab    Send INR reminders to ANTICOAG IMP   Comments     Anticoagulation Care Providers   Provider Role Specialty Phone number   Zoila Shutter, MD  Internal Medicine [phone]      PATIENT INSTRUCTIONS: Patient Instructions  Patient instructed to take medications as defined in the Anti-coagulation Track section of this encounter.  Patient instructed to take today's dose.  Patient verbalized understanding of these instructions.       FOLLOW-UP Return in 2 weeks (on 12/27/2013) for Follow up INR at 1115h.  Hulen Luster, III Pharm.D.,  CACP

## 2013-12-15 ENCOUNTER — Ambulatory Visit
Admission: RE | Admit: 2013-12-15 | Discharge: 2013-12-15 | Disposition: A | Discharge status: Home / Self Care | Payer: Medicare Other | Source: Ambulatory Visit | Attending: *Deleted | Admitting: *Deleted

## 2013-12-15 ENCOUNTER — Other Ambulatory Visit: Payer: Self-pay | Admitting: Internal Medicine

## 2013-12-15 ENCOUNTER — Ambulatory Visit: Payer: Medicare Other | Admitting: Internal Medicine

## 2013-12-15 DIAGNOSIS — D249 Benign neoplasm of unspecified breast: Secondary | ICD-10-CM | POA: Diagnosis not present

## 2013-12-15 DIAGNOSIS — N644 Mastodynia: Secondary | ICD-10-CM

## 2013-12-15 DIAGNOSIS — N63 Unspecified lump in unspecified breast: Secondary | ICD-10-CM

## 2013-12-15 DIAGNOSIS — N61 Mastitis without abscess: Secondary | ICD-10-CM | POA: Diagnosis not present

## 2013-12-16 ENCOUNTER — Ambulatory Visit
Admission: RE | Admit: 2013-12-16 | Discharge: 2013-12-16 | Disposition: A | Discharge status: Home / Self Care | Payer: Medicare Other | Source: Ambulatory Visit | Attending: *Deleted | Admitting: *Deleted

## 2013-12-16 DIAGNOSIS — N63 Unspecified lump in unspecified breast: Secondary | ICD-10-CM

## 2013-12-16 DIAGNOSIS — N644 Mastodynia: Secondary | ICD-10-CM

## 2013-12-27 ENCOUNTER — Ambulatory Visit (INDEPENDENT_AMBULATORY_CARE_PROVIDER_SITE_OTHER): Payer: Medicare Other | Admitting: Pharmacist

## 2013-12-27 DIAGNOSIS — Z7901 Long term (current) use of anticoagulants: Secondary | ICD-10-CM

## 2013-12-27 DIAGNOSIS — I82409 Acute embolism and thrombosis of unspecified deep veins of unspecified lower extremity: Secondary | ICD-10-CM | POA: Diagnosis not present

## 2013-12-27 DIAGNOSIS — F333 Major depressive disorder, recurrent, severe with psychotic symptoms: Secondary | ICD-10-CM | POA: Diagnosis not present

## 2013-12-27 NOTE — Progress Notes (Signed)
Anti-Coagulation Progress Note  Alisha [name] is a 52 y.o. female who is currently on an anti-coagulation regimen.    RECENT RESULTS: Recent results are below, the most recent result is correlated with a dose of 75 mg. per week: Lab Results  Component Value Date   INR 5.90 12/27/2013   INR 2.00 12/13/2013   INR 2.90 11/23/2013    ANTI-COAG DOSE: Anticoagulation Dose Instructions as of 12/27/2013     [name] Tue Wed Thu Fri Sat   New Dose 0 mg 0 mg 0 mg 0 mg 11.25 mg 0 mg 0 mg    Description       Will OMIT doses on Tuesday/Wedensday.       ANTICOAG SUMMARY: Anticoagulation Episode Summary   Current INR goal 2.0-3.0  Next INR check 12/31/2013  INR from last check 5.90! (12/27/2013)  Weekly max dose   Target end date   [facility]  Preferred lab   Send INR reminders to ANTICOAG IMP   Indications  DVT [453.40] Long term current use of anticoagulant [V58.61]        Comments       Anticoagulation Care Providers   Provider Role Specialty Phone number   Zoila Shutter, MD  Internal Medicine [phone]      ANTICOAG TODAY: Anticoagulation Summary as of 12/27/2013   INR goal 2.0-3.0  Selected INR 5.90! (12/27/2013)  Next INR check 12/31/2013  Target end date    Indications  DVT [453.40] Long term current use of anticoagulant [V58.61]      Anticoagulation Episode Summary   [facility]   Preferred lab    Send INR reminders to ANTICOAG IMP   Comments     Anticoagulation Care Providers   Provider Role Specialty Phone number   Zoila Shutter, MD  Internal Medicine [phone]      PATIENT INSTRUCTIONS: Patient Instructions  Patient instructed to take medications as defined in the Anti-coagulation Track section of this encounter.  Patient instructed to OMIT tomorrow's dose and dose the day following (Tuesday/Wednesday dosing)  Patient verbalized understanding of these instructions.       FOLLOW-UP Return  in 4 days (on 12/31/2013) for Follow up INR at 1000h.  Hulen Luster, III Pharm.D., CACP

## 2013-12-27 NOTE — Patient Instructions (Signed)
Patient instructed to take medications as defined in the Anti-coagulation Track section of this encounter.  Patient instructed to OMIT tomorrow's dose and dose the day following (Tuesday/Wednesday dosing)  Patient verbalized understanding of these instructions.

## 2013-12-31 ENCOUNTER — Ambulatory Visit (INDEPENDENT_AMBULATORY_CARE_PROVIDER_SITE_OTHER): Payer: Medicare Other | Admitting: Pharmacist

## 2013-12-31 DIAGNOSIS — I82409 Acute embolism and thrombosis of unspecified deep veins of unspecified lower extremity: Secondary | ICD-10-CM

## 2013-12-31 DIAGNOSIS — I10 Essential (primary) hypertension: Secondary | ICD-10-CM | POA: Diagnosis not present

## 2013-12-31 DIAGNOSIS — Z7901 Long term (current) use of anticoagulants: Secondary | ICD-10-CM | POA: Diagnosis not present

## 2013-12-31 LAB — POCT INR: INR: 2

## 2013-12-31 NOTE — Progress Notes (Signed)
Anti-Coagulation Progress Note  Alisha [name] is a 53 y.o. female who is currently on an anti-coagulation regimen.    RECENT RESULTS: Recent results are below, the most recent result is correlated with a dose of having held warfarin x 2 days, then resumed taking  1 & 1/2 x 7.5mg  tablet x 1 day and was seen today.  Lab Results  Component Value Date   INR 2.0 12/31/2013   INR 5.90 12/27/2013   INR 2.00 12/13/2013    ANTI-COAG DOSE: Anticoagulation Dose Instructions as of 12/31/2013     Dorene Grebe Tue Wed Thu Fri Sat   New Dose 11.25 mg 0 mg 0 mg 0 mg 0 mg 11.25 mg 11.25 mg       ANTICOAG SUMMARY: Anticoagulation Episode Summary   Current INR goal 2.0-3.0  Next INR check 12/31/2013  INR from last check 2.0 (12/31/2013)  Weekly max dose   Target end date   [facility]  Preferred lab   Send INR reminders to ANTICOAG IMP   Indications  DVT [453.40] Long term current use of anticoagulant [V58.61]        Comments       Anticoagulation Care Providers   Provider Role Specialty Phone number   Burman Freestone, MD  Internal Medicine [phone]      ANTICOAG TODAY: Anticoagulation Summary as of 12/31/2013   INR goal 2.0-3.0  Selected INR 2.0 (12/31/2013)  Next INR check   Target end date    Indications  DVT [453.40] Long term current use of anticoagulant [V58.61]      Anticoagulation Episode Summary   [facility]   Preferred lab    Send INR reminders to ANTICOAG IMP   Comments     Anticoagulation Care Providers   Provider Role Specialty Phone number   Burman Freestone, MD  Internal Medicine [phone]      PATIENT INSTRUCTIONS: Patient Instructions  Patient instructed to take medications as defined in the Anti-coagulation Track section of this encounter.  Patient instructed to take today's dose.  Patient verbalized understanding of these instructions.       FOLLOW-UP Return in 3 days (on 01/03/2014) for Follow up INR at  2:45pm.  Jorene Guest, III Pharm.D., CACP

## 2013-12-31 NOTE — Patient Instructions (Signed)
Patient instructed to take medications as defined in the Anti-coagulation Track section of this encounter.  Patient instructed to take today's dose.  Patient verbalized understanding of these instructions.    

## 2014-01-03 ENCOUNTER — Ambulatory Visit (INDEPENDENT_AMBULATORY_CARE_PROVIDER_SITE_OTHER): Payer: Medicare Other | Admitting: Pharmacist

## 2014-01-03 ENCOUNTER — Encounter: Payer: Self-pay | Admitting: Internal Medicine

## 2014-01-03 ENCOUNTER — Ambulatory Visit (INDEPENDENT_AMBULATORY_CARE_PROVIDER_SITE_OTHER): Payer: Medicare Other | Admitting: Internal Medicine

## 2014-01-03 VITALS — BP 86/67 | HR 74 | Temp 98.0°F | Wt 195.5 lb

## 2014-01-03 DIAGNOSIS — Z7901 Long term (current) use of anticoagulants: Secondary | ICD-10-CM

## 2014-01-03 DIAGNOSIS — G894 Chronic pain syndrome: Secondary | ICD-10-CM

## 2014-01-03 DIAGNOSIS — M7989 Other specified soft tissue disorders: Secondary | ICD-10-CM

## 2014-01-03 DIAGNOSIS — I10 Essential (primary) hypertension: Secondary | ICD-10-CM

## 2014-01-03 DIAGNOSIS — Z86718 Personal history of other venous thrombosis and embolism: Secondary | ICD-10-CM

## 2014-01-03 DIAGNOSIS — I82409 Acute embolism and thrombosis of unspecified deep veins of unspecified lower extremity: Secondary | ICD-10-CM | POA: Diagnosis not present

## 2014-01-03 DIAGNOSIS — E785 Hyperlipidemia, unspecified: Secondary | ICD-10-CM | POA: Diagnosis not present

## 2014-01-03 LAB — CBC
HCT: 44.1 % (ref 36.0–46.0)
Hemoglobin: 15.3 g/dL — ABNORMAL HIGH (ref 12.0–15.0)
MCH: 33.8 pg (ref 26.0–34.0)
MCHC: 34.7 g/dL (ref 30.0–36.0)
MCV: 97.4 fL (ref 78.0–100.0)
PLATELETS: 189 10*3/uL (ref 150–400)
RBC: 4.53 MIL/uL (ref 3.87–5.11)
RDW: 13.6 % (ref 11.5–15.5)
WBC: 8.4 10*3/uL (ref 4.0–10.5)

## 2014-01-03 LAB — POCT INR: INR: 5.4

## 2014-01-03 LAB — PROTIME-INR
INR: 5.45 (ref <1.50)
Prothrombin Time: 47.4 seconds — ABNORMAL HIGH (ref 11.6–15.2)

## 2014-01-03 MED ORDER — ACETAMINOPHEN 500 MG PO TABS: 1000.0000 mg | ORAL_TABLET | Freq: Three times a day (TID) | ORAL | Status: DC

## 2014-01-03 NOTE — Progress Notes (Signed)
Subjective:   Patient ID: Alisha [name] female   DOB: [birth year] 53 y.o.   MRN: [id number]  Chief Complaint  Patient presents with  . Referral    pt has appt 01/07/2014 @ [facility]    HPI: [name] is a 53 y.o. woman with past medical history significant for peripheral vascular disease, history of DVTs, restless leg syndrome, chronic pain syndrome, DJD, depression, and anxiety.   She presents today requesting referrals to surgery (for breast fibroid removal, appt already made after breast US/mammo and subsequent biopsy), pain clinic and requests scooter script.  No s/s of bleeding. She has had some dizziness with standing/lightheaded esp when she first wakes up. Legs give way occasionally.    C/o chronic leg pain, toes cramp. No help with clinoril.    Review of Systems: Constitutional: Denies fever, chills, diaphoresis, appetite change   HEENT: Denies photophobia, eye pain, redness, hearing loss, ear pain, congestion, sore throat, rhinorrhea, sneezing, mouth sores, trouble swallowing, neck pain, neck stiffness and tinnitus.  Respiratory: Denies SOB, DOE, cough, chest tightness, and wheezing.  Cardiovascular: Denies chest pain, palpitations and leg swelling.  Gastrointestinal: Denies nausea, vomiting, abdominal pain, diarrhea, constipation,blood in stool and abdominal distention.  Genitourinary: Denies dysuria, urgency, frequency, hematuria, flank pain and difficulty urinating.  Musculoskeletal: chronic myalgias  Skin: Denies pallor, rash and wound.  Neurological: Denies seizures, syncope, weakness, numbness and headaches.    Past Medical History  Diagnosis Date  . DVT (deep venous thrombosis)     bilateral, 2 episodes: Requires lifelong therapy  . Chronic pain syndrome   . Restless leg syndrome   . Depression   . Anxiety   . Hyperlipidemia   . Hypertension   . PVD (peripheral vascular disease)     s/p L fem-pop bypass 11/21/08; graft occluded 12/28/08;  aortogram w/  bilat LE runoff: 1.  bilat diffuse SFA occlusive dz, 2.  Mod to severe above-knee popliteal dz,  3.  Bilat 3-vessel runoff w/ mild tibial occlusive dz.  . Tobacco abuse   . Degenerative joint disease of knee   . Breast lump 02/2008    Biopsy 05/2008: showed no evidence of malignancy  . Irregular menses 9/08  . Pulmonary edema   . Anxiety   . Joint pain   . Mixed stress and urge urinary incontinence     Being [facility], underwent cystoscopy & uroflowmetry   . Endometrial mass 10/12/2012    Endometrium, biopsy on 11/04/12 - PROLIFERATIVE ENDOMETRIUM AND ABUNDANT MUCUS. NO HYPERPLASIA OR CARCINOMA.   . Sigmoid diverticulitis 10/26/2012   Current Outpatient Prescriptions  Medication Sig Dispense Refill  . albuterol (PROAIR HFA) 108 (90 BASE) MCG/ACT inhaler Inhale 2 puffs into the lungs every 6 (six) hours as needed for wheezing. Take 1-2 puffs as needed every 4-6 hours as needed for shortness of breath  1 Inhaler  3  . amLODipine (NORVASC) 10 MG tablet Take 1 tablet (10 mg total) by mouth daily.  30 tablet  11  . aspirin 81 MG tablet Take 1 tablet (81 mg total) by mouth daily.  100 tablet  3  . atorvastatin (LIPITOR) 40 MG tablet Take 1 tablet (40 mg total) by mouth at bedtime.  90 tablet  3  . buPROPion (WELLBUTRIN XL) 150 MG 24 hr tablet Take 150 mg by mouth daily.      . carvedilol (COREG) 25 MG tablet TAKE ONE TABLET BY MOUTH TWICE DAILY  180 tablet  0  . diazepam (  VALIUM) 5 MG tablet Take 10 mg by mouth every 8 (eight) hours as needed.       . DULoxetine (CYMBALTA) 60 MG capsule Take 60 mg by mouth. Take 1 capsule in the morning and 1 capsule at bedtime       . gabapentin (NEURONTIN) 800 MG tablet TAKE ONE TABLET BY MOUTH IN THE MORNING--1 AT MIDDAY--& 2 AT NIGHT  120 tablet  11  . hydrALAZINE (APRESOLINE) 25 MG tablet Take 1 tablet (25 mg total) by mouth 3 (three) times daily.  90 tablet  3  . hydrOXYzine (ATARAX/VISTARIL) 25 MG tablet Take 25 mg by mouth 3 (three) times  daily as needed.      Marland Kitchen lisinopril (PRINIVIL,ZESTRIL) 40 MG tablet Take 1 tablet (40 mg total) by mouth daily.  30 tablet  6  . lisinopril-hydrochlorothiazide (PRINZIDE,ZESTORETIC) 20-12.5 MG per tablet Take 2 tablets by mouth daily.  60 tablet  5  . sulindac (CLINORIL) 150 MG tablet Take 1 tablet (150 mg total) by mouth 2 (two) times daily. With food  60 tablet  0  . traMADol (ULTRAM) 50 MG tablet Take 2 tablets (100 mg total) by mouth every 6 (six) hours as needed.  64 tablet  0  . traZODone (DESYREL) 100 MG tablet Take 100-200 mg by mouth at bedtime.      Marland Kitchen warfarin (COUMADIN) 7.5 MG tablet Take 1& 1/2 tablets on  Days of week except on Wednesday:  Take only 1 tablet every Wednesday.  50 tablet  3  . ziprasidone (GEODON) 60 MG capsule Take 60 mg by mouth 2 (two) times daily with a meal.        No current facility-administered medications for this visit.   Family History  Problem Relation Age of Onset  . Cancer Mother     breast   History   Social History  . Marital Status: Divorced    Spouse Name: N/A    Number of Children: N/A  . Years of Education: N/A   Social History Main Topics  . Smoking status: Current Every Day Smoker -- 0.25 packs/day for 18 years    Types: Cigarettes  . Smokeless tobacco: Never Used  . Alcohol Use: No  . Drug Use: No  . Sexual Activity: No   Other Topics Concern  . Not on file   Social History Narrative   Financial assistance application initiated. Patient needs to submit further paperwork to complete   [facility]  September 11, 2010 2:04 PM   Financial assistance approved for 100% discount at [facility] and has [facility] card   [name]  October 04, 2010 5:29 PM    Objective:  Physical Exam: Danley Danker Vitals:   01/03/14 1542  BP: 82/64  Pulse: 75  Temp: 98 F (36.7 C)  TempSrc: Oral  Weight: 195 lb 8 oz (88.678 kg)  SpO2: 97%   General: appears as stated age, no acute distress HEENT: PERRL, EOMI, no scleral icterus, no conjunctival  pallor Cardiac: RRR, no rubs, murmurs or gallops Pulm: clear to auscultation bilaterally, moving normal volumes of air Abd: soft, nontender, nondistended, BS normoactive Ext: warm and well perfused, no pedal edema Neuro: alert and oriented X3, cranial nerves II-XII grossly intact  Assessment & Plan:  Case and care discussed with Dr. Marinda Elk.  Please see problem oriented charting for further details. Patient to return in 1 week for BP follow up.  -Medicaid for [facility] -Pain mgmt referral -PT referral for wheelchair eval

## 2014-01-03 NOTE — Assessment & Plan Note (Signed)
BP Readings from Last 3 Encounters:  01/03/14 86/67  11/25/13 127/84  11/05/13 122/78    Lab Results  Component Value Date   NA 141 07/16/2013   K 4.4 07/16/2013   CREATININE 0.83 07/16/2013    Assessment: Blood pressure control: controlled Progress toward BP goal:  improved Comments: Medication compliance is always an issue for this patient.  Today she reports she is taking all of her medications, but when I called the pharmacy, Amlodipine 10mg  #30 was filled on 11/01/13, so she should be out of thsi medication.  Hydralazine #90 filled on 11/16/13 and HCTZ-Lisinopril was filled on 12/24.    Plan: Medications:  Discontinue hydralazine as this was the last medication added, continue coreg, amlodipine, and hctz-lisinopril Other plans: check CBC given elevated INR and hypotension

## 2014-01-03 NOTE — Patient Instructions (Signed)
-  Be sure to attend your appointment at [facility] -We will determine what type of physical therapy evaluation you need for a scooter -We will work on your pain management referral, in the meanwhile, you may take tylenol 1000mg  three times daily (scheduled, regardless of if you have pain)  Please be sure to bring all of your medications with you to every visit.  Should you have any new or worsening symptoms, please be sure to call the clinic at [phone].

## 2014-01-03 NOTE — Assessment & Plan Note (Signed)
She reports sulindac was ineffective.  Will discontinue this medication.  She can take tylenol 1000mg  TID until/if she is seen in pain clinic.

## 2014-01-04 NOTE — Patient Instructions (Signed)
Patient instructed to take medications as defined in the Anti-coagulation Track section of this encounter.  Patient instructed to OMIT dose on Monday 5-JAN-15.  Patient verbalized understanding of these instructions.    

## 2014-01-04 NOTE — Progress Notes (Signed)
Anti-Coagulation Progress Note  Alisha [name] is a 53 y.o. female who is currently on an anti-coagulation regimen.    RECENT RESULTS: Recent results are below, the most recent result is correlated with a dose of 11.25mg   Per day (1 and 1/2 tablets of her 7.5mg  warfarin).  Lab Results  Component Value Date   INR 5.40 01/03/2014   INR 5.45* 01/03/2014   INR 2.0 12/31/2013    ANTI-COAG DOSE: Anticoagulation Dose Instructions as of 01/03/2014     Dorene Grebe Tue Wed Thu Fri Sat   New Dose 0 mg 0 mg 3.75 mg 3.75 mg 3.75 mg 0 mg 0 mg       ANTICOAG SUMMARY: Anticoagulation Episode Summary   Current INR goal 2.0-3.0  Next INR check 01/07/2014  INR from last check 5.40! (01/03/2014)  Weekly max dose   Target end date   [facility]  Preferred lab   Send INR reminders to ANTICOAG IMP   Indications  DVT [453.40] Long term current use of anticoagulant [V58.61]        Comments       Anticoagulation Care Providers   Provider Role Specialty Phone number   Burman Freestone, MD  Internal Medicine [phone]      ANTICOAG TODAY: Anticoagulation Summary as of 01/03/2014   INR goal 2.0-3.0  Selected INR 5.40! (01/03/2014)  Next INR check 01/07/2014  Target end date    Indications  DVT [453.40] Long term current use of anticoagulant [V58.61]      Anticoagulation Episode Summary   [facility]   Preferred lab    Send INR reminders to ANTICOAG IMP   Comments     Anticoagulation Care Providers   Provider Role Specialty Phone number   Burman Freestone, MD  Internal Medicine [phone]      PATIENT INSTRUCTIONS: Patient Instructions  Patient instructed to take medications as defined in the Anti-coagulation Track section of this encounter.  Patient instructed to OMIT dose on Monday 5-JAN-15.  Patient verbalized understanding of these instructions.       FOLLOW-UP Return in 4 days (on 01/07/2014) for Follow up INR at 1015h.  Jorene Guest,  III Pharm.D., CACP

## 2014-01-04 NOTE — Progress Notes (Signed)
Case discussed with Dr. Sharda soon after the resident saw the patient.  We reviewed the resident's history and exam and pertinent patient test results.  I agree with the assessment, diagnosis, and plan of care documented in the resident's note. 

## 2014-01-06 ENCOUNTER — Other Ambulatory Visit: Payer: Self-pay | Admitting: Internal Medicine

## 2014-01-06 ENCOUNTER — Encounter: Payer: Self-pay | Admitting: Nurse Practitioner

## 2014-01-07 ENCOUNTER — Encounter (INDEPENDENT_AMBULATORY_CARE_PROVIDER_SITE_OTHER): Payer: Self-pay | Admitting: Surgery

## 2014-01-07 ENCOUNTER — Ambulatory Visit (INDEPENDENT_AMBULATORY_CARE_PROVIDER_SITE_OTHER): Payer: Medicare Other | Admitting: Pharmacist

## 2014-01-07 ENCOUNTER — Ambulatory Visit (INDEPENDENT_AMBULATORY_CARE_PROVIDER_SITE_OTHER): Payer: Medicare Other | Admitting: Surgery

## 2014-01-07 VITALS — BP 110/70 | HR 76 | Temp 97.1°F | Resp 14 | Ht 64.5 in | Wt 195.8 lb

## 2014-01-07 DIAGNOSIS — E785 Hyperlipidemia, unspecified: Secondary | ICD-10-CM | POA: Diagnosis not present

## 2014-01-07 DIAGNOSIS — D249 Benign neoplasm of unspecified breast: Secondary | ICD-10-CM | POA: Diagnosis not present

## 2014-01-07 DIAGNOSIS — I82409 Acute embolism and thrombosis of unspecified deep veins of unspecified lower extremity: Secondary | ICD-10-CM | POA: Diagnosis not present

## 2014-01-07 DIAGNOSIS — D242 Benign neoplasm of left breast: Secondary | ICD-10-CM

## 2014-01-07 DIAGNOSIS — D241 Benign neoplasm of right breast: Secondary | ICD-10-CM

## 2014-01-07 DIAGNOSIS — Z7901 Long term (current) use of anticoagulants: Secondary | ICD-10-CM | POA: Diagnosis not present

## 2014-01-07 LAB — POCT INR: INR: 2.2

## 2014-01-07 NOTE — Patient Instructions (Signed)
Lumpectomy A lumpectomy is a form of "breast conserving" or "breast preservation" surgery. It may also be referred to as a partial mastectomy. During a lumpectomy, the portion of the breast that contains the cancerous tumor or breast mass (the lump) is removed. Some normal tissue around the lump may also be removed to make sure all the tumor has been removed. This surgery should take 40 minutes or less. LET YOUR HEALTH CARE PROVIDER KNOW ABOUT:  Any allergies you have.  All medicines you are taking, including vitamins, herbs, eye drops, creams, and over-the-counter medicines.  Previous problems you or members of your family have had with the use of anesthetics.  Any blood disorders you have.  Previous surgeries you have had.  Medical conditions you have. RISKS AND COMPLICATIONS Generally, this is a safe procedure. However, as with any procedure, complications can occur. Possible complications include:  Bleeding.  Infection.  Pain.  Temporary swelling.  Change in the shape of the breast, particularly if a large portion is removed. BEFORE THE PROCEDURE  Ask your health care provider about changing or stopping your regular medicines.  Do not eat or drink anything for 7 8 hours before the surgery or as directed by your health care provider. Ask your health care provider if you can take a sip of water with any approved medicines.  On the day of surgery, your healthcare provider will use a mammogram or ultrasound to locate and mark the tumor in your breast. These markings on your breast will show where the cut (incision) will be made. PROCEDURE   An IV tube will be put into one of your veins.  You may be given medicine to help you relax before the surgery (sedative). You will be given one of the following:  A medicine that numbs the area (local anesthesia).  A medicine that makes you go to sleep (general anesthesia).  Your health care provider will use a kind of electric scalpel  that uses heat to minimize bleeding (electrocautery knife).  A curved incision (like a smile or frown) that follows the natural curve of your breast is made, to allow for minimal scarring and better healing.  The tumor will be removed with some of the surrounding tissue. This will be sent to the lab for analysis. Your health care provider may also remove your lymph nodes at this time if needed.  Sometimes, but not always, a rubber tube called a drain will be surgically inserted into your breast area or armpit to collect excess fluid that may accumulate in the space where the tumor was. This drain is connected to a plastic bulb on the outside of your body. This drain creates suction to help remove the fluid.  The incisions will be closed with stitches (sutures).  A bandage may be placed over the incisions. AFTER THE PROCEDURE  You will be taken to the recovery area.  You will be given medicine for pain.  A small rubber drain may be placed in the breast for 2 3 days to prevent a collection of blood (hematoma) from developing in the breast. You will be given instructions on caring for the drain before you go home.  A pressure bandage (dressing) will be applied for 1 2 days to prevent bleeding. Ask your health care provider how to care for your bandage at home. Document Released: 01/27/2007 Document Revised: 08/18/2013 Document Reviewed: 05/21/2013 [employer] Patient Information 2014 [employer].  

## 2014-01-07 NOTE — Progress Notes (Signed)
Anti-Coagulation Progress Note  Alisha [name] is a 53 y.o. female who is currently on an anti-coagulation regimen.    RECENT RESULTS: Recent results are below, the most recent result is correlated with a dose of warfarin having been held x 1 followed by 1/2 x 7.5mg  (3.75mg ) for the past 3 days before being seen today. Lab Results  Component Value Date   INR 2.2 01/07/2014   INR 5.40 01/03/2014   INR 5.45* 01/03/2014    ANTI-COAG DOSE: Anticoagulation Dose Instructions as of 01/07/2014     Dorene Grebe Tue Wed Thu Fri Sat   New Dose 7.5 mg 3.75 mg 7.5 mg 7.5 mg 3.75 mg 7.5 mg 3.75 mg       ANTICOAG SUMMARY: Anticoagulation Episode Summary   Current INR goal 2.0-3.0  Next INR check 01/21/2014  INR from last check 2.2 (01/07/2014)  Weekly max dose   Target end date   [facility]  Preferred lab   Send INR reminders to ANTICOAG IMP   Indications  DVT [453.40] Long term current use of anticoagulant [V58.61]        Comments       Anticoagulation Care Providers   Provider Role Specialty Phone number   Burman Freestone, MD  Internal Medicine [phone]      ANTICOAG TODAY: Anticoagulation Summary as of 01/07/2014   INR goal 2.0-3.0  Selected INR 2.2 (01/07/2014)  Next INR check 01/21/2014  Target end date    Indications  DVT [453.40] Long term current use of anticoagulant [V58.61]      Anticoagulation Episode Summary   [facility] check [facility]   Preferred lab    Send INR reminders to ANTICOAG IMP   Comments     Anticoagulation Care Providers   Provider Role Specialty Phone number   Burman Freestone, MD  Internal Medicine [phone]      PATIENT INSTRUCTIONS: Patient Instructions  Patient instructed to take medications as defined in the Anti-coagulation Track section of this encounter.  Patient instructed to take today's dose.  Patient verbalized understanding of these instructions.       FOLLOW-UP Return in 2 weeks (on 01/21/2014) for  Follow up INR on FRIDAY at [place].  Jorene Guest, III Pharm.D., CACP

## 2014-01-07 NOTE — Progress Notes (Signed)
Patient ID: Alisha [name], female   DOB: 05-Apr-1961, 53 y.o.   MRN: [id number]  Chief Complaint  Patient presents with  . New Evaluation    eval bilateral br benign fibroadenoma    HPI Alisha [name] is a 53 y.o. female.  Patient presents with chief complaint of bilateral breast lumps. Patient has history of fibroadenoma and underwent needle localization with excision 2009. She is developed 2 lumps one in each breast and they are centrally located. Each is very tender. She underwent ultrasound-guided core biopsy of the findings consistent with fibroadenoma. Her causing discomfort the patient would like to have these removed. HPI  Past Medical History  Diagnosis Date  . DVT (deep venous thrombosis)     bilateral, 2 episodes: Requires lifelong therapy  . Chronic pain syndrome   . Restless leg syndrome   . Depression   . Anxiety   . Hyperlipidemia   . Hypertension   . PVD (peripheral vascular disease)     s/p L fem-pop bypass 11/21/08; graft occluded 12/28/08;  aortogram w/ bilat LE runoff: 1.  bilat diffuse SFA occlusive dz, 2.  Mod to severe above-knee popliteal dz,  3.  Bilat 3-vessel runoff w/ mild tibial occlusive dz.  . Tobacco abuse   . Degenerative joint disease of knee   . Breast lump 02/2008    Biopsy 05/2008: showed no evidence of malignancy  . Irregular menses 9/08  . Pulmonary edema   . Anxiety   . Joint pain   . Mixed stress and urge urinary incontinence     [facility], underwent cystoscopy & uroflowmetry   . Endometrial mass 10/12/2012    Endometrium, biopsy on 11/04/12 - PROLIFERATIVE ENDOMETRIUM AND ABUNDANT MUCUS. NO HYPERPLASIA OR CARCINOMA.   . Sigmoid diverticulitis 10/26/2012    Past Surgical History  Procedure Laterality Date  . Femoral bypass  11/09  .  right external iliac artery stent    . Right breast needle-localized lumpectomy    . Pr vein bypass graft,aorto-fem-pop    . Carotid endarterectomy      Family History  Problem  Relation Age of Onset  . Cancer Mother     breast    Social History History  Substance Use Topics  . Smoking status: Current Every Day Smoker -- 0.25 packs/day for 18 years    Types: Cigarettes  . Smokeless tobacco: Never Used  . Alcohol Use: Yes     Comment: rare    Allergies  Allergen Reactions  . Propoxyphene N-Acetaminophen Swelling    Current Outpatient Prescriptions  Medication Sig Dispense Refill  . albuterol (PROAIR HFA) 108 (90 BASE) MCG/ACT inhaler Inhale 2 puffs into the lungs every 6 (six) hours as needed for wheezing. Take 1-2 puffs as needed every 4-6 hours as needed for shortness of breath  1 Inhaler  3  . amLODipine (NORVASC) 10 MG tablet Take 1 tablet (10 mg total) by mouth daily.  30 tablet  11  . aspirin 81 MG tablet Take 1 tablet (81 mg total) by mouth daily.  100 tablet  3  . atorvastatin (LIPITOR) 40 MG tablet Take 1 tablet (40 mg total) by mouth at bedtime.  90 tablet  3  . buPROPion (WELLBUTRIN XL) 150 MG 24 hr tablet Take 150 mg by mouth daily.      . carvedilol (COREG) 25 MG tablet TAKE ONE TABLET BY MOUTH TWICE DAILY  180 tablet  0  . diazepam (VALIUM) 5 MG tablet Take 10  mg by mouth every 8 (eight) hours as needed.       . DULoxetine (CYMBALTA) 60 MG capsule Take 60 mg by mouth. Take 1 capsule in the morning and 1 capsule at bedtime       . gabapentin (NEURONTIN) 800 MG tablet TAKE 1 TABLET BY MOUTH IN THE MORNING, 1 TABLET BY MOUTH MIDDAY, AND 2 TABLETS AT BEDTIME  120 tablet  3  . hydrOXYzine (ATARAX/VISTARIL) 25 MG tablet Take 25 mg by mouth 3 (three) times daily as needed.      Marland Kitchen lisinopril-hydrochlorothiazide (PRINZIDE,ZESTORETIC) 20-12.5 MG per tablet Take 2 tablets by mouth daily.  60 tablet  5  . traZODone (DESYREL) 100 MG tablet Take 100-200 mg by mouth at bedtime.      Marland Kitchen warfarin (COUMADIN) 7.5 MG tablet Take 1& 1/2 tablets on  Days of week except on Wednesday:  Take only 1 tablet every Wednesday.  50 tablet  3  . ziprasidone (GEODON) 60 MG  capsule Take 60 mg by mouth 2 (two) times daily with a meal.        No current facility-administered medications for this visit.    Review of Systems Review of Systems  Constitutional: Negative.   HENT: Negative.   Eyes: Negative.   Respiratory: Negative.   Cardiovascular: Negative for leg swelling.  Endocrine: Negative.   Musculoskeletal: Positive for arthralgias, back pain and myalgias.  Allergic/Immunologic: Negative.   Neurological: Negative.   Hematological: Negative.   Psychiatric/Behavioral: Negative.     Blood pressure 110/70, pulse 76, temperature 97.1 F (36.2 C), temperature source Temporal, resp. rate 14, height 5' 4.5" (1.638 m), weight 195 lb 12.8 oz (88.814 kg).  Physical Exam Physical Exam  Constitutional: She appears well-developed and well-nourished.  HENT:  Head: Normocephalic and atraumatic.  Eyes: Pupils are equal, round, and reactive to light. No scleral icterus.  Neck: Normal range of motion. Neck supple.  Cardiovascular: Normal rate and regular rhythm.   Pulmonary/Chest:    Musculoskeletal: Normal range of motion.  Neurological: She is alert.  Skin: Skin is warm and dry.  Psychiatric: She has a normal mood and affect. Her behavior is normal. Thought content normal.    Data Reviewed HPI: The patient returns for a followup after a ultrasound-guided  biopsies of bilateral breast masses 12/15/2013. The patient is doing  well and denies any biopsy site complications other than bruising  and tenderness.  Pathology results: Right breast pathology reveals densely hyalinized  stroma which may be related to a hyalinized fibroadenoma and left  breast pathology reveals a fibroadenoma. This is concordant with the  imaging findings.  Biopsy site: Clean, dry, and intact. Steri-Strips have been removed  and bandages with 4x4s overlie the biopsy site.  PLAN: All the patient's questions were answered. She is aware of the  results and demonstrates  understanding. The patient is scheduled to  follow-up with Dr. Brantley Stage to discuss the possibility of surgical  excision on 01/10/2014 at 10:30 a.m.   Assessment    Bilateral breast fibroadenoma painful Patient Active Problem List   Diagnosis Date Noted  . Health care maintenance 09/16/2013  . PVD (peripheral vascular disease) 09/02/2013  . Atherosclerosis of native arteries of the extremities with intermittent claudication 09/02/2013  . Pain in limb 09/02/2013  . Sigmoid diverticulitis 10/26/2012  . Metrorrhagia 10/12/2012  . Endometrial mass 10/12/2012  . 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 Prolapse of female pelvic organs 05/08/2012  . Osteoarthritis of both knees 02/15/2012  .  Long term current use of anticoagulant 01/20/2011  . Chronic pain syndrome 07/05/2010  . RESTLESS LEG SYNDROME 02/27/2010  . DVT 02/22/2010  . VISUAL ACUITY, DECREASED 01/12/2010  . THYROID STIMULATING HORMONE, ABNORMAL 09/25/2009  . DEPRESSION 07/17/2009  . UNSPECIFIED DISORDER TEETH&SUPPORTING STRUCTURES 01/31/2009  . BREAST LUMP 03/03/2008  . TOBACCO ABUSE 10/20/2006  . HYPERTENSION 10/20/2006  . PERIPHERAL VASCULAR DISEASE 10/20/2006  . HYPERLIPIDEMIA, MIXED 10/10/2006      Plan    Patient like to have those removed due to discomfort. We'll need to hold Coumadin 5 days before procedure.The procedure has been discussed with the patient. Alternatives to surgery have been discussed with the patient.  Risks of surgery include bleeding,  Infection,  Seroma formation, death,  and the need for further surgery.   The patient understands and wishes to proceed.       Oris Calmes A. 01/07/2014, 11:28 AM

## 2014-01-07 NOTE — Patient Instructions (Signed)
Patient instructed to take medications as defined in the Anti-coagulation Track section of this encounter.  Patient instructed to take today's dose.  Patient verbalized understanding of these instructions.    

## 2014-01-11 ENCOUNTER — Encounter: Payer: Self-pay | Admitting: Internal Medicine

## 2014-01-11 ENCOUNTER — Ambulatory Visit (INDEPENDENT_AMBULATORY_CARE_PROVIDER_SITE_OTHER): Payer: Medicare Other | Admitting: Internal Medicine

## 2014-01-11 VITALS — BP 157/104 | HR 71 | Temp 97.5°F | Wt 198.3 lb

## 2014-01-11 DIAGNOSIS — Z7901 Long term (current) use of anticoagulants: Secondary | ICD-10-CM | POA: Diagnosis not present

## 2014-01-11 DIAGNOSIS — I10 Essential (primary) hypertension: Secondary | ICD-10-CM

## 2014-01-11 DIAGNOSIS — I82409 Acute embolism and thrombosis of unspecified deep veins of unspecified lower extremity: Secondary | ICD-10-CM | POA: Diagnosis not present

## 2014-01-11 DIAGNOSIS — M7989 Other specified soft tissue disorders: Secondary | ICD-10-CM | POA: Diagnosis not present

## 2014-01-11 DIAGNOSIS — Z86718 Personal history of other venous thrombosis and embolism: Secondary | ICD-10-CM | POA: Diagnosis not present

## 2014-01-11 DIAGNOSIS — G894 Chronic pain syndrome: Secondary | ICD-10-CM | POA: Diagnosis not present

## 2014-01-11 DIAGNOSIS — I739 Peripheral vascular disease, unspecified: Secondary | ICD-10-CM | POA: Diagnosis not present

## 2014-01-11 MED ORDER — AMLODIPINE BESYLATE 10 MG PO TABS: 10.0000 mg | ORAL_TABLET | Freq: Every day | ORAL | Status: DC

## 2014-01-11 MED ORDER — NAPROXEN 500 MG PO TABS: 500.0000 mg | ORAL_TABLET | Freq: Two times a day (BID) | ORAL | Status: DC

## 2014-01-11 NOTE — Progress Notes (Signed)
I have reviewed Dr. Gladstone Pih note.  Mr. Alisha [name] is on coumadin for DVT.  Plan as per Dr. Gladstone Pih note.

## 2014-01-11 NOTE — Assessment & Plan Note (Signed)
BP Readings from Last 3 Encounters:  01/11/14 157/104  01/07/14 110/70  01/03/14 86/67    Lab Results  Component Value Date   NA 141 07/16/2013   K 4.4 07/16/2013   CREATININE 0.83 07/16/2013    Assessment: Blood pressure control:  elevated Progress toward BP goal:   deteriorated  Comments: does not have amlodipine bottle with her, hasn't been filled  Plan: Medications:  Again, med compliance is always an issue; she again reports taking all of her meds, she has her bottles with her today - and is missing amlodipine.  I have asked that she fill this medication.  She will continue HCTZ-lisinopril and coreg; hydral was d/c at the last visit

## 2014-01-11 NOTE — Patient Instructions (Signed)
-  Please refill your amlodipine -You may try naproxen 500mg  twice daily for your pain (WITH FOOD)- be sure to schedule your appointment with pain clinic  Please be sure to bring all of your medications with you to every visit.  Should you have any new or worsening symptoms, please be sure to call the clinic at [phone].

## 2014-01-11 NOTE — Assessment & Plan Note (Signed)
Patient to reschedule appointment with pain clinic.  She would like to retry naproxen 500 bid until pain clinic appt.

## 2014-01-11 NOTE — Progress Notes (Signed)
Subjective:   Patient ID: Alisha [name] female   DOB: 03/16/1961 53 y.o.   MRN: [id number]  HPI: [name] is a 53 y.o. woman with past medical history significant for peripheral vascular disease, history of DVTs, restless leg syndrome, chronic pain syndrome, DJD, depression, and anxiety.   She presents today for HTN follow up. No new complaints. She has bottles of medications with her, missing amlodipine.    Review of Systems: Constitutional: Denies fever, chills, diaphoresis, appetite change  HEENT: Denies photophobia, eye pain, redness, hearing loss, ear pain, congestion, sore throat, rhinorrhea, sneezing, mouth sores, trouble swallowing, neck pain, neck stiffness and tinnitus.  Respiratory: Denies SOB, DOE, cough, chest tightness, and wheezing.  Cardiovascular: Denies chest pain, palpitations and leg swelling.  Gastrointestinal: Denies nausea, vomiting, abdominal pain, diarrhea, constipation,blood in stool and abdominal distention.  Genitourinary: Denies dysuria, urgency, frequency, hematuria, flank pain and difficulty urinating.  Musculoskeletal: chronic myalgias  Skin: Denies pallor, rash and wound.  Neurological: Denies seizures, syncope, weakness, numbness and headaches.     Past Medical History  Diagnosis Date  . DVT (deep venous thrombosis)     bilateral, 2 episodes: Requires lifelong therapy  . Chronic pain syndrome   . Restless leg syndrome   . Depression   . Anxiety   . Hyperlipidemia   . Hypertension   . PVD (peripheral vascular disease)     s/p L fem-pop bypass 11/21/08; graft occluded 12/28/08;  aortogram w/ bilat LE runoff: 1.  bilat diffuse SFA occlusive dz, 2.  Mod to severe above-knee popliteal dz,  3.  Bilat 3-vessel runoff w/ mild tibial occlusive dz.  . Tobacco abuse   . Degenerative joint disease of knee   . Breast lump 02/2008    Biopsy 05/2008: showed no evidence of malignancy  . Irregular menses 9/08  . Pulmonary edema   . Anxiety   . Joint  pain   . Mixed stress and urge urinary incontinence     [facility], underwent cystoscopy & uroflowmetry   . Endometrial mass 10/12/2012    Endometrium, biopsy on 11/04/12 - PROLIFERATIVE ENDOMETRIUM AND ABUNDANT MUCUS. NO HYPERPLASIA OR CARCINOMA.   . Sigmoid diverticulitis 10/26/2012   Current Outpatient Prescriptions  Medication Sig Dispense Refill  . albuterol (PROAIR HFA) 108 (90 BASE) MCG/ACT inhaler Inhale 2 puffs into the lungs every 6 (six) hours as needed for wheezing. Take 1-2 puffs as needed every 4-6 hours as needed for shortness of breath  1 Inhaler  3  . amLODipine (NORVASC) 10 MG tablet Take 1 tablet (10 mg total) by mouth daily.  30 tablet  11  . aspirin 81 MG tablet Take 1 tablet (81 mg total) by mouth daily.  100 tablet  3  . atorvastatin (LIPITOR) 40 MG tablet Take 1 tablet (40 mg total) by mouth at bedtime.  90 tablet  3  . buPROPion (WELLBUTRIN XL) 150 MG 24 hr tablet Take 150 mg by mouth daily.      . carvedilol (COREG) 25 MG tablet TAKE ONE TABLET BY MOUTH TWICE DAILY  180 tablet  0  . diazepam (VALIUM) 5 MG tablet Take 10 mg by mouth every 8 (eight) hours as needed.       . DULoxetine (CYMBALTA) 60 MG capsule Take 60 mg by mouth. Take 1 capsule in the morning and 1 capsule at bedtime       . gabapentin (NEURONTIN) 800 MG tablet TAKE 1 TABLET BY MOUTH IN THE MORNING,  1 TABLET BY MOUTH MIDDAY, AND 2 TABLETS AT BEDTIME  120 tablet  3  . hydrOXYzine (ATARAX/VISTARIL) 25 MG tablet Take 25 mg by mouth 3 (three) times daily as needed.      Marland Kitchen lisinopril-hydrochlorothiazide (PRINZIDE,ZESTORETIC) 20-12.5 MG per tablet Take 2 tablets by mouth daily.  60 tablet  5  . traZODone (DESYREL) 100 MG tablet Take 100-200 mg by mouth at bedtime.      Marland Kitchen warfarin (COUMADIN) 7.5 MG tablet Take 1& 1/2 tablets on  Days of week except on Wednesday:  Take only 1 tablet every Wednesday.  50 tablet  3  . ziprasidone (GEODON) 60 MG capsule Take 60 mg by mouth 2 (two) times daily  with a meal.        No current facility-administered medications for this visit.   Family History  Problem Relation Age of Onset  . Cancer Mother     breast   History   Social History  . Marital Status: Divorced    Spouse Name: N/A    Number of Children: N/A  . Years of Education: N/A   Social History Main Topics  . Smoking status: Current Every Day Smoker -- 0.25 packs/day for 18 years    Types: Cigarettes  . Smokeless tobacco: Never Used  . Alcohol Use: Yes     Comment: rare  . Drug Use: No  . Sexual Activity: No   Other Topics Concern  . Not on file   Social History Narrative   Financial assistance application initiated. Patient needs to submit further paperwork to complete   [facility]  September 11, 2010 2:04 PM   Financial assistance approved for 100% discount at [facility] and has [facility] card   [name]  October 04, 2010 5:29 PM    Objective:  Physical Exam: Filed Vitals:   01/11/14 1100 01/11/14 1120  BP: 162/107 157/104  Pulse: 72 71  Temp: 97.5 F (36.4 C)   TempSrc: Oral   Weight: 198 lb 4.8 oz (89.948 kg)   SpO2: 99%    Cardiac: RRR, no rubs, murmurs or gallops Pulm: clear to auscultation bilaterally, moving normal volumes of air Abd: soft, nontender, nondistended, BS present Ext: no pedal edema Neuro: alert and oriented X3, cranial nerves II-XII grossly intact  Assessment & Plan:  Case and care discussed with Dr. Stann Mainland.  Please see problem oriented charting for further details. Patient to return in 1 month for HTN follow up.

## 2014-01-11 NOTE — Progress Notes (Signed)
Case discussed with Dr. Sharda soon after the resident saw the patient.  We reviewed the resident's history and exam and pertinent patient test results.  I agree with the assessment, diagnosis, and plan of care documented in the resident's note. 

## 2014-01-12 ENCOUNTER — Ambulatory Visit (INDEPENDENT_AMBULATORY_CARE_PROVIDER_SITE_OTHER): Payer: Medicare Other | Admitting: Nurse Practitioner

## 2014-01-12 ENCOUNTER — Telehealth: Payer: Self-pay | Admitting: *Deleted

## 2014-01-12 ENCOUNTER — Encounter: Payer: Medicare Other | Admitting: Internal Medicine

## 2014-01-12 ENCOUNTER — Encounter: Payer: Self-pay | Admitting: Nurse Practitioner

## 2014-01-12 VITALS — BP 132/70 | HR 68 | Ht 65.0 in | Wt 201.4 lb

## 2014-01-12 DIAGNOSIS — Z1211 Encounter for screening for malignant neoplasm of colon: Secondary | ICD-10-CM | POA: Diagnosis not present

## 2014-01-12 DIAGNOSIS — Z7901 Long term (current) use of anticoagulants: Secondary | ICD-10-CM

## 2014-01-12 MED ORDER — MOVIPREP 100 G PO SOLR: 1.0000 | ORAL | Status: DC

## 2014-01-12 NOTE — Progress Notes (Addendum)
HPI :  This is a 53 year old female referred by Dr. Jenetta Downer for a screening colonoscopy. Patient has multiple medical problems, on multiple medications including Coumadin. No gastrointestinal complaints such as bowel changes, abdominal pain, nausea, or rectal bleeding. She has a history of sigmoid diverticulitis. No family history of colon cancer. Patient has a history of breast cancer several years ago. She is having a bilateral 01/28/14 mastectomy for fibroadenomas of both breast and is to be off Coumadin 5 days preop. Patient would like to arrange for colonoscopy around this time while off Coumadin  Past Medical History  Diagnosis Date  . DVT (deep venous thrombosis)     bilateral, 2 episodes: Requires lifelong therapy  . Chronic pain syndrome   . Restless leg syndrome   . Depression   . Hyperlipidemia   . Hypertension   . PVD (peripheral vascular disease)     s/p L fem-pop bypass 11/21/08; graft occluded 12/28/08;  aortogram w/ bilat LE runoff: 1.  bilat diffuse SFA occlusive dz, 2.  Mod to severe above-knee popliteal dz,  3.  Bilat 3-vessel runoff w/ mild tibial occlusive dz.  . Tobacco abuse   . Degenerative joint disease of knee   . Breast lump 02/2008    Biopsy 05/2008: showed no evidence of malignancy  . Irregular menses 9/08  . Pulmonary edema   . Anxiety   . Joint pain   . Mixed stress and urge urinary incontinence     Being followed by [facility], underwent cystoscopy & uroflowmetry   . Endometrial mass 10/12/2012    Endometrium, biopsy on 11/04/12 - PROLIFERATIVE ENDOMETRIUM AND ABUNDANT MUCUS. NO HYPERPLASIA OR CARCINOMA.   . Sigmoid diverticulitis 10/26/2012  . Asthma   . Breast mass in female     bi lat     Family History  Problem Relation Age of Onset  . Breast cancer Mother    History  Substance Use Topics  . Smoking status: Current Every Day Smoker -- 0.25 packs/day for 18 years    Types: Cigarettes  . Smokeless tobacco: Never Used   Comment: 3 per day  . Alcohol Use: Yes     Comment: rare   Current Outpatient Prescriptions  Medication Sig Dispense Refill  . albuterol (PROAIR HFA) 108 (90 BASE) MCG/ACT inhaler Inhale 2 puffs into the lungs every 6 (six) hours as needed for wheezing. Take 1-2 puffs as needed every 4-6 hours as needed for shortness of breath  1 Inhaler  3  . amLODipine (NORVASC) 10 MG tablet Take 1 tablet (10 mg total) by mouth daily.  30 tablet  11  . aspirin 81 MG tablet Take 1 tablet (81 mg total) by mouth daily.  100 tablet  3  . atorvastatin (LIPITOR) 40 MG tablet Take 1 tablet (40 mg total) by mouth at bedtime.  90 tablet  3  . buPROPion (WELLBUTRIN XL) 150 MG 24 hr tablet Take 150 mg by mouth daily.      . carvedilol (COREG) 25 MG tablet TAKE ONE TABLET BY MOUTH TWICE DAILY  180 tablet  0  . diazepam (VALIUM) 5 MG tablet Take 10 mg by mouth every 8 (eight) hours as needed.       . DULoxetine (CYMBALTA) 60 MG capsule Take 60 mg by mouth. Take 1 capsule in the morning and 1 capsule at bedtime       . gabapentin (NEURONTIN) 800 MG tablet TAKE 1 TABLET BY MOUTH IN THE MORNING, 1  TABLET BY MOUTH MIDDAY, AND 2 TABLETS AT BEDTIME  120 tablet  3  . hydrOXYzine (ATARAX/VISTARIL) 25 MG tablet Take 25 mg by mouth 3 (three) times daily as needed.      Marland Kitchen lisinopril-hydrochlorothiazide (PRINZIDE,ZESTORETIC) 20-12.5 MG per tablet Take 2 tablets by mouth daily.  60 tablet  5  . naproxen (NAPROSYN) 500 MG tablet Take 1 tablet (500 mg total) by mouth 2 (two) times daily with a meal.  60 tablet  0  . traZODone (DESYREL) 100 MG tablet Take 100-200 mg by mouth at bedtime.      Marland Kitchen warfarin (COUMADIN) 7.5 MG tablet Take 1& 1/2 tablets on  Days of week except on Wednesday:  Take only 1 tablet every Wednesday.  50 tablet  3  . ziprasidone (GEODON) 60 MG capsule Take 60 mg by mouth 2 (two) times daily with a meal.       . MOVIPREP 100 G SOLR Take 1 kit (200 g total) by mouth as directed.  1 kit  0   No current  facility-administered medications for this visit.   Allergies  Allergen Reactions  . Propoxyphene N-Acetaminophen Swelling   Review of Systems: Positive for anxiety, arthritis, breast changes, vision changes, depression, fatigue, muscle pains, sleeping problems, swelling of feet and legs and urine leakage. All other systems reviewed and negative except where noted in HPI.   Physical Exam: BP 132/70  Pulse 68  Ht '5\' 5"'  (1.651 m)  Wt 201 lb 6 oz (91.343 kg)  BMI 33.51 kg/m2  LMP 11/23/2013 Constitutional: Pleasant,well-developed, black female in no acute distress. HEENT:  and atraumatic. Conjunctivae are normal. No scleral icterus. Neck supple.  Cardiovascular: Normal rate, regular rhythm.  Pulmonary/chest: Effort normal and breath sounds normal. No wheezing, rales or rhonchi. Abdominal: Soft, nondistended, nontender. Bowel sounds active throughout. There are no masses palpable. No hepatomegaly. Extremities: no edema Lymphadenopathy: No cervical adenopathy noted. Neurological: Alert and oriented to person place and time. Skin: Skin is warm and dry. No rashes noted. Psychiatric: Normal mood and affect. Behavior is normal.   ASSESSMENT AND PLAN:  2. 53 year old female for screening colonoscopy. No gastrointestinal complaints or alarm symptoms. The risks, benefits, and alternatives to colonoscopy with possible biopsy and possible polypectomy were discussed with the patient and she consents to proceed. Patient is on Coumadin, will contact the prescribing provider about holding for procedure.  2. History of DVT, on chronic anticoagulation. She is scheduled for bilateral mastectomy at the end of the month. She is to hold Coumadin 5 days preop. Patient trying to arrange for colonoscopy to be done while off Coumadin for mastectomy. We have scheduled the colonoscopy for 01/25/14 which will require her to be off Coumadin for a longer duration of time. If it is too risky for patient to be off  Coumadin for this length of time then we can reschedule her screening colonoscopy for a later date. Will wait to hear from prescribing provider.  3. multiple medical problems including, but not limited to peripheral vascular disease, HTN, history of breast cancer, chronic pain syndrome and anxiety.  Addendum: Reviewed and agree with initial management. Jerene Bears, MD

## 2014-01-12 NOTE — Patient Instructions (Addendum)
You have been given a separate informational sheet regarding your tobacco use, the importance of quitting and local resources to help you quit. You have been scheduled for a colonoscopy with propofol. Please follow written instructions given to you at your visit today.  Please pick up your prep kit at the pharmacy within the next 1-3 days. [facility] [address].

## 2014-01-12 NOTE — Telephone Encounter (Signed)
  01/12/2014   RE: CAILEIGH CANCHE DOB: Apr 25, 1961 MRN: [id number]   Dear Alphonsa Gin III,    We have scheduled the above patient for an endoscopic procedure. Our records show that she is on anticoagulation therapy.   Please advise as to how long the patient may come off her therapy of Coumadin prior to the procedure, which is scheduled for 01-25-2014. We are aware of her upcoming breast surgery on 01-28-2014. She will have been off coumadin 5 days prior to that date.  However, we would need to extend the discontinuation of the coumadin for a total of 8 days.  Please advise with her issue of DVT's is this acceptable without risk?    Please fax back/ or route the completed form to [place] at [phone].   Sincerely,    Tye Savoy NP-C     [place]

## 2014-01-13 ENCOUNTER — Encounter: Payer: Self-pay | Admitting: Pharmacist

## 2014-01-13 ENCOUNTER — Encounter: Payer: Self-pay | Admitting: Nurse Practitioner

## 2014-01-13 ENCOUNTER — Telehealth: Payer: Self-pay | Admitting: Pharmacist

## 2014-01-13 NOTE — Telephone Encounter (Signed)
Have been asked to consult upon patient Alisha [name]. Delap (date of birth 1961/10/04) who is managed within the [facility] on continued warfarin as a result of a remote history of venous thromboembolism (VTE) documented in [facility] 04/09/2011 5:39 PM by Burman Freestone, MD as:  DVT in both legs, 2nd event. For that reason she remains upon continued secondary prophylaxis against recurrence of VTE.  Patient has upcoming surgery scheduled for 30-JAN-15 (breast surgery) for which she has apparently been advised by her general surgeon to discontinue warfarin 5 days prior to this surgical event. Have been asked by [facility] to comment upon possible expanded time off warfarin so as to facilitate an elective age-appropriate screening colonoscopy that is scheduled for 27-JAN-15. Planned discontinuation for warfarin around this procedure would similarly require discontinuation of warfarin 5 days prior to the planned colonoscopy, i.e. discontinuation of warfarin on 22-JAN-15 for a total duration of discontinued warfarin equaling 8 days prior to her planned breast surgery. In patients who are having a major surgical or other invasive procedure (colonoscopy), interruption of antithrombotic therapy is typically required to minimize the risk of perioperative bleeding.  Continuation of warfarin therapy in the immediate perioperative period confers an increased risk of bleeding. A second issue is:  If antithrombotic therapy is interrupted before surgery, "is bridging anticoagulation" needed? The need for bridging is driven by patients' estimated risk for VTE. In LOW RISK patients, the need to prevent VTE will be less dominant and bridging may be avoided.  Her assessment for bleeding based upon CHEST stratification table indicates she is HIGH RISK FOR BLEEDING-should there be a requirement by GI Medicine to perform colonic polyp resection, especially  for > 1-2cm sessile polyps (for which bleeding may occur at transected stalk after hemostatic plug release).  In resuming treatment after surgery, it will take 2-3 days for anticoagulant effect to begin after recommencing warfarin therapy. Suggestion in CHEST is for warfarin to be recommenced 24 - 72 hours after her surgery. Immediately postoperatively, placement of non-pharmacologic prophylaxis with intermittent pneumatic compression device bilaterally will mitigate against VTE while awaiting effect of warfarin.    Since most post-procedure VTE events are cited in the literature to occur in the initial 2 weeks after surgery, careful attention to post-operative appropriately timed (relative to her surgery of 30-JAN-15) recommencement of warfarin therapy will be warranted. Close patient follow-up during her early postop period will hopefully allow an early detection and treatment of any complications. We should plan to see her within her first post-operative discharge week within our anticoagulation management clinic.

## 2014-01-14 ENCOUNTER — Telehealth: Payer: Self-pay | Admitting: *Deleted

## 2014-01-14 NOTE — Telephone Encounter (Signed)
I called Dr. Pennie Banter [facility] to discuss this patient and her coumadin clearance for the colonoscopy scheduled for 01-25-2014 . I advised she was already approved to come off the coumadin 5 days before her breast surgery scheduled for 01-28-2014.  We asked if we could stop her coumadin 3 days prior tot he 27th ( colonoscopy date) making the days she is off coumadin a total of 8 days.  He verbally said she could hold the coumadin for the 8 days and resume it the day after her breast surgery. I asked him if he could respond to a phone note with a anti-coagulation letter attached.  He said he really didn't know how to do that and asked me to fax the letter.  I faxed it to [phone].

## 2014-01-14 NOTE — Telephone Encounter (Signed)
I spoke to Lyons B. Groce III  RPH and he said that he tried to fax me 3 times yesterday and kept getting a busy signal.  He told me he did the instructions and inbedded it in an encounter.  I found it and sent a copy down to be scanned . Patient also notified.

## 2014-01-21 ENCOUNTER — Ambulatory Visit (INDEPENDENT_AMBULATORY_CARE_PROVIDER_SITE_OTHER): Payer: Medicare Other | Admitting: Pharmacist

## 2014-01-21 DIAGNOSIS — Z7901 Long term (current) use of anticoagulants: Secondary | ICD-10-CM | POA: Diagnosis not present

## 2014-01-21 DIAGNOSIS — I82409 Acute embolism and thrombosis of unspecified deep veins of unspecified lower extremity: Secondary | ICD-10-CM | POA: Diagnosis not present

## 2014-01-21 LAB — POCT INR
INR: 1.4
INR: 1.4

## 2014-01-21 NOTE — Progress Notes (Signed)
Anti-Coagulation Progress Note  SABREEN KITCHEN is a 53 y.o. female who is currently on an anti-coagulation regimen.    RECENT RESULTS: Recent results are below, the most recent result is correlated with a dose of 30 mg over past 5 days. She has now DISCONTINUED her warfarin as was earlier instructed in lieu of pending colonoscopy to be performed on 27-JAN-15. GI Medicine had stipulated 5 days OFF warfarin. She will continue "OFF" warfarin after colonoscopy planned for 27-JAN-15 leading up unto her planned surgery for 30-JAN-15. After that date, as advised by surgery, she will recommence warfarin using her 7.5mg  strength warfarin tablets for which I have instructed her to take 1 tablet (7.5mg ) warfarin PO once daily until seen by me--suggested to be within first week of her last surgery. She understands these instructions which were provided to her.  Lab Results  Component Value Date   INR 1.40 01/21/2014   INR 1.4 01/21/2014   INR 2.2 01/07/2014    ANTI-COAG DOSE: Anticoagulation Dose Instructions as of 01/21/2014     Dorene Grebe Tue Wed Thu Fri Sat   New Dose 7.5 mg 3.75 mg 7.5 mg 7.5 mg 3.75 mg 7.5 mg 3.75 mg       ANTICOAG SUMMARY: Anticoagulation Episode Summary   Current INR goal 2.0-3.0  Next INR check 02/07/2014  INR from last check 2.2 (01/07/2014)  Most recent INR 1.40! (01/21/2014)  Weekly max dose   Target end date   [facility]  Preferred lab   Send INR reminders to ANTICOAG IMP   Indications  DVT [453.40] Long term current use of anticoagulant [V58.61]        Comments       Anticoagulation Care Providers   Provider Role Specialty Phone number   Burman Freestone, MD  Internal Medicine [phone]      ANTICOAG TODAY: Anticoagulation Summary as of 01/21/2014   INR goal 2.0-3.0  Selected INR   Next INR check 02/07/2014  Target end date    Indications  DVT [453.40] Long term current use of anticoagulant [V58.61]      Anticoagulation Episode  Summary   INR check location [facility]   Preferred lab    Send INR reminders to ANTICOAG IMP   Comments     Anticoagulation Care Providers   Provider Role Specialty Phone number   Burman Freestone, MD  Internal Medicine [phone]      PATIENT INSTRUCTIONS: Patient Instructions  Patient instructed to take medications as defined in the Anti-coagulation Track section of this encounter.  Patient instructed to OMIT/HOLD warfarin as was instructed that began on 22-JAN-15 until her surgery on 30-JAN-15. f  Patient verbalized understanding of these instructions.       FOLLOW-UP Return in 2 weeks (on 02/07/2014) for Follow up INR at 1015h.  Jorene Guest, III Pharm.D., CACP

## 2014-01-21 NOTE — Patient Instructions (Signed)
Patient instructed to take medications as defined in the Anti-coagulation Track section of this encounter.  Patient instructed to OMIT/HOLD warfarin as was instructed that began on 22-JAN-15 until her surgery on 30-JAN-15. f  Patient verbalized understanding of these instructions.

## 2014-01-24 ENCOUNTER — Encounter (HOSPITAL_BASED_OUTPATIENT_CLINIC_OR_DEPARTMENT_OTHER): Payer: Self-pay | Admitting: *Deleted

## 2014-01-24 NOTE — Progress Notes (Signed)
To come in for cbc cmet per dr cornett-and ekg-on coumadin for dvt-off for colonoscopy and surgery-pt-inr 01/21/14-1.4

## 2014-01-25 ENCOUNTER — Ambulatory Visit (AMBULATORY_SURGERY_CENTER): Payer: Medicare Other | Admitting: Internal Medicine

## 2014-01-25 ENCOUNTER — Encounter: Payer: Self-pay | Admitting: Internal Medicine

## 2014-01-25 VITALS — BP 191/106 | HR 60 | Temp 96.9°F | Resp 17 | Ht 65.0 in | Wt 201.0 lb

## 2014-01-25 DIAGNOSIS — I739 Peripheral vascular disease, unspecified: Secondary | ICD-10-CM | POA: Diagnosis not present

## 2014-01-25 DIAGNOSIS — D126 Benign neoplasm of colon, unspecified: Secondary | ICD-10-CM | POA: Diagnosis not present

## 2014-01-25 DIAGNOSIS — G2581 Restless legs syndrome: Secondary | ICD-10-CM | POA: Diagnosis not present

## 2014-01-25 DIAGNOSIS — Z1211 Encounter for screening for malignant neoplasm of colon: Secondary | ICD-10-CM | POA: Diagnosis not present

## 2014-01-25 DIAGNOSIS — I10 Essential (primary) hypertension: Secondary | ICD-10-CM | POA: Diagnosis not present

## 2014-01-25 HISTORY — PX: COLONOSCOPY WITH PROPOFOL: SHX5780

## 2014-01-25 MED ORDER — SODIUM CHLORIDE 0.9 % IV SOLN: 500.0000 mL | INTRAVENOUS | Status: DC

## 2014-01-25 NOTE — Progress Notes (Signed)
Lidocaine-40mg IV prior to Propofol InductionPropofol given over incremental dosages 

## 2014-01-25 NOTE — Op Note (Signed)
[address]  [address] [place], [address]   COLONOSCOPY PROCEDURE REPORT  PATIENT: Alisha [name], Alisha [name]  MR#: [id number] BIRTHDATE: [birth year] , 52  yrs. old GENDER: Female ENDOSCOPIST: Jerene Bears, MD PROCEDURE DATE:  01/25/2014 PROCEDURE:   Colonoscopy with snare polypectomy First Screening Colonoscopy - Avg.  risk and is 50 yrs.  old or older Yes.  Prior Negative Screening - Now for repeat screening. N/A  History of Adenoma - Now for follow-up colonoscopy & has been > or = to 3 yrs.  N/A  Polyps Removed Today? Yes. ASA CLASS:   Class III INDICATIONS:average risk screening and first colonoscopy. MEDICATIONS: MAC sedation, administered by CRNA and propofol (Diprivan) 300mg  IV  DESCRIPTION OF PROCEDURE:   After the risks benefits and alternatives of the procedure were thoroughly explained, informed consent was obtained.  A digital rectal exam revealed no rectal mass.   The LB [id number]  endoscope was introduced through the anus and advanced to the cecum, which was identified by both the appendix and ileocecal valve. No adverse events experienced. The quality of the prep was Moviprep fair requiring copious irrigation and lavage. The instrument was then slowly withdrawn as the colon was fully examined.   COLON FINDINGS: Four sessile polyps measuring 4-5 mm in size were found in the rectosigmoid colon.  Polypectomy was performed using cold snare.  All resections were complete and all polyp tissue was completely retrieved.   There was moderate diverticulosis noted in the ascending colon, descending colon, and sigmoid colon with associated muscular hypertrophy.  Retroflexion was not performed due to a narrow rectal vault. The time to cecum=5 minutes 23 seconds.  Withdrawal time=18 minutes 50 seconds.  The scope was withdrawn and the procedure completed. COMPLICATIONS: There were no complications.  ENDOSCOPIC IMPRESSION: 1.   Four sessile polyps measuring  4-5 mm in size were found in the rectosigmoid colon; Polypectomy was performed using cold snare 2.   There was moderate diverticulosis noted in the ascending colon, descending colon, and sigmoid colon  RECOMMENDATIONS: 1.  Await pathology results 2.  High fiber diet 3.  Timing of repeat colonoscopy will be determined by pathology findings. 4.  You will receive a letter within 1-2 weeks with the results of your biopsy as well as final recommendations.  Please call my office if you have not received a letter after 3 weeks.   eSigned:  Jerene Bears, MD 01/25/2014 9:16 AM cc: The Patient; Fransisca Kaufmann, MD

## 2014-01-25 NOTE — Progress Notes (Addendum)
Patient is crying.  States that she had to fill out all of the forms for disability and her surgery for Friday.  She states that she has an aide to help her, but he doesn't help with paperwork.  I will talk to Dr. Hilarie Fredrickson regarding a Education officer, museum.   Patient does have a Education officer, museum.   Patient is asking for her CNA to come back.  She is HIPPA, so he did not.

## 2014-01-25 NOTE — Progress Notes (Signed)
Called to room to assist during endoscopic procedure.  Patient ID and intended procedure confirmed with present staff. Received instructions for my participation in the procedure from the performing physician.  

## 2014-01-25 NOTE — Patient Instructions (Signed)
YOU HAD AN ENDOSCOPIC PROCEDURE TODAY AT THE [facility]: Refer to the procedure report that was given to you for any specific questions about what was found during the examination.  If the procedure report does not answer your questions, please call your gastroenterologist to clarify.  If you requested that your care partner not be given the details of your procedure findings, then the procedure report has been included in a sealed envelope for you to review at your convenience later.  YOU SHOULD EXPECT: Some feelings of bloating in the abdomen. Passage of more gas than usual.  Walking can help get rid of the air that was put into your GI tract during the procedure and reduce the bloating. If you had a lower endoscopy (such as a colonoscopy or flexible sigmoidoscopy) you may notice spotting of blood in your stool or on the toilet paper. If you underwent a bowel prep for your procedure, then you may not have a normal bowel movement for a few days.  DIET: Your first meal following the procedure should be a light meal and then it is ok to progress to your normal diet.  A half-sandwich or bowl of soup is an example of a good first meal.  Heavy or fried foods are harder to digest and may make you feel nauseous or bloated.  Likewise meals heavy in dairy and vegetables can cause extra gas to form and this can also increase the bloating.  Drink plenty of fluids but you should avoid alcoholic beverages for 24 hours.  ACTIVITY: Your care partner should take you home directly after the procedure.  You should plan to take it easy, moving slowly for the rest of the day.  You can resume normal activity the day after the procedure however you should NOT DRIVE or use heavy machinery for 24 hours (because of the sedation medicines used during the test).    SYMPTOMS TO REPORT IMMEDIATELY: A gastroenterologist can be reached at any hour.  During normal business hours, 8:30 AM to 5:00 PM Monday through Friday,  call [phone].  After hours and on weekends, please call the GI answering service at [phone] who will take a message and have the physician on call contact you.   Following lower endoscopy (colonoscopy or flexible sigmoidoscopy):  Excessive amounts of blood in the stool  Significant tenderness or worsening of abdominal pains  Swelling of the abdomen that is new, acute  Fever of 100F or higher  FOLLOW UP: If any biopsies were taken you will be contacted by phone or by letter within the next 1-3 weeks.  Call your gastroenterologist if you have not heard about the biopsies in 3 weeks.  Our staff will call the home number listed on your records the next business day following your procedure to check on you and address any questions or concerns that you may have at that time regarding the information given to you following your procedure. This is a courtesy call and so if there is no answer at the home number and we have not heard from you through the emergency physician on call, we will assume that you have returned to your regular daily activities without incident.  SIGNATURES/CONFIDENTIALITY: You and/or your care partner have signed paperwork which will be entered into your electronic medical record.  These signatures attest to the fact that that the information above on your After Visit Summary has been reviewed and is understood.  Full responsibility of the confidentiality of this   discharge information lies with you and/or your care-partner.   DOD  NOT TAKE YOUR COUMADIN

## 2014-01-25 NOTE — Progress Notes (Signed)
NO EGG OR SOY ALLERGY. EMW NO PROBLEMS WITH PAST SEDATION. EWM

## 2014-01-26 ENCOUNTER — Telehealth: Payer: Self-pay

## 2014-01-26 NOTE — Telephone Encounter (Signed)
  Follow up Call-  Call back number 01/25/2014 01/25/2014  Post procedure Call Back phone  # OR [phone]  Permission to leave phone message - Yes     Patient questions:  Do you have a fever, pain , or abdominal swelling? no Pain Score  0 *  Have you tolerated food without any problems? yes  Have you been able to return to your normal activities? yes  Do you have any questions about your discharge instructions: Diet   no Medications  no Follow up visit  no  Do you have questions or concerns about your Care? no  Actions: * If pain score is 4 or above: No action needed, pain <4.

## 2014-01-27 ENCOUNTER — Encounter (HOSPITAL_BASED_OUTPATIENT_CLINIC_OR_DEPARTMENT_OTHER)
Admission: RE | Admit: 2014-01-27 | Discharge: 2014-01-27 | Disposition: A | Discharge status: Home / Self Care | Payer: Medicare Other | Source: Ambulatory Visit | Attending: Surgery | Admitting: Surgery

## 2014-01-27 ENCOUNTER — Telehealth (INDEPENDENT_AMBULATORY_CARE_PROVIDER_SITE_OTHER): Payer: Self-pay

## 2014-01-27 ENCOUNTER — Telehealth: Payer: Self-pay | Admitting: *Deleted

## 2014-01-27 ENCOUNTER — Other Ambulatory Visit: Payer: Self-pay | Admitting: Internal Medicine

## 2014-01-27 ENCOUNTER — Encounter (HOSPITAL_BASED_OUTPATIENT_CLINIC_OR_DEPARTMENT_OTHER): Payer: Medicare Other | Admitting: Anesthesiology

## 2014-01-27 ENCOUNTER — Encounter (HOSPITAL_BASED_OUTPATIENT_CLINIC_OR_DEPARTMENT_OTHER): Payer: Self-pay | Admitting: Anesthesiology

## 2014-01-27 DIAGNOSIS — Z01810 Encounter for preprocedural cardiovascular examination: Secondary | ICD-10-CM

## 2014-01-27 DIAGNOSIS — Z01812 Encounter for preprocedural laboratory examination: Secondary | ICD-10-CM | POA: Insufficient documentation

## 2014-01-27 DIAGNOSIS — Z01818 Encounter for other preprocedural examination: Secondary | ICD-10-CM | POA: Diagnosis not present

## 2014-01-27 LAB — CBC WITH DIFFERENTIAL/PLATELET
Basophils Absolute: 0 10*3/uL (ref 0.0–0.1)
Basophils Relative: 0 % (ref 0–1)
EOS PCT: 2 % (ref 0–5)
Eosinophils Absolute: 0.1 10*3/uL (ref 0.0–0.7)
HCT: 46 % (ref 36.0–46.0)
Hemoglobin: 16.2 g/dL — ABNORMAL HIGH (ref 12.0–15.0)
LYMPHS ABS: 2.9 10*3/uL (ref 0.7–4.0)
LYMPHS PCT: 33 % (ref 12–46)
MCH: 33.9 pg (ref 26.0–34.0)
MCHC: 35.2 g/dL (ref 30.0–36.0)
MCV: 96.2 fL (ref 78.0–100.0)
Monocytes Absolute: 0.5 10*3/uL (ref 0.1–1.0)
Monocytes Relative: 5 % (ref 3–12)
NEUTROS PCT: 61 % (ref 43–77)
Neutro Abs: 5.4 10*3/uL (ref 1.7–7.7)
Platelets: 203 10*3/uL (ref 150–400)
RBC: 4.78 MIL/uL (ref 3.87–5.11)
RDW: 12.7 % (ref 11.5–15.5)
WBC: 9 10*3/uL (ref 4.0–10.5)

## 2014-01-27 LAB — COMPREHENSIVE METABOLIC PANEL
ALK PHOS: 76 U/L (ref 39–117)
ALT: 20 U/L (ref 0–35)
AST: 16 U/L (ref 0–37)
Albumin: 4.4 g/dL (ref 3.5–5.2)
BILIRUBIN TOTAL: 0.3 mg/dL (ref 0.3–1.2)
BUN: 9 mg/dL (ref 6–23)
CHLORIDE: 107 meq/L (ref 96–112)
CO2: 27 meq/L (ref 19–32)
Calcium: 10 mg/dL (ref 8.4–10.5)
Creatinine, Ser: 0.93 mg/dL (ref 0.50–1.10)
GFR calc non Af Amer: 70 mL/min — ABNORMAL LOW (ref >90)
GFR, EST AFRICAN AMERICAN: 81 mL/min — AB (ref >90)
GLUCOSE: 67 mg/dL — AB (ref 70–99)
Potassium: 4.3 mEq/L (ref 3.7–5.3)
Sodium: 149 mEq/L — ABNORMAL HIGH (ref 137–147)
Total Protein: 8.3 g/dL (ref 6.0–8.3)

## 2014-01-27 NOTE — Progress Notes (Signed)
ekg reviewed by Dr Al Corpus. Changes from last ekg.  Dr Al Corpus wants cardiology to clear pt before pt has surgery.  Called [facility],  Spoke with Val Eagle She will speak with dr Burnard Bunting who will arrange for cardiac consult. I spoke with Sharyn Lull at [facility] will advise Dr Brantley Stage of cancelled surg.  I spoke with OR and Front desk to cancel surgery for 01-28-14

## 2014-01-27 NOTE — Telephone Encounter (Signed)
Call from Cindi at [facility] - # [phone] Pt was having surgical screening done and noted to have EKG changes.   They are asking for Clearance for surgery  from a Cardiologist.  Pt does not have a Cardiologist so if we could refer ASAP. CBC and CMET were done today. Surgery is for Lumpectomy with Bilateral Needle localization.  Dr. Malachi Paradise @ [facility]

## 2014-01-27 NOTE — Progress Notes (Signed)
Cardiology referral placed for pre-op clearance given history of PVD and new lateral lead T wave inversions on pre op EKG on 01/27/14.

## 2014-01-27 NOTE — Telephone Encounter (Signed)
Alisha [name] from [facility] called stating surgery for tomorrow is cancelled. She needs cardiac clearance. Pt is aware and her PCP is arranging appt with cardiology. Colletta Maryland in surgery scheduling is aware.

## 2014-01-27 NOTE — Telephone Encounter (Signed)
CALLED AND SPOKE Alisha [name], GAVE HER APPOINTMENT INFORMATION FOR [facility]  1-30-015 @ 3:00PM   [place] /  [phone].  PATIENT WAS ABLE TO REPEAT THIS INFORMATION. Kwame Ryland NTII 1-28-015  5:46PM

## 2014-01-28 ENCOUNTER — Ambulatory Visit (HOSPITAL_BASED_OUTPATIENT_CLINIC_OR_DEPARTMENT_OTHER): Admission: RE | Admit: 2014-01-28 | Payer: Medicare Other | Source: Ambulatory Visit | Admitting: Surgery

## 2014-01-28 ENCOUNTER — Ambulatory Visit (INDEPENDENT_AMBULATORY_CARE_PROVIDER_SITE_OTHER): Payer: Medicare Other | Admitting: Cardiovascular Disease

## 2014-01-28 ENCOUNTER — Encounter: Payer: Self-pay | Admitting: Cardiovascular Disease

## 2014-01-28 VITALS — BP 128/100 | HR 68 | Ht 65.0 in | Wt 193.1 lb

## 2014-01-28 DIAGNOSIS — R9431 Abnormal electrocardiogram [ECG] [EKG]: Secondary | ICD-10-CM | POA: Diagnosis not present

## 2014-01-28 SURGERY — BREAST LUMPECTOMY WITH NEEDLE LOCALIZATION
Anesthesia: General | Laterality: Bilateral

## 2014-01-28 NOTE — Progress Notes (Signed)
Alisha [name]  [birth year]       Alisha [name]    [employer] [address], [address] [name], [name], Al[place] [name]  [address]   Alisha [name], Alisha [name]  [address] [place]   Fax  [phone]    Fax [phone]  Problem List: 1. Abnormal EKG-T-wave inversions in anterolateral leads 2. Peripheral vascular disease 3. History of deep vein thrombosis 3. Depression 4. Anxiety 5. Hypertension 6. History of breast mass-status post biopsy in 2009 which showed no evidence of malignancy  History of Present Illness:  Alisha [name] is a 53 yo female who is referred here by the surgeons for a preoperative visit. She has several risk factors of coronary artery disease including cigarette smoking and peripheral vascular disease. She was noted to have EKG changes on her preop EKG. She was sent here for further evaluation.  She is very sleepy and had a very difficult time staying awake in the office.  Current Outpatient Prescriptions on File Prior to Visit  Medication Sig Dispense Refill  . albuterol (PROAIR HFA) 108 (90 BASE) MCG/ACT inhaler Inhale 2 puffs into the lungs every 6 (six) hours as needed for wheezing. Take 1-2 puffs as needed every 4-6 hours as needed for shortness of breath  1 Inhaler  3  . amLODipine (NORVASC) 10 MG tablet Take 1 tablet (10 mg total) by mouth daily.  30 tablet  11  . aspirin 81 MG tablet Take 1 tablet (81 mg total) by mouth daily.  100 tablet  3  . atorvastatin (LIPITOR) 40 MG tablet Take 1 tablet (40 mg total) by mouth at bedtime.  90 tablet  3  . buPROPion (WELLBUTRIN XL) 150 MG 24 hr tablet Take 150 mg by mouth daily.      . carvedilol (COREG) 25 MG tablet TAKE ONE TABLET BY MOUTH TWICE DAILY  180 tablet  0  . diazepam (VALIUM) 5 MG tablet Take 10 mg by mouth every 8 (eight) hours as needed.       . DULoxetine (CYMBALTA) 60 MG capsule Take 60 mg by mouth. Take 1 capsule in the morning and 1 capsule at  bedtime       . gabapentin (NEURONTIN) 800 MG tablet TAKE 1 TABLET BY MOUTH IN THE MORNING, 1 TABLET BY MOUTH MIDDAY, AND 2 TABLETS AT BEDTIME  120 tablet  3  . hydrOXYzine (ATARAX/VISTARIL) 25 MG tablet Take 25 mg by mouth 3 (three) times daily as needed.      Marland Kitchen lisinopril-hydrochlorothiazide (PRINZIDE,ZESTORETIC) 20-12.5 MG per tablet Take 2 tablets by mouth daily.  60 tablet  5  . naproxen (NAPROSYN) 500 MG tablet Take 1 tablet (500 mg total) by mouth 2 (two) times daily with a meal.  60 tablet  0  . traZODone (DESYREL) 100 MG tablet Take 100-200 mg by mouth at bedtime.      Marland Kitchen warfarin (COUMADIN) 7.5 MG tablet Take 1& 1/2 tablets on  Days of week except on Wednesday:  Take only 1 tablet every Wednesday.  50 tablet  3  . ziprasidone (GEODON) 60 MG capsule Take 60 mg by mouth 2 (two) times daily with a meal.        No current facility-administered medications on file prior to visit.    Allergies  Allergen Reactions  . Propoxyphene N-Acetaminophen Swelling    Past Medical History  Diagnosis Date  . DVT (deep venous thrombosis)  bilateral, 2 episodes: Requires lifelong therapy  . Chronic pain syndrome   . Restless leg syndrome   . Depression   . Hyperlipidemia   . Hypertension   . PVD (peripheral vascular disease)     s/p L fem-pop bypass 11/21/08; graft occluded 12/28/08;  aortogram w/ bilat LE runoff: 1.  bilat diffuse SFA occlusive dz, 2.  Mod to severe above-knee popliteal dz,  3.  Bilat 3-vessel runoff w/ mild tibial occlusive dz.  . Tobacco abuse   . Degenerative joint disease of knee   . Breast lump 02/2008    Biopsy 05/2008: showed no evidence of malignancy  . Irregular menses 9/08  . Pulmonary edema   . Anxiety   . Joint pain   . Mixed stress and urge urinary incontinence     [facility], underwent cystoscopy & uroflowmetry   . Endometrial mass 10/12/2012    Endometrium, biopsy on 11/04/12 - PROLIFERATIVE ENDOMETRIUM AND ABUNDANT MUCUS. NO  HYPERPLASIA OR CARCINOMA.   . Sigmoid diverticulitis 10/26/2012  . Asthma   . Breast mass in female     bi lat     Past Surgical History  Procedure Laterality Date  . Femoral bypass  11/09  .  right external iliac artery stent    . Right breast needle-localized lumpectomy    . Pr vein bypass graft,aorto-fem-pop    . Carotid endarterectomy    . Tubal ligation    . Tonsillectomy      History  Smoking status  . Current Every Day Smoker -- 0.25 packs/day for 18 years  . Types: Cigarettes  Smokeless tobacco  . Never Used    Comment: 3-6 per day    History  Alcohol Use  . Yes    Comment: rare    Family History  Problem Relation Age of Onset  . Breast cancer Mother   . Colon cancer Neg Hx   . Rectal cancer Neg Hx   . Stomach cancer Neg Hx   . Breast cancer Other     Alisha [name]    Alisha [name]:  Reviewed in the HPI.  All other [name] are negative.  Physical Exam: Blood pressure 128/100, pulse 68, height 5\' 5"  (1.651 m), weight 193 lb 1.9 oz (87.599 kg), last menstrual period 11/23/2013. Wt Readings from Last 3 Encounters:  01/28/14 193 lb 1.9 oz (87.599 kg)  01/24/14 200 lb (90.719 kg)  01/25/14 201 lb (91.173 kg)     General: patient is very lethargic.   It was difficult to keep her awake during the interview and exam.  Head: Normocephalic, atraumatic, sclera non-icteric, mucus membranes are moist,   Neck: Supple. Carotids are 2 + without bruits. No JVD   Lungs: Clear   Heart: RR  Abdomen: Soft, non-tender, non-distended with normal bowel sounds.  Msk:  Strength and tone are normal   Extremities: No clubbing or cyanosis. No edema.     Neuro: CN II - XII intact.  Very lethergic  Psych:  lethargic  ECG: Jan. 30, 2015: Normal sinus rhythm at 60 beats a minute. She has T-wave abnormalities in anterolateral leads consistent with ischemia.  Assessment / Plan:

## 2014-01-28 NOTE — Assessment & Plan Note (Signed)
Alisha [name] presents today for preoperative visit prior to her breast surgery. His son have an abnormal EKG.  She has anterolateral T-wave inversions that actually are very consistent with ischemic heart disease. She has known peripheral vascular disease. She's continued to smoke. She does not have any episodes of chest pain.  We will need to get a Lexiscan Myoview study.    I recommended that she stop her smoking. Her blood pressure is elevated today but she eats lots of salty foods including hot dogs and other breakfast meats very often. I given her instructions on reducing her salt intake. She will followup with her medical doctor for her hyperlipidemia.  If the Myoview study is normal then I'll see her back on an as-needed basis. If she has significant abnormalities that we will need to schedule her for cardiac cath.

## 2014-01-28 NOTE — Patient Instructions (Signed)
Your physician has requested that you have a lexiscan myoview.  Please follow instruction sheet, as given.  Your physician recommends that you schedule a follow-up appointment in: AS NEEDED DEPENDING ON RESULTS  REDUCE HIGH SODIUM FOODS LIKE CANNED SOUP, GRAVY, SAUCES, READY PREPARED FOODS Oto; LEAN CUISINE, LASAGNA. BACON, SAUSAGE, LUNCH MEAT, FAST FOODS, HOT DOGS, CHIPS, PIZZA, CHINESE FOOD, SOY SAUCE, STORE BOUGHT FRIED CHICKEN= [place] FRIED CHICKEN/ BOJANGLES.

## 2014-01-31 ENCOUNTER — Ambulatory Visit (INDEPENDENT_AMBULATORY_CARE_PROVIDER_SITE_OTHER): Payer: Medicare Other | Admitting: Pharmacist

## 2014-01-31 DIAGNOSIS — I82409 Acute embolism and thrombosis of unspecified deep veins of unspecified lower extremity: Secondary | ICD-10-CM | POA: Diagnosis not present

## 2014-01-31 DIAGNOSIS — Z7901 Long term (current) use of anticoagulants: Secondary | ICD-10-CM

## 2014-01-31 LAB — POCT INR: INR: 1

## 2014-01-31 NOTE — Patient Instructions (Signed)
Patient instructed to take medications as defined in the Anti-coagulation Track section of this encounter.  Patient instructed to take today's dose.  Patient verbalized understanding of these instructions.    

## 2014-01-31 NOTE — Progress Notes (Signed)
Anti-Coagulation Progress Note  Alisha [name] is a 53 y.o. female who is currently on an anti-coagulation regimen.    RECENT RESULTS: Recent results are below, the most recent result is correlated with a dose of ZERO mg. per week: Lab Results  Component Value Date   INR 1.0 01/31/2014   INR 1.40 01/21/2014   INR 1.4 01/21/2014    ANTI-COAG DOSE: Anticoagulation Dose Instructions as of 01/31/2014     Dorene Grebe Tue Wed Thu Fri Sat   New Dose 7.5 mg 3.75 mg 7.5 mg 7.5 mg 3.75 mg 7.5 mg 3.75 mg       ANTICOAG SUMMARY: Anticoagulation Episode Summary   Current INR goal 2.0-3.0  Next INR check 02/21/2014  INR from last check 1.0! (01/31/2014)  Weekly max dose   Target end date   [facility] check [facility]  Preferred lab   Send INR reminders to ANTICOAG IMP   Indications  DVT [453.40] Long term current use of anticoagulant [V58.61]        Comments       Anticoagulation Care Providers   Provider Role Specialty Phone number   Burman Freestone, MD  Internal Medicine [phone]      ANTICOAG TODAY: Anticoagulation Summary as of 01/31/2014   INR goal 2.0-3.0  Selected INR 1.0! (01/31/2014)  Next INR check 02/21/2014  Target end date    Indications  DVT [453.40] Long term current use of anticoagulant [V58.61]      Anticoagulation Episode Summary   [facility] check [facility]   Preferred lab    Send INR reminders to ANTICOAG IMP   Comments     Anticoagulation Care Providers   Provider Role Specialty Phone number   Burman Freestone, MD  Internal Medicine [phone]      PATIENT INSTRUCTIONS: Patient Instructions  Patient instructed to take medications as defined in the Anti-coagulation Track section of this encounter.  Patient instructed to take today's dose.  Patient verbalized understanding of these instructions.       FOLLOW-UP Return in 3 weeks (on 02/21/2014) for Follow up INR at 1045h.  Jorene Guest, III Pharm.D., CACP

## 2014-02-01 ENCOUNTER — Encounter: Payer: Self-pay | Admitting: Internal Medicine

## 2014-02-03 ENCOUNTER — Ambulatory Visit: Payer: Medicare Other | Admitting: Internal Medicine

## 2014-02-07 ENCOUNTER — Ambulatory Visit: Payer: Medicare Other

## 2014-02-07 ENCOUNTER — Encounter (INDEPENDENT_AMBULATORY_CARE_PROVIDER_SITE_OTHER): Payer: Medicare Other | Admitting: Surgery

## 2014-02-09 ENCOUNTER — Ambulatory Visit (HOSPITAL_COMMUNITY): Payer: Medicare Other | Attending: Cardiology | Admitting: Radiology

## 2014-02-09 ENCOUNTER — Encounter: Payer: Self-pay | Admitting: Cardiology

## 2014-02-09 VITALS — BP 150/90 | HR 67 | Ht 65.0 in | Wt 199.0 lb

## 2014-02-09 DIAGNOSIS — I739 Peripheral vascular disease, unspecified: Secondary | ICD-10-CM | POA: Diagnosis not present

## 2014-02-09 DIAGNOSIS — F17200 Nicotine dependence, unspecified, uncomplicated: Secondary | ICD-10-CM | POA: Diagnosis not present

## 2014-02-09 DIAGNOSIS — I779 Disorder of arteries and arterioles, unspecified: Secondary | ICD-10-CM | POA: Insufficient documentation

## 2014-02-09 DIAGNOSIS — I10 Essential (primary) hypertension: Secondary | ICD-10-CM | POA: Diagnosis not present

## 2014-02-09 DIAGNOSIS — Z01810 Encounter for preprocedural cardiovascular examination: Secondary | ICD-10-CM

## 2014-02-09 DIAGNOSIS — I252 Old myocardial infarction: Secondary | ICD-10-CM | POA: Diagnosis not present

## 2014-02-09 DIAGNOSIS — E785 Hyperlipidemia, unspecified: Secondary | ICD-10-CM | POA: Diagnosis not present

## 2014-02-09 DIAGNOSIS — J45909 Unspecified asthma, uncomplicated: Secondary | ICD-10-CM | POA: Insufficient documentation

## 2014-02-09 DIAGNOSIS — R9431 Abnormal electrocardiogram [ECG] [EKG]: Secondary | ICD-10-CM | POA: Diagnosis not present

## 2014-02-09 MED ORDER — REGADENOSON 0.4 MG/5ML IV SOLN
0.4000 mg | Freq: Once | INTRAVENOUS | Status: AC
  Administered 2014-02-09: 0.4 mg via INTRAVENOUS

## 2014-02-09 MED ORDER — TECHNETIUM TC 99M SESTAMIBI GENERIC - CARDIOLITE
10.8000 | Freq: Once | INTRAVENOUS | Status: AC | PRN
  Administered 2014-02-09: 11 via INTRAVENOUS

## 2014-02-09 MED ORDER — TECHNETIUM TC 99M SESTAMIBI GENERIC - CARDIOLITE
33.0000 | Freq: Once | INTRAVENOUS | Status: AC | PRN
  Administered 2014-02-09: 33 via INTRAVENOUS

## 2014-02-09 NOTE — Progress Notes (Signed)
[facility] [place], [place] [address] [phone]    Cardiology Nuclear Med Study  Alisha [name] is a 53 y.o. female     MRN : [id number]     DOB: 11/05/1961  Procedure Date: 02/09/2014  Nuclear Med Background Indication for Stress Test:  Evaluation for Ischemia, and Pending Surgical Clearance for Breast Surgery by Erroll Luna, MD History:  MI 2008 (per pt.), Cath, Echo 2007 EF 45%, Asthma, hx. pulmonary edema Cardiac Risk Factors: Carotid Disease, Hypertension, Lipids, PVD and Smoker  Symptoms:  None indicated   Nuclear Pre-Procedure Caffeine/Decaff Intake:  None > 12 hrs NPO After: 12:00am   Lungs:  clear O2 Sat: 98% on room air. IV 0.9% NS with Angio Cath:  22g  IV Site: R Antecubital x 1, tolerated well IV Started by:  Irven Baltimore, RN  Chest Size (in):  36 Cup Size: C  Height: 5\' 5"  (1.651 m)  Weight:  199 lb (90.266 kg)  BMI:  Body mass index is 33.12 kg/(m^2). Tech Comments:  Held Coreg x 24 hrs    Nuclear Med Study 1 or 2 day study: 1 day  Stress Test Type:  Treadmill/Lexiscan  Reading MD: N/A  Order Authorizing Provider:  Mertie Moores, MD  Resting Radionuclide: Technetium 44m Sestamibi  Resting Radionuclide Dose: 11.0 mCi   Stress Radionuclide:  Technetium 25m Sestamibi  Stress Radionuclide Dose: 33.0 mCi           Stress Protocol Rest HR: 67 Stress HR: 100  Rest BP: 150/90 Stress BP: 111/75  Exercise Time (min): n/a METS: n/a           Dose of Adenosine (mg):  n/a Dose of Lexiscan: 0.4 mg  Dose of Atropine (mg): n/a Dose of Dobutamine: n/a mcg/kg/min (at max HR)  Stress Test Technologist: Glade Lloyd, BS-ES  Nuclear Technologist:  Charlton Amor, CNMT     Rest Procedure:  Myocardial perfusion imaging was performed at rest 45 minutes following the intravenous administration of Technetium 66m Sestamibi. Rest ECG: Normal sinus rhythm. Nonspecific ST-T wave changes.  Stress Procedure:  The patient  received IV Lexiscan 0.4 mg over 15-seconds with concurrent low level exercise and then Technetium 80m Sestamibi was injected at 30-seconds while the patient continued walking one more minute.  Quantitative spect images were obtained after a 45-minute delay.  Patient complained of calf and thigh discomfort.  Stress ECG: No significant change from baseline ECG  QPS Raw Data Images:  Patient motion noted; appropriate software correction applied. Stress Images:  Small area of mild decreased uptake affecting the mid/apical inferior wall. There may be slight reversibility Rest Images:  Very small area of mild decreased uptake affecting the mid/apical inferior wall. Subtraction (SDS):  Very slight reversibility Transient Ischemic Dilatation (Normal <1.22):  0.97 Lung/Heart Ratio (Normal <0.45):  0.32  Quantitative Gated Spect Images QGS EDV:  133 ml QGS ESV:  70 ml  Impression Exercise Capacity:  Lexiscan with low level exercise. BP Response:  Normal blood pressure response. Clinical Symptoms:  Calves and thighs tight ECG Impression:  No significant ST segment change suggestive of ischemia. Comparison with Prior Nuclear Study: No images to compare  Overall Impression:  Low risk nuclear scan. There may be very slight area of scar in the mid/apical inferior wall. There may be very slight ischemia. Ejection fraction is similar to history in the past.  LV Ejection Fraction: 47%.  LV Wall Motion:  No focal wall motion abnormalities.  Dola Argyle, MD

## 2014-02-14 ENCOUNTER — Ambulatory Visit (INDEPENDENT_AMBULATORY_CARE_PROVIDER_SITE_OTHER): Payer: Medicare Other | Admitting: Pharmacist

## 2014-02-14 DIAGNOSIS — I82409 Acute embolism and thrombosis of unspecified deep veins of unspecified lower extremity: Secondary | ICD-10-CM

## 2014-02-14 DIAGNOSIS — Z7901 Long term (current) use of anticoagulants: Secondary | ICD-10-CM

## 2014-02-14 LAB — POCT INR: INR: 2.1

## 2014-02-14 MED ORDER — WARFARIN SODIUM 7.5 MG PO TABS: 7.5000 mg | ORAL_TABLET | Freq: Every day | ORAL | Status: DC

## 2014-02-14 NOTE — Patient Instructions (Signed)
Patient instructed to take medications as defined in the Anti-coagulation Track section of this encounter.  Patient instructed to take today's dose.  Patient verbalized understanding of these instructions.    

## 2014-02-14 NOTE — Progress Notes (Signed)
Anti-Coagulation Progress Note  Alisha [name] is a 53 y.o. female who is currently on an anti-coagulation regimen.    RECENT RESULTS: Recent results are below, the most recent result is correlated with a dose of 41.25 mg. per week: Lab Results  Component Value Date   INR 2.10 02/14/2014   INR 1.0 01/31/2014   INR 1.40 01/21/2014    ANTI-COAG DOSE: Anticoagulation Dose Instructions as of 02/14/2014     Dorene Grebe Tue Wed Thu Fri Sat   New Dose 7.5 mg 7.5 mg 7.5 mg 7.5 mg 7.5 mg 7.5 mg 7.5 mg       ANTICOAG SUMMARY: Anticoagulation Episode Summary   Current INR goal 2.0-3.0  Next INR check 02/28/2014  INR from last check 2.10 (02/14/2014)  Weekly max dose   Target end date   [facility]  Preferred lab   Send INR reminders to ANTICOAG IMP   Indications  DVT [453.40] Long term current use of anticoagulant [V58.61]        Comments       Anticoagulation Care Providers   Provider Role Specialty Phone number   Burman Freestone, MD  Internal Medicine [phone]      ANTICOAG TODAY: Anticoagulation Summary as of 02/14/2014   INR goal 2.0-3.0  Selected INR 2.10 (02/14/2014)  Next INR check 02/28/2014  Target end date    Indications  DVT [453.40] Long term current use of anticoagulant [V58.61]      Anticoagulation Episode Summary   [facility]   Preferred lab    Send INR reminders to ANTICOAG IMP   Comments     Anticoagulation Care Providers   Provider Role Specialty Phone number   Burman Freestone, MD  Internal Medicine [phone]      PATIENT INSTRUCTIONS: Patient Instructions  Patient instructed to take medications as defined in the Anti-coagulation Track section of this encounter.  Patient instructed to take today's dose.  Patient verbalized understanding of these instructions.       FOLLOW-UP Return in 2 weeks (on 02/28/2014) for Follow up INR at 1015.  Jorene Guest, III Pharm.D., CACP

## 2014-02-18 ENCOUNTER — Other Ambulatory Visit: Payer: Medicare Other

## 2014-02-18 ENCOUNTER — Other Ambulatory Visit: Payer: Self-pay

## 2014-02-18 DIAGNOSIS — I10 Essential (primary) hypertension: Secondary | ICD-10-CM

## 2014-02-18 DIAGNOSIS — I739 Peripheral vascular disease, unspecified: Secondary | ICD-10-CM

## 2014-02-18 DIAGNOSIS — I82409 Acute embolism and thrombosis of unspecified deep veins of unspecified lower extremity: Secondary | ICD-10-CM

## 2014-02-18 DIAGNOSIS — Z01812 Encounter for preprocedural laboratory examination: Secondary | ICD-10-CM

## 2014-02-21 ENCOUNTER — Ambulatory Visit
Admission: RE | Admit: 2014-02-21 | Discharge: 2014-02-21 | Disposition: A | Discharge status: Home / Self Care | Payer: Medicare Other | Source: Ambulatory Visit | Attending: Cardiovascular Disease | Admitting: Cardiovascular Disease

## 2014-02-21 ENCOUNTER — Other Ambulatory Visit (INDEPENDENT_AMBULATORY_CARE_PROVIDER_SITE_OTHER): Payer: Medicare Other

## 2014-02-21 ENCOUNTER — Ambulatory Visit: Payer: Medicare Other

## 2014-02-21 DIAGNOSIS — Z01812 Encounter for preprocedural laboratory examination: Secondary | ICD-10-CM

## 2014-02-21 DIAGNOSIS — I739 Peripheral vascular disease, unspecified: Secondary | ICD-10-CM | POA: Diagnosis not present

## 2014-02-21 DIAGNOSIS — I10 Essential (primary) hypertension: Secondary | ICD-10-CM

## 2014-02-21 DIAGNOSIS — I82409 Acute embolism and thrombosis of unspecified deep veins of unspecified lower extremity: Secondary | ICD-10-CM

## 2014-02-21 DIAGNOSIS — Z01810 Encounter for preprocedural cardiovascular examination: Secondary | ICD-10-CM | POA: Diagnosis not present

## 2014-02-21 LAB — BASIC METABOLIC PANEL
BUN: 17 mg/dL (ref 6–23)
CALCIUM: 9.2 mg/dL (ref 8.4–10.5)
CO2: 26 mEq/L (ref 19–32)
CREATININE: 1 mg/dL (ref 0.4–1.2)
Chloride: 103 mEq/L (ref 96–112)
GFR: 73.3 mL/min (ref >60.00)
Glucose, Bld: 155 mg/dL — ABNORMAL HIGH (ref 70–99)
Potassium: 3.4 mEq/L — ABNORMAL LOW (ref 3.5–5.1)
Sodium: 139 mEq/L (ref 135–145)

## 2014-02-21 LAB — CBC WITH DIFFERENTIAL/PLATELET
Basophils Absolute: 0 10*3/uL (ref 0.0–0.1)
Basophils Relative: 0.4 % (ref 0.0–3.0)
EOS ABS: 0.2 10*3/uL (ref 0.0–0.7)
EOS PCT: 1.9 % (ref 0.0–5.0)
HCT: 44.5 % (ref 36.0–46.0)
Hemoglobin: 14.6 g/dL (ref 12.0–15.0)
Lymphocytes Relative: 24.1 % (ref 12.0–46.0)
Lymphs Abs: 2.2 10*3/uL (ref 0.7–4.0)
MCHC: 32.9 g/dL (ref 30.0–36.0)
MCV: 99.5 fl (ref 78.0–100.0)
MONO ABS: 0.4 10*3/uL (ref 0.1–1.0)
Monocytes Relative: 4.8 % (ref 3.0–12.0)
NEUTROS PCT: 68.8 % (ref 43.0–77.0)
Neutro Abs: 6.2 10*3/uL (ref 1.4–7.7)
PLATELETS: 183 10*3/uL (ref 150.0–400.0)
RBC: 4.47 Mil/uL (ref 3.87–5.11)
RDW: 13 % (ref 11.5–14.6)
WBC: 9.1 10*3/uL (ref 4.5–10.5)

## 2014-02-21 LAB — PROTIME-INR
INR: 2.7 ratio — ABNORMAL HIGH (ref 0.8–1.0)
Prothrombin Time: 27.6 s — ABNORMAL HIGH (ref 10.2–12.4)

## 2014-02-22 ENCOUNTER — Other Ambulatory Visit: Payer: Self-pay | Admitting: *Deleted

## 2014-02-22 ENCOUNTER — Encounter (HOSPITAL_COMMUNITY): Payer: Self-pay | Admitting: Pharmacy Technician

## 2014-02-22 MED ORDER — POTASSIUM CHLORIDE ER 10 MEQ PO TBCR: 10.0000 meq | EXTENDED_RELEASE_TABLET | Freq: Every day | ORAL | Status: DC

## 2014-02-24 MED ORDER — CEFAZOLIN SODIUM-DEXTROSE 2-3 GM-% IV SOLR
2.0000 g | INTRAVENOUS | Status: DC
Start: 2014-02-25 — End: 2014-02-25

## 2014-02-25 ENCOUNTER — Encounter (HOSPITAL_COMMUNITY)
Admission: RE | Disposition: A | Discharge status: Home / Self Care | Payer: Self-pay | Source: Ambulatory Visit | Attending: Cardiology

## 2014-02-25 ENCOUNTER — Ambulatory Visit (HOSPITAL_COMMUNITY)
Admission: RE | Admit: 2014-02-25 | Discharge: 2014-02-25 | Disposition: A | Discharge status: Home / Self Care | Payer: Medicare Other | Source: Ambulatory Visit | Attending: Cardiology | Admitting: Cardiology

## 2014-02-25 DIAGNOSIS — M1710 Unilateral primary osteoarthritis, unspecified knee: Secondary | ICD-10-CM | POA: Diagnosis not present

## 2014-02-25 DIAGNOSIS — Z7982 Long term (current) use of aspirin: Secondary | ICD-10-CM | POA: Diagnosis not present

## 2014-02-25 DIAGNOSIS — F329 Major depressive disorder, single episode, unspecified: Secondary | ICD-10-CM | POA: Insufficient documentation

## 2014-02-25 DIAGNOSIS — I10 Essential (primary) hypertension: Secondary | ICD-10-CM | POA: Insufficient documentation

## 2014-02-25 DIAGNOSIS — Z86718 Personal history of other venous thrombosis and embolism: Secondary | ICD-10-CM | POA: Diagnosis not present

## 2014-02-25 DIAGNOSIS — J811 Chronic pulmonary edema: Secondary | ICD-10-CM | POA: Diagnosis not present

## 2014-02-25 DIAGNOSIS — I739 Peripheral vascular disease, unspecified: Secondary | ICD-10-CM | POA: Diagnosis not present

## 2014-02-25 DIAGNOSIS — F3289 Other specified depressive episodes: Secondary | ICD-10-CM | POA: Diagnosis not present

## 2014-02-25 DIAGNOSIS — Z7901 Long term (current) use of anticoagulants: Secondary | ICD-10-CM | POA: Diagnosis not present

## 2014-02-25 DIAGNOSIS — E785 Hyperlipidemia, unspecified: Secondary | ICD-10-CM | POA: Diagnosis not present

## 2014-02-25 DIAGNOSIS — G894 Chronic pain syndrome: Secondary | ICD-10-CM | POA: Diagnosis not present

## 2014-02-25 DIAGNOSIS — F411 Generalized anxiety disorder: Secondary | ICD-10-CM | POA: Diagnosis not present

## 2014-02-25 DIAGNOSIS — N63 Unspecified lump in unspecified breast: Secondary | ICD-10-CM | POA: Insufficient documentation

## 2014-02-25 DIAGNOSIS — R9430 Abnormal result of cardiovascular function study, unspecified: Secondary | ICD-10-CM | POA: Diagnosis not present

## 2014-02-25 DIAGNOSIS — N3946 Mixed incontinence: Secondary | ICD-10-CM | POA: Diagnosis not present

## 2014-02-25 DIAGNOSIS — J45909 Unspecified asthma, uncomplicated: Secondary | ICD-10-CM | POA: Insufficient documentation

## 2014-02-25 DIAGNOSIS — I2510 Atherosclerotic heart disease of native coronary artery without angina pectoris: Secondary | ICD-10-CM | POA: Diagnosis not present

## 2014-02-25 DIAGNOSIS — G2581 Restless legs syndrome: Secondary | ICD-10-CM | POA: Diagnosis not present

## 2014-02-25 DIAGNOSIS — N926 Irregular menstruation, unspecified: Secondary | ICD-10-CM | POA: Diagnosis not present

## 2014-02-25 HISTORY — PX: LEFT HEART CATHETERIZATION WITH CORONARY ANGIOGRAM: SHX5451

## 2014-02-25 LAB — PROTIME-INR
INR: 1.01 (ref 0.00–1.49)
Prothrombin Time: 13.1 seconds (ref 11.6–15.2)

## 2014-02-25 LAB — NO BLOOD PRODUCTS

## 2014-02-25 LAB — POTASSIUM: Potassium: 4 mEq/L (ref 3.7–5.3)

## 2014-02-25 SURGERY — LEFT HEART CATHETERIZATION WITH CORONARY ANGIOGRAM
Anesthesia: LOCAL

## 2014-02-25 MED ORDER — SODIUM CHLORIDE 0.9 % IV SOLN: 1.0000 mL/kg/h | INTRAVENOUS | Status: DC

## 2014-02-25 MED ORDER — LIDOCAINE HCL (PF) 1 % IJ SOLN
INTRAMUSCULAR | Status: AC
  Filled 2014-02-25: qty 30

## 2014-02-25 MED ORDER — SODIUM CHLORIDE 0.9 % IV SOLN: 250.0000 mL | INTRAVENOUS | Status: DC | PRN

## 2014-02-25 MED ORDER — NITROGLYCERIN 0.2 MG/ML ON CALL CATH LAB
INTRAVENOUS | Status: AC
  Filled 2014-02-25: qty 1

## 2014-02-25 MED ORDER — ASPIRIN 81 MG PO CHEW
81.0000 mg | CHEWABLE_TABLET | ORAL | Status: AC
  Administered 2014-02-25: 81 mg via ORAL
  Filled 2014-02-25: qty 1

## 2014-02-25 MED ORDER — MIDAZOLAM HCL 2 MG/2ML IJ SOLN
INTRAMUSCULAR | Status: AC
  Filled 2014-02-25: qty 2

## 2014-02-25 MED ORDER — HEPARIN SODIUM (PORCINE) 1000 UNIT/ML IJ SOLN
INTRAMUSCULAR | Status: AC
  Filled 2014-02-25: qty 1

## 2014-02-25 MED ORDER — VERAPAMIL HCL 2.5 MG/ML IV SOLN
INTRAVENOUS | Status: AC
  Filled 2014-02-25: qty 2

## 2014-02-25 MED ORDER — SODIUM CHLORIDE 0.9 % IJ SOLN: 3.0000 mL | Freq: Two times a day (BID) | INTRAMUSCULAR | Status: DC

## 2014-02-25 MED ORDER — FENTANYL CITRATE 0.05 MG/ML IJ SOLN
INTRAMUSCULAR | Status: AC
  Filled 2014-02-25: qty 2

## 2014-02-25 MED ORDER — SODIUM CHLORIDE 0.9 % IJ SOLN: 3.0000 mL | INTRAMUSCULAR | Status: DC | PRN

## 2014-02-25 MED ORDER — HEPARIN (PORCINE) IN NACL 2-0.9 UNIT/ML-% IJ SOLN
INTRAMUSCULAR | Status: AC
  Filled 2014-02-25: qty 1000

## 2014-02-25 MED ORDER — SODIUM CHLORIDE 0.9 % IV SOLN
INTRAVENOUS | Status: DC
  Administered 2014-02-25: 12:00:00 via INTRAVENOUS

## 2014-02-25 NOTE — Discharge Instructions (Addendum)
Resume coumadin on Saturday 02/26/14 at prior dose. Radial Site Care Refer to this sheet in the next few weeks. These instructions provide you with information on caring for yourself after your procedure. Your caregiver may also give you more specific instructions. Your treatment has been planned according to current medical practices, but problems sometimes occur. Call your caregiver if you have any problems or questions after your procedure. HOME CARE INSTRUCTIONS  You may shower the day after the procedure.Remove the bandage (dressing) and gently wash the site with plain soap and water.Gently pat the site dry.  Do not apply powder or lotion to the site.  Do not submerge the affected site in water for 3 to 5 days.  Inspect the site at least twice daily.  Do not flex or bend the affected arm for 24 hours.  No lifting over 5 pounds (2.3 kg) for 5 days after your procedure.  Do not drive home if you are discharged the same day of the procedure. Have someone else drive you.  You may drive 24 hours after the procedure unless otherwise instructed by your caregiver.  Do not operate machinery or power tools for 24 hours.  A responsible adult should be with you for the first 24 hours after you arrive home. What to expect:  Any bruising will usually fade within 1 to 2 weeks.  Blood that collects in the tissue (hematoma) may be painful to the touch. It should usually decrease in size and tenderness within 1 to 2 weeks. SEEK IMMEDIATE MEDICAL CARE IF:  You have unusual pain at the radial site.  You have redness, warmth, swelling, or pain at the radial site.  You have drainage (other than a small amount of blood on the dressing).  You have chills.  You have a fever or persistent symptoms for more than 72 hours.  You have a fever and your symptoms suddenly get worse.  Your arm becomes pale, cool, tingly, or numb.  You have heavy bleeding from the site. Hold pressure on the  site. Document Released: 01/18/2011 Document Revised: 03/09/2012 Document Reviewed: 01/18/2011 [facility] Patient Information 2014 [place], [place].

## 2014-02-25 NOTE — H&P (View-Only) (Signed)
Alisha [name] Date of Birth  [birth year]       [facility]    [employer] [address], [address] [place], [place], [place]  [address]   [place], [place]  [address] [address]   Fax  [phone]    Fax [phone]  Problem List: 1. Abnormal EKG-T-wave inversions in anterolateral leads 2. Peripheral vascular disease 3. History of deep vein thrombosis 3. Depression 4. Anxiety 5. Hypertension 6. History of breast mass-status post biopsy in 2009 which showed no evidence of malignancy  History of Present Illness:  Alisha [name] is a 53 yo female who is referred here by the surgeons for a preoperative visit. She has several risk factors of coronary artery disease including cigarette smoking and peripheral vascular disease. She was noted to have EKG changes on her preop EKG. She was sent here for further evaluation.  She is very sleepy and had a very difficult time staying awake in the office.  Current Outpatient Prescriptions on File Prior to Visit  Medication Sig Dispense Refill  . albuterol (PROAIR HFA) 108 (90 BASE) MCG/ACT inhaler Inhale 2 puffs into the lungs every 6 (six) hours as needed for wheezing. Take 1-2 puffs as needed every 4-6 hours as needed for shortness of breath  1 Inhaler  3  . amLODipine (NORVASC) 10 MG tablet Take 1 tablet (10 mg total) by mouth daily.  30 tablet  11  . aspirin 81 MG tablet Take 1 tablet (81 mg total) by mouth daily.  100 tablet  3  . atorvastatin (LIPITOR) 40 MG tablet Take 1 tablet (40 mg total) by mouth at bedtime.  90 tablet  3  . buPROPion (WELLBUTRIN XL) 150 MG 24 hr tablet Take 150 mg by mouth daily.      . carvedilol (COREG) 25 MG tablet TAKE ONE TABLET BY MOUTH TWICE DAILY  180 tablet  0  . diazepam (VALIUM) 5 MG tablet Take 10 mg by mouth every 8 (eight) hours as needed.       . DULoxetine (CYMBALTA) 60 MG capsule Take 60 mg by mouth. Take 1 capsule in the morning and 1 capsule at  bedtime       . gabapentin (NEURONTIN) 800 MG tablet TAKE 1 TABLET BY MOUTH IN THE MORNING, 1 TABLET BY MOUTH MIDDAY, AND 2 TABLETS AT BEDTIME  120 tablet  3  . hydrOXYzine (ATARAX/VISTARIL) 25 MG tablet Take 25 mg by mouth 3 (three) times daily as needed.      Marland Kitchen lisinopril-hydrochlorothiazide (PRINZIDE,ZESTORETIC) 20-12.5 MG per tablet Take 2 tablets by mouth daily.  60 tablet  5  . naproxen (NAPROSYN) 500 MG tablet Take 1 tablet (500 mg total) by mouth 2 (two) times daily with a meal.  60 tablet  0  . traZODone (DESYREL) 100 MG tablet Take 100-200 mg by mouth at bedtime.      Marland Kitchen warfarin (COUMADIN) 7.5 MG tablet Take 1& 1/2 tablets on  Days of week except on Wednesday:  Take only 1 tablet every Wednesday.  50 tablet  3  . ziprasidone (GEODON) 60 MG capsule Take 60 mg by mouth 2 (two) times daily with a meal.        No current facility-administered medications on file prior to visit.    Allergies  Allergen Reactions  . Propoxyphene N-Acetaminophen Swelling    Past Medical History  Diagnosis Date  . DVT (deep venous thrombosis)  bilateral, 2 episodes: Requires lifelong therapy  . Chronic pain syndrome   . Restless leg syndrome   . Depression   . Hyperlipidemia   . Hypertension   . PVD (peripheral vascular disease)     s/p L fem-pop bypass 11/21/08; graft occluded 12/28/08;  aortogram w/ bilat LE runoff: 1.  bilat diffuse SFA occlusive dz, 2.  Mod to severe above-knee popliteal dz,  3.  Bilat 3-vessel runoff w/ mild tibial occlusive dz.  . Tobacco abuse   . Degenerative joint disease of knee   . Breast lump 02/2008    Biopsy 05/2008: showed no evidence of malignancy  . Irregular menses 9/08  . Pulmonary edema   . Anxiety   . Joint pain   . Mixed stress and urge urinary incontinence     [facility], underwent cystoscopy & uroflowmetry   . Endometrial mass 10/12/2012    Endometrium, biopsy on 11/04/12 - PROLIFERATIVE ENDOMETRIUM AND ABUNDANT MUCUS. NO  HYPERPLASIA OR CARCINOMA.   . Sigmoid diverticulitis 10/26/2012  . Asthma   . Breast mass in female     bi lat     Past Surgical History  Procedure Laterality Date  . Femoral bypass  11/09  .  right external iliac artery stent    . Right breast needle-localized lumpectomy    . Pr vein bypass graft,aorto-fem-pop    . Carotid endarterectomy    . Tubal ligation    . Tonsillectomy      History  Smoking status  . Current Every Day Smoker -- 0.25 packs/day for 18 years  . Types: Cigarettes  Smokeless tobacco  . Never Used    Comment: 3-6 per day    History  Alcohol Use  . Yes    Comment: rare    Family History  Problem Relation Age of Onset  . Breast cancer Mother   . Colon cancer Neg Hx   . Rectal cancer Neg Hx   . Stomach cancer Neg Hx   . Breast cancer Other     GRE[facility] AUNT    Reviw of Systems:  Reviewed in the HPI.  All other systems are negative.  Physical Exam: Blood pressure 128/100, pulse 68, height 5' 5" (1.651 m), weight 193 lb 1.9 oz (87.599 kg), last menstrual period 11/23/2013. Wt Readings from Last 3 Encounters:  01/28/14 193 lb 1.9 oz (87.599 kg)  01/24/14 200 lb (90.719 kg)  01/25/14 201 lb (91.173 kg)     General: patient is very lethargic.   It was difficult to keep her awake during the interview and exam.  Head: Normocephalic, atraumatic, sclera non-icteric, mucus membranes are moist,   Neck: Supple. Carotids are 2 + without bruits. No JVD   Lungs: Clear   Heart: RR  Abdomen: Soft, non-tender, non-distended with normal bowel sounds.  Msk:  Strength and tone are normal   Extremities: No clubbing or cyanosis. No edema.     Neuro: CN II - XII intact.  Very lethergic  Psych:  lethargic  ECG: Jan. 30, 2015: Normal sinus rhythm at 60 beats a minute. She has T-wave abnormalities in anterolateral leads consistent with ischemia.  Assessment / Plan:   

## 2014-02-25 NOTE — Interval H&P Note (Signed)
History and Physical Interval Note:  02/25/2014 1:11 PM  Alisha [name]  has presented today for surgery, with the diagnosis of abnormal miview  The various methods of treatment have been discussed with the patient and family. After consideration of risks, benefits and other options for treatment, the patient has consented to  Procedure(s): LEFT HEART CATHETERIZATION WITH CORONARY ANGIOGRAM (N/A) as a surgical intervention .  The patient's history has been reviewed, patient examined, no change in status, stable for surgery.  I have reviewed the patient's chart and labs.  Questions were answered to the patient's satisfaction.   Cath Lab Visit (complete for each Cath Lab visit)  Clinical Evaluation Leading to the Procedure:   ACS: no  Non-ACS:    Anginal Classification: CCS II  Anti-ischemic medical therapy: Maximal Therapy (2 or more classes of medications)  Non-Invasive Test Results: Low-risk stress test findings: cardiac mortality <1%/year  Prior CABG: No previous CABG        [facility] 02/25/2014 1:11 PM

## 2014-02-25 NOTE — CV Procedure (Signed)
    Cardiac Catheterization Procedure Note  Name: Alisha [name] MRN: [id number] DOB: [birth year]  Procedure: Left Heart Cath, Selective Coronary Angiography, LV angiography  Indication: 53 yo BF with history of PAD and tobacco abuse was seen for preoperative evaluation for breast CA. Ecg was abnormal and Myoview was abnormal.   Procedural Details: The right wrist was prepped, draped, and anesthetized with 1% lidocaine. Using the modified Seldinger technique, a 5 Noell sheath was introduced into the right radial artery. 3 mg of verapamil was administered through the sheath, weight-based unfractionated heparin was administered intravenously. Standard Judkins catheters were used for selective coronary angiography and left ventriculography. Catheter exchanges were performed over an exchange length guidewire. There were no immediate procedural complications. A TR band was used for radial hemostasis at the completion of the procedure.  The patient was transferred to the post catheterization recovery area for further monitoring.  Procedural Findings: Hemodynamics: AO 161/85 mean 116 mm Hg LV 163/19 mm Hg  Coronary angiography: Coronary dominance: right  Left mainstem: Normal.   Left anterior descending (LAD): Moderate calcification in the proximal with diffuse 20% disease.  Left circumflex (LCx): Gives rise to 2 large OM branches. Normal.   Right coronary artery (RCA): Normal.  Left ventriculography: Left ventricular systolic function is normal, LVEF is estimated at 55-65%, there is no significant mitral regurgitation   Final Conclusions:   1. Nonobstructive CAD 2. Normal LV function.  Recommendations: risk factor modification. Patient is low risk for planned breast surgery.  Collier Salina [facility] 02/25/2014, 1:43 PM

## 2014-02-28 ENCOUNTER — Ambulatory Visit (INDEPENDENT_AMBULATORY_CARE_PROVIDER_SITE_OTHER): Payer: Medicare Other | Admitting: Pharmacist

## 2014-02-28 ENCOUNTER — Other Ambulatory Visit: Payer: Self-pay | Admitting: Internal Medicine

## 2014-02-28 DIAGNOSIS — I10 Essential (primary) hypertension: Secondary | ICD-10-CM

## 2014-02-28 DIAGNOSIS — I82409 Acute embolism and thrombosis of unspecified deep veins of unspecified lower extremity: Secondary | ICD-10-CM

## 2014-02-28 DIAGNOSIS — Z7901 Long term (current) use of anticoagulants: Secondary | ICD-10-CM

## 2014-02-28 LAB — POCT INR: INR: 1.3

## 2014-02-28 NOTE — Patient Instructions (Signed)
Patient instructed to take medications as defined in the Anti-coagulation Track section of this encounter.  Patient instructed to take today's dose.  Patient verbalized understanding of these instructions.    

## 2014-02-28 NOTE — Progress Notes (Signed)
Anti-Coagulation Progress Note  Alisha [name] is a 53 y.o. female who is currently on an anti-coagulation regimen.    RECENT RESULTS: Recent results are below, the most recent result is correlated with a dose of 7.5mg  QD x 2 days only. Prior to these last 2 doses administered, she had been OFF warfarin x 5 days preceding cardiac cath performed this past Friday.  Lab Results  Component Value Date   INR 1.30 02/28/2014   INR 1.01 02/25/2014   INR 2.7* 02/21/2014    ANTI-COAG DOSE: Anticoagulation Dose Instructions as of 02/28/2014     Sun Mon Tue Wed Thu Fri Sat   New Dose 7.5 mg 11.25 mg 7.5 mg 11.25 mg 7.5 mg 11.25 mg 7.5 mg       ANTICOAG SUMMARY: Anticoagulation Episode Summary   Current INR goal 2.0-3.0  Next INR check 03/14/2014  INR from last check 1.30! (02/28/2014)  Weekly max dose   Target end date   [facility]R check location [facility]  Preferred lab   Send INR reminders to ANTICOAG IMP   Indications  DVT [453.40] Long term current use of anticoagulant [V58.61]        Comments       Anticoagulation Care Providers   Provider Role Specialty Phone number   Burman Freestone, MD  Internal Medicine [phone]      ANTICOAG TODAY: Anticoagulation Summary as of 02/28/2014   INR goal 2.0-3.0  Selected INR 1.30! (02/28/2014)  Next INR check 03/14/2014  Target end date    Indications  DVT [453.40] Long term current use of anticoagulant [V58.61]      Anticoagulation Episode Summary   INR check location [facility]   Preferred lab    Send INR reminders to ANTICOAG IMP   Comments     Anticoagulation Care Providers   Provider Role Specialty Phone number   Burman Freestone, MD  Internal Medicine [phone]      PATIENT INSTRUCTIONS: Patient Instructions  Patient instructed to take medications as defined in the Anti-coagulation Track section of this encounter.  Patient instructed to take today's dose.  Patient verbalized understanding of these instructions.        FOLLOW-UP Return in 2 weeks (on 03/14/2014) for Follow up INR at 1030h.  Jorene Guest, III Pharm.D., CACP

## 2014-03-07 ENCOUNTER — Other Ambulatory Visit: Payer: Self-pay | Admitting: *Deleted

## 2014-03-07 DIAGNOSIS — G894 Chronic pain syndrome: Secondary | ICD-10-CM

## 2014-03-09 MED ORDER — NAPROXEN 500 MG PO TABS: 500.0000 mg | ORAL_TABLET | Freq: Two times a day (BID) | ORAL | Status: DC

## 2014-03-11 ENCOUNTER — Encounter: Payer: Medicare Other | Admitting: Nurse Practitioner

## 2014-03-14 ENCOUNTER — Ambulatory Visit (INDEPENDENT_AMBULATORY_CARE_PROVIDER_SITE_OTHER): Payer: Medicare Other | Admitting: Pharmacist

## 2014-03-14 DIAGNOSIS — I82409 Acute embolism and thrombosis of unspecified deep veins of unspecified lower extremity: Secondary | ICD-10-CM

## 2014-03-14 DIAGNOSIS — E785 Hyperlipidemia, unspecified: Secondary | ICD-10-CM | POA: Diagnosis not present

## 2014-03-14 DIAGNOSIS — Z7901 Long term (current) use of anticoagulants: Secondary | ICD-10-CM | POA: Diagnosis not present

## 2014-03-14 LAB — POCT INR: INR: 2.1

## 2014-03-14 NOTE — Progress Notes (Signed)
Indication: Recurrent deep venous thrombosis. Duration: Lifelong. INR: At target. Agree with Dr. Groce's assessment and plan. 

## 2014-03-14 NOTE — Progress Notes (Signed)
Anti-Coagulation Progress Note  Alisha [name] is a 53 y.o. female who is currently on an anti-coagulation regimen.    RECENT RESULTS: Recent results are below, the most recent result is correlated with a dose of 63.75 mg. per week: Lab Results  Component Value Date   INR 2.10 03/14/2014   INR 1.30 02/28/2014   INR 1.01 02/25/2014    ANTI-COAG DOSE: Anticoagulation Dose Instructions as of 03/14/2014     Dorene Grebe Tue Wed Thu Fri Sat   New Dose 7.5 mg 11.25 mg 11.25 mg 11.25 mg 7.5 mg 11.25 mg 7.5 mg       ANTICOAG SUMMARY: Anticoagulation Episode Summary   Current INR goal 2.0-3.0  Next INR check 04/04/2014  INR from last check 2.10 (03/14/2014)  Weekly max dose   Target end date   [facility] check [facility]  Preferred lab   Send INR reminders to ANTICOAG IMP   Indications  DVT [453.40] Long term current use of anticoagulant [V58.61]        Comments       Anticoagulation Care Providers   Provider Role Specialty Phone number   Burman Freestone, MD  Internal Medicine [phone]      ANTICOAG TODAY: Anticoagulation Summary as of 03/14/2014   INR goal 2.0-3.0  Selected INR 2.10 (03/14/2014)  Next INR check 04/04/2014  Target end date    Indications  DVT [453.40] Long term current use of anticoagulant [V58.61]      Anticoagulation Episode Summary   [facility]R check location [facility]   Preferred lab    Send INR reminders to ANTICOAG IMP   Comments     Anticoagulation Care Providers   Provider Role Specialty Phone number   Burman Freestone, MD  Internal Medicine [phone]      PATIENT INSTRUCTIONS: Patient Instructions  Patient instructed to take medications as defined in the Anti-coagulation Track section of this encounter.  Patient instructed to take today's dose.  Patient verbalized understanding of these instructions.       FOLLOW-UP Return in 3 weeks (on 04/04/2014) for Follow up INR at 1030h.  Jorene Guest, III Pharm.D., CACP

## 2014-03-14 NOTE — Patient Instructions (Signed)
Patient instructed to take medications as defined in the Anti-coagulation Track section of this encounter.  Patient instructed to take today's dose.  Patient verbalized understanding of these instructions.    

## 2014-03-25 ENCOUNTER — Encounter: Payer: Self-pay | Admitting: Internal Medicine

## 2014-03-25 ENCOUNTER — Encounter: Payer: Self-pay | Admitting: Nurse Practitioner

## 2014-03-25 ENCOUNTER — Ambulatory Visit (INDEPENDENT_AMBULATORY_CARE_PROVIDER_SITE_OTHER): Payer: Medicare Other | Admitting: Nurse Practitioner

## 2014-03-25 ENCOUNTER — Ambulatory Visit (INDEPENDENT_AMBULATORY_CARE_PROVIDER_SITE_OTHER): Payer: Medicare Other | Admitting: Internal Medicine

## 2014-03-25 VITALS — BP 120/84 | HR 72 | Ht 64.5 in | Wt 205.1 lb

## 2014-03-25 VITALS — BP 169/102 | HR 76 | Temp 98.0°F | Ht 64.0 in | Wt 207.0 lb

## 2014-03-25 DIAGNOSIS — M1710 Unilateral primary osteoarthritis, unspecified knee: Secondary | ICD-10-CM | POA: Diagnosis not present

## 2014-03-25 DIAGNOSIS — G894 Chronic pain syndrome: Secondary | ICD-10-CM | POA: Diagnosis not present

## 2014-03-25 DIAGNOSIS — I10 Essential (primary) hypertension: Secondary | ICD-10-CM | POA: Diagnosis not present

## 2014-03-25 DIAGNOSIS — Z9889 Other specified postprocedural states: Secondary | ICD-10-CM | POA: Diagnosis not present

## 2014-03-25 DIAGNOSIS — N63 Unspecified lump in unspecified breast: Secondary | ICD-10-CM | POA: Diagnosis not present

## 2014-03-25 DIAGNOSIS — IMO0002 Reserved for concepts with insufficient information to code with codable children: Secondary | ICD-10-CM | POA: Diagnosis not present

## 2014-03-25 DIAGNOSIS — M170 Bilateral primary osteoarthritis of knee: Secondary | ICD-10-CM

## 2014-03-25 MED ORDER — DICLOFENAC POTASSIUM 50 MG PO TABS: 50.0000 mg | ORAL_TABLET | Freq: Three times a day (TID) | ORAL | Status: DC

## 2014-03-25 NOTE — Assessment & Plan Note (Signed)
BP Readings from Last 3 Encounters:  03/25/14 172/105  03/25/14 120/84  02/25/14 152/90    Lab Results  Component Value Date   NA 139 02/21/2014   K 4.0 02/25/2014   CREATININE 1.0 02/21/2014    Assessment: Blood pressure control: moderately elevated Progress toward BP goal:  deteriorated Comments: evaluated this morning at Cardiology office, bp at that time 120/84  Plan: Medications:  continue current medications Educational resources provided:   Self management tools provided:   Other plans: will defer antihypertensive adjustments today given goal bp measured this morning and pt in pain currently

## 2014-03-25 NOTE — Patient Instructions (Signed)
General Instructions:  We will prescribe diclofenac for your pain. We will refer you to the surgeon for your breast. Please bring your medicines with you each time you come.   Medicines may be  Eye drops  Herbal   Vitamins  Pills  Seeing these help [name] take care of you.   Treatment Goals:  Goals (1 Years of Data) as of 03/25/14         As of Today As of Today 02/25/14 02/25/14 02/25/14     Blood Pressure    . Blood Pressure < 140/90  172/105 120/84 152/90 149/97 136/76      Progress Toward Treatment Goals:  Treatment Goal 03/25/2014  Blood pressure deteriorated  Stop smoking smoking the same amount  Prevent falls -    Self Care Goals & Plans:  Self Care Goal 03/25/2014  Manage my medications take my medicines as prescribed; bring my medications to every visit; refill my medications on time  Eat healthy foods eat baked foods instead of fried foods; eat foods that are low in salt  Be physically active -  Stop smoking (No Data)    No flowsheet data found.   Care Management & Community Referrals:  Referral 10/22/2013  Referrals made for care management support none needed    Diclofenac potassium immediate-release capsules What is this medicine? DICLOFENAC (dye KLOE fen ak) is a non-steroidal anti-inflammatory drug (NSAID). It is used to reduce swelling and to treat pain. It may be used to treat osteoarthritis, rheumatoid arthritis, mild to moderate pain, and painful monthly periods. This medicine may be used for other purposes; ask your health care provider or pharmacist if you have questions. COMMON BRAND NAME(S): Zipsor What should I tell my health care provider before I take this medicine? They need to know if you have any of these conditions: -asthma, especially aspirin sensitive asthma -coronary artery bypass graft (CABG) surgery within the past 2 weeks -drink more than 3 alcohol containing drinks a day -heart disease or circulation problems like heart  failure or leg edema (fluid retention) -high blood pressure -kidney disease -liver disease -stomach bleeding or ulcers -an unusual or allergic reaction to diclofenac, aspirin, other NSAIDs, bovine or cow proteins, other medicines, foods, dyes, or preservatives -pregnant or trying to get pregnant -breast-feeding How should I use this medicine? Take this medicine by mouth with food and a full glass of water. Follow the directions on the prescription label. Take your medicine at regular intervals. Do not take your medicine more often than directed. Long-term, continuous use may increase the risk of heart attack or stroke. A special MedGuide will be given to you by the pharmacist with each prescription and refill. Be sure to read this information carefully each time. Talk to your pediatrician regarding the use of this medicine in children. Special care may be needed. Elderly patients over 42 years old may have a stronger reaction and need a smaller dose. Overdosage: If you think you've taken too much of this medicine contact a poison control center or emergency room at once. Overdosage: If you think you have taken too much of this medicine contact a poison control center or emergency room at once. NOTE: This medicine is only for you. Do not share this medicine with others. What if I miss a dose? If you miss a dose, take it as soon as you can. If it is almost time for your next dose, take only that dose. Do not take double or extra doses. What may  interact with this medicine? Do not take this medicine with any of the following medications: -cidofovir -ketorolac -methotrexate This medicine may also interact with the following medications: -alcohol -aspirin and aspirin-like medicines -cyclosporine -diuretics -lithium -medicines for blood pressure -medicines for osteoporosis -medicines that affect platelets -medicines that treat or prevent blood clots like warfarin -NSAIDs, medicines for  pain and inflammation, like ibuprofen or naproxen -pemetrexed -steroid medicines like prednisone or cortisone This list may not describe all possible interactions. Give your health care provider a list of all the medicines, herbs, non-prescription drugs, or dietary supplements you use. Also tell them if you smoke, drink alcohol, or use illegal drugs. Some items may interact with your medicine. What should I watch for while using this medicine? Tell your doctor or health care professional if your pain does not get better. Talk to your doctor before taking another medicine for pain. Do not treat yourself. This medicine does not prevent heart attack or stroke. In fact, this medicine may increase the chance of a heart attack or stroke. The chance may increase with longer use of this medicine and in people who have heart disease. If you take aspirin to prevent heart attack or stroke, talk with your doctor or health care professional. Do not take medicines such as ibuprofen and naproxen with this medicine. Side effects such as stomach upset, nausea, or ulcers may be more likely to occur. Many medicines available without a prescription should not be taken with this medicine. This medicine can cause ulcers and bleeding in the stomach and intestines at any time during treatment. Do not smoke cigarettes or drink alcohol. These increase irritation to your stomach and can make it more susceptible to damage from this medicine. Ulcers and bleeding can happen without warning symptoms and can cause death. You may get drowsy or dizzy. Do not drive, use machinery, or do anything that needs mental alertness until you know how this medicine affects you. Do not stand or sit up quickly, especially if you are an older patient. This reduces the risk of dizzy or fainting spells. This medicine can cause you to bleed more easily. Try to avoid damage to your teeth and gums when you brush or floss your teeth. What side effects may I  notice from receiving this medicine? Side effects that you should report to your doctor or health care professional as soon as possible: -allergic reactions like skin rash, itching or hives, swelling of the face, lips, or tongue -black or bloody stools, blood in the urine or vomit -blurred vision -chest pain -difficulty breathing or wheezing -nausea or vomiting -rash or fever -redness, blistering, peeling or loosening of the skin, including inside the mouth -slurred speech or weakness on one side of the body -unexplained weight gain or swelling -unusually weak or tired -yellowing of eyes or skin Side effects that usually do not require medical attention (Report these to your doctor or health care professional if they continue or are bothersome.): -constipation -diarrhea -dizziness -headache -heartburn This list may not describe all possible side effects. Call your doctor for medical advice about side effects. You may report side effects to FDA at 1-800-FDA-1088. Where should I keep my medicine? Keep out of the reach of children. Store at room temperature between 15 and 30 degrees C (59 and 86 degrees F). Protect from moisture. Throw away any unused medicine after the expiration date. NOTE: This sheet is a summary. It may not cover all possible information. If you have questions about this medicine,  talk to your doctor, pharmacist, or health care provider.  2014, [employer] Standard. (2009-05-05 14:37:44)

## 2014-03-25 NOTE — Assessment & Plan Note (Signed)
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 Referral to Dr. Brantley Stage for partial mastectomy of right breast and left lumpectomy of left per patient.

## 2014-03-25 NOTE — Assessment & Plan Note (Signed)
Requesting Tramadol.  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 -diclofenac 50 mg q8h

## 2014-03-25 NOTE — Progress Notes (Signed)
   Subjective:    Patient ID: Alisha [name], female    DOB: 10-Sep-1961, 53 y.o.   MRN: [id number]  HPI  Pt with hx of breast fibroadenomas presents for referral to General Surgery for breast surgery.  Has been cleared today by Cardiology.  Requesting pain medicine. Denies fever, chest pain, shortness of breath but does endorse bilateral chest pain and feeling as though "they have fever in them" when referring to her breast. She is accompanied by her "CNA" today.  Review of Systems  Constitutional: Negative.   HENT: Negative.   Eyes: Negative.   Respiratory: Negative for cough and shortness of breath.   Cardiovascular: Negative for chest pain and leg swelling.  Gastrointestinal: Negative for nausea, vomiting, diarrhea and constipation.  Genitourinary: Negative.   Musculoskeletal:       Bilateral breast discomfort  Skin: Negative.   Neurological: Negative.   Hematological: Does not bruise/bleed easily.       Objective:   Physical Exam  Constitutional: She appears well-developed and well-nourished. No distress.  HENT:  Head: Normocephalic and atraumatic.  Eyes: Conjunctivae and EOM are normal. Pupils are equal, round, and reactive to light.  Neck: Normal range of motion.  Cardiovascular: Normal rate.   Pulmonary/Chest: Effort normal and breath sounds normal.  Musculoskeletal: She exhibits tenderness.  Neurological: She is alert.  Skin: Skin is warm and dry.  Psychiatric: She has a normal mood and affect.          Assessment & Plan:  See problem-list charting for detailed A/P:  1. Breast lump: referral to Gen Surg 2: chronic pain, osteoarthritis: diclofenac  50 mg q8h 3. Htn: elevated likely secondary to pain

## 2014-03-25 NOTE — Progress Notes (Signed)
Alisha [name] Date of Birth: 1961-03-22 Medical Record #[id number]  History of Present Illness: Alisha [name] is seen back today for a post cath visit. Seen for Dr. Acie Fredrickson. She is a 53 year old female with abnormal EKG, PVD, prior DVT - committed to life long anticoagulation, depression, anxiety, HTN, tobacco abuse,  and prior breast mass.  Referred here in January for pre op visit due to her abnormal EKG. Needing bilateral breast surgery. Noted to be quite sleepy at the time of that exam. Had Myoview which led to cath. This was basically ok - 20% disease in the LAD.  Comes back today. Here with her husband. No chest pain. Still smoking. Anxious to get on with surgery. No problems with her cath site.   Current Outpatient Prescriptions  Medication Sig Dispense Refill  . albuterol (PROAIR HFA) 108 (90 BASE) MCG/ACT inhaler Inhale 2 puffs into the lungs every 6 (six) hours as needed for wheezing. Take 1-2 puffs as needed every 4-6 hours as needed for shortness of breath  1 Inhaler  3  . amLODipine (NORVASC) 10 MG tablet Take 1 tablet (10 mg total) by mouth daily.  30 tablet  11  . aspirin 81 MG tablet Take 1 tablet (81 mg total) by mouth daily.  100 tablet  3  . atorvastatin (LIPITOR) 40 MG tablet Take 1 tablet (40 mg total) by mouth at bedtime.  90 tablet  3  . buPROPion (WELLBUTRIN XL) 150 MG 24 hr tablet Take 150 mg by mouth daily.      . carvedilol (COREG) 25 MG tablet Take 1 tablet (25 mg total) by mouth 2 (two) times daily with a meal.  180 tablet  3  . diazepam (VALIUM) 5 MG tablet Take 10 mg by mouth 3 (three) times daily.       . DULoxetine (CYMBALTA) 60 MG capsule Take 60 mg by mouth. Take 1 capsule in the morning and 1 capsule at bedtime       . gabapentin (NEURONTIN) 800 MG tablet TAKE 1 TABLET BY MOUTH IN THE MORNING, 1 TABLET BY MOUTH MIDDAY, AND 2 TABLETS AT BEDTIME  120 tablet  3  . hydrOXYzine (ATARAX/VISTARIL) 25 MG tablet Take 25 mg by mouth 3 (three) times daily as needed for  itching.       Marland Kitchen lisinopril-hydrochlorothiazide (PRINZIDE,ZESTORETIC) 20-12.5 MG per tablet Take 2 tablets by mouth daily.  60 tablet  5  . naproxen (NAPROSYN) 500 MG tablet Take 1 tablet (500 mg total) by mouth 2 (two) times daily with a meal.  60 tablet  0  . potassium chloride (KLOR-CON 10) 10 MEQ tablet Take 1 tablet (10 mEq total) by mouth daily.  30 tablet  1  . traZODone (DESYREL) 100 MG tablet Take 300 mg by mouth at bedtime.       . ziprasidone (GEODON) 60 MG capsule Take 60 mg by mouth 2 (two) times daily with a meal.        No current facility-administered medications for this visit.    Allergies  Allergen Reactions  . Propoxyphene N-Acetaminophen Swelling    Past Medical History  Diagnosis Date  . DVT (deep venous thrombosis)     bilateral, 2 episodes: Requires lifelong therapy  . Chronic pain syndrome   . Restless leg syndrome   . Depression   . Hyperlipidemia   . Hypertension   . PVD (peripheral vascular disease)     s/p L fem-pop bypass 11/21/08; graft occluded 12/28/08;  aortogram  w/ bilat LE runoff: 1.  bilat diffuse SFA occlusive dz, 2.  Mod to severe above-knee popliteal dz,  3.  Bilat 3-vessel runoff w/ mild tibial occlusive dz.  . Tobacco abuse   . Degenerative joint disease of knee   . Breast lump 02/2008    Biopsy 05/2008: showed no evidence of malignancy  . Irregular menses 9/08  . Pulmonary edema   . Anxiety   . Joint pain   . Mixed stress and urge urinary incontinence     [facility], underwent cystoscopy & uroflowmetry   . Endometrial mass 10/12/2012    Endometrium, biopsy on 11/04/12 - PROLIFERATIVE ENDOMETRIUM AND ABUNDANT MUCUS. NO HYPERPLASIA OR CARCINOMA.   . Sigmoid diverticulitis 10/26/2012  . Asthma   . Breast mass in female     bi lat     Past Surgical History  Procedure Laterality Date  . Femoral bypass  11/09  .  right external iliac artery stent    . Right breast needle-localized lumpectomy    . Pr vein bypass  graft,aorto-fem-pop    . Carotid endarterectomy    . Tubal ligation    . Tonsillectomy    . Cardiac catheterization      History  Smoking status  . Current Every Day Smoker -- 0.25 packs/day for 18 years  . Types: Cigarettes  Smokeless tobacco  . Never Used    Comment: 3-6 per day    History  Alcohol Use  . Yes    Comment: rare    Family History  Problem Relation Age of Onset  . Breast cancer Mother   . Colon cancer Neg Hx   . Rectal cancer Neg Hx   . Stomach cancer Neg Hx   . Breast cancer Other     GREAT AUNT    Review of Systems: The review of systems is per the HPI.  All other systems were reviewed and are negative.  Physical Exam: BP 120/84  Pulse 72  Ht 5' 4.5" (1.638 m)  Wt 205 lb 1.9 oz (93.042 kg)  BMI 34.68 kg/m2 Patient is very pleasant and in no acute distress. She is obese. Skin is warm and dry. Color is normal.  HEENT is unremarkable but with poor dentition. Normocephalic/atraumatic. PERRL. Sclera are nonicteric. Neck is supple. No masses. No JVD. Lungs are clear. Cardiac exam shows a regular rate and rhythm. Abdomen is soft. Extremities are without edema. Gait and ROM are intact. No gross neurologic deficits noted. Right wrist ok.   LABORATORY DATA: Lab Results  Component Value Date   WBC 9.1 02/21/2014   HGB 14.6 02/21/2014   HCT 44.5 02/21/2014   PLT 183.0 02/21/2014   GLUCOSE 155* 02/21/2014   CHOL 162 10/01/2011   TRIG 205* 10/01/2011   HDL 25* 10/01/2011   LDLCALC 96 10/01/2011   ALT 20 01/27/2014   AST 16 01/27/2014   NA 139 02/21/2014   K 4.0 02/25/2014   CL 103 02/21/2014   CREATININE 1.0 02/21/2014   BUN 17 02/21/2014   CO2 26 02/21/2014   TSH 3.570 10/12/2012   INR 2.10 03/14/2014   HGBA1C  Value: 6.5 (NOTE)  According to the ADA Clinical Practice Recommendations for 2011, when HbA1c is used as a screening test:   >=6.5%   Diagnostic of Diabetes Mellitus           (if abnormal  result  is confirmed)  5.7-6.4%   Increased risk of developing Diabetes Mellitus  References:Diagnosis and Classification of Diabetes Mellitus,Diabetes NFAO,1308,65(HQION 1):S62-S69 and Standards of Medical Care in         Diabetes - 2011,Diabetes GEXB,2841,32  (Suppl 1):S11-S61.* 01/20/2011   MyoviewImpression  Exercise Capacity: Lexiscan with low level exercise.  BP Response: Normal blood pressure response.  Clinical Symptoms: Calves and thighs tight  ECG Impression: No significant ST segment change suggestive of ischemia.  Comparison with Prior Nuclear Study: No images to compare  Overall Impression: Low risk nuclear scan. There may be very slight area of scar in the mid/apical inferior wall. There may be very slight ischemia. Ejection fraction is similar to history in the past.  LV Ejection Fraction: 47%. LV Wall Motion: No focal wall motion abnormalities.  Dola Argyle, MD    Procedure: Left Heart Cath, Selective Coronary Angiography, LV angiography  Indication: 53 yo BF with history of PAD and tobacco abuse was seen for preoperative evaluation for breast CA. Ecg was abnormal and Myoview was abnormal.  Procedural Details: The right wrist was prepped, draped, and anesthetized with 1% lidocaine. Using the modified Seldinger technique, a 5 Evangelene sheath was introduced into the right radial artery. 3 mg of verapamil was administered through the sheath, weight-based unfractionated heparin was administered intravenously. Standard Judkins catheters were used for selective coronary angiography and left ventriculography. Catheter exchanges were performed over an exchange length guidewire. There were no immediate procedural complications. A TR band was used for radial hemostasis at the completion of the procedure. The patient was transferred to the post catheterization recovery area for further monitoring.  Procedural Findings:  Hemodynamics:  AO 161/85 mean 116 mm Hg  LV 163/19 mm Hg  Coronary  angiography:  Coronary dominance: right  Left mainstem: Normal.  Left anterior descending (LAD): Moderate calcification in the proximal with diffuse 20% disease.  Left circumflex (LCx): Gives rise to 2 large OM branches. Normal.  Right coronary artery (RCA): Normal.  Left ventriculography: Left ventricular systolic function is normal, LVEF is estimated at 55-65%, there is no significant mitral regurgitation  Final Conclusions:  1. Nonobstructive CAD  2. Normal LV function.  Recommendations: risk factor modification. Patient is low risk for planned breast surgery.  Collier [facility]ina [facility]  02/25/2014, 1:43 PM   Assessment / Plan: 1. Post cath - mild CAD - nonobstructive - CV risk factor management encouraged. I have called [facility] to let them know she is cleared to proceed with surgery from our standpoint. "Sonya" with [facility] advises referral from PCP again and then their office will contact the patient to arrange for a pre op visit with Dr. Brantley Stage. She is seeing PCP later today.   2. CV risk factors - modification encouraged.  See back prn.  Patient is agreeable to this plan and will call if any problems develop in the interim.   Burtis Junes, RN, [place] [address] [place], [place]  [address] [phone]

## 2014-03-25 NOTE — Patient Instructions (Signed)
Stay on your current medicines  Try to stop smoking  We will see you back as needed  I have called Dr. Josetta Huddle office - you will need a follow up visit with him to discuss your surgery again - please call your primary doctor to get a referral sent over - Dr. Josetta Huddle office will then call you with an appointment time.  Call the [place] office at [phone] if you have any questions, problems or concerns.

## 2014-03-25 NOTE — Assessment & Plan Note (Signed)
Requesting Tramadol.  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 -diclofenac 50 mg q8h 

## 2014-03-28 NOTE — Progress Notes (Signed)
Case discussed with Dr. Schooler soon after the resident saw the patient.  We reviewed the resident's history and exam and pertinent patient test results.  I agree with the assessment, diagnosis, and plan of care documented in the resident's note. 

## 2014-04-04 ENCOUNTER — Ambulatory Visit (INDEPENDENT_AMBULATORY_CARE_PROVIDER_SITE_OTHER): Payer: Medicare Other | Admitting: Pharmacist

## 2014-04-04 DIAGNOSIS — I82409 Acute embolism and thrombosis of unspecified deep veins of unspecified lower extremity: Secondary | ICD-10-CM

## 2014-04-04 DIAGNOSIS — Z7901 Long term (current) use of anticoagulants: Secondary | ICD-10-CM | POA: Diagnosis not present

## 2014-04-04 DIAGNOSIS — N63 Unspecified lump in unspecified breast: Secondary | ICD-10-CM | POA: Diagnosis not present

## 2014-04-04 LAB — POCT INR: INR: 2.2

## 2014-04-04 NOTE — Progress Notes (Signed)
Indication: Recurrent deep venous thrombosis. Duration: Lifelong. INR: At target. Agree with Dr. Groce's assessment and plan. 

## 2014-04-04 NOTE — Patient Instructions (Signed)
Patient instructed to take medications as defined in the Anti-coagulation Track section of this encounter.  Patient instructed to take today's dose.  Patient verbalized understanding of these instructions.    

## 2014-04-04 NOTE — Progress Notes (Signed)
Anti-Coagulation Progress Note  Alisha [name] is a 53 y.o. female who is currently on an anti-coagulation regimen.    RECENT RESULTS: Recent results are below, the most recent result is correlated with a dose of 67.5 mg. per week: Lab Results  Component Value Date   INR 2.20 04/04/2014   INR 2.10 03/14/2014   INR 1.30 02/28/2014    ANTI-COAG DOSE: Anticoagulation Dose Instructions as of 04/04/2014     Sun Mon Tue Wed Thu Fri Sat   New Dose 7.5 mg 11.25 mg 11.25 mg 11.25 mg 7.5 mg 11.25 mg 7.5 mg       ANTICOAG SUMMARY: Anticoagulation Episode Summary   Current INR goal 2.0-3.0  Next INR check 04/18/2014  INR from last check 2.20 (04/04/2014)  Weekly max dose   Target end date   [facility]  Preferred lab   Send INR reminders to [facility]AG [facility]   Indications  DVT [453.40] Long term current use of anticoagulant [V58.61]        Comments       Anticoagulation Care Providers   Provider Role Specialty Phone number   Burman Freestone, MD  Internal Medicine [phone]      ANTICOAG TODAY: Anticoagulation Summary as of 04/04/2014   INR goal 2.0-3.0  Selected INR 2.20 (04/04/2014)  Next INR check 04/18/2014  Target end date    Indications  DVT [453.40] Long term current use of anticoagulant [V58.61]      Anticoagulation Episode Summary   [facility]   Preferred lab    Send INR reminders to ANTICOAG IMP   Comments     Anticoagulation Care Providers   Provider Role Specialty Phone number   Burman Freestone, MD  Internal Medicine [phone]      PATIENT INSTRUCTIONS: Patient Instructions  Patient instructed to take medications as defined in the Anti-coagulation Track section of this encounter.  Patient instructed to take today's dose.  Patient verbalized understanding of these instructions.       FOLLOW-UP Return in 2 weeks (on 04/18/2014) for Follow up INR at 1100h.  Jorene Guest, III Pharm.D., CACP

## 2014-04-11 ENCOUNTER — Encounter (INDEPENDENT_AMBULATORY_CARE_PROVIDER_SITE_OTHER): Payer: Self-pay | Admitting: Surgery

## 2014-04-11 ENCOUNTER — Other Ambulatory Visit (INDEPENDENT_AMBULATORY_CARE_PROVIDER_SITE_OTHER): Payer: Self-pay | Admitting: Surgery

## 2014-04-11 ENCOUNTER — Ambulatory Visit (INDEPENDENT_AMBULATORY_CARE_PROVIDER_SITE_OTHER): Payer: Medicare Other | Admitting: Surgery

## 2014-04-11 VITALS — BP 128/84 | HR 84 | Resp 16 | Ht 64.0 in | Wt 196.2 lb

## 2014-04-11 DIAGNOSIS — N63 Unspecified lump in unspecified breast: Secondary | ICD-10-CM

## 2014-04-11 NOTE — Patient Instructions (Signed)
Lumpectomy A lumpectomy is a form of "breast conserving" or "breast preservation" surgery. It may also be referred to as a partial mastectomy. During a lumpectomy, the portion of the breast that contains the cancerous tumor or breast mass (the lump) is removed. Some normal tissue around the lump may also be removed to make sure all the tumor has been removed. This surgery should take 40 minutes or less. LET YOUR HEALTH CARE PROVIDER KNOW ABOUT:  Any allergies you have.  All medicines you are taking, including vitamins, herbs, eye drops, creams, and over-the-counter medicines.  Previous problems you or members of your family have had with the use of anesthetics.  Any blood disorders you have.  Previous surgeries you have had.  Medical conditions you have. RISKS AND COMPLICATIONS Generally, this is a safe procedure. However, as with any procedure, complications can occur. Possible complications include:  Bleeding.  Infection.  Pain.  Temporary swelling.  Change in the shape of the breast, particularly if a large portion is removed. BEFORE THE PROCEDURE  Ask your health care provider about changing or stopping your regular medicines.  Do not eat or drink anything for 7 8 hours before the surgery or as directed by your health care provider. Ask your health care provider if you can take a sip of water with any approved medicines.  On the day of surgery, your healthcare provider will use a mammogram or ultrasound to locate and mark the tumor in your breast. These markings on your breast will show where the cut (incision) will be made. PROCEDURE   An IV tube will be put into one of your veins.  You may be given medicine to help you relax before the surgery (sedative). You will be given one of the following:  A medicine that numbs the area (local anesthesia).  A medicine that makes you go to sleep (general anesthesia).  Your health care provider will use a kind of electric scalpel  that uses heat to minimize bleeding (electrocautery knife).  A curved incision (like a smile or frown) that follows the natural curve of your breast is made, to allow for minimal scarring and better healing.  The tumor will be removed with some of the surrounding tissue. This will be sent to the lab for analysis. Your health care provider may also remove your lymph nodes at this time if needed.  Sometimes, but not always, a rubber tube called a drain will be surgically inserted into your breast area or armpit to collect excess fluid that may accumulate in the space where the tumor was. This drain is connected to a plastic bulb on the outside of your body. This drain creates suction to help remove the fluid.  The incisions will be closed with stitches (sutures).  A bandage may be placed over the incisions. AFTER THE PROCEDURE  You will be taken to the recovery area.  You will be given medicine for pain.  A small rubber drain may be placed in the breast for 2 3 days to prevent a collection of blood (hematoma) from developing in the breast. You will be given instructions on caring for the drain before you go home.  A pressure bandage (dressing) will be applied for 1 2 days to prevent bleeding. Ask your health care provider how to care for your bandage at home. Document Released: 01/27/2007 Document Revised: 08/18/2013 Document Reviewed: 05/21/2013 [employer] Patient Information 2014 [employer].  

## 2014-04-11 NOTE — Progress Notes (Signed)
Patient ID: Alisha [name], female   DOB: 12/01/1961, 52 y.o.   MRN: [id number]  No chief complaint on file.   HPI Alisha [name] is a 52 y.o. female.  Patient presents with chief complaint of bilateral breast lumps. Patient has history of fibroadenoma and underwent needle localization with excision 2009. She is developed 2 lumps one in each breast and they are centrally located. Each is very tender. She underwent ultrasound-guided core biopsy of the findings consistent with fibroadenoma. Her causing discomfort the patient would like to have these removed. HPI  Past Medical History  Diagnosis Date  . DVT (deep venous thrombosis)     bilateral, 2 episodes: Requires lifelong therapy  . Chronic pain syndrome   . Restless leg syndrome   . Depression   . Hyperlipidemia   . Hypertension   . PVD (peripheral vascular disease)     s/p L fem-pop bypass 11/21/08; graft occluded 12/28/08;  aortogram w/ bilat LE runoff: 1.  bilat diffuse SFA occlusive dz, 2.  Mod to severe above-knee popliteal dz,  3.  Bilat 3-vessel runoff w/ mild tibial occlusive dz.  . Tobacco abuse   . Degenerative joint disease of knee   . Breast lump 02/2008    Biopsy 05/2008: showed no evidence of malignancy  . Irregular menses 9/08  . Pulmonary edema   . Anxiety   . Joint pain   . Mixed stress and urge urinary incontinence     [facility], underwent cystoscopy & uroflowmetry   . Endometrial mass 10/12/2012    Endometrium, biopsy on 11/04/12 - PROLIFERATIVE ENDOMETRIUM AND ABUNDANT MUCUS. NO HYPERPLASIA OR CARCINOMA.   . Sigmoid diverticulitis 10/26/2012  . Asthma   . Breast mass in female     bi lat     Past Surgical History  Procedure Laterality Date  . Femoral bypass  11/09  .  right external iliac artery stent    . Right breast needle-localized lumpectomy    . Pr vein bypass graft,aorto-fem-pop    . Carotid endarterectomy    . Tubal ligation    . Tonsillectomy    . Cardiac catheterization       Family History  Problem Relation Age of Onset  . Breast cancer Mother   . Colon cancer Neg Hx   . Rectal cancer Neg Hx   . Stomach cancer Neg Hx   . Breast cancer Other     GREAT AUNT    Social History History  Substance Use Topics  . Smoking status: Current Every Day Smoker -- 0.25 packs/day for 18 years    Types: Cigarettes  . Smokeless tobacco: Never Used     Comment: 3-6 per day  . Alcohol Use: Yes     Comment: rare    Allergies  Allergen Reactions  . Propoxyphene N-Acetaminophen Swelling    Current Outpatient Prescriptions  Medication Sig Dispense Refill  . albuterol (PROAIR HFA) 108 (90 BASE) MCG/ACT inhaler Inhale 2 puffs into the lungs every 6 (six) hours as needed for wheezing. Take 1-2 puffs as needed every 4-6 hours as needed for shortness of breath  1 Inhaler  3  . amLODipine (NORVASC) 10 MG tablet Take 1 tablet (10 mg total) by mouth daily.  30 tablet  11  . aspirin 81 MG tablet Take 1 tablet (81 mg total) by mouth daily.  100 tablet  3  . atorvastatin (LIPITOR) 40 MG tablet Take 1 tablet (40 mg total) by mouth at bedtime.    90 tablet  3  . buPROPion (WELLBUTRIN XL) 150 MG 24 hr tablet Take 150 mg by mouth daily.      . carvedilol (COREG) 25 MG tablet Take 1 tablet (25 mg total) by mouth 2 (two) times daily with a meal.  180 tablet  3  . diazepam (VALIUM) 5 MG tablet Take 10 mg by mouth 3 (three) times daily.       . diclofenac (CATAFLAM) 50 MG tablet Take 1 tablet (50 mg total) by mouth 3 (three) times daily.  90 tablet  3  . DULoxetine (CYMBALTA) 60 MG capsule Take 60 mg by mouth. Take 1 capsule in the morning and 1 capsule at bedtime       . gabapentin (NEURONTIN) 800 MG tablet TAKE 1 TABLET BY MOUTH IN THE MORNING, 1 TABLET BY MOUTH MIDDAY, AND 2 TABLETS AT BEDTIME  120 tablet  3  . hydrOXYzine (ATARAX/VISTARIL) 25 MG tablet Take 25 mg by mouth 3 (three) times daily as needed for itching.       Marland Kitchen lisinopril-hydrochlorothiazide (PRINZIDE,ZESTORETIC)  20-12.5 MG per tablet Take 2 tablets by mouth daily.  60 tablet  5  . naproxen (NAPROSYN) 500 MG tablet Take 1 tablet (500 mg total) by mouth 2 (two) times daily with a meal.  60 tablet  0  . potassium chloride (KLOR-CON 10) 10 MEQ tablet Take 1 tablet (10 mEq total) by mouth daily.  30 tablet  1  . traZODone (DESYREL) 100 MG tablet Take 300 mg by mouth at bedtime.       . ziprasidone (GEODON) 60 MG capsule Take 60 mg by mouth 2 (two) times daily with a meal.        No current facility-administered medications for this visit.    Review of Systems Review of Systems  Constitutional: Negative.   HENT: Negative.   Eyes: Negative.   Respiratory: Negative.   Cardiovascular: Negative for leg swelling.  Endocrine: Negative.   Musculoskeletal: Positive for arthralgias, back pain and myalgias.  Allergic/Immunologic: Negative.   Neurological: Negative.   Hematological: Negative.   Psychiatric/Behavioral: Negative.     Blood pressure 128/84, pulse 84, resp. rate 16, height 5\' 4"  (1.626 m), weight 196 lb 3.2 oz (88.996 kg), last menstrual period 03/06/2014.  Physical Exam Physical Exam  Constitutional: She appears well-developed and well-nourished.  HENT:  Head: Normocephalic and atraumatic.  Eyes: Pupils are equal, round, and reactive to light. No scleral icterus.  Neck: Normal range of motion. Neck supple.  Cardiovascular: Normal rate and regular rhythm.   Pulmonary/Chest:    Musculoskeletal: Normal range of motion.  Neurological: She is alert.  Skin: Skin is warm and dry.  Psychiatric: She has a normal mood and affect. Her behavior is normal. Thought content normal.    Data Reviewed HPI: The patient returns for a followup after a ultrasound-guided  biopsies of bilateral breast masses 12/15/2013. The patient is doing  well and denies any biopsy site complications other than bruising  and tenderness.  Pathology results: Right breast pathology reveals densely hyalinized  stroma  which may be related to a hyalinized fibroadenoma and left  breast pathology reveals a fibroadenoma. This is concordant with the  imaging findings.  Biopsy site: Clean, dry, and intact. Steri-Strips have been removed  and bandages with 4x4s overlie the biopsy site.  PLAN: All the patient's questions were answered. She is aware of the  results and demonstrates understanding. The patient is scheduled to  follow-up with Dr. Brantley Stage to discuss  the possibility of surgical  excision on 01/10/2014 at 10:30 a.m.   Assessment    Bilateral breast fibroadenoma painful Patient Active Problem List   Diagnosis Date Noted  . Abnormal ECG 01/28/2014  . Encounter for screening colonoscopy 01/12/2014  . Health care maintenance 09/16/2013  . PVD (peripheral vascular disease) 09/02/2013  . Atherosclerosis of native arteries of the extremities with intermittent claudication 09/02/2013  . Pain in limb 09/02/2013  . Sigmoid diverticulitis 10/26/2012  . Metrorrhagia 10/12/2012  . Endometrial mass 10/12/2012  . 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 Prolapse of female pelvic organs 05/08/2012  . Osteoarthritis of both knees 02/15/2012  . Long term current use of anticoagulant 01/20/2011  . Chronic pain syndrome 07/05/2010  . RESTLESS LEG SYNDROME 02/27/2010  . DVT 02/22/2010  . VISUAL ACUITY, DECREASED 01/12/2010  . THYROID STIMULATING HORMONE, ABNORMAL 09/25/2009  . DEPRESSION 07/17/2009  . UNSPECIFIED DISORDER TEETH&SUPPORTING STRUCTURES 01/31/2009  . 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 TOBACCO ABUSE 10/20/2006  . HYPERTENSION 10/20/2006  . PERIPHERAL VASCULAR DISEASE 10/20/2006  . HYPERLIPIDEMIA, MIXED 10/10/2006      Plan    Patient like to have those removed due to discomfort. We'll need to hold Coumadin 5 days before procedure.The procedure has been discussed with the patient. Seed localized bilateral lumpectomy discussed.  The procedure has been discussed with the  patient. Alternatives to surgery have been discussed with the patient.  Risks of surgery include bleeding,  Infection,  Seroma formation, death,  and the need for further surgery.   The patient understands and wishes to proceed.Alternatives to surgery have been discussed with the patient.  Risks of surgery include bleeding,  Infection,  Seroma formation, death,  and the need for further surgery.   The patient understands and wishes to proceed.       Teagen Mcleary A. Chalsea Darko 04/11/2014, 2:53 PM    

## 2014-04-14 ENCOUNTER — Telehealth: Payer: Self-pay | Admitting: Pharmacist

## 2014-04-14 ENCOUNTER — Other Ambulatory Visit: Payer: Self-pay | Admitting: Internal Medicine

## 2014-04-14 NOTE — Telephone Encounter (Signed)
Re-scheduled anticoagulation management clinic appointment from April 18, 2014 to April 25, 2014--same time. Spoke with patient.  

## 2014-04-18 ENCOUNTER — Ambulatory Visit: Payer: Medicare Other

## 2014-04-25 ENCOUNTER — Ambulatory Visit (INDEPENDENT_AMBULATORY_CARE_PROVIDER_SITE_OTHER): Payer: Medicare Other | Admitting: Pharmacist

## 2014-04-25 DIAGNOSIS — Z7901 Long term (current) use of anticoagulants: Secondary | ICD-10-CM | POA: Diagnosis not present

## 2014-04-25 DIAGNOSIS — I82409 Acute embolism and thrombosis of unspecified deep veins of unspecified lower extremity: Secondary | ICD-10-CM

## 2014-04-25 DIAGNOSIS — F333 Major depressive disorder, recurrent, severe with psychotic symptoms: Secondary | ICD-10-CM | POA: Diagnosis not present

## 2014-04-25 LAB — POCT INR: INR: 3.5

## 2014-04-25 NOTE — Patient Instructions (Signed)
Patient instructed to take medications as defined in the Anti-coagulation Track section of this encounter.  Patient instructed to OMIT todays dose. Take Tuesday and Wednesday's dose. Commencing on Thursday April 30th--STOP WARFARIN and do not take any warfarin on: Thursday April 30 Friday May 1 Saturday May 2 Sunday May 3 Monday May 4 Tuesday May 5 Surgeon will recommence warfarin after your surgical procedure. See me in 1 week after resuming warfarin.  Patient verbalized understanding of these instructions.

## 2014-04-25 NOTE — Progress Notes (Signed)
Anti-Coagulation Progress Note  Alisha [name] is a 53 y.o. female who is currently on an anti-coagulation regimen.    RECENT RESULTS: Recent results are below, the most recent result is correlated with a dose of 67.5 mg. per week:  She will OMIT todays dose (INR = 3.5 [target 2.0 - 3.0]); She will take Tuesday and Wednesdays doses. On Thursday April 30 through Tuesday May 5th, she will HOLD/OMIT warfarin (5 days OFF warfarin) in preparation for major surgery on Tuesday 5-MAY-15.  Lab Results  Component Value Date   INR 3.5 04/25/2014   INR 2.20 04/04/2014   INR 2.10 03/14/2014    ANTI-COAG DOSE: Anticoagulation Dose Instructions as of 04/25/2014     Dorene Grebe Tue Wed Thu Fri Sat   New Dose 7.5 mg 11.25 mg 11.25 mg 11.25 mg Hold Hold Hold    Description       Will continue to HOLD/OMIT doses on Sunday May 3; Monday May 4; Tuesday May 5 (date of surgery). Surgeon will recommence after surgery as appropriate. See me in 1 week after recommencing warfarin.        ANTICOAG SUMMARY: Anticoagulation Episode Summary   Current INR goal 2.0-3.0  Next INR check 05/09/2014  INR from last check 3.5! (04/25/2014)  Weekly max dose   Target end date   [facility]  Preferred lab   Send INR reminders to ANTICOAG IMP   Indications  DVT [453.40] Long term current use of anticoagulant [V58.61]        Comments       Anticoagulation Care Providers   Provider Role Specialty Phone number   Burman Freestone, MD  Internal Medicine [phone]      ANTICOAG TODAY: Anticoagulation Summary as of 04/25/2014   INR goal 2.0-3.0  Selected INR 3.5! (04/25/2014)  Next INR check 05/09/2014  Target end date    Indications  DVT [453.40] Long term current use of anticoagulant [V58.61]      Anticoagulation Episode Summary   [facility]   Preferred lab    Send INR reminders to ANTICOAG IMP   Comments     Anticoagulation Care Providers   Provider Role  Specialty Phone number   Burman Freestone, MD  Internal Medicine [phone]      PATIENT INSTRUCTIONS: Patient Instructions  Patient instructed to take medications as defined in the Anti-coagulation Track section of this encounter.  Patient instructed to OMIT todays dose. Take Tuesday and Wednesday's dose. Commencing on Thursday April 30th--STOP WARFARIN and do not take any warfarin on: Thursday April 30 Friday May 1 Saturday May 2 Sunday May 3 Monday May 4 Tuesday May 5 Surgeon will recommence warfarin after your surgical procedure. See me in 1 week after resuming warfarin.  Patient verbalized understanding of these instructions.       FOLLOW-UP Return in 2 weeks (on 05/09/2014) for Follow up INR at 1115h.  Jorene Guest, III Pharm.D., CACP

## 2014-04-27 ENCOUNTER — Encounter (HOSPITAL_BASED_OUTPATIENT_CLINIC_OR_DEPARTMENT_OTHER): Payer: Self-pay | Admitting: *Deleted

## 2014-04-27 ENCOUNTER — Other Ambulatory Visit: Payer: Self-pay | Admitting: Internal Medicine

## 2014-04-27 NOTE — Progress Notes (Signed)
Surgery r/s-had to have cardiac cath-done 2/15-mostly normal-on coumadin-will stop after today-will come in 05/02/14 for [facility] labs and pt

## 2014-04-29 ENCOUNTER — Other Ambulatory Visit (INDEPENDENT_AMBULATORY_CARE_PROVIDER_SITE_OTHER): Payer: Self-pay | Admitting: Surgery

## 2014-04-29 ENCOUNTER — Ambulatory Visit
Admission: RE | Admit: 2014-04-29 | Discharge: 2014-04-29 | Disposition: A | Discharge status: Home / Self Care | Payer: Medicare Other | Source: Ambulatory Visit | Attending: Surgery | Admitting: Surgery

## 2014-04-29 ENCOUNTER — Inpatient Hospital Stay: Admission: RE | Admit: 2014-04-29 | Payer: Medicare Other | Source: Ambulatory Visit

## 2014-04-29 DIAGNOSIS — N63 Unspecified lump in unspecified breast: Secondary | ICD-10-CM

## 2014-04-29 DIAGNOSIS — D249 Benign neoplasm of unspecified breast: Secondary | ICD-10-CM | POA: Diagnosis not present

## 2014-05-03 ENCOUNTER — Ambulatory Visit
Admission: RE | Admit: 2014-05-03 | Discharge: 2014-05-03 | Disposition: A | Discharge status: Home / Self Care | Payer: Medicare Other | Source: Ambulatory Visit | Attending: Surgery | Admitting: Surgery

## 2014-05-03 ENCOUNTER — Encounter (HOSPITAL_BASED_OUTPATIENT_CLINIC_OR_DEPARTMENT_OTHER): Payer: Self-pay

## 2014-05-03 ENCOUNTER — Ambulatory Visit (HOSPITAL_BASED_OUTPATIENT_CLINIC_OR_DEPARTMENT_OTHER): Payer: Medicare Other | Admitting: Anesthesiology

## 2014-05-03 ENCOUNTER — Encounter (HOSPITAL_BASED_OUTPATIENT_CLINIC_OR_DEPARTMENT_OTHER): Payer: Medicare Other | Admitting: Anesthesiology

## 2014-05-03 ENCOUNTER — Encounter (HOSPITAL_BASED_OUTPATIENT_CLINIC_OR_DEPARTMENT_OTHER)
Admission: RE | Disposition: A | Discharge status: Home / Self Care | Payer: Self-pay | Source: Ambulatory Visit | Attending: Surgery

## 2014-05-03 ENCOUNTER — Ambulatory Visit (HOSPITAL_BASED_OUTPATIENT_CLINIC_OR_DEPARTMENT_OTHER)
Admission: RE | Admit: 2014-05-03 | Discharge: 2014-05-03 | Disposition: A | Discharge status: Home / Self Care | Payer: Medicare Other | Source: Ambulatory Visit | Attending: Surgery | Admitting: Surgery

## 2014-05-03 DIAGNOSIS — G2581 Restless legs syndrome: Secondary | ICD-10-CM | POA: Insufficient documentation

## 2014-05-03 DIAGNOSIS — R920 Mammographic microcalcification found on diagnostic imaging of breast: Secondary | ICD-10-CM | POA: Insufficient documentation

## 2014-05-03 DIAGNOSIS — N63 Unspecified lump in unspecified breast: Secondary | ICD-10-CM

## 2014-05-03 DIAGNOSIS — F329 Major depressive disorder, single episode, unspecified: Secondary | ICD-10-CM | POA: Insufficient documentation

## 2014-05-03 DIAGNOSIS — F3289 Other specified depressive episodes: Secondary | ICD-10-CM | POA: Diagnosis not present

## 2014-05-03 DIAGNOSIS — F17200 Nicotine dependence, unspecified, uncomplicated: Secondary | ICD-10-CM | POA: Diagnosis not present

## 2014-05-03 DIAGNOSIS — I10 Essential (primary) hypertension: Secondary | ICD-10-CM | POA: Insufficient documentation

## 2014-05-03 DIAGNOSIS — Z888 Allergy status to other drugs, medicaments and biological substances status: Secondary | ICD-10-CM | POA: Diagnosis not present

## 2014-05-03 DIAGNOSIS — J45909 Unspecified asthma, uncomplicated: Secondary | ICD-10-CM | POA: Insufficient documentation

## 2014-05-03 DIAGNOSIS — I739 Peripheral vascular disease, unspecified: Secondary | ICD-10-CM | POA: Diagnosis not present

## 2014-05-03 DIAGNOSIS — N6489 Other specified disorders of breast: Secondary | ICD-10-CM | POA: Diagnosis not present

## 2014-05-03 DIAGNOSIS — F411 Generalized anxiety disorder: Secondary | ICD-10-CM | POA: Diagnosis not present

## 2014-05-03 DIAGNOSIS — G894 Chronic pain syndrome: Secondary | ICD-10-CM | POA: Insufficient documentation

## 2014-05-03 DIAGNOSIS — M1710 Unilateral primary osteoarthritis, unspecified knee: Secondary | ICD-10-CM | POA: Insufficient documentation

## 2014-05-03 DIAGNOSIS — Z7982 Long term (current) use of aspirin: Secondary | ICD-10-CM | POA: Insufficient documentation

## 2014-05-03 DIAGNOSIS — IMO0002 Reserved for concepts with insufficient information to code with codable children: Secondary | ICD-10-CM | POA: Insufficient documentation

## 2014-05-03 DIAGNOSIS — D249 Benign neoplasm of unspecified breast: Secondary | ICD-10-CM | POA: Diagnosis not present

## 2014-05-03 DIAGNOSIS — E785 Hyperlipidemia, unspecified: Secondary | ICD-10-CM | POA: Insufficient documentation

## 2014-05-03 DIAGNOSIS — Z79899 Other long term (current) drug therapy: Secondary | ICD-10-CM | POA: Diagnosis not present

## 2014-05-03 HISTORY — PX: BREAST LUMPECTOMY WITH RADIOACTIVE SEED LOCALIZATION: SHX6424

## 2014-05-03 LAB — CBC WITH DIFFERENTIAL/PLATELET
BASOS ABS: 0 10*3/uL (ref 0.0–0.1)
BASOS PCT: 1 % (ref 0–1)
Eosinophils Absolute: 0.2 10*3/uL (ref 0.0–0.7)
Eosinophils Relative: 2 % (ref 0–5)
HEMATOCRIT: 44.8 % (ref 36.0–46.0)
Hemoglobin: 15.3 g/dL — ABNORMAL HIGH (ref 12.0–15.0)
Lymphocytes Relative: 33 % (ref 12–46)
Lymphs Abs: 2.5 10*3/uL (ref 0.7–4.0)
MCH: 32.8 pg (ref 26.0–34.0)
MCHC: 34.2 g/dL (ref 30.0–36.0)
MCV: 96.1 fL (ref 78.0–100.0)
MONO ABS: 0.6 10*3/uL (ref 0.1–1.0)
Monocytes Relative: 8 % (ref 3–12)
NEUTROS ABS: 4.4 10*3/uL (ref 1.7–7.7)
Neutrophils Relative %: 56 % (ref 43–77)
Platelets: 185 10*3/uL (ref 150–400)
RBC: 4.66 MIL/uL (ref 3.87–5.11)
RDW: 12.7 % (ref 11.5–15.5)
WBC: 7.7 10*3/uL (ref 4.0–10.5)

## 2014-05-03 LAB — COMPREHENSIVE METABOLIC PANEL
ALT: 12 U/L (ref 0–35)
AST: 14 U/L (ref 0–37)
Albumin: 4 g/dL (ref 3.5–5.2)
Alkaline Phosphatase: 62 U/L (ref 39–117)
BUN: 18 mg/dL (ref 6–23)
CO2: 26 mEq/L (ref 19–32)
Calcium: 9.7 mg/dL (ref 8.4–10.5)
Chloride: 105 mEq/L (ref 96–112)
Creatinine, Ser: 1.03 mg/dL (ref 0.50–1.10)
GFR, EST AFRICAN AMERICAN: 72 mL/min — AB (ref >90)
GFR, EST NON AFRICAN AMERICAN: 62 mL/min — AB (ref >90)
GLUCOSE: 105 mg/dL — AB (ref 70–99)
POTASSIUM: 4.3 meq/L (ref 3.7–5.3)
Sodium: 144 mEq/L (ref 137–147)
TOTAL PROTEIN: 7.4 g/dL (ref 6.0–8.3)
Total Bilirubin: 0.3 mg/dL (ref 0.3–1.2)

## 2014-05-03 SURGERY — BREAST LUMPECTOMY WITH RADIOACTIVE SEED LOCALIZATION
Anesthesia: General | Site: Breast | Laterality: Bilateral

## 2014-05-03 MED ORDER — CEFAZOLIN SODIUM-DEXTROSE 2-3 GM-% IV SOLR
2.0000 g | INTRAVENOUS | Status: AC
  Administered 2014-05-03: 2 g via INTRAVENOUS

## 2014-05-03 MED ORDER — ONDANSETRON HCL 4 MG/2ML IJ SOLN
INTRAMUSCULAR | Status: AC
  Filled 2014-05-03: qty 2

## 2014-05-03 MED ORDER — MIDAZOLAM HCL 2 MG/2ML IJ SOLN
INTRAMUSCULAR | Status: AC
  Filled 2014-05-03: qty 2

## 2014-05-03 MED ORDER — ONDANSETRON HCL 4 MG/2ML IJ SOLN: 4.0000 mg | Freq: Once | INTRAMUSCULAR | Status: DC

## 2014-05-03 MED ORDER — CHLORHEXIDINE GLUCONATE 4 % EX LIQD: 1.0000 "application " | Freq: Once | CUTANEOUS | Status: DC

## 2014-05-03 MED ORDER — OXYCODONE HCL 5 MG PO TABS: 5.0000 mg | ORAL_TABLET | Freq: Once | ORAL | Status: DC | PRN

## 2014-05-03 MED ORDER — BUPIVACAINE-EPINEPHRINE (PF) 0.5% -1:200000 IJ SOLN
INTRAMUSCULAR | Status: AC
  Filled 2014-05-03: qty 30

## 2014-05-03 MED ORDER — LACTATED RINGERS IV SOLN
INTRAVENOUS | Status: DC
  Administered 2014-05-03: 10 mL/h via INTRAVENOUS
  Administered 2014-05-03: 10:00:00 via INTRAVENOUS

## 2014-05-03 MED ORDER — DEXAMETHASONE SODIUM PHOSPHATE 4 MG/ML IJ SOLN
INTRAMUSCULAR | Status: DC | PRN
  Administered 2014-05-03: 10 mg via INTRAVENOUS

## 2014-05-03 MED ORDER — FENTANYL CITRATE 0.05 MG/ML IJ SOLN
INTRAMUSCULAR | Status: AC
  Filled 2014-05-03: qty 6

## 2014-05-03 MED ORDER — OXYCODONE-ACETAMINOPHEN 5-325 MG PO TABS: 1.0000 | ORAL_TABLET | ORAL | Status: DC | PRN

## 2014-05-03 MED ORDER — PROPOFOL 10 MG/ML IV BOLUS
INTRAVENOUS | Status: DC | PRN
  Administered 2014-05-03: 50 mg via INTRAVENOUS
  Administered 2014-05-03: 200 mg via INTRAVENOUS

## 2014-05-03 MED ORDER — OXYCODONE HCL 5 MG/5ML PO SOLN: 5.0000 mg | Freq: Once | ORAL | Status: DC | PRN

## 2014-05-03 MED ORDER — BUPIVACAINE-EPINEPHRINE (PF) 0.25% -1:200000 IJ SOLN
INTRAMUSCULAR | Status: DC | PRN
  Administered 2014-05-03: 18 mL
  Administered 2014-05-03: 10 mL

## 2014-05-03 MED ORDER — 0.9 % SODIUM CHLORIDE (POUR BTL) OPTIME
TOPICAL | Status: DC | PRN
  Administered 2014-05-03: 200 mL

## 2014-05-03 MED ORDER — ONDANSETRON HCL 4 MG/2ML IJ SOLN
4.0000 mg | Freq: Once | INTRAMUSCULAR | Status: AC | PRN
Start: 2014-05-03 — End: 2014-05-03
  Administered 2014-05-03: 4 mg via INTRAVENOUS

## 2014-05-03 MED ORDER — CEFAZOLIN SODIUM-DEXTROSE 2-3 GM-% IV SOLR
INTRAVENOUS | Status: AC
  Filled 2014-05-03: qty 50

## 2014-05-03 MED ORDER — FENTANYL CITRATE 0.05 MG/ML IJ SOLN
INTRAMUSCULAR | Status: DC | PRN
  Administered 2014-05-03: 50 ug via INTRAVENOUS
  Administered 2014-05-03: 100 ug via INTRAVENOUS

## 2014-05-03 MED ORDER — EPHEDRINE SULFATE 50 MG/ML IJ SOLN
INTRAMUSCULAR | Status: DC | PRN
  Administered 2014-05-03 (×3): 10 mg via INTRAVENOUS

## 2014-05-03 MED ORDER — MIDAZOLAM HCL 5 MG/5ML IJ SOLN
INTRAMUSCULAR | Status: DC | PRN
  Administered 2014-05-03: 2 mg via INTRAVENOUS

## 2014-05-03 MED ORDER — MIDAZOLAM HCL 2 MG/2ML IJ SOLN: 1.0000 mg | INTRAMUSCULAR | Status: DC | PRN

## 2014-05-03 MED ORDER — FENTANYL CITRATE 0.05 MG/ML IJ SOLN: 50.0000 ug | INTRAMUSCULAR | Status: DC | PRN

## 2014-05-03 MED ORDER — LIDOCAINE HCL (CARDIAC) 20 MG/ML IV SOLN
INTRAVENOUS | Status: DC | PRN
  Administered 2014-05-03: 50 mg via INTRAVENOUS

## 2014-05-03 MED ORDER — HYDROMORPHONE HCL PF 1 MG/ML IJ SOLN
0.2500 mg | INTRAMUSCULAR | Status: DC | PRN
  Administered 2014-05-03: 0.5 mg via INTRAVENOUS

## 2014-05-03 MED ORDER — HYDROMORPHONE HCL PF 1 MG/ML IJ SOLN
INTRAMUSCULAR | Status: AC
  Filled 2014-05-03: qty 1

## 2014-05-03 MED ORDER — WARFARIN SODIUM 7.5 MG PO TABS: 7.5000 mg | ORAL_TABLET | Freq: Every day | ORAL | Status: DC

## 2014-05-03 SURGICAL SUPPLY — 57 items
ADH SKN CLS APL DERMABOND .7 (GAUZE/BANDAGES/DRESSINGS) ×1
APPLIER CLIP 9.375 MED OPEN (MISCELLANEOUS)
APR CLP MED 9.3 20 MLT OPN (MISCELLANEOUS)
BINDER BREAST LRG (GAUZE/BANDAGES/DRESSINGS) IMPLANT
BINDER BREAST MEDIUM (GAUZE/BANDAGES/DRESSINGS) IMPLANT
BINDER BREAST XLRG (GAUZE/BANDAGES/DRESSINGS) ×2 IMPLANT
BINDER BREAST XXLRG (GAUZE/BANDAGES/DRESSINGS) IMPLANT
BLADE 15 SAFETY STRL DISP (BLADE) ×3 IMPLANT
CANISTER SUC SOCK COL 7IN (MISCELLANEOUS) ×1 IMPLANT
CANISTER SUCT 1200ML W/VALVE (MISCELLANEOUS) ×3 IMPLANT
CHLORAPREP W/TINT 26ML (MISCELLANEOUS) ×3 IMPLANT
CLIP APPLIE 9.375 MED OPEN (MISCELLANEOUS) IMPLANT
CLIP TI WIDE RED SMALL 6 (CLIP) ×1 IMPLANT
COVER MAYO STAND STRL (DRAPES) ×3 IMPLANT
COVER PROBE W GEL 5X96 (DRAPES) ×3 IMPLANT
COVER TABLE BACK 60X90 (DRAPES) ×3 IMPLANT
DECANTER SPIKE VIAL GLASS SM (MISCELLANEOUS) IMPLANT
DERMABOND ADVANCED (GAUZE/BANDAGES/DRESSINGS) ×2
DERMABOND ADVANCED .7 DNX12 (GAUZE/BANDAGES/DRESSINGS) ×1 IMPLANT
DEVICE DUBIN W/COMP PLATE 8390 (MISCELLANEOUS) ×5 IMPLANT
DRAPE LAPAROSCOPIC ABDOMINAL (DRAPES) ×2 IMPLANT
DRAPE PED LAPAROTOMY (DRAPES) ×1 IMPLANT
DRAPE UTILITY XL STRL (DRAPES) ×3 IMPLANT
ELECT COATED BLADE 2.86 ST (ELECTRODE) ×3 IMPLANT
ELECT REM PT RETURN 9FT ADLT (ELECTROSURGICAL) ×3
ELECTRODE REM PT RTRN 9FT ADLT (ELECTROSURGICAL) ×1 IMPLANT
GLOVE BIOGEL PI IND STRL 7.0 (GLOVE) IMPLANT
GLOVE BIOGEL PI IND STRL 8 (GLOVE) ×1 IMPLANT
GLOVE BIOGEL PI INDICATOR 7.0 (GLOVE) ×2
GLOVE BIOGEL PI INDICATOR 8 (GLOVE) ×2
GLOVE ECLIPSE 7.0 STRL STRAW (GLOVE) ×2 IMPLANT
GLOVE ECLIPSE 8.0 STRL XLNG CF (GLOVE) ×3 IMPLANT
GLOVE EXAM NITRILE LRG STRL (GLOVE) ×2 IMPLANT
GOWN STRL REUS W/ TWL LRG LVL3 (GOWN DISPOSABLE) ×2 IMPLANT
GOWN STRL REUS W/TWL LRG LVL3 (GOWN DISPOSABLE) ×6
KIT MARKER MARGIN INK (KITS) ×3 IMPLANT
NDL HYPO 25X1 1.5 SAFETY (NEEDLE) IMPLANT
NEEDLE HYPO 25X1 1.5 SAFETY (NEEDLE) ×3 IMPLANT
NS IRRIG 1000ML POUR BTL (IV SOLUTION) ×3 IMPLANT
PACK BASIN DAY SURGERY FS (CUSTOM PROCEDURE TRAY) ×3 IMPLANT
PENCIL BUTTON HOLSTER BLD 10FT (ELECTRODE) ×3 IMPLANT
PIN SAFETY STERILE (MISCELLANEOUS) IMPLANT
SLEEVE SCD COMPRESS KNEE MED (MISCELLANEOUS) ×3 IMPLANT
SPONGE LAP 4X18 X RAY DECT (DISPOSABLE) ×5 IMPLANT
STAPLER VISISTAT 35W (STAPLE) IMPLANT
SUT MNCRL AB 4-0 PS2 18 (SUTURE) ×5 IMPLANT
SUT SILK 2 0 SH (SUTURE) IMPLANT
SUT VIC AB 3-0 SH 27 (SUTURE) ×3
SUT VIC AB 3-0 SH 27X BRD (SUTURE) ×1 IMPLANT
SUT VICRYL 3-0 CR8 SH (SUTURE) ×4 IMPLANT
SYR BULB 3OZ (MISCELLANEOUS) ×2 IMPLANT
SYR CONTROL 10ML LL (SYRINGE) IMPLANT
TOWEL OR 17X24 6PK STRL BLUE (TOWEL DISPOSABLE) ×3 IMPLANT
TOWEL OR NON WOVEN STRL DISP B (DISPOSABLE) ×1 IMPLANT
TUBE CONNECTING 20'X1/4 (TUBING)
TUBE CONNECTING 20X1/4 (TUBING) IMPLANT
YANKAUER SUCT BULB TIP NO VENT (SUCTIONS) ×2 IMPLANT

## 2014-05-03 NOTE — Discharge Instructions (Signed)
[facility] °Office Phone Number [phone] ° °BREAST BIOPSY/ PARTIAL MASTECTOMY: POST OP INSTRUCTIONS ° °Always review your discharge instruction sheet given to you by the facility where your surgery was performed. ° °IF YOU HAVE DISABILITY OR FAMILY LEAVE FORMS, YOU MUST BRING THEM TO THE OFFICE FOR PROCESSING.  DO NOT GIVE THEM TO YOUR DOCTOR. ° °1. A prescription for pain medication may be given to you upon discharge.  Take your pain medication as prescribed, if needed.  If narcotic pain medicine is not needed, then you may take acetaminophen (Tylenol) or ibuprofen (Advil) as needed. °2. Take your usually prescribed medications unless otherwise directed °3. If you need a refill on your pain medication, please contact your pharmacy.  They will contact our office to request authorization.  Prescriptions will not be filled after 5pm or on week-ends. °4. You should eat very light the first 24 hours after surgery, such as soup, crackers, pudding, etc.  Resume your normal diet the day after surgery. °5. Most patients will experience some swelling and bruising in the breast.  Ice packs and a good support bra will help.  Swelling and bruising can take several days to resolve.  °6. It is common to experience some constipation if taking pain medication after surgery.  Increasing fluid intake and taking a stool softener will usually help or prevent this problem from occurring.  A mild laxative (Milk of Magnesia or Miralax) should be taken according to package directions if there are no bowel movements after 48 hours. °7. Unless discharge instructions indicate otherwise, you may remove your bandages 24-48 hours after surgery, and you may shower at that time.  You may have steri-strips (small skin tapes) in place directly over the incision.  These strips should be left on the skin for 7-10 days.  If your surgeon used skin glue on the incision, you may shower in 24 hours.  The glue will flake off over the  next 2-3 weeks.  Any sutures or staples will be removed at the office during your follow-up visit. °8. ACTIVITIES:  You may resume regular daily activities (gradually increasing) beginning the next day.  Wearing a good support bra or sports bra minimizes pain and swelling.  You may have sexual intercourse when it is comfortable. °a. You may drive when you no longer are taking prescription pain medication, you can comfortably wear a seatbelt, and you can safely maneuver your car and apply brakes. °b. RETURN TO WORK:  ______________________________________________________________________________________ °9. You should see your doctor in the office for a follow-up appointment approximately two weeks after your surgery.  Your doctor’s nurse will typically make your follow-up appointment when she calls you with your pathology report.  Expect your pathology report 2-3 business days after your surgery.  You may call to check if you do not hear from us after three days. °10. OTHER INSTRUCTIONS: _______________________________________________________________________________________________ _____________________________________________________________________________________________________________________________________ °_____________________________________________________________________________________________________________________________________ °_____________________________________________________________________________________________________________________________________ ° °WHEN TO CALL YOUR DOCTOR: °1. Fever over 101.0 °2. Nausea and/or vomiting. °3. Extreme swelling or bruising. °4. Continued bleeding from incision. °5. Increased pain, redness, or drainage from the incision. ° °The clinic staff is available to answer your questions during regular business hours.  Please don’t hesitate to call and ask to speak to one of the nurses for clinical concerns.  If you have a medical emergency, go to the nearest  emergency room or call 911.  A surgeon from [facility] is always on call at the hospital. ° °For further questions, please visit [email or web address]  ° ° ° °  Post Anesthesia Home Care Instructions ° °Activity: °Get plenty of rest for the remainder of the day. A responsible adult should stay with you for 24 hours following the procedure.  °For the next 24 hours, DO NOT: °-Drive a car °-Operate machinery °-Drink alcoholic beverages °-Take any medication unless instructed by your physician °-Make any legal decisions or sign important papers. ° °Meals: °Start with liquid foods such as gelatin or soup. Progress to regular foods as tolerated. Avoid greasy, spicy, heavy foods. If nausea and/or vomiting occur, drink only clear liquids until the nausea and/or vomiting subsides. Call your physician if vomiting continues. ° °Special Instructions/Symptoms: °Your throat may feel dry or sore from the anesthesia or the breathing tube placed in your throat during surgery. If this causes discomfort, gargle with warm salt water. The discomfort should disappear within 24 hours. ° ° °Call your surgeon if you experience:  ° °1.  Fever over 101.0. °2.  Inability to urinate. °3.  Nausea and/or vomiting. °4.  Extreme swelling or bruising at the surgical site. °5.  Continued bleeding from the incision. °6.  Increased pain, redness or drainage from the incision. °7.  Problems related to your pain medication. ° ° °

## 2014-05-03 NOTE — H&P (View-Only) (Signed)
Patient ID: Alisha [name], female   DOB: 1961/09/27, 53 y.o.   MRN: [id number]  No chief complaint on file.   HPI Alisha [name] is a 53 y.o. female.  Patient presents with chief complaint of bilateral breast lumps. Patient has history of fibroadenoma and underwent needle localization with excision 2009. She is developed 2 lumps one in each breast and they are centrally located. Each is very tender. She underwent ultrasound-guided core biopsy of the findings consistent with fibroadenoma. Her causing discomfort the patient would like to have these removed. HPI  Past Medical History  Diagnosis Date  . DVT (deep venous thrombosis)     bilateral, 2 episodes: Requires lifelong therapy  . Chronic pain syndrome   . Restless leg syndrome   . Depression   . Hyperlipidemia   . Hypertension   . PVD (peripheral vascular disease)     s/p L fem-pop bypass 11/21/08; graft occluded 12/28/08;  aortogram w/ bilat LE runoff: 1.  bilat diffuse SFA occlusive dz, 2.  Mod to severe above-knee popliteal dz,  3.  Bilat 3-vessel runoff w/ mild tibial occlusive dz.  . Tobacco abuse   . Degenerative joint disease of knee   . Breast lump 02/2008    Biopsy 05/2008: showed no evidence of malignancy  . Irregular menses 9/08  . Pulmonary edema   . Anxiety   . Joint pain   . Mixed stress and urge urinary incontinence     Being followed by [facility], underwent cystoscopy & uroflowmetry   . Endometrial mass 10/12/2012    Endometrium, biopsy on 11/04/12 - PROLIFERATIVE ENDOMETRIUM AND ABUNDANT MUCUS. NO HYPERPLASIA OR CARCINOMA.   . Sigmoid diverticulitis 10/26/2012  . Asthma   . Breast mass in female     bi lat     Past Surgical History  Procedure Laterality Date  . Femoral bypass  11/09  .  right external iliac artery stent    . Right breast needle-localized lumpectomy    . Pr vein bypass graft,aorto-fem-pop    . Carotid endarterectomy    . Tubal ligation    . Tonsillectomy    . Cardiac catheterization       Family History  Problem Relation Age of Onset  . Breast cancer Mother   . Colon cancer Neg Hx   . Rectal cancer Neg Hx   . Stomach cancer Neg Hx   . Breast cancer Other     GREAT AUNT    Social History History  Substance Use Topics  . Smoking status: Current Every Day Smoker -- 0.25 packs/day for 18 years    Types: Cigarettes  . Smokeless tobacco: Never Used     Comment: 3-6 per day  . Alcohol Use: Yes     Comment: rare    Allergies  Allergen Reactions  . Propoxyphene N-Acetaminophen Swelling    Current Outpatient Prescriptions  Medication Sig Dispense Refill  . albuterol (PROAIR HFA) 108 (90 BASE) MCG/ACT inhaler Inhale 2 puffs into the lungs every 6 (six) hours as needed for wheezing. Take 1-2 puffs as needed every 4-6 hours as needed for shortness of breath  1 Inhaler  3  . amLODipine (NORVASC) 10 MG tablet Take 1 tablet (10 mg total) by mouth daily.  30 tablet  11  . aspirin 81 MG tablet Take 1 tablet (81 mg total) by mouth daily.  100 tablet  3  . atorvastatin (LIPITOR) 40 MG tablet Take 1 tablet (40 mg total) by mouth at bedtime.  90 tablet  3  . buPROPion (WELLBUTRIN XL) 150 MG 24 hr tablet Take 150 mg by mouth daily.      . carvedilol (COREG) 25 MG tablet Take 1 tablet (25 mg total) by mouth 2 (two) times daily with a meal.  180 tablet  3  . diazepam (VALIUM) 5 MG tablet Take 10 mg by mouth 3 (three) times daily.       . diclofenac (CATAFLAM) 50 MG tablet Take 1 tablet (50 mg total) by mouth 3 (three) times daily.  90 tablet  3  . DULoxetine (CYMBALTA) 60 MG capsule Take 60 mg by mouth. Take 1 capsule in the morning and 1 capsule at bedtime       . gabapentin (NEURONTIN) 800 MG tablet TAKE 1 TABLET BY MOUTH IN THE MORNING, 1 TABLET BY MOUTH MIDDAY, AND 2 TABLETS AT BEDTIME  120 tablet  3  . hydrOXYzine (ATARAX/VISTARIL) 25 MG tablet Take 25 mg by mouth 3 (three) times daily as needed for itching.       Marland Kitchen lisinopril-hydrochlorothiazide (PRINZIDE,ZESTORETIC)  20-12.5 MG per tablet Take 2 tablets by mouth daily.  60 tablet  5  . naproxen (NAPROSYN) 500 MG tablet Take 1 tablet (500 mg total) by mouth 2 (two) times daily with a meal.  60 tablet  0  . potassium chloride (KLOR-CON 10) 10 MEQ tablet Take 1 tablet (10 mEq total) by mouth daily.  30 tablet  1  . traZODone (DESYREL) 100 MG tablet Take 300 mg by mouth at bedtime.       . ziprasidone (GEODON) 60 MG capsule Take 60 mg by mouth 2 (two) times daily with a meal.        No current facility-administered medications for this visit.    Review of Systems Review of Systems  Constitutional: Negative.   HENT: Negative.   Eyes: Negative.   Respiratory: Negative.   Cardiovascular: Negative for leg swelling.  Endocrine: Negative.   Musculoskeletal: Positive for arthralgias, back pain and myalgias.  Allergic/Immunologic: Negative.   Neurological: Negative.   Hematological: Negative.   Psychiatric/Behavioral: Negative.     Blood pressure 128/84, pulse 84, resp. rate 16, height 5\' 4"  (1.626 m), weight 196 lb 3.2 oz (88.996 kg), last menstrual period 03/06/2014.  Physical Exam Physical Exam  Constitutional: She appears well-developed and well-nourished.  HENT:  Head: Normocephalic and atraumatic.  Eyes: Pupils are equal, round, and reactive to light. No scleral icterus.  Neck: Normal range of motion. Neck supple.  Cardiovascular: Normal rate and regular rhythm.   Pulmonary/Chest:    Musculoskeletal: Normal range of motion.  Neurological: She is alert.  Skin: Skin is warm and dry.  Psychiatric: She has a normal mood and affect. Her behavior is normal. Thought content normal.    Data Reviewed HPI: The patient returns for a followup after a ultrasound-guided  biopsies of bilateral breast masses 12/15/2013. The patient is doing  well and denies any biopsy site complications other than bruising  and tenderness.  Pathology results: Right breast pathology reveals densely hyalinized  stroma  which may be related to a hyalinized fibroadenoma and left  breast pathology reveals a fibroadenoma. This is concordant with the  imaging findings.  Biopsy site: Clean, dry, and intact. Steri-Strips have been removed  and bandages with 4x4s overlie the biopsy site.  PLAN: All the patient's questions were answered. She is aware of the  results and demonstrates understanding. The patient is scheduled to  follow-up with Dr. Brantley Stage to discuss  the possibility of surgical  excision on 01/10/2014 at 10:30 a.m.   Assessment    Bilateral breast fibroadenoma painful Patient Active Problem List   Diagnosis Date Noted  . Abnormal ECG 01/28/2014  . Encounter for screening colonoscopy 01/12/2014  . Health care maintenance 09/16/2013  . PVD (peripheral vascular disease) 09/02/2013  . Atherosclerosis of native arteries of the extremities with intermittent claudication 09/02/2013  . Pain in limb 09/02/2013  . Sigmoid diverticulitis 10/26/2012  . Metrorrhagia 10/12/2012  . Endometrial mass 10/12/2012  . 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 Prolapse of female pelvic organs 05/08/2012  . Osteoarthritis of both knees 02/15/2012  . Long term current use of anticoagulant 01/20/2011  . Chronic pain syndrome 07/05/2010  . RESTLESS LEG SYNDROME 02/27/2010  . DVT 02/22/2010  . VISUAL ACUITY, DECREASED 01/12/2010  . THYROID STIMULATING HORMONE, ABNORMAL 09/25/2009  . DEPRESSION 07/17/2009  . UNSPECIFIED DISORDER TEETH&SUPPORTING STRUCTURES 01/31/2009  . 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 TOBACCO ABUSE 10/20/2006  . HYPERTENSION 10/20/2006  . PERIPHERAL VASCULAR DISEASE 10/20/2006  . HYPERLIPIDEMIA, MIXED 10/10/2006      Plan    Patient like to have those removed due to discomfort. We'll need to hold Coumadin 5 days before procedure.The procedure has been discussed with the patient. Seed localized bilateral lumpectomy discussed.  The procedure has been discussed with the  patient. Alternatives to surgery have been discussed with the patient.  Risks of surgery include bleeding,  Infection,  Seroma formation, death,  and the need for further surgery.   The patient understands and wishes to proceed.Alternatives to surgery have been discussed with the patient.  Risks of surgery include bleeding,  Infection,  Seroma formation, death,  and the need for further surgery.   The patient understands and wishes to proceed.       Akera Snowberger A. Tyrick Dunagan 04/11/2014, 2:53 PM

## 2014-05-03 NOTE — Anesthesia Preprocedure Evaluation (Signed)
Anesthesia Evaluation  Patient identified by MRN, date of birth, ID band Patient awake    Reviewed: Allergy & Precautions, H&P , NPO status , Patient's Chart, lab work & pertinent test results  Airway Mallampati: I TM Distance: >3 FB Neck ROM: Full    Dental  (+) Teeth Intact, Dental Advisory Given   Pulmonary asthma , Current Smoker,  breath sounds clear to auscultation        Cardiovascular hypertension, Pt. on medications and Pt. on home beta blockers + Peripheral Vascular Disease Rhythm:Regular Rate:Normal     Neuro/Psych    GI/Hepatic   Endo/Other    Renal/GU      Musculoskeletal   Abdominal   Peds  Hematology   Anesthesia Other Findings   Reproductive/Obstetrics                           Anesthesia Physical Anesthesia Plan  ASA: III  Anesthesia Plan: General   Post-op Pain Management:    Induction: Intravenous  Airway Management Planned: LMA  Additional Equipment:   Intra-op Plan:   Post-operative Plan: Extubation in OR  Informed Consent: I have reviewed the patients History and Physical, chart, labs and discussed the procedure including the risks, benefits and alternatives for the proposed anesthesia with the patient or authorized representative who has indicated his/her understanding and acceptance.   Dental advisory given  Plan Discussed with: CRNA, Anesthesiologist and Surgeon  Anesthesia Plan Comments:         Anesthesia Quick Evaluation

## 2014-05-03 NOTE — Interval H&P Note (Signed)
History and Physical Interval Note:  05/03/2014 10:50 AM  Leeroy Bock Alisha [name]  has presented today for surgery, with the diagnosis of bilateral breast mass   The various methods of treatment have been discussed with the patient and family. After consideration of risks, benefits and other options for treatment, the patient has consented to  Procedure(s): BREAST LUMPECTOMY WITH RADIOACTIVE SEED LOCALIZATION (Bilateral) as a surgical intervention .  The patient's history has been reviewed, patient examined, no change in status, stable for surgery.  I have reviewed the patient's chart and labs.  Questions were answered to the patient's satisfaction.     Dusten Ellinwood A. Caryssa Elzey

## 2014-05-03 NOTE — Transfer of Care (Signed)
Immediate Anesthesia Transfer of Care Note  Patient: Alisha [name]  Procedure(s) Performed: Procedure(s): BREAST LUMPECTOMY WITH RADIOACTIVE SEED LOCALIZATION (Bilateral)  Patient Location: PACU  Anesthesia Type:General  Level of Consciousness: awake, alert  and oriented  Airway & Oxygen Therapy: Patient Spontanous Breathing and Patient connected to face mask oxygen  Post-op Assessment: Report given to PACU RN and Post -op Vital signs reviewed and stable  Post vital signs: Reviewed and stable  Complications: No apparent anesthesia complications

## 2014-05-03 NOTE — Progress Notes (Signed)
Per phone call from lab not enough blood collected to complete PP/PTT. Dr. Brantley Stage aware pt off coumadin x 8 days ok to not repeat today continue with surgery. Dr Al Corpus aware.

## 2014-05-03 NOTE — Anesthesia Postprocedure Evaluation (Signed)
  Anesthesia Post-op Note  Patient: Alisha [name]  Procedure(s) Performed: Procedure(s): BREAST LUMPECTOMY WITH RADIOACTIVE SEED LOCALIZATION (Bilateral)  Patient Location: PACU  Anesthesia Type:General  Level of Consciousness: awake, alert  and oriented  Airway and Oxygen Therapy: Patient Spontanous Breathing  Post-op Pain: none  Post-op Assessment: Post-op Vital signs reviewed  Post-op Vital Signs: Reviewed  Last Vitals:  Filed Vitals:   05/03/14 1418  BP:   Pulse: 75  Temp:   Resp: 18    Complications: No apparent anesthesia complications

## 2014-05-03 NOTE — Brief Op Note (Signed)
05/03/2014  1:02 PM  PATIENT:  Alisha [name]  53 y.o. female  PRE-OPERATIVE DIAGNOSIS:  Bilateral breast mass  POST-OPERATIVE DIAGNOSIS:  Bilateral breast mass  PROCEDURE:  Procedure(s): BREAST LUMPECTOMY WITH RADIOACTIVE SEED LOCALIZATION (Bilateral)  SURGEON:  Surgeon(s) and Role:    * Kassandra Meriweather A. Kasha Howeth, MD - Primary      ANESTHESIA:   local and general  EBL:  Total I/O In: 500 [I.V.:500] Out: -       LOCAL MEDICATIONS USED:  MARCAINE     SPECIMEN:  Source of Specimen:  bilateral breast tissue   DISPOSITION OF SPECIMEN:  PATHOLOGY  COUNTS:  YES  TOURNIQUET:  * No tourniquets in log *  DICTATION: .Other Dictation: Dictation Number   [id number]  PLAN OF CARE: Discharge to home after PACU  PATIENT DISPOSITION:  PACU - hemodynamically stable.   Delay start of Pharmacological VTE agent (>24hrs) due to surgical blood loss or risk of bleeding: not applicable

## 2014-05-03 NOTE — Anesthesia Procedure Notes (Signed)
Procedure Name: LMA Insertion Date/Time: 05/03/2014 11:37 AM Performed by: Melynda Ripple D Pre-anesthesia Checklist: Patient identified, Emergency Drugs available, Suction available and Patient being monitored Patient Re-evaluated:Patient Re-evaluated prior to inductionOxygen Delivery Method: Circle System Utilized Preoxygenation: Pre-oxygenation with 100% oxygen Intubation Type: IV induction Ventilation: Mask ventilation without difficulty LMA: LMA inserted LMA Size: 4.0 Number of attempts: 1 Airway Equipment and Method: bite block Placement Confirmation: positive ETCO2 Tube secured with: Tape Dental Injury: Teeth and Oropharynx as per pre-operative assessment

## 2014-05-04 ENCOUNTER — Encounter (HOSPITAL_BASED_OUTPATIENT_CLINIC_OR_DEPARTMENT_OTHER): Payer: Self-pay | Admitting: Surgery

## 2014-05-04 NOTE — Op Note (Signed)
Alisha [name], Alisha [name]                 ACCOUNT NO.:  [id number]  MEDICAL RECORD NO.:  [id number]  LOCATION:                                 FACILITY:  PHYSICIAN:  Memphis Decoteau A. Laelani Vasko, M.D.DATE OF BIRTH:  1961/12/27  DATE OF PROCEDURE:  05/03/2014 DATE OF DISCHARGE:  05/03/2014                              OPERATIVE REPORT   PREOPERATIVE DIAGNOSIS:  Bilateral breast masses.  POSTOPERATIVE DIAGNOSIS:  Bilateral breast masses.  PROCEDURE:  Seed localized excision of bilateral breast masses.  SURGEON:  Marcello Moores A. Zennie Ayars, MD  ANESTHESIA:  LMA with 0.25% Sensorcaine local with epinephrine.  EBL:  Minimal.  SPECIMEN: 1. Left breast mass with clip and seed to pathology, verified by     radiography. 2. Right breast mass with seed only since no clips deployed initially.  DRAINS:  None.  EBL:  Minimal.  IV FLUIDS:  500 mL crystalloid.  INDICATIONS FOR PROCEDURE:  The patient presents for excision of bilateral fibroadenoma.  These were causing discomfort, she wished to have them repaired.  Risks, benefits, and alternative therapies were discussed with the patient.  She understood and wished to proceed.  Risk of bleeding, infection, recurrence, the need for further operative procedure as well as complications involving underlying medical problems, DVT, cardiovascular event, death, and other operations  necessary.  She understood the above and agreed to proceed.  DESCRIPTION OF PROCEDURE:  The patient underwent seed localization by the radiologist last week.  On the left side, there were some discordance with seed placement.  I spoke with Dr. Conchita Paris, and she explained that the seed and the previously placed clip and mass were 3 cm apart.  The seed retracted more superficial from the mass Noted on the films.  On the right side, the seed was Adjacent  to the right side of the  calcified mass of note.  Both were felt to be fibroadenomas preoperatively.  After discussing this, I  agreed to proceed with seed localization without placing a wire.  The patient was met in the holding area at this point.  Questions were answered.  She had held her Coumadin for 1 week.  She was taken back to the operating room.  She was placed supine on the OR table.  After induction of LMA anesthesia, both breasts were prepped and draped in sterile fashion. Both seeds were localized in the holding area with the Neoprobe and again after prep and drape.  Time-out was done.  Left side was done first.  A curvilinear incision was made in the left lateral breast over the signal for the first seed on the left side.  The seed was extremely superficial and seemed to have migrated at least 3 cm from the mass which was 3 cm deep to it.  We excised all the tissue around the seed as well as the mass in the clip.  The seed was actually partially visible and extremely superficial upon removing the specimen.  I needed to be very careful not to let this to separate the seed from the tissue.  This was all placed into the receptacle for the radiograph and compressed. Upon compression, the seed was easily visualized  below the compression plate.  We were able to take the radiograph showing that the seed and clip were indeed within the specimen.  A green pin was placed with the help of the radiologist at the seed site and a second pin was placed where the clip was.  This was then sent to Pathology.  We called Pathology as well to inform them that the seed was quite superficial and to take care when they move the grid, not to dislodge.  The wound was examined, found to be hemostatic after cautery application.  It was irrigated and closed the deep layer with 3-0 Vicryl and subsequent 4-0 Monocryl subcuticular stitch.  Dermabond was applied.  There was no radioactivity in the left breast after removal of seeds with the use of the Neoprobe.  Right breast was then addressed.  Neoprobe was used to identify the  hot spot just below the nipple.  Curvilinear incision was made along the border of the superior aspect of the nipple and dissection was carried down through subcutaneous fatty tissue.  We withheld the Neoprobe.  We were able to dissect circumferentially around the seed.  We then removed the entire mass and seed in its entirety.  There were no background Counts  in the right breast after removal of the seed.  Specimen radiograph  contained the seed .  Radiograph was obtained which showed the mass and seed to be intact within the specimen.  This was again processed as the first site and then sent to Pathology.  Radiograph, this was taken as well and appropriate pins were placed with the help of the radiologist to the right breast specimen.  Wound was irrigated and found to be hemostatic.  It was closed in layers, deep layer with 3-0 Vicryl and subsequent 4-0 Monocryl stitch.  Dermabond applied.  Patient placed in breast binder.  All final counts sponge, needle, and instruments found to be correct at this portion of the case.  The patient was then extubated, taken to recovery in satisfactory condition.     Nitara Szczerba A. Estie Sproule, M.D.   ______________________________ Joyice Faster. Blas Riches, M.D.    TAC/MEDQ  D:  05/03/2014  T:  05/04/2014  Job:  [id number]

## 2014-05-06 ENCOUNTER — Telehealth (INDEPENDENT_AMBULATORY_CARE_PROVIDER_SITE_OTHER): Payer: Self-pay

## 2014-05-06 NOTE — Telephone Encounter (Signed)
LMOM> Pathology from both lumpectomies were benign.

## 2014-05-06 NOTE — Telephone Encounter (Signed)
Message copied by Carlene Coria on Fri May 06, 2014  2:17 PM ------      Message from: Erroll Luna A      Created: Fri May 06, 2014  9:36 AM       b9 ------

## 2014-05-09 ENCOUNTER — Ambulatory Visit (INDEPENDENT_AMBULATORY_CARE_PROVIDER_SITE_OTHER): Payer: Medicare Other | Admitting: Pharmacist

## 2014-05-09 DIAGNOSIS — I82409 Acute embolism and thrombosis of unspecified deep veins of unspecified lower extremity: Secondary | ICD-10-CM | POA: Diagnosis not present

## 2014-05-09 DIAGNOSIS — Z7901 Long term (current) use of anticoagulants: Secondary | ICD-10-CM

## 2014-05-09 LAB — POCT INR: INR: 2

## 2014-05-09 NOTE — Patient Instructions (Signed)
Patient instructed to take medications as defined in the Anti-coagulation Track section of this encounter.  Patient instructed to take today's dose.  Patient verbalized understanding of these instructions.    

## 2014-05-09 NOTE — Progress Notes (Signed)
Anti-Coagulation Progress Note  Alisha [name] is a 53 y.o. female who is currently on an anti-coagulation regimen.    RECENT RESULTS: Recent results are below, the most recent result is correlated with a dose of 67.5 mg. per week: Lab Results  Component Value Date   INR 2.00 05/09/2014   INR 3.5 04/25/2014   INR 2.20 04/04/2014    ANTI-COAG DOSE: Anticoagulation Dose Instructions as of 05/09/2014     Dorene Grebe Tue Wed Thu Fri Sat   New Dose 11.25 mg 7.5 mg 11.25 mg 7.5 mg 11.25 mg 7.5 mg 15 mg       ANTICOAG SUMMARY: Anticoagulation Episode Summary   Current INR goal 2.0-3.0  Next INR check 05/16/2014  INR from last check 2.00 (05/09/2014)  Weekly max dose   Target end date   [facility]  Preferred lab   Send INR reminders to ANTICOAG IMP   Indications  DVT [453.40] Long term current use of anticoagulant [V58.61]        Comments       Anticoagulation Care Providers   Provider Role Specialty Phone number   Burman Freestone, MD  Internal Medicine [phone]      ANTICOAG TODAY: Anticoagulation Summary as of 05/09/2014   INR goal 2.0-3.0  Selected INR 2.00 (05/09/2014)  Next INR check 05/16/2014  Target end date    Indications  DVT [453.40] Long term current use of anticoagulant [V58.61]      Anticoagulation Episode Summary   [facility] check [facility]   Preferred lab    Send INR reminders to ANTICOAG IMP   Comments     Anticoagulation Care Providers   Provider Role Specialty Phone number   Burman Freestone, MD  Internal Medicine [phone]      PATIENT INSTRUCTIONS: Patient Instructions  Patient instructed to take medications as defined in the Anti-coagulation Track section of this encounter.  Patient instructed to take today's dose.  Patient verbalized understanding of these instructions.       FOLLOW-UP Return in 7 days (on 05/16/2014) for Follow up INR at 1115h.  Jorene Guest, III Pharm.D., CACP

## 2014-05-16 ENCOUNTER — Ambulatory Visit (INDEPENDENT_AMBULATORY_CARE_PROVIDER_SITE_OTHER): Payer: Medicare Other | Admitting: Pharmacist

## 2014-05-16 DIAGNOSIS — Z7901 Long term (current) use of anticoagulants: Secondary | ICD-10-CM

## 2014-05-16 DIAGNOSIS — I82409 Acute embolism and thrombosis of unspecified deep veins of unspecified lower extremity: Secondary | ICD-10-CM | POA: Diagnosis not present

## 2014-05-16 LAB — POCT INR: INR: 2.7

## 2014-05-16 NOTE — Patient Instructions (Signed)
Patient instructed to take medications as defined in the Anti-coagulation Track section of this encounter.  Patient instructed to take today's dose.  Patient verbalized understanding of these instructions.    

## 2014-05-16 NOTE — Progress Notes (Signed)
Anti-Coagulation Progress Note  Alisha [name] is a 53 y.o. female who is currently on an anti-coagulation regimen.    RECENT RESULTS: Recent results are below, the most recent result is correlated with a dose of 67.5 mg. per week: Lab Results  Component Value Date   INR 2.7 05/16/2014   INR 2.00 05/09/2014   INR 3.5 04/25/2014    ANTI-COAG DOSE: Anticoagulation Dose Instructions as of 05/16/2014     Dorene Grebe Tue Wed Thu Fri Sat   New Dose 11.25 mg 7.5 mg 11.25 mg 7.5 mg 11.25 mg 7.5 mg 15 mg       ANTICOAG SUMMARY: Anticoagulation Episode Summary   Current INR goal 2.0-3.0  Next INR check 06/13/2014  INR from last check 2.7 (05/16/2014)  Weekly max dose   Target end date   [facility]  Preferred lab   Send INR reminders to ANTICOAG IMP   Indications  DVT [453.40] Long term current use of anticoagulant [V58.61]        Comments       Anticoagulation Care Providers   Provider Role Specialty Phone number   Burman Freestone, MD  Internal Medicine [phone]      ANTICOAG TODAY: Anticoagulation Summary as of 05/16/2014   INR goal 2.0-3.0  Selected INR 2.7 (05/16/2014)  Next INR check 06/13/2014  Target end date    Indications  DVT [453.40] Long term current use of anticoagulant [V58.61]      Anticoagulation Episode Summary   [facility] check [facility]   Preferred lab    Send INR reminders to ANTICOAG IMP   Comments     Anticoagulation Care Providers   Provider Role Specialty Phone number   Burman Freestone, MD  Internal Medicine [phone]      PATIENT INSTRUCTIONS: Patient Instructions  Patient instructed to take medications as defined in the Anti-coagulation Track section of this encounter.  Patient instructed to take today's dose.  Patient verbalized understanding of these instructions.     FOLLOW-UP Return in about 4 weeks (around 06/13/2014) for Follow up INR at 1100.  Jorene Guest, III Pharm.D., CACP

## 2014-05-16 NOTE — Progress Notes (Signed)
INTERNAL MEDICINE TEACHING ATTENDING ADDENDUM - Aldine Contes M.D  Duration- long term use, Indication- DVT, INR- therapeutic. Agree with Dr. Gladstone Pih recommendations as outlined in his note.

## 2014-05-26 ENCOUNTER — Ambulatory Visit (INDEPENDENT_AMBULATORY_CARE_PROVIDER_SITE_OTHER): Payer: Medicare Other | Admitting: Internal Medicine

## 2014-05-26 ENCOUNTER — Encounter: Payer: Self-pay | Admitting: Internal Medicine

## 2014-05-26 VITALS — BP 89/65 | HR 76 | Temp 97.4°F | Wt 204.2 lb

## 2014-05-26 DIAGNOSIS — IMO0002 Reserved for concepts with insufficient information to code with codable children: Secondary | ICD-10-CM | POA: Diagnosis not present

## 2014-05-26 DIAGNOSIS — M170 Bilateral primary osteoarthritis of knee: Secondary | ICD-10-CM

## 2014-05-26 DIAGNOSIS — I739 Peripheral vascular disease, unspecified: Secondary | ICD-10-CM | POA: Diagnosis not present

## 2014-05-26 DIAGNOSIS — I82409 Acute embolism and thrombosis of unspecified deep veins of unspecified lower extremity: Secondary | ICD-10-CM | POA: Diagnosis not present

## 2014-05-26 DIAGNOSIS — F17200 Nicotine dependence, unspecified, uncomplicated: Secondary | ICD-10-CM | POA: Diagnosis not present

## 2014-05-26 DIAGNOSIS — M1710 Unilateral primary osteoarthritis, unspecified knee: Secondary | ICD-10-CM | POA: Diagnosis not present

## 2014-05-26 DIAGNOSIS — I10 Essential (primary) hypertension: Secondary | ICD-10-CM | POA: Diagnosis not present

## 2014-05-26 DIAGNOSIS — Z7901 Long term (current) use of anticoagulants: Secondary | ICD-10-CM | POA: Diagnosis not present

## 2014-05-26 NOTE — Assessment & Plan Note (Addendum)
Patient reports progressive pain with increased knee buckling.  No significant palpable effusion.  Will start with b/l knee x-rays but she reports failure of steroid knee injection, and has already undergone PT, so will likely require ortho referral.  With regards to pain mgmt, she had been taking naproxen.  She has been referred to pain clinic, but missed first appt and something happened with rescheduling - we have left msg at [facility].

## 2014-05-26 NOTE — Patient Instructions (Signed)
-   Regarding your blood pressure, please STOP taking Amlodipine 10 mg daily.  Return in 1 month for blood pressure follow up.  Hold your blood pressure medications tomorrow and restart on 05/28/14. Your other blood pressure medication is Lisinopril-Hydrochlorothiazide 20-12.5, 2 tablets daily.    -Regarding your pain, be sure to make an appointment at the pain clinic that we referred you to  -Regarding your knee pain specifically, we will start with x-rays and then consider orthopedic surgery referral  Thank you for bringing your medicines today. This helps [name] keep you safe from mistakes.  Please be sure to bring all of your medications with you to every visit.  Should you have any new or worsening symptoms, please be sure to call the clinic at [phone].

## 2014-05-26 NOTE — Assessment & Plan Note (Addendum)
BP Readings from Last 3 Encounters:  05/26/14 89/65  05/03/14 124/65  05/03/14 124/65    Lab Results  Component Value Date   NA 144 05/03/2014   K 4.3 05/03/2014   CREATININE 1.03 05/03/2014    Assessment: Blood pressure control:  hypotensive today, orthostatics negative Progress toward BP goal:   overly corrected ,likely due to improved compliance  Plan: Medications:   discontinue amlodipine 10, hold all BP meds tomorrow, restart meds on 5/30 which include coreg 25 bid and lisinopril-hctz 40-25 (most recent cath this year showed normal EF but in 2007, she had EF of 45%, so continue coreg and ACEI) -- may consider changing HCTZ back to low dose amlodipine at next visit given PVD Other plans: return in 1 month for BP follow up  --> plan to check A1c at next visit, was 6.5 in 2012

## 2014-05-26 NOTE — Assessment & Plan Note (Signed)
  Assessment: Progress toward smoking cessation:   decreased # of cigarettes/day Barriers to progress toward smoking cessation:   pain, withdrawal  Plan: Instruction/counseling given:  I counseled patient on the dangers of tobacco use, advised patient to stop smoking, and reviewed strategies to maximize success. Educational resources provided:    Self management tools provided:    Medications to assist with smoking cessation:  None Patient agreed to the following self-care plans for smoking cessation: call [facility] (1-800-QUIT-NOW);go to the [employer] (https://scott-booker.info/)

## 2014-05-26 NOTE — Progress Notes (Signed)
Subjective:   Patient ID: Alisha [name] female   DOB: Jul 02, 1961 53 y.o.   MRN: [id number]  Chief Complaint  Patient presents with  . routine follow up  . Numbness    c/o numbness in both legs   HPI: Alisha [name] is a 53 y.o. woman with h/o PVD, HLD, HTN, h/o DVT on anticoag, & OA of b/l knees who presents for routine f/u.    I last saw patient on 01/11/14 for HTN - med compliance has been in question for her.    Reports she has left leg buckles and numbness, taking smaller steps.  Having trouble walking.   With regards to pain, still has not made appt at pain clinic.   She reports worsening knee pain - she has gotten b/l knee injections but reports steroid injections only worked for a few days.    Review of Systems: Constitutional: Denies fever, chills, diaphoresis. Low energy with okay appetite. HEENT: Denies photophobia, eye pain, redness, hearing loss, ear pain, congestion, sore throat, rhinorrhea, sneezing, mouth sores, trouble swallowing, neck pain, neck stiffness and tinnitus. Difficulty with reading. Respiratory: Denies SOB, chest tightness. +DOE with occ wheezing & cough, better with inhaler Cardiovascular: Denies chest pain, palpitations; +leg swelling Gastrointestinal: Denies nausea, vomiting, abdominal pain, diarrhea, constipation,blood in stool and abdominal distention.  Genitourinary: Denies dysuria, urgency, frequency, hematuria, flank pain and difficulty urinating.  Musculoskeletal: joint pain as above Skin: Denies pallor, rash Neurological: Denies seizures, syncope, lightheadedness; slight HA yesterday, slightly dizzy/lightheaded today Hematological: no obvious s/s of bleeding   Past Medical History  Diagnosis Date  . DVT (deep venous thrombosis)     bilateral, 2 episodes: Requires lifelong therapy  . Chronic pain syndrome   . Restless leg syndrome   . Depression   . Hyperlipidemia   . Hypertension   . PVD (peripheral vascular disease)     s/p L fem-pop  bypass 11/21/08; graft occluded 12/28/08;  aortogram w/ bilat LE runoff: 1.  bilat diffuse SFA occlusive dz, 2.  Mod to severe above-knee popliteal dz,  3.  Bilat 3-vessel runoff w/ mild tibial occlusive dz.  . Tobacco abuse   . Degenerative joint disease of knee   . Breast lump 02/2008    Biopsy 05/2008: showed no evidence of malignancy  . Irregular menses 9/08  . Pulmonary edema   . Anxiety   . Joint pain   . Mixed stress and urge urinary incontinence     Being followed by [facility], underwent cystoscopy & uroflowmetry   . Endometrial mass 10/12/2012    Endometrium, biopsy on 11/04/12 - PROLIFERATIVE ENDOMETRIUM AND ABUNDANT MUCUS. NO HYPERPLASIA OR CARCINOMA.   . Sigmoid diverticulitis 10/26/2012  . Asthma   . Breast mass in female     bi lat    Current Outpatient Prescriptions  Medication Sig Dispense Refill  . amLODipine (NORVASC) 10 MG tablet Take 1 tablet (10 mg total) by mouth daily.  30 tablet  11  . aspirin 81 MG tablet Take 1 tablet (81 mg total) by mouth daily.  100 tablet  3  . atorvastatin (LIPITOR) 40 MG tablet Take 1 tablet (40 mg total) by mouth at bedtime.  90 tablet  3  . buPROPion (WELLBUTRIN XL) 150 MG 24 hr tablet Take 150 mg by mouth daily.      . carvedilol (COREG) 25 MG tablet Take 1 tablet (25 mg total) by mouth 2 (two) times daily with a meal.  180 tablet  3  .  diazepam (VALIUM) 5 MG tablet Take 10 mg by mouth 3 (three) times daily.       . diclofenac (CATAFLAM) 50 MG tablet Take 1 tablet (50 mg total) by mouth 3 (three) times daily.  90 tablet  3  . DULoxetine (CYMBALTA) 60 MG capsule Take 60 mg by mouth. Take 1 capsule in the morning and 1 capsule at bedtime       . gabapentin (NEURONTIN) 800 MG tablet TAKE 1 TABLET BY MOUTH IN THE MORNING, 1 TABLET BY MOUTH MIDDAY, AND 2 TABLETS AT BEDTIME  120 tablet  3  . hydrOXYzine (ATARAX/VISTARIL) 25 MG tablet Take 25 mg by mouth 3 (three) times daily as needed for itching.       Marland Kitchen lisinopril-hydrochlorothiazide  (PRINZIDE,ZESTORETIC) 20-12.5 MG per tablet TAKE 2 TABLET BY MOUTH EVERY DAY  180 tablet  1  . naproxen (NAPROSYN) 500 MG tablet Take 1 tablet (500 mg total) by mouth 2 (two) times daily with a meal.  60 tablet  0  . oxyCODONE-acetaminophen (ROXICET) 5-325 MG per tablet Take 1 tablet by mouth every 4 (four) hours as needed.  30 tablet  0  . potassium chloride (KLOR-CON 10) 10 MEQ tablet Take 1 tablet (10 mEq total) by mouth daily.  30 tablet  1  . PROAIR HFA 108 (90 BASE) MCG/ACT inhaler INHALE 1 TO 2 PUFFS EVERY 4 TO 6 HOURS AS NEEDED FOR SHORTNESS OF BREATH/WHEEZING  34 g  2  . traZODone (DESYREL) 100 MG tablet Take 300 mg by mouth at bedtime.       Marland Kitchen warfarin (COUMADIN) 7.5 MG tablet Take 1 tablet (7.5 mg total) by mouth daily at 6 PM.  90 tablet  2  . ziprasidone (GEODON) 60 MG capsule Take 60 mg by mouth 2 (two) times daily with a meal.        No current facility-administered medications for this visit.   Family History  Problem Relation Age of Onset  . Breast cancer Mother   . Colon cancer Neg Hx   . Rectal cancer Neg Hx   . Stomach cancer Neg Hx   . Breast cancer Other     GREAT AUNT   History   Social History  . Marital Status: Divorced    Spouse Name: N/A    Number of Children: 48  . Years of Education: N/A   Occupational History  . disable    Social History Main Topics  . Smoking status: Current Every Day Smoker -- 0.25 packs/day for 18 years    Types: Cigarettes  . Smokeless tobacco: Never Used     Comment: 3-6 per day  . Alcohol Use: Yes     Comment: rare  . Drug Use: No  . Sexual Activity: No   Other Topics Concern  . Not on file   Social History Narrative   Financial assistance application initiated. Patient needs to submit further paperwork to complete   [facility]  September 11, 2010 2:04 PM   Financial assistance approved for 100% discount at [facility] and has [facility] card   [name]  October 04, 2010 5:29 PM    Objective:  Physical Exam: Filed  Vitals:   05/26/14 1400  BP: 94/70  Pulse: 73  Temp: 97.4 F (36.3 C)  TempSrc: Oral  Weight: 204 lb 3.2 oz (92.625 kg)  SpO2: 95%   General: no distress, appears as stated age HEENT: PERRL, EOMI, no scleral icterus Cardiac: RRR, no rubs, murmurs or gallops  Pulm: clear to auscultation bilaterally, moving normal volumes of air Abd: soft, nontender, nondistended, BS present Ext: warm and well perfused, no pedal edema, no significant ballotment/effusion palpable on b/l knees, negative anterior and posterior drawer tests of b/l knees, negative mcmurray test b/l Neuro: alert and oriented X3, cranial nerves II-XII grossly intact  Assessment & Plan:  Case and care discussed with Dr. Dareen Piano.  Please see problem oriented charting for further details. Patient to return in 1 month for blood pressure follow up.

## 2014-05-26 NOTE — Assessment & Plan Note (Addendum)
Continues to smoke.  She reports progressive numbness in b/l legs, which is likely secondary to her PVD.  She last saw vascular in 08/2013, and is due for 6 month follow up.  I gave patient Dr. Nona Dell office number for her to schedule appt per his OV note for repeat ABIs.    -->also plan to check A1c at next visit, was 6.5 in 2012

## 2014-05-27 NOTE — Progress Notes (Signed)
INTERNAL MEDICINE TEACHING ATTENDING ADDENDUM - Aldine Contes, MD: I reviewed and discussed with the resident Dr. Burnard Bunting, the patient's medical history, physical examination, diagnosis and results of pertinent tests and treatment and I agree with the patient's care as documented.

## 2014-06-06 ENCOUNTER — Encounter (HOSPITAL_COMMUNITY): Payer: Self-pay | Admitting: Emergency Medicine

## 2014-06-06 ENCOUNTER — Emergency Department (HOSPITAL_COMMUNITY)
Admission: EM | Admit: 2014-06-06 | Discharge: 2014-06-06 | Disposition: A | Discharge status: Home / Self Care | Payer: Medicare Other | Attending: Emergency Medicine | Admitting: Emergency Medicine

## 2014-06-06 DIAGNOSIS — Z7982 Long term (current) use of aspirin: Secondary | ICD-10-CM | POA: Diagnosis not present

## 2014-06-06 DIAGNOSIS — I739 Peripheral vascular disease, unspecified: Secondary | ICD-10-CM | POA: Diagnosis not present

## 2014-06-06 DIAGNOSIS — G2581 Restless legs syndrome: Secondary | ICD-10-CM | POA: Diagnosis not present

## 2014-06-06 DIAGNOSIS — K5732 Diverticulitis of large intestine without perforation or abscess without bleeding: Secondary | ICD-10-CM | POA: Diagnosis not present

## 2014-06-06 DIAGNOSIS — IMO0002 Reserved for concepts with insufficient information to code with codable children: Secondary | ICD-10-CM | POA: Insufficient documentation

## 2014-06-06 DIAGNOSIS — Z79899 Other long term (current) drug therapy: Secondary | ICD-10-CM | POA: Insufficient documentation

## 2014-06-06 DIAGNOSIS — F411 Generalized anxiety disorder: Secondary | ICD-10-CM | POA: Diagnosis not present

## 2014-06-06 DIAGNOSIS — K0889 Other specified disorders of teeth and supporting structures: Secondary | ICD-10-CM

## 2014-06-06 DIAGNOSIS — Z7901 Long term (current) use of anticoagulants: Secondary | ICD-10-CM | POA: Insufficient documentation

## 2014-06-06 DIAGNOSIS — G894 Chronic pain syndrome: Secondary | ICD-10-CM | POA: Insufficient documentation

## 2014-06-06 DIAGNOSIS — M1710 Unilateral primary osteoarthritis, unspecified knee: Secondary | ICD-10-CM | POA: Insufficient documentation

## 2014-06-06 DIAGNOSIS — K089 Disorder of teeth and supporting structures, unspecified: Secondary | ICD-10-CM | POA: Diagnosis not present

## 2014-06-06 DIAGNOSIS — I10 Essential (primary) hypertension: Secondary | ICD-10-CM | POA: Diagnosis not present

## 2014-06-06 DIAGNOSIS — N3946 Mixed incontinence: Secondary | ICD-10-CM | POA: Diagnosis not present

## 2014-06-06 DIAGNOSIS — Z888 Allergy status to other drugs, medicaments and biological substances status: Secondary | ICD-10-CM | POA: Insufficient documentation

## 2014-06-06 DIAGNOSIS — Z86718 Personal history of other venous thrombosis and embolism: Secondary | ICD-10-CM | POA: Insufficient documentation

## 2014-06-06 DIAGNOSIS — F17200 Nicotine dependence, unspecified, uncomplicated: Secondary | ICD-10-CM | POA: Diagnosis not present

## 2014-06-06 DIAGNOSIS — M170 Bilateral primary osteoarthritis of knee: Secondary | ICD-10-CM

## 2014-06-06 LAB — CBC WITH DIFFERENTIAL/PLATELET
BASOS ABS: 0 10*3/uL (ref 0.0–0.1)
BASOS PCT: 1 % (ref 0–1)
EOS ABS: 0.3 10*3/uL (ref 0.0–0.7)
Eosinophils Relative: 5 % (ref 0–5)
HCT: 40.6 % (ref 36.0–46.0)
HEMOGLOBIN: 13.6 g/dL (ref 12.0–15.0)
Lymphocytes Relative: 47 % — ABNORMAL HIGH (ref 12–46)
Lymphs Abs: 3.2 10*3/uL (ref 0.7–4.0)
MCH: 32.9 pg (ref 26.0–34.0)
MCHC: 33.5 g/dL (ref 30.0–36.0)
MCV: 98.1 fL (ref 78.0–100.0)
MONOS PCT: 9 % (ref 3–12)
Monocytes Absolute: 0.6 10*3/uL (ref 0.1–1.0)
NEUTROS ABS: 2.5 10*3/uL (ref 1.7–7.7)
NEUTROS PCT: 38 % — AB (ref 43–77)
PLATELETS: 168 10*3/uL (ref 150–400)
RBC: 4.14 MIL/uL (ref 3.87–5.11)
RDW: 13 % (ref 11.5–15.5)
WBC: 6.7 10*3/uL (ref 4.0–10.5)

## 2014-06-06 LAB — PROTIME-INR
INR: 3.28 — AB (ref 0.00–1.49)
PROTHROMBIN TIME: 32.2 s — AB (ref 11.6–15.2)

## 2014-06-06 LAB — BASIC METABOLIC PANEL
BUN: 14 mg/dL (ref 6–23)
CO2: 30 mEq/L (ref 19–32)
Calcium: 9.3 mg/dL (ref 8.4–10.5)
Chloride: 104 mEq/L (ref 96–112)
Creatinine, Ser: 0.95 mg/dL (ref 0.50–1.10)
GFR, EST AFRICAN AMERICAN: 79 mL/min — AB (ref >90)
GFR, EST NON AFRICAN AMERICAN: 68 mL/min — AB (ref >90)
GLUCOSE: 87 mg/dL (ref 70–99)
POTASSIUM: 3.5 meq/L — AB (ref 3.7–5.3)
Sodium: 145 mEq/L (ref 137–147)

## 2014-06-06 MED ORDER — OXYCODONE-ACETAMINOPHEN 5-325 MG PO TABS
2.0000 | ORAL_TABLET | ORAL | Status: DC | PRN
Start: 2014-06-06 — End: 2014-07-08

## 2014-06-06 MED ORDER — OXYCODONE-ACETAMINOPHEN 5-325 MG PO TABS
2.0000 | ORAL_TABLET | Freq: Once | ORAL | Status: AC
  Administered 2014-06-06: 2 via ORAL
  Filled 2014-06-06: qty 2

## 2014-06-06 NOTE — ED Provider Notes (Signed)
CSN: [id number]     Arrival date & time 06/06/14  0004 History   First MD Initiated Contact with Patient 06/06/14 0215     Chief Complaint  Patient presents with  . Dental Problem  . Leg Pain  . Knee Pain     (Consider location/radiation/quality/duration/timing/severity/associated sxs/prior Treatment) HPI 53 year old female presents to emergency department from home with complaint of acute on chronic lower extremity pain and right front tooth pain and looseness.  Patient has history of chronic knee pain from osteoarthritis.  She also has peripheral vascular disease.  She reports that she has run out of the Percocet given to her by Dr. Brantley Stage.  She reports over the last few days the pain has been severe and has been limiting her activity.  Her right front tooth is loose and painful.  She has a dentist that she has not yet followed up with.  She has noted discoloration to the tooth and was hoping that it wasn't going to have to come out.  No fevers or chills no swelling in the mouth no change in her baseline pain other than increased intensity. Past Medical History  Diagnosis Date  . DVT (deep venous thrombosis)     bilateral, 2 episodes: Requires lifelong therapy  . Chronic pain syndrome   . Restless leg syndrome   . Depression   . Hyperlipidemia   . Hypertension   . PVD (peripheral vascular disease)     s/p L fem-pop bypass 11/21/08; graft occluded 12/28/08;  aortogram w/ bilat LE runoff: 1.  bilat diffuse SFA occlusive dz, 2.  Mod to severe above-knee popliteal dz,  3.  Bilat 3-vessel runoff w/ mild tibial occlusive dz.  . Tobacco abuse   . Degenerative joint disease of knee   . Breast lump 02/2008    Biopsy 05/2008: showed no evidence of malignancy  . Irregular menses 9/08  . Pulmonary edema   . Anxiety   . Joint pain   . Mixed stress and urge urinary incontinence     Being followed by [facility], underwent cystoscopy & uroflowmetry   . Endometrial mass 10/12/2012     Endometrium, biopsy on 11/04/12 - PROLIFERATIVE ENDOMETRIUM AND ABUNDANT MUCUS. NO HYPERPLASIA OR CARCINOMA.   . Sigmoid diverticulitis 10/26/2012  . Asthma   . Breast mass in female     bi lat    Past Surgical History  Procedure Laterality Date  . Femoral bypass  11/09  .  right external iliac artery stent    . Right breast needle-localized lumpectomy    . Pr vein bypass graft,aorto-fem-pop    . Carotid endarterectomy    . Tubal ligation    . Tonsillectomy    . Cardiac catheterization  2/15  . Breast lumpectomy with radioactive seed localization Bilateral 05/03/2014    Procedure: BREAST LUMPECTOMY WITH RADIOACTIVE SEED LOCALIZATION;  Surgeon: Joyice Faster. Cornett, MD;  Location: [facility];  Service: General;  Laterality: Bilateral;   Family History  Problem Relation Age of Onset  . Breast cancer Mother   . Colon cancer Neg Hx   . Rectal cancer Neg Hx   . Stomach cancer Neg Hx   . Breast cancer Other     GREAT AUNT   History  Substance Use Topics  . Smoking status: Current Every Day Smoker -- 0.25 packs/day for 18 years    Types: Cigarettes  . Smokeless tobacco: Never Used     Comment: 3-6 per day  . Alcohol Use: Yes  Comment: rare   OB History   Grav Para Term Preterm Abortions TAB SAB Ect Mult Living   6 4 4  2 1 1   4      Review of Systems  See History of Present Illness; otherwise all other systems are reviewed and negative   Allergies  Propoxyphene n-acetaminophen  Home Medications   Prior to Admission medications   Medication Sig Start Date End Date Taking? Authorizing Provider  albuterol (PROVENTIL HFA;VENTOLIN HFA) 108 (90 BASE) MCG/ACT inhaler Inhale 1 puff into the lungs every 4 (four) hours as needed for wheezing or shortness of breath.   Yes Historical Provider, MD  aspirin 81 MG tablet Take 1 tablet (81 mg total) by mouth daily. 10/01/11  Yes Burman Freestone, MD  atorvastatin (LIPITOR) 40 MG tablet Take 1 tablet (40 mg total) by mouth  at bedtime. 11/05/13  Yes Ejiroghene Emokpae, MD  buPROPion (WELLBUTRIN XL) 150 MG 24 hr tablet Take 150 mg by mouth daily.   Yes Historical Provider, MD  carvedilol (COREG) 25 MG tablet Take 1 tablet (25 mg total) by mouth 2 (two) times daily with a meal. 02/28/14  Yes Karren Cobble, MD  diazepam (VALIUM) 5 MG tablet Take 10 mg by mouth 3 (three) times daily.    Yes Historical Provider, MD  DULoxetine (CYMBALTA) 60 MG capsule Take 60 mg by mouth 2 (two) times daily.   Yes Historical Provider, MD  gabapentin (NEURONTIN) 800 MG tablet Take 800-1,600 mg by mouth See admin instructions. Take 1 tablet in the morning, 1 tablet midday and 2 tablets at bedtime   Yes Historical Provider, MD  hydrOXYzine (ATARAX/VISTARIL) 25 MG tablet Take 25 mg by mouth 3 (three) times daily as needed for itching.    Yes Historical Provider, MD  lisinopril-hydrochlorothiazide (PRINZIDE,ZESTORETIC) 20-12.5 MG per tablet Take 2 tablets by mouth daily.   Yes Historical Provider, MD  oxyCODONE-acetaminophen (ROXICET) 5-325 MG per tablet Take 1 tablet by mouth every 4 (four) hours as needed. 05/03/14  Yes Thomas A. Cornett, MD  potassium chloride (KLOR-CON 10) 10 MEQ tablet Take 1 tablet (10 mEq total) by mouth daily. 02/22/14  Yes Larey Dresser, MD  traZODone (DESYREL) 100 MG tablet Take 300 mg by mouth at bedtime.    Yes Clarene Reamer, MD  warfarin (COUMADIN) 7.5 MG tablet Take 1 tablet (7.5 mg total) by mouth daily at 6 PM. 05/05/14  Yes Marcello Moores A. Cornett, MD  ziprasidone (GEODON) 60 MG capsule Take 60 mg by mouth 2 (two) times daily with a meal.    Yes Historical Provider, MD  oxyCODONE-acetaminophen (PERCOCET/ROXICET) 5-325 MG per tablet Take 2 tablets by mouth every 4 (four) hours as needed for severe pain. 06/06/14   Kalman Drape, MD   BP 139/55  Pulse 72  Temp(Src) 97.7 F (36.5 C) (Oral)  Resp 14  Ht 5\' 4"  (1.626 m)  Wt 180 lb (81.647 kg)  BMI 30.88 kg/m2  SpO2 98%  LMP 06/03/2014 Physical Exam  Nursing note and  vitals reviewed. Constitutional: She is oriented to person, place, and time. She appears well-developed and well-nourished.  Patient is somnolent, has taken her nighttime medicine  HENT:  Head: Normocephalic and atraumatic.  Nose: Nose normal.  Mouth/Throat: Oropharynx is clear and moist.  Patient has a discoloration of her right front incisor.  This tooth is loose.  Palpation around the tooth and the tooth itself causes pain.  Eyes: Conjunctivae and EOM are normal. Pupils are equal, round,  and reactive to light.  Neck: Normal range of motion. Neck supple. No JVD present. No tracheal deviation present. No thyromegaly present.  Cardiovascular: Normal rate, regular rhythm, normal heart sounds and intact distal pulses.  Exam reveals no gallop and no friction rub.   No murmur heard. Pulmonary/Chest: Effort normal and breath sounds normal. No stridor. No respiratory distress. She has no wheezes. She has no rales. She exhibits no tenderness.  Abdominal: Soft. Bowel sounds are normal. She exhibits no distension and no mass. There is no tenderness. There is no rebound and no guarding.  Musculoskeletal: Normal range of motion. She exhibits tenderness (patient reports diffuse tenderness to palpation over entire legs.  No crepitus, no effusion, no deformity to knees noted pulses are intact.  Sensation intact). She exhibits no edema.  Lymphadenopathy:    She has no cervical adenopathy.  Neurological: She is alert and oriented to person, place, and time. No cranial nerve deficit. She exhibits normal muscle tone. Coordination normal.  Skin: Skin is warm and dry. No rash noted. No erythema. No pallor.  Psychiatric: She has a normal mood and affect. Her behavior is normal. Judgment and thought content normal.    ED Course  Procedures (including critical care time) Labs Review Labs Reviewed  BASIC METABOLIC PANEL - Abnormal; Notable for the following:    Potassium 3.5 (*)    GFR calc non Af Amer 68 (*)     GFR calc Af Amer 79 (*)    All other components within normal limits  CBC WITH DIFFERENTIAL - Abnormal; Notable for the following:    Neutrophils Relative % 38 (*)    Lymphocytes Relative 47 (*)    All other components within normal limits  PROTIME-INR - Abnormal; Notable for the following:    Prothrombin Time 32.2 (*)    INR 3.28 (*)    All other components within normal limits    Imaging Review No results found.   EKG Interpretation None      MDM   Final diagnoses:  Pain, dental  Osteoarthritis of both knees    53 year old female with acute on chronic pain to her lower extremities.  No acute vascular compromise noted on exam.  Will give short course of pain medication, have her followup with primary care Dr. and orthopedics as well as pain management clinic as she is oriented to with her primary care Dr.  Patient also strongly encouraged followup with her dentist to have her tooth issues addressed.    Kalman Drape, MD 06/06/14 [phone]

## 2014-06-06 NOTE — Discharge Instructions (Signed)
Your coumadin level was slightly high today, INR 3.28.  Hold your dose of coumadin today.  Take pain medication as prescribed.  Please see your dentist as soon as possible.  Follow up with your referrals to ortho and pain clinic.   Dental Pain Toothache is pain in or around a tooth. It may get worse with chewing or with cold or heat.  HOME CARE  Your dentist may use a numbing medicine during treatment. If so, you may need to avoid eating until the medicine wears off. Ask your dentist about this.  Only take medicine as told by your dentist or doctor.  Avoid chewing food near the painful tooth until after all treatment is done. Ask your dentist about this. GET HELP RIGHT AWAY IF:   The problem gets worse or new problems appear.  You have a fever.  There is redness and puffiness (swelling) of the face, jaw, or neck.  You cannot open your mouth.  There is pain in the jaw.  There is very bad pain that is not helped by medicine. MAKE SURE YOU:   Understand these instructions.  Will watch your condition.  Will get help right away if you are not doing well or get worse. Document Released: 06/03/2008 Document Revised: 03/09/2012 Document Reviewed: 06/03/2008 [facility] Patient Information 2014 [place], [place].  Arthritis, Nonspecific Arthritis is inflammation of a joint. This usually means pain, redness, warmth or swelling are present. One or more joints may be involved. There are a number of types of arthritis. Your caregiver may not be able to tell what type of arthritis you have right away. CAUSES  The most common cause of arthritis is the wear and tear on the joint (osteoarthritis). This causes damage to the cartilage, which can break down over time. The knees, hips, back and neck are most often affected by this type of arthritis. Other types of arthritis and common causes of joint pain include:  Sprains and other injuries near the joint. Sometimes minor sprains and injuries cause  pain and swelling that develop hours later.  Rheumatoid arthritis. This affects hands, feet and knees. It usually affects both sides of your body at the same time. It is often associated with chronic ailments, fever, weight loss and general weakness.  Crystal arthritis. Gout and pseudo gout can cause occasional acute severe pain, redness and swelling in the foot, ankle, or knee.  Infectious arthritis. Bacteria can get into a joint through a break in overlying skin. This can cause infection of the joint. Bacteria and viruses can also spread through the blood and affect your joints.  Drug, infectious and allergy reactions. Sometimes joints can become mildly painful and slightly swollen with these types of illnesses. SYMPTOMS   Pain is the main symptom.  Your joint or joints can also be red, swollen and warm or hot to the touch.  You may have a fever with certain types of arthritis, or even feel overall ill.  The joint with arthritis will hurt with movement. Stiffness is present with some types of arthritis. DIAGNOSIS  Your caregiver will suspect arthritis based on your description of your symptoms and on your exam. Testing may be needed to find the type of arthritis:  Blood and sometimes urine tests.  X-ray tests and sometimes CT or MRI scans.  Removal of fluid from the joint (arthrocentesis) is done to check for bacteria, crystals or other causes. Your caregiver (or a specialist) will numb the area over the joint with a local anesthetic,  and use a needle to remove joint fluid for examination. This procedure is only minimally uncomfortable.  Even with these tests, your caregiver may not be able to tell what kind of arthritis you have. Consultation with a specialist (rheumatologist) may be helpful. TREATMENT  Your caregiver will discuss with you treatment specific to your type of arthritis. If the specific type cannot be determined, then the following general recommendations may  apply. Treatment of severe joint pain includes:  Rest.  Elevation.  Anti-inflammatory medication (for example, ibuprofen) may be prescribed. Avoiding activities that cause increased pain.  Only take over-the-counter or prescription medicines for pain and discomfort as recommended by your caregiver.  Cold packs over an inflamed joint may be used for 10 to 15 minutes every hour. Hot packs sometimes feel better, but do not use overnight. Do not use hot packs if you are diabetic without your caregiver's permission.  A cortisone shot into arthritic joints may help reduce pain and swelling.  Any acute arthritis that gets worse over the next 1 to 2 days needs to be looked at to be sure there is no joint infection. Long-term arthritis treatment involves modifying activities and lifestyle to reduce joint stress jarring. This can include weight loss. Also, exercise is needed to nourish the joint cartilage and remove waste. This helps keep the muscles around the joint strong. HOME CARE INSTRUCTIONS   Do not take aspirin to relieve pain if gout is suspected. This elevates uric acid levels.  Only take over-the-counter or prescription medicines for pain, discomfort or fever as directed by your caregiver.  Rest the joint as much as possible.  If your joint is swollen, keep it elevated.  Use crutches if the painful joint is in your leg.  Drinking plenty of fluids may help for certain types of arthritis.  Follow your caregiver's dietary instructions.  Try low-impact exercise such as:  Swimming.  Water aerobics.  Biking.  Walking.  Morning stiffness is often relieved by a warm shower.  Put your joints through regular range-of-motion. SEEK MEDICAL CARE IF:   You do not feel better in 24 hours or are getting worse.  You have side effects to medications, or are not getting better with treatment. SEEK IMMEDIATE MEDICAL CARE IF:   You have a fever.  You develop severe joint pain,  swelling or redness.  Many joints are involved and become painful and swollen.  There is severe back pain and/or leg weakness.  You have loss of bowel or bladder control. Document Released: 01/23/2005 Document Revised: 03/09/2012 Document Reviewed: 02/08/2009 [facility] Patient Information 2014 [place].

## 2014-06-06 NOTE — ED Notes (Addendum)
Reports h/o RA and PAD. C/o chronic leg pain. Mostly in knees. "Here tonight for pain worse, wakes me up". Also have a loose tooth. Also tooth pain (front upper R canine tooth) loose and painful. Rates pain 9/10 legs and tooth. "leg pain on a daily basis since CABG in 2007". Takes coumadin. No pain meds PTA. "Legs more bother some than tooth". "Legs want to buckle when I walk". CMS intact. MAEx4.

## 2014-06-13 ENCOUNTER — Encounter (INDEPENDENT_AMBULATORY_CARE_PROVIDER_SITE_OTHER): Payer: Medicare Other | Admitting: Surgery

## 2014-06-13 ENCOUNTER — Ambulatory Visit (INDEPENDENT_AMBULATORY_CARE_PROVIDER_SITE_OTHER): Payer: Medicare Other | Admitting: Pharmacist

## 2014-06-13 DIAGNOSIS — I82409 Acute embolism and thrombosis of unspecified deep veins of unspecified lower extremity: Secondary | ICD-10-CM | POA: Diagnosis not present

## 2014-06-13 DIAGNOSIS — Z7901 Long term (current) use of anticoagulants: Secondary | ICD-10-CM

## 2014-06-13 DIAGNOSIS — N63 Unspecified lump in unspecified breast: Secondary | ICD-10-CM | POA: Diagnosis not present

## 2014-06-13 LAB — POCT INR: INR: 1.6

## 2014-06-13 NOTE — Patient Instructions (Signed)
Patient instructed to take medications as defined in the Anti-coagulation Track section of this encounter.  Patient instructed to take today's dose.  Patient verbalized understanding of these instructions.    

## 2014-06-13 NOTE — Progress Notes (Signed)
Anti-Coagulation Progress Note  Alisha [name] is a 53 y.o. female who is currently on an anti-coagulation regimen.    RECENT RESULTS: Recent results are below, the most recent result is correlated with a dose of 63.75 mg. per week:She had ONE omitted dose as per instructed by ED physician in response to an INR = 3.71 found in the ED upon a visit for arthritic pain on 10-JUN-15. Lab Results  Component Value Date   INR 1.60 06/13/2014   INR 3.28* 06/06/2014   INR 2.7 05/16/2014    ANTI-COAG DOSE: Anticoagulation Dose Instructions as of 06/13/2014     Dorene Grebe Tue Wed Thu Fri Sat   New Dose 11.25 mg 7.5 mg 11.25 mg 7.5 mg 11.25 mg 7.5 mg 15 mg       ANTICOAG SUMMARY: Anticoagulation Episode Summary   Current INR goal 2.0-3.0  Next INR check 07/04/2014  INR from last check 1.60! (06/13/2014)  Weekly max dose   Target end date   [facility] check [facility]  Preferred lab   Send INR reminders to ANTICOAG IMP   Indications  DVT [453.40] Long term current use of anticoagulant [V58.61]        Comments       Anticoagulation Care Providers   Provider Role Specialty Phone number   Burman Freestone, MD  Internal Medicine [phone]      ANTICOAG TODAY: Anticoagulation Summary as of 06/13/2014   INR goal 2.0-3.0  Selected INR 1.60! (06/13/2014)  Next INR check 07/04/2014  Target end date    Indications  DVT [453.40] Long term current use of anticoagulant [V58.61]      Anticoagulation Episode Summary   [facility] check [facility]   Preferred lab    Send INR reminders to ANTICOAG IMP   Comments     Anticoagulation Care Providers   Provider Role Specialty Phone number   Burman Freestone, MD  Internal Medicine [phone]      PATIENT INSTRUCTIONS: Patient Instructions  Patient instructed to take medications as defined in the Anti-coagulation Track section of this encounter.  Patient instructed to take today's dose.  Patient verbalized understanding of these  instructions.       FOLLOW-UP Return in 3 weeks (on 07/04/2014) for Follow up INR at 1100h.  Jorene Guest, III Pharm.D., CACP

## 2014-06-18 ENCOUNTER — Emergency Department (HOSPITAL_COMMUNITY)
Admission: EM | Admit: 2014-06-18 | Discharge: 2014-06-18 | Disposition: A | Discharge status: Home / Self Care | Payer: Medicare Other | Attending: Emergency Medicine | Admitting: Emergency Medicine

## 2014-06-18 ENCOUNTER — Encounter (HOSPITAL_COMMUNITY): Payer: Self-pay | Admitting: Emergency Medicine

## 2014-06-18 DIAGNOSIS — Z86718 Personal history of other venous thrombosis and embolism: Secondary | ICD-10-CM | POA: Diagnosis not present

## 2014-06-18 DIAGNOSIS — Z7901 Long term (current) use of anticoagulants: Secondary | ICD-10-CM | POA: Insufficient documentation

## 2014-06-18 DIAGNOSIS — F329 Major depressive disorder, single episode, unspecified: Secondary | ICD-10-CM | POA: Diagnosis not present

## 2014-06-18 DIAGNOSIS — E785 Hyperlipidemia, unspecified: Secondary | ICD-10-CM | POA: Insufficient documentation

## 2014-06-18 DIAGNOSIS — Z79899 Other long term (current) drug therapy: Secondary | ICD-10-CM | POA: Insufficient documentation

## 2014-06-18 DIAGNOSIS — F17200 Nicotine dependence, unspecified, uncomplicated: Secondary | ICD-10-CM | POA: Diagnosis not present

## 2014-06-18 DIAGNOSIS — Z8719 Personal history of other diseases of the digestive system: Secondary | ICD-10-CM | POA: Diagnosis not present

## 2014-06-18 DIAGNOSIS — Z8742 Personal history of other diseases of the female genital tract: Secondary | ICD-10-CM | POA: Diagnosis not present

## 2014-06-18 DIAGNOSIS — Z7902 Long term (current) use of antithrombotics/antiplatelets: Secondary | ICD-10-CM | POA: Diagnosis not present

## 2014-06-18 DIAGNOSIS — M25562 Pain in left knee: Secondary | ICD-10-CM

## 2014-06-18 DIAGNOSIS — I82409 Acute embolism and thrombosis of unspecified deep veins of unspecified lower extremity: Secondary | ICD-10-CM | POA: Insufficient documentation

## 2014-06-18 DIAGNOSIS — F3289 Other specified depressive episodes: Secondary | ICD-10-CM | POA: Diagnosis not present

## 2014-06-18 DIAGNOSIS — J45909 Unspecified asthma, uncomplicated: Secondary | ICD-10-CM | POA: Diagnosis not present

## 2014-06-18 DIAGNOSIS — G894 Chronic pain syndrome: Secondary | ICD-10-CM | POA: Diagnosis not present

## 2014-06-18 DIAGNOSIS — I10 Essential (primary) hypertension: Secondary | ICD-10-CM | POA: Insufficient documentation

## 2014-06-18 DIAGNOSIS — M25569 Pain in unspecified knee: Secondary | ICD-10-CM | POA: Insufficient documentation

## 2014-06-18 DIAGNOSIS — Z8709 Personal history of other diseases of the respiratory system: Secondary | ICD-10-CM | POA: Diagnosis not present

## 2014-06-18 DIAGNOSIS — Z8739 Personal history of other diseases of the musculoskeletal system and connective tissue: Secondary | ICD-10-CM | POA: Insufficient documentation

## 2014-06-18 DIAGNOSIS — G8929 Other chronic pain: Secondary | ICD-10-CM

## 2014-06-18 DIAGNOSIS — F411 Generalized anxiety disorder: Secondary | ICD-10-CM | POA: Diagnosis not present

## 2014-06-18 DIAGNOSIS — M25561 Pain in right knee: Secondary | ICD-10-CM

## 2014-06-18 MED ORDER — HYDROCODONE-ACETAMINOPHEN 5-325 MG PO TABS
2.0000 | ORAL_TABLET | Freq: Once | ORAL | Status: AC
  Administered 2014-06-18: 2 via ORAL
  Filled 2014-06-18: qty 2

## 2014-06-18 MED ORDER — HYDROCODONE-ACETAMINOPHEN 5-325 MG PO TABS: 1.0000 | ORAL_TABLET | Freq: Four times a day (QID) | ORAL | Status: DC | PRN

## 2014-06-18 NOTE — ED Notes (Signed)
Pt reports chronic bilateral knee pain x 2008. Pt states increase in pain x 6 months progressively worse. Pt states "they are trying to get me into the pain clinic." States "I just need something for pain." NAD. AO x4.

## 2014-06-18 NOTE — ED Notes (Signed)
PT reports a HX of PVD  With bilateral leg and knee pain. Pt tearful family with PT.

## 2014-06-18 NOTE — ED Provider Notes (Signed)
CSN: [id number]     Arrival date & time 06/18/14  2125 History  This chart was scribed for non-physician practitioner working with Ezequiel Essex, MD by Mercy Moore, ED Scribe. This patient was seen in room TR10C/TR10C and the patient's care was started at 10:29 PM.   Chief Complaint  Patient presents with  . Knee Pain  . Leg Pain     The history is provided by the patient. No language interpreter was used.   HPI Comments: Alisha [name] is a 53 y.o. female with history of chronic bilateral knee pain since 2008 and arthritis who presents to the Emergency Department complaining of progressively worsening knee pain ongoing for the last three months. Patient's pain has become unbearable and she desires medication to relief her pain. Patient describes burning knee pain and with associated swelling. Patient reports instability with standing. Patient taking Neurontin for pain. States that her PCP told her there is not much to be done for her artery disease; pain management is her only option. Patient reports attempts to join a pain clinic. She denies acute knee injury.  Denies fever, chills, new numbness or tingling.  She denies any erythema or warmth of the knees or legs.   Past Medical History  Diagnosis Date  . DVT (deep venous thrombosis)     bilateral, 2 episodes: Requires lifelong therapy  . Chronic pain syndrome   . Restless leg syndrome   . Depression   . Hyperlipidemia   . Hypertension   . PVD (peripheral vascular disease)     s/p L fem-pop bypass 11/21/08; graft occluded 12/28/08;  aortogram w/ bilat LE runoff: 1.  bilat diffuse SFA occlusive dz, 2.  Mod to severe above-knee popliteal dz,  3.  Bilat 3-vessel runoff w/ mild tibial occlusive dz.  . Tobacco abuse   . Degenerative joint disease of knee   . Breast lump 02/2008    Biopsy 05/2008: showed no evidence of malignancy  . Irregular menses 9/08  . Pulmonary edema   . Anxiety   . Joint pain   . Mixed stress and urge urinary  incontinence     [facility], underwent cystoscopy & uroflowmetry   . Endometrial mass 10/12/2012    Endometrium, biopsy on 11/04/12 - PROLIFERATIVE ENDOMETRIUM AND ABUNDANT MUCUS. NO HYPERPLASIA OR CARCINOMA.   . Sigmoid diverticulitis 10/26/2012  . Asthma   . Breast mass in female     bi lat    Past Surgical History  Procedure Laterality Date  . Femoral bypass  11/09  .  right external iliac artery stent    . Right breast needle-localized lumpectomy    . Pr vein bypass graft,aorto-fem-pop    . Carotid endarterectomy    . Tubal ligation    . Tonsillectomy    . Cardiac catheterization  2/15  . Breast lumpectomy with radioactive seed localization Bilateral 05/03/2014    Procedure: BREAST LUMPECTOMY WITH RADIOACTIVE SEED LOCALIZATION;  Surgeon: Joyice Faster. Cornett, MD;  Location: [facility];  Service: General;  Laterality: Bilateral;   Family History  Problem Relation Age of Onset  . Breast cancer Mother   . Colon cancer Neg Hx   . Rectal cancer Neg Hx   . Stomach cancer Neg Hx   . Breast cancer Other     GREAT AUNT   History  Substance Use Topics  . Smoking status: Current Every Day Smoker -- 0.25 packs/day for 18 years    Types: Cigarettes  .  Smokeless tobacco: Never Used     Comment: 3-6 per day  . Alcohol Use: Yes     Comment: rare   OB History   Grav Para Term Preterm Abortions TAB SAB Ect Mult Living   6 4 4  2 1 1   4      Review of Systems  Constitutional: Negative for fever and chills.  Musculoskeletal: Positive for arthralgias.  Neurological: Negative for weakness and numbness.      Allergies  Propoxyphene n-acetaminophen  Home Medications   Prior to Admission medications   Medication Sig Start Date End Date Taking? Authorizing Provider  albuterol (PROVENTIL HFA;VENTOLIN HFA) 108 (90 BASE) MCG/ACT inhaler Inhale 1 puff into the lungs every 4 (four) hours as needed for wheezing or shortness of breath.    Historical  Provider, MD  aspirin 81 MG tablet Take 1 tablet (81 mg total) by mouth daily. 10/01/11   Burman Freestone, MD  atorvastatin (LIPITOR) 40 MG tablet Take 1 tablet (40 mg total) by mouth at bedtime. 11/05/13   Ejiroghene Emokpae, MD  buPROPion (WELLBUTRIN XL) 150 MG 24 hr tablet Take 150 mg by mouth daily.    Historical Provider, MD  carvedilol (COREG) 25 MG tablet Take 1 tablet (25 mg total) by mouth 2 (two) times daily with a meal. 02/28/14   Karren Cobble, MD  diazepam (VALIUM) 5 MG tablet Take 10 mg by mouth 3 (three) times daily.     Historical Provider, MD  DULoxetine (CYMBALTA) 60 MG capsule Take 60 mg by mouth 2 (two) times daily.    Historical Provider, MD  gabapentin (NEURONTIN) 800 MG tablet Take 800-1,600 mg by mouth See admin instructions. Take 1 tablet in the morning, 1 tablet midday and 2 tablets at bedtime    Historical Provider, MD  hydrOXYzine (ATARAX/VISTARIL) 25 MG tablet Take 25 mg by mouth 3 (three) times daily as needed for itching.     Historical Provider, MD  lisinopril-hydrochlorothiazide (PRINZIDE,ZESTORETIC) 20-12.5 MG per tablet Take 2 tablets by mouth daily.    Historical Provider, MD  oxyCODONE-acetaminophen (PERCOCET/ROXICET) 5-325 MG per tablet Take 2 tablets by mouth every 4 (four) hours as needed for severe pain. 06/06/14   Kalman Drape, MD  oxyCODONE-acetaminophen (ROXICET) 5-325 MG per tablet Take 1 tablet by mouth every 4 (four) hours as needed. 05/03/14   Thomas A. Cornett, MD  potassium chloride (KLOR-CON 10) 10 MEQ tablet Take 1 tablet (10 mEq total) by mouth daily. 02/22/14   Larey Dresser, MD  traZODone (DESYREL) 100 MG tablet Take 300 mg by mouth at bedtime.     Clarene Reamer, MD  warfarin (COUMADIN) 7.5 MG tablet Take 1 tablet (7.5 mg total) by mouth daily at 6 PM. 05/05/14   Joyice Faster. Cornett, MD  ziprasidone (GEODON) 60 MG capsule Take 60 mg by mouth 2 (two) times daily with a meal.     Historical Provider, MD   Triage Vitals: BP 150/98  Pulse 82  Temp(Src)  97.9 F (36.6 C) (Oral)  Resp 18  SpO2 98%  LMP 06/03/2014 Physical Exam  Nursing note and vitals reviewed. Constitutional: She is oriented to person, place, and time. She appears well-developed and well-nourished. No distress.  HENT:  Head: Normocephalic and atraumatic.  Eyes: EOM are normal.  Neck: Neck supple.  Cardiovascular: Normal rate, regular rhythm, normal heart sounds and intact distal pulses.   Pulmonary/Chest: Effort normal and breath sounds normal. No respiratory distress.  Musculoskeletal: Normal range of motion.  She exhibits tenderness.  Diffuse tenderness to palpation of bilateral knees. No edema, erythema or warmth of the knee.  Faint DP pulse. Both feet feel warm.  Distal sensation of right foot intact. Decreased sensation of left foot which patient reports is chronic.   Neurological: She is alert and oriented to person, place, and time.  Skin: Skin is warm and dry.  Psychiatric: She has a normal mood and affect. Her behavior is normal.    ED Course  Procedures (including critical care time) COORDINATION OF CARE: 10:38 PM- Discussed treatment plan with patient at bedside and patient agreed to plan.   Labs Review Labs Reviewed - No data to display  Imaging Review No results found.   EKG Interpretation None      MDM   Final diagnoses:  None   Patient with a history of chronic knee pain presents today with knee pain.  No signs of infection on exam.  Patient afebrile.  No acute injury or trauma.  Feel that the patient is stable for discharge.  Patient instructed to follow up with PCP.  Return precautions given.  I personally performed the services described in this documentation, which was scribed in my presence. The recorded information has been reviewed and is accurate.    Hyman Bible, PA-C 06/21/14 0015

## 2014-06-21 NOTE — ED Provider Notes (Signed)
Medical screening examination/treatment/procedure(s) were performed by non-physician practitioner and as supervising physician I was immediately available for consultation/collaboration.   EKG Interpretation None        Ezequiel Essex, MD 06/21/14 [phone]

## 2014-06-23 ENCOUNTER — Ambulatory Visit (HOSPITAL_COMMUNITY)
Admission: RE | Admit: 2014-06-23 | Discharge: 2014-06-23 | Disposition: A | Discharge status: Home / Self Care | Payer: Medicare Other | Source: Ambulatory Visit | Attending: Internal Medicine | Admitting: Internal Medicine

## 2014-06-23 ENCOUNTER — Ambulatory Visit (INDEPENDENT_AMBULATORY_CARE_PROVIDER_SITE_OTHER): Payer: Medicare Other | Admitting: Internal Medicine

## 2014-06-23 ENCOUNTER — Encounter: Payer: Self-pay | Admitting: Internal Medicine

## 2014-06-23 VITALS — BP 110/78 | HR 67 | Temp 97.2°F | Ht 64.0 in | Wt 206.7 lb

## 2014-06-23 DIAGNOSIS — F17200 Nicotine dependence, unspecified, uncomplicated: Secondary | ICD-10-CM | POA: Diagnosis not present

## 2014-06-23 DIAGNOSIS — M25569 Pain in unspecified knee: Secondary | ICD-10-CM | POA: Diagnosis not present

## 2014-06-23 DIAGNOSIS — IMO0002 Reserved for concepts with insufficient information to code with codable children: Secondary | ICD-10-CM

## 2014-06-23 DIAGNOSIS — I10 Essential (primary) hypertension: Secondary | ICD-10-CM | POA: Diagnosis not present

## 2014-06-23 DIAGNOSIS — M1710 Unilateral primary osteoarthritis, unspecified knee: Secondary | ICD-10-CM | POA: Diagnosis not present

## 2014-06-23 DIAGNOSIS — G894 Chronic pain syndrome: Secondary | ICD-10-CM

## 2014-06-23 DIAGNOSIS — I739 Peripheral vascular disease, unspecified: Secondary | ICD-10-CM

## 2014-06-23 DIAGNOSIS — M170 Bilateral primary osteoarthritis of knee: Secondary | ICD-10-CM

## 2014-06-23 MED ORDER — NAPROXEN 500 MG PO TABS: 500.0000 mg | ORAL_TABLET | Freq: Two times a day (BID) | ORAL | Status: DC

## 2014-06-23 MED ORDER — POTASSIUM CHLORIDE ER 10 MEQ PO TBCR: 10.0000 meq | EXTENDED_RELEASE_TABLET | Freq: Every day | ORAL | Status: DC

## 2014-06-23 NOTE — Progress Notes (Signed)
Case discussed with Dr. Qureshi at the time of the visit.  We reviewed the resident's history and exam and pertinent patient test results.  I agree with the assessment, diagnosis, and plan of care documented in the resident's note. 

## 2014-06-23 NOTE — Progress Notes (Signed)
Subjective:   Patient ID: Alisha [name] female   DOB: Apr 11, 1961 53 y.o.   MRN: [id number]  HPI: [name] Surges is a 53 y.o. African American female with hx of DVT on lifelong coumadin, HTM, PVD s/p fem-pop bypass, depression, chronic pain syndrome, OA, and current tobacco use presenting to opc today for acute visit and ED follow up for worsening b/l knee pain. She was seen in the ER twice this month, 6/8 and 6/20 for her knee pain and both times treated with limited supply of norco that she says significantly improved her pain.  Chronic pain: b/l knees, hx of OA. She reports no improvement with tylenol in the past but does say naproxen has helped. She has missed 4 pain clinic appts in the past per scheduling and has been unable to follow up with them. She does not recall missing 4 appointments and says she knows she missed one and then has been waiting to hear back from them on future appointments. She was also requested to get b/l knee xrays on last visit with PCP in May that have not been done to date but we will get them done today if possible.  Additionally, there are several FYI's in regards to narcotic pain medication on the chart due to violations and prior substance abuse. She adamently refuses any current illicit drug use. Finally, she has also not followed up with Dr. Oneida Alar per recommendations of pcp on last visit as well, and she claims she never received a call from them, but it appears she was given the office number to call for follow up.   She is occasionally tearful today and in distress due to her pain. We discussed in length today about life style modifications, prior issues with pain contract violations that have prevented narcotic prescriptions from opc. I also recommended smoking cessation, weight loss, and following up with appointments and imaging that have been made by prior providers to ensure proper work up of her issues and hopefully have her feeling better.     Smoking--continues to smoke up to 1/2ppd per her aide with no intention to quit at this time. She says she will continue to smoke.  Past Medical History  Diagnosis Date  . DVT (deep venous thrombosis)     bilateral, 2 episodes: Requires lifelong therapy  . Chronic pain syndrome   . Restless leg syndrome   . Depression   . Hyperlipidemia   . Hypertension   . PVD (peripheral vascular disease)     s/p L fem-pop bypass 11/21/08; graft occluded 12/28/08;  aortogram w/ bilat LE runoff: 1.  bilat diffuse SFA occlusive dz, 2.  Mod to severe above-knee popliteal dz,  3.  Bilat 3-vessel runoff w/ mild tibial occlusive dz.  . Tobacco abuse   . Degenerative joint disease of knee   . Breast lump 02/2008    Biopsy 05/2008: showed no evidence of malignancy  . Irregular menses 9/08  . Pulmonary edema   . Anxiety   . Joint pain   . Mixed stress and urge urinary incontinence     [facility], underwent cystoscopy & uroflowmetry   . Endometrial mass 10/12/2012    Endometrium, biopsy on 11/04/12 - PROLIFERATIVE ENDOMETRIUM AND ABUNDANT MUCUS. NO HYPERPLASIA OR CARCINOMA.   . Sigmoid diverticulitis 10/26/2012  . Asthma   . Breast mass in female     bi lat    Current Outpatient Prescriptions  Medication Sig Dispense Refill  . albuterol (  PROVENTIL HFA;VENTOLIN HFA) 108 (90 BASE) MCG/ACT inhaler Inhale 1 puff into the lungs every 4 (four) hours as needed for wheezing or shortness of breath.      Marland Kitchen aspirin 81 MG tablet Take 1 tablet (81 mg total) by mouth daily.  100 tablet  3  . atorvastatin (LIPITOR) 40 MG tablet Take 1 tablet (40 mg total) by mouth at bedtime.  90 tablet  3  . buPROPion (WELLBUTRIN XL) 150 MG 24 hr tablet Take 150 mg by mouth daily.      . carvedilol (COREG) 25 MG tablet Take 1 tablet (25 mg total) by mouth 2 (two) times daily with a meal.  180 tablet  3  . diazepam (VALIUM) 5 MG tablet Take 10 mg by mouth 3 (three) times daily.       . DULoxetine (CYMBALTA)  60 MG capsule Take 60 mg by mouth 2 (two) times daily.      Marland Kitchen gabapentin (NEURONTIN) 800 MG tablet Take 800-1,600 mg by mouth See admin instructions. Take 1 tablet in the morning, 1 tablet midday and 2 tablets at bedtime      . hydrOXYzine (ATARAX/VISTARIL) 25 MG tablet Take 25 mg by mouth 3 (three) times daily as needed for itching.       Marland Kitchen lisinopril-hydrochlorothiazide (PRINZIDE,ZESTORETIC) 20-12.5 MG per tablet Take 2 tablets by mouth daily.      Marland Kitchen oxyCODONE-acetaminophen (PERCOCET/ROXICET) 5-325 MG per tablet Take 2 tablets by mouth every 4 (four) hours as needed for severe pain.  20 tablet  0  . potassium chloride (KLOR-CON 10) 10 MEQ tablet Take 1 tablet (10 mEq total) by mouth daily.  30 tablet  1  . traZODone (DESYREL) 100 MG tablet Take 300 mg by mouth at bedtime.       Marland Kitchen warfarin (COUMADIN) 7.5 MG tablet Take 1 tablet (7.5 mg total) by mouth daily at 6 PM.  90 tablet  2  . ziprasidone (GEODON) 60 MG capsule Take 60 mg by mouth 2 (two) times daily with a meal.       . naproxen (NAPROSYN) 500 MG tablet Take 1 tablet (500 mg total) by mouth 2 (two) times daily with a meal.  60 tablet  0   No current facility-administered medications for this visit.   Family History  Problem Relation Age of Onset  . Breast cancer Mother   . Colon cancer Neg Hx   . Rectal cancer Neg Hx   . Stomach cancer Neg Hx   . Breast cancer Other     GREAT AUNT   History   Social History  . Marital Status: Divorced    Spouse Name: N/A    Number of Children: 38  . Years of Education: N/A   Occupational History  . disable    Social History Main Topics  . Smoking status: Current Every Day Smoker -- 0.25 packs/day for 18 years    Types: Cigarettes  . Smokeless tobacco: Never Used     Comment: 3-6 per day  . Alcohol Use: Yes     Comment: rare  . Drug Use: No     Comment: hx of marijuana and cocaine use  . Sexual Activity: No   Other Topics Concern  . None   Social History Narrative   Surveyor, quantity initiated. Patient needs to submit further paperwork to complete   [facility]  September 11, 2010 2:04 PM   Financial assistance approved for 100% discount at [facility] and has [facility] card  [name]  October 04, 2010 5:29 PM   Review of Systems:  Constitutional:  Distressed due to pain  Respiratory:  Denies wheezing  Cardiovascular:  Denies chest pain  Gastrointestinal:  Denies abdominal pain  Musculoskeletal:  Chronic knee pain  Skin:  Denies pallor, rash and wound.   Neurological:  Occasional falls   Objective:  Physical Exam: Filed Vitals:   06/23/14 0929  BP: 110/78  Pulse: 67  Temp: 97.2 F (36.2 C)  TempSrc: Oral  Height: 5\' 4"  (1.626 m)  Weight: 206 lb 11.2 oz (93.759 kg)  SpO2: 97%   Vitals reviewed. General: sitting in wheelchair, occasionally tearful, distressed due to pain HEENT: EOMI Cardiac: RRR Pulm: clear to auscultation bilaterally, no wheezes, rales, or rhonchi Abd: soft, obese, BS present Ext: warm and well perfused, no pedal edema, moving b/l extremities, tenderness to palpation of b/l knees and mid shin, no warmth, erythema, swelling, or palpable crepitus of knees but endorses pain on movement. LLE scar Neuro: alert and oriented X3  Assessment & Plan:  Discussed with Dr. Marinda Elk Naproxen limited supply refilled for knee pain Sports medicine referral

## 2014-06-23 NOTE — Assessment & Plan Note (Addendum)
BP Readings from Last 3 Encounters:  06/23/14 110/78  06/18/14 188/119  06/06/14 139/55   Lab Results  Component Value Date   NA 145 06/06/2014   K 3.5* 06/06/2014   CREATININE 0.95 06/06/2014   Assessment: Blood pressure control: controlled Progress toward BP goal:  at goal Comments: took her BP medication today  Plan: Medications:  continue current medications coreg 25mg  bid and prinzide 20-12.5mg  2 tablets qd Educational resources provided:   Self management tools provided:   Other plans: bmet today given slight hypokalemia on past visit and refilled kdur 26meq  Addendum 06/24/14: Patient left opc prior to getting bmet done, consider following up on next visit (k was slightly low on prior bmet)

## 2014-06-23 NOTE — Assessment & Plan Note (Signed)
Recommended her to follow up with Dr. Oneida Alar again today as per PCP recommendations on last visit.   -checked A1C today

## 2014-06-23 NOTE — Assessment & Plan Note (Signed)
B/l knee xrays from May obtained today--Tricompartment degenerative changes most notable medial tibiofemoral joint space and patellofemoral joint space. Trace joint effusion may be present. She is requesting refill of Norco that was given twice by ED in the past, however due to multiple violations as shown above and FYI, OPC cannot do so today. She has been referred to pain clinic in the past but unfortunately has now showed up to appointments 2-4 times but she does not recall missing any more appointments after the first.   Given her degenerative changes on xray with possible effusion and considerable pain, I think she may benefit from sports medicine referral at this time. Alisha [name] agrees and says she had success with them in the past and with injections if needed. I have stressed the importance of keeping her appointments and have also provided her with the number to contact new patient coordinator for pain clinic per the referral note today.  Hopefully, she will follow through with those two appointments and make the calls.  -I also agree with follow up with Dr. Oneida Alar and have again recommended for her to call his office for follow up appointment.  -she would like to continue with naproxen at this time, which I think is reasonable given her pain complaints and improvement in the past. However, with coumadin, I have counseled her on possible increase risk of GIB which she understands. If she notices any blood in urine, stool, or emesis, she is to call right away and stop use of these medications. She voices understanding of these instructions today.  -follow up with new pcp on availability

## 2014-06-23 NOTE — Patient Instructions (Addendum)
General Instructions:  Dear Ms. Biederman,  We will try to set you up with sports medicine for follow up in regards to your knee pain,  Please call [phone] for patient coordinator in regards to your pain clinic appointment  You may take naproxen, however, please understand that it may increase your risk of bleeding with coumadin. Therefore, tylenol is better for your pain. If you notice any blood in urine, stool call us right away! [phone]  PLEASE STOP SMOKING, you can try calling [phone] for assistance as well  Please follow up with your new doctor on next available within 2-3 months  Thank you for bringing your medicines today. This helps [name] keep you safe from mistakes.   Progress Toward Treatment Goals:  Treatment Goal 06/23/2014  Blood pressure at goal  Stop smoking smoking the same amount  Prevent falls -    Self Care Goals & Plans:  Self Care Goal 06/23/2014  Manage my medications bring my medications to every visit  Eat healthy foods -  Be physically active -  Stop smoking -    No flowsheet data found.   Care Management & Community Referrals:  Referral 10/22/2013  Referrals made for care management support none needed

## 2014-06-23 NOTE — Assessment & Plan Note (Signed)
  Assessment: Progress toward smoking cessation:  smoking the same amount Barriers to progress toward smoking cessation:    Comments: up to 1/2ppd, aide says as many as she can get her hands on  Plan: Instruction/counseling given:  I counseled patient on the dangers of tobacco use, advised patient to stop smoking, and reviewed strategies to maximize success. Educational resources provided:    Self management tools provided:    Medications to assist with smoking cessation:  none Patient agreed to the following self-care plans for smoking cessation:    Other plans: refuses to quit at this time. Cessation strongly encouraged.

## 2014-06-23 NOTE — Assessment & Plan Note (Signed)
Please see discussion for OA

## 2014-06-24 NOTE — Addendum Note (Signed)
Addended by: Wilber Oliphant on: 06/24/2014 01:24 PM   Modules accepted: Orders

## 2014-07-04 ENCOUNTER — Ambulatory Visit (INDEPENDENT_AMBULATORY_CARE_PROVIDER_SITE_OTHER): Payer: Medicare Other | Admitting: Surgery

## 2014-07-04 ENCOUNTER — Ambulatory Visit (INDEPENDENT_AMBULATORY_CARE_PROVIDER_SITE_OTHER): Payer: Medicare Other | Admitting: Pharmacist

## 2014-07-04 DIAGNOSIS — IMO0002 Reserved for concepts with insufficient information to code with codable children: Secondary | ICD-10-CM | POA: Diagnosis not present

## 2014-07-04 DIAGNOSIS — Z7901 Long term (current) use of anticoagulants: Secondary | ICD-10-CM | POA: Diagnosis not present

## 2014-07-04 DIAGNOSIS — I82409 Acute embolism and thrombosis of unspecified deep veins of unspecified lower extremity: Secondary | ICD-10-CM | POA: Diagnosis not present

## 2014-07-04 DIAGNOSIS — F17200 Nicotine dependence, unspecified, uncomplicated: Secondary | ICD-10-CM | POA: Diagnosis not present

## 2014-07-04 DIAGNOSIS — I10 Essential (primary) hypertension: Secondary | ICD-10-CM | POA: Diagnosis not present

## 2014-07-04 DIAGNOSIS — I739 Peripheral vascular disease, unspecified: Secondary | ICD-10-CM | POA: Diagnosis not present

## 2014-07-04 LAB — POCT INR: INR: 2.1

## 2014-07-04 NOTE — Progress Notes (Signed)
Anti-Coagulation Progress Note  Alisha [name] is a 53 y.o. female who is currently on an anti-coagulation regimen.    RECENT RESULTS: Recent results are below, the most recent result is correlated with a dose of 71.25 mg. per week: Lab Results  Component Value Date   INR 2.10 07/04/2014   INR 1.60 06/13/2014   INR 3.28* 06/06/2014    ANTI-COAG DOSE: Anticoagulation Dose Instructions as of 07/04/2014     Sun Mon Tue Wed Thu Fri Sat   New Dose 11.25 mg 7.5 mg 11.25 mg 7.5 mg 11.25 mg 7.5 mg 15 mg       ANTICOAG SUMMARY: Anticoagulation Episode Summary   Current INR goal 2.0-3.0  Next INR check 07/25/2014  INR from last check 2.10 (07/04/2014)  Weekly max dose   Target end date   [facility] check [facility]  Preferred lab   Send INR reminders to ANTICOAG IMP   Indications  DVT [453.40] Long term current use of anticoagulant [V58.61]        Comments       Anticoagulation Care Providers   Provider Role Specialty Phone number   Burman Freestone, MD  Internal Medicine [phone]      ANTICOAG TODAY: Anticoagulation Summary as of 07/04/2014   INR goal 2.0-3.0  Selected INR 2.10 (07/04/2014)  Next INR check 07/25/2014  Target end date    Indications  DVT [453.40] Long term current use of anticoagulant [V58.61]      Anticoagulation Episode Summary   [facility] check [facility]   Preferred lab    Send INR reminders to ANTICOAG IMP   Comments     Anticoagulation Care Providers   Provider Role Specialty Phone number   Burman Freestone, MD  Internal Medicine [phone]      PATIENT INSTRUCTIONS: Patient Instructions  Patient instructed to take medications as defined in the Anti-coagulation Track section of this encounter.  Patient instructed to take today's dose.  Patient verbalized understanding of these instructions.       FOLLOW-UP Return in 3 weeks (on 07/25/2014) for Follow up INR at 1130h.  Jorene Guest, III Pharm.D., CACP

## 2014-07-04 NOTE — Patient Instructions (Signed)
Patient instructed to take medications as defined in the Anti-coagulation Track section of this encounter.  Patient instructed to take today's dose.  Patient verbalized understanding of these instructions.    

## 2014-07-07 NOTE — Progress Notes (Signed)
Alisha [name] is on coumadin for DVT.  INR at goal.  I reviewed Dr. Gladstone Pih note.

## 2014-07-08 ENCOUNTER — Ambulatory Visit (INDEPENDENT_AMBULATORY_CARE_PROVIDER_SITE_OTHER): Payer: Medicare Other | Admitting: Family Medicine

## 2014-07-08 ENCOUNTER — Encounter (INDEPENDENT_AMBULATORY_CARE_PROVIDER_SITE_OTHER): Payer: Self-pay | Admitting: Surgery

## 2014-07-08 ENCOUNTER — Ambulatory Visit (INDEPENDENT_AMBULATORY_CARE_PROVIDER_SITE_OTHER): Payer: Medicare Other | Admitting: Surgery

## 2014-07-08 ENCOUNTER — Encounter: Payer: Self-pay | Admitting: Family Medicine

## 2014-07-08 VITALS — BP 118/82 | HR 70 | Resp 14 | Ht 64.0 in | Wt 203.8 lb

## 2014-07-08 VITALS — BP 134/87 | HR 73 | Ht 64.0 in | Wt 203.0 lb

## 2014-07-08 DIAGNOSIS — Z9889 Other specified postprocedural states: Secondary | ICD-10-CM

## 2014-07-08 DIAGNOSIS — M1710 Unilateral primary osteoarthritis, unspecified knee: Secondary | ICD-10-CM

## 2014-07-08 DIAGNOSIS — M170 Bilateral primary osteoarthritis of knee: Secondary | ICD-10-CM

## 2014-07-08 NOTE — Progress Notes (Signed)
Pt returns 2 weeks after bilateral seed localized lumpectomy.  She is doing well.   Exam:  Incisions C/D/I without signs of infection.     PATH: 1. Breast, lumpectomy, Left - BENIGN BREAST TISSUE, SEE COMMENT - NEGATIVE FOR ATYPIA OR MALIGNANCY. - MICROCALCIFICATIONS IDENTIFIED. - SURGICAL MARGINS, NEGATIVE FOR ATYPIA OR MALIGNANCY. 2. Breast, lumpectomy, Right - BENIGN HYALINIZED AND CALCIFIED FIBROADENOMA (1.7 CM). SEE COMMENT. \      A/P; S/P BILATERAL SEED LOCALIZED LUMPECTOMY  RTC PRN YEARLY MAMMOGRAM NO RESTRICTIONS

## 2014-07-08 NOTE — Progress Notes (Signed)
Patient ID: Alisha [name], female   DOB: 09/09/1961, 53 y.o.   MRN: [id number]  Alisha [name] - 53 y.o. female MRN [id number]  Date of birth: January 24, 1961    SUBJECTIVE:     Bilateral knee pain. She reports history of bilateral severe knee arthritiscorticosteroid injections. She cannot tell me how long ago she had the last one or or 2 gave her the injection; she thinks it's at least greater than 3 months.  ROS:     No unusual weight change. Has not noted any redness warmth or swelling of the knees. No fever, sweats, chills.  PERTINEN T  PMH / PSH FH / / SH:  Past Medical, Surgical, Social, and Family History Reviewed & Updated in the EMR.  Pertinent findings include:  Chronic pain syndrome ASCVD with intermittent claudication of extremities Long-term use of anticoagulants Peripheral vascular disease Restless leg syndrome  OBJECTIVE: BP 134/87  Pulse 73  Ht 5\' 4"  (1.626 m)  Wt 203 lb (92.08 kg)  BMI 34.83 kg/m2  Physical Exam:  Vital signs are reviewed. GENERAL: Well-developed female no acute distress KNEES: Full extension flexion. Moderate crepitus virtually in the left knee on extension. Ligaments intact to varus and valgus stress. Normal Lock[place]. Popliteal space is benign. Calf is soft. Distally neurovascularly intact. Imaging: Tricompartmental arthritis bilateral knees.  ASSESSMENT & PLAN:  See problem based charting & AVS for pt instructions.

## 2014-07-12 NOTE — Assessment & Plan Note (Signed)
I reviewed her x-rays. They are not standing x-rays but they do show significant tricompartmental osteoarthritis, probably a little worse on the left knee. After discussion with her and her companion, she gets less than 2 weeks and sometimes less than one week of relief from corticosteroid injections. I spent greater than 50% of our 30 minute office visit in counseling and education regarding use of corticosteroid injections. The fact that she's getting only one to 2 weeks of relief, I do not think there risk is worth the benefit and I explained this in detail to her and  her companion. I do not have anything additional to offer her. We do not prescribe chronic pain medicine. We do not do surgery. I would refer her back to her PCP. At some point she may want to pursue orthopedic evaluation for knee replacement although she is only 80 and she has quite a complicated medical history.

## 2014-07-13 ENCOUNTER — Encounter (HOSPITAL_COMMUNITY): Payer: Self-pay | Admitting: Emergency Medicine

## 2014-07-13 ENCOUNTER — Emergency Department (HOSPITAL_COMMUNITY)
Admission: EM | Admit: 2014-07-13 | Discharge: 2014-07-13 | Disposition: A | Discharge status: Home / Self Care | Payer: Medicare Other | Attending: Emergency Medicine | Admitting: Emergency Medicine

## 2014-07-13 DIAGNOSIS — Z86718 Personal history of other venous thrombosis and embolism: Secondary | ICD-10-CM | POA: Diagnosis not present

## 2014-07-13 DIAGNOSIS — Z7901 Long term (current) use of anticoagulants: Secondary | ICD-10-CM | POA: Insufficient documentation

## 2014-07-13 DIAGNOSIS — G2581 Restless legs syndrome: Secondary | ICD-10-CM | POA: Diagnosis not present

## 2014-07-13 DIAGNOSIS — J45909 Unspecified asthma, uncomplicated: Secondary | ICD-10-CM | POA: Insufficient documentation

## 2014-07-13 DIAGNOSIS — S8990XA Unspecified injury of unspecified lower leg, initial encounter: Secondary | ICD-10-CM | POA: Diagnosis not present

## 2014-07-13 DIAGNOSIS — Z7982 Long term (current) use of aspirin: Secondary | ICD-10-CM | POA: Diagnosis not present

## 2014-07-13 DIAGNOSIS — F17200 Nicotine dependence, unspecified, uncomplicated: Secondary | ICD-10-CM | POA: Diagnosis not present

## 2014-07-13 DIAGNOSIS — I10 Essential (primary) hypertension: Secondary | ICD-10-CM | POA: Diagnosis not present

## 2014-07-13 DIAGNOSIS — Z9889 Other specified postprocedural states: Secondary | ICD-10-CM | POA: Insufficient documentation

## 2014-07-13 DIAGNOSIS — M25569 Pain in unspecified knee: Secondary | ICD-10-CM | POA: Insufficient documentation

## 2014-07-13 DIAGNOSIS — M25562 Pain in left knee: Secondary | ICD-10-CM

## 2014-07-13 DIAGNOSIS — Z8742 Personal history of other diseases of the female genital tract: Secondary | ICD-10-CM | POA: Diagnosis not present

## 2014-07-13 DIAGNOSIS — E785 Hyperlipidemia, unspecified: Secondary | ICD-10-CM | POA: Diagnosis not present

## 2014-07-13 DIAGNOSIS — Z79899 Other long term (current) drug therapy: Secondary | ICD-10-CM | POA: Diagnosis not present

## 2014-07-13 DIAGNOSIS — F3289 Other specified depressive episodes: Secondary | ICD-10-CM | POA: Diagnosis not present

## 2014-07-13 DIAGNOSIS — S99919A Unspecified injury of unspecified ankle, initial encounter: Secondary | ICD-10-CM | POA: Diagnosis not present

## 2014-07-13 DIAGNOSIS — M1710 Unilateral primary osteoarthritis, unspecified knee: Secondary | ICD-10-CM | POA: Diagnosis not present

## 2014-07-13 DIAGNOSIS — M25561 Pain in right knee: Secondary | ICD-10-CM

## 2014-07-13 DIAGNOSIS — IMO0002 Reserved for concepts with insufficient information to code with codable children: Secondary | ICD-10-CM | POA: Diagnosis not present

## 2014-07-13 DIAGNOSIS — F411 Generalized anxiety disorder: Secondary | ICD-10-CM | POA: Diagnosis not present

## 2014-07-13 DIAGNOSIS — G8929 Other chronic pain: Secondary | ICD-10-CM | POA: Diagnosis not present

## 2014-07-13 DIAGNOSIS — F329 Major depressive disorder, single episode, unspecified: Secondary | ICD-10-CM | POA: Insufficient documentation

## 2014-07-13 MED ORDER — KETOROLAC TROMETHAMINE 60 MG/2ML IM SOLN
60.0000 mg | Freq: Once | INTRAMUSCULAR | Status: AC
  Administered 2014-07-13: 60 mg via INTRAMUSCULAR
  Filled 2014-07-13: qty 2

## 2014-07-13 NOTE — Discharge Instructions (Signed)

## 2014-07-13 NOTE — ED Notes (Signed)
Dr. Yelverton at the bedside.  

## 2014-07-13 NOTE — ED Notes (Signed)
Per EMS, the patient is having bilateral knee pain. History of arthritis, she received shots on Monday.  Poor circulation in lower extremities. Currently try to be seen at a pain clinic.  VS: BP 160/90, P 80, O2 sat 97% on room air.

## 2014-07-13 NOTE — ED Provider Notes (Signed)
CSN: [id number]     Arrival date & time 07/13/14  0341 History   First MD Initiated Contact with Patient 07/13/14 0358     Chief Complaint  Patient presents with  . Knee Pain     (Consider location/radiation/quality/duration/timing/severity/associated sxs/prior Treatment) HPI Patient has chronic bilateral knee pain related to her arthritis. She presents for pain medication. She states that she fell onto her knees from standing this evening and she thinks that is worsened in pain. There is no evidence of any trauma. She has no new numbness or weakness. There is no new swelling. She's been referred by her primary MD several times to pain management but has missed her appointments. Past Medical History  Diagnosis Date  . DVT (deep venous thrombosis)     bilateral, 2 episodes: Requires lifelong therapy  . Chronic pain syndrome   . Restless leg syndrome   . Depression   . Hyperlipidemia   . Hypertension   . PVD (peripheral vascular disease)     s/p L fem-pop bypass 11/21/08; graft occluded 12/28/08;  aortogram w/ bilat LE runoff: 1.  bilat diffuse SFA occlusive dz, 2.  Mod to severe above-knee popliteal dz,  3.  Bilat 3-vessel runoff w/ mild tibial occlusive dz.  . Tobacco abuse   . Degenerative joint disease of knee   . Breast lump 02/2008    Biopsy 05/2008: showed no evidence of malignancy  . Irregular menses 9/08  . Pulmonary edema   . Anxiety   . Joint pain   . Mixed stress and urge urinary incontinence     Being followed by [facility], underwent cystoscopy & uroflowmetry   . Endometrial mass 10/12/2012    Endometrium, biopsy on 11/04/12 - PROLIFERATIVE ENDOMETRIUM AND ABUNDANT MUCUS. NO HYPERPLASIA OR CARCINOMA.   . Sigmoid diverticulitis 10/26/2012  . Asthma   . Breast mass in female     bi lat    Past Surgical History  Procedure Laterality Date  . Femoral bypass  11/09  .  right external iliac artery stent    . Right breast needle-localized lumpectomy    . Pr vein  bypass graft,aorto-fem-pop    . Carotid endarterectomy    . Tubal ligation    . Tonsillectomy    . Cardiac catheterization  2/15  . Breast lumpectomy with radioactive seed localization Bilateral 05/03/2014    Procedure: BREAST LUMPECTOMY WITH RADIOACTIVE SEED LOCALIZATION;  Surgeon: Joyice Faster. Cornett, MD;  Location: [place];  Service: General;  Laterality: Bilateral;   Family History  Problem Relation Age of Onset  . Breast cancer Mother   . Colon cancer Neg Hx   . Rectal cancer Neg Hx   . Stomach cancer Neg Hx   . Breast cancer Other     GRE[employer] AUNT   History  Substance Use Topics  . Smoking status: Current Every Day Smoker -- 0.25 packs/day for 18 years    Types: Cigarettes  . Smokeless tobacco: Never Used     Comment: 3-6 per day  . Alcohol Use: Yes     Comment: rare   OB History   Grav Para Term Preterm Abortions TAB SAB Ect Mult Living   6 4 4  2 1 1   4      Review of Systems  Constitutional: Negative for fever and chills.  Musculoskeletal: Positive for arthralgias. Negative for joint swelling, myalgias, neck pain and neck stiffness.  Skin: Negative for rash and wound.  Neurological: Negative for weakness and  numbness.  All other systems reviewed and are negative.     Allergies  Propoxyphene n-acetaminophen  Home Medications   Prior to Admission medications   Medication Sig Start Date End Date Taking? Authorizing Provider  albuterol (PROVENTIL HFA;VENTOLIN HFA) 108 (90 BASE) MCG/ACT inhaler Inhale 1 puff into the lungs every 4 (four) hours as needed for wheezing or shortness of breath.    Historical Provider, MD  aspirin 81 MG tablet Take 1 tablet (81 mg total) by mouth daily. 10/01/11   Burman Freestone, MD  atorvastatin (LIPITOR) 40 MG tablet Take 1 tablet (40 mg total) by mouth at bedtime. 11/05/13   Ejiroghene Emokpae, MD  buPROPion (WELLBUTRIN XL) 150 MG 24 hr tablet Take 150 mg by mouth daily.    Historical Provider, MD  carvedilol (COREG)  25 MG tablet Take 1 tablet (25 mg total) by mouth 2 (two) times daily with a meal. 02/28/14   Karren Cobble, MD  diazepam (VALIUM) 5 MG tablet Take 10 mg by mouth 3 (three) times daily.     Historical Provider, MD  DULoxetine (CYMBALTA) 60 MG capsule Take 60 mg by mouth 2 (two) times daily.    Historical Provider, MD  gabapentin (NEURONTIN) 800 MG tablet Take 800-1,600 mg by mouth See admin instructions. Take 1 tablet in the morning, 1 tablet midday and 2 tablets at bedtime    Historical Provider, MD  hydrOXYzine (ATARAX/VISTARIL) 25 MG tablet Take 25 mg by mouth 3 (three) times daily as needed for itching.     Historical Provider, MD  lisinopril-hydrochlorothiazide (PRINZIDE,ZESTORETIC) 20-12.5 MG per tablet Take 2 tablets by mouth daily.    Historical Provider, MD  naproxen (NAPROSYN) 500 MG tablet Take 1 tablet (500 mg total) by mouth 2 (two) times daily with a meal. 06/23/14 06/23/15  Jerene Pitch, MD  potassium chloride (KLOR-CON 10) 10 MEQ tablet Take 1 tablet (10 mEq total) by mouth daily. 06/23/14   Jerene Pitch, MD  traZODone (DESYREL) 100 MG tablet Take 300 mg by mouth at bedtime.     Clarene Reamer, MD  warfarin (COUMADIN) 7.5 MG tablet Take 1 tablet (7.5 mg total) by mouth daily at 6 PM. 05/05/14   Joyice Faster. Cornett, MD  ziprasidone (GEODON) 60 MG capsule Take 60 mg by mouth 2 (two) times daily with a meal.     Historical Provider, MD   BP 120/76  Pulse 79  Temp(Src) 97.6 F (36.4 C) (Oral)  Resp 12  SpO2 96% Physical Exam  Nursing note and vitals reviewed. Constitutional: She is oriented to person, place, and time. She appears well-developed and well-nourished. No distress.  HENT:  Head: Normocephalic and atraumatic.  Mouth/Throat: Oropharynx is clear and moist.  Eyes: EOM are normal. Pupils are equal, round, and reactive to light.  Neck: Normal range of motion. Neck supple.  No posterior midline cervical tenderness to palpation.  Cardiovascular: Normal rate and regular  rhythm.   Pulmonary/Chest: Effort normal and breath sounds normal. No respiratory distress. She has no wheezes. She has no rales.  Abdominal: Soft. Bowel sounds are normal.  Musculoskeletal: She exhibits tenderness. She exhibits no edema.  Patient with decreased range of motion to bilateral knees due to pain. There is no evidence of trauma. There is no swelling. Joint is diffusely tender bilaterally. Patient has distal pulses and warm extremities. She has no calf tenderness or swelling.  Neurological: She is alert and oriented to person, place, and time.  Patient is ambulatory. Sensation is intact.  Skin: Skin is warm and dry. No rash noted. No erythema.  Psychiatric: She has a normal mood and affect. Her behavior is normal.    ED Course  Procedures (including critical care time) Labs Review Labs Reviewed - No data to display  Imaging Review No results found.   EKG Interpretation None      MDM   Final diagnoses:  None    Patient was seen by her sports medicine doctor yesterday and had x-rays performed. She was told she would need to be referred to a pain management specialist and that they did not give chronic pain medication. I reiterated to the patient that she needs to followup with her pain management appointment. I see no reason to perform any imaging at this time. Return precautions given.    Julianne Rice, MD 07/13/14 [phone]

## 2014-07-25 ENCOUNTER — Ambulatory Visit (INDEPENDENT_AMBULATORY_CARE_PROVIDER_SITE_OTHER): Payer: Medicare Other | Admitting: Pharmacist

## 2014-07-25 DIAGNOSIS — F17200 Nicotine dependence, unspecified, uncomplicated: Secondary | ICD-10-CM | POA: Diagnosis not present

## 2014-07-25 DIAGNOSIS — Z7901 Long term (current) use of anticoagulants: Secondary | ICD-10-CM

## 2014-07-25 DIAGNOSIS — I82409 Acute embolism and thrombosis of unspecified deep veins of unspecified lower extremity: Secondary | ICD-10-CM | POA: Diagnosis not present

## 2014-07-25 DIAGNOSIS — I739 Peripheral vascular disease, unspecified: Secondary | ICD-10-CM | POA: Diagnosis not present

## 2014-07-25 DIAGNOSIS — I10 Essential (primary) hypertension: Secondary | ICD-10-CM | POA: Diagnosis not present

## 2014-07-25 DIAGNOSIS — IMO0002 Reserved for concepts with insufficient information to code with codable children: Secondary | ICD-10-CM | POA: Diagnosis not present

## 2014-07-25 LAB — POCT INR: INR: 2.5

## 2014-07-25 NOTE — Patient Instructions (Signed)
Patient instructed to take medications as defined in the Anti-coagulation Track section of this encounter.  Patient instructed to take today's dose.  Patient verbalized understanding of these instructions.    

## 2014-07-25 NOTE — Progress Notes (Signed)
Anti-Coagulation Progress Note  Alisha [name] is a 53 y.o. female who is currently on an anti-coagulation regimen.    RECENT RESULTS: Recent results are below, the most recent result is correlated with a dose of 71.25 mg. per week: Lab Results  Component Value Date   INR 2.5 07/25/2014   INR 2.10 07/04/2014   INR 1.60 06/13/2014    ANTI-COAG DOSE: Anticoagulation Dose Instructions as of 07/25/2014     Dorene Grebe Tue Wed Thu Fri Sat   New Dose 11.25 mg 7.5 mg 11.25 mg 7.5 mg 11.25 mg 7.5 mg 15 mg       ANTICOAG SUMMARY: Anticoagulation Episode Summary   Current INR goal 2.0-3.0  Next INR check 08/22/2014  INR from last check 2.5 (07/25/2014)  Weekly max dose   Target end date   [facility]  Preferred lab   Send INR reminders to [facility]TICOAG [facility]   Indications  DVT [453.40] Long term current use of anticoagulant [V58.61]        Comments       Anticoagulation Care Providers   Provider Role Specialty Phone number   Burman Freestone, MD  Internal Medicine [phone]      ANTICOAG TODAY: Anticoagulation Summary as of 07/25/2014   INR goal 2.0-3.0  Selected INR 2.5 (07/25/2014)  Next INR check 08/22/2014  Target end date    Indications  DVT [453.40] Long term current use of anticoagulant [V58.61]      Anticoagulation Episode Summary   [facility] check [facility]   Preferred lab    Send INR reminders to ANTICOAG [facility]   Comments     Anticoagulation Care Providers   Provider Role Specialty Phone number   Burman Freestone, MD  Internal Medicine [phone]      PATIENT INSTRUCTIONS: Patient Instructions  Patient instructed to take medications as defined in the Anti-coagulation Track section of this encounter.  Patient instructed to take today's dose.  Patient verbalized understanding of these instructions.       FOLLOW-UP Return in 4 weeks (on 08/22/2014) for Follow up INR at 1130h.  Jorene Guest, III Pharm.D., CACP

## 2014-07-26 ENCOUNTER — Other Ambulatory Visit: Payer: Self-pay | Admitting: Cardiology

## 2014-08-09 DIAGNOSIS — F333 Major depressive disorder, recurrent, severe with psychotic symptoms: Secondary | ICD-10-CM | POA: Diagnosis not present

## 2014-08-11 ENCOUNTER — Encounter: Payer: Self-pay | Admitting: Internal Medicine

## 2014-08-11 ENCOUNTER — Ambulatory Visit (INDEPENDENT_AMBULATORY_CARE_PROVIDER_SITE_OTHER): Payer: Medicare Other | Admitting: Internal Medicine

## 2014-08-11 ENCOUNTER — Other Ambulatory Visit: Payer: Self-pay | Admitting: *Deleted

## 2014-08-11 VITALS — BP 131/90 | HR 71 | Temp 97.0°F | Wt 204.0 lb

## 2014-08-11 DIAGNOSIS — I10 Essential (primary) hypertension: Secondary | ICD-10-CM

## 2014-08-11 DIAGNOSIS — I82409 Acute embolism and thrombosis of unspecified deep veins of unspecified lower extremity: Secondary | ICD-10-CM | POA: Diagnosis not present

## 2014-08-11 DIAGNOSIS — I739 Peripheral vascular disease, unspecified: Secondary | ICD-10-CM | POA: Diagnosis not present

## 2014-08-11 DIAGNOSIS — Z7901 Long term (current) use of anticoagulants: Secondary | ICD-10-CM | POA: Diagnosis not present

## 2014-08-11 DIAGNOSIS — IMO0002 Reserved for concepts with insufficient information to code with codable children: Secondary | ICD-10-CM | POA: Diagnosis not present

## 2014-08-11 DIAGNOSIS — F17200 Nicotine dependence, unspecified, uncomplicated: Secondary | ICD-10-CM | POA: Diagnosis not present

## 2014-08-11 DIAGNOSIS — N644 Mastodynia: Secondary | ICD-10-CM | POA: Diagnosis not present

## 2014-08-11 MED ORDER — HYDROCHLOROTHIAZIDE 12.5 MG PO TABS: 12.5000 mg | ORAL_TABLET | Freq: Every day | ORAL | Status: DC

## 2014-08-11 MED ORDER — LISINOPRIL 20 MG PO TABS: 20.0000 mg | ORAL_TABLET | Freq: Two times a day (BID) | ORAL | Status: DC

## 2014-08-11 NOTE — Progress Notes (Signed)
Subjective:     Patient ID: Alisha [name], female   DOB: [birth year], 53 y.o.   MRN: [id number]  HPI Pt is a 53 y/o female w/ PMH of HTN, b/l lumpectomy 5/5, PVD, depression, and DVTs on chronic coumadin who presents for HTN medication adjustment and left breast pain. Pt is also requesting a power wheel chair. During exam pt was tearful and frustrated about not getting a power wheel chair. She complains that her lt lower extremity bypass graft for PVD in 2009 was botched and that no one can help her. She has a wheel chair at home but is requesting a motorized wheelchair /scooter to help her get around. She has a nurse aid 7 days a week and he was present during exam today. Pt goes to [facility] for her depression and was recently taken off trazodone by a physician there who told her it can cause internal bleeding in combination with her chronic coumadin. She was instructed to stop it b/c she was having easy bruising and noticed bright red blood on her toilet paper. Pt currently denies any BRBPR or blood in stools.    HTN: Pt is on coreg 25mg  BID, and HCTZ/lisinopril combo BID. She is requesting for HCTZ/lisinopril combination to be separated b/c she does not like taking HCTZ at night due to frequent urination side effect. She has been on norvasc 10mg  in the past but was taken off due to low blood pressures. Today in clinic her blood pressure is 131/90  Lt breat pain: pt recently had b/l breast lumpectomy by Dr. Brantley Stage on 5/5 w/ pathology negative for malignancy. Recommended for yearly mammograms. Today pt feels like lt breast is infected. She is reporting chills - feeling hot and cold, and a foul smell associated w/ left breast. Pt denies any fevers. Pt says it hurts to move her breast or take it out of her bra. Pain began after her sx on 5/5. Pt allowed this writer to perform breast exam but would not allow the attending to exam breast stating "im not gonna let you punch on my breast if you're not gonna give me  pain meds."    Review of Systems  Constitutional: Positive for chills. Negative for fever.  Respiratory: Positive for shortness of breath (resolves w/ albuterol inhaler).   Cardiovascular: Negative for chest pain.  Gastrointestinal: Negative for nausea, vomiting and blood in stool.  Musculoskeletal:       Lt breast pain   Hematological: Bruises/bleeds easily.       Objective:   Physical Exam  Constitutional: She appears well-developed and well-nourished.  Eyes: Pupils are equal, round, and reactive to light.  Cardiovascular: Normal rate and regular rhythm.   Pulmonary/Chest: Effort normal and breath sounds normal. Right breast exhibits no mass and no tenderness. Left breast exhibits tenderness (at upper medial quadrant). Left breast exhibits no mass.  No left axillary lymph nodes palpated   Abdominal: Soft. Bowel sounds are normal.  Musculoskeletal:  Knees b/l with no swelling, non tender to palpation, able to stand on her own  Lymphadenopathy:    She has no cervical adenopathy.  Skin: Skin is warm and dry.  Psychiatric:  Tearful and angry during exam        Assessment:     Please see problem based assessment and plan.     Plan:     Please see problem based assessment and plan.

## 2014-08-11 NOTE — Patient Instructions (Signed)
Please start taking lisinopril 20mg  twice a day and hydrochlorothiazide 12.5 mg once a day.  Also, please take diclofenac sparingly as we discussed today it can cause side effects if used in combination with coumadin.   We will get a BMET and INR check next week when you see Dr. Elie Confer  General Instructions:   Thank you for bringing your medicines today. This helps [name] keep you safe from mistakes.   Progress Toward Treatment Goals:  Treatment Goal 06/23/2014  Blood pressure at goal  Stop smoking smoking the same amount  Prevent falls -    Self Care Goals & Plans:  Self Care Goal 08/11/2014  Manage my medications take my medicines as prescribed  Eat healthy foods eat foods that are low in salt; eat baked foods instead of fried foods  Be physically active find an activity I enjoy  Stop smoking cut down the number of cigarettes smoked; go to the [employer] (https://[employer]  Prevent falls use home fall prevention checklist to improve safety

## 2014-08-11 NOTE — Assessment & Plan Note (Addendum)
BP Readings from Last 3 Encounters:  08/11/14 131/90  07/13/14 110/57  07/08/14 134/87    Lab Results  Component Value Date   NA 145 06/06/2014   K 3.5* 06/06/2014   CREATININE 0.95 06/06/2014    Assessment: Blood pressure control:  controlled Progress toward BP goal:   at goal Comments: pt on coreg 25mg  BID and HCTZ/lisinopril BID, pt does not want to be on HCTZ at night due to frequent urination associated with it.   Plan: Medications:  continue coreg, given rx for lisinopril 20mg  BID and HCTZ 12.5 mg once a day Educational resources provided: brochure Self management tools provided:   Other plans: will f/u in 1 month for BP check along with power wheel chair assessment, will also get BMET at next week when she sees Dr. Elie Confer for INR check.

## 2014-08-11 NOTE — Assessment & Plan Note (Addendum)
Pt reports left breast pain in clinic today. She says it started after her lumpectomy procedure w/ Dr. Brantley Stage in 5/5. On physical exam while seated, pt was tender to palpation of upper breast. While laying down on exam bed for breast exam pt was non tender to palpation of left breast while crying about power wheel chair. When asked why she wasn't tender this time she said she was more upset over wheel chair. On resuming breast left breast exam pt was then tender to palpation of breast. No masses or lumps were palpated. There was no odor associated with left breast. No axillary lymphadenopathy. Skin was non erythematous b/l over breast. She would not like the attending exam her breast if she was not going to be prescribed pain mesd.   - likely pain is related to recent surgery in 5/5. Recommended patient to try tylenol for pain. Pt understands that our clinic will not prescribe her pain medications. Ulis Rias made a pain clinic referral today. Of note, pt has been rejected from pain clinics in the past. She was also instructed to take diclofenac with caution as it could cause bleeding in conjunction with coumadin. Pt understands.   - she was also informed that she should see Dr. Brantley Stage who did the breast lumpectomy if her breast pain is severe. She was upset by this is stated that she would need pain meds in the meantime until she saw Dr. Brantley Stage. If pain worsens will refer to Dr. Brantley Stage.

## 2014-08-11 NOTE — Progress Notes (Signed)
Patient ID: Alisha [name], female   DOB: [birth year], 53 y.o.   MRN: [id number]   Met with patient with Dr Hulen Luster. The patient is angry about not getting her pain meds. I advised the patient that the clinic cannot give her any more opiates, she should take any NSAIDs with great caution, better not take them and use only Tylenol, as she takes both coumadin and aspirin. The patient has a previous bottle of diclofenac, and she had been using it PRN. Now advised, she agrees not to use it.   The patient also wants a power wheelchair and I have discussed this with [facility] and Ulis Rias. They will proceed to do the required with [facility] and set up a visit for the same.   The breast pain that the patient was reporting - she did not let me examine her breast because I refused her opiate medications.   I saw and evaluated the patient.  I personally confirmed the key portions of the history and exam documented by Dr. Hulen Luster and I reviewed pertinent patient test results.  The assessment, diagnosis, and plan were formulated together and I agree with the documentation in the resident's note.

## 2014-08-11 NOTE — Assessment & Plan Note (Addendum)
Pt requesting a power wheel chair. She states that it is difficult to get around 2/2 her PVD. She is tearful and angry about not getting one, although no one told her that she would not get one. She currently has a regular manual wheelchair but still has difficulty getting around.    -She was assured that our clinic will try to help her get this. Will schedule her an appt along with [place] present so that we can start the paper work for pt to get power wheel chair.

## 2014-08-12 NOTE — Progress Notes (Signed)
Reviewed note by Dr Hulen Luster. In addition to arranging rehabilitation for the patient for her PAD, we can also try getting her to go back to the vascular surgeons she was with.

## 2014-08-22 ENCOUNTER — Ambulatory Visit (INDEPENDENT_AMBULATORY_CARE_PROVIDER_SITE_OTHER): Payer: Medicare Other | Admitting: Pharmacist

## 2014-08-22 ENCOUNTER — Ambulatory Visit: Payer: Medicare Other | Admitting: Internal Medicine

## 2014-08-22 DIAGNOSIS — Z7901 Long term (current) use of anticoagulants: Secondary | ICD-10-CM

## 2014-08-22 DIAGNOSIS — I82409 Acute embolism and thrombosis of unspecified deep veins of unspecified lower extremity: Secondary | ICD-10-CM

## 2014-08-22 DIAGNOSIS — N644 Mastodynia: Secondary | ICD-10-CM | POA: Diagnosis not present

## 2014-08-22 DIAGNOSIS — I739 Peripheral vascular disease, unspecified: Secondary | ICD-10-CM | POA: Diagnosis not present

## 2014-08-22 DIAGNOSIS — F17200 Nicotine dependence, unspecified, uncomplicated: Secondary | ICD-10-CM | POA: Diagnosis not present

## 2014-08-22 DIAGNOSIS — IMO0002 Reserved for concepts with insufficient information to code with codable children: Secondary | ICD-10-CM | POA: Diagnosis not present

## 2014-08-22 DIAGNOSIS — I10 Essential (primary) hypertension: Secondary | ICD-10-CM | POA: Diagnosis not present

## 2014-08-22 LAB — POCT INR: INR: 2.4

## 2014-08-22 NOTE — Progress Notes (Signed)
Anti-Coagulation Progress Note  Alisha [name] is a 53 y.o. female who is currently on an anti-coagulation regimen.    RECENT RESULTS: Recent results are below, the most recent result is correlated with a dose of 71.25 mg. per week: Lab Results  Component Value Date   INR 2.4 08/22/2014   INR 2.5 07/25/2014   INR 2.10 07/04/2014    ANTI-COAG DOSE: Anticoagulation Dose Instructions as of 08/22/2014     Dorene Grebe Tue Wed Thu Fri Sat   New Dose 11.25 mg 7.5 mg 11.25 mg 7.5 mg 11.25 mg 7.5 mg 15 mg       ANTICOAG SUMMARY: Anticoagulation Episode Summary   Current INR goal 2.0-3.0  Next INR check 09/12/2014  INR from last check 2.4 (08/22/2014)  Weekly max dose   Target end date   [facility]  Preferred lab   Send INR reminders to ANTICOAG [facility]   Indications  DVT [453.40] Long term current use of anticoagulant [V58.61]        Comments       Anticoagulation Care Providers   Provider Role Specialty Phone number   Burman Freestone, MD  Internal Medicine [phone]      ANTICOAG TODAY: Anticoagulation Summary as of 08/22/2014   INR goal 2.0-3.0  Selected INR 2.4 (08/22/2014)  Next INR check 09/12/2014  Target end date    Indications  DVT [453.40] Long term current use of anticoagulant [V58.61]      Anticoagulation Episode Summary   [facility] check [facility]   Preferred lab    Send INR reminders to ANTICOAG IMP   Comments     Anticoagulation Care Providers   Provider Role Specialty Phone number   Burman Freestone, MD  Internal Medicine [phone]      PATIENT INSTRUCTIONS: Patient Instructions  Patient instructed to take medications as defined in the Anti-coagulation Track section of this encounter.  Patient instructed to take today's dose.  Patient verbalized understanding of these instructions.       FOLLOW-UP Return in about 3 weeks (around 09/12/2014) for Follow up INR at 11:00am.  Jorene Guest, III Pharm.D., CACP

## 2014-08-22 NOTE — Patient Instructions (Signed)
Patient instructed to take medications as defined in the Anti-coagulation Track section of this encounter.  Patient instructed to take today's dose.  Patient verbalized understanding of these instructions.    

## 2014-08-27 ENCOUNTER — Other Ambulatory Visit: Payer: Self-pay | Admitting: Cardiology

## 2014-08-28 ENCOUNTER — Emergency Department (HOSPITAL_COMMUNITY): Payer: Medicare Other

## 2014-08-28 ENCOUNTER — Observation Stay (HOSPITAL_COMMUNITY)
Admission: EM | Admit: 2014-08-28 | Discharge: 2014-08-29 | Disposition: A | Discharge status: Home / Self Care | Payer: Medicare Other | Attending: Internal Medicine | Admitting: Internal Medicine

## 2014-08-28 ENCOUNTER — Encounter (HOSPITAL_COMMUNITY): Payer: Self-pay | Admitting: Emergency Medicine

## 2014-08-28 DIAGNOSIS — T502X5A Adverse effect of carbonic-anhydrase inhibitors, benzothiadiazides and other diuretics, initial encounter: Secondary | ICD-10-CM

## 2014-08-28 DIAGNOSIS — I10 Essential (primary) hypertension: Secondary | ICD-10-CM | POA: Diagnosis not present

## 2014-08-28 DIAGNOSIS — Z9889 Other specified postprocedural states: Secondary | ICD-10-CM | POA: Insufficient documentation

## 2014-08-28 DIAGNOSIS — Z7901 Long term (current) use of anticoagulants: Secondary | ICD-10-CM | POA: Insufficient documentation

## 2014-08-28 DIAGNOSIS — Z79899 Other long term (current) drug therapy: Secondary | ICD-10-CM | POA: Insufficient documentation

## 2014-08-28 DIAGNOSIS — E785 Hyperlipidemia, unspecified: Secondary | ICD-10-CM | POA: Insufficient documentation

## 2014-08-28 DIAGNOSIS — R110 Nausea: Secondary | ICD-10-CM | POA: Diagnosis not present

## 2014-08-28 DIAGNOSIS — R079 Chest pain, unspecified: Principal | ICD-10-CM | POA: Insufficient documentation

## 2014-08-28 DIAGNOSIS — E782 Mixed hyperlipidemia: Secondary | ICD-10-CM | POA: Diagnosis present

## 2014-08-28 DIAGNOSIS — Z7982 Long term (current) use of aspirin: Secondary | ICD-10-CM | POA: Insufficient documentation

## 2014-08-28 DIAGNOSIS — M1710 Unilateral primary osteoarthritis, unspecified knee: Secondary | ICD-10-CM | POA: Diagnosis not present

## 2014-08-28 DIAGNOSIS — Z8742 Personal history of other diseases of the female genital tract: Secondary | ICD-10-CM | POA: Insufficient documentation

## 2014-08-28 DIAGNOSIS — F329 Major depressive disorder, single episode, unspecified: Secondary | ICD-10-CM | POA: Insufficient documentation

## 2014-08-28 DIAGNOSIS — R0602 Shortness of breath: Secondary | ICD-10-CM | POA: Diagnosis not present

## 2014-08-28 DIAGNOSIS — R5383 Other fatigue: Secondary | ICD-10-CM | POA: Diagnosis not present

## 2014-08-28 DIAGNOSIS — F419 Anxiety disorder, unspecified: Secondary | ICD-10-CM | POA: Diagnosis present

## 2014-08-28 DIAGNOSIS — R5381 Other malaise: Secondary | ICD-10-CM | POA: Insufficient documentation

## 2014-08-28 DIAGNOSIS — F418 Other specified anxiety disorders: Secondary | ICD-10-CM | POA: Diagnosis present

## 2014-08-28 DIAGNOSIS — F411 Generalized anxiety disorder: Secondary | ICD-10-CM | POA: Diagnosis not present

## 2014-08-28 DIAGNOSIS — R42 Dizziness and giddiness: Secondary | ICD-10-CM | POA: Diagnosis not present

## 2014-08-28 DIAGNOSIS — F17200 Nicotine dependence, unspecified, uncomplicated: Secondary | ICD-10-CM | POA: Diagnosis not present

## 2014-08-28 DIAGNOSIS — I8290 Acute embolism and thrombosis of unspecified vein: Secondary | ICD-10-CM | POA: Diagnosis present

## 2014-08-28 DIAGNOSIS — Z86718 Personal history of other venous thrombosis and embolism: Secondary | ICD-10-CM | POA: Diagnosis not present

## 2014-08-28 DIAGNOSIS — I739 Peripheral vascular disease, unspecified: Secondary | ICD-10-CM | POA: Diagnosis present

## 2014-08-28 DIAGNOSIS — F3289 Other specified depressive episodes: Secondary | ICD-10-CM | POA: Diagnosis not present

## 2014-08-28 DIAGNOSIS — J811 Chronic pulmonary edema: Secondary | ICD-10-CM | POA: Insufficient documentation

## 2014-08-28 DIAGNOSIS — IMO0002 Reserved for concepts with insufficient information to code with codable children: Secondary | ICD-10-CM | POA: Insufficient documentation

## 2014-08-28 DIAGNOSIS — K5732 Diverticulitis of large intestine without perforation or abscess without bleeding: Secondary | ICD-10-CM | POA: Insufficient documentation

## 2014-08-28 DIAGNOSIS — R0789 Other chest pain: Secondary | ICD-10-CM | POA: Diagnosis not present

## 2014-08-28 DIAGNOSIS — I160 Hypertensive urgency: Secondary | ICD-10-CM | POA: Diagnosis present

## 2014-08-28 DIAGNOSIS — G2581 Restless legs syndrome: Secondary | ICD-10-CM | POA: Diagnosis not present

## 2014-08-28 DIAGNOSIS — J45909 Unspecified asthma, uncomplicated: Secondary | ICD-10-CM | POA: Diagnosis not present

## 2014-08-28 DIAGNOSIS — G894 Chronic pain syndrome: Secondary | ICD-10-CM | POA: Diagnosis not present

## 2014-08-28 DIAGNOSIS — E876 Hypokalemia: Secondary | ICD-10-CM

## 2014-08-28 LAB — I-STAT CHEM 8, ED
BUN: 9 mg/dL (ref 6–23)
CHLORIDE: 109 meq/L (ref 96–112)
Calcium, Ion: 1.12 mmol/L (ref 1.12–1.23)
Creatinine, Ser: 0.9 mg/dL (ref 0.50–1.10)
Glucose, Bld: 89 mg/dL (ref 70–99)
HEMATOCRIT: 48 % — AB (ref 36.0–46.0)
HEMOGLOBIN: 16.3 g/dL — AB (ref 12.0–15.0)
POTASSIUM: 3.4 meq/L — AB (ref 3.7–5.3)
SODIUM: 143 meq/L (ref 137–147)
TCO2: 22 mmol/L (ref 0–100)

## 2014-08-28 LAB — I-STAT TROPONIN, ED: Troponin i, poc: 0 ng/mL (ref 0.00–0.08)

## 2014-08-28 LAB — PROTIME-INR
INR: 2.87 — ABNORMAL HIGH (ref 0.00–1.49)
PROTHROMBIN TIME: 30.1 s — AB (ref 11.6–15.2)

## 2014-08-28 LAB — PRO B NATRIURETIC PEPTIDE: PRO B NATRI PEPTIDE: 111.3 pg/mL (ref 0–125)

## 2014-08-28 MED ORDER — CLONIDINE HCL 0.2 MG PO TABS
0.2000 mg | ORAL_TABLET | Freq: Once | ORAL | Status: AC
  Administered 2014-08-28: 0.2 mg via ORAL
  Filled 2014-08-28: qty 1

## 2014-08-28 NOTE — ED Provider Notes (Signed)
CSN: [id number]     Arrival date & time 08/28/14  1926 History   First MD Initiated Contact with Patient 08/28/14 2032     Chief Complaint  Patient presents with  . Chest Pain  . Shortness of Breath     HPI Patient arrives with complaint of shortness of breath accompanied by chest heaviness, lightheadedness, dizziness, weakness, nausea, and left arm paresthesia. States history of asthma and anxiety with exacerbation recently. Endorses use of inhalers without effect at home. Explains that the symptoms have persisted for 4 days and have gotten worse. Reports previous similar symptoms when diagnosed with acute pulmonary edema. Mild rhonchi noted on assessment. Patient reports wet non-productive cough.        Past Medical History  Diagnosis Date  . DVT (deep venous thrombosis)     bilateral, 2 episodes: Requires lifelong therapy  . Chronic pain syndrome   . Restless leg syndrome   . Depression   . Hyperlipidemia   . Hypertension   . PVD (peripheral vascular disease)     s/p L fem-pop bypass 11/21/08; graft occluded 12/28/08;  aortogram w/ bilat LE runoff: 1.  bilat diffuse SFA occlusive dz, 2.  Mod to severe above-knee popliteal dz,  3.  Bilat 3-vessel runoff w/ mild tibial occlusive dz.  . Tobacco abuse   . Degenerative joint disease of knee   . Breast lump 02/2008    Biopsy 05/2008: showed no evidence of malignancy  . Irregular menses 9/08  . Pulmonary edema   . Anxiety   . Joint pain   . Mixed stress and urge urinary incontinence     [facility], underwent cystoscopy & uroflowmetry   . Endometrial mass 10/12/2012    Endometrium, biopsy on 11/04/12 - PROLIFERATIVE ENDOMETRIUM AND ABUNDANT MUCUS. NO HYPERPLASIA OR CARCINOMA.   . Sigmoid diverticulitis 10/26/2012  . Asthma   . Breast mass in female     bi lat    Past Surgical History  Procedure Laterality Date  . Femoral bypass  11/09  .  right external iliac artery stent    . Right breast  needle-localized lumpectomy    . Pr vein bypass graft,aorto-fem-pop    . Carotid endarterectomy    . Tubal ligation    . Tonsillectomy    . Cardiac catheterization  2/15  . Breast lumpectomy with radioactive seed localization Bilateral 05/03/2014    Procedure: BREAST LUMPECTOMY WITH RADIOACTIVE SEED LOCALIZATION;  Surgeon: Joyice Faster. Cornett, MD;  Location: [place];  Service: General;  Laterality: Bilateral;   Family History  Problem Relation Age of Onset  . Breast cancer Mother   . Colon cancer Neg Hx   . Rectal cancer Neg Hx   . Stomach cancer Neg Hx   . Breast cancer Other     GREAT AUNT   History  Substance Use Topics  . Smoking status: Current Every Day Smoker -- 0.50 packs/day for 18 years    Types: Cigarettes  . Smokeless tobacco: Never Used     Comment: 3-6 per day  . Alcohol Use: Yes     Comment: rare   OB History   Grav Para Term Preterm Abortions TAB SAB Ect Mult Living   6 4 4  2 1 1   4      Review of Systems All other systems reviewed and are negative   Allergies  Propoxyphene n-acetaminophen  Home Medications   Prior to Admission medications   Medication Sig Start Date  End Date Taking? Authorizing Provider  aspirin 325 MG tablet Take 325 mg by mouth daily.   Yes Historical Provider, MD  atorvastatin (LIPITOR) 40 MG tablet Take 1 tablet (40 mg total) by mouth at bedtime. 11/05/13  Yes Ejiroghene Arlyce Dice, MD  carvedilol (COREG) 25 MG tablet Take 1 tablet (25 mg total) by mouth 2 (two) times daily with a meal. 02/28/14  Yes Karren Cobble, MD  diazepam (VALIUM) 5 MG tablet Take 10 mg by mouth 3 (three) times daily.    Yes Historical Provider, MD  DULoxetine (CYMBALTA) 60 MG capsule Take 60 mg by mouth daily.    Yes Historical Provider, MD  gabapentin (NEURONTIN) 800 MG tablet Take 800-1,600 mg by mouth See admin instructions. Take 1 tablet in the morning, 1 tablet midday and 2 tablets at bedtime   Yes Historical Provider, MD   hydrochlorothiazide (HYDRODIURIL) 12.5 MG tablet Take 1 tablet (12.5 mg total) by mouth daily. 08/11/14  Yes Julious Oka, MD  hydrOXYzine (ATARAX/VISTARIL) 25 MG tablet Take 25 mg by mouth 3 (three) times daily as needed for anxiety.   Yes Historical Provider, MD  lisinopril (PRINIVIL,ZESTRIL) 20 MG tablet Take 1 tablet (20 mg total) by mouth 2 (two) times daily. 08/11/14 08/11/15 Yes Julious Oka, MD  Multiple Vitamins-Minerals (ONE-A-DAY 50 PLUS PO) Take 1 tablet by mouth daily.   Yes Historical Provider, MD  traZODone (DESYREL) 100 MG tablet Take 200-300 mg by mouth at bedtime.    Yes Clarene Reamer, MD  warfarin (COUMADIN) 7.5 MG tablet Take 3.75-7.5 mg by mouth daily. Take 3.75mg  alternating with 7.5mg  daily   Yes Historical Provider, MD  ziprasidone (GEODON) 60 MG capsule Take 60 mg by mouth 2 (two) times daily with a meal.    Yes Historical Provider, MD  albuterol (PROVENTIL HFA;VENTOLIN HFA) 108 (90 BASE) MCG/ACT inhaler Inhale 1-2 puffs into the lungs every 4 (four) hours as needed for wheezing or shortness of breath. 08/29/14   Charlott Rakes, MD  potassium chloride (K-DUR) 10 MEQ tablet TAKE 1 TABLET BY MOUTH EVERY DAY 08/29/14   Thayer Headings, MD   BP 145/81  Pulse 69  Temp(Src) 98.2 F (36.8 C) (Oral)  Resp 18  Ht 5\' 4"  (1.626 m)  Wt 196 lb 13.9 oz (89.3 kg)  BMI 33.78 kg/m2  SpO2 98%  LMP 08/10/2014 Physical Exam Physical Exam  Nursing note and vitals reviewed. Constitutional: She is oriented to person, place, and time. She appears well-developed and well-nourished. No distress.  HENT:  Head: Normocephalic and atraumatic.  Eyes: Pupils are equal, round, and reactive to light.  Neck: Normal range of motion.  Cardiovascular: Normal rate and intact distal pulses.   Pulmonary/Chest: No respiratory distress.  Abdominal: Normal appearance. She exhibits no distension.  Musculoskeletal: Normal range of motion.  Neurological: She is alert and oriented to person, place, and time.  No cranial nerve deficit.  Skin: Skin is warm and dry. No rash noted.  Psychiatric: She has a normal mood and affect. Her behavior is normal.   ED Course  Procedures (including critical care time)  Medications  cloNIDine (CATAPRES) tablet 0.2 mg (0.2 mg Oral Given 08/28/14 2056)  cloNIDine (CATAPRES) tablet 0.2 mg (0.2 mg Oral Given 08/28/14 2208)    Labs Review Labs Reviewed  PROTIME-INR - Abnormal; Notable for the following:    Prothrombin Time 30.1 (*)    INR 2.87 (*)    All other components within normal limits  I-STAT CHEM 8, ED -  Abnormal; Notable for the following:    Potassium 3.4 (*)    Hemoglobin 16.3 (*)    HCT 48.0 (*)    All other components within normal limits  PRO B NATRIURETIC PEPTIDE  TROPONIN I  TROPONIN I  TROPONIN I  I-STAT TROPOININ, ED    Imaging Review No results found.   EKG Interpretation   Date/Time:  Sunday August 28 2014 19:32:11 EDT Ventricular Rate:  78 PR Interval:  172 QRS Duration: 86 QT Interval:  442 QTC Calculation: 503 R Axis:   -14 Text Interpretation:  Normal sinus rhythm Septal infarct , age  undetermined Prolonged QT Abnormal ECG No significant change since last  tracing Confirmed by Evaleen Sant  MD, Nohealani Medinger ([id number]) on 08/28/2014 8:33:22 PM      MDM   Final diagnoses:  Uncontrolled hypertension  Chest pain, unspecified chest pain type        Dot Lanes, MD 08/31/14 2211

## 2014-08-28 NOTE — ED Notes (Signed)
Patient arrives with complaint of shortness of breath accompanied by chest heaviness, lightheadedness, dizziness, weakness, nausea, and left arm paresthesia. States history of asthma and anxiety with exacerbation recently. Endorses use of inhalers without effect at home. Explains that the symptoms have persisted for 4 days and have gotten worse. Reports previous similar symptoms when diagnosed with acute pulmonary edema. Mild rhonchi noted on assessment. Patient reports wet non-productive cough.

## 2014-08-28 NOTE — H&P (Signed)
Date: 08/28/2014               Patient Name:  Alisha [name] MRN: [id number]  DOB: [birth year] Age / Sex: 53 y.o., female   PCP: Julious Oka, MD         Medical Service: Internal Medicine Teaching Service         Attending Physician: Dr. Dot Lanes, MD    First Contact: Dr. Posey Pronto Pager: [phone]  Second Contact: Dr. Hayes Ludwig Pager: [phone]       After Hours (After 5p/  First Contact Pager: [phone]  weekends / holidays): Second Contact Pager: [phone]   Chief Complaint: SOB  History of Present Illness: Ms. Kinnick is a 53 year old woman with history of DVT, chronic pain, depression/anxiety, HTN, PVD, CAD, asthma presenting with SOB and chest pressure x 4 days. She reports that it started out intermittent, worsening and is now constant. She has tried using her albuterol inhaler without relief. No exacerbating or other relieving factors. She also notes that she has been feeling more anxiety. At her last visit with [facility], her trazodone was stopped. She reports PND, orthopnea and dizziness. Denies fevers, chills, vision changes, changes in baseline nonproductive cough, nausea, vomiting, abdominal pain, diarrhea, hematochezia, melena, dysuria, hematuria, edema, rash, changes in chronic myalgias/arthralgias/paresthesias. She reports she is compliant with her medications.  Meds: No current facility-administered medications for this encounter.   Current Outpatient Prescriptions  Medication Sig Dispense Refill  . albuterol (PROVENTIL HFA;VENTOLIN HFA) 108 (90 BASE) MCG/ACT inhaler Inhale 1 puff into the lungs every 4 (four) hours as needed for wheezing or shortness of breath.      Marland Kitchen aspirin 325 MG tablet Take 325 mg by mouth daily.      Marland Kitchen atorvastatin (LIPITOR) 40 MG tablet Take 1 tablet (40 mg total) by mouth at bedtime.  90 tablet  3  . carvedilol (COREG) 25 MG tablet Take 1 tablet (25 mg total) by mouth 2 (two) times daily with a meal.  180 tablet  3  . diazepam (VALIUM) 5 MG tablet Take  10 mg by mouth 3 (three) times daily.       . DULoxetine (CYMBALTA) 60 MG capsule Take 60 mg by mouth daily.       Marland Kitchen gabapentin (NEURONTIN) 800 MG tablet Take 800-1,600 mg by mouth See admin instructions. Take 1 tablet in the morning, 1 tablet midday and 2 tablets at bedtime      . hydrochlorothiazide (HYDRODIURIL) 12.5 MG tablet Take 1 tablet (12.5 mg total) by mouth daily.  90 tablet  1  . hydrOXYzine (ATARAX/VISTARIL) 25 MG tablet Take 25 mg by mouth 3 (three) times daily as needed for anxiety.      Marland Kitchen lisinopril (PRINIVIL,ZESTRIL) 20 MG tablet Take 1 tablet (20 mg total) by mouth 2 (two) times daily.  180 tablet  1  . Multiple Vitamins-Minerals (ONE-A-DAY 50 PLUS PO) Take 1 tablet by mouth daily.      . potassium chloride (KLOR-CON 10) 10 MEQ tablet Take 1 tablet (10 mEq total) by mouth daily.  30 tablet  1  . traZODone (DESYREL) 100 MG tablet Take 200-300 mg by mouth at bedtime.       Marland Kitchen warfarin (COUMADIN) 7.5 MG tablet Take 3.75-7.5 mg by mouth daily. Take 3.75mg  alternating with 7.5mg  daily      . ziprasidone (GEODON) 60 MG capsule Take 60 mg by mouth 2 (two) times daily with a meal.         Allergies:  Allergies as of 08/28/2014 - Review Complete 08/28/2014  Allergen Reaction Noted  . Propoxyphene n-acetaminophen Swelling    Past Medical History  Diagnosis Date  . DVT (deep venous thrombosis)     bilateral, 2 episodes: Requires lifelong therapy  . Chronic pain syndrome   . Restless leg syndrome   . Depression   . Hyperlipidemia   . Hypertension   . PVD (peripheral vascular disease)     s/p L fem-pop bypass 11/21/08; graft occluded 12/28/08;  aortogram w/ bilat LE runoff: 1.  bilat diffuse SFA occlusive dz, 2.  Mod to severe above-knee popliteal dz,  3.  Bilat 3-vessel runoff w/ mild tibial occlusive dz.  . Tobacco abuse   . Degenerative joint disease of knee   . Breast lump 02/2008    Biopsy 05/2008: showed no evidence of malignancy  . Irregular menses 9/08  . Pulmonary edema     . Anxiety   . Joint pain   . Mixed stress and urge urinary incontinence     [facility] by [facility], underwent cystoscopy & uroflowmetry   . Endometrial mass 10/12/2012    Endometrium, biopsy on 11/04/12 - PROLIFERATIVE ENDOMETRIUM AND ABUNDANT MUCUS. NO HYPERPLASIA OR CARCINOMA.   . Sigmoid diverticulitis 10/26/2012  . Asthma   . Breast mass in female     bi lat    Past Surgical History  Procedure Laterality Date  . Femoral bypass  11/09  .  right external iliac artery stent    . Right breast needle-localized lumpectomy    . Pr vein bypass graft,aorto-fem-pop    . Carotid endarterectomy    . Tubal ligation    . Tonsillectomy    . Cardiac catheterization  2/15  . Breast lumpectomy with radioactive seed localization Bilateral 05/03/2014    Procedure: BREAST LUMPECTOMY WITH RADIOACTIVE SEED LOCALIZATION;  Surgeon: Joyice Faster. Cornett, MD;  Location: [place];  Service: General;  Laterality: Bilateral;   Family History  Problem Relation Age of Onset  . Breast cancer Mother   . Colon cancer Neg Hx   . Rectal cancer Neg Hx   . Stomach cancer Neg Hx   . Breast cancer Other     GREAT AUNT   History   Social History  . Marital Status: Divorced    Spouse Name: N/A    Number of Children: 71  . Years of Education: N/A   Occupational History  . disable    Social History Main Topics  . Smoking status: Current Every Day Smoker -- 0.50 packs/day for 18 years    Types: Cigarettes  . Smokeless tobacco: Never Used     Comment: 3-6 per day  . Alcohol Use: Yes     Comment: rare  . Drug Use: No     Comment: hx of marijuana and cocaine use  . Sexual Activity: No   Other Topics Concern  . Not on file   Social History Narrative   Financial assistance application initiated. Patient needs to submit further paperwork to complete   [facility]  September 11, 2010 2:04 PM   Financial assistance approved for 100% discount at [facility] and has [facility] card   [name]  October 04, 2010 5:29 PM    Review of Systems: Constitutional: no fevers/chills Eyes: no vision changes Ears, nose, mouth, throat, and face: no changes in cough Respiratory: +shortness of breath Cardiovascular: +chest pressure, no chest pain Gastrointestinal: no nausea/vomiting, no abdominal pain, no constipation, no diarrhea  Genitourinary: no dysuria, no hematuria Integument: no rash Hematologic/lymphatic: no bleeding/bruising, no edema Musculoskeletal: +chronic arthralgias, +chronic myalgias Neurological: +chronic paresthesias, no weakness  Physical Exam: Blood pressure 178/111, pulse 68, temperature 98 F (36.7 C), temperature source Oral, resp. rate 16, height 5\' 4"  (1.626 m), weight 196 lb (88.905 kg), last menstrual period 08/10/2014, SpO2 96.00%. General Apperance: NAD Head: Normocephalic, atraumatic Eyes: PERRL, EOMI, anicteric sclera Ears: Normal external ear canal Nose: Nares normal, septum midline, mucosa normal Throat: Lips, mucosa and tongue normal  Neck: Supple, trachea midline Back: No tenderness or bony abnormality  Lungs: Minimal scattered crackles, otherwise clear to auscultation bilaterally. No wheezes, rhonchi or rales. Breathing comfortably Chest Wall: TTP, no deformity Heart: Regular rate and rhythm, no murmur/rub/gallop Abdomen: Soft, nontender, nondistended, no rebound/guarding Extremities: Normal, atraumatic, warm and well perfused, no edema Pulses: 2+ throughout Skin: No rashes or lesions Neurologic: Alert and oriented x 3. CNII-XII intact. Normal strength and sensation  Lab results: Basic Metabolic Panel:  Recent Labs  08/28/14 2106  NA 143  K 3.4*  CL 109  GLUCOSE 89  BUN 9  CREATININE 0.90   CBC:  Recent Labs  08/28/14 2106  HGB 16.3*  HCT 48.0*   BNP:  Recent Labs  08/28/14 2058  PROBNP 111.3   Coagulation:  Recent Labs  08/28/14 2058  LABPROT 30.1*  INR 2.87*   Misc. Labs: Troponin POC 0.00  Imaging  results:  Dg Chest 2 View  08/28/2014   CLINICAL DATA:  Shortness of breath. History of hypertension and pulmonary edema.  EXAM: CHEST  2 VIEW  COMPARISON:  02/21/2014  FINDINGS: The heart size and mediastinal contours are within normal limits. Both lungs are clear. The visualized skeletal structures are unremarkable.  IMPRESSION: No active cardiopulmonary disease.   Electronically Signed   By: Rolm Baptise M.D.   On: 08/28/2014 21:23    Other results: EKG: Normal sinus rhythm, prolonged QT 534ms, T wave inversion in II, aVR, V3-V6, otherwise unchanged from previous  Assessment & Plan by Problem: Principal Problem:   Hypertension Active Problems:   HYPERLIPIDEMIA, MIXED   TOBACCO ABUSE   Chronic pain syndrome   HYPERTENSION   PERIPHERAL VASCULAR DISEASE   DVT   Anxiety   HTN (hypertension)  Shortness of breath, chest pressure: Differential includes ACS, anxiety, asthma, or PE. No acute findings on CXR making pnuemonia or pulmonary edema less likely. She had a left heart cath February 2015 which showed nonobstructive CAD (moderate calcification in the proximal with diffuse 20% disease in the LAD) and LV EF 55-65%. Initial EKG with T wave normalization in anterolateral leads and troponin 0. TIMI score is 3 (>3 CAD risk factors, ASA use, severe angina) 13% risk at 14 days. Pt reports anxiety - recently taken off of trazodone. Has been anxious recently because of ill mother. No wheezes heard on exam, making acute asthma exacerbation less likely. Low risk Wells score 1.5 points (previous DVT) and moderate risk Geneva score 6 points (previous DVT and HR 75-94). Therapeutic on coumadin.  -Repeat EKG in AM -Trend troponins -2d echo in AM -albuterol 82ml neb Q4hr prn SOB/wheezing -continue ASA 325mg  -continue statin -GI cocktail prn -morphine 2mg  Q2hr prn pain  HTN: hypertensive upon initial presentation. She had not taken her medications today.  -continue carvedilol 25mg  BID -continue  clonidine 0.2mg  -continue HCTZ 12.5mg  daily -continue lisinopril 20mg  BID  Depression/anxiety: recently taken off of trazodone. Has been anxious recently because of ill mother. -continue valium 10mg  TID -continue  Cymbalta 60mg  daily -continue hydroxyzine 25mg  TID prn anxiety -continue Geodon 60mg  BID  history of DVT: INR 2.87 -continue warfarin, daily INR  chronic pain -continue gabapentin 800mg  BID  FEN: heart healthy diet  DVT Ppx: continue home coumadin  Dispo: Disposition is deferred at this time, awaiting improvement of current medical problems. Anticipated discharge in approximately 1 day(s).   The patient does have a current PCP Julious Oka, MD) and does need an [facility] hospital follow-up appointment after discharge.  The patient does not know have transportation limitations that hinder transportation to clinic appointments.  Signed: Jacques Earthly, MD 08/28/2014, 10:47 PM

## 2014-08-29 ENCOUNTER — Telehealth: Payer: Self-pay | Admitting: Internal Medicine

## 2014-08-29 DIAGNOSIS — R110 Nausea: Secondary | ICD-10-CM | POA: Diagnosis not present

## 2014-08-29 DIAGNOSIS — R079 Chest pain, unspecified: Secondary | ICD-10-CM

## 2014-08-29 DIAGNOSIS — R42 Dizziness and giddiness: Secondary | ICD-10-CM | POA: Diagnosis not present

## 2014-08-29 DIAGNOSIS — I10 Essential (primary) hypertension: Secondary | ICD-10-CM

## 2014-08-29 DIAGNOSIS — G8929 Other chronic pain: Secondary | ICD-10-CM

## 2014-08-29 DIAGNOSIS — E876 Hypokalemia: Secondary | ICD-10-CM

## 2014-08-29 LAB — TROPONIN I: Troponin I: <0.3 ng/mL (ref <0.30)

## 2014-08-29 MED ORDER — ZIPRASIDONE HCL 60 MG PO CAPS
60.0000 mg | ORAL_CAPSULE | Freq: Two times a day (BID) | ORAL | Status: DC
  Administered 2014-08-29: 60 mg via ORAL
  Filled 2014-08-29 (×3): qty 1

## 2014-08-29 MED ORDER — ASPIRIN 325 MG PO TABS
325.0000 mg | ORAL_TABLET | Freq: Every day | ORAL | Status: DC
  Administered 2014-08-29: 325 mg via ORAL
  Filled 2014-08-29: qty 1

## 2014-08-29 MED ORDER — GABAPENTIN 400 MG PO CAPS
800.0000 mg | ORAL_CAPSULE | Freq: Two times a day (BID) | ORAL | Status: DC
  Administered 2014-08-29 (×2): 800 mg via ORAL
  Filled 2014-08-29 (×3): qty 2

## 2014-08-29 MED ORDER — GABAPENTIN 400 MG PO CAPS
800.0000 mg | ORAL_CAPSULE | Freq: Two times a day (BID) | ORAL | Status: DC
  Filled 2014-08-29 (×2): qty 2

## 2014-08-29 MED ORDER — GABAPENTIN 400 MG PO CAPS
800.0000 mg | ORAL_CAPSULE | Freq: Every day | ORAL | Status: DC
  Administered 2014-08-29: 800 mg via ORAL
  Filled 2014-08-29 (×2): qty 2

## 2014-08-29 MED ORDER — HYDROXYZINE HCL 25 MG PO TABS: 25.0000 mg | ORAL_TABLET | Freq: Three times a day (TID) | ORAL | Status: DC | PRN

## 2014-08-29 MED ORDER — ONDANSETRON HCL 4 MG/2ML IJ SOLN: 4.0000 mg | Freq: Four times a day (QID) | INTRAMUSCULAR | Status: DC | PRN

## 2014-08-29 MED ORDER — ALBUTEROL SULFATE HFA 108 (90 BASE) MCG/ACT IN AERS: 1.0000 | INHALATION_SPRAY | RESPIRATORY_TRACT | Status: DC | PRN

## 2014-08-29 MED ORDER — CARVEDILOL 25 MG PO TABS
25.0000 mg | ORAL_TABLET | Freq: Two times a day (BID) | ORAL | Status: DC
  Administered 2014-08-29: 25 mg via ORAL
  Filled 2014-08-29 (×3): qty 1

## 2014-08-29 MED ORDER — GI COCKTAIL ~~LOC~~: 30.0000 mL | Freq: Four times a day (QID) | ORAL | Status: DC | PRN

## 2014-08-29 MED ORDER — LISINOPRIL 20 MG PO TABS
20.0000 mg | ORAL_TABLET | Freq: Two times a day (BID) | ORAL | Status: DC
  Administered 2014-08-29: 20 mg via ORAL
  Filled 2014-08-29 (×3): qty 1

## 2014-08-29 MED ORDER — GABAPENTIN 800 MG PO TABS
800.0000 mg | ORAL_TABLET | Freq: Two times a day (BID) | ORAL | Status: DC
  Filled 2014-08-29 (×3): qty 1

## 2014-08-29 MED ORDER — DULOXETINE HCL 60 MG PO CPEP
60.0000 mg | ORAL_CAPSULE | Freq: Every day | ORAL | Status: DC
  Administered 2014-08-29: 60 mg via ORAL
  Filled 2014-08-29: qty 1

## 2014-08-29 MED ORDER — DIAZEPAM 5 MG PO TABS
10.0000 mg | ORAL_TABLET | Freq: Three times a day (TID) | ORAL | Status: DC
  Administered 2014-08-29 (×2): 10 mg via ORAL
  Filled 2014-08-29 (×2): qty 2

## 2014-08-29 MED ORDER — ALBUTEROL SULFATE (2.5 MG/3ML) 0.083% IN NEBU: 3.0000 mL | INHALATION_SOLUTION | RESPIRATORY_TRACT | Status: DC | PRN

## 2014-08-29 MED ORDER — ACETAMINOPHEN 325 MG PO TABS: 650.0000 mg | ORAL_TABLET | ORAL | Status: DC | PRN

## 2014-08-29 MED ORDER — ATORVASTATIN CALCIUM 40 MG PO TABS
40.0000 mg | ORAL_TABLET | Freq: Every day | ORAL | Status: DC
  Administered 2014-08-29: 40 mg via ORAL
  Filled 2014-08-29 (×2): qty 1

## 2014-08-29 MED ORDER — NICOTINE 14 MG/24HR TD PT24
14.0000 mg | MEDICATED_PATCH | Freq: Every day | TRANSDERMAL | Status: DC
  Administered 2014-08-29: 14 mg via TRANSDERMAL
  Filled 2014-08-29: qty 1

## 2014-08-29 MED ORDER — HEPARIN SODIUM (PORCINE) 5000 UNIT/ML IJ SOLN: 5000.0000 [IU] | Freq: Three times a day (TID) | INTRAMUSCULAR | Status: DC

## 2014-08-29 MED ORDER — HYDROCHLOROTHIAZIDE 25 MG PO TABS
12.5000 mg | ORAL_TABLET | Freq: Every day | ORAL | Status: DC
  Administered 2014-08-29: 12.5 mg via ORAL
  Filled 2014-08-29: qty 0.5

## 2014-08-29 MED ORDER — MORPHINE SULFATE 2 MG/ML IJ SOLN: 2.0000 mg | INTRAMUSCULAR | Status: DC | PRN

## 2014-08-29 NOTE — Discharge Instructions (Signed)
Thank you for trusting [name] with your medical care!  You were hospitalized for atypical chest pain and were monitored overnight. All of your heart enzymes and EKGs were reassuring, so we feel this pain was due to your anxiety.   Please follow-up with your psychiatrist during your scheduled appointment to discuss this issue with them. Continue working on deep breathing to help alleviate your symptoms.    Stress and Stress Management Stress is a normal reaction to life events. It is what you feel when life demands more than you are used to or more than you can handle. Some stress can be useful. For example, the stress reaction can help you catch the last bus of the day, study for a test, or meet a deadline at work. But stress that occurs too often or for too long can cause problems. It can affect your emotional health and interfere with relationships and normal daily activities. Too much stress can weaken your immune system and increase your risk for physical illness. If you already have a medical problem, stress can make it worse. CAUSES  All sorts of life events may cause stress. An event that causes stress for one person may not be stressful for another person. Major life events commonly cause stress. These may be positive or negative. Examples include losing your job, moving into a new home, getting married, having a baby, or losing a loved one. Less obvious life events may also cause stress, especially if they occur day after day or in combination. Examples include working long hours, driving in traffic, caring for children, being in debt, or being in a difficult relationship. SIGNS AND SYMPTOMS Stress may cause emotional symptoms including, the following:  Anxiety. This is feeling worried, afraid, on edge, overwhelmed, or out of control.  Anger. This is feeling irritated or impatient.  Depression. This is feeling sad, down, helpless, or guilty.  Difficulty focusing, remembering, or making  decisions. Stress may cause physical symptoms, including the following:   Aches and pains. These may affect your head, neck, back, stomach, or other areas of your body.  Tight muscles or clenched jaw.  Low energy or trouble sleeping. Stress may cause unhealthy behaviors, including the following:   Eating to feel better (overeating) or skipping meals.  Sleeping too little, too much, or both.  Working too much or putting off tasks (procrastination).  Smoking, drinking alcohol, or using drugs to feel better. DIAGNOSIS  Stress is diagnosed through an assessment by your health care provider. Your health care provider will ask questions about your symptoms and any stressful life events.Your health care provider will also ask about your medical history and may order blood tests or other tests. Certain medical conditions and medicine can cause physical symptoms similar to stress. Mental illness can cause emotional symptoms and unhealthy behaviors similar to stress. Your health care provider may refer you to a mental health professional for further evaluation.  TREATMENT  Stress management is the recommended treatment for stress.The goals of stress management are reducing stressful life events and coping with stress in healthy ways.  Techniques for reducing stressful life events include the following:  Stress identification. Self-monitor for stress and identify what causes stress for you. These skills may help you to avoid some stressful events.  Time management. Set your priorities, keep a calendar of events, and learn to say "no." These tools can help you avoid making too many commitments. Techniques for coping with stress include the following:  Rethinking the problem. Try  to think realistically about stressful events rather than ignoring them or overreacting. Try to find the positives in a stressful situation rather than focusing on the negatives.  Exercise. Physical exercise can release  both physical and emotional tension. The key is to find a form of exercise you enjoy and do it regularly.  Relaxation techniques. These relax the body and mind. Examples include yoga, meditation, tai chi, biofeedback, deep breathing, progressive muscle relaxation, listening to music, being out in nature, journaling, and other hobbies. Again, the key is to find one or more that you enjoy and can do regularly.  Healthy lifestyle. Eat a balanced diet, get plenty of sleep, and do not smoke. Avoid using alcohol or drugs to relax.  Strong support network. Spend time with family, friends, or other people you enjoy being around.Express your feelings and talk things over with someone you trust. Counseling or talktherapy with a mental health professional may be helpful if you are having difficulty managing stress on your own. Medicine is typically not recommended for the treatment of stress.Talk to your health care provider if you think you need medicine for symptoms of stress. HOME CARE INSTRUCTIONS  Keep all follow-up visits as directed by your health care provider.  Take all medicines as directed by your health care provider. SEEK MEDICAL CARE IF:  Your symptoms get worse or you start having new symptoms.  You feel overwhelmed by your problems and can no longer manage them on your own. SEEK IMMEDIATE MEDICAL CARE IF:  You feel like hurting yourself or someone else. Document Released: 06/11/2001 Document Revised: 05/02/2014 Document Reviewed: 08/10/2013 [facility] Patient Information 2015 [place], [place]. This information is not intended to replace advice given to you by your health care provider. Make sure you discuss any questions you have with your health care provider.

## 2014-08-29 NOTE — Progress Notes (Addendum)
Subjective: She is tearful this AM about what she experienced. She was reassured that her heart and lungs were fine and that her anxiety was the likely cause of what she experienced. She did not complain of any pain this AM.  Objective: Vital signs in last 24 hours: Filed Vitals:   08/28/14 2345 08/29/14 0017 08/29/14 0305 08/29/14 0520  BP: 159/88 140/94 116/84 145/81  Pulse: 60 65  69  Temp:  97.5 F (36.4 C)  98.2 F (36.8 C)  TempSrc:  Oral  Oral  Resp: 18 18  18   Height:  5\' 4"  (1.626 m)    Weight:  196 lb 13.9 oz (89.3 kg)  196 lb 13.9 oz (89.3 kg)  SpO2: 94% 94%  98%   Weight change:   Intake/Output Summary (Last 24 hours) at 08/29/14 1338 Last data filed at 08/29/14 0600  Gross per 24 hour  Intake    240 ml  Output      0 ml  Net    240 ml   General: resting in bed while reading a book, NAD HEENT: PERRL, EOMI, no scleral icterus Cardiac: RRR, no rubs, murmurs or gallops Pulm: clear to auscultation bilaterally, no wheezes, rales, or rhonchi Abd: soft, nontender, nondistended, BS present Ext: warm and well perfused, wearing stocking, trace edema L > R that's baseline per the patient Neuro: responds to questions appropriately; moving all extremities freely    Lab Results: Basic Metabolic Panel:  Recent Labs Lab 08/28/14 2106  NA 143  K 3.4*  CL 109  GLUCOSE 89  BUN 9  CREATININE 0.90   CBC:  Recent Labs Lab 08/28/14 2106  HGB 16.3*  HCT 48.0*   Cardiac Enzymes:  Recent Labs Lab 08/29/14 0133 08/29/14 0257 08/29/14 0629  TROPONINI <0.30 <0.30 <0.30   Studies/Results: Dg Chest 2 View  08/28/2014   CLINICAL DATA:  Shortness of breath. History of hypertension and pulmonary edema.  EXAM: CHEST  2 VIEW  COMPARISON:  02/21/2014  FINDINGS: The heart size and mediastinal contours are within normal limits. Both lungs are clear. The visualized skeletal structures are unremarkable.  IMPRESSION: No active cardiopulmonary disease.   Electronically  Signed   By: Rolm Baptise M.D.   On: 08/28/2014 21:23   Medications: I have reviewed the patient's current medications. Scheduled Meds: . aspirin  325 mg Oral Daily  . atorvastatin  40 mg Oral QHS  . carvedilol  25 mg Oral BID WC  . diazepam  10 mg Oral TID  . DULoxetine  60 mg Oral Daily  . gabapentin  800 mg Oral QHS  . gabapentin  800 mg Oral BID WC  . hydrochlorothiazide  12.5 mg Oral Daily  . lisinopril  20 mg Oral BID  . nicotine  14 mg Transdermal Daily  . ziprasidone  60 mg Oral BID WC   Continuous Infusions:  PRN Meds:.acetaminophen, albuterol, gi cocktail, hydrOXYzine, morphine injection, ondansetron (ZOFRAN) IV Assessment/Plan: Principal Problem:   Hypertension Active Problems:   HYPERLIPIDEMIA, MIXED   TOBACCO ABUSE   Chronic pain syndrome   HYPERTENSION   PERIPHERAL VASCULAR DISEASE   DVT   Anxiety   HTN (hypertension)  Ms. Fukuhara is a 53 year old woman with h/o of DVT, chronic pain, depression/anxiety, HTN, PVD, CAD, asthma hospitalized for atypical chest pain found to have hypokalemia.  #Atypical chest pain: Troponins unremarkable x 3, telemetry unremarkable overnight, and EKG findings were reassuring to r/o ACS. Her pain is likely related to her anxiety and may  be related to recent stress. Trazodone was recently stopped though it's unknown if this medication was generally tapered down to avoid side effects over several weeks; none fit her presentation (sedation, orthostatic hypotension, headache, nausea, dizziness). She denies dyspnea and feels fit for discharge. -Advised her to practice deep breathing when symptoms arise and to follow-up with [facility] at her next appointment -Scheduled her for follow-up with PCP -Cancelled echo  #Hypokalemia: K 3.4 overnight. She is advised to continue her K supplementation after discharge.  #HTN: BP trending 140/90. Stable on home medication.  #Depression/anxiety: Stable on home medication.   #H/o DVT: Stable on home  medication.  #Chronic pain: Stable on home medication.  #FEN:  -Diet: Heart Healthy  #DVT prophylaxis: warfarin  #CODE STATUS: FULL CODE  Dispo: Disposition is deferred at this time, awaiting improvement of current medical problems.  Anticipated discharge in approximately 1 day(s).   The patient does have a current PCP Julious Oka, MD) and does need an [facility] hospital follow-up appointment after discharge.  The patient does not have transportation limitations that hinder transportation to clinic appointments.  .Services Needed at time of discharge: Y = Yes, Blank = No PT:   OT:   RN:   Equipment:   Other:     LOS: 1 day   Charlott Rakes, MD 08/29/2014, 1:38 PM

## 2014-08-29 NOTE — H&P (Signed)
  Date: 08/29/2014  Patient name: [name] record number: [id number]  Date of birth: 06/30/1961   I have seen and evaluated Alisha [name] and discussed their care with the Residency Team. Alisha [name] has non obstructive CAD as seen on cath 01/2014, anxiety and depression managed at [facility], asthma, HTN, PVD s/p fem-pop bypass, and h/o DVT. She presented with increasing CP and SOB over 4 days. She had palps and felt that she was going to die. She never felt like she could get enough air. She has had to increase her use of albuterol, keep a fan on her, and keep the [facility] turned down low but did not get any relief. Her W/U has included a nl CXR, nl EKG, negative Trop I x 3, HgB 16, and a therapeutic INR (has h/o repeated DVT's).   Her vitals are stable except for an elevated BP but it has come down since admit. HRRR no MRG. LCTAB no wheezing and good air flow.   Assessment and Plan: I have seen and evaluated the patient as outlined above. I agree with the formulated Assessment and Plan as detailed in the residents' admission note, with the following changes:   1. Dyspnea and CP - serious causes have been R/O. It is possible that this is anxiety related and we will encourage her to return to [facility] for med assessment and to be taught stress reduction techniques. If these do not work in future, I did encourage her to come to [facility] / ED as she could have an alternative cause.  She is stable for D/C home and [facility] F/U.  Bartholomew Crews, MD 8/31/20154:18 PM

## 2014-08-29 NOTE — Telephone Encounter (Signed)
Rec'd Faxed confirmation from [facility].  Patient has not responded to calls/message to schedule an appointment with them.

## 2014-08-30 DIAGNOSIS — E876 Hypokalemia: Secondary | ICD-10-CM

## 2014-08-30 DIAGNOSIS — R0789 Other chest pain: Secondary | ICD-10-CM | POA: Diagnosis present

## 2014-08-30 DIAGNOSIS — I160 Hypertensive urgency: Secondary | ICD-10-CM | POA: Diagnosis present

## 2014-08-30 DIAGNOSIS — T502X5A Adverse effect of carbonic-anhydrase inhibitors, benzothiadiazides and other diuretics, initial encounter: Secondary | ICD-10-CM

## 2014-08-30 NOTE — Discharge Summary (Signed)
Name: Alisha [name] MRN: [id number] DOB: [birth year] 53 y.o. PCP: Julious Oka, MD  Date of Admission: 08/28/2014  7:35 PM Date of Discharge: 08/29/2014 Attending Physician: Larey Dresser, MD  Discharge Diagnosis: Principal Problem:   Atypical chest pain Active Problems:   HYPERLIPIDEMIA, MIXED   TOBACCO ABUSE   DEPRESSION   Chronic pain syndrome   PERIPHERAL VASCULAR DISEASE   DVT   Anxiety   Hypertensive urgency   Diuretic-induced hypokalemia  Discharge Medications:   Medication List         albuterol 108 (90 BASE) MCG/ACT inhaler  Commonly known as:  PROVENTIL HFA;VENTOLIN HFA  Inhale 1-2 puffs into the lungs every 4 (four) hours as needed for wheezing or shortness of breath.     aspirin 325 MG tablet  Take 325 mg by mouth daily.     atorvastatin 40 MG tablet  Commonly known as:  LIPITOR  Take 1 tablet (40 mg total) by mouth at bedtime.     carvedilol 25 MG tablet  Commonly known as:  COREG  Take 1 tablet (25 mg total) by mouth 2 (two) times daily with a meal.     diazepam 5 MG tablet  Commonly known as:  VALIUM  Take 10 mg by mouth 3 (three) times daily.     DULoxetine 60 MG capsule  Commonly known as:  CYMBALTA  Take 60 mg by mouth daily.     gabapentin 800 MG tablet  Commonly known as:  NEURONTIN  Take 800-1,600 mg by mouth See admin instructions. Take 1 tablet in the morning, 1 tablet midday and 2 tablets at bedtime     GEODON 60 MG capsule  Generic drug:  ziprasidone  Take 60 mg by mouth 2 (two) times daily with a meal.     hydrochlorothiazide 12.5 MG tablet  Commonly known as:  HYDRODIURIL  Take 1 tablet (12.5 mg total) by mouth daily.     hydrOXYzine 25 MG tablet  Commonly known as:  ATARAX/VISTARIL  Take 25 mg by mouth 3 (three) times daily as needed for anxiety.     lisinopril 20 MG tablet  Commonly known as:  PRINIVIL,ZESTRIL  Take 1 tablet (20 mg total) by mouth 2 (two) times daily.     ONE-A-DAY 50 PLUS PO  Take 1 tablet by  mouth daily.     potassium chloride 10 MEQ tablet  Commonly known as:  K-DUR  TAKE 1 TABLET BY MOUTH EVERY DAY     traZODone 100 MG tablet  Commonly known as:  DESYREL  Take 200-300 mg by mouth at bedtime.     warfarin 7.5 MG tablet  Commonly known as:  COUMADIN  Take 3.75-7.5 mg by mouth daily. Take 3.75mg  alternating with 7.5mg  daily        Disposition and follow-up:   Alisha [name] was discharged from [facility] in Stable condition.  At the hospital follow up visit please address:  1.  Anxiety & stress management: f/u with [facility]  2.  BP: adherence to treatment  3.  Hypokalemia: adherence to supplementation  4.  Labs / imaging needed at time of follow-up: BMET (recheck K)  5.  Pending labs/ test needing follow-up: none  Follow-up Appointments:     Follow-up Information   Follow up with Julious Oka, MD On 09/07/2014. (315PM)    Specialty:  Internal Medicine   Contact information:   [address] [place] [address] [phone]  Discharge Instructions: Discharge Instructions   Call MD for:  difficulty breathing, headache or visual disturbances    Complete by:  As directed      Call MD for:  severe uncontrolled pain    Complete by:  As directed      Call MD for:  temperature >100.4    Complete by:  As directed      Diet - low sodium heart healthy    Complete by:  As directed      Increase activity slowly    Complete by:  As directed            Consultations:    Procedures Performed:  Dg Chest 2 View  08/28/2014   CLINICAL DATA:  Shortness of breath. History of hypertension and pulmonary edema.  EXAM: CHEST  2 VIEW  COMPARISON:  02/21/2014  FINDINGS: The heart size and mediastinal contours are within normal limits. Both lungs are clear. The visualized skeletal structures are unremarkable.  IMPRESSION: No active cardiopulmonary disease.   Electronically Signed   By: Rolm Baptise M.D.   On: 08/28/2014 21:23    Admission HPI:  Alisha [name] is a 53 year old woman with history of DVT, chronic pain, depression/anxiety, HTN, PVD, CAD, asthma presenting with SOB and chest pressure x 4 days. She reports that it started out intermittent, worsening and is now constant. She has tried using her albuterol inhaler without relief. No exacerbating or other relieving factors. She also notes that she has been feeling more anxiety. At her last visit with [facility], her trazodone was stopped. She reports PND, orthopnea and dizziness. Denies fevers, chills, vision changes, changes in baseline nonproductive cough, nausea, vomiting, abdominal pain, diarrhea, hematochezia, melena, dysuria, hematuria, edema, rash, changes in chronic myalgias/arthralgias/paresthesias. She reports she is compliant with her medications.   Hospital Course by problem list:  #Atypical chest pain: Troponins unremarkable x 3, telemetry unremarkable overnight, and EKG findings were reassuring to r/o ACS. She was explained that her pain is likely related to her anxiety and may be related to recent stress. Breathing exercises were demonstrated for better control of her symptoms, and she was asked to follow-up with [facility].   #Hypertensive urgency: She had not taken her home medications prior to admission. BP improved 190s/110s->140s/80s. She was advised to continue taking her medication at discharge.  #Hypokalemia: Possibly 2/2 HCTZ use though it's low- dose. She was advised to continue her home supplements after discharge given K 3.4 on admission.  #H/o DVT: INR therapeutic at 2.87. She was continued on her home dose of warfarin.  #Depression: Remained stable on home medications.  #Chronic pain syndrome: Remained stable on home medications.  #Peripheral vascular disease: Remained stable on home medications.   Discharge Vitals:   BP 145/81  Pulse 69  Temp(Src) 98.2 F (36.8 C) (Oral)  Resp 18  Ht 5\' 4"  (1.626 m)  Wt 196 lb 13.9 oz (89.3 kg)  BMI 33.78 kg/m2  SpO2  98%  LMP 08/10/2014  Discharge Labs:  No results found for this or any previous visit (from the past 24 hour(s)).  Signed: Charlott Rakes, MD 08/30/2014, 7:45 AM    Services Ordered on Discharge: None Equipment Ordered on Discharge: None

## 2014-09-07 ENCOUNTER — Encounter: Payer: Medicare Other | Admitting: Internal Medicine

## 2014-09-12 ENCOUNTER — Ambulatory Visit (INDEPENDENT_AMBULATORY_CARE_PROVIDER_SITE_OTHER): Payer: Medicare Other | Admitting: Pharmacist

## 2014-09-12 DIAGNOSIS — I82409 Acute embolism and thrombosis of unspecified deep veins of unspecified lower extremity: Secondary | ICD-10-CM

## 2014-09-12 DIAGNOSIS — Z7901 Long term (current) use of anticoagulants: Secondary | ICD-10-CM | POA: Diagnosis not present

## 2014-09-12 DIAGNOSIS — F17200 Nicotine dependence, unspecified, uncomplicated: Secondary | ICD-10-CM | POA: Diagnosis not present

## 2014-09-12 DIAGNOSIS — N644 Mastodynia: Secondary | ICD-10-CM | POA: Diagnosis not present

## 2014-09-12 DIAGNOSIS — I739 Peripheral vascular disease, unspecified: Secondary | ICD-10-CM | POA: Diagnosis not present

## 2014-09-12 DIAGNOSIS — IMO0002 Reserved for concepts with insufficient information to code with codable children: Secondary | ICD-10-CM | POA: Diagnosis not present

## 2014-09-12 DIAGNOSIS — I10 Essential (primary) hypertension: Secondary | ICD-10-CM | POA: Diagnosis not present

## 2014-09-12 LAB — POCT INR: INR: 1.3

## 2014-09-12 NOTE — Patient Instructions (Signed)
Patient instructed to take medications as defined in the Anti-coagulation Track section of this encounter.  Patient instructed to take today's dose of 1 and 1/2 tablets and then follow coumadin dosing sheet. Patient verbalized understanding of these instructions.

## 2014-09-12 NOTE — Progress Notes (Signed)
Alisha [name] is a 53 y.o. female who is currently on an anti-coagulation regimen.    RECENT RESULTS: Recent results are below, the most recent result is correlated with a dose of 67.5 mg. per week: Lab Results  Component Value Date   INR 1.3 09/12/2014   INR 2.87* 08/28/2014   INR 2.4 08/22/2014    ANTI-COAG DOSE: Anticoagulation Dose Instructions as of 09/12/2014     Dorene Grebe Tue Wed Thu Fri Sat   New Dose 11.25 mg 7.5 mg 11.25 mg 11.25 mg 11.25 mg 7.5 mg 15 mg    Description       Pt will take 1 and 1/2 tablets today x 1 and then follow schedule until next follow up appointment       ANTICOAG SUMMARY: Anticoagulation Episode Summary   Current INR goal 2.0-3.0  Next INR check 09/26/2014  INR from last check 1.3! (09/12/2014)  Weekly max dose   Target end date   [facility] check [facility]  Preferred lab   Send INR reminders to ANTICOAG IMP   Indications  DVT [453.40] Long term current use of anticoagulant [V58.61]        Comments       Anticoagulation Care Providers   Provider Role Specialty Phone number   Burman Freestone, MD  Internal Medicine [phone]      ANTICOAG TODAY: Anticoagulation Summary as of 09/12/2014   INR goal 2.0-3.0  Selected INR 1.3! (09/12/2014)  Next INR check 09/26/2014  Target end date    Indications  DVT [453.40] Long term current use of anticoagulant [V58.61]      Anticoagulation Episode Summary   [facility] check [facility]   Preferred lab    Send INR reminders to ANTICOAG IMP   Comments     Anticoagulation Care Providers   Provider Role Specialty Phone number   Burman Freestone, MD  Internal Medicine [phone]      PATIENT INSTRUCTIONS: Patient Instructions  Patient instructed to take medications as defined in the Anti-coagulation Track section of this encounter.  Patient instructed to take today's dose of 1 and 1/2 tablets and then follow coumadin dosing sheet. Patient verbalized understanding of these  instructions.      FOLLOW-UP No Follow-up on file.  [name], PharmD PGY1 Pharmacy Resident

## 2014-09-21 ENCOUNTER — Encounter: Payer: Self-pay | Admitting: Internal Medicine

## 2014-09-21 ENCOUNTER — Encounter: Payer: Medicare Other | Admitting: Internal Medicine

## 2014-09-26 ENCOUNTER — Ambulatory Visit (INDEPENDENT_AMBULATORY_CARE_PROVIDER_SITE_OTHER): Payer: Medicare Other | Admitting: Pharmacist

## 2014-09-26 DIAGNOSIS — N644 Mastodynia: Secondary | ICD-10-CM | POA: Diagnosis not present

## 2014-09-26 DIAGNOSIS — I739 Peripheral vascular disease, unspecified: Secondary | ICD-10-CM | POA: Diagnosis not present

## 2014-09-26 DIAGNOSIS — I82409 Acute embolism and thrombosis of unspecified deep veins of unspecified lower extremity: Secondary | ICD-10-CM | POA: Diagnosis not present

## 2014-09-26 DIAGNOSIS — I10 Essential (primary) hypertension: Secondary | ICD-10-CM | POA: Diagnosis not present

## 2014-09-26 DIAGNOSIS — IMO0002 Reserved for concepts with insufficient information to code with codable children: Secondary | ICD-10-CM | POA: Diagnosis not present

## 2014-09-26 DIAGNOSIS — Z7901 Long term (current) use of anticoagulants: Secondary | ICD-10-CM | POA: Diagnosis not present

## 2014-09-26 DIAGNOSIS — F17200 Nicotine dependence, unspecified, uncomplicated: Secondary | ICD-10-CM | POA: Diagnosis not present

## 2014-09-26 LAB — POCT INR: INR: 2.4

## 2014-09-26 NOTE — Patient Instructions (Signed)
Patient instructed to take medications as defined in the Anti-coagulation Track section of this encounter.  Patient instructed to take today's dose.  Patient verbalized understanding of these instructions.    

## 2014-09-26 NOTE — Progress Notes (Signed)
Anti-Coagulation Progress Note  Alisha [name] is a 53 y.o. female who is currently on an anti-coagulation regimen.    RECENT RESULTS: Recent results are below, the most recent result is correlated with a dose of 75 mg. per week: Lab Results  Component Value Date   INR 2.40 09/26/2014   INR 1.3 09/12/2014   INR 2.87* 08/28/2014    ANTI-COAG DOSE: Anticoagulation Dose Instructions as of 09/26/2014     Sun Mon Tue Wed Thu Fri Sat   New Dose 11.25 mg 7.5 mg 11.25 mg 11.25 mg 11.25 mg 7.5 mg 15 mg       ANTICOAG SUMMARY: Anticoagulation Episode Summary   Current INR goal 2.0-3.0  Next INR check 10/24/2014  INR from last check 2.40 (09/26/2014)  Weekly max dose   Target end date   [facility]  Preferred lab   Send INR reminders to ANTICOAG IMP   Indications  DVT [453.40] Long term current use of anticoagulant [V58.61]        Comments       Anticoagulation Care Providers   Provider Role Specialty Phone number   Burman Freestone, MD  Internal Medicine [phone]      ANTICOAG TODAY: Anticoagulation Summary as of 09/26/2014   INR goal 2.0-3.0  Selected INR 2.40 (09/26/2014)  Next INR check 10/24/2014  Target end date    Indications  DVT [453.40] Long term current use of anticoagulant [V58.61]      Anticoagulation Episode Summary   [facility] check [facility]   Preferred lab    Send INR reminders to ANTICOAG IMP   Comments     Anticoagulation Care Providers   Provider Role Specialty Phone number   Burman Freestone, MD  Internal Medicine [phone]      PATIENT INSTRUCTIONS: Patient Instructions  Patient instructed to take medications as defined in the Anti-coagulation Track section of this encounter.  Patient instructed to take today's dose.  Patient verbalized understanding of these instructions.         FOLLOW-UP Return in 4 weeks (on 10/24/2014) for Follow-up at 1100.   Theron Arista, PharmD Clinical Pharmacist  - Resident Pager: [phone] 9/28/201511:59 AM

## 2014-09-28 NOTE — Progress Notes (Signed)
Indication: Recurrent deep venous thrombosis. Duration: Lifelong. INR: At target. Agree with Dr. Kristine Royal assessment and plan.

## 2014-09-29 ENCOUNTER — Encounter: Payer: Medicare Other | Admitting: Internal Medicine

## 2014-10-13 ENCOUNTER — Encounter: Payer: Medicare Other | Admitting: Internal Medicine

## 2014-10-13 ENCOUNTER — Encounter: Payer: Self-pay | Admitting: Internal Medicine

## 2014-10-24 ENCOUNTER — Ambulatory Visit (INDEPENDENT_AMBULATORY_CARE_PROVIDER_SITE_OTHER): Payer: Medicare Other | Admitting: Pharmacist

## 2014-10-24 ENCOUNTER — Telehealth: Payer: Self-pay | Admitting: *Deleted

## 2014-10-24 DIAGNOSIS — Z7901 Long term (current) use of anticoagulants: Secondary | ICD-10-CM

## 2014-10-24 LAB — POCT INR: INR: 1.2

## 2014-10-24 NOTE — Patient Instructions (Signed)
Patient instructed to take medications as defined in the Anti-coagulation Track section of this encounter.  Patient instructed to take today's dose. Patient instructed to NOT CHANGE HER dosing without prior consent of anticoagulation management clinic provider or her primary care physician.  Patient instructed to come to the ED or IM OPC in the event of bleeding.  Patient verbalized understanding of these instructions.

## 2014-10-24 NOTE — Progress Notes (Signed)
Indication: Recurrent deep venous thrombosis. Duration: Lifelong. INR: Below target. Agree with Dr. Gladstone Pih assessment and plan.

## 2014-10-24 NOTE — Telephone Encounter (Signed)
Pt comes in for coumadin clinic appt this am notably sedated, she drifts off during mid sentence, has food in her mouth and is hard to understand, she is offered to be seen in ED and refuses, made an appt in clinic

## 2014-10-24 NOTE — Progress Notes (Signed)
Anti-Coagulation Progress Note  Alisha [name] is a 53 y.o. female who is currently on an anti-coagulation regimen.    RECENT RESULTS: Recent results are below, the most recent result is correlated with a dose of 67.5 mg. per week: Lab Results  Component Value Date   INR 1.20 10/24/2014   INR 2.40 09/26/2014   INR 1.3 09/12/2014    ANTI-COAG DOSE: Anticoagulation Dose Instructions as of 10/24/2014     Sun Mon Tue Wed Thu Fri Sat   New Dose 11.25 mg 7.5 mg 11.25 mg 7.5 mg 11.25 mg 7.5 mg 15 mg       ANTICOAG SUMMARY: Anticoagulation Episode Summary   Current INR goal 2.0-3.0  Next INR check 10/31/2014  INR from last check 1.20! (10/24/2014)  Weekly max dose   Target end date   [facility]  Preferred lab   Send INR reminders to ANTICOAG IMP   Indications  DVT [I82.409] Long term current use of anticoagulant [Z79.01]        Comments       Anticoagulation Care Providers   Provider Role Specialty Phone number   Burman Freestone, MD  Internal Medicine [phone]      ANTICOAG TODAY: Anticoagulation Summary as of 10/24/2014   INR goal 2.0-3.0  Selected INR 1.20! (10/24/2014)  Next INR check 10/31/2014  Target end date    Indications  DVT [I82.409] Long term current use of anticoagulant [Z79.01]      Anticoagulation Episode Summary   [facility] check [facility]   Preferred lab    Send INR reminders to ANTICOAG IMP   Comments     Anticoagulation Care Providers   Provider Role Specialty Phone number   Burman Freestone, MD  Internal Medicine [phone]      PATIENT INSTRUCTIONS: Patient Instructions  Patient instructed to take medications as defined in the Anti-coagulation Track section of this encounter.  Patient instructed to take today's dose. Patient instructed to NOT CHANGE HER dosing without prior consent of anticoagulation management clinic provider or her primary care physician.  Patient instructed to come to the ED or  IM OPC in the event of bleeding.  Patient verbalized understanding of these instructions.       FOLLOW-UP Return in 7 days (on 10/31/2014) for Follow up INR at 1030h.  Jorene Guest, III Pharm.D., CACP

## 2014-10-25 ENCOUNTER — Ambulatory Visit: Payer: Medicare Other | Admitting: Internal Medicine

## 2014-10-31 ENCOUNTER — Encounter (HOSPITAL_COMMUNITY): Payer: Self-pay | Admitting: Emergency Medicine

## 2014-10-31 ENCOUNTER — Ambulatory Visit (INDEPENDENT_AMBULATORY_CARE_PROVIDER_SITE_OTHER): Payer: Medicare Other | Admitting: Pharmacist

## 2014-10-31 DIAGNOSIS — Z7901 Long term (current) use of anticoagulants: Secondary | ICD-10-CM

## 2014-10-31 LAB — POCT INR: INR: 2.4

## 2014-10-31 NOTE — Patient Instructions (Signed)
Patient instructed to take medications as defined in the Anti-coagulation Track section of this encounter.  Patient instructed to take today's dose.  Patient verbalized understanding of these instructions.    

## 2014-10-31 NOTE — Progress Notes (Signed)
Antiollow up INR at 1045h-Coagulation Progress Note  Alisha [name] is a 53 y.o. female who is currently on an anti-coagulation regimen.    RECENT RESULTS: Recent results are below, the most recent result is correlated with a dose of 67.5 mg. per week:  Lab Results  Component Value Date   INR 2.4 10/31/2014   INR 1.20 10/24/2014   INR 2.40 09/26/2014    ANTI-COAG DOSE: Anticoagulation Dose Instructions as of 10/31/2014      Dorene Grebe Tue Wed Thu Fri Sat   New Dose 0 mg 0 mg 11.25 mg 7.5 mg 11.25 mg 7.5 mg 0 mg    Description        The patient is using 7.5mg  strength warfarin tablets and is taking 1&1/2 x 7.5mg  (11.25mg ) on Su/Tu/Th/Sa; and 1x7.5mg  (7.5mg ) on M/W/F for total of 67.5mg /week. The newest iteration of EPIC 2014 has lost some formatting that would permit me to document the "Pill size" to capture the total daily dose of warfarin.        ANTICOAG SUMMARY: Anticoagulation Episode Summary    Current INR goal 2.0-3.0  Next INR check 11/28/2014  INR from last check 2.4 (10/31/2014)  Weekly max dose   Target end date   [facility]  Preferred lab   Send INR reminders to ANTICOAG IMP   Indications  DVT [I82.409] Long term current use of anticoagulant [Z79.01]        Comments       Anticoagulation Care Providers    Provider Role Specialty Phone number   Burman Freestone, MD  Internal Medicine [phone]      ANTICOAG TODAY: Anticoagulation Summary as of 10/31/2014    INR goal 2.0-3.0  Selected INR 2.4 (10/31/2014)  Next INR check 11/28/2014  Target end date    Indications  DVT [I82.409] Long term current use of anticoagulant [Z79.01]      Anticoagulation Episode Summary    [facility] check [facility]   Preferred lab    Send INR reminders to ANTICOAG IMP   Comments     Anticoagulation Care Providers    Provider Role Specialty Phone number   Burman Freestone, MD  Internal Medicine [phone]      PATIENT  INSTRUCTIONS: Patient Instructions  Patient instructed to take medications as defined in the Anti-coagulation Track section of this encounter.  Patient instructed to take today's dose.  Patient verbalized understanding of these instructions.       FOLLOW-UP Return in 4 weeks (on 11/28/2014) for Follow up INR at 1045h.  Jorene Guest, III Pharm.D., CACP

## 2014-11-10 DIAGNOSIS — F333 Major depressive disorder, recurrent, severe with psychotic symptoms: Secondary | ICD-10-CM | POA: Diagnosis not present

## 2014-11-10 DIAGNOSIS — Z23 Encounter for immunization: Secondary | ICD-10-CM | POA: Diagnosis not present

## 2014-11-28 ENCOUNTER — Ambulatory Visit (INDEPENDENT_AMBULATORY_CARE_PROVIDER_SITE_OTHER): Payer: Medicare Other | Admitting: Pharmacist

## 2014-11-28 DIAGNOSIS — F333 Major depressive disorder, recurrent, severe with psychotic symptoms: Secondary | ICD-10-CM | POA: Diagnosis not present

## 2014-11-28 DIAGNOSIS — Z7901 Long term (current) use of anticoagulants: Secondary | ICD-10-CM | POA: Diagnosis not present

## 2014-11-28 LAB — POCT INR: INR: 2

## 2014-11-28 NOTE — Patient Instructions (Signed)
Patient instructed to take medications as defined in the Anti-coagulation Track section of this encounter.  Patient instructed to take today's dose.  Patient verbalized understanding of these instructions.    

## 2014-11-28 NOTE — Progress Notes (Signed)
Anti-Coagulation Progress Note  Alisha [name] is a 53 y.o. female who is currently on an anti-coagulation regimen.    RECENT RESULTS: Recent results are below, the most recent result is correlated with a dose of 67.5 mg. per week: Lab Results  Component Value Date   INR 2.00 11/28/2014   INR 2.4 10/31/2014   INR 1.20 10/24/2014    ANTI-COAG DOSE: Anticoagulation Dose Instructions as of 11/28/2014      Dorene Grebe Tue Wed Thu Fri Sat   New Dose 11.25 mg 11.25 mg 11.25 mg 7.5 mg 11.25 mg 7.5 mg 11.25 mg       ANTICOAG SUMMARY: Anticoagulation Episode Summary    Current INR goal 2.0-3.0  Next INR check 12/26/2014  INR from last check 2.00 (11/28/2014)  Weekly max dose   Target end date   [facility]R check location [facility]  Preferred lab   Send INR reminders to ANTICOAG IMP   Indications  DVT [I82.409] Long term current use of anticoagulant [Z79.01]        Comments       Anticoagulation Care Providers    Provider Role Specialty Phone number   Burman Freestone, MD  Internal Medicine [phone]      ANTICOAG TODAY: Anticoagulation Summary as of 11/28/2014    INR goal 2.0-3.0  Selected INR 2.00 (11/28/2014)  Next INR check 12/26/2014  Target end date    Indications  DVT [I82.409] Long term current use of anticoagulant [Z79.01]      Anticoagulation Episode Summary    [facility]R check location [facility]   Preferred lab    Send INR reminders to ANTICOAG IMP   Comments     Anticoagulation Care Providers    Provider Role Specialty Phone number   Burman Freestone, MD  Internal Medicine [phone]      PATIENT INSTRUCTIONS: Patient Instructions  Patient instructed to take medications as defined in the Anti-coagulation Track section of this encounter.  Patient instructed to take today's dose.  Patient verbalized understanding of these instructions.       FOLLOW-UP Return in 4 weeks (on 12/26/2014) for Follow up INR at 1030h.  Jorene Guest, III Pharm.D.,  CACP

## 2014-12-07 ENCOUNTER — Encounter: Payer: Medicare Other | Admitting: Internal Medicine

## 2014-12-08 ENCOUNTER — Encounter (HOSPITAL_COMMUNITY): Payer: Self-pay | Admitting: Cardiology

## 2014-12-26 ENCOUNTER — Ambulatory Visit (INDEPENDENT_AMBULATORY_CARE_PROVIDER_SITE_OTHER): Payer: Medicare Other | Admitting: Pharmacist

## 2014-12-26 DIAGNOSIS — I82409 Acute embolism and thrombosis of unspecified deep veins of unspecified lower extremity: Secondary | ICD-10-CM

## 2014-12-26 DIAGNOSIS — Z7901 Long term (current) use of anticoagulants: Secondary | ICD-10-CM

## 2014-12-26 LAB — POCT INR: INR: 3.5

## 2014-12-26 NOTE — Progress Notes (Signed)
Anti-Coagulation Progress Note  Alisha [name] is a 53 y.o. female who reports to the clinic for monitoring of anticoagulation treatment.    RECENT RESULTS: Recent results are below, the most recent result is correlated with a dose of 71.25 mg. per week: Lab Results  Component Value Date   INR 3.5 12/26/2014   INR 2.00 11/28/2014   INR 2.4 10/31/2014    Weekly dose was decreased by 5% to 67.5 mg per week  ANTI-COAG DOSE: INR as of 12/26/2014 and Previous Dosing Information    INR Dt INR Goal [employer] Sun Mon Tue Wed Thu Fri Sat   12/26/2014 3.5 2.0-3.0 71.25 mg 11.25 mg 11.25 mg 11.25 mg 7.5 mg 11.25 mg 7.5 mg 11.25 mg    Anticoagulation Dose Instructions as of 12/26/2014      Total Sun Mon Tue Wed Thu Fri Sat   New Dose 67.5 mg 11.25 mg 7.5 mg 11.25 mg 7.5 mg 11.25 mg 7.5 mg 11.25 mg     (7.5 mg x 1.5)  (7.5 mg x 1)  (7.5 mg x 1.5)  (7.5 mg x 1)  (7.5 mg x 1.5)  (7.5 mg x 1)  (7.5 mg x 1.5)                           ANTICOAG SUMMARY: Anticoagulation Episode Summary    Current INR goal 2.0-3.0  Next INR check 01/09/2015  INR from last check 3.5! (12/26/2014)  Weekly max dose   Target end date   [facility] check [facility]  Preferred lab   Send INR reminders to ANTICOAG IMP   Indications  DVT [I82.409] Long term current use of anticoagulant [Z79.01]        Comments       Anticoagulation Care Providers    Provider Role Specialty Phone number   Burman Freestone, MD  Internal Medicine [phone]     PATIENT INSTRUCTIONS: Patient Instructions  Patient instructed to take medications as defined in the Anti-coagulation Track section of this encounter.  Patient instructed to take 1 tablet today instead of 1 and 1/2.  Patient verbalized understanding of these instructions.       FOLLOW-UP Return in about 2 weeks (around 01/09/2015) for Follow up INR on 01/09/15 at 1030.  Flossie Dibble, PharmD BCPS, BCACP

## 2014-12-26 NOTE — Patient Instructions (Signed)
Patient instructed to take medications as defined in the Anti-coagulation Track section of this encounter.  Patient instructed to take 1 tablet today instead of 1 and 1/2.  Patient verbalized understanding of these instructions.

## 2015-01-02 ENCOUNTER — Other Ambulatory Visit: Payer: Self-pay | Admitting: *Deleted

## 2015-01-02 MED ORDER — GABAPENTIN 800 MG PO TABS: ORAL_TABLET | ORAL | Status: DC

## 2015-01-09 ENCOUNTER — Ambulatory Visit (INDEPENDENT_AMBULATORY_CARE_PROVIDER_SITE_OTHER): Payer: Medicare Other | Admitting: Pharmacist

## 2015-01-09 DIAGNOSIS — I82409 Acute embolism and thrombosis of unspecified deep veins of unspecified lower extremity: Secondary | ICD-10-CM | POA: Diagnosis not present

## 2015-01-09 DIAGNOSIS — Z7901 Long term (current) use of anticoagulants: Secondary | ICD-10-CM

## 2015-01-09 LAB — POCT INR: INR: 2.4

## 2015-01-09 NOTE — Patient Instructions (Signed)
Patient instructed to take medications as defined in the Anti-coagulation Track section of this encounter.  Patient instructed to take today's dose.  Patient verbalized understanding of these instructions.    

## 2015-01-09 NOTE — Progress Notes (Signed)
Indication: Recurrent deep venous thrombosis. Duration: Lifelong. INR: At target. Agree with Dr. Gladstone Pih assessment and plan.

## 2015-01-09 NOTE — Progress Notes (Signed)
Anti-Coagulation Progress Note  Alisha [name] is a 54 y.o. female who is currently on an anti-coagulation regimen.    RECENT RESULTS: Recent results are below, the most recent result is correlated with a dose of 67.5 mg. per week: Lab Results  Component Value Date   INR 2.40 01/09/2015   INR 3.5 12/26/2014   INR 2.00 11/28/2014    ANTI-COAG DOSE: Anticoagulation Dose Instructions as of 01/09/2015      Sun Mon Tue Wed Thu Fri Sat   New Dose 11.25 mg 7.5 mg 11.25 mg 7.5 mg 11.25 mg 7.5 mg 11.25 mg       ANTICOAG SUMMARY: Anticoagulation Episode Summary    Current INR goal 2.0-3.0  Next INR check 01/30/2015  INR from last check 2.40 (01/09/2015)  Weekly max dose   Target end date   [facility] check [facility]  Preferred lab   Send INR reminders to ANTICOAG IMP   Indications  DVT [I82.409] Long term current use of anticoagulant [Z79.01]        Comments       Anticoagulation Care Providers    Provider Role Specialty Phone number   Burman Freestone, MD  Internal Medicine [phone]      ANTICOAG TODAY: Anticoagulation Summary as of 01/09/2015    INR goal 2.0-3.0  Selected INR 2.40 (01/09/2015)  Next INR check 01/30/2015  Target end date    Indications  DVT [I82.409] Long term current use of anticoagulant [Z79.01]      Anticoagulation Episode Summary    [facility] check [facility]   Preferred lab    Send INR reminders to ANTICOAG IMP   Comments     Anticoagulation Care Providers    Provider Role Specialty Phone number   Burman Freestone, MD  Internal Medicine [phone]      PATIENT INSTRUCTIONS: Patient Instructions  Patient instructed to take medications as defined in the Anti-coagulation Track section of this encounter.  Patient instructed to take today's dose.  Patient verbalized understanding of these instructions.       FOLLOW-UP Return in 3 weeks (on 01/30/2015) for Follow up INR at 0915h.  Jorene Guest, III Pharm.D., CACP

## 2015-01-30 ENCOUNTER — Ambulatory Visit (INDEPENDENT_AMBULATORY_CARE_PROVIDER_SITE_OTHER): Payer: Medicare Other | Admitting: Pharmacist

## 2015-01-30 DIAGNOSIS — Z7901 Long term (current) use of anticoagulants: Secondary | ICD-10-CM

## 2015-01-30 LAB — POCT INR: INR: 3.1

## 2015-01-30 NOTE — Progress Notes (Signed)
Anti-Coagulation Progress Note  Alisha [name] is a 54 y.o. female who is currently on an anti-coagulation regimen.    RECENT RESULTS: Recent results are below, the most recent result is correlated with a dose of 67.5 mg. per week: Lab Results  Component Value Date   INR 3.10 01/30/2015   INR 2.40 01/09/2015   INR 3.5 12/26/2014    ANTI-COAG DOSE: Anticoagulation Dose Instructions as of 01/30/2015      Dorene Grebe Tue Wed Thu Fri Sat   New Dose 7.5 mg 11.25 mg 7.5 mg 11.25 mg 7.5 mg 11.25 mg 7.5 mg       ANTICOAG SUMMARY: Anticoagulation Episode Summary    Current INR goal 2.0-3.0  Next INR check 02/27/2015  INR from last check 3.10! (01/30/2015)  Weekly max dose   Target end date   [facility] check [facility]  Preferred lab   Send INR reminders to ANTICOAG IMP   Indications  DVT [I82.409] Long term current use of anticoagulant [Z79.01]        Comments       Anticoagulation Care Providers    Provider Role Specialty Phone number   Burman Freestone, MD  Internal Medicine [phone]      ANTICOAG TODAY: Anticoagulation Summary as of 01/30/2015    INR goal 2.0-3.0  Selected INR 3.10! (01/30/2015)  Next INR check 02/27/2015  Target end date    Indications  DVT [I82.409] Long term current use of anticoagulant [Z79.01]      Anticoagulation Episode Summary    [facility] check location [facility]   Preferred lab    Send INR reminders to ANTICOAG IMP   Comments     Anticoagulation Care Providers    Provider Role Specialty Phone number   Burman Freestone, MD  Internal Medicine [phone]      PATIENT INSTRUCTIONS: Patient Instructions  Patient instructed to take medications as defined in the Anti-coagulation Track section of this encounter.  Patient instructed to take today's dose.  Patient verbalized understanding of these instructions.      FOLLOW-UP Return in 4 weeks (on 02/27/2015) for Follow up INR at 0930h.  Jorene Guest, III Pharm.D., CACP

## 2015-01-30 NOTE — Patient Instructions (Signed)
Patient instructed to take medications as defined in the Anti-coagulation Track section of this encounter.  Patient instructed to take today's dose.  Patient verbalized understanding of these instructions.    

## 2015-01-31 ENCOUNTER — Telehealth: Payer: Self-pay | Admitting: Internal Medicine

## 2015-01-31 NOTE — Telephone Encounter (Signed)
Call to patient to confirm appointment for 02/01/15 at 3:15. Mailbox full on one and other number is invalid

## 2015-02-01 ENCOUNTER — Encounter: Payer: Medicare Other | Admitting: Internal Medicine

## 2015-02-01 ENCOUNTER — Encounter: Payer: Self-pay | Admitting: Internal Medicine

## 2015-02-06 ENCOUNTER — Emergency Department (HOSPITAL_COMMUNITY): Payer: Medicare Other

## 2015-02-06 ENCOUNTER — Inpatient Hospital Stay (HOSPITAL_COMMUNITY)
Admission: EM | Admit: 2015-02-06 | Discharge: 2015-02-09 | DRG: 345 | Discharge status: Against Medical Advice | Payer: Medicare Other | Attending: Internal Medicine | Admitting: Internal Medicine

## 2015-02-06 ENCOUNTER — Encounter (HOSPITAL_COMMUNITY): Payer: Self-pay | Admitting: Family Medicine

## 2015-02-06 DIAGNOSIS — F419 Anxiety disorder, unspecified: Secondary | ICD-10-CM | POA: Diagnosis present

## 2015-02-06 DIAGNOSIS — F17210 Nicotine dependence, cigarettes, uncomplicated: Secondary | ICD-10-CM | POA: Diagnosis present

## 2015-02-06 DIAGNOSIS — I10 Essential (primary) hypertension: Secondary | ICD-10-CM | POA: Diagnosis present

## 2015-02-06 DIAGNOSIS — I509 Heart failure, unspecified: Secondary | ICD-10-CM | POA: Diagnosis present

## 2015-02-06 DIAGNOSIS — N926 Irregular menstruation, unspecified: Secondary | ICD-10-CM | POA: Diagnosis present

## 2015-02-06 DIAGNOSIS — I2510 Atherosclerotic heart disease of native coronary artery without angina pectoris: Secondary | ICD-10-CM | POA: Diagnosis present

## 2015-02-06 DIAGNOSIS — J45909 Unspecified asthma, uncomplicated: Secondary | ICD-10-CM | POA: Diagnosis present

## 2015-02-06 DIAGNOSIS — R52 Pain, unspecified: Secondary | ICD-10-CM

## 2015-02-06 DIAGNOSIS — R109 Unspecified abdominal pain: Secondary | ICD-10-CM | POA: Diagnosis not present

## 2015-02-06 DIAGNOSIS — I739 Peripheral vascular disease, unspecified: Secondary | ICD-10-CM | POA: Diagnosis present

## 2015-02-06 DIAGNOSIS — Z7982 Long term (current) use of aspirin: Secondary | ICD-10-CM

## 2015-02-06 DIAGNOSIS — N390 Urinary tract infection, site not specified: Secondary | ICD-10-CM | POA: Diagnosis present

## 2015-02-06 DIAGNOSIS — K5780 Diverticulitis of intestine, part unspecified, with perforation and abscess without bleeding: Secondary | ICD-10-CM | POA: Diagnosis not present

## 2015-02-06 DIAGNOSIS — G2581 Restless legs syndrome: Secondary | ICD-10-CM | POA: Diagnosis present

## 2015-02-06 DIAGNOSIS — K5700 Diverticulitis of small intestine with perforation and abscess without bleeding: Secondary | ICD-10-CM | POA: Diagnosis not present

## 2015-02-06 DIAGNOSIS — K5792 Diverticulitis of intestine, part unspecified, without perforation or abscess without bleeding: Secondary | ICD-10-CM | POA: Diagnosis present

## 2015-02-06 DIAGNOSIS — Z888 Allergy status to other drugs, medicaments and biological substances status: Secondary | ICD-10-CM | POA: Diagnosis not present

## 2015-02-06 DIAGNOSIS — N3946 Mixed incontinence: Secondary | ICD-10-CM | POA: Diagnosis present

## 2015-02-06 DIAGNOSIS — E785 Hyperlipidemia, unspecified: Secondary | ICD-10-CM | POA: Diagnosis present

## 2015-02-06 DIAGNOSIS — R509 Fever, unspecified: Secondary | ICD-10-CM | POA: Diagnosis not present

## 2015-02-06 DIAGNOSIS — Z7901 Long term (current) use of anticoagulants: Secondary | ICD-10-CM | POA: Diagnosis not present

## 2015-02-06 DIAGNOSIS — K5720 Diverticulitis of large intestine with perforation and abscess without bleeding: Principal | ICD-10-CM | POA: Diagnosis present

## 2015-02-06 DIAGNOSIS — Z8719 Personal history of other diseases of the digestive system: Secondary | ICD-10-CM | POA: Diagnosis not present

## 2015-02-06 DIAGNOSIS — F329 Major depressive disorder, single episode, unspecified: Secondary | ICD-10-CM | POA: Diagnosis present

## 2015-02-06 DIAGNOSIS — K5732 Diverticulitis of large intestine without perforation or abscess without bleeding: Secondary | ICD-10-CM

## 2015-02-06 DIAGNOSIS — M179 Osteoarthritis of knee, unspecified: Secondary | ICD-10-CM | POA: Diagnosis present

## 2015-02-06 DIAGNOSIS — M545 Low back pain: Secondary | ICD-10-CM | POA: Diagnosis not present

## 2015-02-06 DIAGNOSIS — G894 Chronic pain syndrome: Secondary | ICD-10-CM | POA: Diagnosis present

## 2015-02-06 DIAGNOSIS — Z86718 Personal history of other venous thrombosis and embolism: Secondary | ICD-10-CM

## 2015-02-06 DIAGNOSIS — Z95818 Presence of other cardiac implants and grafts: Secondary | ICD-10-CM | POA: Diagnosis not present

## 2015-02-06 DIAGNOSIS — K429 Umbilical hernia without obstruction or gangrene: Secondary | ICD-10-CM | POA: Diagnosis not present

## 2015-02-06 DIAGNOSIS — R110 Nausea: Secondary | ICD-10-CM | POA: Diagnosis not present

## 2015-02-06 DIAGNOSIS — B9689 Other specified bacterial agents as the cause of diseases classified elsewhere: Secondary | ICD-10-CM | POA: Diagnosis not present

## 2015-02-06 LAB — URINE MICROSCOPIC-ADD ON

## 2015-02-06 LAB — URINALYSIS, ROUTINE W REFLEX MICROSCOPIC
GLUCOSE, UA: NEGATIVE mg/dL
Hgb urine dipstick: NEGATIVE
Ketones, ur: 40 mg/dL — AB
Nitrite: NEGATIVE
PH: 6 (ref 5.0–8.0)
Protein, ur: >300 mg/dL — AB
SPECIFIC GRAVITY, URINE: 1.04 — AB (ref 1.005–1.030)
UROBILINOGEN UA: 1 mg/dL (ref 0.0–1.0)

## 2015-02-06 LAB — CBC WITH DIFFERENTIAL/PLATELET
BASOS ABS: 0 10*3/uL (ref 0.0–0.1)
Basophils Relative: 0 % (ref 0–1)
EOS ABS: 0.1 10*3/uL (ref 0.0–0.7)
Eosinophils Relative: 0 % (ref 0–5)
HEMATOCRIT: 41.3 % (ref 36.0–46.0)
HEMOGLOBIN: 14.4 g/dL (ref 12.0–15.0)
LYMPHS ABS: 1.8 10*3/uL (ref 0.7–4.0)
Lymphocytes Relative: 11 % — ABNORMAL LOW (ref 12–46)
MCH: 33 pg (ref 26.0–34.0)
MCHC: 34.9 g/dL (ref 30.0–36.0)
MCV: 94.5 fL (ref 78.0–100.0)
MONO ABS: 1.6 10*3/uL — AB (ref 0.1–1.0)
Monocytes Relative: 10 % (ref 3–12)
NEUTROS ABS: 13.1 10*3/uL — AB (ref 1.7–7.7)
Neutrophils Relative %: 79 % — ABNORMAL HIGH (ref 43–77)
Platelets: 212 10*3/uL (ref 150–400)
RBC: 4.37 MIL/uL (ref 3.87–5.11)
RDW: 12.6 % (ref 11.5–15.5)
WBC: 16.6 10*3/uL — ABNORMAL HIGH (ref 4.0–10.5)

## 2015-02-06 LAB — COMPREHENSIVE METABOLIC PANEL
ALT: 16 U/L (ref 0–35)
AST: 20 U/L (ref 0–37)
Albumin: 3.1 g/dL — ABNORMAL LOW (ref 3.5–5.2)
Alkaline Phosphatase: 72 U/L (ref 39–117)
Anion gap: 13 (ref 5–15)
BUN: 9 mg/dL (ref 6–23)
CO2: 21 mmol/L (ref 19–32)
Calcium: 9 mg/dL (ref 8.4–10.5)
Chloride: 104 mmol/L (ref 96–112)
Creatinine, Ser: 0.75 mg/dL (ref 0.50–1.10)
GFR calc Af Amer: >90 mL/min (ref >90)
GFR calc non Af Amer: >90 mL/min (ref >90)
Glucose, Bld: 109 mg/dL — ABNORMAL HIGH (ref 70–99)
Potassium: 3.9 mmol/L (ref 3.5–5.1)
Sodium: 138 mmol/L (ref 135–145)
TOTAL PROTEIN: 7.2 g/dL (ref 6.0–8.3)
Total Bilirubin: 1.5 mg/dL — ABNORMAL HIGH (ref 0.3–1.2)

## 2015-02-06 LAB — LIPASE, BLOOD: LIPASE: 21 U/L (ref 11–59)

## 2015-02-06 LAB — POC URINE PREG, ED: PREG TEST UR: NEGATIVE

## 2015-02-06 LAB — PROTIME-INR
INR: 1.16 (ref 0.00–1.49)
Prothrombin Time: 14.9 seconds (ref 11.6–15.2)

## 2015-02-06 MED ORDER — DEXTROSE 5 % IV SOLN
1.0000 g | Freq: Once | INTRAVENOUS | Status: AC
  Administered 2015-02-06: 1 g via INTRAVENOUS
  Filled 2015-02-06: qty 10

## 2015-02-06 MED ORDER — MORPHINE SULFATE 4 MG/ML IJ SOLN
4.0000 mg | Freq: Once | INTRAMUSCULAR | Status: AC
  Administered 2015-02-06: 4 mg via INTRAVENOUS
  Filled 2015-02-06: qty 1

## 2015-02-06 MED ORDER — HYDROXYZINE HCL 25 MG PO TABS: 25.0000 mg | ORAL_TABLET | Freq: Three times a day (TID) | ORAL | Status: DC | PRN

## 2015-02-06 MED ORDER — POTASSIUM CHLORIDE ER 10 MEQ PO TBCR
10.0000 meq | EXTENDED_RELEASE_TABLET | Freq: Every day | ORAL | Status: DC
  Administered 2015-02-06 – 2015-02-09 (×4): 10 meq via ORAL
  Filled 2015-02-06 (×4): qty 1

## 2015-02-06 MED ORDER — DULOXETINE HCL 60 MG PO CPEP
60.0000 mg | ORAL_CAPSULE | Freq: Every day | ORAL | Status: DC
  Administered 2015-02-06 – 2015-02-09 (×4): 60 mg via ORAL
  Filled 2015-02-06 (×4): qty 1

## 2015-02-06 MED ORDER — HYDROCHLOROTHIAZIDE 12.5 MG PO CAPS
12.5000 mg | ORAL_CAPSULE | Freq: Every day | ORAL | Status: DC
  Administered 2015-02-06 – 2015-02-09 (×4): 12.5 mg via ORAL
  Filled 2015-02-06 (×4): qty 1

## 2015-02-06 MED ORDER — CIPROFLOXACIN IN D5W 400 MG/200ML IV SOLN
400.0000 mg | Freq: Two times a day (BID) | INTRAVENOUS | Status: DC
  Administered 2015-02-07 – 2015-02-09 (×5): 400 mg via INTRAVENOUS
  Filled 2015-02-06 (×8): qty 200

## 2015-02-06 MED ORDER — METRONIDAZOLE IN NACL 5-0.79 MG/ML-% IV SOLN
500.0000 mg | Freq: Once | INTRAVENOUS | Status: AC
  Administered 2015-02-06: 500 mg via INTRAVENOUS
  Filled 2015-02-06 (×2): qty 100

## 2015-02-06 MED ORDER — METRONIDAZOLE IN NACL 5-0.79 MG/ML-% IV SOLN
500.0000 mg | Freq: Three times a day (TID) | INTRAVENOUS | Status: DC
  Administered 2015-02-07 – 2015-02-09 (×8): 500 mg via INTRAVENOUS
  Filled 2015-02-06 (×11): qty 100

## 2015-02-06 MED ORDER — IOHEXOL 350 MG/ML SOLN
100.0000 mL | Freq: Once | INTRAVENOUS | Status: AC | PRN
  Administered 2015-02-06: 100 mL via INTRAVENOUS

## 2015-02-06 MED ORDER — CIPROFLOXACIN IN D5W 400 MG/200ML IV SOLN
400.0000 mg | Freq: Once | INTRAVENOUS | Status: AC
  Administered 2015-02-06: 400 mg via INTRAVENOUS
  Filled 2015-02-06: qty 200

## 2015-02-06 MED ORDER — ONDANSETRON HCL 4 MG/2ML IJ SOLN
4.0000 mg | Freq: Once | INTRAMUSCULAR | Status: AC
  Administered 2015-02-06: 4 mg via INTRAVENOUS
  Filled 2015-02-06: qty 2

## 2015-02-06 MED ORDER — ZIPRASIDONE HCL 60 MG PO CAPS
60.0000 mg | ORAL_CAPSULE | Freq: Two times a day (BID) | ORAL | Status: DC
  Administered 2015-02-07 – 2015-02-08 (×3): 60 mg via ORAL
  Filled 2015-02-06 (×7): qty 1

## 2015-02-06 MED ORDER — HYDROMORPHONE HCL 1 MG/ML IJ SOLN
1.0000 mg | Freq: Once | INTRAMUSCULAR | Status: AC
  Administered 2015-02-06: 1 mg via INTRAVENOUS
  Filled 2015-02-06: qty 1

## 2015-02-06 MED ORDER — WARFARIN - PHARMACIST DOSING INPATIENT: Freq: Every day | Status: DC

## 2015-02-06 MED ORDER — HYDROMORPHONE HCL 1 MG/ML IJ SOLN
1.0000 mg | INTRAMUSCULAR | Status: DC | PRN
  Administered 2015-02-07 – 2015-02-09 (×8): 1 mg via INTRAVENOUS
  Filled 2015-02-06 (×7): qty 1

## 2015-02-06 MED ORDER — HEPARIN BOLUS VIA INFUSION
4000.0000 [IU] | Freq: Once | INTRAVENOUS | Status: AC
  Administered 2015-02-07: 4000 [IU] via INTRAVENOUS
  Filled 2015-02-06: qty 4000

## 2015-02-06 MED ORDER — LISINOPRIL 20 MG PO TABS
20.0000 mg | ORAL_TABLET | Freq: Two times a day (BID) | ORAL | Status: DC
  Administered 2015-02-07 – 2015-02-09 (×6): 20 mg via ORAL
  Filled 2015-02-06 (×7): qty 1

## 2015-02-06 MED ORDER — ATORVASTATIN CALCIUM 40 MG PO TABS
40.0000 mg | ORAL_TABLET | Freq: Every day | ORAL | Status: DC
  Administered 2015-02-07 – 2015-02-08 (×2): 40 mg via ORAL
  Filled 2015-02-06 (×5): qty 1

## 2015-02-06 MED ORDER — MORPHINE SULFATE 2 MG/ML IJ SOLN
1.0000 mg | INTRAMUSCULAR | Status: DC | PRN
  Administered 2015-02-06: 1 mg via INTRAVENOUS
  Filled 2015-02-06: qty 1

## 2015-02-06 MED ORDER — HEPARIN (PORCINE) IN NACL 100-0.45 UNIT/ML-% IJ SOLN
1650.0000 [IU]/h | INTRAMUSCULAR | Status: DC
  Administered 2015-02-07: 1050 [IU]/h via INTRAVENOUS
  Administered 2015-02-08: 1650 [IU]/h via INTRAVENOUS
  Filled 2015-02-06 (×6): qty 250

## 2015-02-06 MED ORDER — WARFARIN SODIUM 5 MG PO TABS
5.0000 mg | ORAL_TABLET | Freq: Once | ORAL | Status: AC
  Administered 2015-02-06: 5 mg via ORAL
  Filled 2015-02-06: qty 1

## 2015-02-06 MED ORDER — GABAPENTIN 400 MG PO CAPS
800.0000 mg | ORAL_CAPSULE | Freq: Two times a day (BID) | ORAL | Status: DC
  Administered 2015-02-07 – 2015-02-09 (×5): 800 mg via ORAL
  Filled 2015-02-06 (×8): qty 2

## 2015-02-06 MED ORDER — SODIUM CHLORIDE 0.9 % IV SOLN
INTRAVENOUS | Status: AC
  Administered 2015-02-06: 21:00:00 via INTRAVENOUS

## 2015-02-06 MED ORDER — CARVEDILOL 25 MG PO TABS
25.0000 mg | ORAL_TABLET | Freq: Two times a day (BID) | ORAL | Status: DC
  Administered 2015-02-07 – 2015-02-09 (×5): 25 mg via ORAL
  Filled 2015-02-06 (×8): qty 1

## 2015-02-06 MED ORDER — ASPIRIN EC 81 MG PO TBEC
81.0000 mg | DELAYED_RELEASE_TABLET | Freq: Every day | ORAL | Status: DC
  Administered 2015-02-06 – 2015-02-09 (×4): 81 mg via ORAL
  Filled 2015-02-06 (×4): qty 1

## 2015-02-06 MED ORDER — HYDROCHLOROTHIAZIDE 25 MG PO TABS: 12.5000 mg | ORAL_TABLET | Freq: Every day | ORAL | Status: DC

## 2015-02-06 MED ORDER — HYDROMORPHONE HCL 1 MG/ML IJ SOLN: 1.0000 mg | INTRAMUSCULAR | Status: DC | PRN

## 2015-02-06 MED ORDER — GABAPENTIN 800 MG PO TABS
800.0000 mg | ORAL_TABLET | Freq: Two times a day (BID) | ORAL | Status: DC
  Filled 2015-02-06: qty 1

## 2015-02-06 MED ORDER — ONDANSETRON HCL 4 MG/2ML IJ SOLN: 4.0000 mg | Freq: Three times a day (TID) | INTRAMUSCULAR | Status: AC | PRN

## 2015-02-06 MED ORDER — BUPROPION HCL ER (XL) 300 MG PO TB24
300.0000 mg | ORAL_TABLET | Freq: Every day | ORAL | Status: DC
  Administered 2015-02-06 – 2015-02-09 (×4): 300 mg via ORAL
  Filled 2015-02-06 (×4): qty 1

## 2015-02-06 NOTE — ED Notes (Signed)
Pt reports does not have Rheumatoid Arthritis but has been diagnosed with Degenerative Arthritis.

## 2015-02-06 NOTE — ED Notes (Signed)
Contacted Lab regarding delay in urinalysis result - states will be resulted in 5 minutes. Patient updated.

## 2015-02-06 NOTE — ED Notes (Signed)
Attempted report 

## 2015-02-06 NOTE — ED Provider Notes (Signed)
4:10 PM Patient signed out to me at change of shift by Domenic Moras, PA-C.  Pt p/w abdominal and back pain, dysuria, chronic leg pain.  Has hx PAD and is s/p fempop bypass.  No concerns on lower extremity exam.  Positive for UTI, abx given. Plan is for CT angio abd/pelvis with runoff.  If CT negative and patient's pain and nausea controlled, plan for d/c home with Keflex, symptomatic medications with PCP follow up.     CT shows diverticulitis with possible developing abscess.  Patient still complaining of severe pain.  Abdomen is soft, nondistended, diffusely tender without guarding or rebound.  Discussed all results and plan for admission with the patient.  She agrees.  I have ordered cipro and flagyl IV.    6:16 PM Received call from [facility] Resident who states the oncoming team will accept admission.  Gives okay for holding orders to be placed.  Will pass on information to oncoming team.    Results for orders placed or performed during the hospital encounter of 02/06/15  CBC with Differential/Platelet  Result Value Ref Range   WBC 16.6 (H) 4.0 - 10.5 K/uL   RBC 4.37 3.87 - 5.11 MIL/uL   Hemoglobin 14.4 12.0 - 15.0 g/dL   HCT 41.3 36.0 - 46.0 %   MCV 94.5 78.0 - 100.0 fL   MCH 33.0 26.0 - 34.0 pg   MCHC 34.9 30.0 - 36.0 g/dL   RDW 12.6 11.5 - 15.5 %   Platelets 212 150 - 400 K/uL   Neutrophils Relative % 79 (H) 43 - 77 %   Neutro Abs 13.1 (H) 1.7 - 7.7 K/uL   Lymphocytes Relative 11 (L) 12 - 46 %   Lymphs Abs 1.8 0.7 - 4.0 K/uL   Monocytes Relative 10 3 - 12 %   Monocytes Absolute 1.6 (H) 0.1 - 1.0 K/uL   Eosinophils Relative 0 0 - 5 %   Eosinophils Absolute 0.1 0.0 - 0.7 K/uL   Basophils Relative 0 0 - 1 %   Basophils Absolute 0.0 0.0 - 0.1 K/uL  Comprehensive metabolic panel  Result Value Ref Range   Sodium 138 135 - 145 mmol/L   Potassium 3.9 3.5 - 5.1 mmol/L   Chloride 104 96 - 112 mmol/L   CO2 21 19 - 32 mmol/L   Glucose, Bld 109 (H) 70 - 99 mg/dL   BUN 9  6 - 23 mg/dL   Creatinine, Ser 0.75 0.50 - 1.10 mg/dL   Calcium 9.0 8.4 - 10.5 mg/dL   Total Protein 7.2 6.0 - 8.3 g/dL   Albumin 3.1 (L) 3.5 - 5.2 g/dL   AST 20 0 - 37 U/L   ALT 16 0 - 35 U/L   Alkaline Phosphatase 72 39 - 117 U/L   Total Bilirubin 1.5 (H) 0.3 - 1.2 mg/dL   GFR calc non Af Amer >90 >90 mL/min   GFR calc Af Amer >90 >90 mL/min   Anion gap 13 5 - 15  Lipase, blood  Result Value Ref Range   Lipase 21 11 - 59 U/L  Urinalysis, Routine w reflex microscopic  Result Value Ref Range   Color, Urine AMBER (A) YELLOW   APPearance CLOUDY (A) CLEAR   Specific Gravity, Urine 1.040 (H) 1.005 - 1.030   pH 6.0 5.0 - 8.0   Glucose, UA NEGATIVE NEGATIVE mg/dL   Hgb urine dipstick NEGATIVE NEGATIVE   Bilirubin Urine LARGE (A) NEGATIVE   Ketones, ur 40 (A)  NEGATIVE mg/dL   Protein, ur >300 (A) NEGATIVE mg/dL   Urobilinogen, UA 1.0 0.0 - 1.0 mg/dL   Nitrite NEGATIVE NEGATIVE   Leukocytes, UA SMALL (A) NEGATIVE  Protime-INR  Result Value Ref Range   Prothrombin Time 14.9 11.6 - 15.2 seconds   INR 1.16 0.00 - 1.49  Urine microscopic-add on  Result Value Ref Range   Squamous Epithelial / LPF MANY (A) RARE   WBC, UA 11-20 <3 WBC/hpf   RBC / HPF 0-2 <3 RBC/hpf   Bacteria, UA MANY (A) RARE   Urine-Other MUCOUS PRESENT   POC urine preg, ED (not at [facility])  Result Value Ref Range   Preg Test, Ur NEGATIVE NEGATIVE   Ct Angio Ao+bifem W/cm &/or Wo/cm  02/06/2015   CLINICAL DATA:  Back pain, nausea and fever. History of peripheral vascular disease with known chronic SFA occlusions and right external iliac artery stenosis.  EXAM: CT ANGIOGRAPHY OF ABDOMINAL AORTA WITH ILIOFEMORAL RUNOFF  TECHNIQUE: Multidetector CT imaging of the abdomen, pelvis and lower extremities was performed using the standard protocol during bolus administration of intravenous contrast. Multiplanar CT image reconstructions and MIPs were obtained to evaluate the vascular anatomy.  CONTRAST:  159mL OMNIPAQUE IOHEXOL 350  MG/ML SOLN  COMPARISON:  10/03/2008  FINDINGS: Aorta: The abdominal aorta is normally patent and demonstrates mild plaque. No evidence of aortic aneurysm or stenosis. The celiac axis, superior mesenteric artery and inferior mesenteric artery show stable and normal patency. Midportion of the left renal artery demonstrates stable moderate focal stenosis of approximately 50% narrowing. Mild progression of plaque in the proximal right renal artery without significant stenosis identified.  Right Lower Extremity: Calcified plaque present in the common iliac artery without significant stenosis. Previously identified focal stenosis of the mid right external iliac artery has been stented with a widely patent stent present. The common femoral artery shows distal stenosis of approximately 50%. The profunda femoral artery is normally patent. The SFA is chronically occluded at its origin. Distal SFA is reconstituted by collaterals at the adductor hiatus. The popliteal artery is normally patent. Three-vessel runoff is demonstrated into the foot with dominant anterior tibial runoff.  Left Lower Extremity: Calcified plaque is present in the common and external iliac arteries without significant stenosis identified. Distal common femoral artery becomes of larger caliber, likely related to prior patch angioplasty. The native SFA is chronically occluded. The profunda femoral artery is open. The popliteal artery is reconstituted above the knee. Peroneal posterior tibial arteries are open into the foot. The anterior tibial artery is open but poorly opacified and diseased with calcified plaque present.  Arterial phase evaluation of the solid organs is unremarkable including the liver, gallbladder, pancreas, spleen, adrenal glands and kidneys.  There is thickening and inflammation involving the sigmoid colon. Lumen is difficult to follow in the pelvis and there may be some developing areas of abscess formation. Findings are most likely  consistent with diverticulitis. Mass cannot be excluded. Follow-up imaging with CT of the abdomen and pelvis during venous phase contrast opacification and with oral contrast may be helpful. No free intraperitoneal air is identified. No enlarged lymph nodes are seen.  Review of the MIP images confirms the above findings.  IMPRESSION: 1. Evidence of probable diverticulitis involving the sigmoid colon. There may be developing adjacent diverticular abscess is predominantly posterior and inferior to the sigmoid colon. This is difficult to delineate without oral contrast on poor and during the arterial phase CTA. Mass of the colon cannot be excluded on  the current study. Follow-up with venous phase CT of the abdomen and pelvis with oral contrast would be helpful. 2. 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 3. Chronic bilateral SFA occlusions.   Electronically Signed   By: Aletta Edouard M.D.   On: 02/06/2015 17:19      Clayton Bibles, PA-C 02/06/15 [id number]  Salem Alvino Chapel, MD 02/07/15 2149

## 2015-02-06 NOTE — ED Notes (Addendum)
Per Horner in [facility], POC Urine Preg is NEGATIVE. CT aware.

## 2015-02-06 NOTE — H&P (Signed)
Date: 02/07/2015               Patient Name:  Alisha [name] MRN: [id number]  DOB: [birth year] Age / Sex: 54 y.o., female   PCP: Julious Oka, MD         Medical Service: Internal Medicine Teaching Service         Attending Physician: Dr. Thayer Headings, MD    First Contact: Dr.  Ethelene Hal Pager: [phone]  Second Contact: Dr. Redmond Pulling Pager: [phone]       After Hours (After 5p/  First Contact Pager: [phone]  weekends / holidays): Second Contact Pager: [phone]   Chief Complaint: abdominal pain, back pain, fever, and urinary discomfort  History of Present Illness:   54 yo female with hx of DVT on warfarin, chronic leg pain, anxiety/depression, HLD, HTN, PVD (fem bypass, stent) , hx of sigmoid diverticulitis 2013,  presents with complaint of ab pain and urinary discomfort for last 4 days. Its located on the flank area, starting from her back radiating down to lower ab on both sides. It's sharp and intense, also crampy, no relief with tylenol at home. She has burning with urination, mild urgency and frequency. Has low Also had n/v, nb/nb, but has resolved. Denies diarrhea. Had subjective fever at home, denies cp or SOB. Hasn't been eating for last 3-4 days at home due to these symptoms.  Has WBC 16.6. BMP is normal. INR 1.16. UTI positive for many bacteria, protein >300, small leuk. CT angio ab aorta was done since she has hx of vascular problems, showing acute diverticulitis at sigmoid colon, unclear if there is an abscess. No free air. Kidneys unremarkable.  Meds: Current Facility-Administered Medications  Medication Dose Route Frequency Provider Last Rate Last Dose  . 0.9 %  sodium chloride infusion   Intravenous STAT Clayton Bibles, PA-C 100 mL/hr at 02/06/15 2043    . aspirin EC tablet 81 mg  81 mg Oral Daily Blain Pais, MD   81 mg at 02/06/15 2044  . atorvastatin (LIPITOR) tablet 40 mg  40 mg Oral QHS Blain Pais, MD      . buPROPion (WELLBUTRIN XL) 24 hr tablet 300 mg  300  mg Oral Daily Blain Pais, MD   300 mg at 02/06/15 2044  . carvedilol (COREG) tablet 25 mg  25 mg Oral BID WC Blain Pais, MD      . ciprofloxacin (CIPRO) IVPB 400 mg  400 mg Intravenous Q12H Blain Pais, MD      . diazepam (VALIUM) tablet 10 mg  10 mg Oral TID Blain Pais, MD      . DULoxetine (CYMBALTA) DR capsule 60 mg  60 mg Oral Daily Blain Pais, MD   60 mg at 02/06/15 2044  . gabapentin (NEURONTIN) capsule 800 mg  800 mg Oral BID Thayer Headings, MD      . heparin ADULT infusion 100 units/mL (25000 units/250 mL)  1,050 Units/hr Intravenous Continuous Thayer Headings, MD 10.5 mL/hr at 02/07/15 0007 1,050 Units/hr at 02/07/15 0007  . hydrochlorothiazide (MICROZIDE) capsule 12.5 mg  12.5 mg Oral Daily Thayer Headings, MD   12.5 mg at 02/06/15 2044  . HYDROmorphone (DILAUDID) injection 1 mg  1 mg Intravenous Q4H PRN Blain Pais, MD   1 mg at 02/07/15 0000  . hydrOXYzine (ATARAX/VISTARIL) tablet 25 mg  25 mg Oral TID PRN Blain Pais, MD      . lisinopril (PRINIVIL,ZESTRIL)  tablet 20 mg  20 mg Oral BID Blain Pais, MD      . metroNIDAZOLE (FLAGYL) IVPB 500 mg  500 mg Intravenous Q8H Blain Pais, MD      . ondansetron [facility]) injection 4 mg  4 mg Intravenous Q8H PRN Clayton Bibles, PA-C      . potassium chloride (K-DUR) CR tablet 10 mEq  10 mEq Oral Daily Blain Pais, MD   10 mEq at 02/06/15 2044  . Warfarin - Pharmacist Dosing Inpatient   Does not apply [id number] Thayer Headings, MD   0  at 02/06/15 2004  . ziprasidone (GEODON) capsule 60 mg  60 mg Oral BID WC Blain Pais, MD        Allergies: Allergies as of 02/06/2015 - Review Complete 02/06/2015  Allergen Reaction Noted  . Propoxyphene n-acetaminophen Swelling    Past Medical History  Diagnosis Date  . DVT (deep venous thrombosis)     bilateral, 2 episodes: Requires lifelong therapy  . Chronic pain syndrome   . Restless leg syndrome   . Depression   .  Hyperlipidemia   . Hypertension   . PVD (peripheral vascular disease)     s/p L fem-pop bypass 11/21/08; graft occluded 12/28/08;  aortogram w/ bilat LE runoff: 1.  bilat diffuse SFA occlusive dz, 2.  Mod to severe above-knee popliteal dz,  3.  Bilat 3-vessel runoff w/ mild tibial occlusive dz.  . Tobacco abuse   . Degenerative joint disease of knee   . Breast lump 02/2008    Biopsy 05/2008: showed no evidence of malignancy  . Irregular menses 9/08  . Pulmonary edema   . Anxiety   . Joint pain   . Mixed stress and urge urinary incontinence     [facility], underwent cystoscopy & uroflowmetry   . Endometrial mass 10/12/2012    Endometrium, biopsy on 11/04/12 - PROLIFERATIVE ENDOMETRIUM AND ABUNDANT MUCUS. NO HYPERPLASIA OR CARCINOMA.   . Sigmoid diverticulitis 10/26/2012  . Asthma   . Breast mass in female     bi lat    Past Surgical History  Procedure Laterality Date  . Femoral bypass  11/09  .  right external iliac artery stent    . Right breast needle-localized lumpectomy    . Pr vein bypass graft,aorto-fem-pop    . Carotid endarterectomy    . Tubal ligation    . Tonsillectomy    . Cardiac catheterization  2/15  . Breast lumpectomy with radioactive seed localization Bilateral 05/03/2014    Procedure: BREAST LUMPECTOMY WITH RADIOACTIVE SEED LOCALIZATION;  Surgeon: Joyice Faster. Cornett, MD;  Location: [place];  Service: General;  Laterality: Bilateral;  . Left heart catheterization with coronary angiogram N/A 02/25/2014    Procedure: LEFT HEART CATHETERIZATION WITH CORONARY ANGIOGRAM;  Surgeon: Peter M Martinique, MD;  Location: [facility] CATH LAB;  Service: Cardiovascular;  Laterality: N/A;   Family History  Problem Relation Age of Onset  . Breast cancer Mother   . Colon cancer Neg Hx   . Rectal cancer Neg Hx   . Stomach cancer Neg Hx   . Breast cancer Other     GREAT AUNT   History   Social History  . Marital Status: Divorced    Spouse Name: N/A     Number of Children: 108  . Years of Education: N/A   Occupational History  . disable    Social History Main Topics  . Smoking status: Current Every  Day Smoker -- 0.50 packs/day for 18 years    Types: Cigarettes  . Smokeless tobacco: Never Used     Comment: 3-6 per day  . Alcohol Use: Yes     Comment: rare  . Drug Use: No     Comment: hx of marijuana and cocaine use  . Sexual Activity: No   Other Topics Concern  . Not on file   Social History Narrative   Financial assistance application initiated. Patient needs to submit further paperwork to complete   [facility]  September 11, 2010 2:04 PM   Financial assistance approved for 100% discount at [facility] and has [facility] card   [name]  October 04, 2010 5:29 PM    Review of Systems: Review of Systems  Constitutional: Positive for fever and chills. Negative for weight loss, malaise/fatigue and diaphoresis.  HENT: Negative for congestion, ear discharge, ear pain, hearing loss, nosebleeds, sore throat and tinnitus.   Eyes: Negative for blurred vision, double vision, photophobia and pain.  Respiratory: Negative for cough, sputum production, shortness of breath and wheezing.   Cardiovascular: Negative for chest pain, palpitations, leg swelling and PND.  Gastrointestinal: Positive for nausea, vomiting and abdominal pain. Negative for heartburn, diarrhea, constipation, blood in stool and melena.  Genitourinary: Positive for dysuria, urgency, frequency and flank pain. Negative for hematuria.  Musculoskeletal: Negative.   Skin: Negative.   Neurological: Negative.  Negative for weakness and headaches.  Endo/Heme/Allergies: Negative.   Psychiatric/Behavioral: Negative.     Physical Exam: Blood pressure 139/77, pulse 94, temperature 98.9 F (37.2 C), temperature source Oral, resp. rate 21, height 5' 4.8" (1.646 m), weight 80 kg (176 lb 5.9 oz), SpO2 99 %.  Physical Exam  Constitutional: She is oriented to person, place, and time. She  appears well-developed and well-nourished. She appears distressed.  Appears to be in pain. Cooperative.   HENT:  Head: Normocephalic and atraumatic.  Right Ear: External ear normal.  Left Ear: External ear normal.  Nose: Nose normal.  Mouth/Throat: No oropharyngeal exudate.  Eyes: Conjunctivae and EOM are normal. Pupils are equal, round, and reactive to light. Right eye exhibits no discharge. Left eye exhibits no discharge.  Neck: Normal range of motion. Neck supple. No JVD present.  Cardiovascular: Normal rate, regular rhythm and normal heart sounds.  Exam reveals no gallop and no friction rub.   No murmur heard. Respiratory: Effort normal and breath sounds normal. No stridor. No respiratory distress. She has no wheezes. She has no rales. She exhibits no tenderness.  GI: Soft. Bowel sounds are normal. She exhibits no distension and no mass. There is tenderness. There is no rebound and no guarding.  Has TTP on b/l flank area.   Musculoskeletal: Normal range of motion. She exhibits no edema or tenderness.  Lymphadenopathy:    She has no cervical adenopathy.  Neurological: She is alert and oriented to person, place, and time. She has normal reflexes. She displays normal reflexes. No cranial nerve deficit. She exhibits normal muscle tone. Coordination normal.  Skin: She is not diaphoretic.     Lab results: Basic Metabolic Panel:  Recent Labs  02/06/15 1026  NA 138  K 3.9  CL 104  CO2 21  GLUCOSE 109*  BUN 9  CREATININE 0.75  CALCIUM 9.0   Liver Function Tests:  Recent Labs  02/06/15 1026  AST 20  ALT 16  ALKPHOS 72  BILITOT 1.5*  PROT 7.2  ALBUMIN 3.1*    Recent Labs  02/06/15 1026  LIPASE 21   No results for input(s): AMMONIA in the last 72 hours. CBC:  Recent Labs  02/06/15 1026  WBC 16.6*  NEUTROABS 13.1*  HGB 14.4  HCT 41.3  MCV 94.5  PLT 212   Cardiac Enzymes: No results for input(s): CKTOTAL, CKMB, CKMBINDEX, TROPONINI in the last 72  hours. BNP: No results for input(s): PROBNP in the last 72 hours. D-Dimer: No results for input(s): DDIMER in the last 72 hours. CBG: No results for input(s): GLUCAP in the last 72 hours. Hemoglobin A1C: No results for input(s): HGBA1C in the last 72 hours. Fasting Lipid Panel: No results for input(s): CHOL, HDL, LDLCALC, TRIG, CHOLHDL, LDLDIRECT in the last 72 hours. Thyroid Function Tests: No results for input(s): TSH, T4TOTAL, FREET4, T3FREE, THYROIDAB in the last 72 hours. Anemia Panel: No results for input(s): VITAMINB12, FOLATE, FERRITIN, TIBC, IRON, RETICCTPCT in the last 72 hours. Coagulation:  Recent Labs  02/06/15 1026  LABPROT 14.9  INR 1.16   Urine Drug Screen: Drugs of Abuse     Component Value Date/Time   LABOPIA NEG 10/13/2013 1555   LABOPIA NEG 11/06/2010 0000   COCAINSCRNUR PPS 10/13/2013 1555   COCAINSCRNUR NONE DETECTED 12/05/2009 1911   LABBENZ PPS 10/13/2013 1555   LABBENZ NEG 11/06/2010 0000   LABBENZ POSITIVE* 12/05/2009 1911   AMPHETMU NEG 10/13/2013 1555   AMPHETMU NEG 11/06/2010 0000   AMPHETMU NONE DETECTED 12/05/2009 1911   THCU NEG 10/13/2013 1555   THCU NONE DETECTED 12/05/2009 1911   LABBARB NEG 10/13/2013 1555   LABBARB  12/05/2009 1911    NONE DETECTED        DRUG SCREEN FOR MEDICAL PURPOSES ONLY.  IF CONFIRMATION IS NEEDED FOR ANY PURPOSE, NOTIFY LAB WITHIN 5 DAYS.        LOWEST DETECTABLE LIMITS FOR URINE DRUG SCREEN Drug Class       Cutoff (ng/mL) Amphetamine      1000 Barbiturate      200 Benzodiazepine   784 Tricyclics       696 Opiates          300 Cocaine          300 THC              50    Alcohol Level: No results for input(s): ETH in the last 72 hours. Urinalysis:  Recent Labs  02/06/15 1159  COLORURINE AMBER*  LABSPEC 1.040*  PHURINE 6.0  GLUCOSEU NEGATIVE  HGBUR NEGATIVE  BILIRUBINUR LARGE*  KETONESUR 40*  PROTEINUR >300*  UROBILINOGEN 1.0  NITRITE NEGATIVE  LEUKOCYTESUR SMALL*   Misc.  Labs:  Imaging results:  Ct Angio Ao+bifem W/cm &/or Wo/cm  02/06/2015   CLINICAL DATA:  Back pain, nausea and fever. History of peripheral vascular disease with known chronic SFA occlusions and right external iliac artery stenosis.  EXAM: CT ANGIOGRAPHY OF ABDOMINAL AORTA WITH ILIOFEMORAL RUNOFF  TECHNIQUE: Multidetector CT imaging of the abdomen, pelvis and lower extremities was performed using the standard protocol during bolus administration of intravenous contrast. Multiplanar CT image reconstructions and MIPs were obtained to evaluate the vascular anatomy.  CONTRAST:  134mL OMNIPAQUE IOHEXOL 350 MG/ML SOLN  COMPARISON:  10/03/2008  FINDINGS: Aorta: The abdominal aorta is normally patent and demonstrates mild plaque. No evidence of aortic aneurysm or stenosis. The celiac axis, superior mesenteric artery and inferior mesenteric artery show stable and normal patency. Midportion of the left renal artery demonstrates stable moderate focal stenosis of approximately 50% narrowing. Mild progression of plaque in  the proximal right renal artery without significant stenosis identified.  Right Lower Extremity: Calcified plaque present in the common iliac artery without significant stenosis. Previously identified focal stenosis of the mid right external iliac artery has been stented with a widely patent stent present. The common femoral artery shows distal stenosis of approximately 50%. The profunda femoral artery is normally patent. The SFA is chronically occluded at its origin. Distal SFA is reconstituted by collaterals at the adductor hiatus. The popliteal artery is normally patent. Three-vessel runoff is demonstrated into the foot with dominant anterior tibial runoff.  Left Lower Extremity: Calcified plaque is present in the common and external iliac arteries without significant stenosis identified. Distal common femoral artery becomes of larger caliber, likely related to prior patch angioplasty. The native SFA is  chronically occluded. The profunda femoral artery is open. The popliteal artery is reconstituted above the knee. Peroneal posterior tibial arteries are open into the foot. The anterior tibial artery is open but poorly opacified and diseased with calcified plaque present.  Arterial phase evaluation of the solid organs is unremarkable including the liver, gallbladder, pancreas, spleen, adrenal glands and kidneys.  There is thickening and inflammation involving the sigmoid colon. Lumen is difficult to follow in the pelvis and there may be some developing areas of abscess formation. Findings are most likely consistent with diverticulitis. Mass cannot be excluded. Follow-up imaging with CT of the abdomen and pelvis during venous phase contrast opacification and with oral contrast may be helpful. No free intraperitoneal air is identified. No enlarged lymph nodes are seen.  Review of the MIP images confirms the above findings.  IMPRESSION: 1. Evidence of probable diverticulitis involving the sigmoid colon. There may be developing adjacent diverticular abscess is predominantly posterior and inferior to the sigmoid colon. This is difficult to delineate without oral contrast on poor and during the arterial phase CTA. Mass of the colon cannot be excluded on the current study. Follow-up with venous phase CT of the abdomen and pelvis with oral contrast would be helpful. 2. 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 3. Chronic bilateral SFA occlusions.   Electronically Signed   By: Aletta Edouard M.D.   On: 02/06/2015 17:19    Other results: EKG: none  Assessment & Plan by Problem: Principal Problem:   Diverticulitis Active Problems:   Chronic pain syndrome   Long term current use of anticoagulant   HTN (hypertension)   UTI (lower urinary tract infection)  54 yo female hx HTN, hx of diverticulitis, chronic leg pain, HTN, CHF here with ab pain found to have UTI and acute diverticulitis.   UTI - with  symptoms and UA, with wbc and subjective fever - treat with cipro which should cover for diverticulitis as well if she has new acute diverticulitis. - f/up ucx. Previous cx didn't show any bug. Pain control with dilaudid. No kidney abnl seen on CT angio.  Acute diverticulitis. Has hx of it on 2013 - unclear if there is abscess but no free air - has hx of sigmoid diverticulitis 09/2012.  - abscess not clear on CT angio of abdominal aorta. Could be the previous diverticulitis. Doesn't appear to be a complicated diverticulitis. No free air.  - will treat with cipro+flagyl for uncomplicated diverticulitis. If patient worsens, may consider repeat CT abdomen/pelvis and depending on the size of the mass, may consider IR drainage vs Surgery consult - had cscope 12/2013 with 4 polyps removed.  CHF - Echo 2007 45% EF.  CAD - cath 02/25/2014  - 02/10/14 stress test low risk but anterior abn, Cath on 02/25/14 nonobstructive CAD, normal LV function 55-65%.  - asa, lisinopril, hctz.   HTN - on coreg 25 + lisinopril, hctz.  - cont these.   DVT hx bilateral DVT 2 espidoes - on lifetime anticoag.  - cont coumadin per pharm. Will bridge with heparin gtt with INR subtherapeutic 1.16 currently.   HLD - cont lipitor  Depression - cont wellbutrin, ziodan, cymbalta, valium.   Dispo: Disposition is deferred at this time, awaiting improvement of current medical problems. Anticipated discharge in approximately 1-2 day(s).   The patient does have a current PCP Julious Oka, MD) and does need an [facility] hospital follow-up appointment after discharge.  The patient does have transportation limitations that hinder transportation to clinic appointments.  Signed: Dellia Nims, MD 02/07/2015, 1:19 AM

## 2015-02-06 NOTE — Progress Notes (Addendum)
ANTICOAGULATION CONSULT NOTE - Initial Consult  Pharmacy Consult for coumadin and heparin Indication: hx of bilateral DVT  Allergies  Allergen Reactions  . Propoxyphene N-Acetaminophen Swelling    Patient Measurements: Height: 5' 4.8" (164.6 cm) Weight: 176 lb 5.9 oz (80 kg) IBW/kg (Calculated) : 56.54 Heparin Dosing Weight: 73.5 kg  Vital Signs: Temp: 98.9 F (37.2 C) (02/08 1932) Temp Source: Oral (02/08 1932) BP: 139/77 mmHg (02/08 1932) Pulse Rate: 94 (02/08 1932)  Labs:  Recent Labs  02/06/15 1026  HGB 14.4  HCT 41.3  PLT 212  LABPROT 14.9  INR 1.16  CREATININE 0.75    Estimated Creatinine Clearance: 85.6 mL/min (by C-G formula based on Cr of 0.75).   Medical History: Past Medical History  Diagnosis Date  . DVT (deep venous thrombosis)     bilateral, 2 episodes: Requires lifelong therapy  . Chronic pain syndrome   . Restless leg syndrome   . Depression   . Hyperlipidemia   . Hypertension   . PVD (peripheral vascular disease)     s/p L fem-pop bypass 11/21/08; graft occluded 12/28/08;  aortogram w/ bilat LE runoff: 1.  bilat diffuse SFA occlusive dz, 2.  Mod to severe above-knee popliteal dz,  3.  Bilat 3-vessel runoff w/ mild tibial occlusive dz.  . Tobacco abuse   . Degenerative joint disease of knee   . Breast lump 02/2008    Biopsy 05/2008: showed no evidence of malignancy  . Irregular menses 9/08  . Pulmonary edema   . Anxiety   . Joint pain   . Mixed stress and urge urinary incontinence     [facility], underwent cystoscopy & uroflowmetry   . Endometrial mass 10/12/2012    Endometrium, biopsy on 11/04/12 - PROLIFERATIVE ENDOMETRIUM AND ABUNDANT MUCUS. NO HYPERPLASIA OR CARCINOMA.   . Sigmoid diverticulitis 10/26/2012  . Asthma   . Breast mass in female     bi lat     Medications:  Scheduled:  . sodium chloride   Intravenous STAT  . aspirin EC  81 mg Oral Daily  . atorvastatin  40 mg Oral QHS  . buPROPion  300 mg  Oral Daily  . [START ON 02/07/2015] carvedilol  25 mg Oral BID WC  . DULoxetine  60 mg Oral Daily  . gabapentin  800 mg Oral BID  . hydrochlorothiazide  12.5 mg Oral Daily  . lisinopril  20 mg Oral BID  . metronidazole  500 mg Intravenous Once  . potassium chloride  10 mEq Oral Daily  . [START ON 02/07/2015] ziprasidone  60 mg Oral BID WC   Infusions:    Assessment: 54 yo female with hx of bilateral DVT will be continued on coumadin but will also be bridged with heparin.  Baseline Hgb 14.4 and Plt 212 K.  INR 1.16 today.  Patient was on coumadin 7.5 mg alternating with 3.75 mg daily.  Last dose was last week.  Now on metronidazole.   Goal of Therapy:  Heparin level 0.3-0.7 units/ml; INR 2-3 Monitor platelets by anticoagulation protocol: Yes   Plan:  - coumadin 5 mg po x1 and daily INR - Heparin 4000 units iv bolus x1, then start heparin drip at 1050 units/hr - 6hr heparin level  - daily heparin level and CBC  Raigan Baria, Tsz-Yin 02/06/2015,7:54 PM

## 2015-02-06 NOTE — ED Provider Notes (Signed)
CSN: [id number]     Arrival date & time 02/06/15  1027 History   First MD Initiated Contact with Patient 02/06/15 1012     Chief Complaint  Patient presents with  . Urinary Tract Infection  . 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 Patient reports for the past 3 days she has noticed burning urination, having mild urgency and frequency. She endorses low abdominal pain and back pain that has been persistent, sharp and intense. She endorses nausea and has vomited multiple times of nonbloody nonbilious vomitus. She denies any diarrhea. She reports subjective fever, but denies any chest pain shortness of breath. No complaint of lightheadedness dizziness. She also mentioned that she has history of arthritis and her overtime body hurts. She tries taking Tylenol along with her pain medication at home with minimal relief.  Past Medical History  Diagnosis Date  . DVT (deep venous thrombosis)     bilateral, 2 episodes: Requires lifelong therapy  . Chronic pain syndrome   . Restless leg syndrome   . Depression   . Hyperlipidemia   . Hypertension   . PVD (peripheral vascular disease)     s/p L fem-pop bypass 11/21/08; graft occluded 12/28/08;  aortogram w/ bilat LE runoff: 1.  bilat diffuse SFA occlusive dz, 2.  Mod to severe above-knee popliteal dz,  3.  Bilat 3-vessel runoff w/ mild tibial occlusive dz.  . Tobacco abuse   . Degenerative joint disease of knee   . Breast lump 02/2008    Biopsy 05/2008: showed no evidence of malignancy  . Irregular menses 9/08  . Pulmonary edema   . Anxiety   . Joint pain   . Mixed stress and urge urinary incontinence     [facility],  underwent cystoscopy & uroflowmetry   . Endometrial mass 10/12/2012    Endometrium, biopsy on 11/04/12 - PROLIFERATIVE ENDOMETRIUM AND ABUNDANT MUCUS. NO HYPERPLASIA OR CARCINOMA.   . Sigmoid diverticulitis 10/26/2012  . Asthma   . Breast mass in female     bi lat    Past Surgical History  Procedure Laterality Date  . Femoral bypass  11/09  .  right external iliac artery stent    . Right breast needle-localized lumpectomy    . Pr vein bypass graft,aorto-fem-pop    . Carotid endarterectomy    . Tubal ligation    . Tonsillectomy    . Cardiac catheterization  2/15  . Breast lumpectomy with radioactive seed localization Bilateral 05/03/2014    Procedure: BREAST LUMPECTOMY WITH RADIOACTIVE SEED LOCALIZATION;  Surgeon: Joyice Faster. Cornett, MD;  Location: [facility];  Service: General;  Laterality: Bilateral;  . Left heart catheterization with coronary angiogram N/A 02/25/2014    Procedure: LEFT HEART CATHETERIZATION WITH CORONARY ANGIOGRAM;  Surgeon: Peter M Martinique, MD;  Location: [facility] CATH LAB;  Service: Cardiovascular;  Laterality: N/A;   Family History  Problem Relation Age of Onset  . Breast cancer Mother   . Colon cancer Neg Hx   . Rectal cancer Neg Hx   . Stomach cancer Neg Hx   . Breast cancer Other     GREAT AUNT   History  Substance Use Topics  . Smoking status: Current Every Day Smoker -- 0.50 packs/day for 18 years  Types: Cigarettes  . Smokeless tobacco: Never Used     Comment: 3-6 per day  . Alcohol Use: Yes     Comment: rare   OB History    Gravida Para Term Preterm AB TAB SAB Ectopic Multiple Living   6 4 4  2 1 1   4      Review of Systems  All other systems reviewed and are negative.     Allergies  Propoxyphene n-acetaminophen  Home Medications   Prior to Admission medications   Medication Sig Start Date End Date Taking? Authorizing Provider  albuterol (PROVENTIL HFA;VENTOLIN HFA) 108 (90 BASE) MCG/ACT inhaler Inhale 1-2 puffs into the  lungs every 4 (four) hours as needed for wheezing or shortness of breath. 08/29/14   Charlott Rakes, MD  aspirin 325 MG tablet Take 325 mg by mouth daily.    Historical Provider, MD  atorvastatin (LIPITOR) 40 MG tablet Take 1 tablet (40 mg total) by mouth at bedtime. 11/05/13   Ejiroghene Arlyce Dice, MD  carvedilol (COREG) 25 MG tablet Take 1 tablet (25 mg total) by mouth 2 (two) times daily with a meal. 02/28/14   Karren Cobble, MD  diazepam (VALIUM) 5 MG tablet Take 10 mg by mouth 3 (three) times daily.     Historical Provider, MD  DULoxetine (CYMBALTA) 60 MG capsule Take 60 mg by mouth daily.     Historical Provider, MD  gabapentin (NEURONTIN) 800 MG tablet Take 1 tablet in the morning, 1 tablet midday and 2 tablets at bedtime 01/02/15   Julious Oka, MD  hydrochlorothiazide (HYDRODIURIL) 12.5 MG tablet Take 1 tablet (12.5 mg total) by mouth daily. 08/11/14   Julious Oka, MD  hydrOXYzine (ATARAX/VISTARIL) 25 MG tablet Take 25 mg by mouth 3 (three) times daily as needed for anxiety.    Historical Provider, MD  lisinopril (PRINIVIL,ZESTRIL) 20 MG tablet Take 1 tablet (20 mg total) by mouth 2 (two) times daily. 08/11/14 08/11/15  Julious Oka, MD  Multiple Vitamins-Minerals (ONE-A-DAY 50 PLUS PO) Take 1 tablet by mouth daily.    Historical Provider, MD  potassium chloride (K-DUR) 10 MEQ tablet TAKE 1 TABLET BY MOUTH EVERY DAY 08/29/14   Thayer Headings, MD  traZODone (DESYREL) 100 MG tablet Take 200-300 mg by mouth at bedtime.     Clarene Reamer, MD  warfarin (COUMADIN) 7.5 MG tablet Take 3.75-7.5 mg by mouth daily. Take 3.75mg  alternating with 7.5mg  daily    Historical Provider, MD  ziprasidone (GEODON) 60 MG capsule Take 60 mg by mouth 2 (two) times daily with a meal.     Historical Provider, MD   BP 148/98 mmHg  Pulse 85  Temp(Src) 98.2 F (36.8 C) (Oral)  Resp 17  Ht 5' 4.5" (1.638 m)  Wt 169 lb (76.658 kg)  BMI 28.57 kg/m2  SpO2 99% Physical Exam  Constitutional: She is oriented to person,  place, and time. She appears well-developed and well-nourished. No distress.  African-American female appears tearful but nontoxic in appearance.  HENT:  Head: Atraumatic.  Eyes: Conjunctivae are normal.  Neck: Neck supple.  Cardiovascular: Normal rate, regular rhythm and intact distal pulses.   Pulmonary/Chest: Effort normal and breath sounds normal.  Abdominal: Soft. She exhibits no distension. There is tenderness (Mild generalized abdominal tenderness without guarding or rebound tenderness. No peritoneal sign.).  No abdominal bruit.  Musculoskeletal: Normal range of motion. She exhibits no edema.  BLE: no palpable cords, erythema, edema.  Brisk cap refills to all toes and no  cyanosis  Neurological: She is alert and oriented to person, place, and time.  Skin: No rash noted.  Psychiatric: She has a normal mood and affect.  Nursing note and vitals reviewed.   ED Course  Procedures (including critical care time)  Patient here with dysuria, and abdominal pain. She mentioned that her pain radiates to the back. Will check for signs of UTI. Low suspicion for AAA or aortic dissection. She is afebrile with stable normal vital sign.  She is scheduled to be seen by the pain clinic, [facility] but has not been evaluated by them yet.  11:06 AM Pt does have PVD, prior fem/pop.  Care discussed with Dr. Alvino Chapel.  May consider abd/pelvis CT with angio to assess for AAA or dissection.    4:09 PM Care discussed with oncoming provider who will dispo pt pending CT scan.  If negative, please discharge with Keflex for treatment of UTI.    Labs Review Labs Reviewed  CBC WITH DIFFERENTIAL/PLATELET - Abnormal; Notable for the following:    WBC 16.6 (*)    Neutrophils Relative % 79 (*)    Neutro Abs 13.1 (*)    Lymphocytes Relative 11 (*)    Monocytes Absolute 1.6 (*)    All other components within normal limits  COMPREHENSIVE METABOLIC PANEL - Abnormal; Notable for the following:    Glucose, Bld 109 (*)     Albumin 3.1 (*)    Total Bilirubin 1.5 (*)    All other components within normal limits  URINALYSIS, ROUTINE W REFLEX MICROSCOPIC - Abnormal; Notable for the following:    Color, Urine AMBER (*)    APPearance CLOUDY (*)    Specific Gravity, Urine 1.040 (*)    Bilirubin Urine LARGE (*)    Ketones, ur 40 (*)    Protein, ur >300 (*)    Leukocytes, UA SMALL (*)    All other components within normal limits  URINE MICROSCOPIC-ADD ON - Abnormal; Notable for the following:    Squamous Epithelial / LPF MANY (*)    Bacteria, UA MANY (*)    All other components within normal limits  LIPASE, BLOOD  PROTIME-INR    Imaging Review No results found.   EKG Interpretation None      MDM   Final diagnoses:  UTI (lower urinary tract infection)  Diverticulitis of large intestine with abscess without bleeding    BP 166/104 mmHg  Pulse 87  Temp(Src) 98.2 F (36.8 C) (Oral)  Resp 18  Ht 5' 4.5" (1.638 m)  Wt 169 lb (76.658 kg)  BMI 28.57 kg/m2  SpO2 95%  LMP  (LMP Unknown)  I have reviewed nursing notes and vital signs. I personally reviewed the imaging tests through PACS system  I reviewed available ER/hospitalization records thought the EMR    Domenic Moras, PA-C 02/08/15 [place]. Alvino Chapel, MD 02/09/15 1505

## 2015-02-06 NOTE — ED Notes (Signed)
Contacted CT regarding delay.

## 2015-02-06 NOTE — ED Notes (Signed)
Pt placed on bedpan, states unable to void at this time.

## 2015-02-06 NOTE — ED Notes (Signed)
Pt sts pain in stomach and UTI symptoms x 3 days. sts also her bones are hurting hx of RA. sts N,V. Denies diarrhea.

## 2015-02-06 NOTE — ED Notes (Signed)
Pt returned from CT °

## 2015-02-07 ENCOUNTER — Inpatient Hospital Stay (HOSPITAL_COMMUNITY): Payer: Medicare Other

## 2015-02-07 ENCOUNTER — Encounter (HOSPITAL_COMMUNITY): Payer: Self-pay | Admitting: *Deleted

## 2015-02-07 DIAGNOSIS — I2510 Atherosclerotic heart disease of native coronary artery without angina pectoris: Secondary | ICD-10-CM

## 2015-02-07 DIAGNOSIS — Z7901 Long term (current) use of anticoagulants: Secondary | ICD-10-CM

## 2015-02-07 DIAGNOSIS — F17210 Nicotine dependence, cigarettes, uncomplicated: Secondary | ICD-10-CM

## 2015-02-07 DIAGNOSIS — Z86718 Personal history of other venous thrombosis and embolism: Secondary | ICD-10-CM

## 2015-02-07 DIAGNOSIS — I10 Essential (primary) hypertension: Secondary | ICD-10-CM

## 2015-02-07 DIAGNOSIS — E785 Hyperlipidemia, unspecified: Secondary | ICD-10-CM

## 2015-02-07 DIAGNOSIS — F329 Major depressive disorder, single episode, unspecified: Secondary | ICD-10-CM

## 2015-02-07 DIAGNOSIS — R509 Fever, unspecified: Secondary | ICD-10-CM

## 2015-02-07 DIAGNOSIS — N390 Urinary tract infection, site not specified: Secondary | ICD-10-CM

## 2015-02-07 DIAGNOSIS — Z8719 Personal history of other diseases of the digestive system: Secondary | ICD-10-CM

## 2015-02-07 DIAGNOSIS — B9689 Other specified bacterial agents as the cause of diseases classified elsewhere: Secondary | ICD-10-CM

## 2015-02-07 DIAGNOSIS — Z95818 Presence of other cardiac implants and grafts: Secondary | ICD-10-CM

## 2015-02-07 DIAGNOSIS — I509 Heart failure, unspecified: Secondary | ICD-10-CM

## 2015-02-07 DIAGNOSIS — K5792 Diverticulitis of intestine, part unspecified, without perforation or abscess without bleeding: Secondary | ICD-10-CM

## 2015-02-07 LAB — CBC
HCT: 39.9 % (ref 36.0–46.0)
Hemoglobin: 13.8 g/dL (ref 12.0–15.0)
MCH: 32.5 pg (ref 26.0–34.0)
MCHC: 34.6 g/dL (ref 30.0–36.0)
MCV: 94.1 fL (ref 78.0–100.0)
Platelets: 213 10*3/uL (ref 150–400)
RBC: 4.24 MIL/uL (ref 3.87–5.11)
RDW: 12.8 % (ref 11.5–15.5)
WBC: 17.6 10*3/uL — ABNORMAL HIGH (ref 4.0–10.5)

## 2015-02-07 LAB — BASIC METABOLIC PANEL
Anion gap: 12 (ref 5–15)
BUN: 6 mg/dL (ref 6–23)
CO2: 24 mmol/L (ref 19–32)
Calcium: 9.1 mg/dL (ref 8.4–10.5)
Chloride: 101 mmol/L (ref 96–112)
Creatinine, Ser: 0.8 mg/dL (ref 0.50–1.10)
GFR, EST NON AFRICAN AMERICAN: 83 mL/min — AB (ref >90)
Glucose, Bld: 102 mg/dL — ABNORMAL HIGH (ref 70–99)
POTASSIUM: 3.2 mmol/L — AB (ref 3.5–5.1)
SODIUM: 137 mmol/L (ref 135–145)

## 2015-02-07 LAB — HEPARIN LEVEL (UNFRACTIONATED)
HEPARIN UNFRACTIONATED: 0.13 [IU]/mL — AB (ref 0.30–0.70)
Heparin Unfractionated: <0.1 IU/mL — ABNORMAL LOW (ref 0.30–0.70)
Heparin Unfractionated: 0.2 IU/mL — ABNORMAL LOW (ref 0.30–0.70)

## 2015-02-07 LAB — PROTIME-INR
INR: 1.21 (ref 0.00–1.49)
Prothrombin Time: 15.4 seconds — ABNORMAL HIGH (ref 11.6–15.2)

## 2015-02-07 MED ORDER — HEPARIN BOLUS VIA INFUSION
1000.0000 [IU] | Freq: Once | INTRAVENOUS | Status: AC
  Administered 2015-02-07: 1000 [IU] via INTRAVENOUS
  Filled 2015-02-07: qty 1000

## 2015-02-07 MED ORDER — IOHEXOL 300 MG/ML  SOLN
25.0000 mL | INTRAMUSCULAR | Status: AC
  Administered 2015-02-07 (×2): 25 mL via ORAL

## 2015-02-07 MED ORDER — POTASSIUM CHLORIDE CRYS ER 20 MEQ PO TBCR
40.0000 meq | EXTENDED_RELEASE_TABLET | Freq: Once | ORAL | Status: AC
  Administered 2015-02-07: 40 meq via ORAL
  Filled 2015-02-07: qty 2

## 2015-02-07 MED ORDER — CIPROFLOXACIN HCL 500 MG PO TABS: 500.0000 mg | ORAL_TABLET | Freq: Two times a day (BID) | ORAL | Status: DC

## 2015-02-07 MED ORDER — IOHEXOL 300 MG/ML  SOLN
100.0000 mL | Freq: Once | INTRAMUSCULAR | Status: AC | PRN
  Administered 2015-02-07: 100 mL via INTRAVENOUS

## 2015-02-07 MED ORDER — METRONIDAZOLE 500 MG PO TABS: 500.0000 mg | ORAL_TABLET | Freq: Three times a day (TID) | ORAL | Status: DC

## 2015-02-07 MED ORDER — BOOST / RESOURCE BREEZE PO LIQD
1.0000 | Freq: Two times a day (BID) | ORAL | Status: DC
  Administered 2015-02-07 – 2015-02-08 (×3): 1 via ORAL

## 2015-02-07 MED ORDER — ONDANSETRON HCL 4 MG/2ML IJ SOLN: 4.0000 mg | Freq: Three times a day (TID) | INTRAMUSCULAR | Status: DC | PRN

## 2015-02-07 MED ORDER — HEPARIN BOLUS VIA INFUSION
2000.0000 [IU] | Freq: Once | INTRAVENOUS | Status: AC
  Administered 2015-02-07: 2000 [IU] via INTRAVENOUS
  Filled 2015-02-07: qty 2000

## 2015-02-07 MED ORDER — DIAZEPAM 5 MG PO TABS
10.0000 mg | ORAL_TABLET | Freq: Three times a day (TID) | ORAL | Status: DC
Start: 2015-02-07 — End: 2015-02-09
  Administered 2015-02-07 – 2015-02-09 (×7): 10 mg via ORAL
  Filled 2015-02-07 (×7): qty 2

## 2015-02-07 MED ORDER — WARFARIN SODIUM 5 MG PO TABS
5.0000 mg | ORAL_TABLET | Freq: Once | ORAL | Status: AC
  Administered 2015-02-07: 5 mg via ORAL
  Filled 2015-02-07: qty 1

## 2015-02-07 NOTE — Progress Notes (Signed)
ANTICOAGULATION CONSULT NOTE - Follow Up Consult  Pharmacy Consult for Heparin  Indication: hx of DVT  Allergies  Allergen Reactions  . Propoxyphene N-Acetaminophen Swelling    Patient Measurements: Height: 5' 4.8" (164.6 cm) Weight: 176 lb 5.9 oz (80 kg) IBW/kg (Calculated) : 56.54  Vital Signs: Temp: 98.9 F (37.2 C) (02/08 1932) Temp Source: Oral (02/08 1932) BP: 139/77 mmHg (02/08 1932) Pulse Rate: 94 (02/08 1932)  Labs:  Recent Labs  02/06/15 1026 02/07/15 0336  HGB 14.4 13.8  HCT 41.3 39.9  PLT 212 213  LABPROT 14.9 15.4*  INR 1.16 1.21  HEPARINUNFRC  --  <0.10*  CREATININE 0.75  --     Estimated Creatinine Clearance: 85.6 mL/min (by C-G formula based on Cr of 0.75).  Assessment: Sub-therapeutic heparin level, no issues per RN.   Goal of Therapy:  Heparin level 0.3-0.7 units/ml Monitor platelets by anticoagulation protocol: Yes   Plan:  -Heparin 2000 units BOLUS -Increase heparin drip to 1300 units/hr -1200 HL -Daily CBC/HL -Monitor for bleeding  Narda Bonds 02/07/2015,4:37 AM

## 2015-02-07 NOTE — Progress Notes (Signed)
I was notified by RN at approximately 7 pm that Ms Bosques has agreed to stay. I will remove discharge orders at this time.  [name], MD 02/07/15 7:04 pm

## 2015-02-07 NOTE — Progress Notes (Signed)
ANTICOAGULATION CONSULT NOTE - Initial Consult  Pharmacy Consult for coumadin and heparin Indication: hx of bilateral DVT  Allergies  Allergen Reactions  . Propoxyphene N-Acetaminophen Swelling    Patient Measurements: Height: 5' 4.8" (164.6 cm) Weight: 176 lb 5.9 oz (80 kg) IBW/kg (Calculated) : 56.54 Heparin Dosing Weight: 73.5 kg  Vital Signs: Temp: 99.4 F (37.4 C) (02/09 1327) Temp Source: Oral (02/09 1327) BP: 140/78 mmHg (02/09 1327) Pulse Rate: 84 (02/09 1327)  Labs:  Recent Labs  02/06/15 1026 02/07/15 0336 02/07/15 1250 02/07/15 2055  HGB 14.4 13.8  --   --   HCT 41.3 39.9  --   --   PLT 212 213  --   --   LABPROT 14.9 15.4*  --   --   INR 1.16 1.21  --   --   HEPARINUNFRC  --  <0.10* 0.13* 0.20*  CREATININE 0.75 0.80  --   --     Estimated Creatinine Clearance: 85.6 mL/min (by C-G formula based on Cr of 0.8).   Medical History: Past Medical History  Diagnosis Date  . DVT (deep venous thrombosis)     bilateral, 2 episodes: Requires lifelong therapy  . Chronic pain syndrome   . Restless leg syndrome   . Depression   . Hyperlipidemia   . Hypertension   . PVD (peripheral vascular disease)     s/p L fem-pop bypass 11/21/08; graft occluded 12/28/08;  aortogram w/ bilat LE runoff: 1.  bilat diffuse SFA occlusive dz, 2.  Mod to severe above-knee popliteal dz,  3.  Bilat 3-vessel runoff w/ mild tibial occlusive dz.  . Tobacco abuse   . Degenerative joint disease of knee   . Breast lump 02/2008    Biopsy 05/2008: showed no evidence of malignancy  . Irregular menses 9/08  . Pulmonary edema   . Anxiety   . Joint pain   . Mixed stress and urge urinary incontinence     [facility] by [facility], underwent cystoscopy & uroflowmetry   . Endometrial mass 10/12/2012    Endometrium, biopsy on 11/04/12 - PROLIFERATIVE ENDOMETRIUM AND ABUNDANT MUCUS. NO HYPERPLASIA OR CARCINOMA.   . Sigmoid diverticulitis 10/26/2012  . Asthma   . Breast mass in female      bi lat     Medications:  Scheduled:  . aspirin EC  81 mg Oral Daily  . atorvastatin  40 mg Oral QHS  . buPROPion  300 mg Oral Daily  . carvedilol  25 mg Oral BID WC  . ciprofloxacin  400 mg Intravenous Q12H  . diazepam  10 mg Oral TID  . DULoxetine  60 mg Oral Daily  . feeding supplement (RESOURCE BREEZE)  1 Container Oral BID BM  . gabapentin  800 mg Oral BID  . hydrochlorothiazide  12.5 mg Oral Daily  . lisinopril  20 mg Oral BID  . metronidazole  500 mg Intravenous Q8H  . potassium chloride  10 mEq Oral Daily  . Warfarin - Pharmacist Dosing Inpatient   Does not apply q1800  . ziprasidone  60 mg Oral BID WC   Infusions:  . heparin 1,500 Units/hr (02/07/15 1409)    Assessment: 54 yo female with hx of bilateral DVT will be continued on coumadin but will also be bridged with heparin.  CBC stable, hgb 13.8, plts wnl.  INR 1.2 today.  Patient was on coumadin 7.5 mg alternating with 3.75 mg daily.  Last dose was last week.  Now on metronidazole -- expect  fluctuations in INR. HL SUBtherapeutic today.  Follow-up HL is subtherapeutic at 0.2 on heparin 1500 units/hr. RN reports no issues with line and no bleeding.   Goal of Therapy:  Heparin level 0.3-0.7 units/ml; INR 2-3 Monitor platelets by anticoagulation protocol: Yes   Plan:  - Heparin 1000 unit bolus followed by - Increase heparin to 1650 units/hr - warfarin 5 mg po x1  - 6hr heparin level  - daily heparin level, CBC, INR  Andrey Cota. Diona Foley, PharmD Clinical Pharmacist Pager [phone]  02/07/2015 9:51 PM

## 2015-02-07 NOTE — Progress Notes (Signed)
Patient requesting to leave AMA stating "I have family a emergency that only I can fix." RN educated patient about risk of leaving AMA. MD Ethelene Hal notified and spoke to patient via telephone. RN re-educated patient about risk of leaving AMA.  Patient then agreed to stay for CT of abdomen only. MD Ethelene Hal updated about patients decision.

## 2015-02-07 NOTE — Progress Notes (Signed)
I received a call from the RN at approximately 4:10 pm that [name] is planning to leave AMA. I spoke to the patient who is adamant that she has a personal situation at home that she needs to address. She expressed worry about leaving to deal with this but said it is something she has to do. She refused to tell me or the RN what her personal issue is. I told her we have social workers in the hospital who may help. I told her we can contact police if that would help. She refused any resources, said this is something only she can do, but refused again to tell us what was wrong. I asked if she would at least wait to get a prescription for antibiotics, but if she has an abscess, that would need to be drained and antibiotics may not help. I explained we would not know this until she had the new CT with oral contrast. She still insists on leaving.  The patient has decided to leave against medical advice. The patient has a normal mental status, and understands the risks of leaving, including permanent disability and/or death, and has had an opportunity to ask questions about her medical condition. The patient has been informed that she may return for care at any time, and follow up will be arranged. We will provide prescription for 14 day course of ciprofloxacin and flagyl.  [name], MD 02/07/15 4:22 pm

## 2015-02-07 NOTE — Progress Notes (Signed)
ANTICOAGULATION CONSULT NOTE - Initial Consult  Pharmacy Consult for coumadin and heparin Indication: hx of bilateral DVT  Allergies  Allergen Reactions  . Propoxyphene N-Acetaminophen Swelling    Patient Measurements: Height: 5' 4.8" (164.6 cm) Weight: 176 lb 5.9 oz (80 kg) IBW/kg (Calculated) : 56.54 Heparin Dosing Weight: 73.5 kg  Vital Signs: Temp: 99.4 F (37.4 C) (02/09 1327) Temp Source: Oral (02/09 1327) BP: 140/78 mmHg (02/09 1327) Pulse Rate: 84 (02/09 1327)  Labs:  Recent Labs  02/06/15 1026 02/07/15 0336 02/07/15 1250  HGB 14.4 13.8  --   HCT 41.3 39.9  --   PLT 212 213  --   LABPROT 14.9 15.4*  --   INR 1.16 1.21  --   HEPARINUNFRC  --  <0.10* 0.13*  CREATININE 0.75 0.80  --     Estimated Creatinine Clearance: 85.6 mL/min (by C-G formula based on Cr of 0.8).   Medical History: Past Medical History  Diagnosis Date  . DVT (deep venous thrombosis)     bilateral, 2 episodes: Requires lifelong therapy  . Chronic pain syndrome   . Restless leg syndrome   . Depression   . Hyperlipidemia   . Hypertension   . PVD (peripheral vascular disease)     s/p L fem-pop bypass 11/21/08; graft occluded 12/28/08;  aortogram w/ bilat LE runoff: 1.  bilat diffuse SFA occlusive dz, 2.  Mod to severe above-knee popliteal dz,  3.  Bilat 3-vessel runoff w/ mild tibial occlusive dz.  . Tobacco abuse   . Degenerative joint disease of knee   . Breast lump 02/2008    Biopsy 05/2008: showed no evidence of malignancy  . Irregular menses 9/08  . Pulmonary edema   . Anxiety   . Joint pain   . Mixed stress and urge urinary incontinence     [facility], underwent cystoscopy & uroflowmetry   . Endometrial mass 10/12/2012    Endometrium, biopsy on 11/04/12 - PROLIFERATIVE ENDOMETRIUM AND ABUNDANT MUCUS. NO HYPERPLASIA OR CARCINOMA.   . Sigmoid diverticulitis 10/26/2012  . Asthma   . Breast mass in female     bi lat     Medications:  Scheduled:  .  aspirin EC  81 mg Oral Daily  . atorvastatin  40 mg Oral QHS  . buPROPion  300 mg Oral Daily  . carvedilol  25 mg Oral BID WC  . ciprofloxacin  400 mg Intravenous Q12H  . diazepam  10 mg Oral TID  . DULoxetine  60 mg Oral Daily  . feeding supplement (RESOURCE BREEZE)  1 Container Oral BID BM  . gabapentin  800 mg Oral BID  . hydrochlorothiazide  12.5 mg Oral Daily  . lisinopril  20 mg Oral BID  . metronidazole  500 mg Intravenous Q8H  . potassium chloride  10 mEq Oral Daily  . potassium chloride  40 mEq Oral Once  . Warfarin - Pharmacist Dosing Inpatient   Does not apply q1800  . ziprasidone  60 mg Oral BID WC   Infusions:  . heparin 1,300 Units/hr (02/07/15 3664)    Assessment: 54 yo female with hx of bilateral DVT will be continued on coumadin but will also be bridged with heparin.  CBC stable, hgb 13.8, plts wnl.  INR 1.2 today.  Patient was on coumadin 7.5 mg alternating with 3.75 mg daily.  Last dose was last week.  Now on metronidazole -- expect fluctuations in INR. HL SUBtherapeutic today.   Goal of Therapy:  Heparin level 0.3-0.7 units/ml; INR 2-3 Monitor platelets by anticoagulation protocol: Yes   Plan:  - warfarin 5 mg po x1  - Increase heparin gtt to 1500 units/hr - 6hr heparin level  - daily heparin level, CBC, INR    Hughes Better, PharmD, BCPS Clinical Pharmacist Pager: [phone] 02/07/2015 1:46 PM

## 2015-02-07 NOTE — Progress Notes (Signed)
Subjective: [name]. Alisha [name] reports that her symptoms have begun to improve. She now rates the lower abdominal pain as a 6/10 but says it still travels to her flanks. She says that her dysuria has resolved as well as her nausea and emesis.  Objective: Vital signs in last 24 hours: Filed Vitals:   02/07/15 0552 02/07/15 0746 02/07/15 1016 02/07/15 1327  BP: 163/83 164/93 107/63 140/78  Pulse:    84  Temp: 99.6 F (37.6 C)   99.4 F (37.4 C)  TempSrc: Oral   Oral  Resp: 18   18  Height:      Weight:      SpO2: 92%   97%   Weight change:   Intake/Output Summary (Last 24 hours) at 02/07/15 1338 Last data filed at 02/07/15 0916  Gross per 24 hour  Intake    300 ml  Output    300 ml  Net      0 ml   Gen: A&O x 4, no acute distress, well developed, well nourished HEENT: Atraumatic, PERRL, EOMI, sclerae anicteric, moist mucous membranes Heart: Regular rate and rhythm, normal S1 S2, no murmurs, rubs, or gallops Lungs: Clear to auscultation bilaterally, respirations unlabored Abd: Soft, diffuse tenderness on palpation worst across lower abdomen, CVA tenderness b/l, non-distended, + bowel sounds, no hepatosplenomegaly Ext: No edema or cyanosis  Lab Results: Basic Metabolic Panel:  Recent Labs Lab 02/06/15 1026 02/07/15 0336  NA 138 137  K 3.9 3.2*  CL 104 101  CO2 21 24  GLUCOSE 109* 102*  BUN 9 6  CREATININE 0.75 0.80  CALCIUM 9.0 9.1   Liver Function Tests:  Recent Labs Lab 02/06/15 1026  AST 20  ALT 16  ALKPHOS 72  BILITOT 1.5*  PROT 7.2  ALBUMIN 3.1*    Recent Labs Lab 02/06/15 1026  LIPASE 21   CBC:  Recent Labs Lab 02/06/15 1026 02/07/15 0336  WBC 16.6* 17.6*  NEUTROABS 13.1*  --   HGB 14.4 13.8  HCT 41.3 39.9  MCV 94.5 94.1  PLT 212 213   Coagulation:  Recent Labs Lab 02/06/15 1026 02/07/15 0336  LABPROT 14.9 15.4*  INR 1.16 1.21  Urinalysis:  Recent Labs Lab 02/06/15 1159  COLORURINE AMBER*  LABSPEC 1.040*  PHURINE 6.0    GLUCOSEU NEGATIVE  HGBUR NEGATIVE  BILIRUBINUR LARGE*  KETONESUR 40*  PROTEINUR >300*  UROBILINOGEN 1.0  NITRITE NEGATIVE  LEUKOCYTESUR SMALL*  many bacteria, many squamous  Pregnancy test negative  Micro Results: No results found for this or any previous visit (from the past 240 hour(s)). Studies/Results: Ct Angio Ao+bifem W/cm &/or Wo/cm  02/06/2015   CLINICAL DATA:  Back pain, nausea and fever. History of peripheral vascular disease with known chronic SFA occlusions and right external iliac artery stenosis.  EXAM: CT ANGIOGRAPHY OF ABDOMINAL AORTA WITH ILIOFEMORAL RUNOFF  TECHNIQUE: Multidetector CT imaging of the abdomen, pelvis and lower extremities was performed using the standard protocol during bolus administration of intravenous contrast. Multiplanar CT image reconstructions and MIPs were obtained to evaluate the vascular anatomy.  CONTRAST:  121mL OMNIPAQUE IOHEXOL 350 MG/ML SOLN  COMPARISON:  10/03/2008  FINDINGS: Aorta: The abdominal aorta is normally patent and demonstrates mild plaque. No evidence of aortic aneurysm or stenosis. The celiac axis, superior mesenteric artery and inferior mesenteric artery show stable and normal patency. Midportion of the left renal artery demonstrates stable moderate focal stenosis of approximately 50% narrowing. Mild progression of plaque in the proximal right renal artery without  significant stenosis identified.  Right Lower Extremity: Calcified plaque present in the common iliac artery without significant stenosis. Previously identified focal stenosis of the mid right external iliac artery has been stented with a widely patent stent present. The common femoral artery shows distal stenosis of approximately 50%. The profunda femoral artery is normally patent. The SFA is chronically occluded at its origin. Distal SFA is reconstituted by collaterals at the adductor hiatus. The popliteal artery is normally patent. Three-vessel runoff is demonstrated into  the foot with dominant anterior tibial runoff.  Left Lower Extremity: Calcified plaque is present in the common and external iliac arteries without significant stenosis identified. Distal common femoral artery becomes of larger caliber, likely related to prior patch angioplasty. The native SFA is chronically occluded. The profunda femoral artery is open. The popliteal artery is reconstituted above the knee. Peroneal posterior tibial arteries are open into the foot. The anterior tibial artery is open but poorly opacified and diseased with calcified plaque present.  Arterial phase evaluation of the solid organs is unremarkable including the liver, gallbladder, pancreas, spleen, adrenal glands and kidneys.  There is thickening and inflammation involving the sigmoid colon. Lumen is difficult to follow in the pelvis and there may be some developing areas of abscess formation. Findings are most likely consistent with diverticulitis. Mass cannot be excluded. Follow-up imaging with CT of the abdomen and pelvis during venous phase contrast opacification and with oral contrast may be helpful. No free intraperitoneal air is identified. No enlarged lymph nodes are seen.  Review of the MIP images confirms the above findings.  IMPRESSION: 1. Evidence of probable diverticulitis involving the sigmoid colon. There may be developing adjacent diverticular abscess is predominantly posterior and inferior to the sigmoid colon. This is difficult to delineate without oral contrast on poor and during the arterial phase CTA. Mass of the colon cannot be excluded on the current study. Follow-up with venous phase CT of the abdomen and pelvis with oral contrast would be helpful. 2. 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 3. Chronic bilateral SFA occlusions.   Electronically Signed   By: Aletta Edouard M.D.   On: 02/06/2015 17:19   Medications: I have reviewed the patient's current medications. Scheduled Meds: . aspirin EC   81 mg Oral Daily  . atorvastatin  40 mg Oral QHS  . buPROPion  300 mg Oral Daily  . carvedilol  25 mg Oral BID WC  . ciprofloxacin  400 mg Intravenous Q12H  . diazepam  10 mg Oral TID  . DULoxetine  60 mg Oral Daily  . feeding supplement (RESOURCE BREEZE)  1 Container Oral BID BM  . gabapentin  800 mg Oral BID  . hydrochlorothiazide  12.5 mg Oral Daily  . lisinopril  20 mg Oral BID  . metronidazole  500 mg Intravenous Q8H  . potassium chloride  10 mEq Oral Daily  . potassium chloride  40 mEq Oral Once  . Warfarin - Pharmacist Dosing Inpatient   Does not apply q1800  . ziprasidone  60 mg Oral BID WC   Continuous Infusions: . heparin 1,300 Units/hr (02/07/15 0512)   PRN Meds:.HYDROmorphone (DILAUDID) injection, hydrOXYzine Assessment/Plan: Principal Problem:   Diverticulitis Active Problems:   Chronic pain syndrome   Long term current use of anticoagulant   HTN (hypertension)   UTI (lower urinary tract infection)   #Diverticulitis: Alisha [name] has evidence of sigmoid diverticulitis on CTA which explains her abdominal pain. She is on cipro/flagyl and reports symptomatic improvement overnight  but her WBC was slightly higher this morning at 17.6 from 16.6 on presentation. The CT could not differentiate if there was an abscess adjacent to the sigmoid and recommended oral contrast imaging. -CT abd pelvis with oral contrast -cpirofloxacin 400 mg iv q12h -metronidazole 500 mg iv q8h -dilaudid 1 mg iv q4hprn -zofran 4 mg iv q8hprn -clear liquid diet  #UTI: Alisha [name] complained of dysuria on presentation with many bacteria on UA as well as flank pain but normal kidneys on CT. She is on cipro/flagyl for diverticulitis and the cipro will cover for possible UTI -ciprofloxacin 400 mg iv q12h  #CHF: Echo 2007 45% EF per EMR. At home she is on ASA 81 mg daily, carvedilol 25 mg bid, lisinopril 20 mg bid, KDur 10 mEq -cont ASA 81 mg daily, carvedilol 25 mg bid, lisinopril 20 mg bid  #CAD -  02/10/14 stress test low risk but anterior abn, Cath on 02/25/14 nonobstructive CAD, normal LV function 55-65%.  At home she is on ASA 81 mg daily, carvedilol 25 mg bid, lisinopril 20 mg bid, atorvastatin 40 mg daily. -cont ASA 81 mg daily, carvedilol 25 mg bid, lisinopril 20 mg bid, atorvastatin 40 mg daily  #HTN: BP 100s-160s/60s-90s this morning. At home she is on ASA 81 mg daily, carvedilol 25 mg bid, lisinopril 20 mg bid, HCTZ 12.5 mg daily, atorvastatin 40 mg daily. 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 She was subtherapeutic with INR 1.21 on presentation. She was started on heparin drip to bridge again to warfarin. -cont warfarin, heparin per pharmacy  #HLD: Last lipid profile 10/01/2011 w cholesterol 162, triglyceride 205, HDL 25, LDL 96. At home she is on atorvastatin 40 mg daily. -cont atorvastatin 40 mg daily  Depression - cont wellbutrin 300 mg daily, ziprasidone 60 mg bid, cymbalta 60 mg daily, valium 10 mg tid.   Dispo: Disposition is deferred at this time, awaiting improvement of current medical problems.  Anticipated discharge in approximately 2 day(s).   The patient does have a current PCP Julious Oka, MD) and does need an [facility] follow-up appointment after discharge.  The patient does not know have transportation limitations that hinder transportation to clinic appointments.  .Services Needed at time of discharge: Y = Yes, Blank = No PT:   OT:   RN:   Equipment:   Other:     LOS: 1 day   Kelby Aline, MD 02/07/2015, 1:38 PM

## 2015-02-07 NOTE — Progress Notes (Signed)
Utilization review complete. Teila Skalsky RN CCM Case Mgmt phone [phone] 

## 2015-02-07 NOTE — Progress Notes (Signed)
INITIAL NUTRITION ASSESSMENT  DOCUMENTATION CODES Per approved criteria  -Not Applicable   INTERVENTION: -Resource Breeze BID providing 250 kcal and 9 g protein  NUTRITION DIAGNOSIS: Inadequate oral intake related to increased nutrient needs as evidenced by estimated needs.   Goal: Pt to meet >/= 90% of estimated needs  Monitor:  PO intake, supplement acceptance, weight trends, labs  Reason for Assessment: MST = 2  54 y.o. female  Admitting Dx: Diverticulitis  ASSESSMENT: Pt admitted with abdominal pain, back pain, fever, and urinary discomfort.  Hx of DVT on warfarin, chronic leg pain, HLD, HTN, PVD and sigmoid diverticulitis (2013).  Pain started 4 days PTA.  Pt experiencing nausea and vomiting. Pt found to have diverticulitis and UTI  Pt in pain and unable to fully communicate.  Pt reports no loss in appetite.  Says usual body weight is 171 lbs and last weight 171 lbs one month ago.  Per weight records pt has lost 13% of body weight in past month (significant for time frame).  Pt consumed about 50% of breakfast tray.  States she is hungry but the jello and juice was too sweet for her.  Willing to try resource breeze, advised to mix with ginger ale if too sweet for pt.  Nutrition Focused Physical Exam:  Pt well nourished, no signs of fat or muscle wasting    Height: Ht Readings from Last 1 Encounters:  02/06/15 5' 4.8" (1.646 m)    Weight: Wt Readings from Last 1 Encounters:  02/06/15 176 lb 5.9 oz (80 kg)    Ideal Body Weight: 124 lbs  % Ideal Body Weight: 137%  Wt Readings from Last 10 Encounters:  02/06/15 176 lb 5.9 oz (80 kg)  08/29/14 196 lb 13.9 oz (89.3 kg)  08/11/14 204 lb (92.534 kg)  07/13/14 203 lb (92.08 kg)  07/08/14 203 lb (92.08 kg)  07/08/14 203 lb 12.8 oz (92.443 kg)  06/23/14 206 lb 11.2 oz (93.759 kg)  06/06/14 180 lb (81.647 kg)  05/26/14 204 lb 3.2 oz (92.625 kg)  05/03/14 196 lb 2 oz (88.962 kg)   Usual Body Weight: 171 lbss  %  Usual Body Weight: 103%  BMI:  Body mass index is 29.53 kg/(m^2).  Estimated Nutritional Needs: Kcal: 1800-2000 kcals Protein: 95-110 g protein Fluid: >/= 1.8 L/day  Skin: incision on left and right breast  Diet Order: Diet clear liquid  EDUCATION NEEDS: -No education needs identified at this time   Intake/Output Summary (Last 24 hours) at 02/07/15 1001 Last data filed at 02/07/15 0916  Gross per 24 hour  Intake    300 ml  Output    300 ml  Net      0 ml    Last BM: PTA   Labs:   Recent Labs Lab 02/06/15 1026 02/07/15 0336  NA 138 137  K 3.9 3.2*  CL 104 101  CO2 21 24  BUN 9 6  CREATININE 0.75 0.80  CALCIUM 9.0 9.1  GLUCOSE 109* 102*    CBG (last 3)  No results for input(s): GLUCAP in the last 72 hours.  Scheduled Meds: . aspirin EC  81 mg Oral Daily  . atorvastatin  40 mg Oral QHS  . buPROPion  300 mg Oral Daily  . carvedilol  25 mg Oral BID WC  . ciprofloxacin  400 mg Intravenous Q12H  . diazepam  10 mg Oral TID  . DULoxetine  60 mg Oral Daily  . gabapentin  800 mg Oral BID  .  hydrochlorothiazide  12.5 mg Oral Daily  . lisinopril  20 mg Oral BID  . metronidazole  500 mg Intravenous Q8H  . potassium chloride  10 mEq Oral Daily  . Warfarin - Pharmacist Dosing Inpatient   Does not apply q1800  . ziprasidone  60 mg Oral BID WC    Continuous Infusions: . heparin 1,300 Units/hr (02/07/15 9480)    Past Medical History  Diagnosis Date  . DVT (deep venous thrombosis)     bilateral, 2 episodes: Requires lifelong therapy  . Chronic pain syndrome   . Restless leg syndrome   . Depression   . Hyperlipidemia   . Hypertension   . PVD (peripheral vascular disease)     s/p L fem-pop bypass 11/21/08; graft occluded 12/28/08;  aortogram w/ bilat LE runoff: 1.  bilat diffuse SFA occlusive dz, 2.  Mod to severe above-knee popliteal dz,  3.  Bilat 3-vessel runoff w/ mild tibial occlusive dz.  . Tobacco abuse   . Degenerative joint disease of knee   .  Breast lump 02/2008    Biopsy 05/2008: showed no evidence of malignancy  . Irregular menses 9/08  . Pulmonary edema   . Anxiety   . Joint pain   . Mixed stress and urge urinary incontinence     Being followed by [facility], underwent cystoscopy & uroflowmetry   . Endometrial mass 10/12/2012    Endometrium, biopsy on 11/04/12 - PROLIFERATIVE ENDOMETRIUM AND ABUNDANT MUCUS. NO HYPERPLASIA OR CARCINOMA.   . Sigmoid diverticulitis 10/26/2012  . Asthma   . Breast mass in female     bi lat     Past Surgical History  Procedure Laterality Date  . Femoral bypass  11/09  .  right external iliac artery stent    . Right breast needle-localized lumpectomy    . Pr vein bypass graft,aorto-fem-pop    . Carotid endarterectomy    . Tubal ligation    . Tonsillectomy    . Cardiac catheterization  2/15  . Breast lumpectomy with radioactive seed localization Bilateral 05/03/2014    Procedure: BREAST LUMPECTOMY WITH RADIOACTIVE SEED LOCALIZATION;  Surgeon: Joyice Faster. Cornett, MD;  Location: [facility];  Service: General;  Laterality: Bilateral;  . Left heart catheterization with coronary angiogram N/A 02/25/2014    Procedure: LEFT HEART CATHETERIZATION WITH CORONARY ANGIOGRAM;  Surgeon: Peter M Martinique, MD;  Location: [facility] CATH LAB;  Service: Cardiovascular;  Laterality: N/A;    Elmer Picker MS Dietetic Intern Pager Number [phone]

## 2015-02-08 ENCOUNTER — Encounter (HOSPITAL_COMMUNITY): Payer: Self-pay | Admitting: General Surgery

## 2015-02-08 DIAGNOSIS — K5700 Diverticulitis of small intestine with perforation and abscess without bleeding: Secondary | ICD-10-CM

## 2015-02-08 DIAGNOSIS — K5720 Diverticulitis of large intestine with perforation and abscess without bleeding: Secondary | ICD-10-CM | POA: Insufficient documentation

## 2015-02-08 LAB — CBC
HCT: 39.7 % (ref 36.0–46.0)
Hemoglobin: 13.2 g/dL (ref 12.0–15.0)
MCH: 32.4 pg (ref 26.0–34.0)
MCHC: 33.2 g/dL (ref 30.0–36.0)
MCV: 97.5 fL (ref 78.0–100.0)
Platelets: 206 10*3/uL (ref 150–400)
RBC: 4.07 MIL/uL (ref 3.87–5.11)
RDW: 13.1 % (ref 11.5–15.5)
WBC: 15.2 10*3/uL — ABNORMAL HIGH (ref 4.0–10.5)

## 2015-02-08 LAB — URINE CULTURE

## 2015-02-08 LAB — BASIC METABOLIC PANEL
Anion gap: 12 (ref 5–15)
BUN: 6 mg/dL (ref 6–23)
CALCIUM: 8.8 mg/dL (ref 8.4–10.5)
CHLORIDE: 103 mmol/L (ref 96–112)
CO2: 23 mmol/L (ref 19–32)
CREATININE: 0.74 mg/dL (ref 0.50–1.10)
Glucose, Bld: 86 mg/dL (ref 70–99)
Potassium: 3.6 mmol/L (ref 3.5–5.1)
Sodium: 138 mmol/L (ref 135–145)

## 2015-02-08 LAB — PROTIME-INR
INR: 1.48 (ref 0.00–1.49)
Prothrombin Time: 18.1 seconds — ABNORMAL HIGH (ref 11.6–15.2)

## 2015-02-08 LAB — HEPARIN LEVEL (UNFRACTIONATED)
Heparin Unfractionated: 0.22 IU/mL — ABNORMAL LOW (ref 0.30–0.70)
Heparin Unfractionated: 0.51 IU/mL (ref 0.30–0.70)

## 2015-02-08 MED ORDER — HEPARIN (PORCINE) IN NACL 100-0.45 UNIT/ML-% IJ SOLN
1800.0000 [IU]/h | INTRAMUSCULAR | Status: AC
  Administered 2015-02-08: 1800 [IU]/h via INTRAVENOUS
  Filled 2015-02-08 (×2): qty 250

## 2015-02-08 MED ORDER — WARFARIN SODIUM 5 MG PO TABS
5.0000 mg | ORAL_TABLET | Freq: Once | ORAL | Status: DC
  Filled 2015-02-08: qty 1

## 2015-02-08 MED ORDER — HEPARIN (PORCINE) IN NACL 100-0.45 UNIT/ML-% IJ SOLN
1800.0000 [IU]/h | INTRAMUSCULAR | Status: DC
  Filled 2015-02-08: qty 250

## 2015-02-08 MED ORDER — WARFARIN - PHARMACIST DOSING INPATIENT: Freq: Every day | Status: DC

## 2015-02-08 NOTE — Consult Note (Signed)
Alisha [name] [birth year]  [id number].   Requesting MD: Dr. [name] Chief Complaint/Reason for Consult: diverticulitis with abscess HPI: This is a 54 yo black female with multiple medical problems who began having what she thought was gas pains on Friday.  They continued to persist. She then developed some nausea and vomiting on Sunday.  She states she was having fevers as high as 103 at home.  She denies diarrhea or blood in her emesis or stool.  Her abdominal pain persisted.  She finally presented to [facility] where she had a CT to look at her ao-bifem graft.  She has an occlusion of her SFA and diverticulitis, but could not clearly see an abscess.  She was admitted.  She was also found to have a possible UTI.  She denies pneumaturia.  Several days later, her pain still persisted.  She had a normal CT A/P with contrast and was found to have peristent diverticulitis with a 7.8 x 4.6 cm abscess adjacent to the sigmoid colon.  She wascontinued on Cipro and Flagyl.  She has never had this before and had a colonoscopy about a year ago she thinks.  We have been asked to see her for further recommendations.  ROS  : Please see HPI, otherwise negative, except for chronic problems with her left leg due to a failed aorto-bifem bypass  Family History  Problem Relation Age of Onset  . Breast cancer Mother   . Colon cancer Neg Hx   . Rectal cancer Neg Hx   . Stomach cancer Neg Hx   . Breast cancer Other     GRE[employer]T    Past Medical History  Diagnosis Date  . DVT (deep venous thrombosis)     bilateral, 2 episodes: Requires lifelong therapy  . Chronic pain syndrome   . Restless leg syndrome   . Depression   . Hyperlipidemia   . Hypertension   . PVD (peripheral vascular disease)     s/p L fem-pop bypass 11/21/08; graft occluded 12/28/08;  aortogram w/ bilat LE runoff: 1.  bilat diffuse SFA occlusive dz, 2.  Mod to severe above-knee popliteal dz,  3.  Bilat 3-vessel runoff w/ mild tibial occlusive dz.   . Tobacco abuse   . Degenerative joint disease of knee   . Breast lump 02/2008    Biopsy 05/2008: showed no evidence of malignancy  . Irregular menses 9/08  . Pulmonary edema   . Anxiety   . Joint pain   . Mixed stress and urge urinary incontinence     Being followed by [facility], underwent cystoscopy & uroflowmetry   . Endometrial mass 10/12/2012    Endometrium, biopsy on 11/04/12 - PROLIFERATIVE ENDOMETRIUM AND ABUNDANT MUCUS. NO HYPERPLASIA OR CARCINOMA.   . Sigmoid diverticulitis 10/26/2012  . Asthma   . Breast mass in female     bi lat     Past Surgical History  Procedure Laterality Date  . Femoral bypass  11/09  .  right external iliac artery stent    . Right breast needle-localized lumpectomy    . Pr vein bypass graft,aorto-fem-pop    . Carotid endarterectomy    . Tubal ligation    . Tonsillectomy    . Cardiac catheterization  2/15  . Breast lumpectomy with radioactive seed localization Bilateral 05/03/2014    Procedure: BREAST LUMPECTOMY WITH RADIOACTIVE SEED LOCALIZATION;  Surgeon: Joyice Faster. Cornett, MD;  Location: [place];  Service: General;  Laterality: Bilateral;  . Left heart  catheterization with coronary angiogram N/A 02/25/2014    Procedure: LEFT HEART CATHETERIZATION WITH CORONARY ANGIOGRAM;  Surgeon: Peter M Martinique, MD;  Location: [facility] CATH LAB;  Service: Cardiovascular;  Laterality: N/A;    Social History:  reports that she has been smoking Cigarettes.  She has a 9 pack-year smoking history. She has never used smokeless tobacco. She reports that she drinks alcohol. She reports that she does not use illicit drugs.  Allergies:  Allergies  Allergen Reactions  . Propoxyphene N-Acetaminophen Swelling    Medications Prior to Admission  Medication Sig Dispense Refill  . aspirin EC 81 MG tablet Take 81 mg by mouth daily.    Marland Kitchen atorvastatin (LIPITOR) 40 MG tablet Take 1 tablet (40 mg total) by mouth at bedtime. 90 tablet 3  . buPROPion  (WELLBUTRIN XL) 300 MG 24 hr tablet Take 300 mg by mouth daily.    . carvedilol (COREG) 25 MG tablet Take 1 tablet (25 mg total) by mouth 2 (two) times daily with a meal. 180 tablet 3  . diazepam (VALIUM) 5 MG tablet Take 10 mg by mouth 3 (three) times daily.     . DULoxetine (CYMBALTA) 60 MG capsule Take 60 mg by mouth daily.     Marland Kitchen gabapentin (NEURONTIN) 800 MG tablet Take 1 tablet in the morning, 1 tablet midday and 2 tablets at bedtime 120 tablet 0  . hydrochlorothiazide (HYDRODIURIL) 12.5 MG tablet Take 1 tablet (12.5 mg total) by mouth daily. 90 tablet 1  . hydrOXYzine (ATARAX/VISTARIL) 25 MG tablet Take 25 mg by mouth 3 (three) times daily as needed for anxiety.    Marland Kitchen lisinopril (PRINIVIL,ZESTRIL) 20 MG tablet Take 1 tablet (20 mg total) by mouth 2 (two) times daily. 180 tablet 1  . Multiple Vitamins-Minerals (ONE-A-DAY 50 PLUS PO) Take 1 tablet by mouth daily.    . potassium chloride (K-DUR) 10 MEQ tablet TAKE 1 TABLET BY MOUTH EVERY DAY 30 tablet 10  . warfarin (COUMADIN) 7.5 MG tablet Take 3.75-7.5 mg by mouth daily. Take 3.50m alternating with 7.562mdaily    . ziprasidone (GEODON) 60 MG capsule Take 60 mg by mouth 2 (two) times daily with a meal.     . albuterol (PROVENTIL HFA;VENTOLIN HFA) 108 (90 BASE) MCG/ACT inhaler Inhale 1-2 puffs into the lungs every 4 (four) hours as needed for wheezing or shortness of breath. 3.7 g 2    Blood pressure 133/77, pulse 92, temperature 99.7 F (37.6 C), temperature source Oral, resp. rate 20, height 5' 4.8" (1.646 m), weight 176 lb 5.9 oz (80 kg), SpO2 93 %. Physical Exam: General:, WD, WN black female who is laying in bed in NAD HEENT: head is normocephalic, atraumatic.  Sclera are noninjected.  PERRL.  Ears and nose without any masses or lesions.  Mouth is pink and moist Heart: regular, rate, and rhythm.  Normal s1,s2. No obvious murmurs, gallops, or rubs noted.  Palpable radial and pedal pulses bilaterally Lungs: CTAB, no wheezes, rhonchi, or  rales noted.  Respiratory effort nonlabored Abd: soft, mildly tender in her LLQ, ND, +BS, no masses or organomegaly.  She has a small reducible umbilical hernia MS: all 4 extremities are symmetrical with no cyanosis, clubbing, or edema. Skin: warm and dry with no masses, lesions, or rashes Psych: A&Ox3 with an appropriate affect.    Results for orders placed or performed during the hospital encounter of 02/06/15 (from the past 48 hour(s))  CBC with Differential/Platelet     Status: Abnormal   Collection  Time: 02/06/15 10:26 AM  Result Value Ref Range   WBC 16.6 (H) 4.0 - 10.5 K/uL   RBC 4.37 3.87 - 5.11 MIL/uL   Hemoglobin 14.4 12.0 - 15.0 g/dL   HCT 41.3 36.0 - 46.0 %   MCV 94.5 78.0 - 100.0 fL   MCH 33.0 26.0 - 34.0 pg   MCHC 34.9 30.0 - 36.0 g/dL   RDW 12.6 11.5 - 15.5 %   Platelets 212 150 - 400 K/uL   Neutrophils Relative % 79 (H) 43 - 77 %   Neutro Abs 13.1 (H) 1.7 - 7.7 K/uL   Lymphocytes Relative 11 (L) 12 - 46 %   Lymphs Abs 1.8 0.7 - 4.0 K/uL   Monocytes Relative 10 3 - 12 %   Monocytes Absolute 1.6 (H) 0.1 - 1.0 K/uL   Eosinophils Relative 0 0 - 5 %   Eosinophils Absolute 0.1 0.0 - 0.7 K/uL   Basophils Relative 0 0 - 1 %   Basophils Absolute 0.0 0.0 - 0.1 K/uL  Comprehensive metabolic panel     Status: Abnormal   Collection Time: 02/06/15 10:26 AM  Result Value Ref Range   Sodium 138 135 - 145 mmol/L   Potassium 3.9 3.5 - 5.1 mmol/L   Chloride 104 96 - 112 mmol/L   CO2 21 19 - 32 mmol/L   Glucose, Bld 109 (H) 70 - 99 mg/dL   BUN 9 6 - 23 mg/dL   Creatinine, Ser 0.75 0.50 - 1.10 mg/dL   Calcium 9.0 8.4 - 10.5 mg/dL   Total Protein 7.2 6.0 - 8.3 g/dL   Albumin 3.1 (L) 3.5 - 5.2 g/dL   AST 20 0 - 37 U/L   ALT 16 0 - 35 U/L   Alkaline Phosphatase 72 39 - 117 U/L   Total Bilirubin 1.5 (H) 0.3 - 1.2 mg/dL   GFR calc non Af Amer >90 >90 mL/min   GFR calc Af Amer >90 >90 mL/min    Comment: (NOTE) The eGFR has been calculated using the CKD EPI equation. This  calculation has not been validated in all clinical situations. eGFR's persistently <90 mL/min signify possible Chronic Kidney Disease.    Anion gap 13 5 - 15  Lipase, blood     Status: None   Collection Time: 02/06/15 10:26 AM  Result Value Ref Range   Lipase 21 11 - 59 U/L  Protime-INR     Status: None   Collection Time: 02/06/15 10:26 AM  Result Value Ref Range   Prothrombin Time 14.9 11.6 - 15.2 seconds   INR 1.16 0.00 - 1.49  Culture, Urine     Status: None   Collection Time: 02/06/15 11:57 AM  Result Value Ref Range   Specimen Description URINE, CLEAN CATCH    Special Requests ADDED 2243    Colony Count      >=100,000 COLONIES/ML Performed at [facility]    Culture      Multiple bacterial morphotypes present, none predominant. Suggest appropriate recollection if clinically indicated. Performed at [facility]    Report Status 02/08/2015 FINAL   Urinalysis, Routine w reflex microscopic     Status: Abnormal   Collection Time: 02/06/15 11:59 AM  Result Value Ref Range   Color, Urine AMBER (A) YELLOW    Comment: BIOCHEMICALS MAY BE AFFECTED BY COLOR   APPearance CLOUDY (A) CLEAR   Specific Gravity, Urine 1.040 (H) 1.005 - 1.030   pH 6.0 5.0 - 8.0   Glucose,  UA NEGATIVE NEGATIVE mg/dL   Hgb urine dipstick NEGATIVE NEGATIVE   Bilirubin Urine LARGE (A) NEGATIVE   Ketones, ur 40 (A) NEGATIVE mg/dL   Protein, ur >300 (A) NEGATIVE mg/dL   Urobilinogen, UA 1.0 0.0 - 1.0 mg/dL   Nitrite NEGATIVE NEGATIVE   Leukocytes, UA SMALL (A) NEGATIVE  Urine microscopic-add on     Status: Abnormal   Collection Time: 02/06/15 11:59 AM  Result Value Ref Range   Squamous Epithelial / LPF MANY (A) RARE   WBC, UA 11-20 <3 WBC/hpf   RBC / HPF 0-2 <3 RBC/hpf   Bacteria, UA MANY (A) RARE   Urine-Other MUCOUS PRESENT   POC urine preg, ED (not at [facility])     Status: None   Collection Time: 02/06/15 12:04 PM  Result Value Ref Range   Preg Test, Ur NEGATIVE NEGATIVE    Comment:         THE SENSITIVITY OF THIS METHODOLOGY IS >24 mIU/mL   Basic metabolic panel     Status: Abnormal   Collection Time: 02/07/15  3:36 AM  Result Value Ref Range   Sodium 137 135 - 145 mmol/L   Potassium 3.2 (L) 3.5 - 5.1 mmol/L    Comment: DELTA CHECK NOTED   Chloride 101 96 - 112 mmol/L   CO2 24 19 - 32 mmol/L   Glucose, Bld 102 (H) 70 - 99 mg/dL   BUN 6 6 - 23 mg/dL   Creatinine, Ser 0.80 0.50 - 1.10 mg/dL   Calcium 9.1 8.4 - 10.5 mg/dL   GFR calc non Af Amer 83 (L) >90 mL/min   GFR calc Af Amer >90 >90 mL/min    Comment: (NOTE) The eGFR has been calculated using the CKD EPI equation. This calculation has not been validated in all clinical situations. eGFR's persistently <90 mL/min signify possible Chronic Kidney Disease.    Anion gap 12 5 - 15  Heparin level (unfractionated)     Status: Abnormal   Collection Time: 02/07/15  3:36 AM  Result Value Ref Range   Heparin Unfractionated <0.10 (L) 0.30 - 0.70 IU/mL    Comment:        IF HEPARIN RESULTS ARE BELOW EXPECTED VALUES, AND PATIENT DOSAGE HAS BEEN CONFIRMED, SUGGEST FOLLOW UP TESTING OF ANTITHROMBIN III LEVELS.   CBC     Status: Abnormal   Collection Time: 02/07/15  3:36 AM  Result Value Ref Range   WBC 17.6 (H) 4.0 - 10.5 K/uL   RBC 4.24 3.87 - 5.11 MIL/uL   Hemoglobin 13.8 12.0 - 15.0 g/dL   HCT 39.9 36.0 - 46.0 %   MCV 94.1 78.0 - 100.0 fL   MCH 32.5 26.0 - 34.0 pg   MCHC 34.6 30.0 - 36.0 g/dL   RDW 12.8 11.5 - 15.5 %   Platelets 213 150 - 400 K/uL  Protime-INR     Status: Abnormal   Collection Time: 02/07/15  3:36 AM  Result Value Ref Range   Prothrombin Time 15.4 (H) 11.6 - 15.2 seconds   INR 1.21 0.00 - 1.49  Heparin level (unfractionated)     Status: Abnormal   Collection Time: 02/07/15 12:50 PM  Result Value Ref Range   Heparin Unfractionated 0.13 (L) 0.30 - 0.70 IU/mL    Comment:        IF HEPARIN RESULTS ARE BELOW EXPECTED VALUES, AND PATIENT DOSAGE HAS BEEN CONFIRMED, SUGGEST FOLLOW UP  TESTING OF ANTITHROMBIN III LEVELS.   Heparin level (unfractionated)  Status: Abnormal   Collection Time: 02/07/15  8:55 PM  Result Value Ref Range   Heparin Unfractionated 0.20 (L) 0.30 - 0.70 IU/mL    Comment:        IF HEPARIN RESULTS ARE BELOW EXPECTED VALUES, AND PATIENT DOSAGE HAS BEEN CONFIRMED, SUGGEST FOLLOW UP TESTING OF ANTITHROMBIN III LEVELS.   CBC     Status: Abnormal   Collection Time: 02/08/15  8:21 AM  Result Value Ref Range   WBC 15.2 (H) 4.0 - 10.5 K/uL   RBC 4.07 3.87 - 5.11 MIL/uL   Hemoglobin 13.2 12.0 - 15.0 g/dL   HCT 39.7 36.0 - 46.0 %   MCV 97.5 78.0 - 100.0 fL   MCH 32.4 26.0 - 34.0 pg   MCHC 33.2 30.0 - 36.0 g/dL   RDW 13.1 11.5 - 15.5 %   Platelets 206 150 - 400 K/uL  Protime-INR     Status: Abnormal   Collection Time: 02/08/15  8:21 AM  Result Value Ref Range   Prothrombin Time 18.1 (H) 11.6 - 15.2 seconds   INR 1.48 0.00 - 0.10  Basic metabolic panel     Status: None   Collection Time: 02/08/15  8:21 AM  Result Value Ref Range   Sodium 138 135 - 145 mmol/L   Potassium 3.6 3.5 - 5.1 mmol/L   Chloride 103 96 - 112 mmol/L   CO2 23 19 - 32 mmol/L   Glucose, Bld 86 70 - 99 mg/dL   BUN 6 6 - 23 mg/dL   Creatinine, Ser 0.74 0.50 - 1.10 mg/dL   Calcium 8.8 8.4 - 10.5 mg/dL   GFR calc non Af Amer >90 >90 mL/min   GFR calc Af Amer >90 >90 mL/min    Comment: (NOTE) The eGFR has been calculated using the CKD EPI equation. This calculation has not been validated in all clinical situations. eGFR's persistently <90 mL/min signify possible Chronic Kidney Disease.    Anion gap 12 5 - 15  Heparin level (unfractionated)     Status: Abnormal   Collection Time: 02/08/15  8:21 AM  Result Value Ref Range   Heparin Unfractionated 0.22 (L) 0.30 - 0.70 IU/mL    Comment:        IF HEPARIN RESULTS ARE BELOW EXPECTED VALUES, AND PATIENT DOSAGE HAS BEEN CONFIRMED, SUGGEST FOLLOW UP TESTING OF ANTITHROMBIN III LEVELS.    Ct Abdomen Pelvis W  Contrast  02/07/2015   CLINICAL DATA:  Lower abdominal pain.  EXAM: CT ABDOMEN AND PELVIS WITH CONTRAST  TECHNIQUE: Multidetector CT imaging of the abdomen and pelvis was performed using the standard protocol following bolus administration of intravenous contrast.  CONTRAST:  185m OMNIPAQUE IOHEXOL 300 MG/ML  SOLN  COMPARISON:  CT scan of February 06, 2015.  FINDINGS: Visualized lung bases appear normal. No significant osseous abnormality is noted.  No gallstones are noted. The liver, spleen and pancreas appear normal. Adrenal glands appear normal. Small right renal cyst is noted. No hydronephrosis or renal obstruction is noted. Atherosclerotic calcifications of abdominal aorta are noted without aneurysm formation. The appendix appears normal. 7.8 x 4.6 cm complex fluid collection is seen inferior to inflamed portion of sigmoid colon consistent with diverticular abscess. There is no evidence of bowel obstruction. Urinary bladder appears normal. Calcified fibroid is noted in the uterus. Mild fat containing periumbilical hernia is noted with inflammatory stranding.  IMPRESSION: Diverticulitis is sigmoid colon is noted with adjacent 7.8 x 4.6 cm complex fluid collection consistent with diverticular abscess.  Mild fat containing periumbilical hernia is noted which may be inflamed.   Electronically Signed   By: Marijo Conception, M.D.   On: 02/07/2015 20:12   Ct Angio Ao+bifem W/cm &/or Wo/cm  02/06/2015   CLINICAL DATA:  Back pain, nausea and fever. History of peripheral vascular disease with known chronic SFA occlusions and right external iliac artery stenosis.  EXAM: CT ANGIOGRAPHY OF ABDOMINAL AORTA WITH ILIOFEMORAL RUNOFF  TECHNIQUE: Multidetector CT imaging of the abdomen, pelvis and lower extremities was performed using the standard protocol during bolus administration of intravenous contrast. Multiplanar CT image reconstructions and MIPs were obtained to evaluate the vascular anatomy.  CONTRAST:  184m OMNIPAQUE  IOHEXOL 350 MG/ML SOLN  COMPARISON:  10/03/2008  FINDINGS: Aorta: The abdominal aorta is normally patent and demonstrates mild plaque. No evidence of aortic aneurysm or stenosis. The celiac axis, superior mesenteric artery and inferior mesenteric artery show stable and normal patency. Midportion of the left renal artery demonstrates stable moderate focal stenosis of approximately 50% narrowing. Mild progression of plaque in the proximal right renal artery without significant stenosis identified.  Right Lower Extremity: Calcified plaque present in the common iliac artery without significant stenosis. Previously identified focal stenosis of the mid right external iliac artery has been stented with a widely patent stent present. The common femoral artery shows distal stenosis of approximately 50%. The profunda femoral artery is normally patent. The SFA is chronically occluded at its origin. Distal SFA is reconstituted by collaterals at the adductor hiatus. The popliteal artery is normally patent. Three-vessel runoff is demonstrated into the foot with dominant anterior tibial runoff.  Left Lower Extremity: Calcified plaque is present in the common and external iliac arteries without significant stenosis identified. Distal common femoral artery becomes of larger caliber, likely related to prior patch angioplasty. The native SFA is chronically occluded. The profunda femoral artery is open. The popliteal artery is reconstituted above the knee. Peroneal posterior tibial arteries are open into the foot. The anterior tibial artery is open but poorly opacified and diseased with calcified plaque present.  Arterial phase evaluation of the solid organs is unremarkable including the liver, gallbladder, pancreas, spleen, adrenal glands and kidneys.  There is thickening and inflammation involving the sigmoid colon. Lumen is difficult to follow in the pelvis and there may be some developing areas of abscess formation. Findings are  most likely consistent with diverticulitis. Mass cannot be excluded. Follow-up imaging with CT of the abdomen and pelvis during venous phase contrast opacification and with oral contrast may be helpful. No free intraperitoneal air is identified. No enlarged lymph nodes are seen.  Review of the MIP images confirms the above findings.  IMPRESSION: 1. Evidence of probable diverticulitis involving the sigmoid colon. There may be developing adjacent diverticular abscess is predominantly posterior and inferior to the sigmoid colon. This is difficult to delineate without oral contrast on poor and during the arterial phase CTA. Mass of the colon cannot be excluded on the current study. Follow-up with venous phase CT of the abdomen and pelvis with oral contrast would be helpful. 2. 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 3. Chronic bilateral SFA occlusions.   Electronically Signed   By: GAletta EdouardM.D.   On: 02/06/2015 17:19       Assessment/Plan 1. Sigmoid diverticulitis with large abscess 2. UTI 3. H/o 2 DVTs, subtherapeutic on coumadin at home 4. CHF 5. CAD 6. HTN 7. Depression  Plan: 1. I have d/w IR about the patient's CT  scan.  They will attempt drainage of this large diverticular abscess.  Her heparin and her coumadin will have to be held in order to proceed with this procedure.  I have thoroughly discussed the disease process along with our plans with the patient and her husband who seem to understand and are agreeable.  We will attempt conservative management and try to avoid an operation that may result in a colostomy if able.  This was also explained to the patient.  Continue abx therapy, if fails to improve, may need to change to rocephin and flagyl or zosyn.  We will follow closely.   Joy Reiger E 02/08/2015, 10:17 AM Pager: [phone]

## 2015-02-08 NOTE — Progress Notes (Signed)
I was paged by RN for Ms. Nims at 4:33PM stating that the patient wanted to leave AMA.  I spoke with the patient at length about the risk to health and life if she were to leave the hospital without intervention.  She said she needed to leave to take care of personal business at home but would not elaborate.  She did mention needing to be sure she had a home for her and her children.   I offered to ask the CSW to come by but the patient states that they would not be able to help.  I reiterated to the patient that it would be dangerous for her to leave the hospital but that she had the right to leave AMA.  She asked if we would still give her antibiotics and I said we would but that they would not treat the abscess that needs to be drained.  She continued to insist on leaving but eventually said she thinks she will stay.  The patient threatened to leave AMA around this same time yesterday but later decided to stay.  I have asked the RN to page if she decides to leave again.  Duwaine Maxin, DO IMTS, PGY2

## 2015-02-08 NOTE — Progress Notes (Signed)
Subjective: See prior documentation regarding Alisha [name] attempt to leave AMA last night. She did agree to stay this morning. We discussed the results of her CT w oral contrast and that she has an abscess that requires surgical intervention for complete treatment. She expressed understanding and said she agreed to stay for the duration required for complete treatment of her condition. She continues to report improvement in abdominal pain but was upset she still cannot tolerate po intake for several days. Dysuria still resolved.  Objective: Vital signs in last 24 hours: Filed Vitals:   02/07/15 2230 02/08/15 0000 02/08/15 0150 02/08/15 0558  BP: 127/76   133/77  Pulse: 87   92  Temp: 100.7 F (38.2 C) 99.8 F (37.7 C)  99.7 F (37.6 C)  TempSrc: Axillary Oral  Oral  Resp: 20  21 20   Height:      Weight:      SpO2: 96%   93%   Weight change:   Intake/Output Summary (Last 24 hours) at 02/08/15 0919 Last data filed at 02/07/15 1852  Gross per 24 hour  Intake    700 ml  Output    200 ml  Net    500 ml   Gen: A&O x 4, no acute distress, well developed, well nourished HEENT: Atraumatic, PERRL, EOMI, sclerae anicteric, moist mucous membranes Heart: Regular rate and rhythm, normal S1 S2, no murmurs, rubs, or gallops Lungs: Clear to auscultation bilaterally, respirations unlabored Abd: Soft, diffuse tenderness on palpation worst across lower abdomen, CVA tenderness b/l, non-distended, + bowel sounds, no hepatosplenomegaly Ext: No edema or cyanosis  Lab Results: Basic Metabolic Panel:  Recent Labs Lab 02/06/15 1026 02/07/15 0336  NA 138 137  K 3.9 3.2*  CL 104 101  CO2 21 24  GLUCOSE 109* 102*  BUN 9 6  CREATININE 0.75 0.80  CALCIUM 9.0 9.1   Liver Function Tests:  Recent Labs Lab 02/06/15 1026  AST 20  ALT 16  ALKPHOS 72  BILITOT 1.5*  PROT 7.2  ALBUMIN 3.1*    Recent Labs Lab 02/06/15 1026  LIPASE 21   CBC:  Recent Labs Lab 02/06/15 1026  02/07/15 0336  WBC 16.6* 17.6*  NEUTROABS 13.1*  --   HGB 14.4 13.8  HCT 41.3 39.9  MCV 94.5 94.1  PLT 212 213   Coagulation:  Recent Labs Lab 02/06/15 1026 02/07/15 0336  LABPROT 14.9 15.4*  INR 1.16 1.21  Urinalysis:  Recent Labs Lab 02/06/15 1159  COLORURINE AMBER*  LABSPEC 1.040*  PHURINE 6.0  GLUCOSEU NEGATIVE  HGBUR NEGATIVE  BILIRUBINUR LARGE*  KETONESUR 40*  PROTEINUR >300*  UROBILINOGEN 1.0  NITRITE NEGATIVE  LEUKOCYTESUR SMALL*  many bacteria, many squamous  Pregnancy test negative  Micro Results: Recent Results (from the past 240 hour(s))  Culture, Urine     Status: None   Collection Time: 02/06/15 11:57 AM  Result Value Ref Range Status   Specimen Description URINE, CLEAN CATCH  Final   Special Requests ADDED 2243  Final   Colony Count   Final    >=100,000 COLONIES/ML Performed at [facility]    Culture   Final    Multiple bacterial morphotypes present, none predominant. Suggest appropriate recollection if clinically indicated. Performed at [facility]    Report Status 02/08/2015 FINAL  Final   Studies/Results: Ct Abdomen Pelvis W Contrast  02/07/2015   CLINICAL DATA:  Lower abdominal pain.  EXAM: CT ABDOMEN AND PELVIS WITH CONTRAST  TECHNIQUE: Multidetector CT  imaging of the abdomen and pelvis was performed using the standard protocol following bolus administration of intravenous contrast.  CONTRAST:  155mL OMNIPAQUE IOHEXOL 300 MG/ML  SOLN  COMPARISON:  CT scan of February 06, 2015.  FINDINGS: Visualized lung bases appear normal. No significant osseous abnormality is noted.  No gallstones are noted. The liver, spleen and pancreas appear normal. Adrenal glands appear normal. Small right renal cyst is noted. No hydronephrosis or renal obstruction is noted. Atherosclerotic calcifications of abdominal aorta are noted without aneurysm formation. The appendix appears normal. 7.8 x 4.6 cm complex fluid collection is seen inferior to  inflamed portion of sigmoid colon consistent with diverticular abscess. There is no evidence of bowel obstruction. Urinary bladder appears normal. Calcified fibroid is noted in the uterus. Mild fat containing periumbilical hernia is noted with inflammatory stranding.  IMPRESSION: Diverticulitis is sigmoid colon is noted with adjacent 7.8 x 4.6 cm complex fluid collection consistent with diverticular abscess.  Mild fat containing periumbilical hernia is noted which may be inflamed.   Electronically Signed   By: Marijo Conception, M.D.   On: 02/07/2015 20:12   Ct Angio Ao+bifem W/cm &/or Wo/cm  02/06/2015   CLINICAL DATA:  Back pain, nausea and fever. History of peripheral vascular disease with known chronic SFA occlusions and right external iliac artery stenosis.  EXAM: CT ANGIOGRAPHY OF ABDOMINAL AORTA WITH ILIOFEMORAL RUNOFF  TECHNIQUE: Multidetector CT imaging of the abdomen, pelvis and lower extremities was performed using the standard protocol during bolus administration of intravenous contrast. Multiplanar CT image reconstructions and MIPs were obtained to evaluate the vascular anatomy.  CONTRAST:  1100mL OMNIPAQUE IOHEXOL 350 MG/ML SOLN  COMPARISON:  10/03/2008  FINDINGS: Aorta: The abdominal aorta is normally patent and demonstrates mild plaque. No evidence of aortic aneurysm or stenosis. The celiac axis, superior mesenteric artery and inferior mesenteric artery show stable and normal patency. Midportion of the left renal artery demonstrates stable moderate focal stenosis of approximately 50% narrowing. Mild progression of plaque in the proximal right renal artery without significant stenosis identified.  Right Lower Extremity: Calcified plaque present in the common iliac artery without significant stenosis. Previously identified focal stenosis of the mid right external iliac artery has been stented with a widely patent stent present. The common femoral artery shows distal stenosis of approximately 50%. The  profunda femoral artery is normally patent. The SFA is chronically occluded at its origin. Distal SFA is reconstituted by collaterals at the adductor hiatus. The popliteal artery is normally patent. Three-vessel runoff is demonstrated into the foot with dominant anterior tibial runoff.  Left Lower Extremity: Calcified plaque is present in the common and external iliac arteries without significant stenosis identified. Distal common femoral artery becomes of larger caliber, likely related to prior patch angioplasty. The native SFA is chronically occluded. The profunda femoral artery is open. The popliteal artery is reconstituted above the knee. Peroneal posterior tibial arteries are open into the foot. The anterior tibial artery is open but poorly opacified and diseased with calcified plaque present.  Arterial phase evaluation of the solid organs is unremarkable including the liver, gallbladder, pancreas, spleen, adrenal glands and kidneys.  There is thickening and inflammation involving the sigmoid colon. Lumen is difficult to follow in the pelvis and there may be some developing areas of abscess formation. Findings are most likely consistent with diverticulitis. Mass cannot be excluded. Follow-up imaging with CT of the abdomen and pelvis during venous phase contrast opacification and with oral contrast may be helpful. No free  intraperitoneal air is identified. No enlarged lymph nodes are seen.  Review of the MIP images confirms the above findings.  IMPRESSION: 1. Evidence of probable diverticulitis involving the sigmoid colon. There may be developing adjacent diverticular abscess is predominantly posterior and inferior to the sigmoid colon. This is difficult to delineate without oral contrast on poor and during the arterial phase CTA. Mass of the colon cannot be excluded on the current study. Follow-up with venous phase CT of the abdomen and pelvis with oral contrast would be helpful. 2. Prior stenting of right  external iliac artery stenosis. 3. Chronic bilateral SFA occlusions.   Electronically Signed   By: Aletta Edouard M.D.   On: 02/06/2015 17:19   Medications: I have reviewed the patient's current medications. Scheduled Meds: . aspirin EC  81 mg Oral Daily  . atorvastatin  40 mg Oral QHS  . buPROPion  300 mg Oral Daily  . carvedilol  25 mg Oral BID WC  . ciprofloxacin  400 mg Intravenous Q12H  . diazepam  10 mg Oral TID  . DULoxetine  60 mg Oral Daily  . feeding supplement (RESOURCE BREEZE)  1 Container Oral BID BM  . gabapentin  800 mg Oral BID  . hydrochlorothiazide  12.5 mg Oral Daily  . lisinopril  20 mg Oral BID  . metronidazole  500 mg Intravenous Q8H  . potassium chloride  10 mEq Oral Daily  . Warfarin - Pharmacist Dosing Inpatient   Does not apply q1800  . ziprasidone  60 mg Oral BID WC   Continuous Infusions: . heparin 1,650 Units/hr (02/08/15 0616)   PRN Meds:.HYDROmorphone (DILAUDID) injection, hydrOXYzine Assessment/Plan: Principal Problem:   Diverticulitis Active Problems:   Chronic pain syndrome   Long term current use of anticoagulant   HTN (hypertension)   UTI (lower urinary tract infection)   #Diverticulitis with Abscess: Alisha [name] has sigmoid diverticulitis and CT w oral contrast demonstrated adjacent 7.8 x 4.6 cm complex fluid collection consistent with diverticular abscess. While she continues to report symptomatic improvement on cipro/flagyl, her WBC is not back this morning and she had a mild fever of 38.2C overnight with blood cultures ordered. Alisha [name], who planned to leave AMA yesterday to deal with an undisclosed personal matter at home, has agreed to stay for surgical evaluation. I called general surgery this morning who will evaluate the patient and provide recommendations. While she is on anticoagulation, she was subtherapeutic on admission and is on a heparin drip for bridge which will likely be stopped for surgery. -f/u general surgery  recommendations -cpirofloxacin 400 mg iv q12h -metronidazole 500 mg iv q8h -BCx x 2 -dilaudid 1 mg iv q4hprn -zofran 4 mg iv q8hprn -NPO pending surgery recs  #UTI: Alisha [name] complained of dysuria on presentation with many bacteria on UA as well as flank pain but normal kidneys on CT. She is on cipro/flagyl for diverticulitis and the cipro will cover for possible UTI. She remains asymptomatic. 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 She was subtherapeutic with INR 1.21 on presentation. AM INR not loaded yet today. She was started on heparin drip to bridge again to warfarin. We will continue this pending surgery recs which will likely require holding anti-coagulation. -cont warfarin, heparin per pharmacy for now  #CHF: Echo 2007 45% EF per EMR. At home she is on ASA 81 mg daily, carvedilol 25 mg bid, lisinopril 20 mg bid, KDur 10  mEq -cont ASA 81 mg daily, carvedilol 25 mg bid, lisinopril 20 mg bid  #CAD - 02/10/14 stress test low risk but anterior abn, Cath on 02/25/14 nonobstructive CAD, normal LV function 55-65%.  At home she is on ASA 81 mg daily, carvedilol 25 mg bid, lisinopril 20 mg bid, atorvastatin 40 mg daily. -cont ASA 81 mg daily, carvedilol 25 mg bid, lisinopril 20 mg bid, atorvastatin 40 mg daily  #HTN: BP 120s-130s/70s overnight/this morning. At home she is on ASA 81 mg daily, carvedilol 25 mg bid, lisinopril 20 mg bid, HCTZ 12.5 mg daily, atorvastatin 40 mg daily. -ASA 81 mg daily, carvedilol 25 mg bid, lisinopril 20 mg bid, HCTZ 12.5 mg daily, atorvastatin 40 mg daily  #HLD: Last lipid profile 10/01/2011 w cholesterol 162, triglyceride 205, HDL 25, LDL 96. At home she is on atorvastatin 40 mg daily. -cont atorvastatin 40 mg daily  Depression - cont wellbutrin 300 mg daily, ziprasidone 60 mg bid, cymbalta 60 mg daily, valium 10 mg tid.   Dispo: Disposition is deferred at this time, awaiting  improvement of current medical problems.  Anticipated discharge in approximately 2 day(s).   The patient does have a current PCP Julious Oka, MD) and does need an [facility] hospital follow-up appointment after discharge.  The patient does not know have transportation limitations that hinder transportation to clinic appointments.  .Services Needed at time of discharge: Y = Yes, Blank = No PT:   OT:   RN:   Equipment:   Other:     LOS: 2 days   Kelby Aline, MD 02/08/2015, 9:19 AM

## 2015-02-08 NOTE — Progress Notes (Signed)
ANTICOAGULATION CONSULT NOTE - Initial Consult  Pharmacy Consult for coumadin and heparin Indication: hx of bilateral DVT  Allergies  Allergen Reactions  . Propoxyphene N-Acetaminophen Swelling    Patient Measurements: Height: 5' 4.8" (164.6 cm) Weight: 176 lb 5.9 oz (80 kg) IBW/kg (Calculated) : 56.54 Heparin Dosing Weight: 73.5 kg  Vital Signs: Temp: 99 F (37.2 C) (02/10 1410) Temp Source: Oral (02/10 1410) BP: 123/76 mmHg (02/10 1410)  Labs:  Recent Labs  02/06/15 1026 02/07/15 0336  02/07/15 2055 02/08/15 0821 02/08/15 1925  HGB 14.4 13.8  --   --  13.2  --   HCT 41.3 39.9  --   --  39.7  --   PLT 212 213  --   --  206  --   LABPROT 14.9 15.4*  --   --  18.1*  --   INR 1.16 1.21  --   --  1.48  --   HEPARINUNFRC  --  <0.10*  < > 0.20* 0.22* 0.51  CREATININE 0.75 0.80  --   --  0.74  --   < > = values in this interval not displayed.  Estimated Creatinine Clearance: 85.6 mL/min (by C-G formula based on Cr of 0.74).   Medical History: Past Medical History  Diagnosis Date  . DVT (deep venous thrombosis)     bilateral, 2 episodes: Requires lifelong therapy  . Chronic pain syndrome   . Restless leg syndrome   . Depression   . Hyperlipidemia   . Hypertension   . PVD (peripheral vascular disease)     s/p L fem-pop bypass 11/21/08; graft occluded 12/28/08;  aortogram w/ bilat LE runoff: 1.  bilat diffuse SFA occlusive dz, 2.  Mod to severe above-knee popliteal dz,  3.  Bilat 3-vessel runoff w/ mild tibial occlusive dz.  . Tobacco abuse   . Degenerative joint disease of knee   . Breast lump 02/2008    Biopsy 05/2008: showed no evidence of malignancy  . Irregular menses 9/08  . Pulmonary edema   . Anxiety   . Joint pain   . Mixed stress and urge urinary incontinence     Being followed by [facility], underwent cystoscopy & uroflowmetry   . Endometrial mass 10/12/2012    Endometrium, biopsy on 11/04/12 - PROLIFERATIVE ENDOMETRIUM AND ABUNDANT MUCUS. NO  HYPERPLASIA OR CARCINOMA.   . Sigmoid diverticulitis 10/26/2012  . Asthma   . Breast mass in female     bi lat     Medications:  Scheduled:  . aspirin EC  81 mg Oral Daily  . atorvastatin  40 mg Oral QHS  . buPROPion  300 mg Oral Daily  . carvedilol  25 mg Oral BID WC  . ciprofloxacin  400 mg Intravenous Q12H  . diazepam  10 mg Oral TID  . DULoxetine  60 mg Oral Daily  . feeding supplement (RESOURCE BREEZE)  1 Container Oral BID BM  . gabapentin  800 mg Oral BID  . hydrochlorothiazide  12.5 mg Oral Daily  . lisinopril  20 mg Oral BID  . metronidazole  500 mg Intravenous Q8H  . potassium chloride  10 mEq Oral Daily  . warfarin  5 mg Oral ONCE-1800  . Warfarin - Pharmacist Dosing Inpatient   Does not apply q1800  . ziprasidone  60 mg Oral BID WC   Infusions:  . heparin 1,800 Units/hr (02/08/15 1106)    Assessment: 54 yo female with hx of bilateral DVT will be continued on coumadin  but will also be bridged with heparin.  CBC stable, hgb 13.2, plts wnl.  INR 1.4 today, increasing appropriately.  Patient was on coumadin 7.5 mg alternating with 3.75 mg daily.  Last dose was last week.  Now on metronidazole -- expect fluctuations in INR. HL SUBtherapeutic today.  Follow-up HL is therapeutic at 0.51 on heparin 1800 units/hr. No bleeding is noted.  Goal of Therapy:  Heparin level 0.3-0.7 units/ml; INR 2-3 Monitor platelets by anticoagulation protocol: Yes   Plan:  - Continue heparin 1800 units/hr - 6hr confirmatory heparin level  - warfarin 5 mg po x1  - daily heparin level, CBC, INR  Andrey Cota. Diona Foley, PharmD Clinical Pharmacist Pager [phone] 02/08/2015 8:18 PM

## 2015-02-08 NOTE — Progress Notes (Signed)
Pt is on clear liquid diet. Family brought pt solid food and pt informed she can not eat due to diet restrictions. Despite education, pt still eating solid food. After more repeated education, pt stopped eating food and started eating clear liquid tray. Will continue to monitor.

## 2015-02-08 NOTE — Progress Notes (Signed)
ANTICOAGULATION CONSULT NOTE - Initial Consult  Pharmacy Consult for coumadin and heparin Indication: hx of bilateral DVT  Allergies  Allergen Reactions  . Propoxyphene N-Acetaminophen Swelling    Patient Measurements: Height: 5' 4.8" (164.6 cm) Weight: 176 lb 5.9 oz (80 kg) IBW/kg (Calculated) : 56.54 Heparin Dosing Weight: 73.5 kg  Vital Signs: Temp: 99.7 F (37.6 C) (02/10 0558) Temp Source: Oral (02/10 0558) BP: 133/77 mmHg (02/10 0558) Pulse Rate: 92 (02/10 0558)  Labs:  Recent Labs  02/06/15 1026  02/07/15 0336 02/07/15 1250 02/07/15 2055 02/08/15 0821  HGB 14.4  --  13.8  --   --  13.2  HCT 41.3  --  39.9  --   --  39.7  PLT 212  --  213  --   --  206  LABPROT 14.9  --  15.4*  --   --  18.1*  INR 1.16  --  1.21  --   --  1.48  HEPARINUNFRC  --   < > <0.10* 0.13* 0.20* 0.22*  CREATININE 0.75  --  0.80  --   --  0.74  < > = values in this interval not displayed.  Estimated Creatinine Clearance: 85.6 mL/min (by C-G formula based on Cr of 0.74).   Medical History: Past Medical History  Diagnosis Date  . DVT (deep venous thrombosis)     bilateral, 2 episodes: Requires lifelong therapy  . Chronic pain syndrome   . Restless leg syndrome   . Depression   . Hyperlipidemia   . Hypertension   . PVD (peripheral vascular disease)     s/p L fem-pop bypass 11/21/08; graft occluded 12/28/08;  aortogram w/ bilat LE runoff: 1.  bilat diffuse SFA occlusive dz, 2.  Mod to severe above-knee popliteal dz,  3.  Bilat 3-vessel runoff w/ mild tibial occlusive dz.  . Tobacco abuse   . Degenerative joint disease of knee   . Breast lump 02/2008    Biopsy 05/2008: showed no evidence of malignancy  . Irregular menses 9/08  . Pulmonary edema   . Anxiety   . Joint pain   . Mixed stress and urge urinary incontinence     Being followed by [facility], underwent cystoscopy & uroflowmetry   . Endometrial mass 10/12/2012    Endometrium, biopsy on 11/04/12 - PROLIFERATIVE  ENDOMETRIUM AND ABUNDANT MUCUS. NO HYPERPLASIA OR CARCINOMA.   . Sigmoid diverticulitis 10/26/2012  . Asthma   . Breast mass in female     bi lat     Medications:  Scheduled:  . aspirin EC  81 mg Oral Daily  . atorvastatin  40 mg Oral QHS  . buPROPion  300 mg Oral Daily  . carvedilol  25 mg Oral BID WC  . ciprofloxacin  400 mg Intravenous Q12H  . diazepam  10 mg Oral TID  . DULoxetine  60 mg Oral Daily  . feeding supplement (RESOURCE BREEZE)  1 Container Oral BID BM  . gabapentin  800 mg Oral BID  . hydrochlorothiazide  12.5 mg Oral Daily  . lisinopril  20 mg Oral BID  . metronidazole  500 mg Intravenous Q8H  . potassium chloride  10 mEq Oral Daily  . Warfarin - Pharmacist Dosing Inpatient   Does not apply q1800  . ziprasidone  60 mg Oral BID WC   Infusions:  . heparin 1,650 Units/hr (02/08/15 1041)    Assessment: 54 yo female with hx of bilateral DVT will be continued on coumadin but will also  be bridged with heparin.  CBC stable, hgb 13.2, plts wnl.  INR 1.4 today, increasing appropriately.  Patient was on coumadin 7.5 mg alternating with 3.75 mg daily.  Last dose was last week.  Now on metronidazole -- expect fluctuations in INR. HL SUBtherapeutic today.   Goal of Therapy:  Heparin level 0.3-0.7 units/ml; INR 2-3 Monitor platelets by anticoagulation protocol: Yes   Plan:  - warfarin 5 mg po x1  - Increase heparin gtt to 1800 units/hr - 6hr heparin level  - daily heparin level, CBC, INR    Hughes Better, PharmD, BCPS Clinical Pharmacist Pager: [phone] 02/08/2015 10:58 AM

## 2015-02-08 NOTE — Progress Notes (Signed)
Pt requesting to leave AMA. States she has a personal problem at home that only she can fix. RN informed pt of the risks of leaving but pt still wanting to go. Wilson MD paged and spoke with pt over the phone. Pt seemed hesitant to stay but after much convincing from from MD, pt agreeable with staying. Family just arrived at bedside and is also persuading pt to stay. Will continue to monitor and will page MD if pt changes her mind.

## 2015-02-08 NOTE — Consult Note (Signed)
Chief Complaint: Chief Complaint  Patient presents with  . Urinary Tract Infection  . Joint Pain  diverticular abscess  Referring Physician(s): [facility]  History of Present Illness: Alisha [name] is a 54 y.o. 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 DVT (deep venous thrombosis)     bilateral, 2 episodes: Requires lifelong therapy  . Chronic pain syndrome   . Restless leg syndrome   . Depression   . Hyperlipidemia   . Hypertension   . PVD (peripheral vascular disease)     s/p L fem-pop bypass 11/21/08; graft occluded 12/28/08;  aortogram w/ bilat LE runoff: 1.  bilat diffuse SFA occlusive dz, 2.  Mod to severe above-knee popliteal dz,  3.  Bilat 3-vessel runoff w/ mild tibial occlusive dz.  . Tobacco abuse   . Degenerative joint disease of knee   . Breast lump 02/2008    Biopsy 05/2008: showed no evidence of malignancy  . Irregular menses 9/08  . Pulmonary edema   . Anxiety   . Joint pain   . Mixed stress and urge urinary incontinence     [facility], underwent cystoscopy & uroflowmetry   . Endometrial mass 10/12/2012    Endometrium, biopsy on 11/04/12 - PROLIFERATIVE ENDOMETRIUM AND ABUNDANT MUCUS. NO HYPERPLASIA OR CARCINOMA.   . Sigmoid diverticulitis 10/26/2012  . Asthma   . Breast mass in female     bi lat     Past Surgical History  Procedure Laterality Date  . Femoral bypass  11/09  .  right external iliac artery stent    . Right breast  needle-localized lumpectomy    . Pr vein bypass graft,aorto-fem-pop    . Carotid endarterectomy    . Tubal ligation    . Tonsillectomy    . Cardiac catheterization  2/15  . Breast lumpectomy with radioactive seed localization Bilateral 05/03/2014    Procedure: BREAST LUMPECTOMY WITH RADIOACTIVE SEED LOCALIZATION;  Surgeon: Joyice Faster. Cornett, MD;  Location: [facility];  Service: General;  Laterality: Bilateral;  . Left heart catheterization with coronary angiogram N/A 02/25/2014    Procedure: LEFT HEART CATHETERIZATION WITH CORONARY ANGIOGRAM;  Surgeon: Peter M Martinique, MD;  Location: [facility] CATH LAB;  Service: Cardiovascular;  Laterality: N/A;    Allergies: Propoxyphene n-acetaminophen  Medications: Prior to Admission medications   Medication Sig Start Date End Date Taking? Authorizing Provider  aspirin EC 81 MG tablet Take 81 mg by mouth daily.   Yes Historical Provider, MD  atorvastatin (LIPITOR) 40 MG tablet Take 1 tablet (40 mg total) by mouth at bedtime. 11/05/13  Yes Ejiroghene E Emokpae, MD  buPROPion (WELLBUTRIN XL) 300 MG 24 hr tablet Take 300 mg by mouth daily. 12/11/14  Yes Historical Provider, MD  carvedilol (COREG) 25 MG tablet Take 1 tablet (25 mg total) by mouth 2 (two) times daily with a meal. 02/28/14  Yes Karren Cobble, MD  diazepam (VALIUM) 5 MG tablet Take 10 mg by mouth 3 (three) times  daily.    Yes Historical Provider, MD  DULoxetine (CYMBALTA) 60 MG capsule Take 60 mg by mouth daily.    Yes Historical Provider, MD  gabapentin (NEURONTIN) 800 MG tablet Take 1 tablet in the morning, 1 tablet midday and 2 tablets at bedtime 01/02/15  Yes Julious Oka, MD  hydrochlorothiazide (HYDRODIURIL) 12.5 MG tablet Take 1 tablet (12.5 mg total) by mouth daily. 08/11/14  Yes Julious Oka, MD  hydrOXYzine (ATARAX/VISTARIL) 25 MG tablet Take 25 mg by mouth 3 (three) times daily as needed for anxiety.   Yes Historical Provider, MD  lisinopril (PRINIVIL,ZESTRIL) 20 MG tablet Take 1  tablet (20 mg total) by mouth 2 (two) times daily. 08/11/14 08/11/15 Yes Julious Oka, MD  Multiple Vitamins-Minerals (ONE-A-DAY 50 PLUS PO) Take 1 tablet by mouth daily.   Yes Historical Provider, MD  potassium chloride (K-DUR) 10 MEQ tablet TAKE 1 TABLET BY MOUTH EVERY DAY 08/29/14  Yes Thayer Headings, MD  warfarin (COUMADIN) 7.5 MG tablet Take 3.75-7.5 mg by mouth daily. Take 3.75mg  alternating with 7.5mg  daily   Yes Historical Provider, MD  ziprasidone (GEODON) 60 MG capsule Take 60 mg by mouth 2 (two) times daily with a meal.    Yes Historical Provider, MD  albuterol (PROVENTIL HFA;VENTOLIN HFA) 108 (90 BASE) MCG/ACT inhaler Inhale 1-2 puffs into the lungs every 4 (four) hours as needed for wheezing or shortness of breath. 08/29/14   Charlott Rakes, MD  ciprofloxacin (CIPRO) 500 MG tablet Take 1 tablet (500 mg total) by mouth 2 (two) times daily. 02/07/15   Kelby Aline, MD  metroNIDAZOLE (FLAGYL) 500 MG tablet Take 1 tablet (500 mg total) by mouth 3 (three) times daily. 02/07/15   Kelby Aline, MD    Family History  Problem Relation Age of Onset  . Breast cancer Mother   . Colon cancer Neg Hx   . Rectal cancer Neg Hx   . Stomach cancer Neg Hx   . Breast cancer Other     GREAT AUNT    History   Social History  . Marital Status: Divorced    Spouse Name: N/A  . Number of Children: 4  . Years of Education: N/A   Occupational History  . disable    Social History Main Topics  . Smoking status: Current Every Day Smoker -- 0.50 packs/day for 18 years    Types: Cigarettes  . Smokeless tobacco: Never Used     Comment: 3-6 per day  . Alcohol Use: Yes     Comment: rare  . Drug Use: No     Comment: hx of marijuana and cocaine use  . Sexual Activity: No   Other Topics Concern  . None   Social History Narrative   Patent examiner initiated. Patient needs to submit further paperwork to complete   [facility]  September 11, 2010 2:04 PM   Financial assistance approved  for 100% discount at [facility] and has [facility] card   Alisha [name]  October 04, 2010 5:29 PM     Review of Systems: A 12 point ROS discussed and pertinent positives are indicated in the HPI above.  All other systems are negative.  Review of Systems  Constitutional: Positive for fever, activity change, appetite change and fatigue.  Respiratory: Negative for shortness of breath.   Cardiovascular: Negative for chest pain.  Gastrointestinal: Positive for nausea, abdominal pain and abdominal distention.  Genitourinary: Negative for difficulty urinating.  Musculoskeletal: Negative for back pain.  Neurological: Positive  for weakness.  Psychiatric/Behavioral: Negative for behavioral problems and confusion.     Vital Signs: BP 133/77 mmHg  Pulse 92  Temp(Src) 99.7 F (37.6 C) (Oral)  Resp 20  Ht 5' 4.8" (1.646 m)  Wt 80 kg (176 lb 5.9 oz)  BMI 29.53 kg/m2  SpO2 93%  LMP  (LMP Unknown)  Physical Exam  Constitutional: She is oriented to person, place, and time. She appears well-nourished.  Cardiovascular: Normal rate, regular rhythm and normal heart sounds.   Pulmonary/Chest: Effort normal and breath sounds normal. She has no wheezes.  Abdominal: Soft. Bowel sounds are normal. She exhibits distension. There is tenderness.  Musculoskeletal: Normal range of motion.  Neurological: She is alert and oriented to person, place, and time.  Skin: Skin is warm and dry.  Psychiatric: She has a normal mood and affect. Her behavior is normal. Judgment and thought content normal.  Nursing note and vitals reviewed.   Imaging: Ct Abdomen Pelvis W Contrast  02/07/2015   CLINICAL DATA:  Lower abdominal pain.  EXAM: CT ABDOMEN AND PELVIS WITH CONTRAST  TECHNIQUE: Multidetector CT imaging of the abdomen and pelvis was performed using the standard protocol following bolus administration of intravenous contrast.  CONTRAST:  138mL OMNIPAQUE IOHEXOL 300 MG/ML  SOLN  COMPARISON:  CT scan of February 06, 2015.   FINDINGS: Visualized lung bases appear normal. No significant osseous abnormality is noted.  No gallstones are noted. The liver, spleen and pancreas appear normal. Adrenal glands appear normal. Small right renal cyst is noted. No hydronephrosis or renal obstruction is noted. Atherosclerotic calcifications of abdominal aorta are noted without aneurysm formation. The appendix appears normal. 7.8 x 4.6 cm complex fluid collection is seen inferior to inflamed portion of sigmoid colon consistent with diverticular abscess. There is no evidence of bowel obstruction. Urinary bladder appears normal. Calcified fibroid is noted in the uterus. Mild fat containing periumbilical hernia is noted with inflammatory stranding.  IMPRESSION: Diverticulitis is sigmoid colon is noted with adjacent 7.8 x 4.6 cm complex fluid collection consistent with diverticular abscess.  Mild fat containing periumbilical hernia is noted which may be inflamed.   Electronically Signed   By: Marijo Conception, M.D.   On: 02/07/2015 20:12   Ct Angio Ao+bifem W/cm &/or Wo/cm  02/06/2015   CLINICAL DATA:  Back pain, nausea and fever. History of peripheral vascular disease with known chronic SFA occlusions and right external iliac artery stenosis.  EXAM: CT ANGIOGRAPHY OF ABDOMINAL AORTA WITH ILIOFEMORAL RUNOFF  TECHNIQUE: Multidetector CT imaging of the abdomen, pelvis and lower extremities was performed using the standard protocol during bolus administration of intravenous contrast. Multiplanar CT image reconstructions and MIPs were obtained to evaluate the vascular anatomy.  CONTRAST:  150mL OMNIPAQUE IOHEXOL 350 MG/ML SOLN  COMPARISON:  10/03/2008  FINDINGS: Aorta: The abdominal aorta is normally patent and demonstrates mild plaque. No evidence of aortic aneurysm or stenosis. The celiac axis, superior mesenteric artery and inferior mesenteric artery show stable and normal patency. Midportion of the left renal artery demonstrates stable moderate focal  stenosis of approximately 50% narrowing. Mild progression of plaque in the proximal right renal artery without significant stenosis identified.  Right Lower Extremity: Calcified plaque present in the common iliac artery without significant stenosis. Previously identified focal stenosis of the mid right external iliac artery has been stented with a widely patent stent present. The common femoral artery shows distal stenosis of approximately 50%. The profunda femoral artery is normally patent. The SFA is chronically occluded at  its origin. Distal SFA is reconstituted by collaterals at the adductor hiatus. The popliteal artery is normally patent. Three-vessel runoff is demonstrated into the foot with dominant anterior tibial runoff.  Left Lower Extremity: Calcified plaque is present in the common and external iliac arteries without significant stenosis identified. Distal common femoral artery becomes of larger caliber, likely related to prior patch angioplasty. The native SFA is chronically occluded. The profunda femoral artery is open. The popliteal artery is reconstituted above the knee. Peroneal posterior tibial arteries are open into the foot. The anterior tibial artery is open but poorly opacified and diseased with calcified plaque present.  Arterial phase evaluation of the solid organs is unremarkable including the liver, gallbladder, pancreas, spleen, adrenal glands and kidneys.  There is thickening and inflammation involving the sigmoid colon. Lumen is difficult to follow in the pelvis and there may be some developing areas of abscess formation. Findings are most likely consistent with diverticulitis. Mass cannot be excluded. Follow-up imaging with CT of the abdomen and pelvis during venous phase contrast opacification and with oral contrast may be helpful. No free intraperitoneal air is identified. No enlarged lymph nodes are seen.  Review of the MIP images confirms the above findings.  IMPRESSION: 1. Evidence  of probable diverticulitis involving the sigmoid colon. There may be developing adjacent diverticular abscess is predominantly posterior and inferior to the sigmoid colon. This is difficult to delineate without oral contrast on poor and during the arterial phase CTA. Mass of the colon cannot be excluded on the current study. Follow-up with venous phase CT of the abdomen and pelvis with oral contrast would be helpful. 2. 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 3. Chronic bilateral SFA occlusions.   Electronically Signed   By: Aletta Edouard M.D.   On: 02/06/2015 17:19    Labs:  CBC:  Recent Labs  06/06/14 0105 08/28/14 2106 02/06/15 1026 02/07/15 0336 02/08/15 0821  WBC 6.7  --  16.6* 17.6* 15.2*  HGB 13.6 16.3* 14.4 13.8 13.2  HCT 40.6 48.0* 41.3 39.9 39.7  PLT 168  --  212 213 206    COAGS:  Recent Labs  01/30/15 2300 02/06/15 1026 02/07/15 0336 02/08/15 0821  INR 3.10 1.16 1.21 1.48    BMP:  Recent Labs  06/06/14 0105 08/28/14 2106 02/06/15 1026 02/07/15 0336 02/08/15 0821  NA 145 143 138 137 138  K 3.5* 3.4* 3.9 3.2* 3.6  CL 104 109 104 101 103  CO2 30  --  21 24 23   GLUCOSE 87 89 109* 102* 86  BUN 14 9 9 6 6   CALCIUM 9.3  --  9.0 9.1 8.8  CREATININE 0.95 0.90 0.75 0.80 0.74  GFRNONAA 68*  --  >90 83* >90  GFRAA 79*  --  >90 >90 >90    LIVER FUNCTION TESTS:  Recent Labs  05/03/14 1015 02/06/15 1026  BILITOT 0.3 1.5*  AST 14 20  ALT 12 16  ALKPHOS 62 72  PROT 7.4 7.2  ALBUMIN 4.0 3.1*    TUMOR MARKERS: No results for input(s): AFPTM, CEA, CA199, CHROMGRNA in the last 8760 hours.  Assessment and Plan:  Diverticular abscess Scheduled for abscess drain placement 2/11 Will hold coumadin and hep infusion in am Pt aware of procedure benefits and risks including but not limited to: Infection; bleeding; surrounding structure damage Agreeable to proceed Consent signed andin chart   Thank you for this interesting consult.  I  greatly enjoyed meeting Alila S Bayless and look  forward to participating in their care.  Signed: Eyad Rochford A 02/08/2015, 11:04 AM   I spent a total of 40 minutes face to face in clinical consultation, greater than 50% of which was counseling/coordinating care for diverticular abscess drain

## 2015-02-09 ENCOUNTER — Encounter (HOSPITAL_COMMUNITY): Payer: Self-pay

## 2015-02-09 ENCOUNTER — Telehealth: Payer: Self-pay | Admitting: Internal Medicine

## 2015-02-09 ENCOUNTER — Inpatient Hospital Stay (HOSPITAL_COMMUNITY): Payer: Medicare Other

## 2015-02-09 LAB — CBC
HEMATOCRIT: 37.5 % (ref 36.0–46.0)
Hemoglobin: 12.2 g/dL (ref 12.0–15.0)
MCH: 32 pg (ref 26.0–34.0)
MCHC: 32.5 g/dL (ref 30.0–36.0)
MCV: 98.4 fL (ref 78.0–100.0)
Platelets: 241 10*3/uL (ref 150–400)
RBC: 3.81 MIL/uL — AB (ref 3.87–5.11)
RDW: 13.2 % (ref 11.5–15.5)
WBC: 12.1 10*3/uL — AB (ref 4.0–10.5)

## 2015-02-09 LAB — BASIC METABOLIC PANEL
Anion gap: 9 (ref 5–15)
CHLORIDE: 104 mmol/L (ref 96–112)
CO2: 25 mmol/L (ref 19–32)
Calcium: 8.8 mg/dL (ref 8.4–10.5)
Creatinine, Ser: 0.73 mg/dL (ref 0.50–1.10)
GFR calc non Af Amer: >90 mL/min (ref >90)
Glucose, Bld: 117 mg/dL — ABNORMAL HIGH (ref 70–99)
Potassium: 3.6 mmol/L (ref 3.5–5.1)
Sodium: 138 mmol/L (ref 135–145)

## 2015-02-09 LAB — PROTIME-INR
INR: 1.38 (ref 0.00–1.49)
PROTHROMBIN TIME: 17.1 s — AB (ref 11.6–15.2)

## 2015-02-09 LAB — HEPARIN LEVEL (UNFRACTIONATED): Heparin Unfractionated: 0.23 IU/mL — ABNORMAL LOW (ref 0.30–0.70)

## 2015-02-09 MED ORDER — HEPARIN (PORCINE) IN NACL 100-0.45 UNIT/ML-% IJ SOLN
1800.0000 [IU]/h | INTRAMUSCULAR | Status: DC
  Filled 2015-02-09 (×2): qty 250

## 2015-02-09 MED ORDER — CIPROFLOXACIN HCL 500 MG PO TABS: 500.0000 mg | ORAL_TABLET | Freq: Two times a day (BID) | ORAL | Status: DC

## 2015-02-09 MED ORDER — FENTANYL CITRATE 0.05 MG/ML IJ SOLN
INTRAMUSCULAR | Status: AC | PRN
Start: 2015-02-09 — End: 2015-02-09
  Administered 2015-02-09: 25 ug via INTRAVENOUS
  Administered 2015-02-09: 50 ug via INTRAVENOUS

## 2015-02-09 MED ORDER — METRONIDAZOLE 500 MG PO TABS: 500.0000 mg | ORAL_TABLET | Freq: Three times a day (TID) | ORAL | Status: DC

## 2015-02-09 MED ORDER — MIDAZOLAM HCL 2 MG/2ML IJ SOLN
INTRAMUSCULAR | Status: AC
  Filled 2015-02-09: qty 4

## 2015-02-09 MED ORDER — MIDAZOLAM HCL 2 MG/2ML IJ SOLN
INTRAMUSCULAR | Status: AC | PRN
  Administered 2015-02-09: 0.5 mg via INTRAVENOUS
  Administered 2015-02-09: 1 mg via INTRAVENOUS

## 2015-02-09 MED ORDER — FENTANYL CITRATE 0.05 MG/ML IJ SOLN
INTRAMUSCULAR | Status: AC
  Filled 2015-02-09: qty 4

## 2015-02-09 MED ORDER — SODIUM CHLORIDE 0.9 % IV SOLN
INTRAVENOUS | Status: AC | PRN
  Administered 2015-02-09: 10 mL/h via INTRAVENOUS

## 2015-02-09 MED ORDER — LIDOCAINE HCL 1 % IJ SOLN
INTRAMUSCULAR | Status: AC
  Filled 2015-02-09: qty 20

## 2015-02-09 NOTE — Discharge Instructions (Signed)
You are leaving [place]. We have counseled you on the dangers of leaving which could include further infection, blood clots, or even death. We are giving you a prescription for two antibiotics as a safety net since you will not stay. You are to take ciprofloxacin twice a day and flagyl 3 times a day for 8 more days. Please start this tomorrow 2/10 as you received it today in the hospital. Please return to the ED if you have any new or worsening abdominal pain, fever, nausea, or other worrisome medical condition. We made you a follow-up appointment for Friday 2/12 in [facility] clinic.  [name], MD

## 2015-02-09 NOTE — Procedures (Signed)
Successful CT pelvic diverticular abscess drain insertion No comp Stable Gs/cx sent  Full report in pacs

## 2015-02-09 NOTE — Progress Notes (Addendum)
ANTICOAGULATION CONSULT NOTE - Initial Consult  Pharmacy Consult for coumadin and heparin Indication: hx of bilateral DVT  Allergies  Allergen Reactions  . Propoxyphene N-Acetaminophen Swelling    Patient Measurements: Height: 5' 4.8" (164.6 cm) Weight: 176 lb 5.9 oz (80 kg) IBW/kg (Calculated) : 56.54 Heparin Dosing Weight: 73.5 kg  Vital Signs: Temp: 98.4 F (36.9 C) (02/11 1136) Temp Source: Oral (02/11 1136) BP: 131/76 mmHg (02/11 1136) Pulse Rate: 70 (02/11 1136)  Labs:  Recent Labs  02/07/15 0336  02/08/15 0821 02/08/15 1925 02/09/15 0649  HGB 13.8  --  13.2  --  12.2  HCT 39.9  --  39.7  --  37.5  PLT 213  --  206  --  241  LABPROT 15.4*  --  18.1*  --  17.1*  INR 1.21  --  1.48  --  1.38  HEPARINUNFRC <0.10*  < > 0.22* 0.51 0.23*  CREATININE 0.80  --  0.74  --  0.73  < > = values in this interval not displayed.  Estimated Creatinine Clearance: 85.6 mL/min (by C-G formula based on Cr of 0.73).   Medical History: Past Medical History  Diagnosis Date  . DVT (deep venous thrombosis)     bilateral, 2 episodes: Requires lifelong therapy  . Chronic pain syndrome   . Restless leg syndrome   . Depression   . Hyperlipidemia   . Hypertension   . PVD (peripheral vascular disease)     s/p L fem-pop bypass 11/21/08; graft occluded 12/28/08;  aortogram w/ bilat LE runoff: 1.  bilat diffuse SFA occlusive dz, 2.  Mod to severe above-knee popliteal dz,  3.  Bilat 3-vessel runoff w/ mild tibial occlusive dz.  . Tobacco abuse   . Degenerative joint disease of knee   . Breast lump 02/2008    Biopsy 05/2008: showed no evidence of malignancy  . Irregular menses 9/08  . Pulmonary edema   . Anxiety   . Joint pain   . Mixed stress and urge urinary incontinence     [facility], underwent cystoscopy & uroflowmetry   . Endometrial mass 10/12/2012    Endometrium, biopsy on 11/04/12 - PROLIFERATIVE ENDOMETRIUM AND ABUNDANT MUCUS. NO HYPERPLASIA OR  CARCINOMA.   . Sigmoid diverticulitis 10/26/2012  . Asthma   . Breast mass in female     bi lat     Medications:  Scheduled:  . aspirin EC  81 mg Oral Daily  . atorvastatin  40 mg Oral QHS  . buPROPion  300 mg Oral Daily  . carvedilol  25 mg Oral BID WC  . ciprofloxacin  400 mg Intravenous Q12H  . diazepam  10 mg Oral TID  . DULoxetine  60 mg Oral Daily  . feeding supplement (RESOURCE BREEZE)  1 Container Oral BID BM  . gabapentin  800 mg Oral BID  . hydrochlorothiazide  12.5 mg Oral Daily  . lisinopril  20 mg Oral BID  . metronidazole  500 mg Intravenous Q8H  . potassium chloride  10 mEq Oral Daily  . warfarin  5 mg Oral ONCE-1800  . Warfarin - Pharmacist Dosing Inpatient   Does not apply q1800  . ziprasidone  60 mg Oral BID WC   Infusions:  . heparin      Assessment: 54 yo female with hx of bilateral DVT will be continued on coumadin but will also be bridged with heparin.  CBC stable, hgb 12.2, plts wnl.  INR 1.3 today.  Patient  was on coumadin 7.5 mg alternating with 3.75 mg daily. Now on metronidazole -- expect fluctuations in INR. Warfarin was held on 2/10 and 2/11 for IR procedure. Pt now s/p pelvic diverticular abscess drain insertion. Per discussion with IR PA, will resume heparin this afternoon and resume warfarin tomorrow (2/12).   Goal of Therapy:  Heparin level 0.3-0.7 units/ml; INR 2-3 Monitor platelets by anticoagulation protocol: Yes   Plan:  - heparin gtt to 1800 units/hr - 6hr heparin level  - daily heparin level, CBC, INR - hold warfarin tonight    Hughes Better, PharmD, BCPS Clinical Pharmacist Pager: [phone] 02/09/2015 1:52 PM

## 2015-02-09 NOTE — Telephone Encounter (Signed)
Call to patient to confirm appointment for 2/12 at 2:15 unable to leave message mail box full

## 2015-02-09 NOTE — Progress Notes (Signed)
Patient seen and examined.  Now s/p aspiration with drain in place.  Have discussed at length the need to stay in the hospital with risk of death, infection, bleeding, perforation.   Exam: awake, alert, nad CV: RRR Abd: drain left side, serosanguionous drainage, soft Ext: no edema A/P - diverticular abscess - continue cipro/flagyl, await cultures.  Otherwise see Dr. Bebe Shaggy note for details.

## 2015-02-09 NOTE — Progress Notes (Signed)
Subjective: [name] tolerated her IR diverticular abscess drain placement procedure well this morning with no noted complications. She was very anxious on rounds this morning and continues to insist that she needs to leave to take care of a personal matter at home. See prior notes from 2/10 and 2/9 regarding similar episodes of attempted leaving AMA. We again explained to her that it is unsafe for her to leave especially now that she just had this procedure and has a drain in place and we cannot discharge her now. She agreed to see a Education officer, museum but reportedly would not tell them her concern either. She says her abdominal pain is largely resolved but unclear if this is true as she was very anxious about leaving AMA when asked.   Objective: Vital signs in last 24 hours: Filed Vitals:   02/09/15 0850 02/09/15 0855 02/09/15 0956 02/09/15 1036  BP:   141/81 124/81  Pulse: 74 76 75 78  Temp:   97.8 F (36.6 C)   TempSrc:   Oral   Resp: 14 14 20 20   Height:      Weight:      SpO2: 97% 97% 98% 96%   Weight change:   Intake/Output Summary (Last 24 hours) at 02/09/15 1122 Last data filed at 02/09/15 1041  Gross per 24 hour  Intake   1620 ml  Output   1112 ml  Net    508 ml   Gen: A&O x 4, very anxious lying in bed, well developed, well nourished HEENT: Atraumatic, PERRL, EOMI, sclerae anicteric, moist mucous membranes Heart: Regular rate and rhythm, normal S1 S2, no murmurs, rubs, or gallops Lungs: Clear to auscultation bilaterally, respirations unlabored Abd: Soft, diffuse tenderness on palpation, IR drain with small amount of serosanguinous output, non-distended, + bowel sounds, no hepatosplenomegaly Ext: No edema or cyanosis  Lab Results: Basic Metabolic Panel:  Recent Labs Lab 02/08/15 0821 02/09/15 0649  NA 138 138  K 3.6 3.6  CL 103 104  CO2 23 25  GLUCOSE 86 117*  BUN 6 <5*  CREATININE 0.74 0.73  CALCIUM 8.8 8.8   Liver Function Tests:  Recent Labs Lab  02/06/15 1026  AST 20  ALT 16  ALKPHOS 72  BILITOT 1.5*  PROT 7.2  ALBUMIN 3.1*    Recent Labs Lab 02/06/15 1026  LIPASE 21   CBC:  Recent Labs Lab 02/06/15 1026  02/08/15 0821 02/09/15 0649  WBC 16.6*  < > 15.2* 12.1*  NEUTROABS 13.1*  --   --   --   HGB 14.4  < > 13.2 12.2  HCT 41.3  < > 39.7 37.5  MCV 94.5  < > 97.5 98.4  PLT 212  < > 206 241  < > = values in this interval not displayed. Coagulation:  Recent Labs Lab 02/06/15 1026 02/07/15 0336 02/08/15 0821 02/09/15 0649  LABPROT 14.9 15.4* 18.1* 17.1*  INR 1.16 1.21 1.48 1.38  Urinalysis:  Recent Labs Lab 02/06/15 1159  COLORURINE AMBER*  LABSPEC 1.040*  PHURINE 6.0  GLUCOSEU NEGATIVE  HGBUR NEGATIVE  BILIRUBINUR LARGE*  KETONESUR 40*  PROTEINUR >300*  UROBILINOGEN 1.0  NITRITE NEGATIVE  LEUKOCYTESUR SMALL*  many bacteria, many squamous  Pregnancy test negative  Micro Results: Recent Results (from the past 240 hour(s))  Culture, Urine     Status: None   Collection Time: 02/06/15 11:57 AM  Result Value Ref Range Status   Specimen Description URINE, CLEAN CATCH  Final   Special Requests  ADDED 2243  Final   Colony Count   Final    >=100,000 COLONIES/ML Performed at [facility]    Culture   Final    Multiple bacterial morphotypes present, none predominant. Suggest appropriate recollection if clinically indicated. Performed at [facility]    Report Status 02/08/2015 FINAL  Final  Culture, blood (routine x 2)     Status: None (Preliminary result)   Collection Time: 02/08/15  8:36 AM  Result Value Ref Range Status   Specimen Description BLOOD RIGHT HAND  Final   Special Requests BOTTLES DRAWN AEROBIC ONLY 5CC  Final   Culture   Final           BLOOD CULTURE RECEIVED NO GROWTH TO DATE CULTURE WILL BE HELD FOR 5 DAYS BEFORE ISSUING A FINAL NEGATIVE REPORT Performed at [facility]    Report Status PENDING  Incomplete  Culture, blood (routine x 2)     Status:  None (Preliminary result)   Collection Time: 02/08/15  8:48 AM  Result Value Ref Range Status   Specimen Description BLOOD LEFT HAND  Final   Special Requests BOTTLES DRAWN AEROBIC ONLY 5CC  Final   Culture   Final           BLOOD CULTURE RECEIVED NO GROWTH TO DATE CULTURE WILL BE HELD FOR 5 DAYS BEFORE ISSUING A FINAL NEGATIVE REPORT Performed at [facility]    Report Status PENDING  Incomplete   Studies/Results: Ct Abdomen Pelvis W Contrast  02/07/2015   CLINICAL DATA:  Lower abdominal pain.  EXAM: CT ABDOMEN AND PELVIS WITH CONTRAST  TECHNIQUE: Multidetector CT imaging of the abdomen and pelvis was performed using the standard protocol following bolus administration of intravenous contrast.  CONTRAST:  121mL OMNIPAQUE IOHEXOL 300 MG/ML  SOLN  COMPARISON:  CT scan of February 06, 2015.  FINDINGS: Visualized lung bases appear normal. No significant osseous abnormality is noted.  No gallstones are noted. The liver, spleen and pancreas appear normal. Adrenal glands appear normal. Small right renal cyst is noted. No hydronephrosis or renal obstruction is noted. Atherosclerotic calcifications of abdominal aorta are noted without aneurysm formation. The appendix appears normal. 7.8 x 4.6 cm complex fluid collection is seen inferior to inflamed portion of sigmoid colon consistent with diverticular abscess. There is no evidence of bowel obstruction. Urinary bladder appears normal. Calcified fibroid is noted in the uterus. Mild fat containing periumbilical hernia is noted with inflammatory stranding.  IMPRESSION: Diverticulitis is sigmoid colon is noted with adjacent 7.8 x 4.6 cm complex fluid collection consistent with diverticular abscess.  Mild fat containing periumbilical hernia is noted which may be inflamed.   Electronically Signed   By: Marijo Conception, M.D.   On: 02/07/2015 20:12   Medications: I have reviewed the patient's current medications. Scheduled Meds: . aspirin EC  81 mg Oral Daily   . atorvastatin  40 mg Oral QHS  . buPROPion  300 mg Oral Daily  . carvedilol  25 mg Oral BID WC  . ciprofloxacin  400 mg Intravenous Q12H  . diazepam  10 mg Oral TID  . DULoxetine  60 mg Oral Daily  . feeding supplement (RESOURCE BREEZE)  1 Container Oral BID BM  . gabapentin  800 mg Oral BID  . hydrochlorothiazide  12.5 mg Oral Daily  . lisinopril  20 mg Oral BID  . metronidazole  500 mg Intravenous Q8H  . potassium chloride  10 mEq Oral Daily  . warfarin  5 mg Oral ONCE-1800  . Warfarin - Pharmacist Dosing Inpatient   Does not apply q1800  . ziprasidone  60 mg Oral BID WC   Continuous Infusions:   PRN Meds:.HYDROmorphone (DILAUDID) injection, hydrOXYzine Assessment/Plan: Principal Problem:   Diverticulitis Active Problems:   Chronic pain syndrome   Long term current use of anticoagulant   HTN (hypertension)   UTI (lower urinary tract infection)   Colonic diverticular abscess   #Diverticulitis with Abscess: [name] had successful placement of her IR drain with no complications this morning. She notes symptomatic improvement but unclear if this is true as she was very anxious about waiting to leave as detailed above in HPI. She remains afebrile overnight and WBC continues to trend down to 12.1 this morning from peak of 17.6 while on iv cipro/flagyl. Blood cultures with NGTD. Abscess cultures pending. If she leaves AMA as previously documented, she should get home health RN and RN should educate on flushing her drain before leaving the floor. [name] has been educated several times on the danger of leaving AMA which could include worsening infection or even death. -appreciate general surgery, IR -cpirofloxacin 400 mg iv q12h -metronidazole 500 mg iv q8h -f/u final BCx x 2 -f/u abscess culture and gram stain -dilaudid 1 mg iv q4hprn -zofran 4 mg iv q8hprn -advance diet as tolerated  #UTI: [name] complained of dysuria on presentation with many bacteria on UA as well as flank  pain but normal kidneys on CT. She is on cipro/flagyl for diverticulitis and the cipro will cover for possible UTI. She remains asymptomatic. Urine culture grew >100,000 colonies but no predominant bacterial morphotypes. 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 She was subtherapeutic with INR 1.21 on presentation, this morning 1.38. Last night's warfarin and this morning heparin held for procedure. Will restart today. -cont warfarin, heparin per pharmacy for now  #CHF: Echo 2007 45% EF per EMR. At home she is on ASA 81 mg daily, carvedilol 25 mg bid, lisinopril 20 mg bid, KDur 10 mEq -cont ASA 81 mg daily, carvedilol 25 mg bid, lisinopril 20 mg bid  #CAD - 02/10/14 stress test low risk but anterior abn, Cath on 02/25/14 nonobstructive CAD, normal LV function 55-65%.  At home she is on ASA 81 mg daily, carvedilol 25 mg bid, lisinopril 20 mg bid, atorvastatin 40 mg daily. -cont ASA 81 mg daily, carvedilol 25 mg bid, lisinopril 20 mg bid, atorvastatin 40 mg daily  #HTN: BP 120s-150s/80s-100 overnight/this morning. At home she is on ASA 81 mg daily, carvedilol 25 mg bid, lisinopril 20 mg bid, HCTZ 12.5 mg daily, atorvastatin 40 mg daily. -ASA 81 mg daily, carvedilol 25 mg bid, lisinopril 20 mg bid, HCTZ 12.5 mg daily, atorvastatin 40 mg daily  #HLD: Last lipid profile 10/01/2011 w cholesterol 162, triglyceride 205, HDL 25, LDL 96. At home she is on atorvastatin 40 mg daily. -cont atorvastatin 40 mg daily  Depression - cont wellbutrin 300 mg daily, ziprasidone 60 mg bid, cymbalta 60 mg daily, valium 10 mg tid.   Dispo: Disposition is deferred at this time, awaiting improvement of current medical problems.  Anticipated discharge in approximately 2 day(s).   The patient does have a current PCP Julious Oka, MD) and does need an [facility] hospital follow-up appointment after discharge.  The patient does not know have  transportation limitations that hinder transportation to clinic appointments.  .Services Needed at time  of discharge: Y = Yes, Blank = No PT:   OT:   RN:   Equipment:   Other:     LOS: 3 days   Kelby Aline, MD 02/09/2015, 11:22 AM

## 2015-02-09 NOTE — Progress Notes (Signed)
Pt is still anxious and wanting to go ama, pt states that they have a pressing issue at home but doesn't state what it is, case worker and dr, and nurse have encoureaged her not to go...Marland KitchenMarland Kitchen Pt teaching given on how to take care of jp, dr wrote orders for po antibiotics.Marland KitchenMarland Kitchen

## 2015-02-09 NOTE — Discharge Summary (Signed)
PATIENT LEFT AMA  Name: Alisha [name] MRN: [id number] DOB: 1961/08/12 54 y.o. PCP: Julious Oka, MD  Date of Admission: 02/06/2015  9:45 AM Date of AMA Leave: 02/09/2015 Attending Physician: No att. providers found  Diagnoses:  Principal Problem:   Diverticulitis Active Problems:   Chronic pain syndrome   Long term current use of anticoagulant   HTN (hypertension)   UTI (lower urinary tract infection)   Colonic diverticular abscess  Discharge Medications:   Medication List    TAKE these medications        albuterol 108 (90 BASE) MCG/ACT inhaler  Commonly known as:  PROVENTIL HFA;VENTOLIN HFA  Inhale 1-2 puffs into the lungs every 4 (four) hours as needed for wheezing or shortness of breath.     aspirin EC 81 MG tablet  Take 81 mg by mouth daily.     atorvastatin 40 MG tablet  Commonly known as:  LIPITOR  Take 1 tablet (40 mg total) by mouth at bedtime.     buPROPion 300 MG 24 hr tablet  Commonly known as:  WELLBUTRIN XL  Take 300 mg by mouth daily.     carvedilol 25 MG tablet  Commonly known as:  COREG  Take 1 tablet (25 mg total) by mouth 2 (two) times daily with a meal.     ciprofloxacin 500 MG tablet  Commonly known as:  CIPRO  Take 1 tablet (500 mg total) by mouth 2 (two) times daily.     diazepam 5 MG tablet  Commonly known as:  VALIUM  Take 10 mg by mouth 3 (three) times daily.     DULoxetine 60 MG capsule  Commonly known as:  CYMBALTA  Take 60 mg by mouth daily.     gabapentin 800 MG tablet  Commonly known as:  NEURONTIN  Take 1 tablet in the morning, 1 tablet midday and 2 tablets at bedtime     GEODON 60 MG capsule  Generic drug:  ziprasidone  Take 60 mg by mouth 2 (two) times daily with a meal.     hydrochlorothiazide 12.5 MG tablet  Commonly known as:  HYDRODIURIL  Take 1 tablet (12.5 mg total) by mouth daily.     hydrOXYzine 25 MG tablet  Commonly known as:  ATARAX/VISTARIL  Take 25 mg by mouth 3 (three) times daily as needed for  anxiety.     lisinopril 20 MG tablet  Commonly known as:  PRINIVIL,ZESTRIL  Take 1 tablet (20 mg total) by mouth 2 (two) times daily.     metroNIDAZOLE 500 MG tablet  Commonly known as:  FLAGYL  Take 1 tablet (500 mg total) by mouth 3 (three) times daily.     ONE-A-DAY 50 PLUS PO  Take 1 tablet by mouth daily.     potassium chloride 10 MEQ tablet  Commonly known as:  K-DUR  TAKE 1 TABLET BY MOUTH EVERY DAY     warfarin 7.5 MG tablet  Commonly known as:  COUMADIN  Take 3.75-7.5 mg by mouth daily. Take 3.75mg  alternating with 7.5mg  daily        Disposition and follow-up:   [facility] in Critical condition.  At the hospital follow up visit please address:  1.  IR placed drain, diverticulitis and antibiotic therapy  2.  Labs / imaging needed at time of follow-up: CBC, BMP, repeat CT  3.  Pending labs/ test needing follow-up: Abscess culture and gram stain, final blood culture   Follow-up Appointments:  Follow-up Information    Follow up with Erroll Luna A., MD On 02/27/2015.   Specialty:  General Surgery   Why:  @ 11 am   Contact information:   [place] [address] [phone]       Follow up with Jerene Pitch, MD On 02/10/2015.   Specialty:  Internal Medicine   Why:  @ 2:15 pm   Contact information:   [place] [address] [phone]       Discharge Instructions: Discharge Instructions    Diet - low sodium heart healthy    Complete by:  As directed      Increase activity slowly    Complete by:  As directed            Consultations: Treatment Team:  Md Ccs, MD  Procedures Performed:  Ct Abdomen Pelvis W Contrast  02/07/2015   CLINICAL DATA:  Lower abdominal pain.  EXAM: CT ABDOMEN AND PELVIS WITH CONTRAST  TECHNIQUE: Multidetector CT imaging of the abdomen and pelvis was performed using the standard protocol following bolus administration of intravenous contrast.   CONTRAST:  169mL OMNIPAQUE IOHEXOL 300 MG/ML  SOLN  COMPARISON:  CT scan of February 06, 2015.  FINDINGS: Visualized lung bases appear normal. No significant osseous abnormality is noted.  No gallstones are noted. The liver, spleen and pancreas appear normal. Adrenal glands appear normal. Small right renal cyst is noted. No hydronephrosis or renal obstruction is noted. Atherosclerotic calcifications of abdominal aorta are noted without aneurysm formation. The appendix appears normal. 7.8 x 4.6 cm complex fluid collection is seen inferior to inflamed portion of sigmoid colon consistent with diverticular abscess. There is no evidence of bowel obstruction. Urinary bladder appears normal. Calcified fibroid is noted in the uterus. Mild fat containing periumbilical hernia is noted with inflammatory stranding.  IMPRESSION: Diverticulitis is sigmoid colon is noted with adjacent 7.8 x 4.6 cm complex fluid collection consistent with diverticular abscess.  Mild fat containing periumbilical hernia is noted which may be inflamed.   Electronically Signed   By: Marijo Conception, M.D.   On: 02/07/2015 20:12   Ct Angio Ao+bifem W/cm &/or Wo/cm  02/06/2015   CLINICAL DATA:  Back pain, nausea and fever. History of peripheral vascular disease with known chronic SFA occlusions and right external iliac artery stenosis.  EXAM: CT ANGIOGRAPHY OF ABDOMINAL AORTA WITH ILIOFEMORAL RUNOFF  TECHNIQUE: Multidetector CT imaging of the abdomen, pelvis and lower extremities was performed using the standard protocol during bolus administration of intravenous contrast. Multiplanar CT image reconstructions and MIPs were obtained to evaluate the vascular anatomy.  CONTRAST:  148mL OMNIPAQUE IOHEXOL 350 MG/ML SOLN  COMPARISON:  10/03/2008  FINDINGS: Aorta: The abdominal aorta is normally patent and demonstrates mild plaque. No evidence of aortic aneurysm or stenosis. The celiac axis, superior mesenteric artery and inferior mesenteric artery show stable  and normal patency. Midportion of the left renal artery demonstrates stable moderate focal stenosis of approximately 50% narrowing. Mild progression of plaque in the proximal right renal artery without significant stenosis identified.  Right Lower Extremity: Calcified plaque present in the common iliac artery without significant stenosis. Previously identified focal stenosis of the mid right external iliac artery has been stented with a widely patent stent present. The common femoral artery shows distal stenosis of approximately 50%. The profunda femoral artery is normally patent. The SFA is chronically occluded at its origin. Distal SFA is reconstituted by collaterals at the adductor hiatus. The popliteal  artery is normally patent. Three-vessel runoff is demonstrated into the foot with dominant anterior tibial runoff.  Left Lower Extremity: Calcified plaque is present in the common and external iliac arteries without significant stenosis identified. Distal common femoral artery becomes of larger caliber, likely related to prior patch angioplasty. The native SFA is chronically occluded. The profunda femoral artery is open. The popliteal artery is reconstituted above the knee. Peroneal posterior tibial arteries are open into the foot. The anterior tibial artery is open but poorly opacified and diseased with calcified plaque present.  Arterial phase evaluation of the solid organs is unremarkable including the liver, gallbladder, pancreas, spleen, adrenal glands and kidneys.  There is thickening and inflammation involving the sigmoid colon. Lumen is difficult to follow in the pelvis and there may be some developing areas of abscess formation. Findings are most likely consistent with diverticulitis. Mass cannot be excluded. Follow-up imaging with CT of the abdomen and pelvis during venous phase contrast opacification and with oral contrast may be helpful. No free intraperitoneal air is identified. No enlarged lymph  nodes are seen.  Review of the MIP images confirms the above findings.  IMPRESSION: 1. Evidence of probable diverticulitis involving the sigmoid colon. There may be developing adjacent diverticular abscess is predominantly posterior and inferior to the sigmoid colon. This is difficult to delineate without oral contrast on poor and during the arterial phase CTA. Mass of the colon cannot be excluded on the current study. Follow-up with venous phase CT of the abdomen and pelvis with oral contrast would be helpful. 2. 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 3. Chronic bilateral SFA occlusions.   Electronically Signed   By: Aletta Edouard M.D.   On: 02/06/2015 17:19   Ct Image Guided Drainage By Percutaneous Catheter  02/09/2015   CLINICAL DATA:  Left lower quadrant diverticular abscess  EXAM: CT GUIDED DRAINAGE OF LEFT LOWER QUADRANT DIVERTICULAR ABSCESS  ANESTHESIA/SEDATION: 1.5 Mg IV Versed 75 mcg IV Fentanyl  Total Moderate Sedation Time:  15 minutes  PROCEDURE: The procedure, risks, benefits, and alternatives were explained to the patient. Questions regarding the procedure were encouraged and answered. The patient understands and consents to the procedure.  The left lower quadrant was prepped with Betadinein a sterile fashion, and a sterile drape was applied covering the operative field. A sterile gown and sterile gloves were used for the procedure. Local anesthesia was provided with 1% Lidocaine.  Previous imaging reviewed. Patient position left anterior oblique. Initially noncontrast localization CT performed. The ill-defined air-fluid collection adjacent to the sigmoid colon is difficult to delineate from the adjacent bowel. For better visualization, 100 cc Omnipaque 300 contrast administered. Repeat imaging performed through the pelvis. Following contrast, the pelvic abscess is better visualized in relation to the adjacent sigmoid. Under sterile conditions and local anesthesia, an 18 gauge 15  cm access needle was advanced into the abscess. Guidewire advanced followed by tract dilatation to insert a 10 [place] drain. Drain catheter position confirmed with CT. Syringe aspiration yielded purulent fluid. Sample sent for Gram stain and culture. Catheter secured with a Prolene suture and connected to external suction bulb. Sterile dressing applied. No immediate complication. Patient tolerated the procedure well.  COMPLICATIONS: None immediate  FINDINGS: Imaging confirms needle access for insertion of a left lower quadrant diverticular abscess drain adjacent to the sigmoid colon.  IMPRESSION: Successful CT-guided left lower quadrant diverticular abscess drain insertion.   Electronically Signed   By: Jerilynn Mages.  Shick M.D.   On: 02/09/2015 11:45  Admission HPI: 54 yo female with hx of DVT on warfarin, chronic leg pain, anxiety/depression, HLD, HTN, PVD (fem bypass, stent) , hx of sigmoid diverticulitis 2013, presents with complaint of ab pain and urinary discomfort for last 4 days. Its located on the flank area, starting from her back radiating down to lower ab on both sides. It's sharp and intense, also crampy, no relief with tylenol at home. She has burning with urination, mild urgency and frequency. Has low Also had n/v, nb/nb, but has resolved. Denies diarrhea. Had subjective fever at home, denies cp or SOB. Hasn't been eating for last 3-4 days at home due to these symptoms.  Has WBC 16.6. BMP is normal. INR 1.16. UTI positive for many bacteria, protein >300, small leuk. CT angio ab aorta was done since she has hx of vascular problems, showing acute diverticulitis at sigmoid colon, unclear if there is an abscess. No free air. Kidneys unremarkable.  Hospital Course by problem list: Principal Problem:   Diverticulitis Active Problems:   Chronic pain syndrome   Long term current use of anticoagulant   HTN (hypertension)   UTI (lower urinary tract infection)   Colonic diverticular abscess     #Diverticulitis with Abscess: Ms Sandora has sigmoid diverticulitis and CT w oral contrast demonstrated adjacent 7.8 x 4.6 cm complex fluid collection consistent with diverticular abscess. She had successful placement of an IR drain with no complications the morning of 02/09/15. Sample was sent for culture and gram stain which are pending. She notes symptomatic improvement but unclear if this is true as she was very anxious about waiting to leave. She had one fever of 38.2C with blood cultures drawn and no growth to date. Her leukocytosis with peak 17.6 has been trending down and was down to 12.1 prior to leaving AMA. She has been on cipro/flagyl iv.   For the past two days, Ms Nappi has been insisting that she has to leave against medical advice. She says she has a home issue that cannot be dealt with remotely. She has refused to tell any RN or MD what this issue is. She was seen by social work and also refused to tell them. She told me that she feels safe at home. We offered to call police to help with issue but she refused. She is adamant that she must leave and had previously threatened to leave the nights of 2/9 and 2/10 (see separate documentation). We made it very clear to Ms Winterhalter that she would be leaving against medical advice as she has a drain recently placed which is an infection risk. She was told several times that the infection could become worse and spread through her body and she could die. She still insisted she was leaving so a total of 14 days of ciprofloxacin 500 mg bid and flagyl 500 mg tid was sent to her pharmacy. I explained to her that this would not adequately treat her infection as she is now on iv antibiotics but this would be better than nothing. We also scheduled follow-up appointments with internal medicine and surgery should she leave AMA as a safety net but stressed that she should not leave. RN counseled her on drain hygiene.  Ms Gerdts has decided to leave against medical advice.  Shet has a normal mental status, and understands the risks of leaving, including permanent disability and/or death, and has had an opportunity to ask questions about her medical condition. She has been informed that she may return for care at  any time, and follow up has been arranged.  #UTI: Ms Toro complained of dysuria on presentation with many bacteria on UA as well as flank pain but normal kidneys on CT. She is on cipro/flagyl for diverticulitis and the cipro will cover for possible UTI. She remains asymptomatic. Urine culture grew >100,000 colonies but no predominant bacterial morphotypes.  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 She was subtherapeutic with INR 1.21 on presentation and started on a heparin bridge. The warfarin was held the night before her procedure and heparin the morning of the procedure. Surgery said she may resume the anticoagulation following the procedure. Her INR was 1.38 the morning of discharge.   #CHF: Echo 2007 45% EF per EMR. At home she is on ASA 81 mg daily, carvedilol 25 mg bid, lisinopril 20 mg bid, KDur 10 mEq which were continued.  #CAD - 02/10/14 stress test low risk but anterior abn, Cath on 02/25/14 nonobstructive CAD, normal LV function 55-65%.  At home she is on ASA 81 mg daily, carvedilol 25 mg bid, lisinopril 20 mg bid, atorvastatin 40 mg daily which were continued.  #HTN: BP was normotensive to slightly elevated while in the hospital. At home she is on ASA 81 mg daily, carvedilol 25 mg bid, lisinopril 20 mg bid, HCTZ 12.5 mg daily, atorvastatin 40 mg daily which were continued.  #HLD: Last lipid profile 10/01/2011 w cholesterol 162, triglyceride 205, HDL 25, LDL 96. At home she is on atorvastatin 40 mg daily which was continued.  Final Vitals:   BP 131/76 mmHg  Pulse 70  Temp(Src) 98.4 F (36.9 C) (Oral)  Resp 20  Ht 5' 4.8" (1.646 m)  Wt 176 lb 5.9 oz (80 kg)  BMI 29.53 kg/m2  SpO2 96%  LMP   (LMP Unknown)   Final Physical Exam: Gen: A&O x 4, very anxious lying in bed, well developed, well nourished HEENT: Atraumatic, PERRL, EOMI, sclerae anicteric, moist mucous membranes Heart: Regular rate and rhythm, normal S1 S2, no murmurs, rubs, or gallops Lungs: Clear to auscultation bilaterally, respirations unlabored Abd: Soft, diffuse tenderness on palpation, IR drain with small amount of serosanguinous output, non-distended, + bowel sounds, no hepatosplenomegaly Ext: No edema or cyanosis  FInal Labs:  Basic Metabolic Panel:  Recent Labs Lab 02/08/15 0821 02/09/15 0649  NA 138 138  K 3.6 3.6  CL 103 104  CO2 23 25  GLUCOSE 86 117*  BUN 6 <5*  CREATININE 0.74 0.73  CALCIUM 8.8 8.8   Liver Function Tests:  Recent Labs Lab 02/06/15 1026  AST 20  ALT 16  ALKPHOS 72  BILITOT 1.5*  PROT 7.2  ALBUMIN 3.1*    Recent Labs Lab 02/06/15 1026  LIPASE 21   CBC:  Recent Labs Lab 02/06/15 1026  02/08/15 0821 02/09/15 0649  WBC 16.6*  < > 15.2* 12.1*  NEUTROABS 13.1*  --   --   --   HGB 14.4  < > 13.2 12.2  HCT 41.3  < > 39.7 37.5  MCV 94.5  < > 97.5 98.4  PLT 212  < > 206 241  < > = values in this interval not displayed. Coagulation:  Recent Labs Lab 02/06/15 1026 02/07/15 0336 02/08/15 0821 02/09/15 0649  LABPROT 14.9 15.4* 18.1* 17.1*  INR 1.16 1.21 1.48 1.38   Urine Drug Screen: Drugs of Abuse  Urinalysis:  Recent Labs Lab 02/06/15 1159  COLORURINE AMBER*  LABSPEC 1.040*  PHURINE 6.0  GLUCOSEU NEGATIVE  HGBUR NEGATIVE  BILIRUBINUR LARGE*  KETONESUR 40*  PROTEINUR >300*  UROBILINOGEN 1.0  NITRITE NEGATIVE  LEUKOCYTESUR SMALL*    Signed: Kelby Aline, MD 02/09/2015, 2:59 PM    Services Ordered on AMA leave: home health RN Equipment Ordered on AMA leave: none

## 2015-02-09 NOTE — Progress Notes (Signed)
Patient ID: Alisha [name], female   DOB: 01-03-1961, 54 y.o.   MRN: [id number]     [facility]      [address] [place], [place] [address]    Phone: [phone] FAX: [phone]     Subjective: Wants to go home.  Afebrile. WBC down.  S/p drain placement this AM.    Objective:  Vital signs:  Filed Vitals:   02/09/15 0845 02/09/15 0850 02/09/15 0855 02/09/15 0956  BP: 141/86   141/81  Pulse: 73 74 76 75  Temp:    97.8 F (36.6 C)  TempSrc:    Oral  Resp: _0 Height:      Weight:      SpO2: 95% 97% 97% 98%    Last BM Date: 02/06/15  Intake/Output   Yesterday:  02/10 0701 - 02/11 0700 In: 2360 [P.O.:960; IV Piggyback:1400] Out: 1100 [Urine:1100] This shift:  Total I/O In: -  Out: 12 [Drains:12]   Physical Exam: General: Pt awake/alert/oriented x4 in no acute distress Abdomen: Soft.  Nondistended.  Non tender.  Drain with serosanguinous output.   No evidence of peritonitis.  No incarcerated hernias.    Problem List:   Principal Problem:   Diverticulitis Active Problems:   Chronic pain syndrome   Long term current use of anticoagulant   HTN (hypertension)   UTI (lower urinary tract infection)   Colonic diverticular abscess    Results:   Labs: Results for orders placed or performed during the hospital encounter of 02/06/15 (from the past 48 hour(s))  Heparin level (unfractionated)     Status: Abnormal   Collection Time: 02/07/15 12:50 PM  Result Value Ref Range   Heparin Unfractionated 0.13 (L) 0.30 - 0.70 IU/mL    Comment:        IF HEPARIN RESULTS ARE BELOW EXPECTED VALUES, AND PATIENT DOSAGE HAS BEEN CONFIRMED, SUGGEST FOLLOW UP TESTING OF ANTITHROMBIN III LEVELS.   Heparin level (unfractionated)     Status: Abnormal   Collection Time: 02/07/15  8:55 PM  Result Value Ref Range   Heparin Unfractionated 0.20 (L) 0.30 - 0.70 IU/mL    Comment:        IF HEPARIN RESULTS ARE  BELOW EXPECTED VALUES, AND PATIENT DOSAGE HAS BEEN CONFIRMED, SUGGEST FOLLOW UP TESTING OF ANTITHROMBIN III LEVELS.   CBC     Status: Abnormal   Collection Time: 02/08/15  8:21 AM  Result Value Ref Range   WBC 15.2 (H) 4.0 - 10.5 K/uL   RBC 4.07 3.87 - 5.11 MIL/uL   Hemoglobin 13.2 12.0 - 15.0 g/dL   HCT 39.7 36.0 - 46.0 %   MCV 97.5 78.0 - 100.0 fL   MCH 32.4 26.0 - 34.0 pg   MCHC 33.2 30.0 - 36.0 g/dL   RDW 13.1 11.5 - 15.5 %   Platelets 206 150 - 400 K/uL  Protime-INR     Status: Abnormal   Collection Time: 02/08/15  8:21 AM  Result Value Ref Range   Prothrombin Time 18.1 (H) 11.6 - 15.2 seconds   INR 1.48 0.00 - 3.54  Basic metabolic panel     Status: None   Collection Time: 02/08/15  8:21 AM  Result Value Ref Range   Sodium 138 135 - 145 mmol/L   Potassium 3.6 3.5 - 5.1 mmol/L   Chloride 103 96 - 112 mmol/L   CO2 23 19 - 32 mmol/L   Glucose, Bld 86 70 -  99 mg/dL   BUN 6 6 - 23 mg/dL   Creatinine, Ser 0.74 0.50 - 1.10 mg/dL   Calcium 8.8 8.4 - 10.5 mg/dL   GFR calc non Af Amer >90 >90 mL/min   GFR calc Af Amer >90 >90 mL/min    Comment: (NOTE) The eGFR has been calculated using the CKD EPI equation. This calculation has not been validated in all clinical situations. eGFR's persistently <90 mL/min signify possible Chronic Kidney Disease.    Anion gap 12 5 - 15  Heparin level (unfractionated)     Status: Abnormal   Collection Time: 02/08/15  8:21 AM  Result Value Ref Range   Heparin Unfractionated 0.22 (L) 0.30 - 0.70 IU/mL    Comment:        IF HEPARIN RESULTS ARE BELOW EXPECTED VALUES, AND PATIENT DOSAGE HAS BEEN CONFIRMED, SUGGEST FOLLOW UP TESTING OF ANTITHROMBIN III LEVELS.   Culture, blood (routine x 2)     Status: None (Preliminary result)   Collection Time: 02/08/15  8:36 AM  Result Value Ref Range   Specimen Description BLOOD RIGHT HAND    Special Requests BOTTLES DRAWN AEROBIC ONLY 5CC    Culture             BLOOD CULTURE RECEIVED NO GROWTH TO  DATE CULTURE WILL BE HELD FOR 5 DAYS BEFORE ISSUING A FINAL NEGATIVE REPORT Performed at [facility]    Report Status PENDING   Culture, blood (routine x 2)     Status: None (Preliminary result)   Collection Time: 02/08/15  8:48 AM  Result Value Ref Range   Specimen Description BLOOD LEFT HAND    Special Requests BOTTLES DRAWN AEROBIC ONLY 5CC    Culture             BLOOD CULTURE RECEIVED NO GROWTH TO DATE CULTURE WILL BE HELD FOR 5 DAYS BEFORE ISSUING A FINAL NEGATIVE REPORT Performed at [facility]    Report Status PENDING   Heparin level (unfractionated)     Status: None   Collection Time: 02/08/15  7:25 PM  Result Value Ref Range   Heparin Unfractionated 0.51 0.30 - 0.70 IU/mL    Comment:        IF HEPARIN RESULTS ARE BELOW EXPECTED VALUES, AND PATIENT DOSAGE HAS BEEN CONFIRMED, SUGGEST FOLLOW UP TESTING OF ANTITHROMBIN III LEVELS.   Heparin level (unfractionated)     Status: Abnormal   Collection Time: 02/09/15  6:49 AM  Result Value Ref Range   Heparin Unfractionated 0.23 (L) 0.30 - 0.70 IU/mL    Comment:        IF HEPARIN RESULTS ARE BELOW EXPECTED VALUES, AND PATIENT DOSAGE HAS BEEN CONFIRMED, SUGGEST FOLLOW UP TESTING OF ANTITHROMBIN III LEVELS.   Protime-INR     Status: Abnormal   Collection Time: 02/09/15  6:49 AM  Result Value Ref Range   Prothrombin Time 17.1 (H) 11.6 - 15.2 seconds   INR 1.38 0.00 - 1.49  CBC     Status: Abnormal   Collection Time: 02/09/15  6:49 AM  Result Value Ref Range   WBC 12.1 (H) 4.0 - 10.5 K/uL   RBC 3.81 (L) 3.87 - 5.11 MIL/uL   Hemoglobin 12.2 12.0 - 15.0 g/dL   HCT 37.5 36.0 - 46.0 %   MCV 98.4 78.0 - 100.0 fL   MCH 32.0 26.0 - 34.0 pg   MCHC 32.5 30.0 - 36.0 g/dL   RDW 13.2 11.5 - 15.5 %   Platelets 241  150 - 400 K/uL  Basic metabolic panel     Status: Abnormal   Collection Time: 02/09/15  6:49 AM  Result Value Ref Range   Sodium 138 135 - 145 mmol/L   Potassium 3.6 3.5 - 5.1 mmol/L   Chloride 104  96 - 112 mmol/L   CO2 25 19 - 32 mmol/L   Glucose, Bld 117 (H) 70 - 99 mg/dL   BUN <5 (L) 6 - 23 mg/dL   Creatinine, Ser 0.73 0.50 - 1.10 mg/dL   Calcium 8.8 8.4 - 10.5 mg/dL   GFR calc non Af Amer >90 >90 mL/min   GFR calc Af Amer >90 >90 mL/min    Comment: (NOTE) The eGFR has been calculated using the CKD EPI equation. This calculation has not been validated in all clinical situations. eGFR's persistently <90 mL/min signify possible Chronic Kidney Disease.    Anion gap 9 5 - 15    Imaging / Studies: Ct Abdomen Pelvis W Contrast  02/07/2015   CLINICAL DATA:  Lower abdominal pain.  EXAM: CT ABDOMEN AND PELVIS WITH CONTRAST  TECHNIQUE: Multidetector CT imaging of the abdomen and pelvis was performed using the standard protocol following bolus administration of intravenous contrast.  CONTRAST:  17m OMNIPAQUE IOHEXOL 300 MG/ML  SOLN  COMPARISON:  CT scan of February 06, 2015.  FINDINGS: Visualized lung bases appear normal. No significant osseous abnormality is noted.  No gallstones are noted. The liver, spleen and pancreas appear normal. Adrenal glands appear normal. Small right renal cyst is noted. No hydronephrosis or renal obstruction is noted. Atherosclerotic calcifications of abdominal aorta are noted without aneurysm formation. The appendix appears normal. 7.8 x 4.6 cm complex fluid collection is seen inferior to inflamed portion of sigmoid colon consistent with diverticular abscess. There is no evidence of bowel obstruction. Urinary bladder appears normal. Calcified fibroid is noted in the uterus. Mild fat containing periumbilical hernia is noted with inflammatory stranding.  IMPRESSION: Diverticulitis is sigmoid colon is noted with adjacent 7.8 x 4.6 cm complex fluid collection consistent with diverticular abscess.  Mild fat containing periumbilical hernia is noted which may be inflamed.   Electronically Signed   By: JMarijo Conception M.D.   On: 02/07/2015 20:12    Medications / Allergies:   Scheduled Meds: . aspirin EC  81 mg Oral Daily  . atorvastatin  40 mg Oral QHS  . buPROPion  300 mg Oral Daily  . carvedilol  25 mg Oral BID WC  . ciprofloxacin  400 mg Intravenous Q12H  . diazepam  10 mg Oral TID  . DULoxetine  60 mg Oral Daily  . feeding supplement (RESOURCE BREEZE)  1 Container Oral BID BM  . fentaNYL      . gabapentin  800 mg Oral BID  . hydrochlorothiazide  12.5 mg Oral Daily  . lidocaine      . lisinopril  20 mg Oral BID  . metronidazole  500 mg Intravenous Q8H  . midazolam      . potassium chloride  10 mEq Oral Daily  . warfarin  5 mg Oral ONCE-1800  . Warfarin - Pharmacist Dosing Inpatient   Does not apply q1800  . ziprasidone  60 mg Oral BID WC   Continuous Infusions:  PRN Meds:.HYDROmorphone (DILAUDID) injection, hydrOXYzine  Antibiotics: Anti-infectives    Start     Dose/Rate Route Frequency Ordered Stop   02/07/15 0600  ciprofloxacin (CIPRO) IVPB 400 mg     400 mg 200 mL/hr over 60 Minutes  Intravenous Every 12 hours 02/06/15 2244     02/07/15 0000  ciprofloxacin (CIPRO) 500 MG tablet     500 mg Oral 2 times daily 02/07/15 1634     02/07/15 0000  metroNIDAZOLE (FLAGYL) 500 MG tablet     500 mg Oral 3 times daily 02/07/15 1634     02/06/15 1800  ciprofloxacin (CIPRO) IVPB 400 mg     400 mg 200 mL/hr over 60 Minutes Intravenous  Once 02/06/15 1753 02/06/15 1916   02/06/15 1800  metroNIDAZOLE (FLAGYL) IVPB 500 mg     500 mg 100 mL/hr over 60 Minutes Intravenous  Once 02/06/15 1753 02/06/15 2211   02/06/15 1300  cefTRIAXone (ROCEPHIN) 1 g in dextrose 5 % 50 mL IVPB     1 g 100 mL/hr over 30 Minutes Intravenous  Once 02/06/15 1249 02/06/15 1352   02/06/15 0200  metroNIDAZOLE (FLAGYL) IVPB 500 mg     500 mg 100 mL/hr over 60 Minutes Intravenous Every 8 hours 02/06/15 2244          Assessment/Plan Sigmoid diverticulitis with large abscess S/i IR perc drain placement -c/w cipro flagyl -NPO, will consider starting clears -IVF -will need  repeat CT scan  -await cultures -will follow  UTI CAD CHF HTN  Patient wants to be discharged.  Cannot recommend DC at this time.    Erby Pian, [facility] Pager [phone]) For consults and floor pages call [phone])  02/09/2015 10:21 AM

## 2015-02-09 NOTE — Progress Notes (Signed)
Pt wanting to leave hospital ama. Pt states they have an urgent situation outside of the hospital. Pt refuses to disclose situation outside of hospital. Social work and supervisor aware of situation, Teaching to pt done about importance of care plan and treatment of iv antibiotis.

## 2015-02-09 NOTE — Progress Notes (Signed)
I was called by RN regarding Ms Lame attempting to leave AMA at approximately 1:20 pm and immediately went to bedside to speak to her. She was in street clothes standing by the doorway with her IV pole and IR drain hanging out of her shirt. She insists that she is leaving AMA but again refused to give a reason. I spoke to her in private and she told me she feels safe at home and no one is trying to take her home away. 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 She says she did not leave earlier because she was misled that she would not be here this long and she is leaving now and cannot be talked out of it. I asked her what she plans to do with drain and she said she will figure it out later. I told her that her infection can become worse and she may even die and she said this will not stop her from leaving. I asked her to please at least wait until steps are taken to ensure her safety such as sending antibiotics to her pharmacy and confirming follow-up appointments. She was unsure if she would agree to this. RN will instruct how to flush the drain but patient clearly instructed by several people that she is leaving against medical advice. See previous notes over past several days regarding her threats to leave AMA.  [name], MD 02/09/15 1:43 pm

## 2015-02-09 NOTE — Care Management Note (Signed)
    Page 1 of 1   02/10/2015     2:41:46 PM CARE MANAGEMENT NOTE 02/10/2015  Patient:  EMREY, THORNLEY   Account Number:  [id number]  Date Initiated:  02/07/2015  Documentation initiated by:  [facility]  Subjective/Objective Assessment:   Diverticulitis, UTI     Action/Plan:   Anticipated DC Date:  02/10/2015   Anticipated DC Plan:  [place]  CM consult      Choice offered to / List presented to:             Status of service:  Completed, signed off Medicare Important Message given?  YES (If response is "NO", the following Medicare IM given date fields will be blank) Date Medicare IM given:  02/09/2015 Medicare IM given by:  Tomi Bamberger Date Additional Medicare IM given:   Additional Medicare IM given by:    Discharge Disposition:  [place]  Per UR Regulation:  Reviewed for med. necessity/level of care/duration of stay  If discussed at [place] of Stay Meetings, dates discussed:    Comments:  02/07/2015 1452 Utilization review complete. Chart reviewed. Jonnie Finner RN CCM Case Mgmt phone [phone]  02/10/15 [place], BSN [phone] patient left AMA.

## 2015-02-09 NOTE — Clinical Documentation Improvement (Signed)
Presents with Diverticulitis and UTI; CHF is documented.   EF = 45%  Being treated with Lisinopril and Coreg PO daily  Last ECHO done in 2007  Please clarify the type of CHF your patient has and document findings in next progress note and include in discharge summary.  Chronic Systolic Congestive Heart Failure Chronic Diastolic Congestive Heart Failure Chronic Systolic & Diastolic Congestive Heart Failure Other Condition________________________________________  Angela Adam ,RN Clinical Documentation Specialist:  [place] Information Management

## 2015-02-10 ENCOUNTER — Encounter: Payer: Self-pay | Admitting: Internal Medicine

## 2015-02-10 ENCOUNTER — Ambulatory Visit (INDEPENDENT_AMBULATORY_CARE_PROVIDER_SITE_OTHER): Payer: Medicare Other | Admitting: Internal Medicine

## 2015-02-10 VITALS — BP 111/69 | HR 73 | Temp 97.9°F | Ht 64.5 in

## 2015-02-10 DIAGNOSIS — I10 Essential (primary) hypertension: Secondary | ICD-10-CM | POA: Diagnosis not present

## 2015-02-10 DIAGNOSIS — L918 Other hypertrophic disorders of the skin: Secondary | ICD-10-CM | POA: Diagnosis not present

## 2015-02-10 DIAGNOSIS — I809 Phlebitis and thrombophlebitis of unspecified site: Secondary | ICD-10-CM | POA: Diagnosis not present

## 2015-02-10 DIAGNOSIS — K5720 Diverticulitis of large intestine with perforation and abscess without bleeding: Secondary | ICD-10-CM | POA: Diagnosis not present

## 2015-02-10 DIAGNOSIS — Z720 Tobacco use: Secondary | ICD-10-CM

## 2015-02-10 DIAGNOSIS — Z7901 Long term (current) use of anticoagulants: Secondary | ICD-10-CM | POA: Diagnosis not present

## 2015-02-10 DIAGNOSIS — F17200 Nicotine dependence, unspecified, uncomplicated: Secondary | ICD-10-CM

## 2015-02-10 NOTE — Assessment & Plan Note (Addendum)
Left AMA from hospital yesterday despite getting IV antibiotics and JP drain in place. Presented today thinking she was going to surgery office but has appt 2/29. She refused admission again today but is taking cipro and flagyl. Denies any more seizures. Still having drainage.   Agreed to direct admission tomorrow morning 2/12 by 10am but refused admission tonight. Did not want any labs done, thus will defer labs and/or repeat CT imaging if needed to inpatient team Follow up with surgery as scheduled and likely during admission Discussed with bed control who will call patient when bed is reasy Continue cipro and flagyl and draining drain when full

## 2015-02-10 NOTE — Progress Notes (Addendum)
Subjective:   Patient ID: Alisha [name] female   DOB: May 30, 1961 54 y.o.   MRN: [id number]  HPI: Alisha [name] is a 54 y.o. female with PMH as listed below who presents to opc today for hospital follow up visit. She left the hospital against medical advise yesterday while being treated for diverticulitis complicated by abscess. She had a JP drain placed on left side that she left with when she left AMA. She reports the drainage in the drain is still thick and has been emptying it out even when it is not full. She has no fevers. No current abdominal pain. Has been taking her antibiotics she was discharged on. we discussed admission today for further treatment and evaluation since she left AMA but she again REFUSES to be admitted at this time and also REFUSES all lab work. She does however after further discussion agree to direct admission tomorrow morning around 10am. When asked why she is refusing admission today, she says she has things to take care of at home first. CNA is with her today who will bring her to hospital tomorrow. He thought she had a 230pm appointment with surgery today, however, when I called the office they did not have any record of such appointment and also checked with surgeon. She will keep her appt for 2/29.    Past Medical History  Diagnosis Date  . DVT (deep venous thrombosis)     bilateral, 2 episodes: Requires lifelong therapy  . Chronic pain syndrome   . Restless leg syndrome   . Depression   . Hyperlipidemia   . Hypertension   . PVD (peripheral vascular disease)     s/p L fem-pop bypass 11/21/08; graft occluded 12/28/08;  aortogram w/ bilat LE runoff: 1.  bilat diffuse SFA occlusive dz, 2.  Mod to severe above-knee popliteal dz,  3.  Bilat 3-vessel runoff w/ mild tibial occlusive dz.  . Tobacco abuse   . Degenerative joint disease of knee   . Breast lump 02/2008    Biopsy 05/2008: showed no evidence of malignancy  . Irregular menses 9/08  . Pulmonary edema   .  Anxiety   . Joint pain   . Mixed stress and urge urinary incontinence     Being followed by [facility], underwent cystoscopy & uroflowmetry   . Endometrial mass 10/12/2012    Endometrium, biopsy on 11/04/12 - PROLIFERATIVE ENDOMETRIUM AND ABUNDANT MUCUS. NO HYPERPLASIA OR CARCINOMA.   . Sigmoid diverticulitis 10/26/2012  . Asthma   . Breast mass in female     bi lat    Current Outpatient Prescriptions  Medication Sig Dispense Refill  . albuterol (PROVENTIL HFA;VENTOLIN HFA) 108 (90 BASE) MCG/ACT inhaler Inhale 1-2 puffs into the lungs every 4 (four) hours as needed for wheezing or shortness of breath. 3.7 g 2  . aspirin EC 81 MG tablet Take 81 mg by mouth daily.    Marland Kitchen atorvastatin (LIPITOR) 40 MG tablet Take 1 tablet (40 mg total) by mouth at bedtime. 90 tablet 3  . buPROPion (WELLBUTRIN XL) 300 MG 24 hr tablet Take 300 mg by mouth daily.    . carvedilol (COREG) 25 MG tablet Take 1 tablet (25 mg total) by mouth 2 (two) times daily with a meal. 180 tablet 3  . ciprofloxacin (CIPRO) 500 MG tablet Take 1 tablet (500 mg total) by mouth 2 (two) times daily. 22 tablet 0  . diazepam (VALIUM) 5 MG tablet Take 10 mg by mouth 3 (three) times  daily.     . DULoxetine (CYMBALTA) 60 MG capsule Take 60 mg by mouth daily.     Marland Kitchen gabapentin (NEURONTIN) 800 MG tablet Take 1 tablet in the morning, 1 tablet midday and 2 tablets at bedtime 120 tablet 0  . hydrochlorothiazide (HYDRODIURIL) 12.5 MG tablet Take 1 tablet (12.5 mg total) by mouth daily. 90 tablet 1  . hydrOXYzine (ATARAX/VISTARIL) 25 MG tablet Take 25 mg by mouth 3 (three) times daily as needed for anxiety.    Marland Kitchen lisinopril (PRINIVIL,ZESTRIL) 20 MG tablet Take 1 tablet (20 mg total) by mouth 2 (two) times daily. 180 tablet 1  . metroNIDAZOLE (FLAGYL) 500 MG tablet Take 1 tablet (500 mg total) by mouth 3 (three) times daily. 33 tablet 0  . Multiple Vitamins-Minerals (ONE-A-DAY 50 PLUS PO) Take 1 tablet by mouth daily.    . potassium chloride  (K-DUR) 10 MEQ tablet TAKE 1 TABLET BY MOUTH EVERY DAY 30 tablet 10  . warfarin (COUMADIN) 7.5 MG tablet Take 3.75-7.5 mg by mouth daily. Take 3.75mg  alternating with 7.5mg  daily    . ziprasidone (GEODON) 60 MG capsule Take 60 mg by mouth 2 (two) times daily with a meal.      No current facility-administered medications for this visit.   Family History  Problem Relation Age of Onset  . Breast cancer Mother   . Colon cancer Neg Hx   . Rectal cancer Neg Hx   . Stomach cancer Neg Hx   . Breast cancer Other     GREAT AUNT   History   Social History  . Marital Status: Divorced    Spouse Name: N/A  . Number of Children: 4  . Years of Education: N/A   Occupational History  . disable    Social History Main Topics  . Smoking status: Current Every Day Smoker -- 0.50 packs/day for 18 years    Types: Cigarettes  . Smokeless tobacco: Never Used     Comment: 3-6 per day  . Alcohol Use: Yes     Comment: rare  . Drug Use: No     Comment: hx of marijuana and cocaine use  . Sexual Activity: No   Other Topics Concern  . None   Social History Narrative   Patent examiner initiated. Patient needs to submit further paperwork to complete   [facility]  September 11, 2010 2:04 PM   Financial assistance approved for 100% discount at [facility] and has [facility] card   [name]  October 04, 2010 5:29 PM   Review of Systems:  Constitutional:  Denies fever, chills  Respiratory:  Denies SOB, DOE, cough, and wheezing.   Cardiovascular:  Denies chest pain  Gastrointestinal:  JP drain on left side  Genitourinary:  Denies dysuria  Musculoskeletal:  Chronic body pain, bumps on arms  Skin:  Mole on back  Neurological:  Denies headache.    Objective:  Physical Exam: Filed Vitals:   02/10/15 1506  BP: 111/69  Pulse: 73  Temp: 97.9 F (36.6 C)  TempSrc: Oral  Height: 5' 4.5" (1.638 m)  SpO2: 99%   Vitals reviewed. General: sitting in wheel chair, NAD HEENT: EOMI Cardiac:  RRR Pulm: clear to auscultation bilaterally Abd: soft, nontender, BS present, left JP drain in place with dressing intact, non-tender to palpation, sero-sanguinous drainage in drain Ext: moving all extremities. Mole on back that looks like enlarged skin tag that is growing per patient. No tenderness to palpation or erythema, soft, skin colored. +palpable  raised areas mainly on left arm at prior sites of iv. Erythema to area and mid tenderness, no drainage. Neuro: alert and oriented X3  Assessment & Plan:  Discussed with Dr. Dareen Piano She will come in for direct admit tomorrow morning by 10am--discussed with bed control who will contact patient when bed is ready. Discussed with admitting resident Dr. Redmond Pulling as well

## 2015-02-10 NOTE — Assessment & Plan Note (Addendum)
BP Readings from Last 3 Encounters:  02/10/15 111/69  02/09/15 131/76  08/29/14 145/81   Lab Results  Component Value Date   NA 138 02/09/2015   K 3.6 02/09/2015   CREATININE 0.73 02/09/2015   Assessment: Blood pressure control: controlled Progress toward BP goal:  at goal Comments:   Plan: Medications:  continue current medications coreg, hctz, lisinopril--could not verify doses with her today as she was very distracted and refusing admission but will need to be reviewed on follow up visit and while in the hospital. She did say that she takes all her medications

## 2015-02-10 NOTE — Assessment & Plan Note (Signed)
  Assessment: Progress toward smoking cessation:  smoking the same amount Barriers to progress toward smoking cessation:    Comments: still smoking  Plan: Instruction/counseling given:  I counseled patient on the dangers of tobacco use, advised patient to stop smoking, and reviewed strategies to maximize success. Educational resources provided:    Self management tools provided:    Medications to assist with smoking cessation:  None Patient agreed to the following self-care plans for smoking cessation:    Other plans: continue to encourage cessation

## 2015-02-10 NOTE — Assessment & Plan Note (Signed)
Refused INR check today but says takes coumadin daily and will have it checked tomorrow while in the hospital

## 2015-02-10 NOTE — Patient Instructions (Signed)
General Instructions:  Please bring your medicines with you each time you come to clinic.  Medicines may include prescription medications, over-the-counter medications, herbal remedies, eye drops, vitamins, or other pills.  Please return to the hospital by 10am tomorrow for direct admission, main floor of the hospital  In the meantime, if you develop fever, chills,abdominal pain, bleeding from your jp drain or increased drainage go directly to the emergency room  Please stop smoking  Progress Toward Treatment Goals:  Treatment Goal 02/10/2015  Blood pressure at goal  Stop smoking smoking the same amount  Prevent falls -    Self Care Goals & Plans:  Self Care Goal 08/11/2014  Manage my medications take my medicines as prescribed  Eat healthy foods eat foods that are low in salt; eat baked foods instead of fried foods  Be physically active find an activity I enjoy  Stop smoking cut down the number of cigarettes smoked; go to the [employer] (https://[employer].info/)  Prevent falls use home fall prevention checklist to improve safety    No flowsheet data found.   Care Management & Community Referrals:  Referral 10/22/2013  Referrals made for care management support none needed

## 2015-02-10 NOTE — Assessment & Plan Note (Signed)
Skin tag that is growing per patient on her back. No tenderness to palpation but she wants it removed. Given that she was here for an acute visit for her diverticulitis related abscess and the visit time was used to address that issue and her refusing admission again, will need further evaluation on follow up visit. If bothersome to patient, consider removal.

## 2015-02-11 ENCOUNTER — Encounter (HOSPITAL_COMMUNITY): Payer: Self-pay | Admitting: *Deleted

## 2015-02-11 ENCOUNTER — Inpatient Hospital Stay (HOSPITAL_COMMUNITY)
Admission: AD | Admit: 2015-02-11 | Discharge: 2015-02-15 | DRG: 392 | Disposition: A | Discharge status: Home / Self Care | Payer: Medicare Other | Source: Ambulatory Visit | Attending: Internal Medicine | Admitting: Internal Medicine

## 2015-02-11 DIAGNOSIS — I739 Peripheral vascular disease, unspecified: Secondary | ICD-10-CM | POA: Diagnosis present

## 2015-02-11 DIAGNOSIS — Z86718 Personal history of other venous thrombosis and embolism: Secondary | ICD-10-CM | POA: Diagnosis not present

## 2015-02-11 DIAGNOSIS — K5720 Diverticulitis of large intestine with perforation and abscess without bleeding: Secondary | ICD-10-CM | POA: Diagnosis present

## 2015-02-11 DIAGNOSIS — F17210 Nicotine dependence, cigarettes, uncomplicated: Secondary | ICD-10-CM | POA: Diagnosis present

## 2015-02-11 DIAGNOSIS — J45909 Unspecified asthma, uncomplicated: Secondary | ICD-10-CM | POA: Diagnosis present

## 2015-02-11 DIAGNOSIS — F329 Major depressive disorder, single episode, unspecified: Secondary | ICD-10-CM | POA: Diagnosis present

## 2015-02-11 DIAGNOSIS — Z9119 Patient's noncompliance with other medical treatment and regimen: Secondary | ICD-10-CM | POA: Diagnosis present

## 2015-02-11 DIAGNOSIS — M7989 Other specified soft tissue disorders: Secondary | ICD-10-CM | POA: Diagnosis not present

## 2015-02-11 DIAGNOSIS — I808 Phlebitis and thrombophlebitis of other sites: Secondary | ICD-10-CM | POA: Diagnosis not present

## 2015-02-11 DIAGNOSIS — E785 Hyperlipidemia, unspecified: Secondary | ICD-10-CM | POA: Diagnosis present

## 2015-02-11 DIAGNOSIS — I809 Phlebitis and thrombophlebitis of unspecified site: Secondary | ICD-10-CM | POA: Insufficient documentation

## 2015-02-11 DIAGNOSIS — Z7901 Long term (current) use of anticoagulants: Secondary | ICD-10-CM | POA: Diagnosis not present

## 2015-02-11 DIAGNOSIS — G894 Chronic pain syndrome: Secondary | ICD-10-CM | POA: Diagnosis present

## 2015-02-11 DIAGNOSIS — M179 Osteoarthritis of knee, unspecified: Secondary | ICD-10-CM | POA: Diagnosis present

## 2015-02-11 DIAGNOSIS — Z7982 Long term (current) use of aspirin: Secondary | ICD-10-CM

## 2015-02-11 DIAGNOSIS — I509 Heart failure, unspecified: Secondary | ICD-10-CM | POA: Diagnosis present

## 2015-02-11 DIAGNOSIS — I82619 Acute embolism and thrombosis of superficial veins of unspecified upper extremity: Secondary | ICD-10-CM

## 2015-02-11 DIAGNOSIS — Z452 Encounter for adjustment and management of vascular access device: Secondary | ICD-10-CM | POA: Diagnosis not present

## 2015-02-11 DIAGNOSIS — G2581 Restless legs syndrome: Secondary | ICD-10-CM | POA: Diagnosis present

## 2015-02-11 DIAGNOSIS — I82612 Acute embolism and thrombosis of superficial veins of left upper extremity: Secondary | ICD-10-CM | POA: Diagnosis present

## 2015-02-11 DIAGNOSIS — R197 Diarrhea, unspecified: Secondary | ICD-10-CM | POA: Diagnosis not present

## 2015-02-11 DIAGNOSIS — Z6831 Body mass index (BMI) 31.0-31.9, adult: Secondary | ICD-10-CM

## 2015-02-11 DIAGNOSIS — Z888 Allergy status to other drugs, medicaments and biological substances status: Secondary | ICD-10-CM

## 2015-02-11 DIAGNOSIS — F419 Anxiety disorder, unspecified: Secondary | ICD-10-CM | POA: Diagnosis present

## 2015-02-11 DIAGNOSIS — R109 Unspecified abdominal pain: Secondary | ICD-10-CM | POA: Diagnosis not present

## 2015-02-11 DIAGNOSIS — F418 Other specified anxiety disorders: Secondary | ICD-10-CM | POA: Diagnosis present

## 2015-02-11 DIAGNOSIS — E669 Obesity, unspecified: Secondary | ICD-10-CM | POA: Diagnosis present

## 2015-02-11 DIAGNOSIS — I10 Essential (primary) hypertension: Secondary | ICD-10-CM | POA: Diagnosis present

## 2015-02-11 DIAGNOSIS — K5780 Diverticulitis of intestine, part unspecified, with perforation and abscess without bleeding: Secondary | ICD-10-CM | POA: Diagnosis present

## 2015-02-11 DIAGNOSIS — I2510 Atherosclerotic heart disease of native coronary artery without angina pectoris: Secondary | ICD-10-CM | POA: Diagnosis present

## 2015-02-11 DIAGNOSIS — I82409 Acute embolism and thrombosis of unspecified deep veins of unspecified lower extremity: Secondary | ICD-10-CM | POA: Diagnosis not present

## 2015-02-11 DIAGNOSIS — N3946 Mixed incontinence: Secondary | ICD-10-CM | POA: Diagnosis present

## 2015-02-11 LAB — CBC
HEMATOCRIT: 38.2 % (ref 36.0–46.0)
HEMOGLOBIN: 12.7 g/dL (ref 12.0–15.0)
MCH: 32.1 pg (ref 26.0–34.0)
MCHC: 33.2 g/dL (ref 30.0–36.0)
MCV: 96.5 fL (ref 78.0–100.0)
PLATELETS: 289 10*3/uL (ref 150–400)
RBC: 3.96 MIL/uL (ref 3.87–5.11)
RDW: 12.9 % (ref 11.5–15.5)
WBC: 10.5 10*3/uL (ref 4.0–10.5)

## 2015-02-11 LAB — PROTIME-INR
INR: 1.8 — ABNORMAL HIGH (ref 0.00–1.49)
Prothrombin Time: 21.1 seconds — ABNORMAL HIGH (ref 11.6–15.2)

## 2015-02-11 LAB — COMPREHENSIVE METABOLIC PANEL
ALT: 11 U/L (ref 0–35)
AST: 17 U/L (ref 0–37)
Albumin: 2.8 g/dL — ABNORMAL LOW (ref 3.5–5.2)
Alkaline Phosphatase: 54 U/L (ref 39–117)
Anion gap: 9 (ref 5–15)
CALCIUM: 8.8 mg/dL (ref 8.4–10.5)
CHLORIDE: 102 mmol/L (ref 96–112)
CO2: 29 mmol/L (ref 19–32)
Creatinine, Ser: 0.77 mg/dL (ref 0.50–1.10)
GFR calc Af Amer: >90 mL/min (ref >90)
GFR calc non Af Amer: >90 mL/min (ref >90)
GLUCOSE: 92 mg/dL (ref 70–99)
Potassium: 4 mmol/L (ref 3.5–5.1)
SODIUM: 140 mmol/L (ref 135–145)
TOTAL PROTEIN: 6.2 g/dL (ref 6.0–8.3)
Total Bilirubin: 0.4 mg/dL (ref 0.3–1.2)

## 2015-02-11 MED ORDER — GABAPENTIN 400 MG PO CAPS
800.0000 mg | ORAL_CAPSULE | Freq: Two times a day (BID) | ORAL | Status: DC
  Administered 2015-02-11 – 2015-02-15 (×8): 800 mg via ORAL
  Filled 2015-02-11 (×11): qty 2

## 2015-02-11 MED ORDER — ATORVASTATIN CALCIUM 40 MG PO TABS
40.0000 mg | ORAL_TABLET | Freq: Every day | ORAL | Status: DC
  Administered 2015-02-11 – 2015-02-14 (×4): 40 mg via ORAL
  Filled 2015-02-11 (×5): qty 1

## 2015-02-11 MED ORDER — DIAZEPAM 5 MG PO TABS
10.0000 mg | ORAL_TABLET | Freq: Three times a day (TID) | ORAL | Status: DC
  Administered 2015-02-11 – 2015-02-13 (×4): 10 mg via ORAL
  Administered 2015-02-13: 5 mg via ORAL
  Administered 2015-02-14 – 2015-02-15 (×4): 10 mg via ORAL
  Filled 2015-02-11 (×10): qty 2

## 2015-02-11 MED ORDER — BUPROPION HCL ER (XL) 300 MG PO TB24
300.0000 mg | ORAL_TABLET | Freq: Every day | ORAL | Status: DC
  Administered 2015-02-11 – 2015-02-15 (×5): 300 mg via ORAL
  Filled 2015-02-11 (×5): qty 1

## 2015-02-11 MED ORDER — ALBUTEROL SULFATE (2.5 MG/3ML) 0.083% IN NEBU: 2.5000 mg | INHALATION_SOLUTION | RESPIRATORY_TRACT | Status: DC | PRN

## 2015-02-11 MED ORDER — HEPARIN BOLUS VIA INFUSION
2000.0000 [IU] | Freq: Once | INTRAVENOUS | Status: AC
  Administered 2015-02-11: 2000 [IU] via INTRAVENOUS
  Filled 2015-02-11: qty 2000

## 2015-02-11 MED ORDER — HEPARIN (PORCINE) IN NACL 100-0.45 UNIT/ML-% IJ SOLN
1150.0000 [IU]/h | INTRAMUSCULAR | Status: DC
  Administered 2015-02-11: 1150 [IU]/h via INTRAVENOUS
  Filled 2015-02-11: qty 250

## 2015-02-11 MED ORDER — CARVEDILOL 25 MG PO TABS
25.0000 mg | ORAL_TABLET | Freq: Two times a day (BID) | ORAL | Status: DC
  Administered 2015-02-11 – 2015-02-15 (×8): 25 mg via ORAL
  Filled 2015-02-11 (×10): qty 1

## 2015-02-11 MED ORDER — GABAPENTIN 400 MG PO CAPS
1600.0000 mg | ORAL_CAPSULE | Freq: Every day | ORAL | Status: DC
  Administered 2015-02-13 – 2015-02-14 (×3): 1600 mg via ORAL
  Filled 2015-02-11 (×5): qty 4

## 2015-02-11 MED ORDER — DULOXETINE HCL 60 MG PO CPEP
60.0000 mg | ORAL_CAPSULE | Freq: Every day | ORAL | Status: DC
  Administered 2015-02-11 – 2015-02-15 (×5): 60 mg via ORAL
  Filled 2015-02-11 (×5): qty 1

## 2015-02-11 MED ORDER — HYDROMORPHONE HCL 1 MG/ML IJ SOLN
0.5000 mg | INTRAMUSCULAR | Status: DC | PRN
  Administered 2015-02-11 – 2015-02-13 (×6): 0.5 mg via INTRAVENOUS
  Filled 2015-02-11 (×6): qty 1

## 2015-02-11 MED ORDER — METRONIDAZOLE IN NACL 5-0.79 MG/ML-% IV SOLN
500.0000 mg | Freq: Three times a day (TID) | INTRAVENOUS | Status: DC
  Administered 2015-02-11 – 2015-02-13 (×7): 500 mg via INTRAVENOUS
  Filled 2015-02-11 (×8): qty 100

## 2015-02-11 MED ORDER — CIPROFLOXACIN HCL 500 MG PO TABS
500.0000 mg | ORAL_TABLET | Freq: Two times a day (BID) | ORAL | Status: DC
  Administered 2015-02-11 – 2015-02-13 (×4): 500 mg via ORAL
  Filled 2015-02-11 (×6): qty 1

## 2015-02-11 MED ORDER — HYDROXYZINE HCL 25 MG PO TABS
25.0000 mg | ORAL_TABLET | Freq: Three times a day (TID) | ORAL | Status: DC | PRN
  Administered 2015-02-11: 25 mg via ORAL
  Filled 2015-02-11 (×2): qty 1

## 2015-02-11 MED ORDER — HEPARIN SODIUM (PORCINE) 5000 UNIT/ML IJ SOLN: 5000.0000 [IU] | Freq: Three times a day (TID) | INTRAMUSCULAR | Status: DC

## 2015-02-11 MED ORDER — CIPROFLOXACIN IN D5W 400 MG/200ML IV SOLN
400.0000 mg | Freq: Two times a day (BID) | INTRAVENOUS | Status: DC
  Administered 2015-02-11: 400 mg via INTRAVENOUS
  Filled 2015-02-11 (×2): qty 200

## 2015-02-11 MED ORDER — ASPIRIN EC 81 MG PO TBEC
81.0000 mg | DELAYED_RELEASE_TABLET | Freq: Every day | ORAL | Status: DC
  Administered 2015-02-11 – 2015-02-15 (×5): 81 mg via ORAL
  Filled 2015-02-11 (×5): qty 1

## 2015-02-11 MED ORDER — ZIPRASIDONE HCL 60 MG PO CAPS
60.0000 mg | ORAL_CAPSULE | Freq: Two times a day (BID) | ORAL | Status: DC
  Administered 2015-02-11 – 2015-02-15 (×8): 60 mg via ORAL
  Filled 2015-02-11 (×11): qty 1

## 2015-02-11 MED ORDER — SODIUM CHLORIDE 0.9 % IV SOLN
INTRAVENOUS | Status: DC
  Administered 2015-02-11 – 2015-02-12 (×3): via INTRAVENOUS

## 2015-02-11 NOTE — H&P (Signed)
Date: 02/11/2015               Patient Name:  Alisha [name] MRN: [id number]  DOB: Nov 20, 1961 Age / Sex: 54 y.o., female   PCP: Julious Oka, MD         Medical Service: Internal Medicine Teaching Service         Attending Physician: Dr. Bartholomew Crews, MD    First Contact: Dr. [name] Pager: [phone]  Second Contact: Dr. Duwaine Maxin Pager: [phone]       After Hours (After 5p/  First Contact Pager: [phone]  weekends / holidays): Second Contact Pager: [phone]   Chief Complaint: abdominal pain  History of Present Illness: Alisha [name] is a 54 yo female with hx of DVT on warfarin, chronic leg pain, anxiety/depression, HLD, HTN, PVD (fem bypass, stent) , hx of sigmoid diverticulitis 2013,who left AMA on 02/09/15 with diverticulitis s/p diverticular abscess drain placement same day who returns with abdominal pain. She has sigmoid diverticulitis and CT w oral contrast demonstrated adjacent 7.8 x 4.6 cm complex fluid collection consistent with diverticular abscess. She had successful placement of an IR drain with no complications the morning of 02/09/15. Sample was sent for culture and gram stain which are pending. She was on iv cipro and flagyl during her stay. For two days, Alisha [name] insisted she had to leave against medical advice. She said she has a home issue that cannot be dealt with remotely. She has refused to tell any RN or MD what this issue is. She was seen by social work and also refused to tell them. She told me that she feels safe at home. We offered to call police to help with issue but she refused. She was adamant that she must leave. We made it very clear to Alisha [name] that she would be leaving against medical advice as she has a drain recently placed which is an infection risk. She was told several times that the infection could become worse and spread through her body and she could die. She still insisted she was leaving so a total of 14 days of ciprofloxacin 500 mg bid and flagyl 500  mg tid was sent to her pharmacy. I explained to her that this would not adequately treat her infection as she is now on iv antibiotics but this would be better than nothing. We also scheduled follow-up appointments with internal medicine and surgery should she leave AMA as a safety net but stressed that she should not leave. RN counseled her on drain hygiene.  She returned to [facility] clinic on 2/12 but refused a readmission or any lab draws. She said she drainage was thick but she had no fevers or abdominal pain. She said she had been taking antibiotics as prescribed. She said she was agreeable to return today for direct admission.   Today, she returned with abdominal pain. She says it is a cramping feeling like she is in labor. It is worse in the lower abdomen. She has no nausea, emesis, fever. Last bowel movement two days ago. She said she has been flushing her drain at home and it has filled up about half full in a day. She said her house was "wide open" and her roommate had left. She said she will stay as long as it takes for proper treatment this time because she already has to change the locks on her house. She expressed fear that she might need a colostomy.  Meds: Current Facility-Administered Medications  Medication Dose Route Frequency Provider Last Rate Last Dose  . 0.9 %  sodium chloride infusion   Intravenous Continuous Francesca Oman, DO      . albuterol (PROVENTIL) (2.5 MG/3ML) 0.083% nebulizer solution 2.5 mg  2.5 mg Inhalation Q4H PRN Francesca Oman, DO      . aspirin EC tablet 81 mg  81 mg Oral Daily Francesca Oman, DO      . atorvastatin (LIPITOR) tablet 40 mg  40 mg Oral QHS Francesca Oman, DO      . buPROPion (WELLBUTRIN XL) 24 hr tablet 300 mg  300 mg Oral Daily Francesca Oman, DO      . carvedilol (COREG) tablet 25 mg  25 mg Oral BID WC Francesca Oman, DO      . ciprofloxacin (CIPRO) IVPB 400 mg  400 mg Intravenous Q12H Erika K Nicolsen, RPH      . diazepam (VALIUM) tablet 10 mg  10 mg  Oral TID Francesca Oman, DO      . DULoxetine (CYMBALTA) DR capsule 60 mg  60 mg Oral Daily Francesca Oman, DO      . HYDROmorphone (DILAUDID) injection 0.5 mg  0.5 mg Intravenous Q4H PRN Francesca Oman, DO      . hydrOXYzine (ATARAX/VISTARIL) tablet 25 mg  25 mg Oral TID PRN Francesca Oman, DO      . metroNIDAZOLE (FLAGYL) IVPB 500 mg  500 mg Intravenous Q8H Francesca Oman, DO      . ziprasidone (GEODON) capsule 60 mg  60 mg Oral BID WC Francesca Oman, DO        Allergies: Allergies as of 02/10/2015 - Review Complete 02/09/2015  Allergen Reaction Noted  . Propoxyphene n-acetaminophen Swelling    Past Medical History  Diagnosis Date  . DVT (deep venous thrombosis)     bilateral, 2 episodes: Requires lifelong therapy  . Chronic pain syndrome   . Restless leg syndrome   . Depression   . Hyperlipidemia   . Hypertension   . PVD (peripheral vascular disease)     s/p L fem-pop bypass 11/21/08; graft occluded 12/28/08;  aortogram w/ bilat LE runoff: 1.  bilat diffuse SFA occlusive dz, 2.  Mod to severe above-knee popliteal dz,  3.  Bilat 3-vessel runoff w/ mild tibial occlusive dz.  . Tobacco abuse   . Degenerative joint disease of knee   . Breast lump 02/2008    Biopsy 05/2008: showed no evidence of malignancy  . Irregular menses 9/08  . Pulmonary edema   . Anxiety   . Joint pain   . Mixed stress and urge urinary incontinence     [facility], underwent cystoscopy & uroflowmetry   . Endometrial mass 10/12/2012    Endometrium, biopsy on 11/04/12 - PROLIFERATIVE ENDOMETRIUM AND ABUNDANT MUCUS. NO HYPERPLASIA OR CARCINOMA.   . Sigmoid diverticulitis 10/26/2012  . Asthma   . Breast mass in female     bi lat    Past Surgical History  Procedure Laterality Date  . Femoral bypass  11/09  .  right external iliac artery stent    . Right breast needle-localized lumpectomy    . Pr vein bypass graft,aorto-fem-pop    . Carotid endarterectomy    . Tubal ligation    .  Tonsillectomy    . Cardiac catheterization  2/15  . Breast lumpectomy with radioactive seed localization Bilateral 05/03/2014    Procedure: BREAST LUMPECTOMY WITH RADIOACTIVE  SEED LOCALIZATION;  Surgeon: Joyice Faster. Cornett, MD;  Location: [place];  Service: General;  Laterality: Bilateral;  . Left heart catheterization with coronary angiogram N/A 02/25/2014    Procedure: LEFT HEART CATHETERIZATION WITH CORONARY ANGIOGRAM;  Surgeon: Peter M Martinique, MD;  Location: [facility] CATH LAB;  Service: Cardiovascular;  Laterality: N/A;   Family History  Problem Relation Age of Onset  . Breast cancer Mother   . Colon cancer Neg Hx   . Rectal cancer Neg Hx   . Stomach cancer Neg Hx   . Breast cancer Other     GREAT AUNT   History   Social History  . Marital Status: Divorced    Spouse Name: N/A  . Number of Children: 4  . Years of Education: N/A   Occupational History  . disable    Social History Main Topics  . Smoking status: Current Every Day Smoker -- 0.50 packs/day for 18 years    Types: Cigarettes  . Smokeless tobacco: Never Used     Comment: 3-6 per day  . Alcohol Use: Yes     Comment: rare  . Drug Use: No     Comment: hx of marijuana and cocaine use  . Sexual Activity: No   Other Topics Concern  . Not on file   Social History Narrative   Financial assistance application initiated. Patient needs to submit further paperwork to complete   [facility]  September 11, 2010 2:04 PM   Financial assistance approved for 100% discount at [facility] and has [facility] card   [name]  October 04, 2010 5:29 PM    Review of Systems: A comprehensive review of systems was negative except for: as noted above per HPI  Physical Exam: Blood pressure 134/82, pulse 79, temperature 98 F (36.7 C), temperature source Oral, resp. rate 20, height 5' 4.5" (1.638 m), weight 178 lb 5.6 oz (80.9 kg), last menstrual period 01/22/2015, SpO2 98 %.  Gen: A&O x 4, tearful lying in bed, well developed,  well nourished HEENT: Atraumatic, PERRL, EOMI, sclerae anicteric, moist mucous membranes Heart: Regular rate and rhythm, normal S1 S2, no murmurs, rubs, or gallops Lungs: Clear to auscultation bilaterally, respirations unlabored Abd: Soft, diffuse tenderness on palpation worse in lower abdomen, IR drain with small amount of serosanguinous output, non-distended, + bowel sounds, no hepatosplenomegaly Ext: L arm tender and swollen at site of previous IV, no edema or cyanosis  Lab results: Basic Metabolic Panel:  Recent Labs  02/09/15 0649  NA 138  K 3.6  CL 104  CO2 25  GLUCOSE 117*  BUN <5*  CREATININE 0.73  CALCIUM 8.8   CBC:  Recent Labs  02/09/15 0649  WBC 12.1*  HGB 12.2  HCT 37.5  MCV 98.4  PLT 241   Coagulation:  Recent Labs  02/09/15 0649  LABPROT 17.1*  INR 1.38   Imaging results:  No results found.  Other results: EKG: none  Assessment & Plan by Problem: Active Problems:   Diverticulitis of intestine with abscess  #Diverticulitis with Abscess: See HPI above. Alisha Hypolite has sigmoid diverticulitis and CT w oral contrast demonstrated adjacent 7.8 x 4.6 cm complex fluid collection consistent with diverticular abscess. She was placed on iv cipro and flagyl. She had successful placement of an IR drain with no complications the morning of 02/09/15 but left later that day AMA. Sample was sent for culture and gram stain which preliminarily show abundant WBC (predominantly PMN), abundant gram positive cocci and  gram negative rods but no anaerobes but no culture growth so far. BCx x 2 NGTD. She reports she took the po cipro and flagyl as prescribed for safety net on AMA. Alisha Algeo is currently hemodynamically stable. On exam, she has tender abdomen but not concerning for peritonitis. -gen surg re-consult -consider repeat CT abdomen pending surgery recs -iv metronidazole 500 mg iv q8h -iv cipro per pharmacy -dilaudid 0.5 mg iv q4hprn -CBC -CMP -BCx x 2  #L arm  phlebitis: It appears Alisha Bennetts has L arm phlebitis at site of previous iv on last admission. Given her history of DVT, will get doppler although she has been on warfarin but subtherapeutic. 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 She was subtherapeutic with INR 1.21 on previous presentation and started on a heparin bridge. The warfarin was held for her procedure and when she left AMA she said she took warfarin at home.  -heparin per pharmacy pending INR -warfarin per pharmacy -check INR   #CHF: Non-contributory. Echo 2007 45% EF per EMR. At home she is on ASA 81 mg daily, carvedilol 25 mg bid, lisinopril 20 mg bid, KDur 10 mEq. -cont ASA 81 mg daily, carvedilol 25 mg bid -hold lisinopril 20 mg bid, KDur 10 mEq pending BP reassessment and if she gets IV contrast for CT  #CAD: Non-contributory. 02/10/14 stress test low risk but anterior abn, Cath on 02/25/14 nonobstructive CAD, normal LV function 55-65%.  At home she is on ASA 81 mg daily, carvedilol 25 mg bid, lisinopril 20 mg bid, atorvastatin 40 mg daily. -cont ASA 81 mg daily, carvedilol 25 mg bid, atorvastatin 40 mg daily -hold for now lisinopril 20 mg bid  #HTN: BP currently 134/82, well controlled. At home she is on ASA 81 mg daily, carvedilol 25 mg bid, lisinopril 20 mg bid, HCTZ 12.5 mg daily, atorvastatin 40 mg daily. -cont ASA 81 mg daily, carvedilol 25 mg bid, atorvastatin 40 mg daily -hold lisinopril 20 mg bid, HCTZ 12.5 mg daily for now and reassess  #HLD: Last lipid profile 10/01/2011 w cholesterol 162, triglyceride 205, HDL 25, LDL 96. At home she is on atorvastatin 40 mg daily. -cont atorvastatin 40 mg daily.  #Diet: NPO pending general surgery assessment  #DVT PPx: heparin per pharmacy  #Code: Full   Dispo: Disposition is deferred at this time,  awaiting improvement of current medical problems. Anticipated discharge in approximately 2 day(s).   The patient does have a current PCP Julious Oka, MD) and does need an [facility] hospital follow-up appointment after discharge.  The patient does not know have transportation limitations that hinder transportation to clinic appointments.  Signed: Kelby Aline, MD 02/11/2015, 10:06 AM

## 2015-02-11 NOTE — Progress Notes (Signed)
ANTIBIOTIC CONSULT NOTE - INITIAL  Pharmacy Consult for Ciprofloxacin Indication: Diverticular abscess  Allergies  Allergen Reactions  . Propoxyphene N-Acetaminophen Swelling  . Strawberry     Electric shock     Patient Measurements: Height: 5' 4.5" (163.8 cm) Weight: 178 lb 5.6 oz (80.9 kg) (scale B) IBW/kg (Calculated) : 55.85  Vital Signs: Temp: 98 F (36.7 C) (02/13 0759) Temp Source: Oral (02/13 0759) BP: 134/82 mmHg (02/13 0759) Pulse Rate: 79 (02/13 0759) Intake/Output from previous day:   Intake/Output from this shift:    Labs:  Recent Labs  02/09/15 0649  WBC 12.1*  HGB 12.2  PLT 241  CREATININE 0.73   Estimated Creatinine Clearance: 85.6 mL/min (by C-G formula based on Cr of 0.73). No results for input(s): VANCOTROUGH, VANCOPEAK, VANCORANDOM, GENTTROUGH, GENTPEAK, GENTRANDOM, TOBRATROUGH, TOBRAPEAK, TOBRARND, AMIKACINPEAK, AMIKACINTROU, AMIKACIN in the last 72 hours.   Microbiology: Recent Results (from the past 720 hour(s))  Culture, Urine     Status: None   Collection Time: 02/06/15 11:57 AM  Result Value Ref Range Status   Specimen Description URINE, CLEAN CATCH  Final   Special Requests ADDED 2243  Final   Colony Count   Final    >=100,000 COLONIES/ML Performed at [facility]    Culture   Final    Multiple bacterial morphotypes present, none predominant. Suggest appropriate recollection if clinically indicated. Performed at [facility]    Report Status 02/08/2015 FINAL  Final  Culture, blood (routine x 2)     Status: None (Preliminary result)   Collection Time: 02/08/15  8:36 AM  Result Value Ref Range Status   Specimen Description BLOOD RIGHT HAND  Final   Special Requests BOTTLES DRAWN AEROBIC ONLY 5CC  Final   Culture   Final           BLOOD CULTURE RECEIVED NO GROWTH TO DATE CULTURE WILL BE HELD FOR 5 DAYS BEFORE ISSUING A FINAL NEGATIVE REPORT Performed at [facility]    Report Status PENDING  Incomplete   Culture, blood (routine x 2)     Status: None (Preliminary result)   Collection Time: 02/08/15  8:48 AM  Result Value Ref Range Status   Specimen Description BLOOD LEFT HAND  Final   Special Requests BOTTLES DRAWN AEROBIC ONLY 5CC  Final   Culture   Final           BLOOD CULTURE RECEIVED NO GROWTH TO DATE CULTURE WILL BE HELD FOR 5 DAYS BEFORE ISSUING A FINAL NEGATIVE REPORT Performed at [facility]    Report Status PENDING  Incomplete  Culture, routine-abscess     Status: None (Preliminary result)   Collection Time: 02/09/15 10:38 AM  Result Value Ref Range Status   Specimen Description ABSCESS ABDOMEN  Final   Special Requests DRAIN FROM LOWER ABD  Final   Gram Stain   Final    ABUNDANT WBC PRESENT, PREDOMINANTLY PMN NO SQUAMOUS EPITHELIAL CELLS SEEN ABUNDANT GRAM POSITIVE COCCI IN PAIRS ABUNDANT GRAM NEGATIVE RODS Performed at [facility]    Culture   Final    NO GROWTH 1 DAY Performed at [facility]    Report Status PENDING  Incomplete  Anaerobic culture     Status: None (Preliminary result)   Collection Time: 02/09/15 10:38 AM  Result Value Ref Range Status   Specimen Description ABSCESS ABDOMEN  Final   Special Requests DRAIN FROM LOWER ABD  Final   Gram Stain PENDING  Incomplete  Culture   Final    NO ANAEROBES ISOLATED; CULTURE IN PROGRESS FOR 5 DAYS Performed at [facility]    Report Status PENDING  Incomplete    Medical History: Past Medical History  Diagnosis Date  . DVT (deep venous thrombosis)     bilateral, 2 episodes: Requires lifelong therapy  . Chronic pain syndrome   . Restless leg syndrome   . Depression   . Hyperlipidemia   . Hypertension   . PVD (peripheral vascular disease)     s/p L fem-pop bypass 11/21/08; graft occluded 12/28/08;  aortogram w/ bilat LE runoff: 1.  bilat diffuse SFA occlusive dz, 2.  Mod to severe above-knee popliteal dz,  3.  Bilat 3-vessel runoff w/ mild tibial occlusive dz.  .  Tobacco abuse   . Degenerative joint disease of knee   . Breast lump 02/2008    Biopsy 05/2008: showed no evidence of malignancy  . Irregular menses 9/08  . Pulmonary edema   . Anxiety   . Joint pain   . Mixed stress and urge urinary incontinence     [facility], underwent cystoscopy & uroflowmetry   . Endometrial mass 10/12/2012    Endometrium, biopsy on 11/04/12 - PROLIFERATIVE ENDOMETRIUM AND ABUNDANT MUCUS. NO HYPERPLASIA OR CARCINOMA.   . Sigmoid diverticulitis 10/26/2012  . Asthma   . Breast mass in female     bi lat     Medications:  Prescriptions prior to admission  Medication Sig Dispense Refill Last Dose  . albuterol (PROVENTIL HFA;VENTOLIN HFA) 108 (90 BASE) MCG/ACT inhaler Inhale 1-2 puffs into the lungs every 4 (four) hours as needed for wheezing or shortness of breath. 3.7 g 2 Past Week at Unknown time  . aspirin EC 81 MG tablet Take 81 mg by mouth daily.   02/10/2015 at Unknown time  . atorvastatin (LIPITOR) 40 MG tablet Take 1 tablet (40 mg total) by mouth at bedtime. 90 tablet 3 02/10/2015 at Unknown time  . buPROPion (WELLBUTRIN XL) 300 MG 24 hr tablet Take 300 mg by mouth daily.   02/10/2015 at Unknown time  . carvedilol (COREG) 25 MG tablet Take 1 tablet (25 mg total) by mouth 2 (two) times daily with a meal. 180 tablet 3 02/10/2015 at Unknown time  . ciprofloxacin (CIPRO) 500 MG tablet Take 1 tablet (500 mg total) by mouth 2 (two) times daily. (Patient taking differently: Take 500 mg by mouth 2 (two) times daily. Started on 02-09-15) 22 tablet 0 02/10/2015 at Unknown time  . diazepam (VALIUM) 5 MG tablet Take 10 mg by mouth 3 (three) times daily.    02/10/2015 at Unknown time  . DULoxetine (CYMBALTA) 60 MG capsule Take 60 mg by mouth daily.    02/10/2015 at Unknown time  . gabapentin (NEURONTIN) 800 MG tablet Take 1 tablet in the morning, 1 tablet midday and 2 tablets at bedtime 120 tablet 0 02/10/2015 at Unknown time  . hydrochlorothiazide (HYDRODIURIL)  12.5 MG tablet Take 1 tablet (12.5 mg total) by mouth daily. 90 tablet 1 02/10/2015 at Unknown time  . hydrOXYzine (ATARAX/VISTARIL) 25 MG tablet Take 25 mg by mouth 3 (three) times daily as needed for anxiety.   02/10/2015 at Unknown time  . lisinopril (PRINIVIL,ZESTRIL) 20 MG tablet Take 1 tablet (20 mg total) by mouth 2 (two) times daily. 180 tablet 1 02/10/2015 at Unknown time  . metroNIDAZOLE (FLAGYL) 500 MG tablet Take 1 tablet (500 mg total) by mouth 3 (three) times daily. (Patient  taking differently: Take 500 mg by mouth 3 (three) times daily. Started on 02-09-15) 33 tablet 0 02/10/2015 at Unknown time  . Multiple Vitamins-Minerals (ONE-A-DAY 50 PLUS PO) Take 1 tablet by mouth daily.   02/10/2015 at Unknown time  . potassium chloride (K-DUR) 10 MEQ tablet TAKE 1 TABLET BY MOUTH EVERY DAY 30 tablet 10 02/10/2015 at Unknown time  . warfarin (COUMADIN) 7.5 MG tablet Take 3.75-7.5 mg by mouth daily. Take 3.75mg  alternating with 7.5mg  daily Mon wed and fridays take 3.75mg  rest of days take 7.5mg    02/10/2015 at Unknown time  . ziprasidone (GEODON) 60 MG capsule Take 60 mg by mouth 2 (two) times daily with a meal.    02/10/2015 at Unknown time   Assessment: 54 yo F admitted on 02/11/2015 after leaving AMA yesterday while being treated for diverticulitis complicated by abscess (JP drain in place since 2/11). She has been treated with Flagyl and cipro since 2/8 and was sent home with PO abx. She is now to be restarted on Cipro. Currently afebrile, WBC mildly elevated at 12.1, SCr wnl at 0.73 (CrCl ~85 ml/min).   Flagyl 2/8>> Cipro 2/8>>  2/13 BCx2>> 2/10 BCx2>>ngtd 2/11 abd abscess>>abundant GPC and GNR 2/8 UCx>>mult bact  Goal of Therapy:  Resolution of infection  Plan:  - Start Ciprofloxacin 400 mg IV q12h - Continue metronidazole 500 mg IV q8h per MD - Monitor renal function, temp, WBC, C&S  Treyshon Buchanon K. Velva Harman, PharmD Clinical Pharmacist - Resident Pager: [phone] Pharmacy:  [phone] 02/11/2015 9:43 AM

## 2015-02-11 NOTE — Progress Notes (Signed)
*  Preliminary Results* Left upper extremity venous duplex completed. Left upper extremity is negative for deep vein thrombosis, however there is evidence of superficial vein thrombosis involving the left cephalic vein in the forearm.  Preliminary results discussed with Tai, RN.  02/11/2015 11:45 AM  Maudry Mayhew, RVT, RDCS, RDMS

## 2015-02-11 NOTE — Progress Notes (Addendum)
ANTICOAGULATION CONSULT NOTE - Initial Consult  Pharmacy Consult for Heparin Indication: history of DVT (warfarin held for possible surgery)  Allergies  Allergen Reactions  . Propoxyphene N-Acetaminophen Swelling  . Strawberry     Electric shock     Patient Measurements: Height: 5' 4.5" (163.8 cm) Weight: 178 lb 5.6 oz (80.9 kg) (scale B) IBW/kg (Calculated) : 55.85 Heparin Dosing Weight: 73 kg  Vital Signs: Temp: 98.9 F (37.2 C) (02/13 1425) Temp Source: Oral (02/13 1425) BP: 129/91 mmHg (02/13 1425) Pulse Rate: 79 (02/13 1425)  Labs:  Recent Labs  02/08/15 1925 02/09/15 0649 02/11/15 1105  HGB  --  12.2 12.7  HCT  --  37.5 38.2  PLT  --  241 289  LABPROT  --  17.1* 21.1*  INR  --  1.38 1.80*  HEPARINUNFRC 0.51 0.23*  --   CREATININE  --  0.73 0.77    Estimated Creatinine Clearance: 85.6 mL/min (by C-G formula based on Cr of 0.77).   Medical History: Past Medical History  Diagnosis Date  . DVT (deep venous thrombosis)     bilateral, 2 episodes: Requires lifelong therapy  . Chronic pain syndrome   . Restless leg syndrome   . Depression   . Hyperlipidemia   . Hypertension   . PVD (peripheral vascular disease)     s/p L fem-pop bypass 11/21/08; graft occluded 12/28/08;  aortogram w/ bilat LE runoff: 1.  bilat diffuse SFA occlusive dz, 2.  Mod to severe above-knee popliteal dz,  3.  Bilat 3-vessel runoff w/ mild tibial occlusive dz.  . Tobacco abuse   . Degenerative joint disease of knee   . Breast lump 02/2008    Biopsy 05/2008: showed no evidence of malignancy  . Irregular menses 9/08  . Pulmonary edema   . Anxiety   . Joint pain   . Mixed stress and urge urinary incontinence     [facility], underwent cystoscopy & uroflowmetry   . Endometrial mass 10/12/2012    Endometrium, biopsy on 11/04/12 - PROLIFERATIVE ENDOMETRIUM AND ABUNDANT MUCUS. NO HYPERPLASIA OR CARCINOMA.   . Sigmoid diverticulitis 10/26/2012  . Asthma   . Breast  mass in female     bi lat    Assessment: 54 year old female on chronic coumadin for history of DVT now to change to IV heparin as patient may have surgery.  INR today is 1.8. Last Coumadin dose was 02/10/14. Now on Cipro and Flagyl which can affect the INR. CBC is within normal limits. Heparin sq was ordered but discontinued before any doses were given.  No bleeding reported.    Goal of Therapy:  Heparin level 0.3-0.7 units/ml Monitor platelets by anticoagulation protocol: Yes   Plan:  Heparin bolus of 2000 units x1 due to elevated INR.  Heparin drip at 1150 units/hr.  Heparin level in 6 hours Daily heparin level and CBC.   Sloan Leiter, PharmD, BCPS Clinical Pharmacist [phone] 02/11/2015,5:27 PM   Addendum: Initial heparin level is below goal at 0.17. Will increase rate to 1450 units/hr and check another heparin level in 6 hours.   Nena Jordan, PharmD, BCPS 02/12/2015, 2:08 AM

## 2015-02-11 NOTE — Progress Notes (Signed)
Patient ID: Alisha [name], female   DOB: 09-Apr-1961, 54 y.o.   MRN: [id number]     [facility]      [address] [place], [place] [address]    Phone: [phone] FAX: [phone]     Subjective: Returned due to worsening pain.  Reports she has been compliant with her antibiotics, only taking in clears and flushing her drain.  Objective:  Vital signs:  Filed Vitals:   02/11/15 0759  BP: 134/82  Pulse: 79  Temp: 98 F (36.7 C)  TempSrc: Oral  Resp: 20  Height: 5' 4.5" (1.638 m)  Weight: 178 lb 5.6 oz (80.9 kg)  SpO2: 98%       Intake/Output   Yesterday:    This shift:   Physical Exam: General: Pt awake/alert/oriented x4 in no acute distress Abdomen: Soft. Nondistended. Non tender. Drain with purulent output. No evidence of peritonitis. No incarcerated hernias.    Problem List:   Principal Problem:   Diverticulitis of intestine with abscess Active Problems:   Depression   Long term current use of anticoagulant   Anxiety   HTN (hypertension)   Colonic diverticular abscess   Superficial venous thrombosis of arm    Results:   Labs: Results for orders placed or performed during the hospital encounter of 02/11/15 (from the past 48 hour(s))  Comprehensive metabolic panel     Status: Abnormal   Collection Time: 02/11/15 11:05 AM  Result Value Ref Range   Sodium 140 135 - 145 mmol/L   Potassium 4.0 3.5 - 5.1 mmol/L   Chloride 102 96 - 112 mmol/L   CO2 29 19 - 32 mmol/L   Glucose, Bld 92 70 - 99 mg/dL   BUN <5 (L) 6 - 23 mg/dL   Creatinine, Ser 0.77 0.50 - 1.10 mg/dL   Calcium 8.8 8.4 - 10.5 mg/dL   Total Protein 6.2 6.0 - 8.3 g/dL   Albumin 2.8 (L) 3.5 - 5.2 g/dL   AST 17 0 - 37 U/L   ALT 11 0 - 35 U/L   Alkaline Phosphatase 54 39 - 117 U/L   Total Bilirubin 0.4 0.3 - 1.2 mg/dL   GFR calc non Af Amer >90 >90 mL/min   GFR calc Af Amer >90 >90 mL/min    Comment: (NOTE) The eGFR has been  calculated using the CKD EPI equation. This calculation has not been validated in all clinical situations. eGFR's persistently <90 mL/min signify possible Chronic Kidney Disease.    Anion gap 9 5 - 15  CBC     Status: None   Collection Time: 02/11/15 11:05 AM  Result Value Ref Range   WBC 10.5 4.0 - 10.5 K/uL   RBC 3.96 3.87 - 5.11 MIL/uL   Hemoglobin 12.7 12.0 - 15.0 g/dL   HCT 38.2 36.0 - 46.0 %   MCV 96.5 78.0 - 100.0 fL   MCH 32.1 26.0 - 34.0 pg   MCHC 33.2 30.0 - 36.0 g/dL   RDW 12.9 11.5 - 15.5 %   Platelets 289 150 - 400 K/uL  Protime-INR     Status: Abnormal   Collection Time: 02/11/15 11:05 AM  Result Value Ref Range   Prothrombin Time 21.1 (H) 11.6 - 15.2 seconds   INR 1.80 (H) 0.00 - 1.49    Imaging / Studies: No results found.  Medications / Allergies:  Scheduled Meds: . aspirin EC  81 mg Oral Daily  . atorvastatin  40 mg  Oral QHS  . buPROPion  300 mg Oral Daily  . carvedilol  25 mg Oral BID WC  . ciprofloxacin  400 mg Intravenous Q12H  . diazepam  10 mg Oral TID  . DULoxetine  60 mg Oral Daily  . gabapentin  1,600 mg Oral QHS  . gabapentin  800 mg Oral BID WC  . metronidazole  500 mg Intravenous Q8H  . ziprasidone  60 mg Oral BID WC   Continuous Infusions: . sodium chloride 125 mL/hr at 02/11/15 1059   PRN Meds:.albuterol, HYDROmorphone (DILAUDID) injection, hydrOXYzine  Antibiotics: Anti-infectives    Start     Dose/Rate Route Frequency Ordered Stop   02/11/15 1000  ciprofloxacin (CIPRO) IVPB 400 mg     400 mg 200 mL/hr over 60 Minutes Intravenous Every 12 hours 02/11/15 0937     02/11/15 0915  metroNIDAZOLE (FLAGYL) IVPB 500 mg     500 mg 100 mL/hr over 60 Minutes Intravenous Every 8 hours 02/11/15 0909          Assessment/Plan Medical non compliance Sigmoid diverticulitis with large abscess S/p IR perc drain placement -her exam is essentially unchanged, WBC is normal and she is afebrile.   -c/w cipro/flagyl -may have clears -will  repeat CT of abdomen and pelvis on Tuesday -would let IR know the patient has returned.   Erby Pian, [facility] Pager [phone] For consults and floor pages call [phone]  02/11/2015 1:11 PM

## 2015-02-12 LAB — CBC
HEMATOCRIT: 39 % (ref 36.0–46.0)
HEMOGLOBIN: 12.7 g/dL (ref 12.0–15.0)
MCH: 32.4 pg (ref 26.0–34.0)
MCHC: 32.6 g/dL (ref 30.0–36.0)
MCV: 99.5 fL (ref 78.0–100.0)
Platelets: 299 10*3/uL (ref 150–400)
RBC: 3.92 MIL/uL (ref 3.87–5.11)
RDW: 13.1 % (ref 11.5–15.5)
WBC: 11 10*3/uL — ABNORMAL HIGH (ref 4.0–10.5)

## 2015-02-12 LAB — BASIC METABOLIC PANEL
ANION GAP: 5 (ref 5–15)
CALCIUM: 8.5 mg/dL (ref 8.4–10.5)
CHLORIDE: 107 mmol/L (ref 96–112)
CO2: 27 mmol/L (ref 19–32)
Creatinine, Ser: 0.77 mg/dL (ref 0.50–1.10)
GFR calc Af Amer: >90 mL/min (ref >90)
GFR calc non Af Amer: >90 mL/min (ref >90)
GLUCOSE: 93 mg/dL (ref 70–99)
Potassium: 3.9 mmol/L (ref 3.5–5.1)
Sodium: 139 mmol/L (ref 135–145)

## 2015-02-12 LAB — CULTURE, ROUTINE-ABSCESS

## 2015-02-12 LAB — HEPARIN LEVEL (UNFRACTIONATED)
HEPARIN UNFRACTIONATED: 0.46 [IU]/mL (ref 0.30–0.70)
HEPARIN UNFRACTIONATED: 0.17 [IU]/mL — AB (ref 0.30–0.70)
Heparin Unfractionated: 0.41 IU/mL (ref 0.30–0.70)

## 2015-02-12 MED ORDER — HEPARIN (PORCINE) IN NACL 100-0.45 UNIT/ML-% IJ SOLN
1500.0000 [IU]/h | INTRAMUSCULAR | Status: DC
  Administered 2015-02-12 – 2015-02-13 (×4): 1450 [IU]/h via INTRAVENOUS
  Administered 2015-02-14: 1600 [IU]/h via INTRAVENOUS
  Administered 2015-02-15: 1500 [IU]/h via INTRAVENOUS
  Filled 2015-02-12 (×7): qty 250

## 2015-02-12 NOTE — Progress Notes (Signed)
  Date: 02/12/2015  Patient name: Alisha [name]  Medical record number: [id number]  Date of birth: 1961/07/04   This patient has been seen and the plan of care was discussed with the house staff. Please see their note for complete details. I concur with their findings with the following additions/corrections: Ms Popovich looks much perkier today. She is tolerating clears and surgery has advanced her diet. Her LUE superficial phlebitis is improving. For repeat ABD CT on the 16th.  Bartholomew Crews, MD 02/12/2015, 11:01 AM

## 2015-02-12 NOTE — Progress Notes (Signed)
Patient was extremely drowsy and could barely keep her eyes open. Valium was withheld because husband at the bedside said she had brought medications from home and her lethargy implied that she may have been taking them in addition to her scheduled dose. Reinforcement made with her husband that these medications needed to be taken home for the remainder of her stay.  Leonard Downing, Student Nurse [employer].

## 2015-02-12 NOTE — Progress Notes (Signed)
Subjective: Alisha [name]. Alisha [name] says that her abdominal pain is slightly improved but still has some left sided pain. She was hungry and said she had been tolerating liquids well. She thinks her L arm has improved. She has been seen by general surgery who recommend repeat CT on 2/16. I called IR yesterday to update them on Alisha [name]'s leaving [facility] and subsequent return and they said a provider will see her.  Objective: Vital signs in last 24 hours: Filed Vitals:   02/11/15 1425 02/11/15 2023 02/12/15 0056 02/12/15 0429  BP: 129/91 118/70 135/83 149/88  Pulse: 79 76 74 77  Temp: 98.9 F (37.2 C) 97.6 F (36.4 C) 98.5 F (36.9 C) 97.7 F (36.5 C)  TempSrc: Oral Oral Oral Oral  Resp: 20 18 18 18   Height:      Weight:    183 lb 6.4 oz (83.19 kg)  SpO2: 98% 98% 97% 98%   Weight change:   Intake/Output Summary (Last 24 hours) at 02/12/15 1121 Last data filed at 02/12/15 0842  Gross per 24 hour  Intake 3914.85 ml  Output   1085 ml  Net 2829.85 ml   Gen: A&O x 4, no acute distress, well developed, well nourished HEENT: Atraumatic, PERRL, EOMI, sclerae anicteric, moist mucous membranes Heart: Regular rate and rhythm, normal S1 S2, no murmurs, rubs, or gallops Lungs: Clear to auscultation bilaterally, respirations unlabored Abd: Soft, diffuse tenderness on palpation worse in lower abdomen, IR drain with minimal amount of serosanguinous output, non-distended, + bowel sounds, no hepatosplenomegaly Ext: L arm less tender and swollen at site of previous IV, no edema or cyanosis  Lab Results: Basic Metabolic Panel:  Recent Labs Lab 02/11/15 1105 02/12/15 0050  NA 140 139  K 4.0 3.9  CL 102 107  CO2 29 27  GLUCOSE 92 93  BUN <5* <5*  CREATININE 0.77 0.77  CALCIUM 8.8 8.5   Liver Function Tests:  Recent Labs Lab 02/06/15 1026 02/11/15 1105  AST 20 17  ALT 16 11  ALKPHOS 72 54  BILITOT 1.5* 0.4  PROT 7.2 6.2  ALBUMIN 3.1* 2.8*    Recent Labs Lab 02/06/15 1026  LIPASE 21    CBC:  Recent Labs Lab 02/06/15 1026  02/11/15 1105 02/12/15 0050  WBC 16.6*  < > 10.5 11.0*  NEUTROABS 13.1*  --   --   --   HGB 14.4  < > 12.7 12.7  HCT 41.3  < > 38.2 39.0  MCV 94.5  < > 96.5 99.5  PLT 212  < > 289 299  < > = values in this interval not displayed. Coagulation:  Recent Labs Lab 02/07/15 0336 02/08/15 0821 02/09/15 0649 02/11/15 1105  LABPROT 15.4* 18.1* 17.1* 21.1*  INR 1.21 1.48 1.38 1.80*   Urinalysis:  Recent Labs Lab 02/06/15 1159  COLORURINE AMBER*  LABSPEC 1.040*  PHURINE 6.0  GLUCOSEU NEGATIVE  HGBUR NEGATIVE  BILIRUBINUR LARGE*  KETONESUR 40*  PROTEINUR >300*  UROBILINOGEN 1.0  NITRITE NEGATIVE  LEUKOCYTESUR SMALL*   Micro Results: Recent Results (from the past 240 hour(s))  Culture, Urine     Status: None   Collection Time: 02/06/15 11:57 AM  Result Value Ref Range Status   Specimen Description URINE, CLEAN CATCH  Final   Special Requests ADDED 2243  Final   Colony Count   Final    >=100,000 COLONIES/ML Performed at [facility]    Culture   Final    Multiple bacterial morphotypes present, none  predominant. Suggest appropriate recollection if clinically indicated. Performed at [facility]    Report Status 02/08/2015 FINAL  Final  Culture, blood (routine x 2)     Status: None (Preliminary result)   Collection Time: 02/08/15  8:36 AM  Result Value Ref Range Status   Specimen Description BLOOD RIGHT HAND  Final   Special Requests BOTTLES DRAWN AEROBIC ONLY 5CC  Final   Culture   Final           BLOOD CULTURE RECEIVED NO GROWTH TO DATE CULTURE WILL BE HELD FOR 5 DAYS BEFORE ISSUING A FINAL NEGATIVE REPORT Performed at [facility]    Report Status PENDING  Incomplete  Culture, blood (routine x 2)     Status: None (Preliminary result)   Collection Time: 02/08/15  8:48 AM  Result Value Ref Range Status   Specimen Description BLOOD LEFT HAND  Final   Special Requests BOTTLES DRAWN AEROBIC ONLY 5CC   Final   Culture   Final           BLOOD CULTURE RECEIVED NO GROWTH TO DATE CULTURE WILL BE HELD FOR 5 DAYS BEFORE ISSUING A FINAL NEGATIVE REPORT Performed at [facility]    Report Status PENDING  Incomplete  Culture, routine-abscess     Status: None (Preliminary result)   Collection Time: 02/09/15 10:38 AM  Result Value Ref Range Status   Specimen Description ABSCESS ABDOMEN  Final   Special Requests DRAIN FROM LOWER ABD  Final   Gram Stain   Final    ABUNDANT WBC PRESENT, PREDOMINANTLY PMN NO SQUAMOUS EPITHELIAL CELLS SEEN ABUNDANT GRAM POSITIVE COCCI IN PAIRS ABUNDANT GRAM NEGATIVE RODS Performed at [facility]    Culture   Final    NO GROWTH 1 DAY Performed at [facility]    Report Status PENDING  Incomplete  Anaerobic culture     Status: None (Preliminary result)   Collection Time: 02/09/15 10:38 AM  Result Value Ref Range Status   Specimen Description ABSCESS ABDOMEN  Final   Special Requests DRAIN FROM LOWER ABD  Final   Gram Stain PENDING  Incomplete   Culture   Final    NO ANAEROBES ISOLATED; CULTURE IN PROGRESS FOR 5 DAYS Performed at [facility]    Report Status PENDING  Incomplete   Left upper extremity doppler: findings consistent with acute superficial vein thrombosis involving the left cephalic vein in the forearm only.  Studies/Results: No results found. Medications: I have reviewed the patient's current medications. Scheduled Meds: . aspirin EC  81 mg Oral Daily  . atorvastatin  40 mg Oral QHS  . buPROPion  300 mg Oral Daily  . carvedilol  25 mg Oral BID WC  . ciprofloxacin  500 mg Oral BID  . diazepam  10 mg Oral TID  . DULoxetine  60 mg Oral Daily  . gabapentin  1,600 mg Oral QHS  . gabapentin  800 mg Oral BID WC  . metronidazole  500 mg Intravenous Q8H  . ziprasidone  60 mg Oral BID WC   Continuous Infusions: . sodium chloride 125 mL/hr at 02/12/15 0753  . heparin 1,450 Units/hr (02/12/15 1031)   PRN  Meds:.albuterol, HYDROmorphone (DILAUDID) injection, hydrOXYzine Assessment/Plan: Principal Problem:   Diverticulitis of intestine with abscess Active Problems:   Depression   Long term current use of anticoagulant   Anxiety   HTN (hypertension)   Colonic diverticular abscess   Superficial venous thrombosis of arm  #Diverticulitis  with Abscess: Alisha Busser symptoms are improving. She has sigmoid diverticulitis and CT w oral contrast demonstrated adjacent 7.8 x 4.6 cm complex fluid collection consistent with diverticular abscess. She had successful placement of an IR drain with no complications the morning of 02/09/15 but left later that day AMA. IR has been notified that she returned and will come assess. Abscess sample preliminarily shows abundant WBC (predominantly PMN), abundant gram positive cocci and gram negative rods but no anaerobes but no culture growth so far. BCx x 2 NGTD. She took po cipro flagyl while out of hospital and has been restarted on iv cipro flagyl. General surgery recommends repeat CT 2/16 -appreciate gen surg, IR -repeat CT abdomen 2/16 -iv metronidazole 500 mg iv q8h -iv cipro per pharmacy -dilaudid 0.5 mg iv q4hprn -f/u cultures -full liquid diet  #L arm phlebitis: Alisha Hoare's L arm acute superficial vein thrombosis of L cephalic vein noted on doppler at site of previous iv on last admission has improved with compression and elevation.  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 She was subtherapeutic with INR 1.21 on previous presentation and started on a heparin bridge. Her INR on return this admission was 1.80. We are currently holding her warfarin until surgery decides following 2/16 CT whether she needs another procedure. -heparin per pharmacy pending INR  #CHF: Non-contributory. Echo 2007 45% EF per EMR. At home she is on ASA 81 mg daily, carvedilol 25 mg bid, lisinopril 20 mg bid, KDur  10 mEq. -cont ASA 81 mg daily, carvedilol 25 mg bid -hold lisinopril 20 mg bid, KDur 10 mEq pending BP reassessment and if she gets IV contrast for CT  #CAD: Non-contributory. 02/10/14 stress test low risk but anterior abn, Cath on 02/25/14 nonobstructive CAD, normal LV function 55-65%.  At home she is on ASA 81 mg daily, carvedilol 25 mg bid, lisinopril 20 mg bid, atorvastatin 40 mg daily. -cont ASA 81 mg daily, carvedilol 25 mg bid, atorvastatin 40 mg daily -hold for now lisinopril 20 mg bid  #HTN: BP 10s-140s/70s-80s overnight. At home she is on ASA 81 mg daily, carvedilol 25 mg bid, lisinopril 20 mg bid, HCTZ 12.5 mg daily, atorvastatin 40 mg daily. -cont ASA 81 mg daily, carvedilol 25 mg bid, atorvastatin 40 mg daily -hold lisinopril 20 mg bid, HCTZ 12.5 mg daily for now and reassess  #HLD: Last lipid profile 10/01/2011 w cholesterol 162, triglyceride 205, HDL 25, LDL 96. At home she is on atorvastatin 40 mg daily. -cont atorvastatin 40 mg daily.  #Diet: full liquid  #DVT PPx: heparin per pharmacy  #Code: Full  Dispo: Disposition is deferred at this time, awaiting improvement of current medical problems.  Anticipated discharge in approximately 3 day(s).   The patient does have a current PCP Julious Oka, MD) and does need an [facility] hospital follow-up appointment after discharge.  The patient does not know have transportation limitations that hinder transportation to clinic appointments.  .Services Needed at time of discharge: Y = Yes, Blank = No PT:   OT:   RN:   Equipment:   Other:     LOS: 1 day   Kelby Aline, MD 02/12/2015, 11:21 AM

## 2015-02-12 NOTE — Progress Notes (Signed)
ANTICOAGULATION CONSULT NOTE - Initial Consult  Pharmacy Consult for Heparin Indication: history of DVT (warfarin held for possible surgery)  Allergies  Allergen Reactions  . Propoxyphene N-Acetaminophen Swelling  . Strawberry     Electric shock     Patient Measurements: Height: 5' 4.5" (163.8 cm) Weight: 183 lb 6.4 oz (83.19 kg) (scale b) IBW/kg (Calculated) : 55.85 Heparin Dosing Weight: 73 kg  Vital Signs: Temp: 97.7 F (36.5 C) (02/14 0429) Temp Source: Oral (02/14 0429) BP: 149/88 mmHg (02/14 0429) Pulse Rate: 77 (02/14 0429)  Labs:  Recent Labs  02/11/15 1105 02/12/15 0050 02/12/15 0905  HGB 12.7 12.7  --   HCT 38.2 39.0  --   PLT 289 299  --   LABPROT 21.1*  --   --   INR 1.80*  --   --   HEPARINUNFRC  --  0.17* 0.41  CREATININE 0.77 0.77  --     Estimated Creatinine Clearance: 86.7 mL/min (by C-G formula based on Cr of 0.77).   Medical History: Past Medical History  Diagnosis Date  . DVT (deep venous thrombosis)     bilateral, 2 episodes: Requires lifelong therapy  . Chronic pain syndrome   . Restless leg syndrome   . Depression   . Hyperlipidemia   . Hypertension   . PVD (peripheral vascular disease)     s/p L fem-pop bypass 11/21/08; graft occluded 12/28/08;  aortogram w/ bilat LE runoff: 1.  bilat diffuse SFA occlusive dz, 2.  Mod to severe above-knee popliteal dz,  3.  Bilat 3-vessel runoff w/ mild tibial occlusive dz.  . Tobacco abuse   . Degenerative joint disease of knee   . Breast lump 02/2008    Biopsy 05/2008: showed no evidence of malignancy  . Irregular menses 9/08  . Pulmonary edema   . Anxiety   . Joint pain   . Mixed stress and urge urinary incontinence     Being followed by [facility], underwent cystoscopy & uroflowmetry   . Endometrial mass 10/12/2012    Endometrium, biopsy on 11/04/12 - PROLIFERATIVE ENDOMETRIUM AND ABUNDANT MUCUS. NO HYPERPLASIA OR CARCINOMA.   . Sigmoid diverticulitis 10/26/2012  . Asthma   . Breast  mass in female     bi lat    Assessment: 54 year old F admitted on 02/11/2015 for continued treatment of diverticulitis complicated by abscess (JP drain in place). She is on chronic coumadin for history of DVT now to change to IV heparin as patient may have surgery.   INR on admission was SUBtherapeutic at 1.8. Now on Cipro and Flagyl which can affect the INR.   HL is now therapeutic at 0.41 after rate increase this am. CBC remains wnl and stable with no overt s/s bleeding.   Home Coumadin dose: 7.5 mg daily except 11.25 mg MWF (based on coumadin clinic visit on 2/1 and verified with patient; last dose on 2/12)   Goal of Therapy:  Heparin level 0.3-0.7 units/ml Monitor platelets by anticoagulation protocol: Yes   Plan:  - Continue heparin gtt at 1450 u/hr - Confirmatory HL in 6 hours - Daily HL/CBC and monitoring for s/s bleeding - Will f/u restart warfarin after CT on Tuesday   Johnelle Tafolla K. Velva Harman, PharmD Clinical Pharmacist - Resident Pager: [phone] Pharmacy: [phone] 02/12/2015 11:25 AM

## 2015-02-12 NOTE — Progress Notes (Signed)
Patient ID: Alisha [name], female   DOB: [birth year], 54 y.o.   MRN: [id number]     [facility]      [address] [place], [place] [address]    Phone: [phone] FAX: [phone]     Subjective: Wants to leave the hospital, again.  Wants to eat, settled on having coffee and continuing with clears.  Afebrile.  VSS.  Slight increase in wbc but overall down.  Objective:  Vital signs:  Filed Vitals:   02/11/15 1425 02/11/15 2023 02/12/15 0056 02/12/15 0429  BP: 129/91 118/70 135/83 149/88  Pulse: 79 76 74 77  Temp: 98.9 F (37.2 C) 97.6 F (36.4 C) 98.5 F (36.9 C) 97.7 F (36.5 C)  TempSrc: Oral Oral Oral Oral  Resp: '20 18 18 18  ' Height:      Weight:    183 lb 6.4 oz (83.19 kg)  SpO2: 98% 98% 97% 98%    Last BM Date: 02/10/15  Intake/Output   Yesterday:  02/13 0701 - 02/14 0700 In: 3534.9 [P.O.:730; I.V.:2504.9; IV Piggyback:300] Out: 1085 [Urine:1050; Drains:35] This shift:    I/O last 3 completed shifts: In: 3534.9 [P.O.:730; I.V.:2504.9; IV Piggyback:300] Out: 2831 [Urine:1050; Drains:35]    Physical Exam: General: Pt awake/alert/oriented x4 in no acute distress Abdomen: Soft. Nondistended. Non tender. Drain with purulent output. No evidence of peritonitis. No incarcerated hernias.   Problem List:   Principal Problem:   Diverticulitis of intestine with abscess Active Problems:   Depression   Long term current use of anticoagulant   Anxiety   HTN (hypertension)   Colonic diverticular abscess   Superficial venous thrombosis of arm    Results:   Labs: Results for orders placed or performed during the hospital encounter of 02/11/15 (from the past 48 hour(s))  Comprehensive metabolic panel     Status: Abnormal   Collection Time: 02/11/15 11:05 AM  Result Value Ref Range   Sodium 140 135 - 145 mmol/L   Potassium 4.0 3.5 - 5.1 mmol/L   Chloride 102 96 - 112 mmol/L   CO2 29 19 - 32 mmol/L   Glucose, Bld 92 70 - 99 mg/dL   BUN <5 (L) 6 - 23 mg/dL   Creatinine, Ser 0.77 0.50 - 1.10 mg/dL   Calcium 8.8 8.4 - 10.5 mg/dL   Total Protein 6.2 6.0 - 8.3 g/dL   Albumin 2.8 (L) 3.5 - 5.2 g/dL   AST 17 0 - 37 U/L   ALT 11 0 - 35 U/L   Alkaline Phosphatase 54 39 - 117 U/L   Total Bilirubin 0.4 0.3 - 1.2 mg/dL   GFR calc non Af Amer >90 >90 mL/min   GFR calc Af Amer >90 >90 mL/min    Comment: (NOTE) The eGFR has been calculated using the CKD EPI equation. This calculation has not been validated in all clinical situations. eGFR's persistently <90 mL/min signify possible Chronic Kidney Disease.    Anion gap 9 5 - 15  CBC     Status: None   Collection Time: 02/11/15 11:05 AM  Result Value Ref Range   WBC 10.5 4.0 - 10.5 K/uL   RBC 3.96 3.87 - 5.11 MIL/uL   Hemoglobin 12.7 12.0 - 15.0 g/dL   HCT 38.2 36.0 - 46.0 %   MCV 96.5 78.0 - 100.0 fL   MCH 32.1 26.0 - 34.0 pg   MCHC 33.2 30.0 - 36.0 g/dL   RDW 12.9 11.5 - 15.5 %  Platelets 289 150 - 400 K/uL  Protime-INR     Status: Abnormal   Collection Time: 02/11/15 11:05 AM  Result Value Ref Range   Prothrombin Time 21.1 (H) 11.6 - 15.2 seconds   INR 1.80 (H) 0.00 - 5.40  Basic metabolic panel     Status: Abnormal   Collection Time: 02/12/15 12:50 AM  Result Value Ref Range   Sodium 139 135 - 145 mmol/L   Potassium 3.9 3.5 - 5.1 mmol/L   Chloride 107 96 - 112 mmol/L   CO2 27 19 - 32 mmol/L   Glucose, Bld 93 70 - 99 mg/dL   BUN <5 (L) 6 - 23 mg/dL   Creatinine, Ser 0.77 0.50 - 1.10 mg/dL   Calcium 8.5 8.4 - 10.5 mg/dL   GFR calc non Af Amer >90 >90 mL/min   GFR calc Af Amer >90 >90 mL/min    Comment: (NOTE) The eGFR has been calculated using the CKD EPI equation. This calculation has not been validated in all clinical situations. eGFR's persistently <90 mL/min signify possible Chronic Kidney Disease.    Anion gap 5 5 - 15  CBC     Status: Abnormal   Collection Time: 02/12/15 12:50 AM  Result Value Ref Range   WBC  11.0 (H) 4.0 - 10.5 K/uL   RBC 3.92 3.87 - 5.11 MIL/uL   Hemoglobin 12.7 12.0 - 15.0 g/dL   HCT 39.0 36.0 - 46.0 %   MCV 99.5 78.0 - 100.0 fL   MCH 32.4 26.0 - 34.0 pg   MCHC 32.6 30.0 - 36.0 g/dL   RDW 13.1 11.5 - 15.5 %   Platelets 299 150 - 400 K/uL  Heparin level (unfractionated)     Status: Abnormal   Collection Time: 02/12/15 12:50 AM  Result Value Ref Range   Heparin Unfractionated 0.17 (L) 0.30 - 0.70 IU/mL    Comment:        IF HEPARIN RESULTS ARE BELOW EXPECTED VALUES, AND PATIENT DOSAGE HAS BEEN CONFIRMED, SUGGEST FOLLOW UP TESTING OF ANTITHROMBIN III LEVELS.     Imaging / Studies: No results found.  Medications / Allergies:  Scheduled Meds: . aspirin EC  81 mg Oral Daily  . atorvastatin  40 mg Oral QHS  . buPROPion  300 mg Oral Daily  . carvedilol  25 mg Oral BID WC  . ciprofloxacin  500 mg Oral BID  . diazepam  10 mg Oral TID  . DULoxetine  60 mg Oral Daily  . gabapentin  1,600 mg Oral QHS  . gabapentin  800 mg Oral BID WC  . metronidazole  500 mg Intravenous Q8H  . ziprasidone  60 mg Oral BID WC   Continuous Infusions: . sodium chloride 125 mL/hr at 02/11/15 2214  . heparin 1,450 Units/hr (02/12/15 0332)   PRN Meds:.albuterol, HYDROmorphone (DILAUDID) injection, hydrOXYzine  Antibiotics: Anti-infectives    Start     Dose/Rate Route Frequency Ordered Stop   02/11/15 2215  ciprofloxacin (CIPRO) tablet 500 mg     500 mg Oral 2 times daily 02/11/15 2207     02/11/15 1000  ciprofloxacin (CIPRO) IVPB 400 mg  Status:  Discontinued     400 mg 200 mL/hr over 60 Minutes Intravenous Every 12 hours 02/11/15 0937 02/11/15 2207   02/11/15 0915  metroNIDAZOLE (FLAGYL) IVPB 500 mg     500 mg 100 mL/hr over 60 Minutes Intravenous Every 8 hours 02/11/15 0867          Assessment/Plan Medical non  compliance Sigmoid diverticulitis with large abscess S/p IR perc drain placement.  -c/w cipro/flagyl -may have clears -will repeat CT of abdomen and pelvis, may  need to be scanned tomorrow with WBC trending up(last CT 2/9). -would let IR know the patient has returned.   Erby Pian, ANP-BC [facility]  02/12/2015 7:19 AM

## 2015-02-12 NOTE — Progress Notes (Signed)
  Subjective: Complains of pain, but looks comfortable and wants to eat  Objective: Vital signs in last 24 hours: Temp:  [97.6 F (36.4 C)-98.9 F (37.2 C)] 97.7 F (36.5 C) (02/14 0429) Pulse Rate:  [74-79] 77 (02/14 0429) Resp:  [18-20] 18 (02/14 0429) BP: (118-149)/(70-91) 149/88 mmHg (02/14 0429) SpO2:  [97 %-98 %] 98 % (02/14 0429) Weight:  [178 lb 5.6 oz (80.9 kg)-183 lb 6.4 oz (83.19 kg)] 183 lb 6.4 oz (83.19 kg) (02/14 0429) Last BM Date: 02/10/15  Intake/Output from previous day: 02/13 0701 - 02/14 0700 In: 3534.9 [P.O.:730; I.V.:2504.9; IV Piggyback:300] Out: 1085 [Urine:1050; Drains:35] Intake/Output this shift: Total I/O In: 240 [P.O.:240] Out: -   Abdomen soft, drain purulent  Lab Results:   Recent Labs  02/11/15 1105 02/12/15 0050  WBC 10.5 11.0*  HGB 12.7 12.7  HCT 38.2 39.0  PLT 289 299   BMET  Recent Labs  02/11/15 1105 02/12/15 0050  NA 140 139  K 4.0 3.9  CL 102 107  CO2 29 27  GLUCOSE 92 93  BUN <5* <5*  CREATININE 0.77 0.77  CALCIUM 8.8 8.5   PT/INR  Recent Labs  02/11/15 1105  LABPROT 21.1*  INR 1.80*   ABG No results for input(s): PHART, HCO3 in the last 72 hours.  Invalid input(s): PCO2, PO2  Studies/Results: No results found.  Anti-infectives: Anti-infectives    Start     Dose/Rate Route Frequency Ordered Stop   02/11/15 2215  ciprofloxacin (CIPRO) tablet 500 mg     500 mg Oral 2 times daily 02/11/15 2207     02/11/15 1000  ciprofloxacin (CIPRO) IVPB 400 mg  Status:  Discontinued     400 mg 200 mL/hr over 60 Minutes Intravenous Every 12 hours 02/11/15 0937 02/11/15 2207   02/11/15 0915  metroNIDAZOLE (FLAGYL) IVPB 500 mg     500 mg 100 mL/hr over 60 Minutes Intravenous Every 8 hours 02/11/15 0909        Assessment/Plan:  Diverticular abscess s/p drain by IR. Needs repeat CT Tuesday Ok to advance diet  LOS: 1 day    Mccartney Chuba A 02/12/2015

## 2015-02-12 NOTE — Progress Notes (Signed)
This am the student nurse and the instructor went to give pt. 1000 am meds, patient was very sleepy and lethargic, the student did not give valium, patient said she will take her own valium, instruct the patient she can not take her own, also  Instruct the patient's  husband to take the medication back home, husband took meds home., search pt. [place] nothing find. Will continue to monitor patient.

## 2015-02-12 NOTE — Progress Notes (Signed)
[place] for Heparin Indication: history of DVT (warfarin held for possible surgery)  Allergies  Allergen Reactions  . Propoxyphene N-Acetaminophen Swelling  . Strawberry     Electric shock     Patient Measurements: Height: 5' 4.5" (163.8 cm) Weight: 183 lb 6.4 oz (83.19 kg) (scale b) IBW/kg (Calculated) : 55.85 Heparin Dosing Weight: 73 kg  Vital Signs: Temp: 99.6 F (37.6 C) (02/14 1440) Temp Source: Oral (02/14 1440) BP: 137/69 mmHg (02/14 1440) Pulse Rate: 80 (02/14 1440)  Labs:  Recent Labs  02/11/15 1105 02/12/15 0050 02/12/15 0905 02/12/15 1510  HGB 12.7 12.7  --   --   HCT 38.2 39.0  --   --   PLT 289 299  --   --   LABPROT 21.1*  --   --   --   INR 1.80*  --   --   --   HEPARINUNFRC  --  0.17* 0.41 0.46  CREATININE 0.77 0.77  --   --     Estimated Creatinine Clearance: 86.7 mL/min (by C-G formula based on Cr of 0.77).   Medical History: Past Medical History  Diagnosis Date  . DVT (deep venous thrombosis)     bilateral, 2 episodes: Requires lifelong therapy  . Chronic pain syndrome   . Restless leg syndrome   . Depression   . Hyperlipidemia   . Hypertension   . PVD (peripheral vascular disease)     s/p L fem-pop bypass 11/21/08; graft occluded 12/28/08;  aortogram w/ bilat LE runoff: 1.  bilat diffuse SFA occlusive dz, 2.  Mod to severe above-knee popliteal dz,  3.  Bilat 3-vessel runoff w/ mild tibial occlusive dz.  . Tobacco abuse   . Degenerative joint disease of knee   . Breast lump 02/2008    Biopsy 05/2008: showed no evidence of malignancy  . Irregular menses 9/08  . Pulmonary edema   . Anxiety   . Joint pain   . Mixed stress and urge urinary incontinence     Being followed by [facility], underwent cystoscopy & uroflowmetry   . Endometrial mass 10/12/2012    Endometrium, biopsy on 11/04/12 - PROLIFERATIVE ENDOMETRIUM AND ABUNDANT MUCUS. NO HYPERPLASIA OR CARCINOMA.   . Sigmoid diverticulitis  10/26/2012  . Asthma   . Breast mass in female     bi lat    Assessment: 54 year old F admitted on 02/11/2015 for continued treatment of diverticulitis complicated by abscess (JP drain in place). She is on chronic coumadin for history of DVT now to change to IV heparin as patient may have surgery.   INR on admission was SUBtherapeutic at 1.8. Now on Cipro and Flagyl which can affect the INR.   Heparin level therapeutic x 2  Home Coumadin dose: 7.5 mg daily except 11.25 mg MWF (based on coumadin clinic visit on 2/1 and verified with patient; last dose on 2/12)   Goal of Therapy:  Heparin level 0.3-0.7 units/ml Monitor platelets by anticoagulation protocol: Yes   Plan:  - Continue heparin gtt at 1450 u/hr - Daily HL/CBC and monitoring for s/s bleeding - Will f/u restart warfarin after CT on Tuesday   Thank you. Anette Guarneri, PharmD [phone]  02/12/2015 4:37 PM

## 2015-02-12 NOTE — Clinical Social Work Psychosocial (Signed)
Clinical Social Work Department BRIEF PSYCHOSOCIAL ASSESSMENT 02/12/2015  Patient:  Alisha [name], Alisha [name]     Account Number:  [id number]     Admit date:  02/11/2015  Clinical Social Worker:  Oretha Ellis, CLINICAL SOCIAL WORKER  Date/Time:  02/12/2015 12:00 M  Referred by:  Physician  Date Referred:  02/12/2015 Referred for  Homelessness   Other Referral:   Interview type:  Patient Other interview type:    PSYCHOSOCIAL DATA Living Status:  ALONE Admitted from facility:   Level of care:   Primary support name:   Primary support relationship to patient:   Degree of support available:    CURRENT CONCERNS Current Concerns  Other - See comment   Other Concerns:   Employment    SOCIAL WORK ASSESSMENT / PLAN CSW intern spoke with patient about wanting to enter an employment program. Patient stated she receives disability, but needs additional funds and would like to be in a job placement program. CSW Intern provided information for the "Ticket to Work" program, vocational rehab, [employer], and [employer]. Patient was very appreciative of support provided and did not have nay other needs.   Assessment/plan status:  Psychosocial Support/Ongoing Assessment of Needs Other assessment/ plan:   Information/referral to community resources:    PATIENT'S/FAMILY'S RESPONSE TO PLAN OF CARE: Patient alert and oriented x3 sitting in bed. No family or friends currently at bedside. Patient engaged in [place] Intern assessment. Patient understanidning of Social work role and Patent attorney of support.

## 2015-02-12 NOTE — Progress Notes (Signed)
  Subjective:  Pt drowsy but arousable; has mild LLQ discomfort  Objective: Vital signs in last 24 hours: Temp:  [97.6 F (36.4 C)-98.9 F (37.2 C)] 97.7 F (36.5 C) (02/14 0429) Pulse Rate:  [74-79] 77 (02/14 0429) Resp:  [18-20] 18 (02/14 0429) BP: (118-149)/(70-91) 149/88 mmHg (02/14 0429) SpO2:  [97 %-98 %] 98 % (02/14 0429) Weight:  [183 lb 6.4 oz (83.19 kg)] 183 lb 6.4 oz (83.19 kg) (02/14 0429) Last BM Date: 02/10/15  Intake/Output from previous day: 02/13 0701 - 02/14 0700 In: 3534.9 [P.O.:730; I.V.:2504.9; IV Piggyback:300] Out: 1085 [Urine:1050; Drains:35] Intake/Output this shift: Total I/O In: 600 [P.O.:600] Out: -   LLQ drain intact, output 35 cc's ; insertion site mildly tender; cx's- pend  Lab Results:   Recent Labs  02/11/15 1105 02/12/15 0050  WBC 10.5 11.0*  HGB 12.7 12.7  HCT 38.2 39.0  PLT 289 299   BMET  Recent Labs  02/11/15 1105 02/12/15 0050  NA 140 139  K 4.0 3.9  CL 102 107  CO2 29 27  GLUCOSE 92 93  BUN <5* <5*  CREATININE 0.77 0.77  CALCIUM 8.8 8.5   PT/INR  Recent Labs  02/11/15 1105  LABPROT 21.1*  INR 1.80*   ABG No results for input(s): PHART, HCO3 in the last 72 hours.  Invalid input(s): PCO2, PO2  Studies/Results: No results found.  Anti-infectives: Anti-infectives    Start     Dose/Rate Route Frequency Ordered Stop   02/11/15 2215  ciprofloxacin (CIPRO) tablet 500 mg     500 mg Oral 2 times daily 02/11/15 2207     02/11/15 1000  ciprofloxacin (CIPRO) IVPB 400 mg  Status:  Discontinued     400 mg 200 mL/hr over 60 Minutes Intravenous Every 12 hours 02/11/15 0937 02/11/15 2207   02/11/15 0915  metroNIDAZOLE (FLAGYL) IVPB 500 mg     500 mg 100 mL/hr over 60 Minutes Intravenous Every 8 hours 02/11/15 0909        Assessment/Plan: s/p LLQ diverticular abscess drainage 2/11 (left AMA 2/11, readmitted 2/13 with abd pain); check final cx's; WBC 11.0; cont drain NS irrigation; check f/u CT on 2/16; other  plans as per IM/CCS  LOS: 1 day    ALLRED,D [facility] 02/12/2015

## 2015-02-13 ENCOUNTER — Encounter (HOSPITAL_COMMUNITY): Payer: Self-pay | Admitting: General Practice

## 2015-02-13 DIAGNOSIS — I808 Phlebitis and thrombophlebitis of other sites: Secondary | ICD-10-CM

## 2015-02-13 DIAGNOSIS — K5720 Diverticulitis of large intestine with perforation and abscess without bleeding: Principal | ICD-10-CM

## 2015-02-13 LAB — CBC
HEMATOCRIT: 37 % (ref 36.0–46.0)
Hemoglobin: 12.2 g/dL (ref 12.0–15.0)
MCH: 32.6 pg (ref 26.0–34.0)
MCHC: 33 g/dL (ref 30.0–36.0)
MCV: 98.9 fL (ref 78.0–100.0)
Platelets: 279 10*3/uL (ref 150–400)
RBC: 3.74 MIL/uL — ABNORMAL LOW (ref 3.87–5.11)
RDW: 13.3 % (ref 11.5–15.5)
WBC: 8.7 10*3/uL (ref 4.0–10.5)

## 2015-02-13 LAB — HEPARIN LEVEL (UNFRACTIONATED): HEPARIN UNFRACTIONATED: 0.47 [IU]/mL (ref 0.30–0.70)

## 2015-02-13 MED ORDER — SODIUM CHLORIDE 0.9 % IV SOLN
3.0000 g | Freq: Four times a day (QID) | INTRAVENOUS | Status: DC
  Administered 2015-02-13 – 2015-02-15 (×8): 3 g via INTRAVENOUS
  Filled 2015-02-13 (×10): qty 3

## 2015-02-13 NOTE — Progress Notes (Signed)
Referring Physician(s): CCS  Subjective:  Diverticular abscess drain placed 2/11 Wbc wnl Eating soft diet now Better daily   Allergies: Propoxyphene n-acetaminophen and Strawberry  Medications: Prior to Admission medications   Medication Sig Start Date End Date Taking? Authorizing Provider  albuterol (PROVENTIL HFA;VENTOLIN HFA) 108 (90 BASE) MCG/ACT inhaler Inhale 1-2 puffs into the lungs every 4 (four) hours as needed for wheezing or shortness of breath. 08/29/14  Yes Charlott Rakes, MD  aspirin EC 81 MG tablet Take 81 mg by mouth daily.   Yes Historical Provider, MD  atorvastatin (LIPITOR) 40 MG tablet Take 1 tablet (40 mg total) by mouth at bedtime. 11/05/13  Yes Ejiroghene E Emokpae, MD  buPROPion (WELLBUTRIN XL) 300 MG 24 hr tablet Take 300 mg by mouth daily. 12/11/14  Yes Historical Provider, MD  carvedilol (COREG) 25 MG tablet Take 1 tablet (25 mg total) by mouth 2 (two) times daily with a meal. 02/28/14  Yes Karren Cobble, MD  ciprofloxacin (CIPRO) 500 MG tablet Take 1 tablet (500 mg total) by mouth 2 (two) times daily. Patient taking differently: Take 500 mg by mouth 2 (two) times daily. Started on 02-09-15 02/09/15  Yes Kelby Aline, MD  diazepam (VALIUM) 5 MG tablet Take 10 mg by mouth 3 (three) times daily.    Yes Historical Provider, MD  DULoxetine (CYMBALTA) 60 MG capsule Take 60 mg by mouth daily.    Yes Historical Provider, MD  gabapentin (NEURONTIN) 800 MG tablet Take 1 tablet in the morning, 1 tablet midday and 2 tablets at bedtime 01/02/15  Yes Julious Oka, MD  hydrochlorothiazide (HYDRODIURIL) 12.5 MG tablet Take 1 tablet (12.5 mg total) by mouth daily. 08/11/14  Yes Julious Oka, MD  hydrOXYzine (ATARAX/VISTARIL) 25 MG tablet Take 25 mg by mouth 3 (three) times daily as needed for anxiety.   Yes Historical Provider, MD  lisinopril (PRINIVIL,ZESTRIL) 20 MG tablet Take 1 tablet (20 mg total) by mouth 2 (two) times daily. 08/11/14 08/11/15 Yes Julious Oka, MD    metroNIDAZOLE (FLAGYL) 500 MG tablet Take 1 tablet (500 mg total) by mouth 3 (three) times daily. Patient taking differently: Take 500 mg by mouth 3 (three) times daily. Started on 02-09-15 02/09/15  Yes Kelby Aline, MD  Multiple Vitamins-Minerals (ONE-A-DAY 50 PLUS PO) Take 1 tablet by mouth daily.   Yes Historical Provider, MD  potassium chloride (K-DUR) 10 MEQ tablet TAKE 1 TABLET BY MOUTH EVERY DAY 08/29/14  Yes Thayer Headings, MD  warfarin (COUMADIN) 7.5 MG tablet Take 3.75-7.5 mg by mouth daily. Take 3.75mg  alternating with 7.5mg  daily Mon wed and fridays take 3.75mg  rest of days take 7.5mg    Yes Historical Provider, MD  ziprasidone (GEODON) 60 MG capsule Take 60 mg by mouth 2 (two) times daily with a meal.    Yes Historical Provider, MD     Vital Signs: BP 149/97 mmHg  Pulse 74  Temp(Src) 98.1 F (36.7 C) (Oral)  Resp 18  Ht 5' 4.5" (1.638 m)  Wt 85.73 kg (189 lb)  BMI 31.95 kg/m2  SpO2 100%  LMP 01/22/2015  Physical Exam  Abdominal:  Wbc wnl afeb Site clean and dry NT Output milky yellow 10 cc in JP + strept     Imaging: No results found.  Labs:  CBC:  Recent Labs  02/09/15 0649 02/11/15 1105 02/12/15 0050 02/13/15 0427  WBC 12.1* 10.5 11.0* 8.7  HGB 12.2 12.7 12.7 12.2  HCT 37.5 38.2 39.0 37.0  PLT 241  289 299 279    COAGS:  Recent Labs  02/07/15 0336 02/08/15 0821 02/09/15 0649 02/11/15 1105  INR 1.21 1.48 1.38 1.80*    BMP:  Recent Labs  02/08/15 0821 02/09/15 0649 02/11/15 1105 02/12/15 0050  NA 138 138 140 139  K 3.6 3.6 4.0 3.9  CL 103 104 102 107  CO2 23 25 29 27   GLUCOSE 86 117* 92 93  BUN 6 <5* <5* <5*  CALCIUM 8.8 8.8 8.8 8.5  CREATININE 0.74 0.73 0.77 0.77  GFRNONAA >90 >90 >90 >90  GFRAA >90 >90 >90 >90    LIVER FUNCTION TESTS:  Recent Labs  05/03/14 1015 02/06/15 1026 02/11/15 1105  BILITOT 0.3 1.5* 0.4  AST 14 20 17   ALT 12 16 11   ALKPHOS 62 72 54  PROT 7.4 7.2 6.2  ALBUMIN 4.0 3.1* 2.8*     Assessment and Plan:  divertic abscess drain placed 2/11 Better daily For re CT 2/16  Signed: Trisa Cranor A 02/13/2015, 11:01 AM   I spent a total of 15 min in face to face in clinical consultation/evaluation, greater than 50% of which was counseling/coordinating care for divertic abscess drain

## 2015-02-13 NOTE — Progress Notes (Signed)
Subjective: [name]. Alisha [name] said she spoke to the social worker which was really helpful and she would like to speak to her again. She said the abdominal pain is "the same and better" but would not elaborate further. She said she is tolerating her full liquid diet well. She says she is still agreeable to staying as long as she needs treatment. I spoke with surgery who said she said her pain was much worse to them. Alisha [name] became upset at them when told we will not advance her diet further until she has CT tomorrow.   Objective: Vital signs in last 24 hours: Filed Vitals:   02/12/15 1440 02/12/15 2141 02/13/15 0158 02/13/15 0627  BP: 137/69 131/86 120/57 149/97  Pulse: 80 77 74 74  Temp: 99.6 F (37.6 C) 98.4 F (36.9 C) 98.2 F (36.8 C) 98.1 F (36.7 C)  TempSrc: Oral Oral Oral Oral  Resp: 20 20 18 18   Height:      Weight:    189 lb (85.73 kg)  SpO2: 97% 99% 100% 100%   Weight change: 10 lb 10.4 oz (4.83 kg)  Intake/Output Summary (Last 24 hours) at 02/13/15 1050 Last data filed at 02/13/15 0853  Gross per 24 hour  Intake 2559.19 ml  Output   2810 ml  Net -250.81 ml   Gen: A&O x 4, no acute distress standing in room, well developed, well nourished HEENT: Atraumatic, PERRL, EOMI, sclerae anicteric, moist mucous membranes Heart: Regular rate and rhythm, normal S1 S2, no murmurs, rubs, or gallops Lungs: Clear to auscultation bilaterally, respirations unlabored Abd: Soft, diffuse tenderness on palpation worse in lower abdomen, IR drain with minimal amount of cloudy serosanguinous output, non-distended, + bowel sounds, no hepatosplenomegaly Ext: L arm less tender and swollen at site of previous IV, no edema or cyanosis  Lab Results: Basic Metabolic Panel:  Recent Labs Lab 02/11/15 1105 02/12/15 0050  NA 140 139  K 4.0 3.9  CL 102 107  CO2 29 27  GLUCOSE 92 93  BUN <5* <5*  CREATININE 0.77 0.77  CALCIUM 8.8 8.5   Liver Function Tests:  Recent Labs Lab  02/11/15 1105  AST 17  ALT 11  ALKPHOS 54  BILITOT 0.4  PROT 6.2  ALBUMIN 2.8*   No results for input(s): LIPASE, AMYLASE in the last 168 hours. CBC:  Recent Labs Lab 02/12/15 0050 02/13/15 0427  WBC 11.0* 8.7  HGB 12.7 12.2  HCT 39.0 37.0  MCV 99.5 98.9  PLT 299 279   Coagulation:  Recent Labs Lab 02/07/15 0336 02/08/15 0821 02/09/15 0649 02/11/15 1105  LABPROT 15.4* 18.1* 17.1* 21.1*  INR 1.21 1.48 1.38 1.80*   Urinalysis:  Recent Labs Lab 02/06/15 1159  COLORURINE AMBER*  LABSPEC 1.040*  PHURINE 6.0  GLUCOSEU NEGATIVE  HGBUR NEGATIVE  BILIRUBINUR LARGE*  KETONESUR 40*  PROTEINUR >300*  UROBILINOGEN 1.0  NITRITE NEGATIVE  LEUKOCYTESUR SMALL*   Micro Results: Recent Results (from the past 240 hour(s))  Culture, Urine     Status: None   Collection Time: 02/06/15 11:57 AM  Result Value Ref Range Status   Specimen Description URINE, CLEAN CATCH  Final   Special Requests ADDED 2243  Final   Colony Count   Final    >=100,000 COLONIES/ML Performed at [facility]    Culture   Final    Multiple bacterial morphotypes present, none predominant. Suggest appropriate recollection if clinically indicated. Performed at [facility]    Report Status 02/08/2015  FINAL  Final  Culture, blood (routine x 2)     Status: None (Preliminary result)   Collection Time: 02/08/15  8:36 AM  Result Value Ref Range Status   Specimen Description BLOOD RIGHT HAND  Final   Special Requests BOTTLES DRAWN AEROBIC ONLY 5CC  Final   Culture   Final           BLOOD CULTURE RECEIVED NO GROWTH TO DATE CULTURE WILL BE HELD FOR 5 DAYS BEFORE ISSUING A FINAL NEGATIVE REPORT Performed at [facility]    Report Status PENDING  Incomplete  Culture, blood (routine x 2)     Status: None (Preliminary result)   Collection Time: 02/08/15  8:48 AM  Result Value Ref Range Status   Specimen Description BLOOD LEFT HAND  Final   Special Requests BOTTLES DRAWN AEROBIC  ONLY 5CC  Final   Culture   Final           BLOOD CULTURE RECEIVED NO GROWTH TO DATE CULTURE WILL BE HELD FOR 5 DAYS BEFORE ISSUING A FINAL NEGATIVE REPORT Performed at [facility]    Report Status PENDING  Incomplete  Culture, routine-abscess     Status: None   Collection Time: 02/09/15 10:38 AM  Result Value Ref Range Status   Specimen Description ABSCESS ABDOMEN  Final   Special Requests DRAIN FROM LOWER ABD  Final   Gram Stain   Final    ABUNDANT WBC PRESENT, PREDOMINANTLY PMN NO SQUAMOUS EPITHELIAL CELLS SEEN ABUNDANT GRAM POSITIVE COCCI IN PAIRS ABUNDANT GRAM NEGATIVE RODS Performed at [facility]    Culture   Final    ABUNDANT MICROAEROPHILIC STREPTOCOCCI Note: Standardized susceptibility testing for this organism is not available. Performed at [facility]    Report Status 02/12/2015 FINAL  Final  Anaerobic culture     Status: None (Preliminary result)   Collection Time: 02/09/15 10:38 AM  Result Value Ref Range Status   Specimen Description ABSCESS ABDOMEN  Final   Special Requests DRAIN FROM LOWER ABD  Final   Gram Stain PENDING  Incomplete   Culture   Final    NO ANAEROBES ISOLATED; CULTURE IN PROGRESS FOR 5 DAYS Performed at [facility]    Report Status PENDING  Incomplete  Culture, blood (routine x 2)     Status: None (Preliminary result)   Collection Time: 02/11/15 11:05 AM  Result Value Ref Range Status   Specimen Description BLOOD RIGHT ARM  Final   Special Requests BOTTLES DRAWN AEROBIC ONLY 6CC  Final   Culture   Final           BLOOD CULTURE RECEIVED NO GROWTH TO DATE CULTURE WILL BE HELD FOR 5 DAYS BEFORE ISSUING A FINAL NEGATIVE REPORT Performed at [facility]    Report Status PENDING  Incomplete  Culture, blood (routine x 2)     Status: None (Preliminary result)   Collection Time: 02/11/15 11:11 AM  Result Value Ref Range Status   Specimen Description BLOOD RIGHT HAND  Final   Special Requests BOTTLES  DRAWN AEROBIC AND ANAEROBIC 10CC  Final   Culture   Final           BLOOD CULTURE RECEIVED NO GROWTH TO DATE CULTURE WILL BE HELD FOR 5 DAYS BEFORE ISSUING A FINAL NEGATIVE REPORT Note: Culture results may be compromised due to an excessive volume of blood received in culture bottles. Performed at [place] [facility] PENDING  Incomplete   Left upper extremity doppler: findings consistent with acute superficial vein thrombosis involving the left cephalic vein in the forearm only.  Studies/Results: No results found. Medications: I have reviewed the patient's current medications. Scheduled Meds: . aspirin EC  81 mg Oral Daily  . atorvastatin  40 mg Oral QHS  . buPROPion  300 mg Oral Daily  . carvedilol  25 mg Oral BID WC  . diazepam  10 mg Oral TID  . DULoxetine  60 mg Oral Daily  . gabapentin  1,600 mg Oral QHS  . gabapentin  800 mg Oral BID WC  . ziprasidone  60 mg Oral BID WC   Continuous Infusions: . heparin 1,450 Units/hr (02/13/15 0256)   PRN Meds:.albuterol, HYDROmorphone (DILAUDID) injection, hydrOXYzine Assessment/Plan: Principal Problem:   Diverticulitis of intestine with abscess Active Problems:   Depression   Long term current use of anticoagulant   Anxiety   HTN (hypertension)   Colonic diverticular abscess   Superficial venous thrombosis of arm  #Diverticulitis with Abscess: Alisha [name] symptoms have improved but stable from yesterday. Leukocytosis has normalized. She has sigmoid diverticulitis and CT w oral contrast demonstrated adjacent 7.8 x 4.6 cm complex fluid collection consistent with diverticular abscess. She had successful placement of an IR drain with no complications the morning of 02/09/15 but left later that day AMA. IR saw the patient on 2/14 and recommend continued NS irrigation of the drain.Abscess sample shows microaerophilic streptococci. BCx x 2 NGTD. She took po cipro flagyl while out of hospital and had been restarted on iv cipro  flagyl. General surgery recommends repeat CT 2/16 and full liquid diet until results available -appreciate gen surg, IR -repeat CT abdomen 2/16 -d/c iv cipro and metronidazole -unasyn per pharmacy as growing streptococci -dilaudid 0.5 mg iv q4hprn -f/u cultures -full liquid diet  #L arm phlebitis: Alisha [name] L arm acute superficial vein thrombosis of L cephalic vein noted on doppler at site of previous iv on last admission continues to improve with compression and elevation.  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 She was subtherapeutic with INR 1.21 on previous presentation and started on a heparin bridge. Her INR on return this admission was 1.80. We are currently holding her warfarin until surgery decides following 2/16 CT whether she needs another procedure. -heparin per pharmacy pending INR  #CHF: Non-contributory. Echo 2007 45% EF per EMR. At home she is on ASA 81 mg daily, carvedilol 25 mg bid, lisinopril 20 mg bid, KDur 10 mEq. -cont ASA 81 mg daily, carvedilol 25 mg bid -hold lisinopril 20 mg bid, KDur 10 mEq as BP 120s-140s/50s-90s and she gets contrast for CT tomorrow  #CAD: Non-contributory. 02/10/14 stress test low risk but anterior abn, Cath on 02/25/14 nonobstructive CAD, normal LV function 55-65%.  At home she is on ASA 81 mg daily, carvedilol 25 mg bid, lisinopril 20 mg bid, atorvastatin 40 mg daily. -cont ASA 81 mg daily, carvedilol 25 mg bid, atorvastatin 40 mg daily -hold for now lisinopril 20 mg bid  #HTN: BP 120s-140s/50s-90s overnight. At home she is on ASA 81 mg daily, carvedilol 25 mg bid, lisinopril 20 mg bid, HCTZ 12.5 mg daily, atorvastatin 40 mg daily. -cont ASA 81 mg daily, carvedilol 25 mg bid, atorvastatin 40 mg daily -hold lisinopril 20 mg bid, HCTZ 12.5 mg daily for now and reassess as noted above  #HLD: Last lipid profile 10/01/2011 w cholesterol 162, triglyceride  205, HDL 25,  LDL 96. At home she is on atorvastatin 40 mg daily. -cont atorvastatin 40 mg daily.  #Diet: full liquid  #DVT PPx: heparin per pharmacy  #Code: Full  Dispo: Disposition is deferred at this time, awaiting improvement of current medical problems.  Anticipated discharge in approximately 2 day(s).   The patient does have a current PCP Julious Oka, MD) and does need an [facility] hospital follow-up appointment after discharge.  The patient does not know have transportation limitations that hinder transportation to clinic appointments.  .Services Needed at time of discharge: Y = Yes, Blank = No PT:   OT:   RN:   Equipment:   Other:     LOS: 2 days   Kelby Aline, MD 02/13/2015, 10:50 AM

## 2015-02-13 NOTE — Progress Notes (Addendum)
Alisha [name] for Heparin Indication: history of DVT (warfarin held for possible surgery)  Allergies  Allergen Reactions  . Propoxyphene N-Acetaminophen Swelling  . Strawberry     Electric shock     Patient Measurements: Height: 5' 4.5" (163.8 cm) Weight: 189 lb (85.73 kg) (Scale B) IBW/kg (Calculated) : 55.85 Heparin Dosing Weight: 73 kg  Vital Signs: Temp: 98.1 F (36.7 C) (02/15 0627) Temp Source: Oral (02/15 0627) BP: 149/97 mmHg (02/15 0627) Pulse Rate: 74 (02/15 0627)  Labs:  Recent Labs  02/11/15 1105  02/12/15 0050 02/12/15 0905 02/12/15 1510 02/13/15 0427  HGB 12.7  --  12.7  --   --  12.2  HCT 38.2  --  39.0  --   --  37.0  PLT 289  --  299  --   --  279  LABPROT 21.1*  --   --   --   --   --   INR 1.80*  --   --   --   --   --   HEPARINUNFRC  --   < > 0.17* 0.41 0.46 0.47  CREATININE 0.77  --  0.77  --   --   --   < > = values in this interval not displayed.  Estimated Creatinine Clearance: 88 mL/min (by C-G formula based on Cr of 0.77).   Medical History: Past Medical History  Diagnosis Date  . DVT (deep venous thrombosis)     bilateral, 2 episodes: Requires lifelong therapy  . Chronic pain syndrome   . Restless leg syndrome   . Depression   . Hyperlipidemia   . Hypertension   . PVD (peripheral vascular disease)     s/p L fem-pop bypass 11/21/08; graft occluded 12/28/08;  aortogram w/ bilat LE runoff: 1.  bilat diffuse SFA occlusive dz, 2.  Mod to severe above-knee popliteal dz,  3.  Bilat 3-vessel runoff w/ mild tibial occlusive dz.  . Tobacco abuse   . Degenerative joint disease of knee   . Breast lump 02/2008    Biopsy 05/2008: showed no evidence of malignancy  . Irregular menses 9/08  . Pulmonary edema   . Anxiety   . Joint pain   . Mixed stress and urge urinary incontinence     Being followed by [facility], underwent cystoscopy & uroflowmetry   . Endometrial mass 10/12/2012    Endometrium,  biopsy on 11/04/12 - PROLIFERATIVE ENDOMETRIUM AND ABUNDANT MUCUS. NO HYPERPLASIA OR CARCINOMA.   . Sigmoid diverticulitis 10/26/2012  . Asthma   . Breast mass in female     bi lat    Assessment: 54 year old F admitted on 02/11/2015 for continued treatment of diverticulitis complicated by abscess (JP drain in place). She is on chronic coumadin for history of DVT now to change to IV heparin as patient may need surgery.  F/U CT scan scheduled for 2/16.  Heparin therapeutic on 1450 units/hr.  INR on admission was SUBtherapeutic at 1.8. Now on Cipro and Flagyl which can affect the INR.  Will order INR in AM in the event that Coumadin is restarted tomorrow.  Home Coumadin dose: 7.5 mg daily except 11.25 mg MWF (based on coumadin clinic visit on 2/1 and verified with patient; last dose on 2/12)   Goal of Therapy:  Heparin level 0.3-0.7 units/ml Monitor platelets by anticoagulation protocol: Yes   Plan:  Continue heparin gtt @ 1450 units/hr Daily heparin level/CBC, s/s bleeding F/u restart Coumadin after repeat CT  on Tuesday Will order INR in AM in case Coumadin reordered Continue Cipro 500 mg PO bid Change Flagyl to PO Monitor renal function, temp, WBC, C&S   Hopie Pellegrin, Pharm.D., BCPS Clinical Pharmacist Pager [phone] 02/13/2015 10:47 AM   Addendum:  To change antibiotics to IV Unasyn.  D/C Cipro/Flagyl.  Start Unasyn 3gm IV q6h.  Dose adjustment not required unless CrCl <30 ml/min.  [employer], Pharm.D., BCPS Clinical Pharmacist Pager [phone] 02/13/2015 10:51 AM

## 2015-02-13 NOTE — Progress Notes (Signed)
Patient ID: Alisha [name], female   DOB: 1961/04/29, 54 y.o.   MRN: [id number]    Subjective: Pt c/o of LLQ that is bad enough she is having a hard time resting and still wanting IV pain medications.  She then complains of being hungry and wanting more to eat.  Objective: Vital signs in last 24 hours: Temp:  [98.1 F (36.7 C)-99.6 F (37.6 C)] 98.1 F (36.7 C) (02/15 0627) Pulse Rate:  [74-80] 74 (02/15 0627) Resp:  [18-20] 18 (02/15 0627) BP: (120-149)/(57-97) 149/97 mmHg (02/15 0627) SpO2:  [97 %-100 %] 100 % (02/15 0627) Weight:  [189 lb (85.73 kg)] 189 lb (85.73 kg) (02/15 0627) Last BM Date: 02/11/15  Intake/Output from previous day: 02/14 0701 - 02/15 0700 In: 3584.7 [P.O.:1840; I.V.:1444.7; IV Piggyback:300] Out: 2810 [Urine:2800; Drains:10] Intake/Output this shift:    PE: Abd: soft, some tenderness in LLQ, but mild, +BS, obese, Drain with cloudy serous output  Lab Results:   Recent Labs  02/12/15 0050 02/13/15 0427  WBC 11.0* 8.7  HGB 12.7 12.2  HCT 39.0 37.0  PLT 299 279   BMET  Recent Labs  02/11/15 1105 02/12/15 0050  NA 140 139  K 4.0 3.9  CL 102 107  CO2 29 27  GLUCOSE 92 93  BUN <5* <5*  CREATININE 0.77 0.77  CALCIUM 8.8 8.5   PT/INR  Recent Labs  02/11/15 1105  LABPROT 21.1*  INR 1.80*   CMP     Component Value Date/Time   NA 139 02/12/2015 0050   K 3.9 02/12/2015 0050   CL 107 02/12/2015 0050   CO2 27 02/12/2015 0050   GLUCOSE 93 02/12/2015 0050   BUN <5* 02/12/2015 0050   CREATININE 0.77 02/12/2015 0050   CREATININE 0.83 07/16/2013 1105   CALCIUM 8.5 02/12/2015 0050   PROT 6.2 02/11/2015 1105   ALBUMIN 2.8* 02/11/2015 1105   AST 17 02/11/2015 1105   ALT 11 02/11/2015 1105   ALKPHOS 54 02/11/2015 1105   BILITOT 0.4 02/11/2015 1105   GFRNONAA >90 02/12/2015 0050   GFRNONAA >60 07/29/2011 1220   GFRAA >90 02/12/2015 0050   GFRAA >60 07/29/2011 1220   Lipase     Component Value Date/Time   LIPASE 21 02/06/2015 1026        Studies/Results: No results found.  Anti-infectives: Anti-infectives    Start     Dose/Rate Route Frequency Ordered Stop   02/11/15 2215  ciprofloxacin (CIPRO) tablet 500 mg     500 mg Oral 2 times daily 02/11/15 2207     02/11/15 1000  ciprofloxacin (CIPRO) IVPB 400 mg  Status:  Discontinued     400 mg 200 mL/hr over 60 Minutes Intravenous Every 12 hours 02/11/15 0937 02/11/15 2207   02/11/15 0915  metroNIDAZOLE (FLAGYL) IVPB 500 mg     500 mg 100 mL/hr over 60 Minutes Intravenous Every 8 hours 02/11/15 0909         Assessment/Plan  1. Diverticulitis with abscess, s/p perc drain  2. Medical noncompliance 3. 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 Patient needs to remain on full liquids today.  The first thing she complained about is all the pain she is having in her LLQ, but then wants more to eat.  I explained to her why I will not advance her past full liquids due to her pain.  I explained that eating solid food if she still has active inflammation and pain, may make this worse and not  allow [name] to treat her conservatively to avoid an operation.  She then began to ignore me and would not speak to me any further today. 2. I did relay this to her primary team so they could be aware of keeping her at full liquids today.  We will repeat her CT scan tomorrow to re-evaluate her diverticulitis as well as her abscess.  At this time, we can decide if her diet can be advanced.  I also explained this to the patient as well.  LOS: 2 days    Alisha [name] E 02/13/2015, 8:02 AM Pager: [phone]

## 2015-02-13 NOTE — Progress Notes (Signed)
UR Completed.  [phone]  

## 2015-02-13 NOTE — Care Management Note (Signed)
    Page 1 of 1   02/15/2015     3:50:22 PM CARE MANAGEMENT NOTE 02/15/2015  Patient:  Alisha [name], Alisha [name]   Account Number:  [id number]  Date Initiated:  02/13/2015  Documentation initiated by:  Christin Mccreedy  Subjective/Objective Assessment:   Pt adm on 02/11/15 with diverticulitis with abscess, UTI. PTA, pt independent, resides at home with spouse.     Action/Plan:   Will follow for dc needs as pt progresse.s   Anticipated DC Date:  02/15/2015   Anticipated DC Plan:  [place]  CM consult      [facility] Choice  HOME HEALTH   Choice offered to / List presented to:  C-1 Patient        [place] arranged  HH-1 RN      [place].   Status of service:  Completed, signed off Medicare Important Message given?  YES (If response is "NO", the following Medicare IM given date fields will be blank) Date Medicare IM given:  02/14/2015 Medicare IM given by:  Felesha Moncrieffe Date Additional Medicare IM given:   Additional Medicare IM given by:    Discharge Disposition:  [place]  Per UR Regulation:  Reviewed for med. necessity/level of care/duration of stay  If discussed at [place] of Stay Meetings, dates discussed:    Comments:  02/15/15 Ellan Lambert, RN, BSN [phone] Pt for dc home today.  Will need HHRN for assistance with drain care.  Pt agreeable to [facility] follow up; referral to [facility], per pt choice.  Start of care 24-48h post dc date.  Pt states she does not have any minutes on her phone currently for phone calls, but has unlimited texts.  [facility] made aware that pt would prefer text message communication.  Phone # [phone].

## 2015-02-13 NOTE — Progress Notes (Signed)
MD, pt is requesting a PICC line, due to being stuck so much and her L arm and hand is still swollen and does not want to be stuck there, thanks Arvella Nigh RN

## 2015-02-13 NOTE — Progress Notes (Signed)
Nutrition Brief Note  Patient identified on the Malnutrition Screening Tool (MST) Report  Wt Readings from Last 15 Encounters:  02/13/15 189 lb (85.73 kg)  02/06/15 176 lb 5.9 oz (80 kg)  08/29/14 196 lb 13.9 oz (89.3 kg)  08/11/14 204 lb (92.534 kg)  07/13/14 203 lb (92.08 kg)  07/08/14 203 lb (92.08 kg)  07/08/14 203 lb 12.8 oz (92.443 kg)  06/23/14 206 lb 11.2 oz (93.759 kg)  06/06/14 180 lb (81.647 kg)  05/26/14 204 lb 3.2 oz (92.625 kg)  05/03/14 196 lb 2 oz (88.962 kg)  04/11/14 196 lb 3.2 oz (88.996 kg)  03/25/14 207 lb (93.895 kg)  03/25/14 205 lb 1.9 oz (93.042 kg)  02/25/14 199 lb (90.266 kg)    Body mass index is 31.95 kg/(m^2). Patient meets criteria for Obesity based on current BMI. Pt denies any recent weight loss stating that her weight fluctuates between 160 and 200 lbs. She states her appetite is good and she was eating well PTA.   Current diet order is Full Liquids, patient is consuming approximately 100% of meals at this time and asking for additional snacks. Pt requested information on her prescribed diet- full liquids/Soft diet.   RD provided for nutrition education regarding nutrition management for diverticulosis/ Diverticulitis.    RD provided "Low Fiber Nutrition Therapy" handout as well as "5 Sample Menus for Gradually Increasing Fiber" handout from the Academy of [employer] and Dietetics. Reviewed patient's dietary recall and discussed ways for pt to meet nutrition goals over the next several weeks. Explained reasons for pt to follow a low fiber diet over the next few weeks. Reviewed low fiber foods and high fiber foods. Discussed best practice for long term management of diverticulosis is a high fiber diet and discussed ways to gradually increased fiber in the diet.   Teach back method used. Pt verbalizes understanding of information provided.   Expect good compliance. Labs and medications reviewed.   No further nutrition interventions warranted at this  time. If nutrition issues arise, please consult RD.   Pryor Ochoa RD, LDN Inpatient Clinical Dietitian Pager: [phone] After Hours Pager: [phone]

## 2015-02-14 ENCOUNTER — Encounter (HOSPITAL_COMMUNITY): Payer: Self-pay | Admitting: *Deleted

## 2015-02-14 ENCOUNTER — Inpatient Hospital Stay (HOSPITAL_COMMUNITY): Payer: Medicare Other

## 2015-02-14 DIAGNOSIS — I2510 Atherosclerotic heart disease of native coronary artery without angina pectoris: Secondary | ICD-10-CM

## 2015-02-14 DIAGNOSIS — I10 Essential (primary) hypertension: Secondary | ICD-10-CM

## 2015-02-14 DIAGNOSIS — I509 Heart failure, unspecified: Secondary | ICD-10-CM

## 2015-02-14 DIAGNOSIS — E785 Hyperlipidemia, unspecified: Secondary | ICD-10-CM

## 2015-02-14 DIAGNOSIS — I809 Phlebitis and thrombophlebitis of unspecified site: Secondary | ICD-10-CM

## 2015-02-14 DIAGNOSIS — K5780 Diverticulitis of intestine, part unspecified, with perforation and abscess without bleeding: Secondary | ICD-10-CM

## 2015-02-14 DIAGNOSIS — I82409 Acute embolism and thrombosis of unspecified deep veins of unspecified lower extremity: Secondary | ICD-10-CM

## 2015-02-14 LAB — CULTURE, BLOOD (ROUTINE X 2)
Culture: NO GROWTH
Culture: NO GROWTH

## 2015-02-14 LAB — CBC
HCT: 38.6 % (ref 36.0–46.0)
Hemoglobin: 12.2 g/dL (ref 12.0–15.0)
MCH: 31.3 pg (ref 26.0–34.0)
MCHC: 31.6 g/dL (ref 30.0–36.0)
MCV: 99 fL (ref 78.0–100.0)
PLATELETS: 301 10*3/uL (ref 150–400)
RBC: 3.9 MIL/uL (ref 3.87–5.11)
RDW: 13.4 % (ref 11.5–15.5)
WBC: 8.7 10*3/uL (ref 4.0–10.5)

## 2015-02-14 LAB — PROTIME-INR
INR: 1.99 — ABNORMAL HIGH (ref 0.00–1.49)
PROTHROMBIN TIME: 22.8 s — AB (ref 11.6–15.2)

## 2015-02-14 LAB — HEPARIN LEVEL (UNFRACTIONATED)
HEPARIN UNFRACTIONATED: 0.76 [IU]/mL — AB (ref 0.30–0.70)
Heparin Unfractionated: 0.14 IU/mL — ABNORMAL LOW (ref 0.30–0.70)

## 2015-02-14 MED ORDER — WARFARIN - PHARMACIST DOSING INPATIENT
Freq: Every day | Status: DC
  Administered 2015-02-14: 18:00:00

## 2015-02-14 MED ORDER — HYDRALAZINE HCL 20 MG/ML IJ SOLN
5.0000 mg | Freq: Four times a day (QID) | INTRAMUSCULAR | Status: DC | PRN
Start: 2015-02-14 — End: 2015-02-14

## 2015-02-14 MED ORDER — IOHEXOL 300 MG/ML  SOLN
100.0000 mL | Freq: Once | INTRAMUSCULAR | Status: AC | PRN
  Administered 2015-02-14: 100 mL via INTRAVENOUS

## 2015-02-14 MED ORDER — LISINOPRIL 20 MG PO TABS
20.0000 mg | ORAL_TABLET | Freq: Two times a day (BID) | ORAL | Status: DC
  Administered 2015-02-14 – 2015-02-15 (×3): 20 mg via ORAL
  Filled 2015-02-14 (×5): qty 1

## 2015-02-14 MED ORDER — WARFARIN SODIUM 7.5 MG PO TABS
7.5000 mg | ORAL_TABLET | Freq: Once | ORAL | Status: AC
  Administered 2015-02-14: 7.5 mg via ORAL
  Filled 2015-02-14: qty 1

## 2015-02-14 MED ORDER — HYDRALAZINE HCL 20 MG/ML IJ SOLN
5.0000 mg | Freq: Three times a day (TID) | INTRAMUSCULAR | Status: DC | PRN
  Administered 2015-02-14: 5 mg via INTRAVENOUS
  Filled 2015-02-14: qty 1

## 2015-02-14 MED ORDER — TRAMADOL HCL 50 MG PO TABS
50.0000 mg | ORAL_TABLET | Freq: Four times a day (QID) | ORAL | Status: DC | PRN
  Administered 2015-02-14 – 2015-02-15 (×3): 50 mg via ORAL
  Filled 2015-02-14 (×3): qty 1

## 2015-02-14 MED ORDER — IOHEXOL 300 MG/ML  SOLN
25.0000 mL | INTRAMUSCULAR | Status: AC
Start: 2015-02-14 — End: 2015-02-14
  Administered 2015-02-14 (×2): 25 mL via ORAL

## 2015-02-14 NOTE — Progress Notes (Signed)
Subjective: [name]. Ms Redondo said her abdominal pain continues to improve. She is very hungry and was upset when I told her we would wait to advance her diet until she gets her CT today. She said her L arm has also improved. She is still agreeable with staying until she is appropriately discharged.    Objective: Vital signs in last 24 hours: Filed Vitals:   02/14/15 0211 02/14/15 0532 02/14/15 0603 02/14/15 0921  BP: 173/98  182/97 138/102  Pulse:   78 90  Temp:   97.6 F (36.4 C)   TempSrc:   Oral   Resp:   18   Height:      Weight:  188 lb 7.9 oz (85.5 kg)    SpO2:   92%    Weight change: -8.1 oz (-0.23 kg)  Intake/Output Summary (Last 24 hours) at 02/14/15 1013 Last data filed at 02/14/15 0946  Gross per 24 hour  Intake 3809.13 ml  Output    860 ml  Net 2949.13 ml   Gen: A&O x 4, no acute distress laying in bed, well developed, well nourished HEENT: Atraumatic, PERRL, EOMI, sclerae anicteric, moist mucous membranes Heart: Regular rate and rhythm, normal S1 S2, no murmurs, rubs, or gallops Lungs: Clear to auscultation bilaterally, respirations unlabored Abd: Soft, minimal tenderness on LLQ, IR drain with minimal amount of serosanguinous output, non-distended, + bowel sounds, no hepatosplenomegaly Ext: L arm minimally tender and swollen at site of previous IV, no edema or cyanosis  Lab Results: Basic Metabolic Panel:  Recent Labs Lab 02/11/15 1105 02/12/15 0050  NA 140 139  K 4.0 3.9  CL 102 107  CO2 29 27  GLUCOSE 92 93  BUN <5* <5*  CREATININE 0.77 0.77  CALCIUM 8.8 8.5   Liver Function Tests:  Recent Labs Lab 02/11/15 1105  AST 17  ALT 11  ALKPHOS 54  BILITOT 0.4  PROT 6.2  ALBUMIN 2.8*  CBC:  Recent Labs Lab 02/13/15 0427 02/14/15 0317  WBC 8.7 8.7  HGB 12.2 12.2  HCT 37.0 38.6  MCV 98.9 99.0  PLT 279 301   Coagulation:  Recent Labs Lab 02/08/15 0821 02/09/15 0649 02/11/15 1105 02/14/15 0317  LABPROT 18.1* 17.1* 21.1* 22.8*  INR  1.48 1.38 1.80* 1.99*   Micro Results: Recent Results (from the past 240 hour(s))  Culture, Urine     Status: None   Collection Time: 02/06/15 11:57 AM  Result Value Ref Range Status   Specimen Description URINE, CLEAN CATCH  Final   Special Requests ADDED 2243  Final   Colony Count   Final    >=100,000 COLONIES/ML Performed at [facility]    Culture   Final    Multiple bacterial morphotypes present, none predominant. Suggest appropriate recollection if clinically indicated. Performed at [facility]    Report Status 02/08/2015 FINAL  Final  Culture, blood (routine x 2)     Status: None   Collection Time: 02/08/15  8:36 AM  Result Value Ref Range Status   Specimen Description BLOOD RIGHT HAND  Final   Special Requests BOTTLES DRAWN AEROBIC ONLY 5CC  Final   Culture   Final    NO GROWTH 5 DAYS Performed at [facility]    Report Status 02/14/2015 FINAL  Final  Culture, blood (routine x 2)     Status: None   Collection Time: 02/08/15  8:48 AM  Result Value Ref Range Status   Specimen Description BLOOD LEFT HAND  Final   Special Requests BOTTLES DRAWN AEROBIC ONLY 5CC  Final   Culture   Final    NO GROWTH 5 DAYS Performed at [facility]    Report Status 02/14/2015 FINAL  Final  Culture, routine-abscess     Status: None   Collection Time: 02/09/15 10:38 AM  Result Value Ref Range Status   Specimen Description ABSCESS ABDOMEN  Final   Special Requests DRAIN FROM LOWER ABD  Final   Gram Stain   Final    ABUNDANT WBC PRESENT, PREDOMINANTLY PMN NO SQUAMOUS EPITHELIAL CELLS SEEN ABUNDANT GRAM POSITIVE COCCI IN PAIRS ABUNDANT GRAM NEGATIVE RODS Performed at [facility]    Culture   Final    ABUNDANT MICROAEROPHILIC STREPTOCOCCI Note: Standardized susceptibility testing for this organism is not available. Performed at [facility]    Report Status 02/12/2015 FINAL  Final  Anaerobic culture     Status: None (Preliminary  result)   Collection Time: 02/09/15 10:38 AM  Result Value Ref Range Status   Specimen Description ABSCESS ABDOMEN  Final   Special Requests DRAIN FROM LOWER ABD  Final   Gram Stain PENDING  Incomplete   Culture   Final    NO ANAEROBES ISOLATED; CULTURE IN PROGRESS FOR 5 DAYS Performed at [facility]    Report Status PENDING  Incomplete  Culture, blood (routine x 2)     Status: None (Preliminary result)   Collection Time: 02/11/15 11:05 AM  Result Value Ref Range Status   Specimen Description BLOOD RIGHT ARM  Final   Special Requests BOTTLES DRAWN AEROBIC ONLY 6CC  Final   Culture   Final           BLOOD CULTURE RECEIVED NO GROWTH TO DATE CULTURE WILL BE HELD FOR 5 DAYS BEFORE ISSUING A FINAL NEGATIVE REPORT Performed at [facility]    Report Status PENDING  Incomplete  Culture, blood (routine x 2)     Status: None (Preliminary result)   Collection Time: 02/11/15 11:11 AM  Result Value Ref Range Status   Specimen Description BLOOD RIGHT HAND  Final   Special Requests BOTTLES DRAWN AEROBIC AND ANAEROBIC 10CC  Final   Culture   Final           BLOOD CULTURE RECEIVED NO GROWTH TO DATE CULTURE WILL BE HELD FOR 5 DAYS BEFORE ISSUING A FINAL NEGATIVE REPORT Note: Culture results may be compromised due to an excessive volume of blood received in culture bottles. Performed at [facility]    Report Status PENDING  Incomplete   Left upper extremity doppler: findings consistent with acute superficial vein thrombosis involving the left cephalic vein in the forearm only.  Studies/Results: No results found. Medications: I have reviewed the patient's current medications. Scheduled Meds: . ampicillin-sulbactam (UNASYN) IV  3 g Intravenous Q6H  . aspirin EC  81 mg Oral Daily  . atorvastatin  40 mg Oral QHS  . buPROPion  300 mg Oral Daily  . carvedilol  25 mg Oral BID WC  . diazepam  10 mg Oral TID  . DULoxetine  60 mg Oral Daily  . gabapentin  1,600 mg Oral QHS   . gabapentin  800 mg Oral BID WC  . ziprasidone  60 mg Oral BID WC   Continuous Infusions: . heparin 1,600 Units/hr (02/14/15 0515)   PRN Meds:.albuterol, hydrALAZINE, hydrOXYzine, traMADol Assessment/Plan: Principal Problem:   Diverticulitis of intestine with abscess Active Problems:   Depression   Long  term current use of anticoagulant   Anxiety   HTN (hypertension)   Colonic diverticular abscess   Superficial venous thrombosis of arm  #Diverticulitis with Abscess: Ms Kiger symptoms continue to improve today as she wants to advance her diet. Leukocytosis remains normalized. She has sigmoid diverticulitis and CT w oral contrast demonstrated adjacent 7.8 x 4.6 cm complex fluid collection consistent with diverticular abscess. She had successful placement of an IR drain with no complications the morning of 02/09/15 but left later that day AMA. IR saw the patient on 2/14 and recommend continued NS irrigation of the drain.Abscess sample shows microaerophilic streptococci. BCx x 2 NGTD. IV cipro flagyl was transitioned to iv unasyn on 2/15 with culture results and plan for po augmentin. She is to get repeat CT today and then reassess. -appreciate gen surg, IR -repeat CT abdomen -unasyn per pharmacy as growing streptococci -change dilaudid 0.5 mg iv q4hprn to tramadol 50 mg q6hprn -full liquid diet pending CT results  #L arm phlebitis: Ms Galen's L arm acute superficial vein thrombosis of L cephalic vein noted on doppler at site of previous iv on last admission is nearly back to baseline with compression and elevation.  -cont compresses, elevation  #DVT: Ms Valtierra has a history of two previous DVTs on lifetime anticoagulation with warfarin. She was subtherapeutic with INR 1.21 on previous presentation and started on a heparin bridge. Her INR on return this admission was 1.80. We are currently holding her warfarin until surgery decides following 2/16 CT whether she needs another  procedure. -heparin per pharmacy pending gen surg recs after CT  #HTN: BP 150s-180s/90s-100s overnight and given prn hydralazine by night MD team. At home she is on ASA 81 mg daily, carvedilol 25 mg bid, lisinopril 20 mg bid, HCTZ 12.5 mg daily, atorvastatin 40 mg daily. -cont ASA 81 mg daily, carvedilol 25 mg bid, atorvastatin 40 mg daily -hold lisinopril 20 mg bid, HCTZ 12.5 mg daily for now but add on after CT  #CHF: Non-contributory. Echo 2007 45% EF per EMR. At home she is on ASA 81 mg daily, carvedilol 25 mg bid, lisinopril 20 mg bid, KDur 10 mEq. -cont ASA 81 mg daily, carvedilol 25 mg bid -hold lisinopril 20 mg bid, KDur 10 mEq until gets contrast for CT  #CAD: Non-contributory. 02/10/14 stress test low risk but anterior abn, Cath on 02/25/14 nonobstructive CAD, normal LV function 55-65%.  At home she is on ASA 81 mg daily, carvedilol 25 mg bid, lisinopril 20 mg bid, atorvastatin 40 mg daily. -cont ASA 81 mg daily, carvedilol 25 mg bid, atorvastatin 40 mg daily -hold for now lisinopril 20 mg bid pending CT today  #HLD: Last lipid profile 10/01/2011 w cholesterol 162, triglyceride 205, HDL 25, LDL 96. At home she is on atorvastatin 40 mg daily. -cont atorvastatin 40 mg daily.  #Diet: full liquid  #DVT PPx: heparin per pharmacy  #Code: Full  Dispo: Disposition is deferred at this time, awaiting improvement of current medical problems.  Anticipated discharge in approximately 2 day(s).   The patient does have a current PCP Julious Oka, MD) and does need an [facility] hospital follow-up appointment after discharge.  The patient does not know have transportation limitations that hinder transportation to clinic appointments.  .Services Needed at time of discharge: Y = Yes, Blank = No PT:   OT:   RN:   Equipment:   Other:     LOS: 3 days   Kelby Aline, MD 02/14/2015, 10:13 AM

## 2015-02-14 NOTE — Progress Notes (Signed)
Patient ID: Alisha [name], female   DOB: 1961-06-27, 54 y.o.   MRN: [id number]    Referring Physician(s): CCS  Subjective:  Pt on bedside commode; has had few loose stools today, mild intermittent abd pain but feels a little better  Allergies: Propoxyphene n-acetaminophen and Strawberry  Medications: Prior to Admission medications   Medication Sig Start Date End Date Taking? Authorizing Provider  albuterol (PROVENTIL HFA;VENTOLIN HFA) 108 (90 BASE) MCG/ACT inhaler Inhale 1-2 puffs into the lungs every 4 (four) hours as needed for wheezing or shortness of breath. 08/29/14  Yes Charlott Rakes, MD  aspirin EC 81 MG tablet Take 81 mg by mouth daily.   Yes Historical Provider, MD  atorvastatin (LIPITOR) 40 MG tablet Take 1 tablet (40 mg total) by mouth at bedtime. 11/05/13  Yes Ejiroghene E Emokpae, MD  buPROPion (WELLBUTRIN XL) 300 MG 24 hr tablet Take 300 mg by mouth daily. 12/11/14  Yes Historical Provider, MD  carvedilol (COREG) 25 MG tablet Take 1 tablet (25 mg total) by mouth 2 (two) times daily with a meal. 02/28/14  Yes Karren Cobble, MD  ciprofloxacin (CIPRO) 500 MG tablet Take 1 tablet (500 mg total) by mouth 2 (two) times daily. Patient taking differently: Take 500 mg by mouth 2 (two) times daily. Started on 02-09-15 02/09/15  Yes Kelby Aline, MD  diazepam (VALIUM) 5 MG tablet Take 10 mg by mouth 3 (three) times daily.    Yes Historical Provider, MD  DULoxetine (CYMBALTA) 60 MG capsule Take 60 mg by mouth daily.    Yes Historical Provider, MD  gabapentin (NEURONTIN) 800 MG tablet Take 1 tablet in the morning, 1 tablet midday and 2 tablets at bedtime 01/02/15  Yes Julious Oka, MD  hydrochlorothiazide (HYDRODIURIL) 12.5 MG tablet Take 1 tablet (12.5 mg total) by mouth daily. 08/11/14  Yes Julious Oka, MD  hydrOXYzine (ATARAX/VISTARIL) 25 MG tablet Take 25 mg by mouth 3 (three) times daily as needed for anxiety.   Yes Historical Provider, MD  lisinopril (PRINIVIL,ZESTRIL) 20 MG tablet Take  1 tablet (20 mg total) by mouth 2 (two) times daily. 08/11/14 08/11/15 Yes Julious Oka, MD  metroNIDAZOLE (FLAGYL) 500 MG tablet Take 1 tablet (500 mg total) by mouth 3 (three) times daily. Patient taking differently: Take 500 mg by mouth 3 (three) times daily. Started on 02-09-15 02/09/15  Yes Kelby Aline, MD  Multiple Vitamins-Minerals (ONE-A-DAY 50 PLUS PO) Take 1 tablet by mouth daily.   Yes Historical Provider, MD  potassium chloride (K-DUR) 10 MEQ tablet TAKE 1 TABLET BY MOUTH EVERY DAY 08/29/14  Yes Thayer Headings, MD  warfarin (COUMADIN) 7.5 MG tablet Take 3.75-7.5 mg by mouth daily. Take 3.75mg  alternating with 7.5mg  daily Mon wed and fridays take 3.75mg  rest of days take 7.5mg    Yes Historical Provider, MD  ziprasidone (GEODON) 60 MG capsule Take 60 mg by mouth 2 (two) times daily with a meal.    Yes Historical Provider, MD     Vital Signs: BP 138/102 mmHg  Pulse 90  Temp(Src) 97.6 F (36.4 C) (Oral)  Resp 18  Ht 5' 4.5" (1.638 m)  Wt 188 lb 7.9 oz (85.5 kg)  BMI 31.87 kg/m2  SpO2 92%  LMP 01/22/2015  Physical Exam pt awake/alert; LLQ drain intact, output about 20-25 cc's turbid, beige colored fluid  in bulb at present ; cx's - strept; blood cx's pend  Imaging: Ct Abdomen Pelvis W Contrast  02/14/2015   CLINICAL DATA:  Low abdominal pain,  drain placed for left lower quadrant diverticular abscess 02/09/2015  EXAM: CT ABDOMEN AND PELVIS WITH CONTRAST  TECHNIQUE: Multidetector CT imaging of the abdomen and pelvis was performed using the standard protocol following bolus administration of intravenous contrast.  CONTRAST:  131mL OMNIPAQUE IOHEXOL 300 MG/ML  SOLN  COMPARISON:  02/09/2015, 02/07/2015  FINDINGS: Lower chest: Dependent bibasilar atelectasis is slightly increased since previously. Trace pleural fluid is now identified. Heart size upper limits of normal with trace pericardial fluid.  Hepatobiliary: Liver and gallbladder are unremarkable.  Pancreas: Normal  Spleen: Normal   Adrenals/Urinary Tract: Adrenal glands and kidneys appear unremarkable.  Stomach/Bowel: Again noted is short segmental rectosigmoid colonic wall thickening with surrounding stranding and a few foci of adjacent gas, compatible with diverticulitis as seen on the prior exam. Within the left lower quadrant, there is a pigtail catheter in place with interval decrease in size of rim enhancing gas/ fluid collection compatible with improving diverticular abscess, now 2.8 x 2.3 cm image 66.  Vascular/Lymphatic: Moderate atheromatous aortic calcification noted without aneurysm. Scattered subcentimeter retroperitoneal nodes are reidentified, largest measuring 0.7 cm image 54.  Other: A small amount of pelvic free fluid is present. Presumed calcified uterine fibroid noted. Right ovary appears normal. Left ovary is not visualized.  Musculoskeletal: Disc degenerative change noted in the lower lumbar spine. No acute osseous abnormality.  IMPRESSION: Significant interval decrease in left lower quadrant diverticular abscess after drainage catheter placement. No new acute intra-abdominal or pelvic pathology.   Electronically Signed   By: Conchita Paris M.D.   On: 02/14/2015 13:59    Labs:  CBC:  Recent Labs  02/11/15 1105 02/12/15 0050 02/13/15 0427 02/14/15 0317  WBC 10.5 11.0* 8.7 8.7  HGB 12.7 12.7 12.2 12.2  HCT 38.2 39.0 37.0 38.6  PLT 289 299 279 301    COAGS:  Recent Labs  02/08/15 0821 02/09/15 0649 02/11/15 1105 02/14/15 0317  INR 1.48 1.38 1.80* 1.99*    BMP:  Recent Labs  02/08/15 0821 02/09/15 0649 02/11/15 1105 02/12/15 0050  NA 138 138 140 139  K 3.6 3.6 4.0 3.9  CL 103 104 102 107  CO2 23 25 29 27   GLUCOSE 86 117* 92 93  BUN 6 <5* <5* <5*  CALCIUM 8.8 8.8 8.8 8.5  CREATININE 0.74 0.73 0.77 0.77  GFRNONAA >90 >90 >90 >90  GFRAA >90 >90 >90 >90    LIVER FUNCTION TESTS:  Recent Labs  05/03/14 1015 02/06/15 1026 02/11/15 1105  BILITOT 0.3 1.5* 0.4  AST 14 20 17     ALT 12 16 11   ALKPHOS 62 72 54  PROT 7.4 7.2 6.2  ALBUMIN 4.0 3.1* 2.8*    Assessment and Plan: S/p LLQ divert abscess drainage 2/11; f/u CT today shows sig decrease in size of  LLQ abscess with no other acute processes; WBC nl/AF; plan d/w [facility] and they would like to cont drain for now and obtain f/u CT in 2 weeks at drain clinic [phone]); pt info given to clinic coordinator. As OP pt will need once daily irrigation of drain with 5 cc's sterile NS, recording of output and dressing changes every 1-2 days. If loose stools cont check c diff.   Signed: Autumn Messing 02/14/2015, 4:06 PM   I spent a total of 15 mins in face to face in clinical consultation/evaluation, greater than 50% of which was counseling/coordinating care for LLQ abscess drain

## 2015-02-14 NOTE — Clinical Documentation Improvement (Signed)
Current documentation states "Acute exacerbation of congestive heart failure".  Please provide the type of heart failure along with acuity in your progress notes and carry over to the discharge summary.  Thank you, Mateo Flow, RN [phone] Clinical Documentation Specialist

## 2015-02-14 NOTE — Progress Notes (Addendum)
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 Propoxyphene N-Acetaminophen Swelling  . Strawberry     Electric shock     Patient Measurements: Height: 5' 4.5" (163.8 cm) Weight: 188 lb 7.9 oz (85.5 kg) IBW/kg (Calculated) : 55.85 Heparin Dosing Weight: 73 kg  Vital Signs: Temp: 97.6 F (36.4 C) (02/16 0603) Temp Source: Oral (02/16 0603) BP: 138/102 mmHg (02/16 0921) Pulse Rate: 90 (02/16 0921)  Labs:  Recent Labs  02/12/15 0050  02/13/15 0427 02/14/15 0317 02/14/15 1123  HGB 12.7  --  12.2 12.2  --   HCT 39.0  --  37.0 38.6  --   PLT 299  --  279 301  --   LABPROT  --   --   --  22.8*  --   INR  --   --   --  1.99*  --   HEPARINUNFRC 0.17*  < > 0.47 0.14* 0.76*  CREATININE 0.77  --   --   --   --   < > = values in this interval not displayed.  Estimated Creatinine Clearance: 87.9 mL/min (by C-G formula based on Cr of 0.77).  Assessment: 54 year old F admitted on 02/11/2015 for continued treatment of diverticulitis complicated by abscess (JP drain in place). She is on chronic coumadin for history of DVT, which is now on hold, in case  Surgery is needed. IV heparin begun on 2/13.  Heparin level is just above target range (0.76) on 1600 units/hr.  Last heparin level was 0.17 on 1450 units/hr, after several therapeutic levels on that rate.  INR on admission (02/11/15) was subtherapeutic at 1.8. Up to 1.99 today. Possibly due to Cipro and Flagyl from 2/13-2/15, which can effect the INR.  Now on day # 2 Unasyn. For repeat CT today.  Home Coumadin dose: 7.5 mg daily except 11.25 mg MWF (based on coumadin clinic visit on 2/1 and verified with patient; last dose on 2/12).   Goal of Therapy:  Heparin level 0.3-0.7 units/ml Monitor platelets by anticoagulation protocol: Yes   Plan:    Decrease heparin drip to 1500 units/hr.   Next heparin level, CBC  in am.   Daily PT/INR.   Will follow up CT and +/- need for surgery.  Arty Baumgartner, [place] Pager: [phone] 02/14/2015 2:01 PM  Addum:  Resume coumadin.  Will give 7.5 mg today.  Check daily protimes. Excell Seltzer, PharmD

## 2015-02-14 NOTE — Progress Notes (Signed)
  PROGRESS NOTE MEDICINE TEACHING ATTENDING   Day 3 of stay Patient name: Alisha [name] record number: [id number] Date of birth: Feb 10, 1961   Alisha [name] is a 54 year old female with diverticular abscess admitted for the past 3 days. I am seeing her for the first time today.  Blood pressure 138/102, pulse 90, temperature 97.6 F (36.4 C), temperature source Oral, resp. rate 18, height 5' 4.5" (1.638 m), weight 188 lb 7.9 oz (85.5 kg), last menstrual period 01/22/2015, SpO2 92 %.  She is alert and oriented, sitting up in bed, with a very anxious and fidgety demeanor.  She has a soft abdomen with a drain coming off at the left side of the lower abdomen, draining purulent liquid. She is mildly tender in left lower quadrant, however states that she feels improved. She has regular cardiac rate and rhythm, and lungs clear to auscultation. She has no edema or tenderness in her extremities. She has good pedal pulses.   Diverticular abcess - She appears to be clinically improving. Her antibiotics have been changed to Unasyn given sensitivities. Given pus drainage, surgery recommends repeat CT abdomen pelvis with contrast which will be done today.    Medical non-compliance - Currently the patient wants to comply to everything.   Continue heparin - until CT report comes back and decision is made regarding medical verus surgical options.     I have discussed the care of this patient with my IM team residents.  Please see the resident note for details.  [place], [place] 02/14/2015, 12:13 PM.

## 2015-02-14 NOTE — Progress Notes (Signed)
ANTICOAGULATION CONSULT NOTE - Follow Up Consult  Pharmacy Consult for heparin Indication: h/o DVT  Labs:  Recent Labs  02/11/15 1105 02/12/15 0050  02/12/15 1510 02/13/15 0427 02/14/15 0317  HGB 12.7 12.7  --   --  12.2 12.2  HCT 38.2 39.0  --   --  37.0 38.6  PLT 289 299  --   --  279 301  LABPROT 21.1*  --   --   --   --  22.8*  INR 1.80*  --   --   --   --  1.99*  HEPARINUNFRC  --  0.17*  < > 0.46 0.47 0.14*  CREATININE 0.77 0.77  --   --   --   --   < > = values in this interval not displayed.    Assessment: 54yo female now subtherapeutic on heparin after two levels at goal, INR close to goal; no gtt issues per RN.  Goal of Therapy:  Heparin level 0.3-0.7 units/ml   Plan:  Will increase heparin gtt by 2 units/kg/hr to 1600 units/hr and check level in 6hr.  Wynona Neat, PharmD, BCPS  02/14/2015,5:15 AM

## 2015-02-14 NOTE — Progress Notes (Signed)
Patient ID: Alisha [name], female   DOB: September 21, 1961, 54 y.o.   MRN: [id number]    Subjective: Pt feels better today, but still having pains.  Objective: Vital signs in last 24 hours: Temp:  [97.4 F (36.3 C)-98.3 F (36.8 C)] 97.6 F (36.4 C) (02/16 0603) Pulse Rate:  [76-90] 90 (02/16 0921) Resp:  [16-18] 18 (02/16 0603) BP: (138-182)/(96-105) 138/102 mmHg (02/16 0921) SpO2:  [92 %-99 %] 92 % (02/16 0603) Weight:  [188 lb 7.9 oz (85.5 kg)] 188 lb 7.9 oz (85.5 kg) (02/16 0532) Last BM Date: 02/13/15  Intake/Output from previous day: 02/15 0701 - 02/16 0700 In: 3089.1 [P.O.:2340; I.V.:349.1; IV Piggyback:400] Out: 860 [Urine:850; Drains:10] Intake/Output this shift: Total I/O In: 67 [P.O.:960] Out: -   PE: Abd: soft, still mildly tender in LLQ, drain still purulent. +BS  Lab Results:   Recent Labs  02/13/15 0427 02/14/15 0317  WBC 8.7 8.7  HGB 12.2 12.2  HCT 37.0 38.6  PLT 279 301   BMET  Recent Labs  02/11/15 1105 02/12/15 0050  NA 140 139  K 4.0 3.9  CL 102 107  CO2 29 27  GLUCOSE 92 93  BUN <5* <5*  CREATININE 0.77 0.77  CALCIUM 8.8 8.5   PT/INR  Recent Labs  02/11/15 1105 02/14/15 0317  LABPROT 21.1* 22.8*  INR 1.80* 1.99*   CMP     Component Value Date/Time   NA 139 02/12/2015 0050   K 3.9 02/12/2015 0050   CL 107 02/12/2015 0050   CO2 27 02/12/2015 0050   GLUCOSE 93 02/12/2015 0050   BUN <5* 02/12/2015 0050   CREATININE 0.77 02/12/2015 0050   CREATININE 0.83 07/16/2013 1105   CALCIUM 8.5 02/12/2015 0050   PROT 6.2 02/11/2015 1105   ALBUMIN 2.8* 02/11/2015 1105   AST 17 02/11/2015 1105   ALT 11 02/11/2015 1105   ALKPHOS 54 02/11/2015 1105   BILITOT 0.4 02/11/2015 1105   GFRNONAA >90 02/12/2015 0050   GFRNONAA >60 07/29/2011 1220   GFRAA >90 02/12/2015 0050   GFRAA >60 07/29/2011 1220   Lipase     Component Value Date/Time   LIPASE 21 02/06/2015 1026       Studies/Results: No results  found.  Anti-infectives: Anti-infectives    Start     Dose/Rate Route Frequency Ordered Stop   02/13/15 1200  Ampicillin-Sulbactam (UNASYN) 3 g in sodium chloride 0.9 % 100 mL IVPB     3 g 100 mL/hr over 60 Minutes Intravenous Every 6 hours 02/13/15 1051     02/11/15 2215  ciprofloxacin (CIPRO) tablet 500 mg  Status:  Discontinued     500 mg Oral 2 times daily 02/11/15 2207 02/13/15 1046   02/11/15 1000  ciprofloxacin (CIPRO) IVPB 400 mg  Status:  Discontinued     400 mg 200 mL/hr over 60 Minutes Intravenous Every 12 hours 02/11/15 0937 02/11/15 2207   02/11/15 0915  metroNIDAZOLE (FLAGYL) IVPB 500 mg  Status:  Discontinued     500 mg 100 mL/hr over 60 Minutes Intravenous Every 8 hours 02/11/15 0909 02/13/15 1046       Assessment/Plan   1. Diverticulitis with abscess, s/p perc drain 2. Medical noncompliance 3. 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 Repeat CT scan today.  Patient feels better today, but still complains of pain and moans when trying to get into bed secondary to pain.  A little concerned that her drainage is still purulent and hasn't started to clear up  yet.  Will make further recommendations for her care after her CT scan.  Spoke to primary service about this as well.  LOS: 3 days    Alexsys Eskin E 02/14/2015, 10:05 AM Pager: [phone]

## 2015-02-14 NOTE — Progress Notes (Signed)
INTERNAL MEDICINE TEACHING ATTENDING ADDENDUM - Aldine Contes, MD: I personally saw and evaluated Ms. Kunz in this clinic visit in conjunction with the resident, Dr. Eula Fried. I have discussed patient's plan of care with medical resident during this visit. I have confirmed the physical exam findings and have read and agree with the clinic note including the plan with the following addition: - Patient seen and examined. States abd pain has improved - No abd tenderness on palpation. Drain intact and draining purulent material - Patient taking cipro and flagyl PO. Refused admission but will return for admission

## 2015-02-15 ENCOUNTER — Encounter (HOSPITAL_COMMUNITY): Payer: Self-pay

## 2015-02-15 DIAGNOSIS — R197 Diarrhea, unspecified: Secondary | ICD-10-CM

## 2015-02-15 LAB — CBC
HEMATOCRIT: 39.7 % (ref 36.0–46.0)
HEMATOCRIT: 37.7 % (ref 36.0–46.0)
HEMOGLOBIN: 12.7 g/dL (ref 12.0–15.0)
HEMOGLOBIN: 12 g/dL (ref 12.0–15.0)
MCH: 31.4 pg (ref 26.0–34.0)
MCH: 31.1 pg (ref 26.0–34.0)
MCHC: 32 g/dL (ref 30.0–36.0)
MCHC: 31.8 g/dL (ref 30.0–36.0)
MCV: 98.3 fL (ref 78.0–100.0)
MCV: 97.7 fL (ref 78.0–100.0)
Platelets: 360 10*3/uL (ref 150–400)
Platelets: 327 10*3/uL (ref 150–400)
RBC: 4.04 MIL/uL (ref 3.87–5.11)
RBC: 3.86 MIL/uL — ABNORMAL LOW (ref 3.87–5.11)
RDW: 13.3 % (ref 11.5–15.5)
RDW: 13.3 % (ref 11.5–15.5)
WBC: 12.7 10*3/uL — ABNORMAL HIGH (ref 4.0–10.5)
WBC: 10.7 10*3/uL — ABNORMAL HIGH (ref 4.0–10.5)

## 2015-02-15 LAB — CLOSTRIDIUM DIFFICILE BY PCR: CDIFFPCR: NEGATIVE

## 2015-02-15 LAB — PROTIME-INR
INR: 1.39 (ref 0.00–1.49)
Prothrombin Time: 17.2 seconds — ABNORMAL HIGH (ref 11.6–15.2)

## 2015-02-15 LAB — HEPARIN LEVEL (UNFRACTIONATED): HEPARIN UNFRACTIONATED: 0.69 [IU]/mL (ref 0.30–0.70)

## 2015-02-15 MED ORDER — TRAMADOL HCL 50 MG PO TABS: 50.0000 mg | ORAL_TABLET | Freq: Four times a day (QID) | ORAL | Status: DC | PRN

## 2015-02-15 MED ORDER — HYDROCHLOROTHIAZIDE 25 MG PO TABS
12.5000 mg | ORAL_TABLET | Freq: Every day | ORAL | Status: DC
  Administered 2015-02-15: 12.5 mg via ORAL
  Filled 2015-02-15: qty 0.5

## 2015-02-15 MED ORDER — AMOXICILLIN-POT CLAVULANATE 875-125 MG PO TABS: 1.0000 | ORAL_TABLET | Freq: Two times a day (BID) | ORAL | Status: DC

## 2015-02-15 NOTE — Progress Notes (Signed)
Patient ID: Alisha [name], female   DOB: 1961/08/14, 54 y.o.   MRN: [id number]    Subjective: Pt feels well.  Tolerating low fiber diet.  Wants to go home.  Had several loose BMs yesterday after CT contrast given  Objective: Vital signs in last 24 hours: Temp:  [98.1 F (36.7 C)-98.3 F (36.8 C)] 98.2 F (36.8 C) (02/17 0432) Pulse Rate:  [87-94] 94 (02/17 0432) Resp:  [18] 18 (02/17 0432) BP: (115-147)/(87-102) 143/97 mmHg (02/17 0432) SpO2:  [98 %-100 %] 98 % (02/17 0432) Weight:  [185 lb (83.915 kg)] 185 lb (83.915 kg) (02/17 0432) Last BM Date: 02/14/15  Intake/Output from previous day: 02/16 0701 - 02/17 0700 In: 2827 [P.O.:2040; I.V.:367; IV Piggyback:400] Out: 500 [Urine:500] Intake/Output this shift:    PE: Abd: soft, less tender, drain still with some purulent output, +BS, obese  Lab Results:   Recent Labs  02/14/15 0317 02/15/15 0435  WBC 8.7 12.7*  HGB 12.2 12.7  HCT 38.6 39.7  PLT 301 360   BMET No results for input(s): NA, K, CL, CO2, GLUCOSE, BUN, CREATININE, CALCIUM in the last 72 hours. PT/INR  Recent Labs  02/14/15 0317 02/15/15 0435  LABPROT 22.8* 17.2*  INR 1.99* 1.39   CMP     Component Value Date/Time   NA 139 02/12/2015 0050   K 3.9 02/12/2015 0050   CL 107 02/12/2015 0050   CO2 27 02/12/2015 0050   GLUCOSE 93 02/12/2015 0050   BUN <5* 02/12/2015 0050   CREATININE 0.77 02/12/2015 0050   CREATININE 0.83 07/16/2013 1105   CALCIUM 8.5 02/12/2015 0050   PROT 6.2 02/11/2015 1105   ALBUMIN 2.8* 02/11/2015 1105   AST 17 02/11/2015 1105   ALT 11 02/11/2015 1105   ALKPHOS 54 02/11/2015 1105   BILITOT 0.4 02/11/2015 1105   GFRNONAA >90 02/12/2015 0050   GFRNONAA >60 07/29/2011 1220   GFRAA >90 02/12/2015 0050   GFRAA >60 07/29/2011 1220   Lipase     Component Value Date/Time   LIPASE 21 02/06/2015 1026       Studies/Results: Ct Abdomen Pelvis W Contrast  02/14/2015   CLINICAL DATA:  Low abdominal pain, drain placed for left  lower quadrant diverticular abscess 02/09/2015  EXAM: CT ABDOMEN AND PELVIS WITH CONTRAST  TECHNIQUE: Multidetector CT imaging of the abdomen and pelvis was performed using the standard protocol following bolus administration of intravenous contrast.  CONTRAST:  166mL OMNIPAQUE IOHEXOL 300 MG/ML  SOLN  COMPARISON:  02/09/2015, 02/07/2015  FINDINGS: Lower chest: Dependent bibasilar atelectasis is slightly increased since previously. Trace pleural fluid is now identified. Heart size upper limits of normal with trace pericardial fluid.  Hepatobiliary: Liver and gallbladder are unremarkable.  Pancreas: Normal  Spleen: Normal  Adrenals/Urinary Tract: Adrenal glands and kidneys appear unremarkable.  Stomach/Bowel: Again noted is short segmental rectosigmoid colonic wall thickening with surrounding stranding and a few foci of adjacent gas, compatible with diverticulitis as seen on the prior exam. Within the left lower quadrant, there is a pigtail catheter in place with interval decrease in size of rim enhancing gas/ fluid collection compatible with improving diverticular abscess, now 2.8 x 2.3 cm image 66.  Vascular/Lymphatic: Moderate atheromatous aortic calcification noted without aneurysm. Scattered subcentimeter retroperitoneal nodes are reidentified, largest measuring 0.7 cm image 54.  Other: A small amount of pelvic free fluid is present. Presumed calcified uterine fibroid noted. Right ovary appears normal. Left ovary is not visualized.  Musculoskeletal: Disc degenerative change noted in the lower  lumbar spine. No acute osseous abnormality.  IMPRESSION: Significant interval decrease in left lower quadrant diverticular abscess after drainage catheter placement. No new acute intra-abdominal or pelvic pathology.   Electronically Signed   By: Conchita Paris M.D.   On: 02/14/2015 13:59    Anti-infectives: Anti-infectives    Start     Dose/Rate Route Frequency Ordered Stop   02/13/15 1200  Ampicillin-Sulbactam  (UNASYN) 3 g in sodium chloride 0.9 % 100 mL IVPB     3 g 100 mL/hr over 60 Minutes Intravenous Every 6 hours 02/13/15 1051     02/11/15 2215  ciprofloxacin (CIPRO) tablet 500 mg  Status:  Discontinued     500 mg Oral 2 times daily 02/11/15 2207 02/13/15 1046   02/11/15 1000  ciprofloxacin (CIPRO) IVPB 400 mg  Status:  Discontinued     400 mg 200 mL/hr over 60 Minutes Intravenous Every 12 hours 02/11/15 0937 02/11/15 2207   02/11/15 0915  metroNIDAZOLE (FLAGYL) IVPB 500 mg  Status:  Discontinued     500 mg 100 mL/hr over 60 Minutes Intravenous Every 8 hours 02/11/15 0909 02/13/15 1046       Assessment/Plan  1. Diverticulitis with abscess, s/p perc drain  Plan: 1. Patient is surgically stable for dc home with her drain in place.  She still has a residual abscess left, but with significant decrease in size from her last scan.  She will need to be on at least 2 additional weeks of oral abx therapy at home. Cipro/flagyl or augmentin is fine. 2. She will follow up with the IR drain clinic in about 2 weeks for a repeat CT scan etc.  She will then follow up with Dr. Brantley Stage after that.  3. I suspect her multiple BMs is from her CT contrast.  c diff pending.  LOS: 4 days    [name] 02/15/2015, 8:29 AM Pager: [phone]

## 2015-02-15 NOTE — Progress Notes (Signed)
Subjective: [name]. Alisha [name] says her abdominal pain is better. She is tolerating her diet well with no nausea, emesis. She had several loose bowel movements following the CT scan. Her L arm pain has resolved. She is eager for discharge.   Objective: Vital signs in last 24 hours: Filed Vitals:   02/14/15 0921 02/14/15 1635 02/14/15 1926 02/15/15 0432  BP: 138/102 115/89 147/87 143/97  Pulse: 90 87 87 94  Temp:  98.3 F (36.8 C) 98.1 F (36.7 C) 98.2 F (36.8 C)  TempSrc:  Oral Oral Oral  Resp:  18 18 18   Height:      Weight:    185 lb (83.915 kg)  SpO2:  100% 100% 98%   Weight change: -3 lb 7.9 oz (-1.584 kg)  Intake/Output Summary (Last 24 hours) at 02/15/15 4097 Last data filed at 02/15/15 0600  Gross per 24 hour  Intake 2466.95 ml  Output    500 ml  Net 1966.95 ml   Gen: A&O x 4, no acute distress laying in bed, well developed, well nourished HEENT: Atraumatic, PERRL, EOMI, sclerae anicteric, moist mucous membranes Heart: Regular rate and rhythm, normal S1 S2, no murmurs, rubs, or gallops Lungs: Clear to auscultation bilaterally, respirations unlabored Abd: Soft, minimal tenderness on LLQ, IR drain with minimal amount of opaque serosanguinous output, non-distended, + bowel sounds, no hepatosplenomegaly Ext: L arm non-tender and minimally swollen, no other edema or cyanosis  Lab Results: Basic Metabolic Panel:  Recent Labs Lab 02/11/15 1105 02/12/15 0050  NA 140 139  K 4.0 3.9  CL 102 107  CO2 29 27  GLUCOSE 92 93  BUN <5* <5*  CREATININE 0.77 0.77  CALCIUM 8.8 8.5   Liver Function Tests:  Recent Labs Lab 02/11/15 1105  AST 17  ALT 11  ALKPHOS 54  BILITOT 0.4  PROT 6.2  ALBUMIN 2.8*  CBC:  Recent Labs Lab 02/14/15 0317 02/15/15 0435  WBC 8.7 12.7*  HGB 12.2 12.7  HCT 38.6 39.7  MCV 99.0 98.3  PLT 301 360   Coagulation:  Recent Labs Lab 02/09/15 0649 02/11/15 1105 02/14/15 0317 02/15/15 0435  LABPROT 17.1* 21.1* 22.8* 17.2*  INR  1.38 1.80* 1.99* 1.39   Micro Results: Recent Results (from the past 240 hour(s))  Culture, Urine     Status: None   Collection Time: 02/06/15 11:57 AM  Result Value Ref Range Status   Specimen Description URINE, CLEAN CATCH  Final   Special Requests ADDED 2243  Final   Colony Count   Final    >=100,000 COLONIES/ML Performed at [facility]    Culture   Final    Multiple bacterial morphotypes present, none predominant. Suggest appropriate recollection if clinically indicated. Performed at [facility]    Report Status 02/08/2015 FINAL  Final  Culture, blood (routine x 2)     Status: None   Collection Time: 02/08/15  8:36 AM  Result Value Ref Range Status   Specimen Description BLOOD RIGHT HAND  Final   Special Requests BOTTLES DRAWN AEROBIC ONLY 5CC  Final   Culture   Final    NO GROWTH 5 DAYS Performed at [facility]    Report Status 02/14/2015 FINAL  Final  Culture, blood (routine x 2)     Status: None   Collection Time: 02/08/15  8:48 AM  Result Value Ref Range Status   Specimen Description BLOOD LEFT HAND  Final   Special Requests BOTTLES DRAWN AEROBIC ONLY 5CC  Final   Culture   Final    NO GROWTH 5 DAYS Performed at [facility]    Report Status 02/14/2015 FINAL  Final  Culture, routine-abscess     Status: None   Collection Time: 02/09/15 10:38 AM  Result Value Ref Range Status   Specimen Description ABSCESS ABDOMEN  Final   Special Requests DRAIN FROM LOWER ABD  Final   Gram Stain   Final    ABUNDANT WBC PRESENT, PREDOMINANTLY PMN NO SQUAMOUS EPITHELIAL CELLS SEEN ABUNDANT GRAM POSITIVE COCCI IN PAIRS ABUNDANT GRAM NEGATIVE RODS Performed at [facility]    Culture   Final    ABUNDANT MICROAEROPHILIC STREPTOCOCCI Note: Standardized susceptibility testing for this organism is not available. Performed at [facility]    Report Status 02/12/2015 FINAL  Final  Anaerobic culture     Status: None (Preliminary  result)   Collection Time: 02/09/15 10:38 AM  Result Value Ref Range Status   Specimen Description ABSCESS ABDOMEN  Final   Special Requests DRAIN FROM LOWER ABD  Final   Gram Stain PENDING  Incomplete   Culture   Final    NO ANAEROBES ISOLATED; CULTURE IN PROGRESS FOR 5 DAYS Performed at [facility]    Report Status PENDING  Incomplete  Culture, blood (routine x 2)     Status: None (Preliminary result)   Collection Time: 02/11/15 11:05 AM  Result Value Ref Range Status   Specimen Description BLOOD RIGHT ARM  Final   Special Requests BOTTLES DRAWN AEROBIC ONLY 6CC  Final   Culture   Final           BLOOD CULTURE RECEIVED NO GROWTH TO DATE CULTURE WILL BE HELD FOR 5 DAYS BEFORE ISSUING A FINAL NEGATIVE REPORT Performed at [facility]    Report Status PENDING  Incomplete  Culture, blood (routine x 2)     Status: None (Preliminary result)   Collection Time: 02/11/15 11:11 AM  Result Value Ref Range Status   Specimen Description BLOOD RIGHT HAND  Final   Special Requests BOTTLES DRAWN AEROBIC AND ANAEROBIC 10CC  Final   Culture   Final           BLOOD CULTURE RECEIVED NO GROWTH TO DATE CULTURE WILL BE HELD FOR 5 DAYS BEFORE ISSUING A FINAL NEGATIVE REPORT Note: Culture results may be compromised due to an excessive volume of blood received in culture bottles. Performed at [facility]    Report Status PENDING  Incomplete   Left upper extremity doppler: findings consistent with acute superficial vein thrombosis involving the left cephalic vein in the forearm only.  Studies/Results: Ct Abdomen Pelvis W Contrast  02/14/2015   CLINICAL DATA:  Low abdominal pain, drain placed for left lower quadrant diverticular abscess 02/09/2015  EXAM: CT ABDOMEN AND PELVIS WITH CONTRAST  TECHNIQUE: Multidetector CT imaging of the abdomen and pelvis was performed using the standard protocol following bolus administration of intravenous contrast.  CONTRAST:  126mL OMNIPAQUE  IOHEXOL 300 MG/ML  SOLN  COMPARISON:  02/09/2015, 02/07/2015  FINDINGS: Lower chest: Dependent bibasilar atelectasis is slightly increased since previously. Trace pleural fluid is now identified. Heart size upper limits of normal with trace pericardial fluid.  Hepatobiliary: Liver and gallbladder are unremarkable.  Pancreas: Normal  Spleen: Normal  Adrenals/Urinary Tract: Adrenal glands and kidneys appear unremarkable.  Stomach/Bowel: Again noted is short segmental rectosigmoid colonic wall thickening with surrounding stranding and a few foci of adjacent gas, compatible with diverticulitis as  seen on the prior exam. Within the left lower quadrant, there is a pigtail catheter in place with interval decrease in size of rim enhancing gas/ fluid collection compatible with improving diverticular abscess, now 2.8 x 2.3 cm image 66.  Vascular/Lymphatic: Moderate atheromatous aortic calcification noted without aneurysm. Scattered subcentimeter retroperitoneal nodes are reidentified, largest measuring 0.7 cm image 54.  Other: A small amount of pelvic free fluid is present. Presumed calcified uterine fibroid noted. Right ovary appears normal. Left ovary is not visualized.  Musculoskeletal: Disc degenerative change noted in the lower lumbar spine. No acute osseous abnormality.  IMPRESSION: Significant interval decrease in left lower quadrant diverticular abscess after drainage catheter placement. No new acute intra-abdominal or pelvic pathology.   Electronically Signed   By: Conchita Paris M.D.   On: 02/14/2015 13:59   Medications: I have reviewed the patient's current medications. Scheduled Meds: . ampicillin-sulbactam (UNASYN) IV  3 g Intravenous Q6H  . aspirin EC  81 mg Oral Daily  . atorvastatin  40 mg Oral QHS  . buPROPion  300 mg Oral Daily  . carvedilol  25 mg Oral BID WC  . diazepam  10 mg Oral TID  . DULoxetine  60 mg Oral Daily  . gabapentin  1,600 mg Oral QHS  . gabapentin  800 mg Oral BID WC  .  hydrochlorothiazide  12.5 mg Oral Daily  . lisinopril  20 mg Oral BID  . Warfarin - Pharmacist Dosing Inpatient   Does not apply q1800  . ziprasidone  60 mg Oral BID WC   Continuous Infusions: . heparin 1,500 Units/hr (02/15/15 0642)   PRN Meds:.albuterol, hydrALAZINE, hydrOXYzine, traMADol Assessment/Plan: Principal Problem:   Diverticulitis of intestine with abscess Active Problems:   Depression   Long term current use of anticoagulant   Anxiety   HTN (hypertension)   Colonic diverticular abscess   Superficial venous thrombosis of arm  #Diverticulitis with Abscess: Alisha [name] symptoms have continued to improve and she has tolerated advancement of her diet.  She has sigmoid diverticulitis and CT w oral contrast demonstrated adjacent 7.8 x 4.6 cm complex fluid collection consistent with diverticular abscess. Repeat CT 2/16 with significant interval decrease in abscess. She remains afebrile but WBC slightly elevated today at 12.7 from 8.7 yesterday. She has been cleared by general surgery and IR for discharge. They will arrange follow-up in their clinics as well as repeat CT imaging in about two weeks. IR recommends daily irrigation of drain with 5 cc's sterile normal saline. She will have two additional weeks of oral antibiotics. Her abscess culture grew microaerophilic streptococci. 12.She had successful placement of an IR drain with no complications the morning of 02/09/15 but left later that day AMA. IR saw the patient on 2/14 and recommend continued NS irrigation of the drain. Abscess sample shows microaerophilic streptococci. -repeat CBC -appreciate gen surg, IR -repeat CT abdomen in 2 weeks with IR, gen surg f/u arranged by their clinics -unasyn per pharmacy as growing streptococci -2 weeks augmentin at discharge -tramadol 50 mg q6hprn -home health RN for daily 5 cc sterile normal saline irrigation of diverticular drain  #Diarrhea: Alisha [name] reports several loose bowel movements  following oral contrast administration. This was likely trigger but she also has elevated CBC and known diverticulitis. C diff lab also sent -f/u c diff  #L arm phlebitis: Alisha [name]'s L arm acute superficial vein thrombosis of L cephalic vein noted on doppler at site of previous iv on last admission is nearly back to  baseline with compression and elevation. Still some minimal swelling. 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 She was subtherapeutic with INR 1.21 on previous presentation and started on a heparin bridge. Her INR on return this admission was 1.80. We held her warfarin until yesterday to be sure surgery was not planning any procedure pending CT results. INR this morning 1.34 -heparin, warfarin per pharmacy  #HTN: BP 140s/80s-90s overnight.  At home she is on ASA 81 mg daily, carvedilol 25 mg bid, lisinopril 20 mg bid, HCTZ 12.5 mg daily, atorvastatin 40 mg daily. Lisinopril and HCTZ were held prior to CT contrast but lisinopril added yesterday -cont ASA 81 mg daily, carvedilol 25 mg bid, lisinopril 20 mg bid, atorvastatin 40 mg daily -resume HCTZ 12.5 mg daily  #CHF: Non-contributory. Echo 2007 45% EF per EMR. At home she is on ASA 81 mg daily, carvedilol 25 mg bid, lisinopril 20 mg bid, KDur 10 mEq. -cont ASA 81 mg daily, carvedilol 25 mg bid lisinopril 20 mg bid -resume HCTZ 12.5 mg daily  #CAD: Non-contributory. 02/10/14 stress test low risk but anterior abn, Cath on 02/25/14 nonobstructive CAD, normal LV function 55-65%.  At home she is on ASA 81 mg daily, carvedilol 25 mg bid, lisinopril 20 mg bid, atorvastatin 40 mg daily. -cont ASA 81 mg daily, carvedilol 25 mg bid, atorvastatin 40 mg daily, lisinopril 20 mg bid  #HLD: Last lipid profile 10/01/2011 w cholesterol 162, triglyceride 205, HDL 25, LDL 96. At home she is on atorvastatin 40 mg daily. -cont atorvastatin 40 mg daily.  #Diet: soft  #DVT  PPx: heparin,warfarin per pharmacy  #Code: Full  Dispo: Disposition is deferred at this time, awaiting improvement of current medical problems.  Anticipated discharge in approximately today.   The patient does have a current PCP Julious Oka, MD) and does need an [facility] hospital follow-up appointment after discharge.  The patient does not know have transportation limitations that hinder transportation to clinic appointments.  .Services Needed at time of discharge: Y = Yes, Blank = No PT:   OT:   RN: Home health  Equipment:   Other:     LOS: 4 days   Kelby Aline, MD 02/15/2015, 9:22 AM

## 2015-02-15 NOTE — Progress Notes (Signed)
  PROGRESS NOTE MEDICINE TEACHING ATTENDING   Day 4 of stay Patient name: [place] record number: [id number] Date of birth: Jun 09, 1961   Patient clinically improving, reports less pain in the abdomen, moving freely around the room.  Had a bout of diarrhea yesterday after contrast intake, no more. She has no complaints today.   Blood pressure 143/97, pulse 94, temperature 98.2 F (36.8 C), temperature source Oral,  Resp. rate 18, height 5' 4.5" (1.638 m), Weight 185 lb (83.915 kg),  Last menstrual period 01/22/2015, SpO2 98 %.  Alert and oriented, no acute distress, no tachycardia.  Abdomen soft, mild LLQ tenderness, drain out with some serosanguinous drainage.   Agree with discharge home today. Patient will be discharged on Augmentin for 2 weeks.  One time spike in WBC to 12.7 which trended down to 10.7 on repeat check.  Would be following up with IR drain clinic in 2 weeks and outpatient IM clinic within 1 week.   I have discussed the care of this patient with my IM team residents.  Please see the resident Dr Bebe Shaggy note for details.  [place], [place] 02/15/2015, 11:17 AM.

## 2015-02-15 NOTE — Discharge Instructions (Signed)
It was a pleasure to care for you at [facility]. We have started a new medication called augmentin to be taken for your diverticulitis. Please take for two weeks. You will be contacted by general surgery and interventional radiology for follow-up appointments with them as well. We have ordered home health nursing for daily drain cleans with saline solution. Please return to the Emergency Department or seek medical attention if you have any new or worsening fever, abdominal pain, or other worrisome medical condition.   [name], MD       Diverticulitis Diverticulitis is inflammation or infection of small pouches in your colon that form when you have a condition called diverticulosis. The pouches in your colon are called diverticula. Your colon, or large intestine, is where water is absorbed and stool is formed. Complications of diverticulitis can include:  Bleeding.  Severe infection.  Severe pain.  Perforation of your colon.  Obstruction of your colon. CAUSES  Diverticulitis is caused by bacteria. Diverticulitis happens when stool becomes trapped in diverticula. This allows bacteria to grow in the diverticula, which can lead to inflammation and infection. RISK FACTORS People with diverticulosis are at risk for diverticulitis. Eating a diet that does not include enough fiber from fruits and vegetables may make diverticulitis more likely to develop. SYMPTOMS  Symptoms of diverticulitis may include:  Abdominal pain and tenderness. The pain is normally located on the left side of the abdomen, but may occur in other areas.  Fever and chills.  Bloating.  Cramping.  Nausea.  Vomiting.  Constipation.  Diarrhea.  Blood in your stool. DIAGNOSIS  Your health care provider will ask you about your medical history and do a physical exam. You may need to have tests done because many medical conditions can cause the same symptoms as diverticulitis. Tests may include:  Blood  tests.  Urine tests.  Imaging tests of the abdomen, including X-rays and CT scans. When your condition is under control, your health care provider may recommend that you have a colonoscopy. A colonoscopy can show how severe your diverticula are and whether something else is causing your symptoms. TREATMENT  Most cases of diverticulitis are mild and can be treated at home. Treatment may include:  Taking over-the-counter pain medicines.  Following a clear liquid diet.  Taking antibiotic medicines by mouth for 7-10 days. More severe cases may be treated at a hospital. Treatment may include:  Not eating or drinking.  Taking prescription pain medicine.  Receiving antibiotic medicines through an IV tube.  Receiving fluids and nutrition through an IV tube.  Surgery. HOME CARE INSTRUCTIONS   Follow your health care provider's instructions carefully.  Follow a full liquid diet or other diet as directed by your health care provider. After your symptoms improve, your health care provider may tell you to change your diet. He or she may recommend you eat a high-fiber diet. Fruits and vegetables are good sources of fiber. Fiber makes it easier to pass stool.  Take fiber supplements or probiotics as directed by your health care provider.  Only take medicines as directed by your health care provider.  Keep all your follow-up appointments. SEEK MEDICAL CARE IF:   Your pain does not improve.  You have a hard time eating food.  Your bowel movements do not return to normal. SEEK IMMEDIATE MEDICAL CARE IF:   Your pain becomes worse.  Your symptoms do not get better.  Your symptoms suddenly get worse.  You have a fever.  You have  repeated vomiting.  You have bloody or black, tarry stools. MAKE SURE YOU:   Understand these instructions.  Will watch your condition.  Will get help right away if you are not doing well or get worse. Document Released: 09/25/2005 Document Revised:  12/21/2013 Document Reviewed: 11/10/2013 [facility] Patient Information 2015 [place], [place]. This information is not intended to replace advice given to you by your health care provider. Make sure you discuss any questions you have with your health care provider.  Low-Fiber Diet x 3 weeks Fiber is found in fruits, vegetables, and whole grains. A low-fiber diet restricts fibrous foods that are not digested in the small intestine. A diet containing about 10-15 grams of fiber per day is considered low fiber. Low-fiber diets may be used to:  Promote healing and rest the bowel during intestinal flare-ups.  Prevent blockage of a partially obstructed or narrowed gastrointestinal tract.  Reduce fecal weight and volume.  Slow the movement of feces. You may be on a low-fiber diet as a transitional diet following surgery, after an injury (trauma), or because of a short (acute) or lifelong (chronic) illness. Your health care provider will determine the length of time you need to stay on this diet.  WHAT DO I NEED TO KNOW ABOUT A LOW-FIBER DIET? Always check the fiber content on the packaging's Nutrition Facts label, especially on foods from the grains list. Ask your dietitian if you have questions about specific foods that are related to your condition, especially if the food is not listed below. In general, a low-fiber food will have less than 2 g of fiber. WHAT FOODS CAN I EAT? Grains All breads and crackers made with white flour. Sweet rolls, doughnuts, waffles, pancakes, [place] toast, bagels. Pretzels, Melba toast, zwieback. Well-cooked cereals, such as cornmeal, farina, or cream cereals. Dry cereals that do not contain whole grains, fruit, or nuts, such as refined corn, wheat, rice, and oat cereals. Potatoes prepared any way without skins, plain pastas and noodles, refined white rice. Use white flour for baking and making sauces. Use allowed list of grains for casseroles, dumplings, and puddings.    Vegetables Strained tomato and vegetable juices. Fresh lettuce, cucumber, spinach. Well-cooked (no skin or pulp) or canned vegetables, such as asparagus, bean sprouts, beets, carrots, green beans, mushrooms, potatoes, pumpkin, spinach, yellow squash, tomato sauce/puree, turnips, yams, and zucchini. Keep servings limited to  cup.  Fruits All fruit juices except prune juice. Cooked or canned fruits without skin and seeds, such as applesauce, apricots, cherries, fruit cocktail, grapefruit, grapes, mandarin oranges, melons, peaches, pears, pineapple, and plums. Fresh fruits without skin, such as apricots, avocados, bananas, melons, pineapple, nectarines, and peaches. Keep servings limited to  cup or 1 piece.  Meat and Other Protein Sources Ground or well-cooked tender beef, ham, veal, lamb, pork, or poultry. Eggs, plain cheese. Fish, oysters, shrimp, lobster, and other seafood. Liver, organ meats. Smooth nut butters. Dairy All milk products and alternative dairy substitutes, such as soy, rice, almond, and coconut, not containing added whole nuts, seeds, or added fruit. Beverages Decaf coffee, fruit, and vegetable juices or smoothies (small amounts, with no pulp or skins, and with fruits from allowed list), sports drinks, herbal tea. Condiments Ketchup, mustard, vinegar, cream sauce, cheese sauce, cocoa powder. Spices in moderation, such as allspice, basil, bay leaves, celery powder or leaves, cinnamon, cumin powder, curry powder, ginger, mace, marjoram, onion or garlic powder, oregano, paprika, parsley flakes, ground pepper, rosemary, sage, savory, tarragon, thyme, and turmeric. Sweets and Desserts Plain  cakes and cookies, pie made with allowed fruit, pudding, custard, cream pie. Gelatin, fruit, ice, sherbet, frozen ice pops. Ice cream, ice milk without nuts. Plain hard candy, honey, jelly, molasses, syrup, sugar, chocolate syrup, gumdrops, marshmallows. Limit overall sugar intake.  Fats and  Oil Margarine, butter, cream, mayonnaise, salad oils, plain salad dressings made from allowed foods. Choose healthy fats such as olive oil, canola oil, and omega-3 fatty acids (such as found in salmon or tuna) when possible.  Other Bouillon, broth, or cream soups made from allowed foods. Any strained soup. Casseroles or mixed dishes made with allowed foods. The items listed above may not be a complete list of recommended foods or beverages. Contact your dietitian for more options.  WHAT FOODS ARE NOT RECOMMENDED? Grains All whole wheat and whole grain breads and crackers. Multigrains, rye, bran seeds, nuts, or coconut. Cereals containing whole grains, multigrains, bran, coconut, nuts, raisins. Cooked or dry oatmeal, steel-cut oats. Coarse wheat cereals, granola. Cereals advertised as high fiber. Potato skins. Whole grain pasta, wild or brown rice. Popcorn. Coconut flour. Bran, buckwheat, corn bread, multigrains, rye, wheat germ.  Vegetables Fresh, cooked or canned vegetables, such as artichokes, asparagus, beet greens, broccoli, Brussels sprouts, cabbage, celery, cauliflower, corn, eggplant, kale, legumes or beans, okra, peas, and tomatoes. Avoid large servings of any vegetables, especially raw vegetables.  Fruits Fresh fruits, such as apples with or without skin, berries, cherries, figs, grapes, grapefruit, guavas, kiwis, mangoes, oranges, papayas, pears, persimmons, pineapple, and pomegranate. Prune juice and juices with pulp, stewed or dried prunes. Dried fruits, dates, raisins. Fruit seeds or skins. Avoid large servings of all fresh fruits. Meats and Other Protein Sources Tough, fibrous meats with gristle. Chunky nut butter. Cheese made with seeds, nuts, or other foods not recommended. Nuts, seeds, legumes (beans, including baked beans), dried peas, beans, lentils.  Dairy Yogurt or cheese that contains nuts, seeds, or added fruit.  Beverages Fruit juices with high pulp, prune juice. Caffeinated  coffee and teas.  Condiments Coconut, maple syrup, pickles, olives. Sweets and Desserts Desserts, cookies, or candies that contain nuts or coconut, chunky peanut butter, dried fruits. Jams, preserves with seeds, marmalade. Large amounts of sugar and sweets. Any other dessert made with fruits from the not recommended list.  Other Soups made from vegetables that are not recommended or that contain other foods not recommended.  The items listed above may not be a complete list of foods and beverages to avoid. Contact your dietitian for more information. Document Released: 06/07/2002 Document Revised: 12/21/2013 Document Reviewed: 11/08/2013 [facility] Patient Information 2015 [place], [place]. This information is not intended to replace advice given to you by your health care provider. Make sure you discuss any questions you have with your health care provider.  High-Fiber Diet, after the first 3 weeks of low fiber Fiber is found in fruits, vegetables, and grains. A high-fiber diet encourages the addition of more whole grains, legumes, fruits, and vegetables in your diet. The recommended amount of fiber for adult males is 38 g per day. For adult females, it is 25 g per day. Pregnant and lactating women should get 28 g of fiber per day. If you have a digestive or bowel problem, ask your caregiver for advice before adding high-fiber foods to your diet. Eat a variety of high-fiber foods instead of only a select few type of foods.  PURPOSE  To increase stool bulk.  To make bowel movements more regular to prevent constipation.  To lower cholesterol.  To prevent overeating.  WHEN IS THIS DIET USED?  It may be used if you have constipation and hemorrhoids.  It may be used if you have uncomplicated diverticulosis (intestine condition) and irritable bowel syndrome.  It may be used if you need help with weight management.  It may be used if you want to add it to your diet as a protective measure against  atherosclerosis, diabetes, and cancer. SOURCES OF FIBER  Whole-grain breads and cereals.  Fruits, such as apples, oranges, bananas, berries, prunes, and pears.  Vegetables, such as green peas, carrots, sweet potatoes, beets, broccoli, cabbage, spinach, and artichokes.  Legumes, such split peas, soy, lentils.  Almonds. FIBER CONTENT IN FOODS Starches and Grains / Dietary Fiber (g)  Cheerios, 1 cup / 3 g  Corn Flakes cereal, 1 cup / 0.7 g  Rice crispy treat cereal, 1 cup / 0.3 g  Instant oatmeal (cooked),  cup / 2 g  Frosted wheat cereal, 1 cup / 5.1 g  Brown, long-grain rice (cooked), 1 cup / 3.5 g  White, long-grain rice (cooked), 1 cup / 0.6 g  Enriched macaroni (cooked), 1 cup / 2.5 g Legumes / Dietary Fiber (g)  Baked beans (canned, plain, or vegetarian),  cup / 5.2 g  Kidney beans (canned),  cup / 6.8 g  Pinto beans (cooked),  cup / 5.5 g Breads and Crackers / Dietary Fiber (g)  Plain or honey graham crackers, 2 squares / 0.7 g  Saltine crackers, 3 squares / 0.3 g  Plain, salted pretzels, 10 pieces / 1.8 g  Whole-wheat bread, 1 slice / 1.9 g  White bread, 1 slice / 0.7 g  Raisin bread, 1 slice / 1.2 g  Plain bagel, 3 oz / 2 g  Flour tortilla, 1 oz / 0.9 g  Corn tortilla, 1 small / 1.5 g  Hamburger or hotdog bun, 1 small / 0.9 g Fruits / Dietary Fiber (g)  Apple with skin, 1 medium / 4.4 g  Sweetened applesauce,  cup / 1.5 g  Banana,  medium / 1.5 g  Grapes, 10 grapes / 0.4 g  Orange, 1 small / 2.3 g  Raisin, 1.5 oz / 1.6 g  Melon, 1 cup / 1.4 g Vegetables / Dietary Fiber (g)  Green beans (canned),  cup / 1.3 g  Carrots (cooked),  cup / 2.3 g  Broccoli (cooked),  cup / 2.8 g  Peas (cooked),  cup / 4.4 g  Mashed potatoes,  cup / 1.6 g  Lettuce, 1 cup / 0.5 g  Corn (canned),  cup / 1.6 g  Tomato,  cup / 1.1 g Document Released: 12/16/2005 Document Revised: 06/16/2012 Document Reviewed: 03/19/2012 [employer]  Patient Information 2015 [place], [place]. This information is not intended to replace advice given to you by your health care provider. Make sure you discuss any questions you have with your health care provider.

## 2015-02-15 NOTE — Discharge Summary (Signed)
Name: Alisha [name] MRN: [id number] DOB: 1961-12-04 54 y.o. PCP: Julious Oka, MD  Date of Admission: 02/11/2015  7:43 AM Date of Discharge: 02/15/2015 Attending Physician: No att. providers found  Discharge Diagnosis:  Principal Problem:   Diverticulitis of intestine with abscess Active Problems:   Depression   Long term current use of anticoagulant   Anxiety   HTN (hypertension)   Colonic diverticular abscess   Superficial venous thrombosis of arm  Discharge Medications:   Medication List    STOP taking these medications        ciprofloxacin 500 MG tablet  Commonly known as:  CIPRO     metroNIDAZOLE 500 MG tablet  Commonly known as:  FLAGYL      TAKE these medications        albuterol 108 (90 BASE) MCG/ACT inhaler  Commonly known as:  PROVENTIL HFA;VENTOLIN HFA  Inhale 1-2 puffs into the lungs every 4 (four) hours as needed for wheezing or shortness of breath.     amoxicillin-clavulanate 875-125 MG per tablet  Commonly known as:  AUGMENTIN  Take 1 tablet by mouth 2 (two) times daily.     aspirin EC 81 MG tablet  Take 81 mg by mouth daily.     atorvastatin 40 MG tablet  Commonly known as:  LIPITOR  Take 1 tablet (40 mg total) by mouth at bedtime.     buPROPion 300 MG 24 hr tablet  Commonly known as:  WELLBUTRIN XL  Take 300 mg by mouth daily.     carvedilol 25 MG tablet  Commonly known as:  COREG  Take 1 tablet (25 mg total) by mouth 2 (two) times daily with a meal.     diazepam 5 MG tablet  Commonly known as:  VALIUM  Take 10 mg by mouth 3 (three) times daily.     DULoxetine 60 MG capsule  Commonly known as:  CYMBALTA  Take 60 mg by mouth daily.     gabapentin 800 MG tablet  Commonly known as:  NEURONTIN  Take 1 tablet in the morning, 1 tablet midday and 2 tablets at bedtime     GEODON 60 MG capsule  Generic drug:  ziprasidone  Take 60 mg by mouth 2 (two) times daily with a meal.     hydrochlorothiazide 12.5 MG tablet  Commonly known as:   HYDRODIURIL  Take 1 tablet (12.5 mg total) by mouth daily.     hydrOXYzine 25 MG tablet  Commonly known as:  ATARAX/VISTARIL  Take 25 mg by mouth 3 (three) times daily as needed for anxiety.     lisinopril 20 MG tablet  Commonly known as:  PRINIVIL,ZESTRIL  Take 1 tablet (20 mg total) by mouth 2 (two) times daily.     ONE-A-DAY 50 PLUS PO  Take 1 tablet by mouth daily.     potassium chloride 10 MEQ tablet  Commonly known as:  K-DUR  TAKE 1 TABLET BY MOUTH EVERY DAY     traMADol 50 MG tablet  Commonly known as:  ULTRAM  Take 1 tablet (50 mg total) by mouth every 6 (six) hours as needed for severe pain.     warfarin 7.5 MG tablet  Commonly known as:  COUMADIN  Take 3.75-7.5 mg by mouth daily. Take 3.75mg  alternating with 7.5mg  daily Mon wed and fridays take 3.75mg  rest of days take 7.5mg         Disposition and follow-up:   Ms.Angie NIKKOLE PLACZEK was discharged from [facility] in Stable condition.  At the hospital follow up visit please address:  1.  Abdominal symptoms, status of drain and drain hygiene, antibiotics, anti-coagulation, repeat CT by IR/general surgery in 2 weeks  2.  Labs / imaging needed at time of follow-up: CBC (consider changing from augmentin to cipro/flagyl if leukocytosis), INR  3.  Pending labs/ test needing follow-up: none  Follow-up Appointments: Follow-up Information    Follow up with Erroll Luna A., MD On 03/07/2015.   Specialty:  General Surgery   Why:  arrive by 2:10pm for a 2:40pm appointment   Contact information:   [address] [place] [address] [phone]       Follow up with Blain Pais, MD On 02/17/2015.   Specialty:  Internal Medicine   Why:  @ 2:15 pm   Contact information:   [place] [address] [phone]       IR to contact patient  Discharge Instructions: Discharge Instructions    Diet - low sodium heart healthy    Complete by:  As directed      Increase  activity slowly    Complete by:  As directed            Consultations: Treatment Team:  Md Ccs, MD  Procedures Performed:  Ct Abdomen Pelvis W Contrast  02/14/2015   CLINICAL DATA:  Low abdominal pain, drain placed for left lower quadrant diverticular abscess 02/09/2015  EXAM: CT ABDOMEN AND PELVIS WITH CONTRAST  TECHNIQUE: Multidetector CT imaging of the abdomen and pelvis was performed using the standard protocol following bolus administration of intravenous contrast.  CONTRAST:  171mL OMNIPAQUE IOHEXOL 300 MG/ML  SOLN  COMPARISON:  02/09/2015, 02/07/2015  FINDINGS: Lower chest: Dependent bibasilar atelectasis is slightly increased since previously. Trace pleural fluid is now identified. Heart size upper limits of normal with trace pericardial fluid.  Hepatobiliary: Liver and gallbladder are unremarkable.  Pancreas: Normal  Spleen: Normal  Adrenals/Urinary Tract: Adrenal glands and kidneys appear unremarkable.  Stomach/Bowel: Again noted is short segmental rectosigmoid colonic wall thickening with surrounding stranding and a few foci of adjacent gas, compatible with diverticulitis as seen on the prior exam. Within the left lower quadrant, there is a pigtail catheter in place with interval decrease in size of rim enhancing gas/ fluid collection compatible with improving diverticular abscess, now 2.8 x 2.3 cm image 66.  Vascular/Lymphatic: Moderate atheromatous aortic calcification noted without aneurysm. Scattered subcentimeter retroperitoneal nodes are reidentified, largest measuring 0.7 cm image 54.  Other: A small amount of pelvic free fluid is present. Presumed calcified uterine fibroid noted. Right ovary appears normal. Left ovary is not visualized.  Musculoskeletal: Disc degenerative change noted in the lower lumbar spine. No acute osseous abnormality.  IMPRESSION: Significant interval decrease in left lower quadrant diverticular abscess after drainage catheter placement. No new acute  intra-abdominal or pelvic pathology.   Electronically Signed   By: Conchita Paris M.D.   On: 02/14/2015 13:59   Ct Abdomen Pelvis W Contrast  02/07/2015   CLINICAL DATA:  Lower abdominal pain.  EXAM: CT ABDOMEN AND PELVIS WITH CONTRAST  TECHNIQUE: Multidetector CT imaging of the abdomen and pelvis was performed using the standard protocol following bolus administration of intravenous contrast.  CONTRAST:  130mL OMNIPAQUE IOHEXOL 300 MG/ML  SOLN  COMPARISON:  CT scan of February 06, 2015.  FINDINGS: Visualized lung bases appear normal. No significant osseous abnormality is noted.  No gallstones are noted. The liver, spleen and pancreas appear  normal. Adrenal glands appear normal. Small right renal cyst is noted. No hydronephrosis or renal obstruction is noted. Atherosclerotic calcifications of abdominal aorta are noted without aneurysm formation. The appendix appears normal. 7.8 x 4.6 cm complex fluid collection is seen inferior to inflamed portion of sigmoid colon consistent with diverticular abscess. There is no evidence of bowel obstruction. Urinary bladder appears normal. Calcified fibroid is noted in the uterus. Mild fat containing periumbilical hernia is noted with inflammatory stranding.  IMPRESSION: Diverticulitis is sigmoid colon is noted with adjacent 7.8 x 4.6 cm complex fluid collection consistent with diverticular abscess.  Mild fat containing periumbilical hernia is noted which may be inflamed.   Electronically Signed   By: Marijo Conception, M.D.   On: 02/07/2015 20:12   Ct Angio Ao+bifem W/cm &/or Wo/cm  02/06/2015   CLINICAL DATA:  Back pain, nausea and fever. History of peripheral vascular disease with known chronic SFA occlusions and right external iliac artery stenosis.  EXAM: CT ANGIOGRAPHY OF ABDOMINAL AORTA WITH ILIOFEMORAL RUNOFF  TECHNIQUE: Multidetector CT imaging of the abdomen, pelvis and lower extremities was performed using the standard protocol during bolus administration of  intravenous contrast. Multiplanar CT image reconstructions and MIPs were obtained to evaluate the vascular anatomy.  CONTRAST:  148mL OMNIPAQUE IOHEXOL 350 MG/ML SOLN  COMPARISON:  10/03/2008  FINDINGS: Aorta: The abdominal aorta is normally patent and demonstrates mild plaque. No evidence of aortic aneurysm or stenosis. The celiac axis, superior mesenteric artery and inferior mesenteric artery show stable and normal patency. Midportion of the left renal artery demonstrates stable moderate focal stenosis of approximately 50% narrowing. Mild progression of plaque in the proximal right renal artery without significant stenosis identified.  Right Lower Extremity: Calcified plaque present in the common iliac artery without significant stenosis. Previously identified focal stenosis of the mid right external iliac artery has been stented with a widely patent stent present. The common femoral artery shows distal stenosis of approximately 50%. The profunda femoral artery is normally patent. The SFA is chronically occluded at its origin. Distal SFA is reconstituted by collaterals at the adductor hiatus. The popliteal artery is normally patent. Three-vessel runoff is demonstrated into the foot with dominant anterior tibial runoff.  Left Lower Extremity: Calcified plaque is present in the common and external iliac arteries without significant stenosis identified. Distal common femoral artery becomes of larger caliber, likely related to prior patch angioplasty. The native SFA is chronically occluded. The profunda femoral artery is open. The popliteal artery is reconstituted above the knee. Peroneal posterior tibial arteries are open into the foot. The anterior tibial artery is open but poorly opacified and diseased with calcified plaque present.  Arterial phase evaluation of the solid organs is unremarkable including the liver, gallbladder, pancreas, spleen, adrenal glands and kidneys.  There is thickening and inflammation  involving the sigmoid colon. Lumen is difficult to follow in the pelvis and there may be some developing areas of abscess formation. Findings are most likely consistent with diverticulitis. Mass cannot be excluded. Follow-up imaging with CT of the abdomen and pelvis during venous phase contrast opacification and with oral contrast may be helpful. No free intraperitoneal air is identified. No enlarged lymph nodes are seen.  Review of the MIP images confirms the above findings.  IMPRESSION: 1. Evidence of probable diverticulitis involving the sigmoid colon. There may be developing adjacent diverticular abscess is predominantly posterior and inferior to the sigmoid colon. This is difficult to delineate without oral contrast on poor and during the arterial  phase CTA. Mass of the colon cannot be excluded on the current study. Follow-up with venous phase CT of the abdomen and pelvis with oral contrast would be helpful. 2. 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 3. Chronic bilateral SFA occlusions.   Electronically Signed   By: Aletta Edouard M.D.   On: 02/06/2015 17:19   Ct Image Guided Drainage By Percutaneous Catheter  02/09/2015   CLINICAL DATA:  Left lower quadrant diverticular abscess  EXAM: CT GUIDED DRAINAGE OF LEFT LOWER QUADRANT DIVERTICULAR ABSCESS  ANESTHESIA/SEDATION: 1.5 Mg IV Versed 75 mcg IV Fentanyl  Total Moderate Sedation Time:  15 minutes  PROCEDURE: The procedure, risks, benefits, and alternatives were explained to the patient. Questions regarding the procedure were encouraged and answered. The patient understands and consents to the procedure.  The left lower quadrant was prepped with Betadinein a sterile fashion, and a sterile drape was applied covering the operative field. A sterile gown and sterile gloves were used for the procedure. Local anesthesia was provided with 1% Lidocaine.  Previous imaging reviewed. Patient position left anterior oblique. Initially noncontrast  localization CT performed. The ill-defined air-fluid collection adjacent to the sigmoid colon is difficult to delineate from the adjacent bowel. For better visualization, 100 cc Omnipaque 300 contrast administered. Repeat imaging performed through the pelvis. Following contrast, the pelvic abscess is better visualized in relation to the adjacent sigmoid. Under sterile conditions and local anesthesia, an 18 gauge 15 cm access needle was advanced into the abscess. Guidewire advanced followed by tract dilatation to insert a 10 [place] drain. Drain catheter position confirmed with CT. Syringe aspiration yielded purulent fluid. Sample sent for Gram stain and culture. Catheter secured with a Prolene suture and connected to external suction bulb. Sterile dressing applied. No immediate complication. Patient tolerated the procedure well.  COMPLICATIONS: None immediate  FINDINGS: Imaging confirms needle access for insertion of a left lower quadrant diverticular abscess drain adjacent to the sigmoid colon.  IMPRESSION: Successful CT-guided left lower quadrant diverticular abscess drain insertion.   Electronically Signed   By: Jerilynn Mages.  Shick M.D.   On: 02/09/2015 11:45   Admission HPI: Ms Markwood is a 54 yo female with hx of DVT on warfarin, chronic leg pain, anxiety/depression, HLD, HTN, PVD (fem bypass, stent) , hx of sigmoid diverticulitis 2013,who left AMA on 02/09/15 with diverticulitis s/p diverticular abscess drain placement same day who returns with abdominal pain. She has sigmoid diverticulitis and CT w oral contrast demonstrated adjacent 7.8 x 4.6 cm complex fluid collection consistent with diverticular abscess. She had successful placement of an IR drain with no complications the morning of 02/09/15. Sample was sent for culture and gram stain which are pending. She was on iv cipro and flagyl during her stay. For two days, Ms Mcevoy insisted she had to leave against medical advice. She said she has a home issue that cannot be  dealt with remotely. She has refused to tell any RN or MD what this issue is. She was seen by social work and also refused to tell them. She told me that she feels safe at home. We offered to call police to help with issue but she refused. She was adamant that she must leave. We made it very clear to Ms Dukeman that she would be leaving against medical advice as she has a drain recently placed which is an infection risk. She was told several times that the infection could become worse and spread through her body and she could die. She  still insisted she was leaving so a total of 14 days of ciprofloxacin 500 mg bid and flagyl 500 mg tid was sent to her pharmacy. I explained to her that this would not adequately treat her infection as she is now on iv antibiotics but this would be better than nothing. We also scheduled follow-up appointments with internal medicine and surgery should she leave AMA as a safety net but stressed that she should not leave. RN counseled her on drain hygiene.  She returned to [facility] clinic on 2/12 but refused a readmission or any lab draws. She said she drainage was thick but she had no fevers or abdominal pain. She said she had been taking antibiotics as prescribed. She said she was agreeable to return today for direct admission.   Today, she returned with abdominal pain. She says it is a cramping feeling like she is in labor. It is worse in the lower abdomen. She has no nausea, emesis, fever. Last bowel movement two days ago. She said she has been flushing her drain at home and it has filled up about half full in a day. She said her house was "wide open" and her roommate had left. She said she will stay as long as it takes for proper treatment this time because she already has to change the locks on her house. She expressed fear that she might need a colostomy.  Hospital Course by problem list: Principal Problem:   Diverticulitis of intestine with abscess Active Problems:    Depression   Long term current use of anticoagulant   Anxiety   HTN (hypertension)   Colonic diverticular abscess   Superficial venous thrombosis of arm   #Diverticulitis with Abscess: Ms Hesch was restarted on iv cipro flagyl when she returned for this current admission. She was again followed by general surgery and IR. Her WBC was already down to 10.5 when she returned (17.6 on previous admission) and she said she took the cipro and flagyl po at home. She remained afebrile and WBC cotninued down trend to 8.7.  Her symptoms have continued to improve and she has tolerated advancement of her diet.Her abscess culture from IR drain placement 02/09/15 returned with microaerophilic streptococci adn she was changed from iv cipro flagyl to iv unasyn. Repeat CT 2/16 with significant interval decrease in abscess. She has been cleared by general surgery and IR for discharge. They will arrange follow-up in their clinics as well as repeat CT imaging in about two weeks. IR recommends daily irrigation of drain with 5 cc's sterile normal saline. Ordered home health RN for daily drain hygiene. She will have two additional weeks of augmentin 875 mg bid. The morning of discharge she was feeling well but her WBC was 12.7. Stat repeat CBC 10.7. Recommend repeat CBC on follow-up and if still leukocytosis then consider changing to po cipro flagyl. She was given one week of tramadol 50 mg q6hprn.  #Diarrhea: Ms Curto reports several loose bowel movements following oral contrast administration. This was likely trigger by the contrast as she had no diarrhea the day after discharge. C diff negative.   #L arm phlebitis: Ms Schmale L arm acute superficial vein thrombosis of L cephalic vein noted on doppler at site of previous iv on last admission is nearly back to baseline with compression and elevation. Still some minimal swelling. She should continue compressions and elevation.  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 She was subtherapeutic  with INR 1.21 on previous presentation and started on a heparin bridge. Her INR on return this admission was 1.80. We held her warfarin until 2/16 to be sure surgery was not planning any procedure pending CT results. INR the morning of discharge 1.34. Warfarin was resumed and INR should be checked at follow-up.  #HTN: BP 140s/80s-90s overnight. At home she is on ASA 81 mg daily, carvedilol 25 mg bid, lisinopril 20 mg bid, HCTZ 12.5 mg daily, atorvastatin 40 mg daily which were continued. HCTZ and lisinopril initially held until repeat CT w contrast then resumed.  #Chronic CHF: Non-contributory. Echo 2007 45% EF per EMR. At home she is on ASA 81 mg daily, carvedilol 25 mg bid, lisinopril 20 mg bid, KDur 10 mEq which were continued. HCTZ and lisinopril initially held until repeat CT w contrast then resumed.  #CAD: Non-contributory. 02/10/14 stress test low risk but anterior abn, Cath on 02/25/14 nonobstructive CAD, normal LV function 55-65%.  At home she is on ASA 81 mg daily, carvedilol 25 mg bid, lisinopril 20 mg bid, atorvastatin 40 mg daily which were continued. Lisinopril initially held until repeat Ct w contrast then resumed.  #HLD: Last lipid profile 10/01/2011 w cholesterol 162, triglyceride 205, HDL 25, LDL 96. At home she is on atorvastatin 40 mg daily which was continued.  Discharge Vitals:   BP 143/97 mmHg  Pulse 94  Temp(Src) 98.2 F (36.8 C) (Oral)  Resp 18  Ht 5' 4.5" (1.638 m)  Wt 185 lb (83.915 kg)  BMI 31.28 kg/m2  SpO2 98%  LMP 01/22/2015  Discharge Physical Exam: Gen: A&O x 4, no acute distress laying in bed, well developed, well nourished HEENT: Atraumatic, PERRL, EOMI, sclerae anicteric, moist mucous membranes Heart: Regular rate and rhythm, normal S1 S2, no murmurs, rubs, or gallops Lungs: Clear to auscultation bilaterally, respirations unlabored Abd: Soft, minimal tenderness on LLQ, IR drain with minimal  amount of opaque serosanguinous output, non-distended, + bowel sounds, no hepatosplenomegaly Ext: L arm non-tender and minimally swollen, no other edema or cyanosis  Discharge Labs:  Basic Metabolic Panel:  Recent Labs Lab 02/11/15 1105 02/12/15 0050  NA 140 139  K 4.0 3.9  CL 102 107  CO2 29 27  GLUCOSE 92 93  BUN <5* <5*  CREATININE 0.77 0.77  CALCIUM 8.8 8.5   Liver Function Tests:  Recent Labs Lab 02/11/15 1105  AST 17  ALT 11  ALKPHOS 54  BILITOT 0.4  PROT 6.2  ALBUMIN 2.8*   CBC:  Recent Labs Lab 02/15/15 0435 02/15/15 0930  WBC 12.7* 10.7*  HGB 12.7 12.0  HCT 39.7 37.7  MCV 98.3 97.7  PLT 360 327   Coagulation:  Recent Labs Lab 02/09/15 0649 02/11/15 1105 02/14/15 0317 02/15/15 0435  LABPROT 17.1* 21.1* 22.8* 17.2*  INR 1.38 1.80* 1.99* 1.39     Signed: Kelby Aline, MD 02/15/2015, 1:38 PM    Services Ordered on Discharge: home health nursing Equipment Ordered on Discharge: none

## 2015-02-16 ENCOUNTER — Telehealth: Payer: Self-pay | Admitting: Internal Medicine

## 2015-02-16 LAB — ANAEROBIC CULTURE

## 2015-02-16 NOTE — Telephone Encounter (Signed)
Call to patient to confirm appointment for 02/17/15 at 2:15 mailbox is full unable to leave message. Cell is disconnected

## 2015-02-17 ENCOUNTER — Ambulatory Visit: Payer: Medicare Other | Admitting: Internal Medicine

## 2015-02-17 LAB — CULTURE, BLOOD (ROUTINE X 2)
CULTURE: NO GROWTH
Culture: NO GROWTH

## 2015-02-21 ENCOUNTER — Ambulatory Visit: Payer: Medicare Other | Admitting: Internal Medicine

## 2015-02-21 ENCOUNTER — Telehealth: Payer: Self-pay | Admitting: Internal Medicine

## 2015-02-21 NOTE — Telephone Encounter (Signed)
Call to patient to confirm appointment for 02/22/15 at 8:15 mail box full on home number cell is disconnected

## 2015-02-22 ENCOUNTER — Ambulatory Visit: Payer: Medicare Other | Admitting: Internal Medicine

## 2015-02-23 ENCOUNTER — Telehealth: Payer: Self-pay | Admitting: Pharmacist

## 2015-02-23 NOTE — Telephone Encounter (Signed)
Call to patient to confirm appointment for 02/27/15 at 9:30. lmtcb ° °

## 2015-02-27 ENCOUNTER — Ambulatory Visit: Payer: Medicare Other

## 2015-02-27 ENCOUNTER — Ambulatory Visit (INDEPENDENT_AMBULATORY_CARE_PROVIDER_SITE_OTHER): Payer: Medicare Other | Admitting: Pharmacist

## 2015-02-27 DIAGNOSIS — Z7901 Long term (current) use of anticoagulants: Secondary | ICD-10-CM | POA: Diagnosis not present

## 2015-02-27 LAB — POCT INR: INR: 1.7

## 2015-02-27 MED ORDER — RIVAROXABAN 20 MG PO TABS: 20.0000 mg | ORAL_TABLET | Freq: Every day | ORAL | Status: DC

## 2015-02-27 NOTE — Progress Notes (Signed)
Anti-Coagulation Progress Note  Alisha [name] is a 54 y.o. female who is currently on an anti-coagulation regimen.    RECENT RESULTS: Recent results are below, the most recent result is correlated with a dose of 56.25 mg. per week:  She had interruption of warfarin while hospitalized due to DDI's with warfarin+cipro+metronidazole. She had to be commenced upon [place] during that interruption and was advised to HOLD warfarin for 3 days following discharge from hospital. A discussion with patient was had regarding the potential role of a DOAC that would in this instance preclude any "hold" to her traditional VKA therapy, i.e., use of a DOAC where the DDI with cipro and metronidazole do not exist within the package insert. Her time in target range for warfarin is documented as < 60%; her SAMe-TT2R2 score is "6" suggesting that her use of warfarin may be problematic. Due to the DDI's while hospitalized as well as the considerations of her TTR and SAMe-TT2R2 score we had a discussion about transitioning from warfarin to a DOAC. We discussed benefits (non-inferiority to warfarin) and superiority relative to major bleeding potential for rivaroxaban vs. That of warfarin. We discussed no monitoring requirements. We discussed the hospitals approach to managing bleeding related to rivaroxaban therapy by the hospitals P&T and Medical Board approved protocols. She was provided a 30 day free card for access. We contacted her pharmacy where they indicate as a Medicare patient her co-pay was to be $3.60 for one months supply of drug. She was provided my phone number to address any continue questions she may have regarding her therapy. She was commenced on Xarelto in 2013 but elected to discontinue at that time. She is more comfortable today for recommencing this therapy after discussion of bleeding management should that occur. Review of her labs for renal function and hepatic function indicate at this time that the 20mg  PO QD  with evening meal is an appropriate dose and interval. When discussed other options/choices she stated a preference to a once-daily drug therapy.  Lab Results  Component Value Date   INR 1.70 02/27/2015   INR 1.39 02/15/2015   INR 1.99* 02/14/2015    ANTI-COAG DOSE: Anticoagulation Dose Instructions as of 02/27/2015      Sun Mon Tue Wed Thu Fri Sat   New Dose 7.5 mg 11.25 mg 7.5 mg 11.25 mg 7.5 mg 11.25 mg 7.5 mg    Description        Discontinue warfarin today. Commence upon Xarelto 20mg  by mouth daily with evening meal.        ANTICOAG SUMMARY:   ANTICOAG TODAY: Anticoagulation Summary as of 02/27/2015    INR goal 2.0-3.0  Selected INR 1.70! (02/27/2015)  Next INR check   Target end date    Indications  DVT (Resolved) [I82.409] Long term current use of anticoagulant (Resolved) [Z79.01]      Anticoagulation Episode Summary    [facility]   Preferred lab    Discontinue date 02/27/2015   Discontinue reason Alternate Therapy Initiated   Send INR reminders to ANTICOAG [facility]   Comments     Anticoagulation Care Providers    Provider Role Specialty Phone number   Burman Freestone, MD  Internal Medicine [phone]      PATIENT INSTRUCTIONS: Patient Instructions  Patient instructed to take medications as defined in the Anti-coagulation Track section of this encounter.  Patient instructed to OMIT today's dose. Patient instructed to DISCONTINUE warfarin/Coumadin.  Patient instructed to commence upon Xarelto 20mg   to be taken by mouth once-daily with evening meal.   Patient verbalized understanding of these instructions.       FOLLOW-UP Return in 7 days (on 03/06/2015) for Follow up visit on DOAC.  Jorene Guest, III Pharm.D., CACP

## 2015-02-27 NOTE — Patient Instructions (Signed)
Patient instructed to take medications as defined in the Anti-coagulation Track section of this encounter.  Patient instructed to OMIT today's dose. Patient instructed to DISCONTINUE warfarin/Coumadin.  Patient instructed to commence upon Xarelto 20mg  to be taken by mouth once-daily with evening meal.   Patient verbalized understanding of these instructions.

## 2015-02-28 NOTE — Progress Notes (Signed)
INTERNAL MEDICINE TEACHING ATTENDING ADDENDUM - Aldine Contes M.D  Duration- indefinite, Indication- DVT, INR- subtherapeutic. Agree with Dr. Gladstone Pih recommendations as outlined in his note.  Pt will now discontinue warfarin and start xarelto

## 2015-03-06 ENCOUNTER — Ambulatory Visit: Payer: Medicare Other

## 2015-03-07 ENCOUNTER — Other Ambulatory Visit (INDEPENDENT_AMBULATORY_CARE_PROVIDER_SITE_OTHER): Payer: Self-pay | Admitting: Surgery

## 2015-03-07 DIAGNOSIS — K5720 Diverticulitis of large intestine with perforation and abscess without bleeding: Secondary | ICD-10-CM | POA: Diagnosis not present

## 2015-03-07 NOTE — Addendum Note (Signed)
Addended by: Erroll Luna A on: 03/07/2015 03:11 PM   Modules accepted: Orders

## 2015-03-09 ENCOUNTER — Other Ambulatory Visit (HOSPITAL_COMMUNITY): Payer: Self-pay | Admitting: Diagnostic Radiology

## 2015-03-09 ENCOUNTER — Ambulatory Visit (HOSPITAL_COMMUNITY)
Admission: RE | Admit: 2015-03-09 | Discharge: 2015-03-09 | Disposition: A | Discharge status: Home / Self Care | Payer: Medicare Other | Source: Ambulatory Visit | Attending: Interventional Radiology | Admitting: Interventional Radiology

## 2015-03-09 DIAGNOSIS — L0291 Cutaneous abscess, unspecified: Secondary | ICD-10-CM

## 2015-03-09 DIAGNOSIS — Z4682 Encounter for fitting and adjustment of non-vascular catheter: Secondary | ICD-10-CM | POA: Diagnosis not present

## 2015-03-09 DIAGNOSIS — K630 Abscess of intestine: Secondary | ICD-10-CM | POA: Diagnosis not present

## 2015-03-09 DIAGNOSIS — K5780 Diverticulitis of intestine, part unspecified, with perforation and abscess without bleeding: Secondary | ICD-10-CM | POA: Diagnosis not present

## 2015-03-09 DIAGNOSIS — F333 Major depressive disorder, recurrent, severe with psychotic symptoms: Secondary | ICD-10-CM | POA: Diagnosis not present

## 2015-03-09 MED ORDER — LIDOCAINE HCL 1 % IJ SOLN
INTRAMUSCULAR | Status: AC
  Filled 2015-03-09: qty 20

## 2015-03-09 MED ORDER — IOHEXOL 300 MG/ML  SOLN
50.0000 mL | Freq: Once | INTRAMUSCULAR | Status: AC | PRN
  Administered 2015-03-09: 20 mL via INTRAVENOUS

## 2015-03-09 NOTE — Procedures (Signed)
Interventional Radiology Procedure Note  Procedure: Exchange of abscess drain into the lower abdomen.   Findings: opacification of cavity.  Persisting abscess.  Continued foul drainage.    Complications: No immediate Recommendations:  - Ok to shower tomorrow - Patient reports that her surgeon will be prescribing antibiotics. - Routine care  - follow up in drain clinic in 2 weeks with CT scan  Signed,  Dulcy Fanny. Earleen Newport, DO

## 2015-03-15 DIAGNOSIS — F333 Major depressive disorder, recurrent, severe with psychotic symptoms: Secondary | ICD-10-CM | POA: Diagnosis not present

## 2015-03-16 ENCOUNTER — Telehealth (HOSPITAL_COMMUNITY): Payer: Self-pay | Admitting: Diagnostic Radiology

## 2015-03-16 ENCOUNTER — Other Ambulatory Visit (HOSPITAL_COMMUNITY): Payer: Self-pay | Admitting: Diagnostic Radiology

## 2015-03-16 DIAGNOSIS — N739 Female pelvic inflammatory disease, unspecified: Secondary | ICD-10-CM

## 2015-03-16 DIAGNOSIS — L0291 Cutaneous abscess, unspecified: Secondary | ICD-10-CM

## 2015-03-16 NOTE — Telephone Encounter (Signed)
Called pt, VM full unable to leave message to schedule next f/u in drain clinic for 03/21/15. JM

## 2015-03-28 ENCOUNTER — Ambulatory Visit (HOSPITAL_COMMUNITY)
Admission: RE | Admit: 2015-03-28 | Discharge: 2015-03-28 | Disposition: A | Discharge status: Home / Self Care | Payer: Medicare Other | Source: Ambulatory Visit | Attending: Interventional Radiology | Admitting: Interventional Radiology

## 2015-03-28 ENCOUNTER — Other Ambulatory Visit (HOSPITAL_COMMUNITY): Payer: Self-pay | Admitting: Interventional Radiology

## 2015-03-28 ENCOUNTER — Encounter (HOSPITAL_COMMUNITY): Payer: Self-pay

## 2015-03-28 ENCOUNTER — Ambulatory Visit (HOSPITAL_COMMUNITY)
Admission: RE | Admit: 2015-03-28 | Discharge: 2015-03-28 | Disposition: A | Discharge status: Home / Self Care | Payer: Medicare Other | Source: Ambulatory Visit | Attending: Diagnostic Radiology | Admitting: Diagnostic Radiology

## 2015-03-28 DIAGNOSIS — N732 Unspecified parametritis and pelvic cellulitis: Secondary | ICD-10-CM | POA: Diagnosis not present

## 2015-03-28 DIAGNOSIS — N739 Female pelvic inflammatory disease, unspecified: Secondary | ICD-10-CM

## 2015-03-28 DIAGNOSIS — L0291 Cutaneous abscess, unspecified: Secondary | ICD-10-CM

## 2015-03-28 DIAGNOSIS — K5780 Diverticulitis of intestine, part unspecified, with perforation and abscess without bleeding: Secondary | ICD-10-CM | POA: Diagnosis not present

## 2015-03-28 MED ORDER — IOHEXOL 300 MG/ML  SOLN
100.0000 mL | Freq: Once | INTRAMUSCULAR | Status: AC | PRN
  Administered 2015-03-28: 100 mL via INTRAVENOUS

## 2015-03-28 MED ORDER — IOHEXOL 300 MG/ML  SOLN
50.0000 mL | Freq: Once | INTRAMUSCULAR | Status: AC | PRN
  Administered 2015-03-28: 10 mL via INTRAVENOUS

## 2015-03-28 NOTE — Procedures (Signed)
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  followup injection in 2 weeks

## 2015-04-06 ENCOUNTER — Other Ambulatory Visit (HOSPITAL_COMMUNITY): Payer: Self-pay | Admitting: Interventional Radiology

## 2015-04-06 DIAGNOSIS — N739 Female pelvic inflammatory disease, unspecified: Secondary | ICD-10-CM

## 2015-04-10 ENCOUNTER — Telehealth: Payer: Self-pay | Admitting: Pharmacist

## 2015-04-10 ENCOUNTER — Ambulatory Visit: Payer: Medicare Other

## 2015-04-10 NOTE — Telephone Encounter (Signed)
Attempted to call patient for follow up after transition from warfarin to Xarelto. No answer on main phone number on file and other numbers did not work.

## 2015-04-11 ENCOUNTER — Ambulatory Visit (HOSPITAL_COMMUNITY)
Admission: RE | Admit: 2015-04-11 | Discharge: 2015-04-11 | Disposition: A | Discharge status: Home / Self Care | Payer: Medicare Other | Source: Ambulatory Visit | Attending: Interventional Radiology | Admitting: Interventional Radiology

## 2015-04-11 DIAGNOSIS — K5780 Diverticulitis of intestine, part unspecified, with perforation and abscess without bleeding: Secondary | ICD-10-CM | POA: Insufficient documentation

## 2015-04-11 DIAGNOSIS — Z4889 Encounter for other specified surgical aftercare: Secondary | ICD-10-CM | POA: Diagnosis not present

## 2015-04-11 DIAGNOSIS — Z4803 Encounter for change or removal of drains: Secondary | ICD-10-CM | POA: Insufficient documentation

## 2015-04-11 DIAGNOSIS — N739 Female pelvic inflammatory disease, unspecified: Secondary | ICD-10-CM

## 2015-04-11 DIAGNOSIS — K630 Abscess of intestine: Secondary | ICD-10-CM | POA: Diagnosis not present

## 2015-04-11 MED ORDER — IOHEXOL 300 MG/ML  SOLN
50.0000 mL | Freq: Once | INTRAMUSCULAR | Status: AC | PRN
  Administered 2015-04-11: 10 mL via INTRAVENOUS

## 2015-04-17 ENCOUNTER — Telehealth: Payer: Self-pay | Admitting: Pharmacist

## 2015-04-17 NOTE — Telephone Encounter (Signed)
Patient called stating she no longer wished to be on Xarelto and had discontinued it on her own volition. I gave her dosing recomendations for warfarin based upon previous dosing history. She is to see me in 1 wk in [facility] for her first INR check. She has warfarin 7.5mg  strength tablets remaining from before going on Xarelto. She is instructed to take 1&1/2 tablets of her 7.5mg  strength warfarin today, tomorrow and Wednesday; on Thursday she will take 1x7.5mg  strength warfarin tablet; on Friday she will take 1&1/2 x 7.5mg  strength warfarin tablets; on Saturday and Sunday she will take 1x7.5mg  warfarin. On Monday 25-APR-16 she will RTC at 1000h for her INR check. She denies any signs or symptoms of bleeding or embolic disease.

## 2015-04-24 ENCOUNTER — Encounter: Payer: Self-pay | Admitting: Internal Medicine

## 2015-04-24 ENCOUNTER — Ambulatory Visit (INDEPENDENT_AMBULATORY_CARE_PROVIDER_SITE_OTHER): Payer: Medicare Other | Admitting: Pharmacist

## 2015-04-24 DIAGNOSIS — Z86718 Personal history of other venous thrombosis and embolism: Secondary | ICD-10-CM | POA: Diagnosis present

## 2015-04-24 DIAGNOSIS — Z7901 Long term (current) use of anticoagulants: Secondary | ICD-10-CM

## 2015-04-24 LAB — POCT INR: INR: 2.9

## 2015-04-24 MED ORDER — WARFARIN SODIUM 7.5 MG PO TABS: 7.5000 mg | ORAL_TABLET | Freq: Every day | ORAL | Status: DC

## 2015-04-24 NOTE — Progress Notes (Signed)
Anti-Coagulation Progress Note  Alisha [name] is a 54 y.o. female who is currently on an anti-coagulation regimen.    RECENT RESULTS: Recent results are below, the most recent result is correlated with a dose of 63.75 mg. per week: Lab Results  Component Value Date   INR 2.90 04/24/2015   INR 1.70 02/27/2015   INR 1.39 02/15/2015    ANTI-COAG DOSE: Anticoagulation Dose Instructions as of 04/24/2015      Dorene Grebe Tue Wed Thu Fri Sat   New Dose 11.25 mg 7.5 mg 7.5 mg 11.25 mg 7.5 mg 7.5 mg 7.5 mg       ANTICOAG SUMMARY: Anticoagulation Episode Summary    Current INR goal 2.0-3.0  Next INR check 05/08/2015  INR from last check 2.90 (04/24/2015)  Weekly max dose   Target end date   [facility]  Preferred lab   Send INR reminders to    Indications  DVT (Resolved) [I82.409]        Comments         ANTICOAG TODAY: Anticoagulation Summary as of 04/24/2015    INR goal 2.0-3.0  Selected INR 2.90 (04/24/2015)  Next INR check 05/08/2015  Target end date    Indications  DVT (Resolved) [I82.409]      Anticoagulation Episode Summary    [facility]   Preferred lab    Send INR reminders to    Comments       PATIENT INSTRUCTIONS: Patient Instructions  Patient instructed to take medications as defined in the Anti-coagulation Track section of this encounter.  Patient instructed to take today's dose.  Patient verbalized understanding of these instructions.       FOLLOW-UP Return in 2 weeks (on 05/08/2015) for Follow up INR at 0945h.  Jorene Guest, III Pharm.D., CACP

## 2015-04-24 NOTE — Patient Instructions (Signed)
Patient instructed to take medications as defined in the Anti-coagulation Track section of this encounter.  Patient instructed to take today's dose.  Patient verbalized understanding of these instructions.    

## 2015-04-24 NOTE — Progress Notes (Signed)
Indication: Recurrent venous thromboembolism. Duration: Lifelong. INR: At target. Agree with Dr. Gladstone Pih assessment and plan.

## 2015-05-04 ENCOUNTER — Telehealth: Payer: Self-pay | Admitting: Internal Medicine

## 2015-05-04 NOTE — Telephone Encounter (Signed)
Call to patient to confirm appointment for 05/05/15 at 9:15 vm full

## 2015-05-05 ENCOUNTER — Encounter: Payer: Self-pay | Admitting: Internal Medicine

## 2015-05-05 ENCOUNTER — Ambulatory Visit: Payer: Medicare Other | Admitting: Internal Medicine

## 2015-05-08 ENCOUNTER — Other Ambulatory Visit: Payer: Self-pay | Admitting: *Deleted

## 2015-05-08 ENCOUNTER — Ambulatory Visit (INDEPENDENT_AMBULATORY_CARE_PROVIDER_SITE_OTHER): Payer: Medicare Other | Admitting: Pharmacist

## 2015-05-08 DIAGNOSIS — Z7901 Long term (current) use of anticoagulants: Secondary | ICD-10-CM

## 2015-05-08 DIAGNOSIS — I8291 Chronic embolism and thrombosis of unspecified vein: Secondary | ICD-10-CM

## 2015-05-08 DIAGNOSIS — I8290 Acute embolism and thrombosis of unspecified vein: Secondary | ICD-10-CM

## 2015-05-08 LAB — POCT INR: INR: 1.3

## 2015-05-08 MED ORDER — GABAPENTIN 800 MG PO TABS: ORAL_TABLET | ORAL | Status: DC

## 2015-05-08 NOTE — Patient Instructions (Signed)
Patient instructed to take medications as defined in the Anti-coagulation Track section of this encounter.  Patient instructed to take today's dose.  Patient verbalized understanding of these instructions.    

## 2015-05-08 NOTE — Progress Notes (Signed)
Anti-Coagulation Progress Note  Alisha [name] is a 54 y.o. female who is currently on an anti-coagulation regimen.    RECENT RESULTS: Recent results are below, the most recent result is correlated with a dose of 60 mg. per week: Lab Results  Component Value Date   INR 1.30 05/08/2015   INR 2.90 04/24/2015   INR 1.70 02/27/2015    ANTI-COAG DOSE:    ANTICOAG SUMMARY: Anticoagulation Episode Summary    Current INR goal 2.0-3.0  Next INR check 05/08/2015  INR from last check 2.90 (04/24/2015)  Most recent INR 1.30! (05/08/2015)  Weekly max dose   Target end date   [facility]  Preferred lab   Send INR reminders to    Indications  Venous thromboembolism [I82.90]        Comments         ANTICOAG TODAY: Anticoagulation Summary as of 05/08/2015    INR goal 2.0-3.0  Selected INR   Next INR check   Target end date    Indications  Venous thromboembolism [I82.90]      Anticoagulation Episode Summary    [facility]   Preferred lab    Send INR reminders to    Comments       PATIENT INSTRUCTIONS: There are no Patient Instructions on file for this visit.   FOLLOW-UP Return in about 2 weeks (around 05/22/2015) for Follow up INR at 1115h.  Jorene Guest, III Pharm.D., CACP

## 2015-05-08 NOTE — Progress Notes (Signed)
INTERNAL MEDICINE TEACHING ATTENDING ADDENDUM - Marketta Valadez M.D  Duration- lifelong, Indication- recurrent VTE, INR- subtherapeutic. Pharmacy to make recommendations regarding warfarin dosage

## 2015-05-18 ENCOUNTER — Ambulatory Visit (INDEPENDENT_AMBULATORY_CARE_PROVIDER_SITE_OTHER): Payer: Medicare Other | Admitting: Internal Medicine

## 2015-05-18 ENCOUNTER — Encounter: Payer: Self-pay | Admitting: Internal Medicine

## 2015-05-18 VITALS — BP 198/115 | HR 63 | Temp 98.1°F | Ht 64.5 in | Wt 170.3 lb

## 2015-05-18 DIAGNOSIS — M170 Bilateral primary osteoarthritis of knee: Secondary | ICD-10-CM | POA: Diagnosis not present

## 2015-05-18 DIAGNOSIS — I160 Hypertensive urgency: Secondary | ICD-10-CM

## 2015-05-18 DIAGNOSIS — G894 Chronic pain syndrome: Secondary | ICD-10-CM

## 2015-05-18 DIAGNOSIS — I10 Essential (primary) hypertension: Secondary | ICD-10-CM | POA: Diagnosis not present

## 2015-05-18 MED ORDER — KETOROLAC TROMETHAMINE 30 MG/ML IM SOLN: 30.0000 mg | Freq: Once | INTRAMUSCULAR | Status: DC

## 2015-05-18 MED ORDER — KETOROLAC TROMETHAMINE 30 MG/ML IM SOLN
30.0000 mg | Freq: Once | INTRAMUSCULAR | Status: AC
  Administered 2015-05-18: 30 mg via INTRAMUSCULAR

## 2015-05-18 MED ORDER — TRAMADOL HCL 50 MG PO TABS: 50.0000 mg | ORAL_TABLET | Freq: Four times a day (QID) | ORAL | Status: DC | PRN

## 2015-05-18 NOTE — Patient Instructions (Signed)
Make sure to take your blood pressure medication everyday. We will need to see you tomorrow to check your blood pressure.   If you notice worsening headache, blurry vision, pain behind your eyes, nausea, or vomiting go to the ED or call the clinic.

## 2015-05-18 NOTE — Progress Notes (Signed)
   Subjective:    Patient ID: Alisha [name], female    DOB: May 15, 1961, 54 y.o.   MRN: [id number]  HPI Pt is a 54  Y/o female w/ PMHx of recurrent DVT, HTN, chronic pain syndrome, OA, and depression who presents to clinic for pain clinic referral and blood clots in leg. Please see problem list for further details.      Review of Systems  Constitutional: Negative for fever, activity change and fatigue.  Eyes: Negative for visual disturbance.  Respiratory: Negative for shortness of breath.   Cardiovascular: Negative for chest pain and leg swelling.  Musculoskeletal: Positive for arthralgias and gait problem.  Neurological: Positive for headaches (left frontal HA).  Psychiatric/Behavioral: The patient is nervous/anxious.        Objective:   Physical Exam  Constitutional: She appears well-developed and well-nourished.  HENT:  Head: Normocephalic.  Eyes: Conjunctivae and EOM are normal.  Cardiovascular: Normal rate and regular rhythm.   Pulmonary/Chest: Effort normal and breath sounds normal.  Abdominal: Soft. Bowel sounds are normal.  Musculoskeletal:  Palpable cord like hematoma on lateral rt side below nipple line w/o any skin changes, non tender to palpation. B/l LE equal in size, non erythematous, non tender to palpation, neg for edema.    Skin: Skin is warm and dry. She is not diaphoretic.  Psychiatric:  Labile affect, tearful in exam room, anxious, child like voice           Assessment & Plan:  Please see problem based charting.

## 2015-05-19 ENCOUNTER — Ambulatory Visit (INDEPENDENT_AMBULATORY_CARE_PROVIDER_SITE_OTHER): Payer: Medicare Other | Admitting: Internal Medicine

## 2015-05-19 ENCOUNTER — Encounter: Payer: Self-pay | Admitting: Internal Medicine

## 2015-05-19 VITALS — BP 129/87 | HR 66 | Temp 97.5°F | Ht 65.0 in | Wt 168.0 lb

## 2015-05-19 DIAGNOSIS — I10 Essential (primary) hypertension: Secondary | ICD-10-CM | POA: Diagnosis present

## 2015-05-19 NOTE — Assessment & Plan Note (Signed)
BP Readings from Last 3 Encounters:  05/19/15 129/87  05/18/15 198/115  02/15/15 143/97    Lab Results  Component Value Date   NA 139 02/12/2015   K 3.9 02/12/2015   CREATININE 0.77 02/12/2015    Assessment: Blood pressure control:  controlled Progress toward BP goal:   at goal Comments: Seen yesterday and BP was elevated at 198/115 up to SBP in the 220's. Likely due to pain, today BP is well controlled.  Plan: Medications:  continue current medications Other plans: f/u 1 month

## 2015-05-19 NOTE — Progress Notes (Signed)
Internal Medicine Clinic Attending  Case discussed with Dr. Truong at the time of the visit.  We reviewed the resident's history and exam and pertinent patient test results.  I agree with the assessment, diagnosis, and plan of care documented in the resident's note.  

## 2015-05-19 NOTE — Assessment & Plan Note (Addendum)
BP Readings from Last 3 Encounters:  05/18/15 198/115  02/15/15 143/97  02/10/15 111/69    Lab Results  Component Value Date   NA 139 02/12/2015   K 3.9 02/12/2015   CREATININE 0.77 02/12/2015    Assessment: Blood pressure control:  not controlled Progress toward BP goal:   deteriorated  Comments: Pt on lisinopril 20mg  BID, HCTZ 12.5 mg daily, and coreg 25mg  BID which she states she took this morning. Pt is tearful in exam room stating she is anxious and in pain. Repeat BP readings 222/130. Pt unable to relax hand during BP readings.   Plan: Medications:  continue current medications Educational resources provided: brochure Self management tools provided: home blood pressure logbook Other plans: Pt denies blurry vision and does not appear to have htn emergency. She does have a left frontal HA x 3 days which may be related to elevated blood pressure. Unsure if pt actually took blood pressure meds this morning. Given toradol 30mg  IV injection to help control pain however BP was still elevated with SBP in the low 200's. Pt anxious to go home and afraid her ride is going to leave her. Toradol injection helped with pain and pt is willing to come back the next day for BP check. Will f/u on 5/20, can increase HCTZ or lisinopril if BP still elevated. Educated on when to go to the ED if BP remain elevated.

## 2015-05-19 NOTE — Progress Notes (Signed)
   Subjective:    Patient ID: [name], female    DOB: 10-12-1961, 54 y.o.   MRN: [id number]  HPI Pt is a 54 y/o female w/ PMHx of HTN, DM, and OA who presents to clinic for 1 day f/u on elevated BPs. Please see problem list for further details.     Review of Systems  Constitutional: Negative for activity change.  Eyes: Negative for visual disturbance.  Respiratory: Negative for shortness of breath.   Cardiovascular: Negative for chest pain.  Neurological: Negative for headaches.  Psychiatric/Behavioral: Negative for agitation.       Objective:   Physical Exam  Constitutional: She appears well-developed and well-nourished. No distress.  HENT:  Head: Normocephalic.  Cardiovascular: Normal rate and regular rhythm.   Pulmonary/Chest: Effort normal and breath sounds normal.  Abdominal: Soft. Bowel sounds are normal.  Skin: Skin is warm and dry.          Assessment & Plan:  Please see problem based assessment and plan.

## 2015-05-19 NOTE — Patient Instructions (Signed)
Your blood pressure was excellent today! Make sure to take all your medications as directed.

## 2015-05-19 NOTE — Assessment & Plan Note (Signed)
Refilled tramadol 50mg   q6h prn for pain. Referred to [facility] for pain management.

## 2015-05-22 ENCOUNTER — Ambulatory Visit (INDEPENDENT_AMBULATORY_CARE_PROVIDER_SITE_OTHER): Payer: Medicare Other | Admitting: Pharmacist

## 2015-05-22 DIAGNOSIS — Z7901 Long term (current) use of anticoagulants: Secondary | ICD-10-CM | POA: Diagnosis not present

## 2015-05-22 DIAGNOSIS — I8290 Acute embolism and thrombosis of unspecified vein: Secondary | ICD-10-CM | POA: Diagnosis present

## 2015-05-22 LAB — POCT INR: INR: 1.9

## 2015-05-22 NOTE — Progress Notes (Signed)
CLINICAL PHARMACIST ANTICOAG NOTE LATROYA NG is a 54 y.o. female who reports to the clinic for monitoring of warfarin treatment.    Indication: History of venous thromboembolism Duration: indefinite  Anticoagulation Clinic Visit History: Anticoagulation Episode Summary    Current INR goal 2.0-3.0  Next INR check 05/30/2015  INR from last check 1.9! (05/22/2015)  Weekly max dose   Target end date   [facility]  Preferred lab   Send INR reminders to    Indications  Venous thromboembolism [I82.90]        Comments        ASSESSMENT Recent Results: Recent results are below, the most recent result is correlated with a dose of 60 mg per week: Lab Results  Component Value Date   INR 1.9 05/22/2015   INR 1.30 05/08/2015   INR 2.90 04/24/2015    INR today: Subtherapeutic  Anticoagulation Dosing: INR as of 05/22/2015 and Previous Dosing Information    INR Dt INR Goal [employer] Sun Mon Tue Wed Thu Fri Sat   05/22/2015 1.9 2.0-3.0 60 mg 11.25 mg 7.5 mg 7.5 mg 11.25 mg 7.5 mg 7.5 mg 7.5 mg    Anticoagulation Dose Instructions as of 05/22/2015      Total Sun Mon Tue Wed Thu Fri Sat   New Dose 60 mg 11.25 mg 7.5 mg 7.5 mg 11.25 mg 7.5 mg 7.5 mg 7.5 mg     (7.5 mg x 1.5)  (7.5 mg x 1)  (7.5 mg x 1)  (7.5 mg x 1.5)  (7.5 mg x 1)  (7.5 mg x 1)  (7.5 mg x 1)                           PLAN Weekly dose was unchanged.  Patient Instructions  Patient educated about medication as defined in this encounter and verbalized understanding by repeating back instructions provided.    Follow-up Return in 8 days (on 05/30/2015) for follow-up INR on 05/30/2015 @ 10:00 AM.  Carolin Coy  PharmD Candidate  Flossie Dibble Clinical Pharmacist  15 minutes spent face-to-face with the patient during the encounter. 50% of time spent on education. 50% of time was spent on testing and assessment.

## 2015-05-22 NOTE — Patient Instructions (Signed)
Patient educated about medication as defined in this encounter and verbalized understanding by repeating back instructions provided.   

## 2015-05-30 ENCOUNTER — Ambulatory Visit: Payer: Medicare Other | Admitting: Pharmacist

## 2015-06-02 ENCOUNTER — Other Ambulatory Visit: Payer: Self-pay | Admitting: Internal Medicine

## 2015-06-05 ENCOUNTER — Ambulatory Visit (INDEPENDENT_AMBULATORY_CARE_PROVIDER_SITE_OTHER): Payer: Medicare Other | Admitting: Pharmacist

## 2015-06-05 DIAGNOSIS — Z7901 Long term (current) use of anticoagulants: Secondary | ICD-10-CM

## 2015-06-05 DIAGNOSIS — I8290 Acute embolism and thrombosis of unspecified vein: Secondary | ICD-10-CM | POA: Diagnosis present

## 2015-06-05 LAB — POCT INR: INR: 1.6

## 2015-06-05 NOTE — Patient Instructions (Signed)
Patient instructed to take medications as defined in the Anti-coagulation Track section of this encounter.  Patient instructed to take today's dose.  Patient verbalized understanding of these instructions.    

## 2015-06-05 NOTE — Progress Notes (Signed)
Anti-Coagulation Progress Note  Alisha [name] is a 54 y.o. female who is currently on an anti-coagulation regimen.    RECENT RESULTS: Recent results are below, the most recent result is correlated with a dose of 60 mg. per week with large consumption of vitamin K containing foods: Lab Results  Component Value Date   INR 1.60 06/05/2015   INR 1.9 05/22/2015   INR 1.30 05/08/2015    ANTI-COAG DOSE: Anticoagulation Dose Instructions as of 06/05/2015      Dorene Grebe Tue Wed Thu Fri Sat   New Dose 11.25 mg 11.25 mg 11.25 mg 11.25 mg 11.25 mg 7.5 mg 11.25 mg       ANTICOAG SUMMARY: Anticoagulation Episode Summary    Current INR goal 2.0-3.0  Next INR check 06/12/2015  INR from last check 1.60! (06/05/2015)  Weekly max dose   Target end date   [facility]  Preferred lab   Send INR reminders to    Indications  Venous thromboembolism [I82.90]        Comments         ANTICOAG TODAY: Anticoagulation Summary as of 06/05/2015    INR goal 2.0-3.0  Selected INR 1.60! (06/05/2015)  Next INR check 06/12/2015  Target end date    Indications  Venous thromboembolism [I82.90]      Anticoagulation Episode Summary    [facility]   Preferred lab    Send INR reminders to    Comments       PATIENT INSTRUCTIONS: Patient Instructions  Patient instructed to take medications as defined in the Anti-coagulation Track section of this encounter.  Patient instructed to take today's dose.  Patient verbalized understanding of these instructions.       FOLLOW-UP Return in 7 days (on 06/12/2015) for Follow up INR at 1000h.  Jorene Guest, III Pharm.D., CACP

## 2015-06-12 ENCOUNTER — Ambulatory Visit: Payer: Medicare Other

## 2015-06-14 ENCOUNTER — Encounter: Payer: Self-pay | Admitting: *Deleted

## 2015-06-23 ENCOUNTER — Encounter: Payer: Self-pay | Admitting: *Deleted

## 2015-07-07 DIAGNOSIS — F333 Major depressive disorder, recurrent, severe with psychotic symptoms: Secondary | ICD-10-CM | POA: Diagnosis not present

## 2015-07-10 ENCOUNTER — Ambulatory Visit (INDEPENDENT_AMBULATORY_CARE_PROVIDER_SITE_OTHER): Payer: Medicare Other | Admitting: Pharmacist

## 2015-07-10 DIAGNOSIS — I8290 Acute embolism and thrombosis of unspecified vein: Secondary | ICD-10-CM

## 2015-07-10 DIAGNOSIS — Z7901 Long term (current) use of anticoagulants: Secondary | ICD-10-CM | POA: Diagnosis not present

## 2015-07-10 LAB — POCT INR: INR: 2.3

## 2015-07-10 MED ORDER — WARFARIN SODIUM 7.5 MG PO TABS: 7.5000 mg | ORAL_TABLET | Freq: Every day | ORAL | Status: DC

## 2015-07-10 NOTE — Progress Notes (Signed)
Anti-Coagulation Progress Note  Alisha [name] is a 54 y.o. female who is currently on an anti-coagulation regimen.    RECENT RESULTS: Recent results are below, the most recent result is correlated with a dose of 75 mg. per week: Lab Results  Component Value Date   INR 2.30 07/10/2015   INR 1.60 06/05/2015   INR 1.9 05/22/2015    ANTI-COAG DOSE: Anticoagulation Dose Instructions as of 07/10/2015      Dorene Grebe Tue Wed Thu Fri Sat   New Dose 11.25 mg 11.25 mg 11.25 mg 11.25 mg 11.25 mg 7.5 mg 11.25 mg       ANTICOAG SUMMARY: Anticoagulation Episode Summary    Current INR goal 2.0-3.0  Next INR check 07/31/2015  INR from last check 2.30 (07/10/2015)  Weekly max dose   Target end date   [facility]  Preferred lab   Send INR reminders to    Indications  Venous thromboembolism [I82.90]        Comments         ANTICOAG TODAY: Anticoagulation Summary as of 07/10/2015    INR goal 2.0-3.0  Selected INR 2.30 (07/10/2015)  Next INR check 07/31/2015  Target end date    Indications  Venous thromboembolism [I82.90]      Anticoagulation Episode Summary    [facility]   Preferred lab    Send INR reminders to    Comments       PATIENT INSTRUCTIONS: Patient Instructions  Patient instructed to take medications as defined in the Anti-coagulation Track section of this encounter.  Patient instructed to take today's dose.  Patient verbalized understanding of these instructions.       FOLLOW-UP Return in 3 weeks (on 07/31/2015) for Follow up INR at 1045h.  Jorene Guest, III Pharm.D., CACP

## 2015-07-10 NOTE — Addendum Note (Signed)
Addended by: Jorene Guest B on: 07/10/2015 12:17 PM   Modules accepted: Orders

## 2015-07-10 NOTE — Patient Instructions (Signed)
Patient instructed to take medications as defined in the Anti-coagulation Track section of this encounter.  Patient instructed to take today's dose.  Patient verbalized understanding of these instructions.    

## 2015-07-19 NOTE — Addendum Note (Signed)
Addended by: Hulan Fray on: 07/19/2015 07:09 PM   Modules accepted: Orders

## 2015-08-08 ENCOUNTER — Telehealth: Payer: Self-pay | Admitting: Internal Medicine

## 2015-08-08 NOTE — Telephone Encounter (Signed)
Call to patient to confirm appointment for 08/09/15 at 2:15 mail box is full

## 2015-08-09 ENCOUNTER — Encounter: Payer: Medicare Other | Admitting: Internal Medicine

## 2015-08-14 ENCOUNTER — Ambulatory Visit: Payer: Medicare Other

## 2015-08-15 ENCOUNTER — Telehealth: Payer: Self-pay | Admitting: *Deleted

## 2015-08-15 ENCOUNTER — Telehealth: Payer: Self-pay | Admitting: Internal Medicine

## 2015-08-15 NOTE — Telephone Encounter (Signed)
Thank you Edd Fabian. I agree with your recommendations

## 2015-08-15 NOTE — Telephone Encounter (Signed)
Pt states that her legs are both numb and in pain. Said she can not even walk because she can not lift her legs.

## 2015-08-15 NOTE — Telephone Encounter (Signed)
Called and talked with pt.  I gave her an appointment for tomorrow but advised ED today for pain control.

## 2015-08-15 NOTE — Telephone Encounter (Signed)
Pt called stating she is having severe pain to left leg where she had bypass in 2008.  She states this happens on and off, seems to build up to high level of pain. This is not new but pain meds are not helping.  She is on Neurontin and tramadol.  She feels like she has weights in her legs.  She is tearful and states she has been told there is nothing that can be done for this pain.  She is waiting to get in to the pain clinic.   Pt given an appointment for tomorrow with PCP at 1:15 but advised to go to ED if pain continues.  She also states her legs are swollen but again this is not new.

## 2015-08-16 ENCOUNTER — Ambulatory Visit (INDEPENDENT_AMBULATORY_CARE_PROVIDER_SITE_OTHER): Payer: Medicare Other | Admitting: Internal Medicine

## 2015-08-16 ENCOUNTER — Other Ambulatory Visit: Payer: Self-pay | Admitting: Internal Medicine

## 2015-08-16 ENCOUNTER — Encounter: Payer: Self-pay | Admitting: Internal Medicine

## 2015-08-16 VITALS — BP 186/141 | HR 75 | Temp 97.6°F | Ht 64.5 in | Wt 176.3 lb

## 2015-08-16 DIAGNOSIS — I739 Peripheral vascular disease, unspecified: Secondary | ICD-10-CM | POA: Diagnosis not present

## 2015-08-16 DIAGNOSIS — F17210 Nicotine dependence, cigarettes, uncomplicated: Secondary | ICD-10-CM | POA: Diagnosis not present

## 2015-08-16 DIAGNOSIS — I10 Essential (primary) hypertension: Secondary | ICD-10-CM

## 2015-08-16 DIAGNOSIS — G894 Chronic pain syndrome: Secondary | ICD-10-CM

## 2015-08-16 DIAGNOSIS — F329 Major depressive disorder, single episode, unspecified: Secondary | ICD-10-CM | POA: Diagnosis not present

## 2015-08-16 DIAGNOSIS — F17200 Nicotine dependence, unspecified, uncomplicated: Secondary | ICD-10-CM

## 2015-08-16 DIAGNOSIS — F32A Depression, unspecified: Secondary | ICD-10-CM

## 2015-08-16 MED ORDER — HYDROCHLOROTHIAZIDE 25 MG PO TABS: 25.0000 mg | ORAL_TABLET | Freq: Every day | ORAL | Status: DC

## 2015-08-16 MED ORDER — TRAMADOL HCL 50 MG PO TABS: 50.0000 mg | ORAL_TABLET | Freq: Four times a day (QID) | ORAL | Status: DC | PRN

## 2015-08-16 MED ORDER — GABAPENTIN 800 MG PO TABS: ORAL_TABLET | ORAL | Status: DC

## 2015-08-16 MED ORDER — DICLOFENAC SODIUM 1 % TD GEL: 2.0000 g | Freq: Four times a day (QID) | TRANSDERMAL | Status: DC

## 2015-08-16 NOTE — Assessment & Plan Note (Signed)
Counseled on smoking cessation. Pt tearful and agitated due to LE pain and unwilling to quantify how many cigarettes she smokes daily. States she smokes as many as she can get her hands on. States that smoking helps with her pain. She does not seem to understand the association with tobacco abuse and progression of her PVD. She quit smoking for 3 years after her lt LE bypass graft in 2009 and states she did not notice a difference in her symptoms.  - educated on association of smoking w/ worsening of PVD as well as other implications such as lung disease.  - will continue to follow willingness to quit.

## 2015-08-16 NOTE — Assessment & Plan Note (Addendum)
Pt complaining of 10/10 b/l LE pain, left greater than right. This is chronic and has been going on since her femoral popliteal bypass graft in 2009. She is tearful and angry because she had an active lifestyle prior to this sx and feels that her surgery was botched leading to her current chronic pain and disability. Pain is constant and she cannot name any aggravating factors. She has tried lidoderm patches and voltaren ointment which have not helped. She was prescribed tramadol in 04/2015 which gave her relief but that it was minimal. She has numbness of her LEs and has a walker and a wheel chair at home which she uses to get around. Denies any new changes in her mobility, she did fall last week with a resolving bruise on her rt forearm on exam. She last saw Dr. 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 He felt that her chronic LE pain was not consistent with claudication and recommended repeat ABIs in 6 months. However pt has not followed up with Dr. Oneida Alar and states she was told to find another physician. She was not a candidate for bypass graft due to tobacco abuse and obesity. Work up to date has been negative for other causes of her pain including negative anti cardiolipin ab, ANA, ESR, CRP, and hypercoagulable panel. She does not have evidence of digital ischemia on exam. CTA of aorta and ileofemoral runoff in 01/2015 was neg for corkscrew collaterals that can be seen w/ Buerger's disease. Physical exam neg for distal digit ischemia.   - ordered ABIs - referral to neurology for NCS/EMG to further investigate pain - referral to [facility] - refilled tramadol and informed patient that we will not refill this med again. She was referred to pain management clinic and her chart is currently under review per Lela. Ensured her correct telephone number is in EPIC.  - refilled voltaren gel, patient states she is willing to try this again.  - counseled on  smoking cessation however she is unwilling to cut back at this time, states she quit for 3 years after her sx in 2009 and did not notice any difference in symptoms. Will continue to follow willingness to quit and continue tobacco cessation education - can consider CTA of LE to further work up possiblity of Buerger's disease.

## 2015-08-16 NOTE — Assessment & Plan Note (Signed)
BP Readings from Last 3 Encounters:  08/16/15 186/141  05/19/15 129/87  05/18/15 198/115    Lab Results  Component Value Date   NA 139 02/12/2015   K 3.9 02/12/2015   CREATININE 0.77 02/12/2015    Assessment: Blood pressure control:  uncontrolled, labile due to pain and anxiety Progress toward BP goal:     Unclear Comments: BP 186/141 on presentation, when checked manually in the room it was 130/100. Pt states she took her BP meds this morning. She is also on lisinopril 20mg  BID which she takes everyday that was not on her med list. During exam patient is tearful, agitated, and states she is in pain.   Plan: Medications:  Added lisinopril 20mg  BID to her med list. Continue all meds and increased HCTZ to 25mg  daily. She is on potassium supplements.  Educational resources provided:   Self management tools provided:   Other plans: f/u in 1 week for BP check

## 2015-08-16 NOTE — Patient Instructions (Signed)
I have increased your hydrochlorothiazide dose to 25mg  daily to better control your blood pressure. We we need to see you in 1 week to recheck your blood pressure.   You can use voltaren gel applied topically to both legs where you are having pain.

## 2015-08-16 NOTE — Progress Notes (Signed)
   Subjective:    Patient ID: Alisha [name], female    DOB: 07/01/1961, 54 y.o.   MRN: [id number]  HPI Pt is a 54 y/o female w/ PMHx of HTN, DM, PVD, and OA who presents to clinic LE pain. Please see problem list for further details.      Review of Systems  Constitutional: Negative for fever and activity change.  Eyes: Visual disturbance: due to crying.       Eye swelling due to crying   Cardiovascular: Negative for chest pain.  Gastrointestinal: Negative for nausea, vomiting and abdominal pain.  Genitourinary: Negative for difficulty urinating.  Musculoskeletal: Positive for myalgias and gait problem.  Psychiatric/Behavioral: Negative for dysphoric mood.       Objective:   Physical Exam  Constitutional: She appears well-developed and well-nourished.  Eyes: Conjunctivae are normal. Pupils are equal, round, and reactive to light. No scleral icterus.  Cardiovascular: Normal rate and regular rhythm.   Pulmonary/Chest: Effort normal and breath sounds normal. No respiratory distress. She has no wheezes.  Abdominal: Soft. Bowel sounds are normal. There is no tenderness.  Musculoskeletal: She exhibits no edema.  Calves equal in size, no reddness or warmth, neg for pedal edema, unable to palpate DP or TP pulses b/l. She is able to ambulate without assistance. She has difficulty walking on her tip toes. Neg for digital ischemia   Neurological: She is alert.  Skin: Skin is warm and dry.  Blue/purple bruise on rt forearm with palpable non tender knot near the center, 1 inch in length raise nodule on rt lateral back near midaxillary border that is non tender, non erythematous and unchanged from exam during last visit, left lateral abdomen beneath left breast without any bruising or tenderness.           Assessment & Plan:  Please see problem based assessment and plan.

## 2015-08-16 NOTE — Assessment & Plan Note (Signed)
Follows w/ [facility] for [facility]. States mood has been good and well controlled with her current medications. She has been going to group therapy as well. Suspect a psychogenic component to her chronic LE pain.

## 2015-08-17 NOTE — Progress Notes (Signed)
Case discussed with Dr. Truong at the time of the visit.  We reviewed the resident's history and exam and pertinent patient test results.  I agree with the assessment, diagnosis and plan of care documented in the resident's note. 

## 2015-08-21 ENCOUNTER — Other Ambulatory Visit: Payer: Self-pay | Admitting: Internal Medicine

## 2015-08-21 ENCOUNTER — Ambulatory Visit (INDEPENDENT_AMBULATORY_CARE_PROVIDER_SITE_OTHER): Payer: Medicare Other | Admitting: Internal Medicine

## 2015-08-21 ENCOUNTER — Ambulatory Visit (INDEPENDENT_AMBULATORY_CARE_PROVIDER_SITE_OTHER): Payer: Medicare Other | Admitting: Pharmacist

## 2015-08-21 VITALS — BP 158/96 | HR 62 | Temp 97.7°F | Wt 173.9 lb

## 2015-08-21 DIAGNOSIS — Z7982 Long term (current) use of aspirin: Secondary | ICD-10-CM

## 2015-08-21 DIAGNOSIS — Z7901 Long term (current) use of anticoagulants: Secondary | ICD-10-CM | POA: Diagnosis not present

## 2015-08-21 DIAGNOSIS — I8290 Acute embolism and thrombosis of unspecified vein: Secondary | ICD-10-CM

## 2015-08-21 DIAGNOSIS — I10 Essential (primary) hypertension: Secondary | ICD-10-CM | POA: Diagnosis present

## 2015-08-21 DIAGNOSIS — F17210 Nicotine dependence, cigarettes, uncomplicated: Secondary | ICD-10-CM

## 2015-08-21 LAB — POCT INR: INR: 1.4

## 2015-08-21 MED ORDER — AMLODIPINE BESYLATE 2.5 MG PO TABS: 2.5000 mg | ORAL_TABLET | Freq: Every day | ORAL | Status: DC

## 2015-08-21 NOTE — Progress Notes (Signed)
Anti-Coagulation Progress Note  Alisha [name] is a 54 y.o. female who is currently on an anti-coagulation regimen.    RECENT RESULTS: Recent results are below, the most recent result is correlated with a dose of 75 mg. per week: Lab Results  Component Value Date   INR 1.40 08/21/2015   INR 2.30 07/10/2015   INR 1.60 06/05/2015    ANTI-COAG DOSE: Anticoagulation Dose Instructions as of 08/21/2015      Dorene Grebe Tue Wed Thu Fri Sat   New Dose 11.25 mg 15 mg 11.25 mg 11.25 mg 15 mg 11.25 mg 11.25 mg       ANTICOAG SUMMARY: Anticoagulation Episode Summary    Current INR goal 2.0-3.0  Next INR check 08/28/2015  INR from last check 1.40! (08/21/2015)  Weekly max dose   Target end date   [facility]  Preferred lab   Send INR reminders to    Indications  Venous thromboembolism [I82.90]        Comments         ANTICOAG TODAY: Anticoagulation Summary as of 08/21/2015    INR goal 2.0-3.0  Selected INR 1.40! (08/21/2015)  Next INR check 08/28/2015  Target end date    Indications  Venous thromboembolism [I82.90]      Anticoagulation Episode Summary    [facility]   Preferred lab    Send INR reminders to    Comments       PATIENT INSTRUCTIONS: Patient Instructions  Patient instructed to take medications as defined in the Anti-coagulation Track section of this encounter.  Patient instructed to take today's dose.  Patient verbalized understanding of these instructions.       FOLLOW-UP Return in 7 days (on 08/28/2015) for Follow up INR at 1000h.  Jorene Guest, III Pharm.D., CACP

## 2015-08-21 NOTE — Patient Instructions (Signed)
General Instructions: Thank you for coming in today.  1. I am adding Norvasc 2.5 mg to your BP medications. Please continue to take your medications as prescribed.  2. Follow up with your PCP at next available appointment.    Treatment Goals:  Goals (1 Years of Data) as of 08/21/15          As of Today 08/16/15 05/19/15 05/18/15 02/15/15     Blood Pressure   . Blood Pressure < 140/90  158/96 186/141 129/87 198/115 143/97      Progress Toward Treatment Goals:  Treatment Goal 02/10/2015  Blood pressure at goal  Stop smoking smoking the same amount  Prevent falls -    Self Care Goals & Plans:  Self Care Goal 08/16/2015  Manage my medications take my medicines as prescribed; bring my medications to every visit; refill my medications on time  Eat healthy foods drink diet soda or water instead of juice or soda; eat more vegetables; eat foods that are low in salt; eat baked foods instead of fried foods  Be physically active find an activity I enjoy  Stop smoking cut down the number of cigarettes smoked  Prevent falls use home fall prevention checklist to improve safety    No flowsheet data found.   Care Management & Community Referrals:  Referral 10/22/2013  Referrals made for care management support none needed

## 2015-08-21 NOTE — Progress Notes (Signed)
Patient ID: Alisha [name], female   DOB: [birth year], 54 y.o.   MRN: [id number]   Subjective:   Patient ID: Alisha [name] female   DOB: 1961/01/11 54 y.o.   MRN: [id number]  HPI: Alisha [name] is a 54 y.o. female with a PMH of HTN, DM, HLD, PVD, OA presents to clinic today for BP follow up.  For details of today's visit please refer to problem based charting.   Past Medical History  Diagnosis Date  . DVT (deep venous thrombosis)     bilateral, 2 episodes: Requires lifelong therapy  . Chronic pain syndrome   . Restless leg syndrome   . Depression   . Hyperlipidemia   . Hypertension   . PVD (peripheral vascular disease)     s/p L fem-pop bypass 11/21/08; graft occluded 12/28/08;  aortogram w/ bilat LE runoff: 1.  bilat diffuse SFA occlusive dz, 2.  Mod to severe above-knee popliteal dz,  3.  Bilat 3-vessel runoff w/ mild tibial occlusive dz.  . Tobacco abuse   . Degenerative joint disease of knee   . Breast lump 02/2008    Biopsy 05/2008: showed no evidence of malignancy  . Irregular menses 9/08  . Pulmonary edema   . Anxiety   . Joint pain   . Mixed stress and urge urinary incontinence     Being followed by [facility], underwent cystoscopy & uroflowmetry   . Endometrial mass 10/12/2012    Endometrium, biopsy on 11/04/12 - PROLIFERATIVE ENDOMETRIUM AND ABUNDANT MUCUS. NO HYPERPLASIA OR CARCINOMA.   . Sigmoid diverticulitis 10/26/2012  . Asthma   . Breast mass in female     bi lat    Current Outpatient Prescriptions  Medication Sig Dispense Refill  . albuterol (PROVENTIL HFA;VENTOLIN HFA) 108 (90 BASE) MCG/ACT inhaler Inhale 1-2 puffs into the lungs every 4 (four) hours as needed for wheezing or shortness of breath. 3.7 g 2  . amLODipine (NORVASC) 2.5 MG tablet Take 1 tablet (2.5 mg total) by mouth daily. 30 tablet 1  . aspirin EC 81 MG tablet Take 81 mg by mouth daily.    Marland Kitchen atorvastatin (LIPITOR) 40 MG tablet Take 1 tablet (40 mg total) by mouth at bedtime. 90 tablet 3  .  buPROPion (WELLBUTRIN XL) 300 MG 24 hr tablet Take 300 mg by mouth daily.    . carvedilol (COREG) 25 MG tablet TAKE 1 TABLET BY MOUTH TWICE DAILY WITH MEALS 180 tablet 0  . diazepam (VALIUM) 5 MG tablet Take 10 mg by mouth 3 (three) times daily.     . diclofenac sodium (VOLTAREN) 1 % GEL Apply 2 g topically 4 (four) times daily. 1 Tube 3  . DULoxetine (CYMBALTA) 60 MG capsule Take 60 mg by mouth daily.     Marland Kitchen gabapentin (NEURONTIN) 800 MG tablet TAKE 1 TABLET BY MOUTH IN THE MORNING, 1 TABLET BY MOUTH MIDDAY, AND 2 TABLETS BY MOUTH AT BEDTIME 360 tablet 4  . hydrochlorothiazide (HYDRODIURIL) 25 MG tablet TAKE 1 TABLET(25 MG) BY MOUTH DAILY 90 tablet 1  . lisinopril (PRINIVIL,ZESTRIL) 20 MG tablet Take 20 mg by mouth 2 (two) times daily.    . Multiple Vitamins-Minerals (ONE-A-DAY 50 PLUS PO) Take 1 tablet by mouth daily.    . potassium chloride (K-DUR) 10 MEQ tablet TAKE 1 TABLET BY MOUTH EVERY DAY 30 tablet 10  . traMADol (ULTRAM) 50 MG tablet Take 1 tablet (50 mg total) by mouth every 6 (six) hours as needed for severe pain. [place]  tablet 0  . warfarin (COUMADIN) 7.5 MG tablet Take 1 tablet (7.5 mg total) by mouth daily. Take 1&1/2 tablets on all days EXCEPT on Fridays--then, take only one tablet. 40 tablet 2  . ziprasidone (GEODON) 60 MG capsule Take 60 mg by mouth 2 (two) times daily with a meal.      No current facility-administered medications for this visit.   Family History  Problem Relation Age of Onset  . Breast cancer Mother   . Colon cancer Neg Hx   . Rectal cancer Neg Hx   . Stomach cancer Neg Hx   . Breast cancer Other     GREAT AUNT   Social History   Social History  . Marital Status: Divorced    Spouse Name: N/A  . Number of Children: 4  . Years of Education: N/A   Occupational History  . disable    Social History [name] Topics  . Smoking status: Current Every Day Smoker -- 0.50 packs/day for 18 years    Types: Cigarettes  . Smokeless tobacco: Never Used     Comment:  3-6 per day  . Alcohol Use: 0.0 oz/week    0 Standard drinks or equivalent per week     Comment: rare  . Drug Use: No     Comment: hx of marijuana and cocaine use  . Sexual Activity: No   Other Topics Concern  . Not on file   Social History Narrative   Financial assistance application initiated. Patient needs to submit further paperwork to complete   [facility]  September 11, 2010 2:04 PM   Financial assistance approved for 100% discount at [facility] and has [facility] card   [name]  October 04, 2010 5:29 PM   Review of Systems: Review of Systems  Constitutional: Negative for fever and chills.  Eyes: Positive for blurred vision.  Respiratory: Negative for shortness of breath.   Cardiovascular: Negative for chest pain.  Gastrointestinal: Positive for nausea. Negative for vomiting, abdominal pain, diarrhea and constipation.  Neurological: Positive for dizziness and headaches. Negative for weakness.   Objective:  Physical Exam: Filed Vitals:   08/21/15 1404  BP: 158/96  Pulse: 62  Temp: 97.7 F (36.5 C)  TempSrc: Oral  Weight: 173 lb 14.4 oz (78.881 kg)  SpO2: 100%   Constitutional: Vital signs reviewed.  Patient is a well-developed and well-nourished female in no acute distress and cooperative with exam. Alert and oriented x3.  Head: Normocephalic and atraumatic Eyes: PERRL, EOMI, conjunctivae normal, No scleral icterus.  Neck: Supple, Trachea midline normal ROM, No JVD, mass, thyromegaly, or carotid bruit present.  Cardiovascular: RRR, S1 normal, S2 normal, no MRG, pulses symmetric and intact bilaterally Pulmonary/Chest: normal respiratory effort, CTAB, no wheezes, rales, or rhonchi Abdominal: Soft. Non-tender, non-distended, bowel sounds are normal Musculoskeletal: No joint deformities, erythema, or stiffness, ROM full and no nontender.  Neurological: A&O x3, Strength is normal and symmetric bilaterally, cranial nerve II-XII are grossly intact Skin: Warm, dry and intact.  No rash, cyanosis, or clubbing.  Extremities: no pedal edema, unable to palpate DP or TP pulses bilaterally. Psychiatric: Normal mood and affect. speech and behavior is normal.  Assessment & Plan:   Case discussed with Dr. Eppie Gibson. Please refer to Problem based carting for further details of today's visit.

## 2015-08-21 NOTE — Progress Notes (Signed)
I saw and evaluated the patient. I personally confirmed the key portions of Dr. Boswell's history and exam and reviewed pertinent patient test results. The assessment, diagnosis, and plan were formulated together and I agree with the documentation in the resident's note. 

## 2015-08-21 NOTE — Patient Instructions (Signed)
Patient instructed to take medications as defined in the Anti-coagulation Track section of this encounter.  Patient instructed to take today's dose.  Patient verbalized understanding of these instructions.    

## 2015-08-21 NOTE — Assessment & Plan Note (Signed)
BP Readings from Last 3 Encounters:  08/21/15 158/96  08/16/15 186/141  05/19/15 129/87    Lab Results  Component Value Date   NA 139 02/12/2015   K 3.9 02/12/2015   CREATININE 0.77 02/12/2015    Assessment: Blood pressure control:  fair Progress toward BP goal:   improved Comments: Patient currently taking Lisinopril 20 mg bid, HCTZ 25 mg daily (increased from 12.5 mg at last visit), Coreg 25 mg bid. Patient reports being on amlodipine 10 mg in the past and believes that helped her BP control. Was taken off due to hypotension.   Plan: Medications:  Will add Norvasc 2.5 mg daily Educational resources provided:   Self management tools provided:   Other plans: Follow up with PCP at next available appointment. Titrate up the Norvasc as needed. Repeat BMP at next visit.

## 2015-08-21 NOTE — Progress Notes (Signed)
Indication: Recurrent venous thrombosis. Duration: Lifelong. INR: Below target. Agree with Dr. Gladstone Pih assessment and plan.

## 2015-08-28 ENCOUNTER — Ambulatory Visit: Payer: Medicare Other

## 2015-08-30 ENCOUNTER — Telehealth: Payer: Self-pay | Admitting: Internal Medicine

## 2015-08-30 NOTE — Telephone Encounter (Signed)
Call to patient to confirm appointment for 08/31/15 at 2:30 and 3:15 lmtcb

## 2015-08-31 ENCOUNTER — Encounter: Payer: Self-pay | Admitting: Internal Medicine

## 2015-08-31 ENCOUNTER — Ambulatory Visit: Payer: Medicare Other | Admitting: Internal Medicine

## 2015-08-31 ENCOUNTER — Ambulatory Visit: Payer: Medicare Other | Admitting: Pharmacist

## 2015-09-21 ENCOUNTER — Ambulatory Visit: Payer: Self-pay | Admitting: Neurology

## 2015-09-22 ENCOUNTER — Encounter: Payer: Self-pay | Admitting: *Deleted

## 2015-09-24 NOTE — Progress Notes (Signed)
No show

## 2015-09-25 ENCOUNTER — Encounter: Payer: Self-pay | Admitting: Neurology

## 2015-09-27 NOTE — Addendum Note (Signed)
Addended by: Hulan Fray on: 09/27/2015 06:56 PM   Modules accepted: Orders

## 2015-10-03 ENCOUNTER — Ambulatory Visit: Payer: Medicare Other | Admitting: Pharmacist

## 2015-10-04 ENCOUNTER — Telehealth: Payer: Self-pay | Admitting: Internal Medicine

## 2015-10-04 NOTE — Telephone Encounter (Signed)
Pt want a statement written by Dr. Hulen Luster with what her disability are. Please call pt back when it is ready.

## 2015-10-12 NOTE — Telephone Encounter (Signed)
Triage has attempted to call pt, if this statement is for disability application it is [facility] policy that [employer] will request records at the time of application and no other letter will be written, if it is for another matter pt will need to discuss that with dr Hulen Luster at a scheduled appt. If pt rtc she will be informed

## 2015-10-13 ENCOUNTER — Telehealth: Payer: Self-pay | Admitting: *Deleted

## 2015-10-13 NOTE — Telephone Encounter (Signed)
Pt called - this happened 72 hours ago - pt was in [place] during  the night and leg went out from pt. Pt fell and hit head on toilet. Talked with resident when it happed due to Coumadin. States her head is still hurting. Clinic has no open appt till 10/18/15 - suggest for pt to go to ER. Hilda Blades Leanah Kolander RN 10/13/15 11:40AM

## 2015-10-20 NOTE — Addendum Note (Signed)
Addended by: Hulan Fray on: 10/20/2015 04:46 PM   Modules accepted: Orders

## 2015-10-28 ENCOUNTER — Other Ambulatory Visit: Payer: Self-pay | Admitting: Internal Medicine

## 2015-11-09 ENCOUNTER — Ambulatory Visit (INDEPENDENT_AMBULATORY_CARE_PROVIDER_SITE_OTHER): Payer: Medicare Other | Admitting: Pharmacist

## 2015-11-09 DIAGNOSIS — Z7901 Long term (current) use of anticoagulants: Secondary | ICD-10-CM

## 2015-11-09 DIAGNOSIS — I8290 Acute embolism and thrombosis of unspecified vein: Secondary | ICD-10-CM

## 2015-11-09 DIAGNOSIS — F333 Major depressive disorder, recurrent, severe with psychotic symptoms: Secondary | ICD-10-CM | POA: Diagnosis not present

## 2015-11-09 LAB — POCT INR: INR: 1.3

## 2015-11-09 IMAGING — CT CT ABD-PELV W/ CM
2 of 4 series · 16 of 46 positions shown, 18 images · IV contrast (Omni 300)
Comparison: 02/09/2015, 02/07/2015

CLINICAL DATA: Low abdominal pain, drain placed for left lower
quadrant diverticular abscess 02/09/2015

EXAM:
CT ABDOMEN AND PELVIS WITH CONTRAST
TECHNIQUE: Multidetector CT imaging of the abdomen and pelvis was performed
using the standard protocol following bolus administration of
intravenous contrast.
CONTRAST:  100mL OMNIPAQUE IOHEXOL 300 MG/ML  SOLN

[Series 2: abd/ pelvis 5.0 i30f 1 · axial · 0.70mm/px · z∈[-681,-251]mm · 13 of 94 slices shown, 15 images]
[im 4/94  soft-tissue]
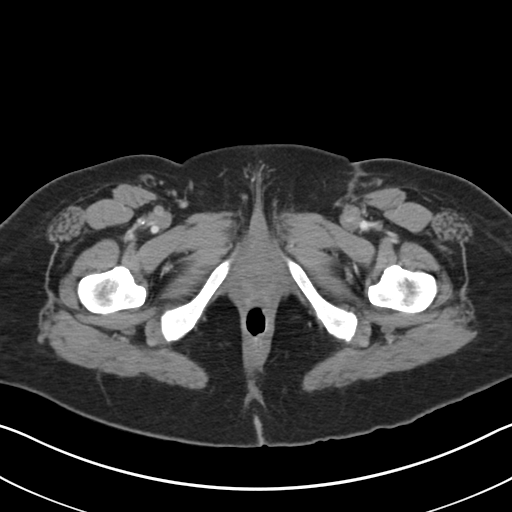
[im 4/94  bone]
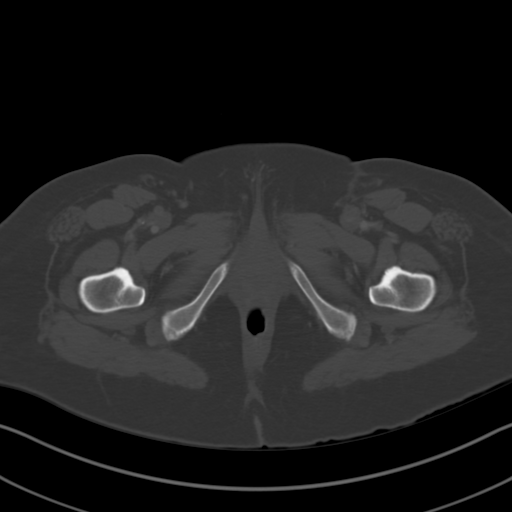
[im 12/94  soft-tissue]
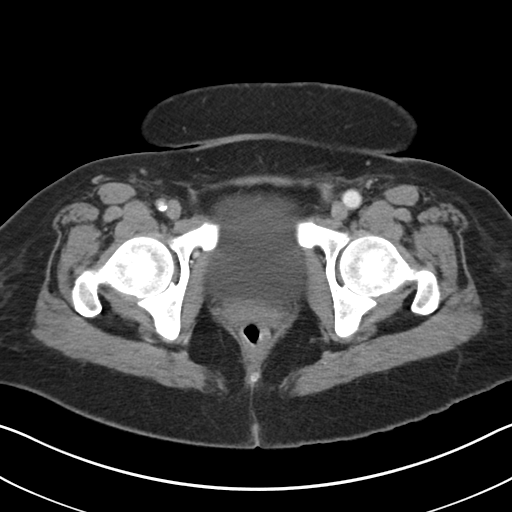
[im 19/94  soft-tissue]
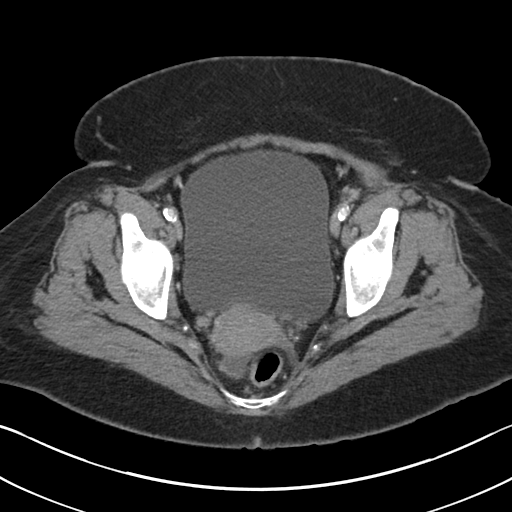
[im 27/94  soft-tissue]
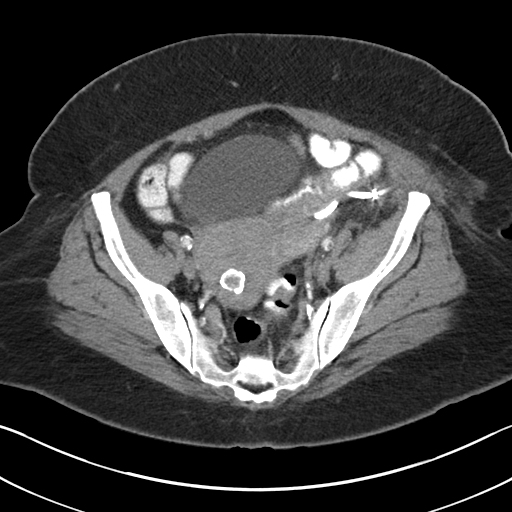
[im 34/94  soft-tissue]
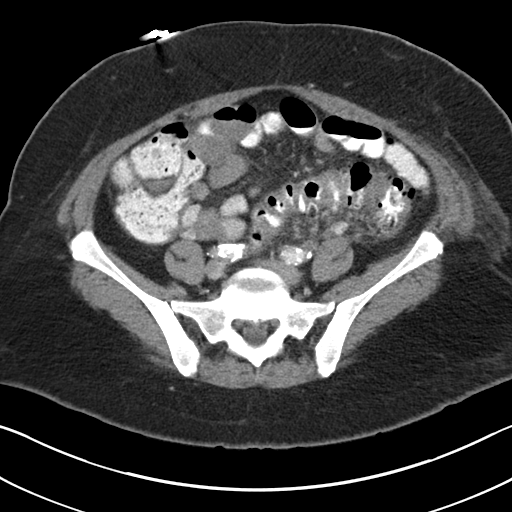
[im 41/94  soft-tissue]
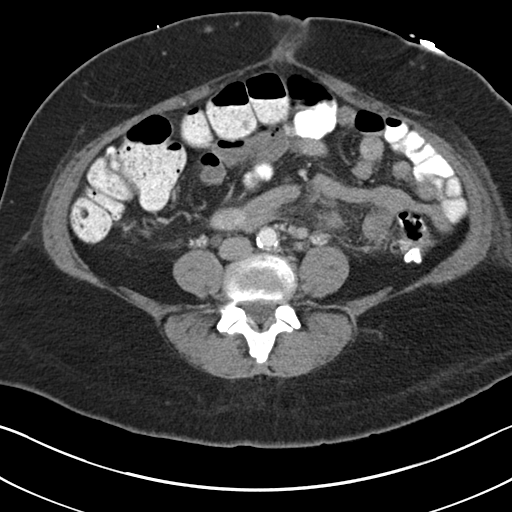
[im 49/94  soft-tissue]
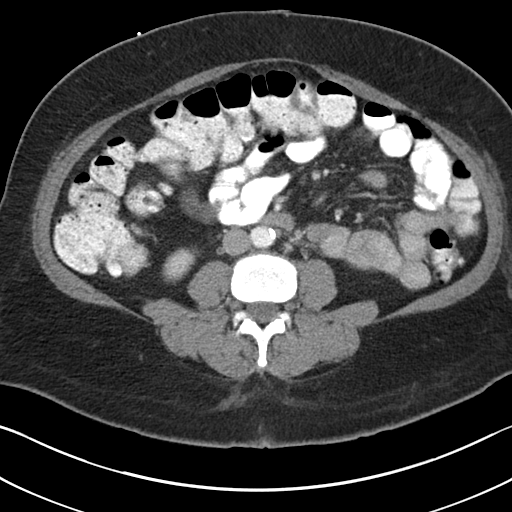
[im 53/94  soft-tissue]
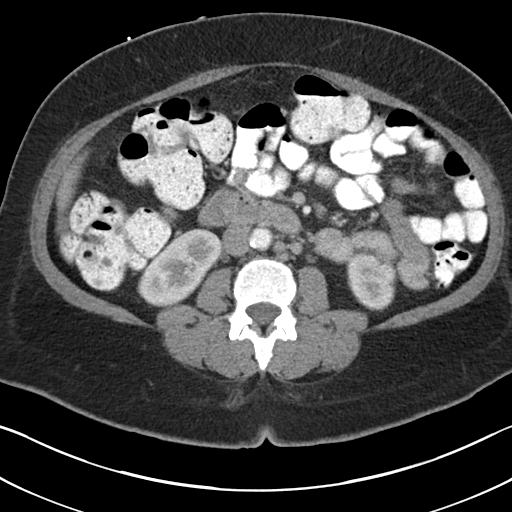
[im 60/94  soft-tissue]
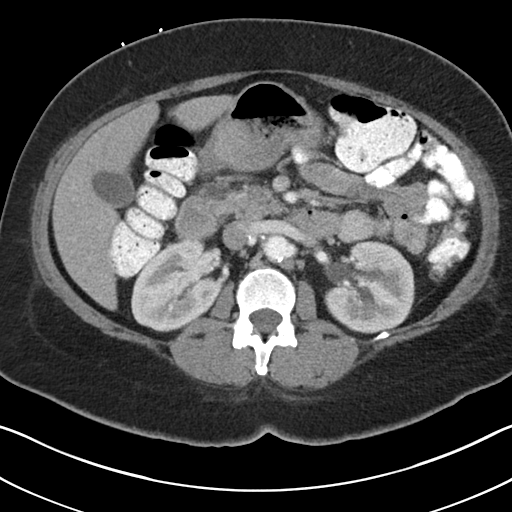
[im 60/94  bone]
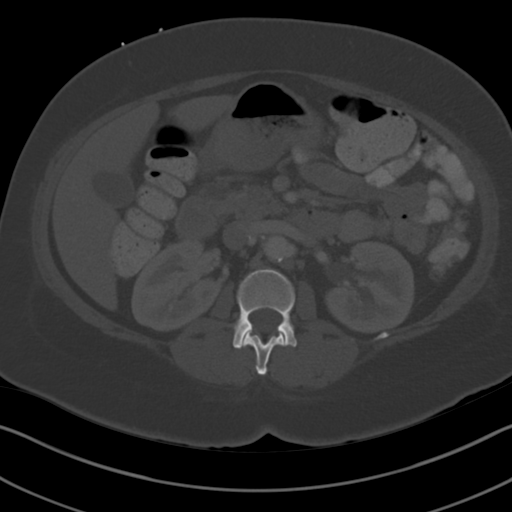
[im 67/94  soft-tissue]
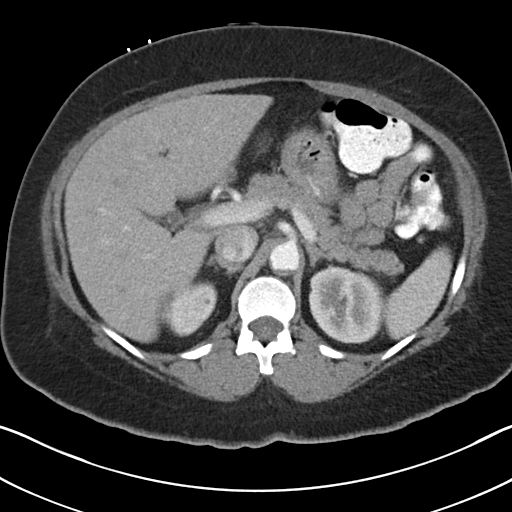
[im 75/94  soft-tissue]
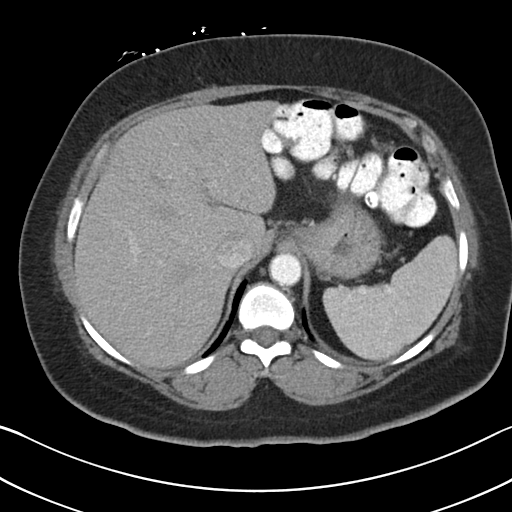
[im 82/94  soft-tissue]
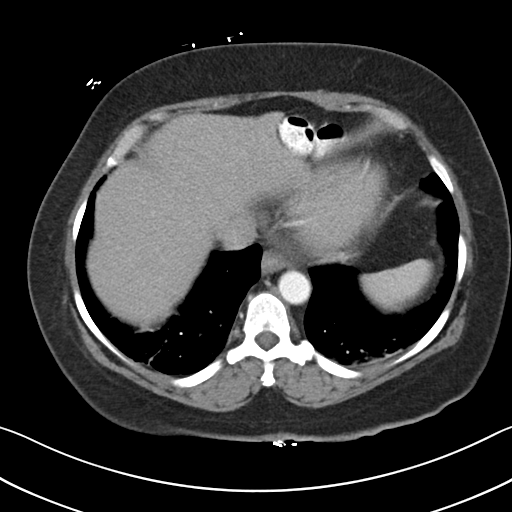
[im 90/94  soft-tissue]
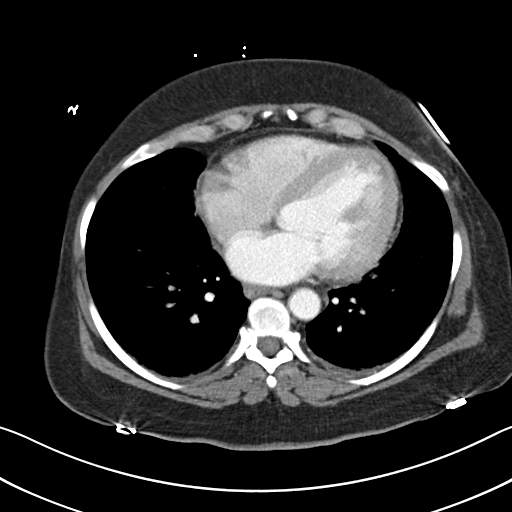

[Series 5: coronals · coronal · 0.69mm/px · 3 of 153 slices shown]
[im 51/153  soft-tissue]
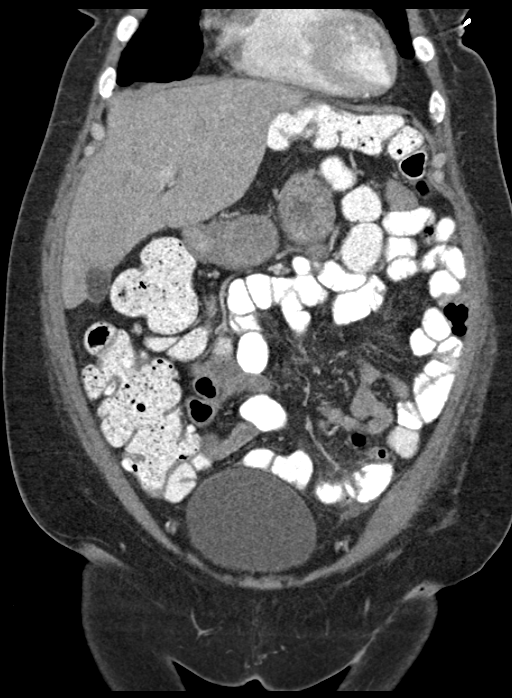
[im 68/153  soft-tissue]
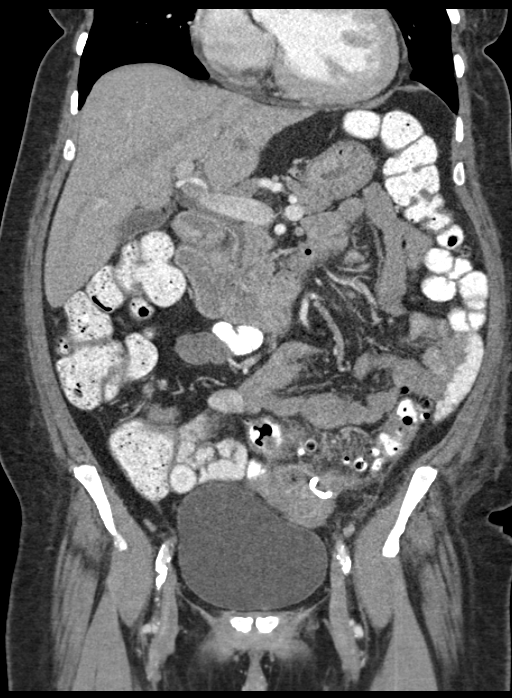
[im 85/153  soft-tissue]
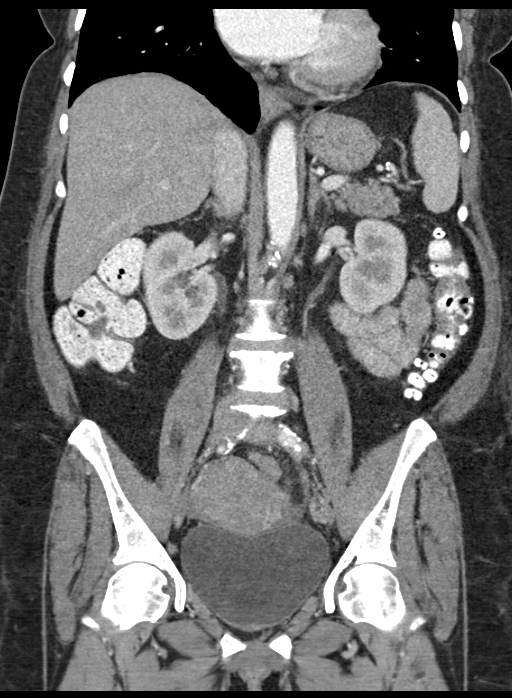

[16 of 46 positions shown; findings below may reference images not displayed]

FINDINGS: Lower chest: Dependent bibasilar atelectasis is slightly increased
since previously. Trace pleural fluid is now identified. Heart size
upper limits of normal with trace pericardial fluid.

Hepatobiliary: Liver and gallbladder are unremarkable.

Pancreas: Normal

Spleen: Normal

Adrenals/Urinary Tract: Adrenal glands and kidneys appear
unremarkable.

Stomach/Bowel: Again noted is short segmental rectosigmoid colonic
wall thickening with surrounding stranding and a few foci of
adjacent gas, compatible with diverticulitis as seen on the prior
exam. Within the left lower quadrant, there is a pigtail catheter in
place with interval decrease in size of rim enhancing gas/ fluid
collection compatible with improving diverticular abscess, now 2.8 x
2.3 cm image 66.

Vascular/Lymphatic: Moderate atheromatous aortic calcification noted
without aneurysm. Scattered subcentimeter retroperitoneal nodes are
reidentified, largest measuring 0.7 cm image 54.

Other: A small amount of pelvic free fluid is present. Presumed
calcified uterine fibroid noted. Right ovary appears normal. Left
ovary is not visualized.

Musculoskeletal: Disc degenerative change noted in the lower lumbar
spine. No acute osseous abnormality.
IMPRESSION: Significant interval decrease in left lower quadrant diverticular
abscess after drainage catheter placement. No new acute
intra-abdominal or pelvic pathology.

## 2015-11-09 NOTE — Progress Notes (Signed)
INTERNAL MEDICINE TEACHING ATTENDING ADDENDUM - Lalla Brothers M.D  Duration- indefinite, Indication- venous thrombosis, INR- below target. Agree with pharmacy recommendations as outlined in their note.

## 2015-11-09 NOTE — Patient Instructions (Signed)
Patient educated about medication as defined in this encounter and verbalized understanding by repeating back instructions provided.   

## 2015-11-09 NOTE — Progress Notes (Signed)
Anticoagulation Management Alisha [name] is a 54 y.o. female who reports to the clinic for monitoring of warfarin treatment.    Indication: DVThistory Duration: indefinite  Anticoagulation Clinic Visit History: Anticoagulation Episode Summary    Current INR goal 2.0-3.0  Next INR check 11/14/2015  INR from last check 1.3! (11/09/2015)  Weekly max dose   Target end date   [facility] location [facility]  Preferred lab   Send INR reminders to    Indications  Venous thromboembolism [I82.90]        Comments        ASSESSMENT Recent Results: Recent results are below, the most recent result is correlated with a dose of 86.25 mg per week: Lab Results  Component Value Date   INR 1.3 11/09/2015   INR 1.40 08/21/2015   INR 2.30 07/10/2015   INR today: Subtherapeutic  Anticoagulation Dosing: INR as of 11/09/2015 and Previous Dosing Information    INR Dt INR Goal [employer] Sun Mon Tue Wed Thu Fri Sat   11/09/2015 1.3 2.0-3.0 86.25 mg 11.25 mg 15 mg 11.25 mg 11.25 mg 15 mg 11.25 mg 11.25 mg    Anticoagulation Dose Instructions as of 11/09/2015      Total Sun Mon Tue Wed Thu Fri Sat   New Dose 93.75 mg 11.25 mg 15 mg 15 mg 11.25 mg 15 mg 15 mg 11.25 mg     (7.5 mg x 1.5)  (7.5 mg x 2)  (7.5 mg x 2)  (7.5 mg x 1.5)  (7.5 mg x 2)  (7.5 mg x 2)  (7.5 mg x 1.5)                           PLAN Weekly dose was increased by 9% to 93.75 mg per week. Concerned about nonadherence, patient unable to repeat back dosing information correlating with our instructions. Patient also states she may have missed a dose but does not remember. Also refuses DOAC therapy. Asked patient to return to clinic next week for close monitoring. Emphasized the importance of adherence. Patient and significant other verbalized understanding by repeat back.   Patient Instructions  Patient educated about medication as defined in this encounter and verbalized understanding by repeating back instructions  provided.   Follow-up Return in about 5 days (around 11/14/2015) for Follow up INR on 11/14/15 at 11am.  Kim,Jennifer J  30 minutes spent face-to-face with the patient during the encounter. 75% of time spent on education. 25% of time was spent on assessment and plan.

## 2015-11-21 ENCOUNTER — Encounter: Payer: Self-pay | Admitting: Student

## 2015-12-04 ENCOUNTER — Ambulatory Visit: Payer: Medicare Other

## 2015-12-13 ENCOUNTER — Other Ambulatory Visit: Payer: Self-pay | Admitting: Internal Medicine

## 2015-12-13 NOTE — Telephone Encounter (Signed)
Called pharm, pt has not picked up any of the scripts on this med, they stated she could pick it up at 1300, attempted to call pt 4 times, no answer

## 2015-12-13 NOTE — Telephone Encounter (Signed)
Pt requesting gabapentin to be filled. °

## 2015-12-19 ENCOUNTER — Ambulatory Visit: Payer: Medicare Other | Admitting: Internal Medicine

## 2016-01-17 ENCOUNTER — Other Ambulatory Visit: Payer: Self-pay | Admitting: Internal Medicine

## 2016-01-22 ENCOUNTER — Ambulatory Visit (INDEPENDENT_AMBULATORY_CARE_PROVIDER_SITE_OTHER): Payer: Medicare Other | Admitting: Pharmacist

## 2016-01-22 ENCOUNTER — Ambulatory Visit (INDEPENDENT_AMBULATORY_CARE_PROVIDER_SITE_OTHER): Payer: Medicare Other | Admitting: Internal Medicine

## 2016-01-22 ENCOUNTER — Encounter: Payer: Self-pay | Admitting: Internal Medicine

## 2016-01-22 VITALS — BP 94/59 | HR 73 | Temp 97.5°F | Ht 64.5 in | Wt 179.7 lb

## 2016-01-22 DIAGNOSIS — Z23 Encounter for immunization: Secondary | ICD-10-CM | POA: Diagnosis not present

## 2016-01-22 DIAGNOSIS — I739 Peripheral vascular disease, unspecified: Secondary | ICD-10-CM

## 2016-01-22 DIAGNOSIS — F17210 Nicotine dependence, cigarettes, uncomplicated: Secondary | ICD-10-CM

## 2016-01-22 DIAGNOSIS — I10 Essential (primary) hypertension: Secondary | ICD-10-CM

## 2016-01-22 DIAGNOSIS — Z7901 Long term (current) use of anticoagulants: Secondary | ICD-10-CM | POA: Diagnosis not present

## 2016-01-22 DIAGNOSIS — G894 Chronic pain syndrome: Secondary | ICD-10-CM

## 2016-01-22 DIAGNOSIS — I8290 Acute embolism and thrombosis of unspecified vein: Secondary | ICD-10-CM | POA: Diagnosis not present

## 2016-01-22 LAB — POCT INR: INR: 1.4

## 2016-01-22 MED ORDER — LISINOPRIL 20 MG PO TABS: 20.0000 mg | ORAL_TABLET | Freq: Two times a day (BID) | ORAL | Status: DC

## 2016-01-22 MED ORDER — CARVEDILOL 25 MG PO TABS: 25.0000 mg | ORAL_TABLET | Freq: Two times a day (BID) | ORAL | Status: DC

## 2016-01-22 NOTE — Assessment & Plan Note (Signed)
HPI: She was told that she needs to come in for a visit to receive refills of her BP medications.  She is out of Coreg and lisinopril  A: Essential Hypertension, controlled  P: Continue Coreg, Lisinopril, Amlodipine, HCTZ

## 2016-01-22 NOTE — Assessment & Plan Note (Addendum)
HPI: She presents today with chief complaint of bilateral lower leg pain which she reports is due to the PVD of her lower extremities.  She is accompanied by her home health aid.  She reports that she has bilateral lower extremity pain, it is worse with exertion but not releived by rest.  She reports that the pain starts in her hips, continues through her knees, to her calves and her feet on both sides, slightly worse on the right side.  She has not appreciated any new swelling.  She does report falls at home especially when she does not take her pain medication.  She requests a refill of tramadol of which she was last prescribed about 5 months ago.  She reports that tramadol helps to ease the pain so that she can walk some.  She reports she has taken Ibuprofen and diclofenac previously without relief of her pain. She last saw vascular surgery in 2014 and plan at that time was monitoring in 6 months with ABI, would not prusue further intervention unless limb threatening ischemia.  A: PVD  P: Patient has palpable pulses at this time and adaquate capillary refill. - Her PCP has already ordered ABI and I requested that she complete this. - Will not refill Tramadol>> please see problem based charting under Chronic Pain Syndrome

## 2016-01-22 NOTE — Assessment & Plan Note (Signed)
HPI: She presents today for bilateral hip, knee, calf pain, she feels this is due to her PVD.  Her pain is worse with exertion but not relived by rest.  She is mostly confined to a wheelchair and reports that tramadol has helped to allow her to be able to tolerate walking some.  She as last prescribed tramadol in August 2016 and reports that she has not had it in a few months. She has had multiple violations of controlled substance agreements in the [facility].  Her PCP allowed for a one time dose of tramadol in August with a plan for follow up at pain management.  She missed follow up with her pain management appointment (reports due to transportation issues), she was also referred to Neurology for her pain but she missed that appointment also.  Today with my nurse when checking in she was falling asleep, which she attributes to taking tramadol and diazepam last night,  With me she is awake but with slurred speech.  A: Chronic pain syndrome  P: Her pain appears to be multifactorial.  Likely a combination of her PVD, OA, neuropathic pain, and possibly fibromyalgia.   -NSAIDs were not effective for her in the past and she is on ASA+ Warfain, I feel that NSAID RISK benefit ratio is not favorable - She is on a high dose of Gabapentin and I feel that further escilation of her neuropathic pain regimen given her level of sedation is also not advisable - She is on duloxetine - Her PCP has already outlined that she should not receive further narcotics from out clinic due to multiple violations, I also feel that she is very high risk and do not feet comfortable disagreeing with the PCP.  She has not gone to her appointments with specialists for further pain management. I told her that I would not be able to prescribe her tramadol or other narcotic agent due to these issues and her affect changed dramatically.  She raised her voice and repeated "WHAT AM I SUPPOSED TO DO", "I AM IN PAIN", "THIS VISIT WAS WORTHLESS, I WASTED  GAS AND TIME" and other variations of that same theme.  I tried to sympathize with her pain but was firm that I could not prescribe narcotics and that I would only recommend tylenol at this time.  I advised her to call the pain clinic and neurologist to see if they would reschedule her appointments.   This pain is chronic and she does not appear to have done her part to keep the referrals and appointments that have been make (including to her PCP last month).  She continued to be upset with me and I offered for Dr Lynnae January (clinic director to see her).  Dr Lynnae January reported to me that she became even more irate with her and she was given a verbal warning that further escalation of her behavior could result in dismissal from the clinic.

## 2016-01-22 NOTE — Patient Instructions (Signed)
Please get your ABI done to see how your arterial disease has progressed.  I would recommend calling the [facility] and [facility] to see if you can reschedule your appointments.  General Instructions:   Please bring your medicines with you each time you come to clinic.  Medicines may include prescription medications, over-the-counter medications, herbal remedies, eye drops, vitamins, or other pills.   Progress Toward Treatment Goals:  Treatment Goal 02/10/2015  Blood pressure at goal  Stop smoking smoking the same amount  Prevent falls -    Self Care Goals & Plans:  Self Care Goal 01/22/2016  Manage my medications take my medicines as prescribed; bring my medications to every visit; refill my medications on time  Eat healthy foods eat more vegetables; eat foods that are low in salt; eat baked foods instead of fried foods  Be physically active find an activity I enjoy  Stop smoking call [facility] (1-800-QUIT-NOW)  Prevent falls -    No flowsheet data found.   Care Management & Community Referrals:  Referral 10/22/2013  Referrals made for care management support none needed

## 2016-01-22 NOTE — Progress Notes (Signed)
[name] Subjective:   Patient ID: Alisha [name] female   DOB: 06-May-1961 55 y.o.   MRN: [id number]  HPI: [name] Cruel Alisha [name] is a 55 y.o. female with a PMH detailed below who presents for an acute visit for her chronic pain and HTN.  Please see problem based charting below for the status of her chronic medical problems.    Past Medical History  Diagnosis Date  . DVT (deep venous thrombosis) (HCC)     bilateral, 2 episodes: Requires lifelong therapy  . Chronic pain syndrome   . Restless leg syndrome   . Depression   . Hyperlipidemia   . Hypertension   . PVD (peripheral vascular disease) ([place])     s/p L fem-pop bypass 11/21/08; graft occluded 12/28/08;  aortogram w/ bilat LE runoff: 1.  bilat diffuse SFA occlusive dz, 2.  Mod to severe above-knee popliteal dz,  3.  Bilat 3-vessel runoff w/ mild tibial occlusive dz.  . Tobacco abuse   . Degenerative joint disease of knee   . Breast lump 02/2008    Biopsy 05/2008: showed no evidence of malignancy  . Irregular menses 9/08  . Pulmonary edema   . Anxiety   . Joint pain   . Mixed stress and urge urinary incontinence     [facility], underwent cystoscopy & uroflowmetry   . Endometrial mass 10/12/2012    Endometrium, biopsy on 11/04/12 - PROLIFERATIVE ENDOMETRIUM AND ABUNDANT MUCUS. NO HYPERPLASIA OR CARCINOMA.   . Sigmoid diverticulitis 10/26/2012  . Asthma   . Breast mass in female     bi lat    Current Outpatient Prescriptions  Medication Sig Dispense Refill  . albuterol (PROVENTIL HFA;VENTOLIN HFA) 108 (90 BASE) MCG/ACT inhaler Inhale 1-2 puffs into the lungs every 4 (four) hours as needed for wheezing or shortness of breath. 3.7 g 2  . amLODipine (NORVASC) 2.5 MG tablet TAKE 1 TABLET BY MOUTH DAILY 90 tablet 4  . aspirin EC 81 MG tablet Take 81 mg by mouth daily.    Marland Kitchen atorvastatin (LIPITOR) 40 MG tablet Take 1 tablet (40 mg total) by mouth at bedtime. 90 tablet 3  . buPROPion  (WELLBUTRIN XL) 300 MG 24 hr tablet Take 300 mg by mouth daily.    . carvedilol (COREG) 25 MG tablet Take 1 tablet (25 mg total) by mouth 2 (two) times daily with a meal. 60 tablet 0  . diazepam (VALIUM) 5 MG tablet Take 10 mg by mouth 3 (three) times daily.     . diclofenac sodium (VOLTAREN) 1 % GEL Apply 2 g topically 4 (four) times daily. 1 Tube 3  . DULoxetine (CYMBALTA) 60 MG capsule Take 60 mg by mouth daily.     Marland Kitchen gabapentin (NEURONTIN) 800 MG tablet TAKE 1 TABLET BY MOUTH IN THE MORNING, 1 TABLET BY MOUTH MIDDAY, AND 2 TABLETS BY MOUTH AT BEDTIME 360 tablet 4  . hydrochlorothiazide (HYDRODIURIL) 25 MG tablet TAKE 1 TABLET(25 MG) BY MOUTH DAILY 90 tablet 1  . lisinopril (PRINIVIL,ZESTRIL) 20 MG tablet Take 1 tablet (20 mg total) by mouth 2 (two) times daily. 60 tablet 0  . Multiple Vitamins-Minerals (ONE-A-DAY 50 PLUS PO) Take 1 tablet by mouth daily.    . potassium chloride (K-DUR) 10 MEQ tablet TAKE 1 TABLET BY MOUTH EVERY DAY 30 tablet 10  . traMADol (ULTRAM) 50 MG tablet Take 1 tablet (50 mg total) by mouth every 6 (six) hours as needed for severe pain. [place]  tablet 0  . warfarin (COUMADIN) 10 MG tablet Take 1 tablet (10 mg total) by mouth as directed. 4 tablet 0  . warfarin (COUMADIN) 7.5 MG tablet Take 1 tablet (7.5 mg total) by mouth daily. Take 1&1/2 tablets on all days EXCEPT on Fridays--then, take only one tablet. 40 tablet 2  . warfarin (COUMADIN) 7.5 MG tablet TAKE 1 AND 1/2 TABLETS BY MOUTH ON SUNDAY AND WEDNESDAY AND 1 TABLET BY MOUTH ALL OTHER DAYS 120 tablet 4  . ziprasidone (GEODON) 60 MG capsule Take 60 mg by mouth 2 (two) times daily with a meal.      No current facility-administered medications for this visit.   Family History  Problem Relation Age of Onset  . Breast cancer Mother   . Colon cancer Neg Hx   . Rectal cancer Neg Hx   . Stomach cancer Neg Hx   . Breast cancer Other     GREAT AUNT   Social History   Social History  . Marital Status: Divorced     Spouse Name: N/A  . Number of Children: 4  . Years of Education: N/A   Occupational History  . disable    Social History Main Topics  . Smoking status: Current Every Day Smoker -- 0.50 packs/day for 18 years    Types: Cigarettes  . Smokeless tobacco: Never Used     Comment: 1/2 PACK DAY  . Alcohol Use: 0.0 oz/week    0 Standard drinks or equivalent per week     Comment: rare  . Drug Use: No     Comment: hx of marijuana and cocaine use  . Sexual Activity: No   Other Topics Concern  . None   Social History Narrative   Patent examiner initiated. Patient needs to submit further paperwork to complete   [facility]  September 11, 2010 2:04 PM   Financial assistance approved for 100% discount at [facility] and has [facility] card   [name]  October 04, 2010 5:29 PM   Review of Systems: Review of Systems  Constitutional: Negative for fever and chills.  Cardiovascular: Negative for chest pain.  Musculoskeletal: Positive for back pain, joint pain and falls.  Neurological: Negative for dizziness and tingling.  Psychiatric/Behavioral: Negative for substance abuse.     Objective:  Physical Exam: Filed Vitals:   01/22/16 1002  BP: 94/59  Pulse: 73  Temp: 97.5 F (36.4 C)  TempSrc: Oral  Height: 5' 4.5" (1.638 m)  Weight: 179 lb 11.2 oz (81.511 kg)  SpO2: 100%  Physical Exam  Constitutional: No distress.  In wheelchair, Slurred speech  Cardiovascular:  Right DP and PT pulses easily palpable, left DP and PT pulse are faint but with <1sec capillary refill of great toe  Nursing note and vitals reviewed.   Assessment & Plan:  Case discussed with Dr. Lynnae January  Peripheral vascular disease HPI: She presents today with chief complaint of bilateral lower leg pain which she reports is due to the PVD of her lower extremities.  She is accompanied by her home health aid.  She reports that she has bilateral lower extremity pain, it is worse with exertion but not releived by  rest.  She reports that the pain starts in her hips, continues through her knees, to her calves and her feet on both sides, slightly worse on the right side.  She has not appreciated any new swelling.  She does report falls at home especially when she does not take her pain  medication.  She requests a refill of tramadol of which she was last prescribed about 5 months ago.  She reports that tramadol helps to ease the pain so that she can walk some.  She reports she has taken Ibuprofen and diclofenac previously without relief of her pain. She last saw vascular surgery in 2014 and plan at that time was monitoring in 6 months with ABI, would not prusue further intervention unless limb threatening ischemia.  A: PVD  P: Patient has palpable pulses at this time and adaquate capillary refill. - Her PCP has already ordered ABI and I requested that she complete this. - Will not refill Tramadol>> please see problem based charting under Chronic Pain Syndrome    HTN (hypertension) HPI: She was told that she needs to come in for a visit to receive refills of her BP medications.  She is out of Coreg and lisinopril  A: Essential Hypertension, controlled  P: Continue Coreg, Lisinopril, Amlodipine, HCTZ  Chronic pain syndrome HPI: She presents today for bilateral hip, knee, calf pain, she feels this is due to her PVD.  Her pain is worse with exertion but not relived by rest.  She is mostly confined to a wheelchair and reports that tramadol has helped to allow her to be able to tolerate walking some.  She as last prescribed tramadol in August 2016 and reports that she has not had it in a few months. She has had multiple violations of controlled substance agreements in the [facility].  Her PCP allowed for a one time dose of tramadol in August with a plan for follow up at pain management.  She missed follow up with her pain management appointment (reports due to transportation issues), she was also referred to Neurology  for her pain but she missed that appointment also.  Today with my nurse when checking in she was falling asleep, which she attributes to taking tramadol and diazepam last night,  With me she is awake but with slurred speech.  A: Chronic pain syndrome  P: Her pain appears to be multifactorial.  Likely a combination of her PVD, OA, neuropathic pain, and possibly fibromyalgia.   -NSAIDs were not effective for her in the past and she is on ASA+ Warfain, I feel that NSAID RISK benefit ratio is not favorable - She is on a high dose of Gabapentin and I feel that further escilation of her neuropathic pain regimen given her level of sedation is also not advisable - She is on duloxetine - Her PCP has already outlined that she should not receive further narcotics from out clinic due to multiple violations, I also feel that she is very high risk and do not feet comfortable disagreeing with the PCP.  She has not gone to her appointments with specialists for further pain management. I told her that I would not be able to prescribe her tramadol or other narcotic agent due to these issues and her affect changed dramatically.  She raised her voice and repeated "WHAT AM I SUPPOSED TO DO", "I AM IN PAIN", "THIS VISIT WAS WORTHLESS, I WASTED GAS AND TIME" and other variations of that same theme.  I tried to sympathize with her pain but was firm that I could not prescribe narcotics and that I would only recommend tylenol at this time.  I advised her to call the pain clinic and neurologist to see if they would reschedule her appointments.   This pain is chronic and she does not appear to have done  her part to keep the referrals and appointments that have been make (including to her PCP last month).  She continued to be upset with me and I offered for Dr Lynnae January (clinic director to see her).  Dr Lynnae January reported to me that she became even more irate with her and she was given a verbal warning that further escalation of her  behavior could result in dismissal from the clinic.    Medications Ordered Meds ordered this encounter  Medications  . lisinopril (PRINIVIL,ZESTRIL) 20 MG tablet    Sig: Take 1 tablet (20 mg total) by mouth 2 (two) times daily.    Dispense:  60 tablet    Refill:  0  . carvedilol (COREG) 25 MG tablet    Sig: Take 1 tablet (25 mg total) by mouth 2 (two) times daily with a meal.    Dispense:  60 tablet    Refill:  0   Other Orders Orders Placed This Encounter  Procedures  . Flu Vaccine QUAD 36+ mos IM   Follow Up: Return next avaliable with PCP.

## 2016-01-23 ENCOUNTER — Other Ambulatory Visit: Payer: Self-pay | Admitting: Internal Medicine

## 2016-01-23 DIAGNOSIS — G894 Chronic pain syndrome: Secondary | ICD-10-CM

## 2016-01-23 NOTE — Progress Notes (Signed)
Internal Medicine Clinic Attending  I saw and evaluated the patient.  I personally confirmed the key portions of the history and exam documented by Dr. Heber [place] and I reviewed pertinent patient test results.  The assessment, diagnosis, and plan were formulated together and I agree with the documentation in the resident's note. Dr Heber [place] had asked me to see Ms Borey as she was unhappy to not be receiving an Rx for tramadol for her chronic pain. Pt had previously been denied chronic opioids but recently had been prescribed tramadol sparingly until she could see a subspecialist. She most recently got 30 tramadol in mid August per her PCP and had been told that was the last fill of a controlled substance. Dr Hulen Luster had referred her to pain therapy but the pt no showed her July appt. She was then set up with neuro but no showed that appt. She was sch to see Dr Hulen Luster her PCP in Dec but no showed that appt. I agree with Dr Heber  that controlled substances are not indicated bec 1. Not keeping referrals 2. Not keeping appts with PCP (3 since Aug, 5 in past yr) 3. Only needed 30 tramadol over 5 months  4. Overall, not actively participating in her care.  When asked about the no shows, she said her aide's car was not working but she admitted she did not call the agency to arrange alternative transportation. She also refused SCAT bc "long" wait. She asked about alternative treatments and I offered tylenol but then asked if I "would pay for it" bc she had no money and medicaid doesn't pay for OTC. She was unable to state how she would have paid for tramadol had we Rx'd it.   As she realized that I would not Rx tramadol, she raised her voice, yelling and would not let me speak. She loudly banged on the exam table repeatedly. I asked her to lower her voice and act in an appropriate manner and she cont to yell and bang the table. I informed her that her behavior was inappropriate and if cont would result in her  dismissal.

## 2016-01-23 NOTE — Patient Instructions (Signed)
Patient instructed to take medications as defined in the Anti-coagulation Track section of this encounter.  Patient instructed to take today's dose.  Patient verbalized understanding of these instructions.    

## 2016-01-23 NOTE — Progress Notes (Signed)
Anti-Coagulation Progress Note  Alisha [name] is a 55 y.o. female who is currently on an anti-coagulation regimen.    RECENT RESULTS: Recent results are below, the most recent result is correlated with a dose of 93.75 mg. per week: Lab Results  Component Value Date   INR 1.40 01/22/2016   INR 1.3 11/09/2015   INR 1.40 08/21/2015    ANTI-COAG DOSE: Anticoagulation Dose Instructions as of 01/22/2016      Dorene Grebe Tue Wed Thu Fri Sat   New Dose 15 mg 15 mg 15 mg 15 mg 15 mg 15 mg 15 mg       ANTICOAG SUMMARY: Anticoagulation Episode Summary    Current INR goal 2.0-3.0  Next INR check 03/04/2016  INR from last check 1.40! (01/22/2016)  Weekly max dose   Target end date   [facility]  Preferred lab   Send INR reminders to    Indications  Venous thromboembolism [I82.90]        Comments         ANTICOAG TODAY: Anticoagulation Summary as of 01/22/2016    INR goal 2.0-3.0  Selected INR 1.40! (01/22/2016)  Next INR check 03/04/2016  Target end date    Indications  Venous thromboembolism [I82.90]      Anticoagulation Episode Summary    [facility]   Preferred lab    Send INR reminders to    Comments       PATIENT INSTRUCTIONS: Patient Instructions  Patient instructed to take medications as defined in the Anti-coagulation Track section of this encounter.  Patient instructed to take today's dose.  Patient verbalized understanding of these instructions.       FOLLOW-UP Return in 2 weeks (on 02/05/2016) for Follow up INR at 1015h.  Jorene Guest, III Pharm.D., CACP

## 2016-01-24 ENCOUNTER — Encounter: Payer: Self-pay | Admitting: Internal Medicine

## 2016-01-29 DIAGNOSIS — F333 Major depressive disorder, recurrent, severe with psychotic symptoms: Secondary | ICD-10-CM | POA: Diagnosis not present

## 2016-02-01 ENCOUNTER — Encounter: Payer: Medicare Other | Admitting: Internal Medicine

## 2016-02-07 ENCOUNTER — Inpatient Hospital Stay (HOSPITAL_COMMUNITY)
Admission: RE | Admit: 2016-02-07 | Discharge: 2016-02-07 | Disposition: A | Discharge status: Home / Self Care | Payer: Medicare Other | Source: Ambulatory Visit | Attending: Internal Medicine | Admitting: Internal Medicine

## 2016-02-07 ENCOUNTER — Telehealth: Payer: Self-pay | Admitting: Internal Medicine

## 2016-02-07 DIAGNOSIS — G894 Chronic pain syndrome: Secondary | ICD-10-CM

## 2016-02-07 NOTE — Telephone Encounter (Signed)
Calling wanting to change an order for a patients appt for this morning at 9

## 2016-02-07 NOTE — Telephone Encounter (Signed)
Sent to [facility] 

## 2016-02-12 ENCOUNTER — Ambulatory Visit: Payer: Medicare Other

## 2016-02-20 ENCOUNTER — Ambulatory Visit (INDEPENDENT_AMBULATORY_CARE_PROVIDER_SITE_OTHER): Payer: Medicare Other | Admitting: Pharmacist

## 2016-02-20 DIAGNOSIS — Z7901 Long term (current) use of anticoagulants: Secondary | ICD-10-CM | POA: Diagnosis not present

## 2016-02-20 DIAGNOSIS — Z9114 Patient's other noncompliance with medication regimen: Secondary | ICD-10-CM

## 2016-02-20 DIAGNOSIS — I8290 Acute embolism and thrombosis of unspecified vein: Secondary | ICD-10-CM | POA: Diagnosis not present

## 2016-02-20 LAB — POCT INR: INR: 1.4

## 2016-02-20 MED ORDER — WARFARIN SODIUM 7.5 MG PO TABS: 15.0000 mg | ORAL_TABLET | Freq: Every day | ORAL | Status: DC

## 2016-02-20 NOTE — Progress Notes (Signed)
Anticoagulation Management Alisha [name] is a 55 y.o. female who reports to the clinic for monitoring of warfarin treatment.    Indication: VTE history Duration: indefinite  Anticoagulation Clinic Visit History: Patient does not report signs/symptoms of bleeding or thromboembolism. Non-adherence: patient states she has not been taking 2 tablets daily as instructed (takes 1.5 tablets on 3 days per week).  Anticoagulation Episode Summary    Current INR goal 2.0-3.0  Next INR check 02/26/2016  INR from last check 1.4! (02/20/2016)  Weekly max dose   Target end date   [facility]  Preferred lab   Send INR reminders to    Indications  Venous thromboembolism [I82.90]        Comments        ASSESSMENT Recent Results: Lab Results  Component Value Date   INR 1.4 02/20/2016   INR 1.40 01/22/2016   INR 1.3 11/09/2015   Anticoagulation Dosing: INR as of 02/20/2016 and Previous Dosing Information    INR Dt INR Goal [employer] Sun Mon Tue Wed Thu Fri Sat   02/20/2016 1.4 2.0-3.0 105 mg 15 mg 15 mg 15 mg 15 mg 15 mg 15 mg 15 mg    Anticoagulation Dose Instructions as of 02/20/2016      Total Sun Mon Tue Wed Thu Fri Sat   New Dose 105 mg 15 mg 15 mg 15 mg 15 mg 15 mg 15 mg 15 mg     (7.5 mg x 2)  (7.5 mg x 2)  (7.5 mg x 2)  (7.5 mg x 2)  (7.5 mg x 2)  (7.5 mg x 2)  (7.5 mg x 2)                           INR today: Subtherapeutic  PLAN Weekly dose was unchanged due to patient reporting taking incorrectly. Provided education on the importance of adherence.  Patient Instructions  Patient educated about medication as defined in this encounter and verbalized understanding by repeating back instructions provided.    Patient advised to contact clinic or seek medical attention if signs/symptoms of bleeding or thromboembolism occur.  Patient verbalized understanding by repeating back information and was advised to contact me if further medication-related  questions arise. Patient was also provided an information handout.  Follow-up Return in about 6 days (around 02/26/2016) for Follow up INR around 8:30.  Kim,Jennifer J  15 minutes spent face-to-face with the patient during the encounter. 50% of time spent on education. 50% of time was spent on assessment and plan.

## 2016-02-20 NOTE — Patient Instructions (Signed)
Patient educated about medication as defined in this encounter and verbalized understanding by repeating back instructions provided.   

## 2016-02-22 NOTE — Progress Notes (Signed)
I have reviewed Dr. Julianne Rice note.  Patient on [facility] for VTE. She was not taking coumadin correctly.  Dose unchanged, INR low.

## 2016-02-26 ENCOUNTER — Ambulatory Visit (INDEPENDENT_AMBULATORY_CARE_PROVIDER_SITE_OTHER): Payer: Medicare Other | Admitting: Pharmacist

## 2016-02-26 DIAGNOSIS — Z7901 Long term (current) use of anticoagulants: Secondary | ICD-10-CM

## 2016-02-26 DIAGNOSIS — I8290 Acute embolism and thrombosis of unspecified vein: Secondary | ICD-10-CM | POA: Diagnosis not present

## 2016-02-26 LAB — POCT INR: INR: 2

## 2016-02-26 NOTE — Progress Notes (Signed)
Anti-Coagulation Progress Note  Alisha [name] is a 55 y.o. female who is currently on an anti-coagulation regimen.    RECENT RESULTS: Recent results are below, the most recent result is correlated with a dose of 105 mg. per week: Lab Results  Component Value Date   INR 2.0 02/26/2016   INR 1.4 02/20/2016   INR 1.40 01/22/2016    ANTI-COAG DOSE: Anticoagulation Dose Instructions as of 02/26/2016      Dorene Grebe Tue Wed Thu Fri Sat   New Dose 15 mg 18.75 mg 15 mg 15 mg 18.75 mg 15 mg 15 mg       ANTICOAG SUMMARY: Anticoagulation Episode Summary    Current INR goal 2.0-3.0  Next INR check 03/11/2016  INR from last check 2.0 (02/26/2016)  Weekly max dose   Target end date   [facility]  Preferred lab   Send INR reminders to    Indications  Venous thromboembolism [I82.90]        Comments         ANTICOAG TODAY: Anticoagulation Summary as of 02/26/2016    INR goal 2.0-3.0  Selected INR 2.0 (02/26/2016)  Next INR check 03/11/2016  Target end date    Indications  Venous thromboembolism [I82.90]      Anticoagulation Episode Summary    [facility]   Preferred lab    Send INR reminders to    Comments       PATIENT INSTRUCTIONS: Patient Instructions  Patient instructed to take medications as defined in the Anti-coagulation Track section of this encounter.  Patient instructed to take today's dose.  Patient verbalized understanding of these instructions.       FOLLOW-UP Return in 2 weeks (on 03/11/2016) for Follow up INR at 0845h.  Jorene Guest, III Pharm.D., CACP

## 2016-02-26 NOTE — Patient Instructions (Signed)
Patient instructed to take medications as defined in the Anti-coagulation Track section of this encounter.  Patient instructed to take today's dose.  Patient verbalized understanding of these instructions.    

## 2016-02-27 ENCOUNTER — Other Ambulatory Visit: Payer: Self-pay | Admitting: Internal Medicine

## 2016-02-27 MED ORDER — ALBUTEROL SULFATE HFA 108 (90 BASE) MCG/ACT IN AERS: 1.0000 | INHALATION_SPRAY | RESPIRATORY_TRACT | Status: DC | PRN

## 2016-02-27 NOTE — Telephone Encounter (Signed)
Pt requesting albuterol inhaler to be filled @ [facility] on [address].

## 2016-03-04 ENCOUNTER — Ambulatory Visit: Payer: Medicare Other

## 2016-03-06 ENCOUNTER — Encounter: Payer: Medicare Other | Admitting: Internal Medicine

## 2016-03-06 ENCOUNTER — Encounter: Payer: Self-pay | Admitting: Internal Medicine

## 2016-03-11 ENCOUNTER — Other Ambulatory Visit: Payer: Self-pay | Admitting: Internal Medicine

## 2016-03-11 ENCOUNTER — Ambulatory Visit: Payer: Medicare Other

## 2016-03-11 NOTE — Telephone Encounter (Signed)
Pt requesting lisinopril to be filled @ [facility] on [address].

## 2016-03-13 MED ORDER — LISINOPRIL 20 MG PO TABS: 20.0000 mg | ORAL_TABLET | Freq: Two times a day (BID) | ORAL | Status: DC

## 2016-03-15 NOTE — Addendum Note (Signed)
Addended by: Forde Dandy on: 03/15/2016 06:12 PM   Modules accepted: Orders, Medications

## 2016-03-18 ENCOUNTER — Ambulatory Visit (INDEPENDENT_AMBULATORY_CARE_PROVIDER_SITE_OTHER): Payer: Medicare Other | Admitting: Pharmacist

## 2016-03-18 DIAGNOSIS — I8290 Acute embolism and thrombosis of unspecified vein: Secondary | ICD-10-CM

## 2016-03-18 DIAGNOSIS — Z7901 Long term (current) use of anticoagulants: Secondary | ICD-10-CM

## 2016-03-18 LAB — POCT INR: INR: 1.8

## 2016-03-18 NOTE — Progress Notes (Signed)
Anti-Coagulation Progress Note  Alisha [name] is a 55 y.o. female who is currently on an anti-coagulation regimen.    RECENT RESULTS: Recent results are below, the most recent result is correlated with a dose of 112.5 mg. per week: Lab Results  Component Value Date   INR 1.80 03/18/2016   INR 2.0 02/26/2016   INR 1.4 02/20/2016    ANTI-COAG DOSE: Anticoagulation Dose Instructions as of 03/18/2016      Sun Mon Tue Wed Thu Fri Sat   New Dose 15 mg 22.5 mg 15 mg 15 mg 22.5 mg 15 mg 22.5 mg       ANTICOAG SUMMARY: Anticoagulation Episode Summary    Current INR goal 2.0-3.0  Next INR check 04/08/2016  INR from last check 1.80! (03/18/2016)  Weekly max dose   Target end date   [facility] check location [facility]  Preferred lab   Send INR reminders to    Indications  Venous thromboembolism [I82.90]        Comments         ANTICOAG TODAY: Anticoagulation Summary as of 03/18/2016    INR goal 2.0-3.0  Selected INR 1.80! (03/18/2016)  Next INR check 04/08/2016  Target end date    Indications  Venous thromboembolism [I82.90]      Anticoagulation Episode Summary    [facility] check location [facility]   Preferred lab    Send INR reminders to    Comments       PATIENT INSTRUCTIONS: Patient Instructions  Patient instructed to take medications as defined in the Anti-coagulation Track section of this encounter.  Patient instructed to take today's dose.  Patient verbalized understanding of these instructions.       FOLLOW-UP Return in 3 weeks (on 04/08/2016) for Follow up INR at 1030h.  Jorene Guest, III Pharm.D., CACP

## 2016-03-18 NOTE — Patient Instructions (Signed)
Patient instructed to take medications as defined in the Anti-coagulation Track section of this encounter.  Patient instructed to take today's dose.  Patient verbalized understanding of these instructions.    

## 2016-04-08 ENCOUNTER — Ambulatory Visit (INDEPENDENT_AMBULATORY_CARE_PROVIDER_SITE_OTHER): Payer: Medicare Other | Admitting: Pharmacist

## 2016-04-08 DIAGNOSIS — Z7901 Long term (current) use of anticoagulants: Secondary | ICD-10-CM | POA: Diagnosis not present

## 2016-04-08 DIAGNOSIS — I8290 Acute embolism and thrombosis of unspecified vein: Secondary | ICD-10-CM | POA: Diagnosis not present

## 2016-04-08 LAB — POCT INR: INR: 2.3

## 2016-04-08 NOTE — Patient Instructions (Signed)
Patient instructed to take medications as defined in the Anti-coagulation Track section of this encounter.  Patient instructed to take today's dose.  Patient verbalized understanding of these instructions.    

## 2016-04-08 NOTE — Progress Notes (Signed)
Anti-Coagulation Progress Note  Alisha [name] is a 55 y.o. female who is currently on an anti-coagulation regimen.    RECENT RESULTS: Recent results are below, the most recent result is correlated with a dose of 127.5 mg. per week: Lab Results  Component Value Date   INR 2.30 04/08/2016   INR 1.80 03/18/2016   INR 2.0 02/26/2016    ANTI-COAG DOSE: Anticoagulation Dose Instructions as of 04/08/2016      Sun Mon Tue Wed Thu Fri Sat   New Dose 15 mg 22.5 mg 15 mg 15 mg 22.5 mg 15 mg 22.5 mg       ANTICOAG SUMMARY: Anticoagulation Episode Summary    Current INR goal 2.0-3.0  Next INR check 04/29/2016  INR from last check 2.30 (04/08/2016)  Weekly max dose   Target end date   [facility]  Preferred lab   Send INR reminders to    Indications  Venous thromboembolism [I82.90]        Comments         ANTICOAG TODAY: Anticoagulation Summary as of 04/08/2016    INR goal 2.0-3.0  Selected INR 2.30 (04/08/2016)  Next INR check 04/29/2016  Target end date    Indications  Venous thromboembolism [I82.90]      Anticoagulation Episode Summary    [facility] check [facility]   Preferred lab    Send INR reminders to    Comments       PATIENT INSTRUCTIONS: Patient Instructions  Patient instructed to take medications as defined in the Anti-coagulation Track section of this encounter.  Patient instructed to take today's dose.  Patient verbalized understanding of these instructions.       FOLLOW-UP Return in 3 weeks (on 04/29/2016) for Follow up INR at 1015h.  Jorene Guest, III Pharm.D., CACP

## 2016-04-09 ENCOUNTER — Other Ambulatory Visit: Payer: Self-pay | Admitting: Internal Medicine

## 2016-04-09 MED ORDER — CARVEDILOL 25 MG PO TABS: 25.0000 mg | ORAL_TABLET | Freq: Two times a day (BID) | ORAL | Status: DC

## 2016-04-09 NOTE — Progress Notes (Signed)
Indication: Recurrent venous thromboembolism. Duration: Indefinite. INR: At target. Agree with Dr. Groce's assessment and plan. 

## 2016-04-09 NOTE — Telephone Encounter (Signed)
NEEDS REFILL FOR COREG 25MG - [facility] [phone]

## 2016-04-29 ENCOUNTER — Ambulatory Visit (INDEPENDENT_AMBULATORY_CARE_PROVIDER_SITE_OTHER): Payer: Medicare Other | Admitting: Pharmacist

## 2016-04-29 ENCOUNTER — Other Ambulatory Visit: Payer: Self-pay | Admitting: *Deleted

## 2016-04-29 DIAGNOSIS — I8290 Acute embolism and thrombosis of unspecified vein: Secondary | ICD-10-CM | POA: Diagnosis not present

## 2016-04-29 DIAGNOSIS — Z7901 Long term (current) use of anticoagulants: Secondary | ICD-10-CM

## 2016-04-29 LAB — POCT INR: INR: 2.1

## 2016-04-29 NOTE — Telephone Encounter (Signed)
Pt was made an appt with pcp for Thursday 5/4, she was informed she would possibly be given enough to last til appt

## 2016-04-29 NOTE — Patient Instructions (Signed)
Patient instructed to take medications as defined in the Anti-coagulation Track section of this encounter.  Patient instructed to take today's dose.  Patient verbalized understanding of these instructions.    

## 2016-04-29 NOTE — Progress Notes (Signed)
INTERNAL MEDICINE TEACHING ATTENDING ADDENDUM - Reah Justo M.D  Duration- indefinite, Indication- recurrent DVT, INR- therapeutic. Agree with pharmacy recommendations as outlined in their note.     

## 2016-04-29 NOTE — Progress Notes (Signed)
Anti-Coagulation Progress Note  Alisha [name] is a 55 y.o. female who is currently on an anti-coagulation regimen.    RECENT RESULTS: Recent results are below, the most recent result is correlated with a dose of 127.5 mg. per week: Lab Results  Component Value Date   INR 2.10 04/29/2016   INR 2.30 04/08/2016   INR 1.80 03/18/2016    ANTI-COAG DOSE: Anticoagulation Dose Instructions as of 04/29/2016      Sun Mon Tue Wed Thu Fri Sat   New Dose 22.5 mg 22.5 mg 15 mg 22.5 mg 15 mg 22.5 mg 15 mg       ANTICOAG SUMMARY: Anticoagulation Episode Summary    Current INR goal 2.0-3.0  Next INR check 05/20/2016  INR from last check 2.10 (04/29/2016)  Weekly max dose   Target end date   [facility]  Preferred lab   Send INR reminders to    Indications  Venous thromboembolism [I82.90]        Comments         ANTICOAG TODAY: Anticoagulation Summary as of 04/29/2016    INR goal 2.0-3.0  Selected INR 2.10 (04/29/2016)  Next INR check 05/20/2016  Target end date    Indications  Venous thromboembolism [I82.90]      Anticoagulation Episode Summary    [facility] check [facility]   Preferred lab    Send INR reminders to    Comments       PATIENT INSTRUCTIONS: Patient Instructions  Patient instructed to take medications as defined in the Anti-coagulation Track section of this encounter.  Patient instructed to take today's dose.  Patient verbalized understanding of these instructions.       FOLLOW-UP Return in 3 weeks (on 05/20/2016) for Follow up INR at 1000h.  Jorene Guest, III Pharm.D., CACP

## 2016-04-30 MED ORDER — HYDROCHLOROTHIAZIDE 25 MG PO TABS: 25.0000 mg | ORAL_TABLET | Freq: Every day | ORAL | Status: DC

## 2016-05-02 ENCOUNTER — Encounter: Payer: Medicare Other | Admitting: Internal Medicine

## 2016-05-02 ENCOUNTER — Encounter: Payer: Self-pay | Admitting: Internal Medicine

## 2016-05-20 ENCOUNTER — Ambulatory Visit (INDEPENDENT_AMBULATORY_CARE_PROVIDER_SITE_OTHER): Payer: Medicare Other | Admitting: Pharmacist

## 2016-05-20 DIAGNOSIS — I82509 Chronic embolism and thrombosis of unspecified deep veins of unspecified lower extremity: Secondary | ICD-10-CM

## 2016-05-20 DIAGNOSIS — Z7901 Long term (current) use of anticoagulants: Secondary | ICD-10-CM | POA: Diagnosis not present

## 2016-05-20 DIAGNOSIS — I8290 Acute embolism and thrombosis of unspecified vein: Secondary | ICD-10-CM

## 2016-05-20 LAB — POCT INR: INR: 1.2

## 2016-05-20 NOTE — Patient Instructions (Signed)
Patient instructed to take medications as defined in the Anti-coagulation Track section of this encounter.  Patient instructed to take today's dose.  Patient verbalized understanding of these instructions.    

## 2016-05-20 NOTE — Progress Notes (Signed)
INTERNAL MEDICINE TEACHING ATTENDING ADDENDUM - Jaidyn Kuhl M.D  Duration- indefinite, Indication-  recurrent DVT, INR- sub-therapeutic. Agree with pharmacy recommendations as outlined in their note.     

## 2016-05-20 NOTE — Progress Notes (Signed)
Anti-Coagulation Progress Note  Alisha [name] is a 55 y.o. female who is currently on an anti-coagulation regimen.    RECENT RESULTS: Recent results are below, the most recent result is correlated with a dose of 135 mg. per week: Lab Results  Component Value Date   INR 1.20 05/20/2016   INR 2.10 04/29/2016   INR 2.30 04/08/2016    ANTI-COAG DOSE: Anticoagulation Dose Instructions as of 05/20/2016      Sun Mon Tue Wed Thu Fri Sat   New Dose 22.5 mg 22.5 mg 22.5 mg 22.5 mg 15 mg 22.5 mg 15 mg       ANTICOAG SUMMARY: Anticoagulation Episode Summary    Current INR goal 2.0-3.0  Next INR check 06/03/2016  INR from last check 1.20! (05/20/2016)  Weekly max dose   Target end date   [facility]  Preferred lab   Send INR reminders to    Indications  Venous thromboembolism [I82.90]        Comments         ANTICOAG TODAY: Anticoagulation Summary as of 05/20/2016    INR goal 2.0-3.0  Selected INR 1.20! (05/20/2016)  Next INR check 06/03/2016  Target end date    Indications  Venous thromboembolism [I82.90]      Anticoagulation Episode Summary    [facility] check location [facility]   Preferred lab    Send INR reminders to    Comments       PATIENT INSTRUCTIONS: Patient Instructions  Patient instructed to take medications as defined in the Anti-coagulation Track section of this encounter.  Patient instructed to take today's dose.  Patient verbalized understanding of these instructions.       FOLLOW-UP Return in 2 weeks (on 06/03/2016) for Follow up INR at 1015h.  Jorene Guest, III Pharm.D., CACP

## 2016-06-03 ENCOUNTER — Ambulatory Visit (INDEPENDENT_AMBULATORY_CARE_PROVIDER_SITE_OTHER): Payer: Medicare Other | Admitting: Pharmacist

## 2016-06-03 DIAGNOSIS — I8290 Acute embolism and thrombosis of unspecified vein: Secondary | ICD-10-CM

## 2016-06-03 DIAGNOSIS — Z7901 Long term (current) use of anticoagulants: Secondary | ICD-10-CM | POA: Diagnosis not present

## 2016-06-03 LAB — POCT INR: INR: 3.5

## 2016-06-03 NOTE — Progress Notes (Signed)
Anti-Coagulation Progress Note  Alisha [name] is a 55 y.o. female who is currently on an anti-coagulation regimen.    RECENT RESULTS: Recent results are below, the most recent result is correlated with a dose of 142.5 mg. per week: Lab Results  Component Value Date   INR 3.50 06/03/2016   INR 1.20 05/20/2016   INR 2.10 04/29/2016    ANTI-COAG DOSE: Anticoagulation Dose Instructions as of 06/03/2016      Dorene Grebe Tue Wed Thu Fri Sat   New Dose 18.75 mg 22.5 mg 18.75 mg 18.75 mg 22.5 mg 18.75 mg 18.75 mg       ANTICOAG SUMMARY: Anticoagulation Episode Summary    Current INR goal 2.0-3.0  Next INR check 06/17/2016  INR from last check 3.50! (06/03/2016)  Weekly max dose   Target end date   [facility]  Preferred lab   Send INR reminders to    Indications  Venous thromboembolism [I82.90]        Comments         ANTICOAG TODAY: Anticoagulation Summary as of 06/03/2016    INR goal 2.0-3.0  Selected INR 3.50! (06/03/2016)  Next INR check 06/17/2016  Target end date    Indications  Venous thromboembolism [I82.90]      Anticoagulation Episode Summary    [facility] check location [facility]   Preferred lab    Send INR reminders to    Comments       PATIENT INSTRUCTIONS: Patient Instructions  Patient instructed to take medications as defined in the Anti-coagulation Track section of this encounter.  Patient instructed to take today's dose.  Patient verbalized understanding of these instructions.       FOLLOW-UP Return in 2 weeks (on 06/17/2016) for Follow up INR at 1000h.  Jorene Guest, III Pharm.D., CACP

## 2016-06-03 NOTE — Progress Notes (Signed)
INTERNAL MEDICINE TEACHING ATTENDING ADDENDUM - Aldine Contes M.D  Duration- indefinite, Indication- recurrent DVT, INR- supratherapeutic INR. Agree with pharmacy recommendations as outlined in their note.

## 2016-06-03 NOTE — Patient Instructions (Signed)
Patient instructed to take medications as defined in the Anti-coagulation Track section of this encounter.  Patient instructed to take today's dose.  Patient verbalized understanding of these instructions.    

## 2016-06-13 ENCOUNTER — Other Ambulatory Visit: Payer: Self-pay

## 2016-06-13 NOTE — Telephone Encounter (Signed)
Pt requesting Coreg to be filled @ [facility] on [address].

## 2016-06-14 MED ORDER — CARVEDILOL 25 MG PO TABS: 25.0000 mg | ORAL_TABLET | Freq: Two times a day (BID) | ORAL | Status: DC

## 2016-06-20 ENCOUNTER — Encounter: Payer: Self-pay | Admitting: Internal Medicine

## 2016-06-20 ENCOUNTER — Ambulatory Visit: Payer: Medicare Other | Admitting: Internal Medicine

## 2016-08-19 ENCOUNTER — Ambulatory Visit (INDEPENDENT_AMBULATORY_CARE_PROVIDER_SITE_OTHER): Payer: Medicare Other | Admitting: Pharmacist

## 2016-08-19 DIAGNOSIS — Z7901 Long term (current) use of anticoagulants: Secondary | ICD-10-CM

## 2016-08-19 DIAGNOSIS — I8290 Acute embolism and thrombosis of unspecified vein: Secondary | ICD-10-CM

## 2016-08-19 LAB — POCT INR: INR: 2.1

## 2016-08-19 NOTE — Progress Notes (Signed)
Anti-Coagulation Progress Note  Alisha [name] is a 55 y.o. female who is currently on an anti-coagulation regimen.    RECENT RESULTS: Recent results are below, the most recent result is correlated with a dose of 116.25 mg. per week: Lab Results  Component Value Date   INR 2.10 08/19/2016   INR 3.50 06/03/2016   INR 1.20 05/20/2016    ANTI-COAG DOSE: Anticoagulation Dose Instructions as of 08/19/2016      Dorene Grebe Tue Wed Thu Fri Sat   New Dose 18.75 mg 22.5 mg 18.75 mg 22.5 mg 18.75 mg 18.75 mg 22.5 mg       ANTICOAG SUMMARY: Anticoagulation Episode Summary    Current INR goal:   2.0-3.0  TTR:   25.4 % (1.3 y)  Next INR check:   09/09/2016  INR from last check:   2.10 (08/19/2016)  Weekly max dose:     Target end date:     [facility]  Preferred lab:     Send INR reminders to:      Indications   Venous thromboembolism [I82.90]       Comments:           ANTICOAG TODAY: Anticoagulation Summary  As of 08/19/2016   INR goal:   2.0-3.0  TTR:     Today's INR:   2.10  Next INR check:   09/09/2016  Target end date:      Indications   Venous thromboembolism [I82.90]        Anticoagulation Episode Summary    IN[facility] check location:   [facility]   Preferred lab:      Send INR reminders to:      Comments:         PATIENT INSTRUCTIONS: There are no Patient Instructions on file for this visit.   FOLLOW-UP Return in about 3 weeks (around 09/09/2016) for Follow up INR at 1000h.  Jorene Guest, III Pharm.D., CACP

## 2016-08-19 NOTE — Patient Instructions (Signed)
Patient instructed to take medications as defined in the Anti-coagulation Track section of this encounter.  Patient instructed to take today's dose.  Patient verbalized understanding of these instructions.    

## 2016-08-30 ENCOUNTER — Other Ambulatory Visit: Payer: Self-pay

## 2016-08-30 DIAGNOSIS — F333 Major depressive disorder, recurrent, severe with psychotic symptoms: Secondary | ICD-10-CM | POA: Diagnosis not present

## 2016-08-30 NOTE — Telephone Encounter (Signed)
Requesting inhaler to be filled @ [facility] on [address].

## 2016-08-31 MED ORDER — ALBUTEROL SULFATE HFA 108 (90 BASE) MCG/ACT IN AERS: 1.0000 | INHALATION_SPRAY | RESPIRATORY_TRACT | 3 refills | Status: DC | PRN

## 2016-09-09 ENCOUNTER — Ambulatory Visit (INDEPENDENT_AMBULATORY_CARE_PROVIDER_SITE_OTHER): Payer: Medicare Other | Admitting: Pharmacist

## 2016-09-09 ENCOUNTER — Other Ambulatory Visit: Payer: Self-pay | Admitting: Pharmacist

## 2016-09-09 DIAGNOSIS — Z7901 Long term (current) use of anticoagulants: Secondary | ICD-10-CM | POA: Diagnosis not present

## 2016-09-09 DIAGNOSIS — I8290 Acute embolism and thrombosis of unspecified vein: Secondary | ICD-10-CM | POA: Diagnosis not present

## 2016-09-09 LAB — POCT INR: INR: 1.1

## 2016-09-09 MED ORDER — WARFARIN SODIUM 7.5 MG PO TABS: 15.0000 mg | ORAL_TABLET | Freq: Every day | ORAL | 1 refills | Status: DC

## 2016-09-09 NOTE — Patient Instructions (Signed)
Patient instructed to take medications as defined in the Anti-coagulation Track section of this encounter.  Patient instructed to take today's dose.  Patient verbalized understanding of these instructions.    

## 2016-09-09 NOTE — Progress Notes (Signed)
Indication: Recurrent deep venus thrombosis. Duration: Indefinite. INR: Below target. Agree with Dr. Gladstone Pih assessment and plan.

## 2016-09-09 NOTE — Progress Notes (Signed)
Anti-Coagulation Progress Note  Alisha [name] is a 55 y.o. female who is currently on an anti-coagulation regimen.    RECENT RESULTS: Recent results are below, the most recent result is correlated with a dose of 142.5 mg. per week: Lab Results  Component Value Date   INR 1.10 09/09/2016   INR 2.10 08/19/2016   INR 3.50 06/03/2016    ANTI-COAG DOSE:    ANTICOAG SUMMARY: Anticoagulation Episode Summary    Current INR goal:   2.0-3.0  TTR:   24.8 % (1.4 y)  Next INR check:   09/09/2016  INR from last check:   2.10 (08/19/2016)  Most recent INR:    1.10! (09/09/2016)  Weekly max dose:     Target end date:     [facility]  Preferred lab:     Send INR reminders to:      Indications   Venous thromboembolism [I82.90]       Comments:           ANTICOAG TODAY: Anticoagulation Summary  As of 09/09/2016   INR goal:   2.0-3.0  TTR:   24.8 % (1.4 y)  Today's INR:     Next INR check:     Target end date:      Indications   Venous thromboembolism [I82.90]        Anticoagulation Episode Summary    IN[facility] check location[facility]   Preferred lab:      Send INR reminders to:      Comments:         PATIENT INSTRUCTIONS: There are no Patient Instructions on file for this visit.   FOLLOW-UP Return in about 7 days (around 09/16/2016) for Follow up INR at 1015h.  Jorene Guest, III Pharm.D., CACP

## 2016-09-09 NOTE — Telephone Encounter (Signed)
Patient requested refill while at visit for POC PT/INR. Appears refill has already been submitted.

## 2016-09-10 ENCOUNTER — Telehealth: Payer: Self-pay | Admitting: *Deleted

## 2016-09-10 NOTE — Telephone Encounter (Signed)
Pt had called about not receiving albuterol inhaler refill which was done on 9/2. I called [facility] - refill has been ready since 9/2; cost $3.70.; pt was awared and stated she would pick put rx at the end of the week per pharmacy.  Called pt - telephone # not in service.

## 2016-09-16 ENCOUNTER — Ambulatory Visit (INDEPENDENT_AMBULATORY_CARE_PROVIDER_SITE_OTHER): Payer: Medicare Other | Admitting: Pharmacist

## 2016-09-16 DIAGNOSIS — Z7901 Long term (current) use of anticoagulants: Secondary | ICD-10-CM

## 2016-09-16 DIAGNOSIS — I8290 Acute embolism and thrombosis of unspecified vein: Secondary | ICD-10-CM

## 2016-09-16 LAB — POCT INR: INR: 3.4

## 2016-09-16 NOTE — Patient Instructions (Signed)
Patient instructed to take medications as defined in the Anti-coagulation Track section of this encounter.  Patient instructed to OMIT today's dose.  Patient verbalized understanding of these instructions.    

## 2016-09-16 NOTE — Progress Notes (Signed)
Anti-Coagulation Progress Note  Alisha [name] is a 55 y.o. female who is currently on an anti-coagulation regimen.    RECENT RESULTS: Recent results are below, the most recent result is correlated with a dose of 157.5 mg. per week: Lab Results  Component Value Date   INR 3.40 09/16/2016   INR 1.10 09/09/2016   INR 2.10 08/19/2016    ANTI-COAG DOSE: Anticoagulation Dose Instructions as of 09/16/2016      Dorene Grebe Tue Wed Thu Fri Sat   New Dose 18.75 mg 22.5 mg 18.75 mg 22.5 mg 18.75 mg 18.75 mg 22.5 mg    Description   OMIT/HOLD today's dose. Recommence tomorrow, Tuesday 19-SEP-17.       ANTICOAG SUMMARY: Anticoagulation Episode Summary    Current INR goal:   2.0-3.0  TTR:   25.0 % (1.4 y)  Next INR check:   10/07/2016  INR from last check:   3.40! (09/16/2016)  Weekly max dose:     Target end date:     [facility] check [facility]   [facility]  Preferred lab:     Send INR reminders to:      Indications   Venous thromboembolism [I82.90]       Comments:           ANTICOAG TODAY: Anticoagulation Summary  As of 09/16/2016   INR goal:   2.0-3.0  TTR:     Today's INR:   3.40!  Next INR check:   10/07/2016  Target end date:      Indications   Venous thromboembolism [I82.90]        Anticoagulation Episode Summary    [facility] check location[facility]   Preferred lab:      Send INR reminders to:      Comments:         PATIENT INSTRUCTIONS: Patient Instructions  Patient instructed to take medications as defined in the Anti-coagulation Track section of this encounter.  Patient instructed to OMIT today's dose.  Patient verbalized understanding of these instructions.       FOLLOW-UP Return in 3 weeks (on 10/07/2016) for Follow up INR at 1030h.  Jorene Guest, III Pharm.D., CACP

## 2016-09-18 NOTE — Progress Notes (Signed)
INTERNAL MEDICINE TEACHING ATTENDING ADDENDUM - Lucious Groves, DO  Duration- lifelong, Indication- recurrent VTE, INR- 3.4. Agree with pharmacy recommendations as outlined in their note.

## 2016-10-07 ENCOUNTER — Ambulatory Visit (INDEPENDENT_AMBULATORY_CARE_PROVIDER_SITE_OTHER): Payer: Medicare Other

## 2016-10-07 ENCOUNTER — Ambulatory Visit (INDEPENDENT_AMBULATORY_CARE_PROVIDER_SITE_OTHER): Payer: Medicare Other | Admitting: Internal Medicine

## 2016-10-07 VITALS — BP 195/121 | HR 79 | Temp 97.6°F | Ht 64.5 in | Wt 176.6 lb

## 2016-10-07 DIAGNOSIS — M170 Bilateral primary osteoarthritis of knee: Secondary | ICD-10-CM

## 2016-10-07 DIAGNOSIS — M79604 Pain in right leg: Secondary | ICD-10-CM | POA: Diagnosis not present

## 2016-10-07 DIAGNOSIS — I10 Essential (primary) hypertension: Secondary | ICD-10-CM

## 2016-10-07 DIAGNOSIS — G8929 Other chronic pain: Secondary | ICD-10-CM | POA: Diagnosis not present

## 2016-10-07 DIAGNOSIS — Z7982 Long term (current) use of aspirin: Secondary | ICD-10-CM

## 2016-10-07 DIAGNOSIS — F17210 Nicotine dependence, cigarettes, uncomplicated: Secondary | ICD-10-CM | POA: Diagnosis not present

## 2016-10-07 DIAGNOSIS — M79605 Pain in left leg: Secondary | ICD-10-CM | POA: Diagnosis not present

## 2016-10-07 DIAGNOSIS — I8290 Acute embolism and thrombosis of unspecified vein: Secondary | ICD-10-CM | POA: Diagnosis not present

## 2016-10-07 DIAGNOSIS — Z79899 Other long term (current) drug therapy: Secondary | ICD-10-CM

## 2016-10-07 DIAGNOSIS — Z7901 Long term (current) use of anticoagulants: Secondary | ICD-10-CM

## 2016-10-07 DIAGNOSIS — Z23 Encounter for immunization: Secondary | ICD-10-CM | POA: Diagnosis not present

## 2016-10-07 DIAGNOSIS — J45909 Unspecified asthma, uncomplicated: Secondary | ICD-10-CM

## 2016-10-07 DIAGNOSIS — G894 Chronic pain syndrome: Secondary | ICD-10-CM

## 2016-10-07 LAB — POCT INR: INR: 2.1

## 2016-10-07 MED ORDER — HYDROCHLOROTHIAZIDE 25 MG PO TABS: 25.0000 mg | ORAL_TABLET | Freq: Every day | ORAL | 0 refills | Status: DC

## 2016-10-07 MED ORDER — LISINOPRIL 20 MG PO TABS: 20.0000 mg | ORAL_TABLET | Freq: Two times a day (BID) | ORAL | 0 refills | Status: DC

## 2016-10-07 MED ORDER — ALBUTEROL SULFATE HFA 108 (90 BASE) MCG/ACT IN AERS: 1.0000 | INHALATION_SPRAY | RESPIRATORY_TRACT | 3 refills | Status: DC | PRN

## 2016-10-07 MED ORDER — CARVEDILOL 25 MG PO TABS: 25.0000 mg | ORAL_TABLET | Freq: Two times a day (BID) | ORAL | 0 refills | Status: DC

## 2016-10-07 MED ORDER — AMLODIPINE BESYLATE 2.5 MG PO TABS: 2.5000 mg | ORAL_TABLET | Freq: Every day | ORAL | 0 refills | Status: DC

## 2016-10-07 MED ORDER — WARFARIN SODIUM 7.5 MG PO TABS: ORAL_TABLET | ORAL | 1 refills | Status: DC

## 2016-10-07 NOTE — Progress Notes (Addendum)
   CC: Patient is here to discuss her chronic bilateral knee and leg pain.  HPI:  Alisha [name] is a 55 y.o. female with a past medical history of conditions listed below presenting to the clinic to discuss her chronic bilateral knee and leg pain. Patient was also noted to have a high blood pressure at this visit. Please see problem based charting for the status of the patient's current and chronic medical conditions.   Past Medical History:  Diagnosis Date  . Anxiety   . Asthma   . Breast lump 02/2008   Biopsy 05/2008: showed no evidence of malignancy  . Breast mass in female    bi lat   . Chronic pain syndrome   . Degenerative joint disease of knee   . Depression   . 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 Endometrial mass 10/12/2012   Endometrium, biopsy on 11/04/12 - PROLIFERATIVE ENDOMETRIUM AND ABUNDANT MUCUS. NO HYPERPLASIA OR CARCINOMA.   . Hyperlipidemia   . Hypertension   . Irregular menses 9/08  . Joint pain   . Mixed stress and urge urinary incontinence    Being followed by [facility], underwent cystoscopy & uroflowmetry   . Pulmonary edema   . PVD (peripheral vascular disease) ([place])    s/p L fem-pop bypass 11/21/08; graft occluded 12/28/08;  aortogram w/ bilat LE runoff: 1.  bilat diffuse SFA occlusive dz, 2.  Mod to severe above-knee popliteal dz,  3.  Bilat 3-vessel runoff w/ mild tibial occlusive dz.  . Restless leg syndrome   . Sigmoid diverticulitis 10/26/2012  . Tobacco abuse     Review of Systems:  Pertinent positives mentioned in HPI. Remainder of all ROS negative.   Physical Exam:  Vitals:   10/07/16 1112  BP: (!) 195/121  Pulse: 79  Temp: 97.6 F (36.4 C)  TempSrc: Oral  SpO2: 100%  Weight: 176 lb 9.6 oz (80.1 kg)  Height: 5' 4.5" (1.638 m)   Patient seemed very angry and upset. She refused to be examined. Patient was yelling and was not giving me an opportunity to speak.   Assessment &  Plan:   See Encounters Tab for problem based charting.  Patient discussed with Alisha [name]

## 2016-10-07 NOTE — Assessment & Plan Note (Signed)
A BP 195/121 at this visit. Patient seemed very angry and upset. She was requesting pain meds at this visit and refused to discuss her HTN. Stated "My blood pressure is high from pain." Current medication regimen per chart includes Amlodipine 2.5 mg qd, Coreg 25 mg bid, HCTZ 25 mg qd, and Lisinopril 20 mg bid.   P -Medications refilled -RTC in 1 week for BP recheck

## 2016-10-07 NOTE — Progress Notes (Signed)
INTERNAL MEDICINE TEACHING ATTENDING ADDENDUM - Nicko Daher M.D  Duration- indefinite, Indication- recurrent DVT, INR- therapeutic. Agree with pharmacy recommendations as outlined in their note.     

## 2016-10-07 NOTE — Assessment & Plan Note (Addendum)
HPI Patient continues to complain of pain in both her knees and both her legs. She has a history of bilateral knee osteoarthritis. She attributes her bilateral leg pain to PAD. Ankle brachial index was previously ordered and has not been done yet. Patient was also previously referred to pain management but has not seen them yet. She seemed very angry and upset at this visit and refused to be examined. Patient was yelling and was not giving me an opportunity to speak. She was requesting Tramadol. She kept on hitting her fists against her thighs stating "I am in pain." Stated "I am getting out of here if you can't give me a pain medicine." Patient is not able to take NSAIDs because they have not been effective in the past and she is currently on aspirin and Coumadin. She was advised to use Tylenol previously and states it is not helping. States she is currently taking gabapentin 3200 mg per day and it is not helping.  A Pain is multifactorial in etiology: PVD, OA, neuropathic pain, and possibly fibromyalgia.   P -Explained to the patient that she would not be receiving any narcotics from our clinic due to multiple violations in the past. -Advised her to make an appointment with pain management as soon as possible  -Continue gabapentin

## 2016-10-07 NOTE — Patient Instructions (Signed)
Patient instructed to take medications as defined in the Anti-coagulation Track section of this encounter.  Patient instructed to take today's dose.  Patient verbalized understanding of these instructions.    

## 2016-10-07 NOTE — Assessment & Plan Note (Signed)
Albuterol MRI refilled today per patient request.

## 2016-10-07 NOTE — Progress Notes (Signed)
Anti-Coagulation Progress Note  Alisha [name] is a 55 y.o. female who is currently on an anti-coagulation regimen.    RECENT RESULTS: Recent results are below, the most recent result is correlated with a dose of 146.25 mg. per week: Lab Results  Component Value Date   INR 2.10 10/07/2016   INR 3.40 09/16/2016   INR 1.10 09/09/2016    ANTI-COAG DOSE: Anticoagulation Dose Instructions as of 10/07/2016      Dorene Grebe Tue Wed Thu Fri Sat   New Dose 22.5 mg 22.5 mg 22.5 mg 22.5 mg 18.75 mg 22.5 mg 18.75 mg       ANTICOAG SUMMARY: Anticoagulation Episode Summary    Current INR goal:   2.0-3.0  TTR:   26.8 % (1.4 y)  Next INR check:   11/04/2016  INR from last check:   2.10 (10/07/2016)  Weekly max dose:     Target end date:     [facility]  Preferred lab:     Send INR reminders to:      Indications   Venous thromboembolism [I82.90]       Comments:           ANTICOAG TODAY: Anticoagulation Summary  As of 10/07/2016   INR goal:   2.0-3.0  TTR:     Today's INR:   2.10  Next INR check:   11/04/2016  Target end date:      Indications   Venous thromboembolism [I82.90]        Anticoagulation Episode Summary    [facility] check [facility]   Preferred lab:      Send INR reminders to:      Comments:         PATIENT INSTRUCTIONS: There are no Patient Instructions on file for this visit.   FOLLOW-UP Return in about 4 weeks (around 11/04/2016) for Follow up INR at 1015h.  Jorene Guest, III Pharm.D., CACP

## 2016-10-08 ENCOUNTER — Encounter: Payer: Self-pay | Admitting: Internal Medicine

## 2016-10-09 NOTE — Progress Notes (Signed)
Internal Medicine Clinic Attending  Case discussed with Dr. Rathoreat the time of the visit. We reviewed the resident's history and exam and pertinent patient test results. I agree with the assessment, diagnosis, and plan of care documented in the resident's note.  

## 2016-12-03 ENCOUNTER — Emergency Department (HOSPITAL_COMMUNITY)
Admission: EM | Admit: 2016-12-03 | Discharge: 2016-12-03 | Disposition: A | Discharge status: Home / Self Care | Payer: Medicare Other | Attending: Emergency Medicine | Admitting: Emergency Medicine

## 2016-12-03 ENCOUNTER — Encounter (HOSPITAL_COMMUNITY): Payer: Self-pay | Admitting: Emergency Medicine

## 2016-12-03 DIAGNOSIS — Z7982 Long term (current) use of aspirin: Secondary | ICD-10-CM | POA: Insufficient documentation

## 2016-12-03 DIAGNOSIS — F17210 Nicotine dependence, cigarettes, uncomplicated: Secondary | ICD-10-CM | POA: Insufficient documentation

## 2016-12-03 DIAGNOSIS — I10 Essential (primary) hypertension: Secondary | ICD-10-CM | POA: Insufficient documentation

## 2016-12-03 DIAGNOSIS — J45909 Unspecified asthma, uncomplicated: Secondary | ICD-10-CM | POA: Diagnosis not present

## 2016-12-03 DIAGNOSIS — F1910 Other psychoactive substance abuse, uncomplicated: Secondary | ICD-10-CM | POA: Diagnosis not present

## 2016-12-03 DIAGNOSIS — Z79899 Other long term (current) drug therapy: Secondary | ICD-10-CM | POA: Insufficient documentation

## 2016-12-03 DIAGNOSIS — Z7901 Long term (current) use of anticoagulants: Secondary | ICD-10-CM | POA: Insufficient documentation

## 2016-12-03 DIAGNOSIS — F1410 Cocaine abuse, uncomplicated: Secondary | ICD-10-CM

## 2016-12-03 NOTE — Progress Notes (Addendum)
Patient seen in Triage 4 with family present. Patient stated she was in need of residential treatment at this time. CSW begin to explain resources and patient stated for CSW to address the information with her daughter. Patient's daughter stated they had been to the residential places listed on the resource that was going to be given to patient. Daughter stated [facility] has a wait list, [facility] did not have appropriate doctors due to patient's mental and physical condition, and [facility] would only accept one insurance. Patient reports she has Medicaid and Medicare. CSW informed patient that [facility] re[facility] but not detox. Patient and family also encouraged to have patient placed on the wait list at [facility], as they state patient is not on this list. CSW also informed them of a residential facility in [place], [place]. Patient was also informed that this facility has an outpatient facility in [place], [place]. Patient specified that she is in need of a residential treatment facility. Patient reports she receives her medications from [facility], however, she has not had her medications for three months. Patient's daughter asked about places that would accept a payment plan. CSW informed patient and family of uncertainty of any facilities accepting payment plans. CSW encouraged patient and family to ask when contacting facilities.   Staffed with [facility] that the family had contacted places on the resource list. [facility] also made aware that patient has not been on medications recently.    Merry Proud, LCSWA Clinical Social Worker [phone] 3:02 PM

## 2016-12-03 NOTE — ED Provider Notes (Signed)
[facility] DEPT Provider Note   CSN: [id number] Arrival date & time: 12/03/16  1242     History   Chief Complaint Chief Complaint  Patient presents with  . Medical Clearance    Cocaine detox    HPI Alisha [name] is a 55 y.o. female.  The history is provided by the patient.  Mental Health Problem  Presenting symptoms comment:  Substance abuse  Patient accompanied by:  Family member Degree of incapacity (severity):  Moderate Timing:  Intermittent Progression:  Waxing and waning Chronicity:  Chronic Context: drug abuse   Relieved by:  Nothing Associated symptoms: no abdominal pain and no chest pain   She reports that she has been to several places looking for assistance including [facility], [facility], and [facility]. She is currently not awaiting a callback for placement. No SI, HI, or AVH.  Denies any other physical complaints.  Past Medical History:  Diagnosis Date  . Anxiety   . Asthma   . Breast lump 02/2008   Biopsy 05/2008: showed no evidence of malignancy  . Breast mass in female    bi lat   . Chronic pain syndrome   . Degenerative joint disease of knee   . Depression   . 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 Endometrial mass 10/12/2012   Endometrium, biopsy on 11/04/12 - PROLIFERATIVE ENDOMETRIUM AND ABUNDANT MUCUS. NO HYPERPLASIA OR CARCINOMA.   . Hyperlipidemia   . Hypertension   . Irregular menses 9/08  . Joint pain   . Mixed stress and urge urinary incontinence    Being followed by [facility], underwent cystoscopy & uroflowmetry   . Pulmonary edema   . PVD (peripheral vascular disease) ([place])    s/p L fem-pop bypass 11/21/08; graft occluded 12/28/08;  aortogram w/ bilat LE runoff: 1.  bilat diffuse SFA occlusive dz, 2.  Mod to severe above-knee popliteal dz,  3.  Bilat 3-vessel runoff w/ mild tibial occlusive dz.  . Restless leg syndrome   . Sigmoid diverticulitis 10/26/2012  . Tobacco abuse      Patient Active Problem List   Diagnosis Date Noted  . Asthma 10/07/2016  . Colonic diverticular abscess   . Hypertensive urgency 08/30/2014  . Anxiety 08/28/2014  . HTN (hypertension) 08/28/2014  . Breast pain, left 08/11/2014  . Abnormal ECG 01/28/2014  . Health care maintenance 09/16/2013  . Metrorrhagia 10/12/2012  . Endometrial mass 10/12/2012  . 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 Osteoarthritis of both knees 02/15/2012  . Chronic pain syndrome 07/05/2010  . Venous thromboembolism 02/22/2010  . Depression 07/17/2009  . 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 TOBACCO ABUSE 10/20/2006  . Peripheral vascular disease ([place]) 10/20/2006  . HYPERLIPIDEMIA, MIXED 10/10/2006    Past Surgical History:  Procedure Laterality Date  .  Right external iliac artery stent    . BREAST LUMPECTOMY WITH RADIOACTIVE SEED LOCALIZATION Bilateral 05/03/2014   Procedure: BREAST LUMPECTOMY WITH RADIOACTIVE SEED LOCALIZATION;  Surgeon: Joyice Faster. Cornett, MD;  Location: [facility];  Service: General;  Laterality: Bilateral;  . CARDIAC CATHETERIZATION  2/15  . CAROTID ENDARTERECTOMY    . FEMORAL BYPASS  11/09  . LEFT HEART CATHETERIZATION WITH CORONARY ANGIOGRAM N/A 02/25/2014   Procedure: LEFT HEART CATHETERIZATION WITH CORONARY ANGIOGRAM;  Surgeon: Peter M Martinique, MD;  Location: [facility] CATH LAB;  Service: Cardiovascular;  Laterality: N/A;  . PR VEIN BYPASS GRAFT,AORTO-FEM-POP    . Right breast needle-localized lumpectomy    .  TONSILLECTOMY    . TUBAL LIGATION      OB History    Gravida Para Term Preterm AB Living   6 4 4   2 4    SAB TAB Ectopic Multiple Live Births   1 1             Home Medications    Prior to Admission medications   Medication Sig Start Date End Date Taking? Authorizing Provider  albuterol (PROVENTIL HFA;VENTOLIN HFA) 108 (90 Base) MCG/ACT inhaler Inhale 1-2 puffs into the lungs every 4 (four) hours as needed for wheezing or shortness of  breath. 10/07/16   Shela Leff, MD  amLODipine (NORVASC) 2.5 MG tablet Take 1 tablet (2.5 mg total) by mouth daily. 10/07/16   Shela Leff, MD  aspirin EC 81 MG tablet Take 81 mg by mouth daily.    Historical Provider, MD  atorvastatin (LIPITOR) 40 MG tablet Take 1 tablet (40 mg total) by mouth at bedtime. 11/05/13   Ejiroghene Arlyce Dice, MD  buPROPion (WELLBUTRIN XL) 300 MG 24 hr tablet Take 300 mg by mouth daily. 12/11/14   Historical Provider, MD  busPIRone (BUSPAR) 10 MG tablet Take 10 mg by mouth 3 (three) times daily.    Historical Provider, MD  carvedilol (COREG) 25 MG tablet Take 1 tablet (25 mg total) by mouth 2 (two) times daily with a meal. 10/07/16   Shela Leff, MD  diazepam (VALIUM) 5 MG tablet Take 10 mg by mouth 3 (three) times daily.     Historical Provider, MD  diclofenac sodium (VOLTAREN) 1 % GEL Apply 2 g topically 4 (four) times daily. 08/16/15   Norman Herrlich, MD  DULoxetine (CYMBALTA) 60 MG capsule Take 60 mg by mouth daily.     Historical Provider, MD  gabapentin (NEURONTIN) 800 MG tablet TAKE 1 TABLET BY MOUTH IN THE MORNING, 1 TABLET BY MOUTH MIDDAY, AND 2 TABLETS BY MOUTH AT BEDTIME 08/16/15   Norman Herrlich, MD  hydrochlorothiazide (HYDRODIURIL) 25 MG tablet Take 1 tablet (25 mg total) by mouth daily. 10/07/16   Shela Leff, MD  lisinopril (PRINIVIL,ZESTRIL) 20 MG tablet Take 1 tablet (20 mg total) by mouth 2 (two) times daily. 10/07/16   Shela Leff, MD  Multiple Vitamins-Minerals (ONE-A-DAY 50 PLUS PO) Take 1 tablet by mouth daily.    Historical Provider, MD  potassium chloride (K-DUR) 10 MEQ tablet TAKE 1 TABLET BY MOUTH EVERY DAY 08/29/14   Thayer Headings, MD  warfarin (COUMADIN) 7.5 MG tablet Take 3 tablets all days of week EXCEPT on Thursdays and Saturdays--take 2&1/2 tablets. 10/07/16   Nischal Narendra, MD  ziprasidone (GEODON) 60 MG capsule Take 60 mg by mouth 2 (two) times daily with a meal.     Historical Provider, MD    Family  History Family History  Problem Relation Age of Onset  . Breast cancer Mother   . Breast cancer Other     GREAT AUNT  . Colon cancer Neg Hx   . Rectal cancer Neg Hx   . Stomach cancer Neg Hx     Social History Social History  Substance Use Topics  . Smoking status: Current Every Day Smoker    Packs/day: 0.50    Years: 18.00    Types: Cigarettes  . Smokeless tobacco: Never Used     Comment: 1pk 1/2 PACK DAY 2-3 0r none  . Alcohol use 0.0 oz/week     Comment: rare     Allergies   Propoxyphene n-acetaminophen and  Strawberry extract   Review of Systems Review of Systems  Constitutional: Negative for chills and fever.  HENT: Negative for ear pain and sore throat.   Eyes: Negative for pain and visual disturbance.  Respiratory: Negative for cough and shortness of breath.   Cardiovascular: Negative for chest pain and palpitations.  Gastrointestinal: Negative for abdominal pain and vomiting.  Genitourinary: Negative for dysuria and hematuria.  Musculoskeletal: Negative for arthralgias and back pain.  Skin: Negative for color change and rash.  Neurological: Negative for seizures and syncope.  All other systems reviewed and are negative.    Physical Exam Updated Vital Signs BP 123/83   Pulse 72   Temp 97.9 F (36.6 C) (Oral)   Resp 18   SpO2 99%   Physical Exam  Constitutional: She is oriented to person, place, and time. She appears well-developed and well-nourished. No distress.  HENT:  Head: Normocephalic and atraumatic.  Nose: Nose normal.  Eyes: Conjunctivae and EOM are normal. Pupils are equal, round, and reactive to light. Right eye exhibits no discharge. Left eye exhibits no discharge. No scleral icterus.  Neck: Normal range of motion. Neck supple.  Cardiovascular: Normal rate and regular rhythm.  Exam reveals no gallop and no friction rub.   No murmur heard. Pulmonary/Chest: Effort normal and breath sounds normal. No stridor. No respiratory distress. She  has no rales.  Abdominal: Soft. She exhibits no distension. There is no tenderness.  Musculoskeletal: She exhibits no edema or tenderness.  Neurological: She is alert and oriented to person, place, and time.  Skin: Skin is warm and dry. No rash noted. She is not diaphoretic. No erythema.  Psychiatric: Her speech is normal and behavior is normal. Her mood appears anxious. Thought content is not paranoid. She expresses no homicidal and no suicidal ideation.  Vitals reviewed.    ED Treatments / Results  Labs (all labs ordered are listed, but only abnormal results are displayed) Labs Reviewed - No data to display  EKG  EKG Interpretation None       Radiology No results found.  Procedures Procedures (including critical care time)  Medications Ordered in ED Medications - No data to display   Initial Impression / Assessment and Plan / ED Course  I have reviewed the triage vital signs and the nursing notes.  Pertinent labs & imaging results that were available during my care of the patient were reviewed by me and considered in my medical decision making (see chart for details).  Clinical Course     Consulted a Education officer, museum who provided the patient with additional resources. No need for labs at this time. Patient provided with additional resources and instructed to follow up with [facility] and [facility] for continued assistance.  Safe for discharge with strict return precautions.  Final Clinical Impressions(s) / ED Diagnoses   Final diagnoses:  Polysubstance abuse  Cocaine abuse   Disposition: Discharge  Condition: Good  I have discussed the results, Dx and Tx plan with the patient who expressed understanding and agree(s) with the plan. Discharge instructions discussed at great length. The patient was given strict return precautions who verbalized understanding of the instructions. No further questions at time of discharge.      Fatima Blank, MD 12/03/16 [phone]

## 2016-12-03 NOTE — ED Triage Notes (Signed)
Pt reports cocaine use, requesting detox. Last use 3 days ago . Denies SI nor HI. Sts feels hopeless. Pt in tears in triage room. Alert and oriented x 4. Denis pain nor further symptoms.

## 2017-01-09 DIAGNOSIS — F333 Major depressive disorder, recurrent, severe with psychotic symptoms: Secondary | ICD-10-CM | POA: Diagnosis not present

## 2017-02-27 ENCOUNTER — Other Ambulatory Visit: Payer: Self-pay | Admitting: Internal Medicine

## 2017-03-10 ENCOUNTER — Encounter: Attending: Family Medicine | Primary: Family Medicine

## 2017-03-21 ENCOUNTER — Ambulatory Visit: Payer: Self-pay | Admitting: Unknown Physician Specialty

## 2017-04-01 ENCOUNTER — Encounter: Attending: Family Medicine | Primary: Family Medicine

## 2017-04-30 ENCOUNTER — Ambulatory Visit
Admit: 2017-04-30 | Discharge: 2017-04-30 | Payer: PRIVATE HEALTH INSURANCE | Attending: Family Medicine | Primary: Family Medicine

## 2017-04-30 ENCOUNTER — Telehealth

## 2017-04-30 DIAGNOSIS — Z0000 Encounter for general adult medical examination without abnormal findings: Secondary | ICD-10-CM

## 2017-04-30 NOTE — Progress Notes (Signed)
Chief Complaint   Patient presents with   ??? Establish Care   ??? Back Pain

## 2017-04-30 NOTE — Telephone Encounter (Signed)
Patient seen today and notes that doctor mentioned her having a colonoscopy. No referral order on file.    Patient asking to be contacted with information    Call [phone]
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
## 2017-04-30 NOTE — Patient Instructions (Addendum)
GASTROENTEROLOGIST  Jarrett Ables, MD  [facility]  [address]  [place], [place] [address]  Office: [phone]    [facility] - [address] [place]  [address]  [place], [address]  [place], [place] [address]  Phone: [phone]  Fax: [phone]    [facility] - [facility]  [address], [address]  [place], [place] [address]  Phone: [phone]  Fax: [phone]    Lawanna Kobus, M.D.  [place], [place]  [phone]    Rocky Link, M.D.  [facility]  [phone]    [facility]  [address] [place], [place] [address]  [phone]  TDD [phone]  FAX [phone]    [facility]  [address], [address]  [place], [place]  [phone]  [phone]      - REPHRESH Over the counter soap for vaginal discharge       Cocaine Misuse: Care Instructions  Your Care Instructions    Using cocaine can cause physical and mental harm. It can increase your heart rate and blood pressure, which can lead to a heart attack and even death. It can raise your body temperature. You may have nausea, vomiting, and chills. If you smoke cocaine, the fumes can cause breathing problems. If you snort cocaine, it can damage your nasal passages. If you inject cocaine, it can cause an abscess at the injection site or an infection throughout your body. You may become shaky and restless. You also may see or hear things that are not there (hallucinations), or believe things that are not true.  When the doctor treated you, he or she may have:  ?? Watched your symptoms or done tests to find out how much cocaine was in your body.  ?? Treated you to control your heart rate, temperature, and blood pressure.  ?? Given you oxygen to help you breathe.  ?? Given you medicine to settle your thoughts and help keep you calm.   The doctor has checked you carefully, but problems can develop later. If you notice any problems or new symptoms, get medical treatment right away.  How can you care for yourself at home?  ?? When you use cocaine regularly, your body and brain get used to it. This is called dependency. If you are dependent on this drug, you may have withdrawal symptoms when you stop using it. You may feel drowsy, have vivid dreams, or feel hungry, tired, or depressed. You may also feel confused and have trouble thinking clearly. To help get past these symptoms:  ?? Get plenty of rest.  ?? Drink lots of fluids.  ?? Stay active, but don't tire yourself.  ?? Eat a healthy diet.  ?? Talk to your doctor about drug counseling programs that can help you stop using cocaine.  ?? When you stop using cocaine, you may develop abstinence syndrome, which can last for months. Symptoms of this may include feeling depressed, being tired, having trouble concentrating, and craving cocaine.  When should you call for help?  Call 911 anytime you think you may need emergency care. For example, call if:  ? ?? You have symptoms of a heart attack. These may include:  ?? Chest pain or pressure, or a strange feeling in the chest.  ?? Sweating.  ?? Shortness of breath.  ?? Nausea or vomiting.  ?? Pain, pressure, or a strange feeling in the back, neck,  jaw, or upper belly or in one or both shoulders or arms.  ?? A fast or irregular heartbeat.  After you call 911, the operator may tell you to chew 1 adult-strength or 2 to 4 low-dose aspirin. Wait for an ambulance. Do not try to drive yourself.   ? ?? You feel you cannot stop from hurting yourself or someone else.   ?Call your doctor now or seek immediate medical care if:  ? ?? You have severe side effects from using cocaine. These may include problems with thinking, such as seeing things that aren't there or thinking that someone is trying to harm you (paranoia).   ? ?? You have new or worse withdrawal symptoms.    ?Watch closely for changes in your health, and be sure to contact your doctor if:  ? ?? You need more help or support to stop.   Where can you learn more?  Go to [employer].  Enter [phone] in the search box to learn more about "Cocaine Misuse: Care Instructions."  Current as of: November 02, 2015  Content Version: 11.4  ?? 2006-2017 [employer]. Care instructions adapted under license by [employer] (which disclaims liability or warranty for this information). If you have questions about a medical condition or this instruction, always ask your healthcare professional. [employer], Incorporated disclaims any warranty or liability for your use of this information.       Well Visit, Ages 55 to 75: Care Instructions  Your Care Instructions    Physical exams can help you stay healthy. Your doctor has checked your overall health and may have suggested ways to take good care of yourself. He or she also may have recommended tests. At home, you can help prevent illness with healthy eating, regular exercise, and other steps.  Follow-up care is a key part of your treatment and safety. Be sure to make and go to all appointments, and call your doctor if you are having problems. It's also a good idea to know your test results and keep a list of the medicines you take.  How can you care for yourself at home?  ?? Reach and stay at a healthy weight. This will lower your risk for many problems, such as obesity, diabetes, heart disease, and high blood pressure.  ?? Get at least 30 minutes of physical activity on most days of the week. Walking is a good choice. You also may want to do other activities, such as running, swimming, cycling, or playing tennis or team sports. Discuss any changes in your exercise program with your doctor.  ?? Do not smoke or allow others to smoke around you. If you need help quitting, talk to your doctor about stop-smoking programs and medicines.  These can increase your chances of quitting for good.  ?? Talk to your doctor about whether you have any risk factors for sexually transmitted infections (STIs). Having one sex partner (who does not have STIs and does not have sex with anyone else) is a good way to avoid these infections.  ?? Use birth control if you do not want to have children at this time. Talk with your doctor about the choices available and what might be best for you.  ?? Protect your skin from too much sun. When you're outdoors from 10 a.m. to 4 p.m., stay in the shade or cover up with clothing and a hat with a wide brim. Wear sunglasses that block UV rays. Even when it's cloudy, put broad-spectrum sunscreen (SPF  30 or higher) on any exposed skin.  ?? See a dentist one or two times a year for checkups and to have your teeth cleaned.  ?? Wear a seat belt in the car.  ?? Drink alcohol in moderation, if at all. That means no more than 2 drinks a day for men and 1 drink a day for women.  Follow your doctor's advice about when to have certain tests. These tests can spot problems early.  For everyone  ?? Cholesterol. Have the fat (cholesterol) in your blood tested after age 35. Your doctor will tell you how often to have this done based on your age, family history, or other things that can increase your risk for heart disease.  ?? Blood pressure. Have your blood pressure checked during a routine doctor visit. Your doctor will tell you how often to check your blood pressure based on your age, your blood pressure results, and other factors.  ?? Vision. Talk with your doctor about how often to have a glaucoma test.  ?? Diabetes. Ask your doctor whether you should have tests for diabetes.  ?? Colon cancer. Have a test for colon cancer at age 25. You may have one of several tests. If you are younger than 19, you may need a test earlier if you have any risk factors. Risk factors include whether you already had  a precancerous polyp removed from your colon or whether your parent, brother, sister, or child has had colon cancer.  For women  ?? Breast exam and mammogram. Talk to your doctor about when you should have a clinical breast exam and a mammogram. Medical experts differ on whether and how often women under 50 should have these tests. Your doctor can help you decide what is right for you.  ?? Pap test and pelvic exam. Begin Pap tests at age 62. A Pap test is the best way to find cervical cancer. The test often is part of a pelvic exam. Ask how often to have this test.  ?? Tests for sexually transmitted infections (STIs). Ask whether you should have tests for STIs. You may be at risk if you have sex with more than one person, especially if your partners do not wear condoms.  For men  ?? Tests for sexually transmitted infections (STIs). Ask whether you should have tests for STIs. You may be at risk if you have sex with more than one person, especially if you do not wear a condom.  ?? Testicular cancer exam. Ask your doctor whether you should check your testicles regularly.  ?? Prostate exam. Talk to your doctor about whether you should have a blood test (called a PSA test) for prostate cancer. Experts differ on whether and when men should have this test. Some experts suggest it if you are older than 58 and are African-American or have a father or brother who got prostate cancer when he was younger than 52.  When should you call for help?  Watch closely for changes in your health, and be sure to contact your doctor if you have any problems or symptoms that concern you.  Where can you learn more?  Go to [employer].  Enter P072 in the search box to learn more about "Well Visit, Ages 43 to 27: Care Instructions."  Current as of: May 10, 2016  Content Version: 11.4  ?? 2006-2017 [employer], [employer]. Care instructions adapted under  license by [employer] (which disclaims liability or warranty for this information). If you have  questions about a medical condition or this instruction, always ask your healthcare professional. [employer], Inc[employer] disclaims any warranty or liability for your use of this information.       Well Visit, Women 50 to 57: Care Instructions  Your Care Instructions    Physical exams can help you stay healthy. Your doctor has checked your overall health and may have suggested ways to take good care of yourself. He or she also may have recommended tests. At home, you can help prevent illness with healthy eating, regular exercise, and other steps.  Follow-up care is a key part of your treatment and safety. Be sure to make and go to all appointments, and call your doctor if you are having problems. It's also a good idea to know your test results and keep a list of the medicines you take.  How can you care for yourself at home?  ?? Reach and stay at a healthy weight. This will lower your risk for many problems, such as obesity, diabetes, heart disease, and high blood pressure.  ?? Get at least 30 minutes of exercise on most days of the week. Walking is a good choice. You also may want to do other activities, such as running, swimming, cycling, or playing tennis or team sports.  ?? Do not smoke. Smoking can make health problems worse. If you need help quitting, talk to your doctor about stop-smoking programs and medicines. These can increase your chances of quitting for good.  ?? Protect your skin from too much sun. When you're outdoors from 10 a.m. to 4 p.m., stay in the shade or cover up with clothing and a hat with a wide brim. Wear sunglasses that block UV rays. Even when it's cloudy, put broad-spectrum sunscreen (SPF 30 or higher) on any exposed skin.  ?? See a dentist one or two times a year for checkups and to have your teeth cleaned.  ?? Wear a seat belt in the car.   ?? Limit alcohol to 1 drink a day. Too much alcohol can cause health problems.  Follow your doctor's advice about when to have certain tests. These tests can spot problems early.  ?? Cholesterol. Your doctor will tell you how often to have this done based on your age, family history, or other things that can increase your risk for heart attack and stroke.  ?? Blood pressure. Have your blood pressure checked during a routine doctor visit. Your doctor will tell you how often to check your blood pressure based on your age, your blood pressure results, and other factors.  ?? Mammogram. Ask your doctor how often you should have a mammogram, which is an X-ray of your breasts. A mammogram can spot breast cancer before it can be felt and when it is easiest to treat.  ?? Pap test and pelvic exam. Ask your doctor how often you should have a Pap test. You may not need to have a Pap test as often as you used to.  ?? Vision. Have your eyes checked every year or two or as often as your doctor suggests. Some experts recommend that you have yearly exams for glaucoma and other age-related eye problems starting at age 73.  ?? Hearing. Tell your doctor if you notice any change in your hearing. You can have tests to find out how well you hear.  ?? Diabetes. Ask your doctor whether you should have tests for diabetes.  ?? Colon cancer. You should begin tests for colon cancer at age 45.  You may have one of several tests. Your doctor will tell you how often to have tests based on your age and risk. Risks include whether you already had a precancerous polyp removed from your colon or whether your parents, sisters and brothers, or children have had colon cancer.  ?? Thyroid disease. Talk to your doctor about whether to have your thyroid checked as part of a regular physical exam. Women have an increased chance of a thyroid problem.  ?? Osteoporosis. You should begin tests for bone density at age 77. If you  are younger than 55, ask your doctor whether you have factors that may increase your risk for this disease. You may want to have this test before age 6.  ?? Heart attack and stroke risk. At least every 4 to 6 years, you should have your risk for heart attack and stroke assessed. Your doctor uses factors such as your age, blood pressure, cholesterol, and whether you smoke or have diabetes to show what your risk for a heart attack or stroke is over the next 10 years.  When should you call for help?  Watch closely for changes in your health, and be sure to contact your doctor if you have any problems or symptoms that concern you.  Where can you learn more?  Go to [employer].  Enter [phone] in the search box to learn more about "Well Visit, Women 50 to 52: Care Instructions."  Current as of: May 10, 2016  Content Version: 11.4  ?? 2006-2017 [employer], [employer]. Care instructions adapted under license by [employer] (which disclaims liability or warranty for this information). If you have questions about a medical condition or this instruction, always ask your healthcare professional. [employer], [employer] disclaims any warranty or liability for your use of this information.       Learning About Benefits From Quitting Smoking  How does quitting smoking make you healthier?    If you're thinking about quitting smoking, you may have a few reasons to be smoke-free. Your health may be one of them.  ?? When you quit smoking, you lower your risks for cancer, lung disease, heart attack, stroke, blood vessel disease, and blindness from macular degeneration.  ?? When you're smoke-free, you get sick less often, and you heal faster. You are less likely to get colds, flu, bronchitis, and pneumonia.  ?? As a nonsmoker, you may find that your mood is better and you are less stressed.  When and how will you feel healthier?  Quitting has real health benefits that start from day 1 of being  smoke-free. And the longer you stay smoke-free, the healthier you get and the better you feel.  The first hours  ?? After just 20 minutes, your blood pressure and heart rate go down. That means there's less stress on your heart and blood vessels.  ?? Within 12 hours, the level of carbon monoxide in your blood drops back to normal. That makes room for more oxygen. With more oxygen in your body, you may notice that you have more energy than when you smoked.  After 2 weeks  ?? Your lungs start to work better.  ?? Your risk of heart attack starts to drop.  After 1 month  ?? When your lungs are clear, you cough less and breathe deeper, so it's easier to be active.  ?? Your sense of taste and smell return. That means you can enjoy food more than you have since you started smoking.  Over the  years  ?? After 1 year, your risk of heart disease is half what it would be if you kept smoking.  ?? After 5 years, your risk of stroke starts to shrink. Within a few years after that, it's about the same as if you'd never smoked.  ?? After 10 years, your risk of dying from lung cancer is cut by about half. And your risk for many other types of cancer is lower too.  How would quitting help others in your life?  When you quit smoking, you improve the health of everyone who now breathes in your smoke.  ?? Their heart, lung, and cancer risks drop, much like yours.  ?? They are sick less. For babies and small children, living smoke-free means they're less likely to have ear infections, pneumonia, and bronchitis.  ?? If you're a woman who is or will be pregnant someday, quitting smoking means a healthier newborn.  ?? Children who are close to you are less likely to become adult smokers.  Where can you learn more?  Go to [employer]Stats[employer]cahttp://www.healthwise.net/[employer].  Enter O319 in the search box to learn more about "Learning About Benefits From Quitting Smoking."  Current as of: March 18, 2016  Content Version: 11.4   ?? 2006-2017 [employer], [employer]. Care instructions adapted under license by [employer] (which disclaims liability or warranty for this information). If you have questions about a medical condition or this instruction, always ask your healthcare professional. [employer], [employer] disclaims any warranty or liability for your use of this information.       Cocaine Misuse: Care Instructions  Your Care Instructions    Using cocaine can cause physical and mental harm. It can increase your heart rate and blood pressure, which can lead to a heart attack and even death. It can raise your body temperature. You may have nausea, vomiting, and chills. If you smoke cocaine, the fumes can cause breathing problems. If you snort cocaine, it can damage your nasal passages. If you inject cocaine, it can cause an abscess at the injection site or an infection throughout your body. You may become shaky and restless. You also may see or hear things that are not there (hallucinations), or believe things that are not true.  When the doctor treated you, he or she may have:  ?? Watched your symptoms or done tests to find out how much cocaine was in your body.  ?? Treated you to control your heart rate, temperature, and blood pressure.  ?? Given you oxygen to help you breathe.  ?? Given you medicine to settle your thoughts and help keep you calm.  The doctor has checked you carefully, but problems can develop later. If you notice any problems or new symptoms, get medical treatment right away.  How can you care for yourself at home?  ?? When you use cocaine regularly, your body and brain get used to it. This is called dependency. If you are dependent on this drug, you may have withdrawal symptoms when you stop using it. You may feel drowsy, have vivid dreams, or feel hungry, tired, or depressed. You may also feel confused and have trouble thinking clearly. To help get past these symptoms:  ?? Get plenty of rest.   ?? Drink lots of fluids.  ?? Stay active, but don't tire yourself.  ?? Eat a healthy diet.  ?? Talk to your doctor about drug counseling programs that can help you stop using cocaine.  ?? When you stop using cocaine, you  may develop abstinence syndrome, which can last for months. Symptoms of this may include feeling depressed, being tired, having trouble concentrating, and craving cocaine.  When should you call for help?  Call 911 anytime you think you may need emergency care. For example, call if:  ? ?? You have symptoms of a heart attack. These may include:  ?? Chest pain or pressure, or a strange feeling in the chest.  ?? Sweating.  ?? Shortness of breath.  ?? Nausea or vomiting.  ?? Pain, pressure, or a strange feeling in the back, neck, jaw, or upper belly or in one or both shoulders or arms.  ?? A fast or irregular heartbeat.  After you call 911, the operator may tell you to chew 1 adult-strength or 2 to 4 low-dose aspirin. Wait for an ambulance. Do not try to drive yourself.   ? ?? You feel you cannot stop from hurting yourself or someone else.   ?Call your doctor now or seek immediate medical care if:  ? ?? You have severe side effects from using cocaine. These may include problems with thinking, such as seeing things that aren't there or thinking that someone is trying to harm you (paranoia).   ? ?? You have new or worse withdrawal symptoms.   ?Watch closely for changes in your health, and be sure to contact your doctor if:  ? ?? You need more help or support to stop.   Where can you learn more?  Go to [employer].  Enter [phone] in the search box to learn more about "Cocaine Misuse: Care Instructions."  Current as of: November 02, 2015  Content Version: 11.4  ?? 2006-2017 [employer]. Care instructions adapted under license by [employer] (which disclaims liability or warranty for this information). If you have questions about a medical condition or  this instruction, always ask your healthcare professional. [employer] disclaims any warranty or liability for your use of this information.

## 2017-04-30 NOTE — Progress Notes (Signed)
I reviewed with the resident the medical history and the resident's findings on the physical examination.  I discussed with the resident the patient's diagnosis and concur with the plan.

## 2017-05-01 LAB — CBC W/O DIFF
HCT: 40.4 % (ref 34.0–46.6)
HGB: 13.3 g/dL (ref 11.1–15.9)
MCH: 30.1 pg (ref 26.6–33.0)
MCHC: 32.9 g/dL (ref 31.5–35.7)
MCV: 91 fL (ref 79–97)
PLATELET: 316 10*3/uL (ref 150–379)
RBC: 4.42 x10E6/uL (ref 3.77–5.28)
RDW: 13.8 % (ref 12.3–15.4)
WBC: 9.3 10*3/uL (ref 3.4–10.8)

## 2017-05-01 LAB — LIPID PANEL
Cholesterol, total: 197 mg/dL (ref 100–199)
HDL Cholesterol: 55 mg/dL (ref >39)
LDL, calculated: 116 mg/dL — ABNORMAL HIGH (ref 0–99)
Triglyceride: 129 mg/dL (ref 0–149)
VLDL, calculated: 26 mg/dL (ref 5–40)

## 2017-05-01 LAB — METABOLIC PANEL, COMPREHENSIVE
A-G Ratio: 1.9 (ref 1.2–2.2)
ALT (SGPT): 18 IU/L (ref 0–32)
AST (SGOT): 18 IU/L (ref 0–40)
Albumin: 4.4 g/dL (ref 3.5–5.5)
Alk. phosphatase: 106 IU/L (ref 39–117)
BUN/Creatinine ratio: 15 (ref 9–23)
BUN: 14 mg/dL (ref 6–24)
Bilirubin, total: 0.4 mg/dL (ref 0.0–1.2)
CO2: 22 mmol/L (ref 18–29)
Calcium: 9.9 mg/dL (ref 8.7–10.2)
Chloride: 103 mmol/L (ref 96–106)
Creatinine: 0.94 mg/dL (ref 0.57–1.00)
GFR est AA: 80 mL/min/{1.73_m2} (ref >59)
GFR est non-AA: 69 mL/min/{1.73_m2} (ref >59)
GLOBULIN, TOTAL: 2.3 g/dL (ref 1.5–4.5)
Glucose: 73 mg/dL (ref 65–99)
Potassium: 4.4 mmol/L (ref 3.5–5.2)
Protein, total: 6.7 g/dL (ref 6.0–8.5)
Sodium: 142 mmol/L (ref 134–144)

## 2017-05-01 LAB — TSH 3RD GENERATION: TSH: 0.77 u[IU]/mL (ref 0.450–4.500)

## 2017-05-01 LAB — HIV 1/2 AG/AB, 4TH GENERATION,W RFLX CONFIRM: HIV SCREEN 4TH GENERATION WRFX: NONREACTIVE

## 2017-05-01 LAB — HEPATITIS C AB: Hep C Virus Ab: <0.1 s/co ratio (ref 0.0–0.9)

## 2017-05-01 LAB — CVD REPORT

## 2017-05-01 LAB — HEPATITIS C ANTIBODY: HCV Ab: <0.1 s/co ratio (ref 0.0–0.9)

## 2017-05-01 LAB — HIV 1/2 ANTIGEN/ANTIBODY, FOURTH GENERATION W/RFL: HIV Screen 4th Generation wRfx: NONREACTIVE

## 2017-05-01 NOTE — Telephone Encounter (Signed)
Order for colonoscopy placed. I gave the patient information about GI doctors to go and get colonoscopy. Explained to her yesterday that she didn't need to see GI first.     Jennette Dubinharyl Laisha Rau, MD  [facility]  PGY-1

## 2017-05-02 ENCOUNTER — Telehealth

## 2017-05-02 MED ORDER — SIMVASTATIN 20 MG TAB
20 mg | ORAL_TABLET | Freq: Every evening | ORAL | 3 refills | Status: DC
Start: 2017-05-02 — End: 2018-07-13

## 2017-05-02 NOTE — Telephone Encounter (Signed)
Called patient about lab results. All her labs were normal except her lipid panel.  Total cholesterol 197, LDL 116, Trig 129.   10-year risk for heart disease or stroke is 8.7%. Will start Simvastatin 20 mg QHS. Patient agreed with plan.    Debbie Dubinharyl Jermell Holeman, MD  Family Medicine Resident  PGY-1

## 2017-05-02 NOTE — Progress Notes (Signed)
Normal labs except for Lipid panel: Total cholesterol 197, LDL 116, Trig 129. 10-year risk of heart disease or stroke is 8.7%. Will start moderate intensity statin.

## 2017-05-06 ENCOUNTER — Other Ambulatory Visit: Payer: Self-pay | Admitting: Internal Medicine

## 2017-05-06 DIAGNOSIS — I10 Essential (primary) hypertension: Secondary | ICD-10-CM

## 2017-05-06 NOTE — Telephone Encounter (Signed)
Patient did not show for 1 week BP check or reschedule for some other time.  PCP has no available slots before she graduates.  Will provide a 30 day supply with no refills and ask that she be assigned a new PCP now with an appointment within 30 days.  She must present for evaluation to make sure the carvedilol is still the best medication for her before additional refills will be provided.

## 2017-05-14 LAB — 11-DRUG SCREEN, URINE W RFLX CONFIRM
Amphetamines, urine: NEGATIVE ng/mL
Barbiturates: NEGATIVE ng/mL
Benzodiazepines: NEGATIVE ng/mL
Cannabinoids: NEGATIVE ng/mL
Cocaine: POSITIVE — AB
MDMA: NEGATIVE ng/mL
Methadone Screen, Urine: NEGATIVE ng/mL
Methaqualone: NEGATIVE ng/mL
Opiates: NEGATIVE ng/mL
PROPOXYPHENE, URINE: NEGATIVE ng/mL
Phencyclidine: NEGATIVE ng/mL

## 2017-05-14 NOTE — Progress Notes (Signed)
UDS positive for Cocaine

## 2017-05-19 NOTE — Telephone Encounter (Signed)
Per chart pt was dismissed on 01/2017.

## 2017-09-03 ENCOUNTER — Inpatient Hospital Stay: Payer: MEDICAID

## 2017-09-03 LAB — POC H. PYLORI, TISSUE
H. pylori from tissue: POSITIVE
Lot no.: 294127
Negative control: NEGATIVE
Positive control: POSITIVE

## 2017-09-03 MED ORDER — SODIUM CHLORIDE 0.9 % IV
INTRAVENOUS | Status: DC | PRN
Start: 2017-09-03 — End: 2017-09-03
  Administered 2017-09-03: 14:00:00 via INTRAVENOUS

## 2017-09-03 MED ORDER — LIDOCAINE (PF) 20 MG/ML (2 %) IJ SOLN
20 mg/mL (2 %) | INTRAMUSCULAR | Status: DC | PRN
Start: 2017-09-03 — End: 2017-09-03
  Administered 2017-09-03: 14:00:00 via INTRAVENOUS

## 2017-09-03 MED ORDER — SIMETHICONE 40 MG/0.6 ML ORAL DROPS, SUSP
40 mg/0.6 mL | ORAL | Status: DC | PRN
Start: 2017-09-03 — End: 2017-09-03

## 2017-09-03 MED ORDER — SODIUM CHLORIDE 0.9 % IJ SYRG
Freq: Three times a day (TID) | INTRAMUSCULAR | Status: DC
Start: 2017-09-03 — End: 2017-09-03

## 2017-09-03 MED ORDER — FLUMAZENIL 0.1 MG/ML IV SOLN
0.1 mg/mL | INTRAVENOUS | Status: DC | PRN
Start: 2017-09-03 — End: 2017-09-03

## 2017-09-03 MED ORDER — PROPOFOL 10 MG/ML IV EMUL
10 mg/mL | INTRAVENOUS | Status: DC | PRN
Start: 2017-09-03 — End: 2017-09-03
  Administered 2017-09-03 (×4): via INTRAVENOUS

## 2017-09-03 MED ORDER — NALOXONE 0.4 MG/ML INJECTION
0.4 mg/mL | INTRAMUSCULAR | Status: DC | PRN
Start: 2017-09-03 — End: 2017-09-03

## 2017-09-03 MED ORDER — MIDAZOLAM 1 MG/ML IJ SOLN
1 mg/mL | INTRAMUSCULAR | Status: DC | PRN
Start: 2017-09-03 — End: 2017-09-03

## 2017-09-03 MED ORDER — SODIUM CHLORIDE 0.9 % IJ SYRG
INTRAMUSCULAR | Status: DC | PRN
Start: 2017-09-03 — End: 2017-09-03

## 2017-09-03 MED ORDER — EPINEPHRINE 0.1 MG/ML SYRINGE
0.1 mg/mL | Freq: Once | INTRAMUSCULAR | Status: DC | PRN
Start: 2017-09-03 — End: 2017-09-03

## 2017-09-03 MED ORDER — FENTANYL CITRATE (PF) 50 MCG/ML IJ SOLN
50 mcg/mL | INTRAMUSCULAR | Status: DC | PRN
Start: 2017-09-03 — End: 2017-09-03

## 2017-09-03 MED ORDER — SODIUM CHLORIDE 0.9 % IV
INTRAVENOUS | Status: DC
Start: 2017-09-03 — End: 2017-09-03

## 2017-09-03 MED ORDER — ATROPINE 0.1 MG/ML SYRINGE
0.1 mg/mL | Freq: Once | INTRAMUSCULAR | Status: DC | PRN
Start: 2017-09-03 — End: 2017-09-03

## 2017-09-03 MED FILL — SODIUM CHLORIDE 0.9 % IV: INTRAVENOUS | Qty: 1000

## 2017-09-03 MED FILL — XYLOCAINE-MPF 20 MG/ML (2 %) INJECTION SOLUTION: 20 mg/mL (2 %) | INTRAMUSCULAR | Qty: 2

## 2017-09-03 MED FILL — DIPRIVAN 10 MG/ML INTRAVENOUS EMULSION: 10 mg/mL | INTRAVENOUS | Qty: 25

## 2017-09-03 NOTE — Anesthesia Pre-Procedure Evaluation (Addendum)
Anesthetic History   No history of anesthetic complications            Review of Systems / Medical History  Patient summary reviewed, nursing notes reviewed and pertinent labs reviewed    Pulmonary          Smoker         Neuro/Psych   Within defined limits           Cardiovascular  Within defined limits                     GI/Hepatic/Renal  Within defined limits              Endo/Other  Within defined limits           Other Findings   Comments: Cocaine abuse         Physical Exam    Airway  Mallampati: II  TM Distance: > 6 cm  Neck ROM: normal range of motion   Mouth opening: Normal     Cardiovascular  Regular rate and rhythm,  S1 and S2 normal,  no murmur, click, rub, or gallop             Dental  No notable dental hx       Pulmonary  Breath sounds clear to auscultation               Abdominal  GI exam deferred       Other Findings            Anesthetic Plan    ASA: 2  Anesthesia type: MAC          Induction: Intravenous  Anesthetic plan and risks discussed with: Patient

## 2017-09-03 NOTE — Discharge Summary (Signed)
[place] [facility]                 DISCHARGE INSTRUCTIONS    Orinda KennerFrances E Scott  [id number]  02-17-1961    DISCOMFORT:  Redness at IV site- apply warm compress to area; if redness or soreness persist- contact your physician  There may be a slight amount of blood passed from the rectum  Gaseous discomfort- walking, belching will help relieve any discomfort  You may not operate a vehicle for 12 hours  You may not  engage in an occupation involving machinery or appliances for rest of today  You may not  drink alcoholic beverages for at least 12 hours  Avoid making any critical decisions for at least 24 hour    DIET:   High fiber diet.   ??? however -  remember your colon is empty and a heavy meal will produce gas.   Avoid these foods:  vegetables, fried / greasy foods, carbonated drinks for today      ACTIVITY  You may resume your normal daily activities   Spend the remainder of the day resting -  avoid any strenuous activity.    CALL M.D.  ANY SIGN OF   Increasing pain, nausea, vomiting  Abdominal distension (swelling)  New increased bleeding (oral or rectal)  Fever (chills)  Pain in chest area  Bloody discharge from nose or mouth  Shortness of breath        ENDOSCOPY FINDINGS:   Your endoscopy showed reflux and gastritis. Your colonoscopy was normal.    You may  take any Advil, Aspirin, Ibuprofen, Motrin, Aleve, or Goody???s for 10 days, ONLY  Tylenol as needed for pain.        Follow-up Instructions:   Call Dr. Christy GentlesSeeman for any questions or problems.     If we took a biopsy please call the office within 2 weeks to discuss your                             pathology results. Telephone # [phone]

## 2017-09-03 NOTE — Procedures (Signed)
Procedures  by Pollyann Kennedy, MD at 09/03/17 1016                Author: Pollyann Kennedy, MD  Service: Gastroenterology  Author Type: Physician       Filed: 09/03/17 1018  Date of Service: 09/03/17 1016  Status: Signed          Editor: Pollyann Kennedy, MD (Physician)            Pre-procedure Diagnoses        1. Pain of upper abdomen [R10.10]                           Post-procedure Diagnoses        1. Reflux esophagitis [K21.0]        2. Acute superficial gastritis without hemorrhage [K29.00]                           Procedures        1. UPPER GI ENDOSCOPY,BIOPSY [[id number]]                              [place]   [place], [place]                       Operator:  Pollyann Kennedy, MD      Referring Provider: Arnell Sieving, MD      Sedation:  MAC anesthesia Propofol         Prior to the procedure its objectives, risks, consequences and alternatives were discussed with the patient who then elected to proceed. The patient had the opportunity to ask questions and those questions were answered. A physical exam was performed.  The heart, lungs, and mental status were examined prior to the procedure and found to be satisfactory for conscious sedation and for the procedure. Conscious sedation was initiated by the physician. Continuous pulse oximetry and blood pressure monitoring  were used throughout the procedure.       After appropriate pharyngeal anesthesia, the endoscope was passed into the esophagus without difficulty. The proximal esophagus is normal. In the distal esophagus there is grade 1  reflux esophagitis. The scope was then passed into the stomach without difficulty. The fundus is normal.  In the body and antrum there is mild  gastritis. The pylorus, bulb and postbulbar area are unremarkable. A biopsy was obtained for Pyloritek from the gastric antrum and body.  On slow withdrawal of the scope, retroflexion confirmed a normal cardia and fundus. She  tolerated the procedure without complications  and will be discharged later today.      Specimen:  Biopsy for Pyloritek was obtained      Complications: None.       EBL:  None.      Pollyann Kennedy, MD   09/03/2017 10:18 AM

## 2017-09-03 NOTE — Procedures (Signed)
Procedures  by Pollyann Kennedy, MD at 09/03/17 1019                Author: Pollyann Kennedy, MD  Service: Gastroenterology  Author Type: Physician       Filed: 09/03/17 1022  Date of Service: 09/03/17 1019  Status: Signed          Editor: Pollyann Kennedy, MD (Physician)            Pre-procedure Diagnoses        1. Special screening for malignant neoplasms, colon [Z12.11]                           Post-procedure Diagnoses        1. Special screening for malignant neoplasms, colon [Z12.11]                           Procedures        1. COLONOSCOPY,DIAGNOSTIC [place]   [address]   [place], [place]                   Operator:  Pollyann Kennedy, MD      Referring Provider: Arnell Sieving, MD      Sedation:  MAC anesthesia Propofol            Prior to the procedure its objectives, risks, consequences and alternatives were discussed with the patient who then elected to proceed. The patient had the opportunity to ask questions and those questions were answered. A physical exam was performed.  The heart, lungs, and mental status were examined prior to the procedure and found to be satisfactory for conscious sedation and for the procedure. Conscious sedation was initiated by the physician. Continuous pulse oximetry and blood pressure monitoring  were used throughout the procedure.       After appropriate analgesia and rectal exam, the colonoscope was passed into the anus and passed to the cecum without difficulty. The prep was good . On slow withdrawal of the scope, the cecum, ascending colon, hepatic flexure, transverse colon, splenic flexure, descending colon, sigmoid and rectum were normal. Retroflexion exam of the rectum was normal.  She tolerated the procedure without complication and I recommend a repeat colonoscopy in  10 years.      Specimen none      Complications: None.       EBL:  None.      Pollyann Kennedy, MD   09/03/2017  10:22 AM

## 2017-09-03 NOTE — Discharge Summary (Signed)
- [facility]                 DISCHARGE INSTRUCTIONS    Debbie Scott  [id number]  04/06/1961    DISCOMFORT:  Redness at IV site- apply warm compress to area; if redness or soreness persist- contact your physician  There may be a slight amount of blood passed from the rectum  Gaseous discomfort- walking, belching will help relieve any discomfort  You may not operate a vehicle for 12 hours  You may not  engage in an occupation involving machinery or appliances for rest of today  You may not  drink alcoholic beverages for at least 12 hours  Avoid making any critical decisions for at least 24 hour    DIET:   High fiber diet.   ??? however -  remember your colon is empty and a heavy meal will produce gas.   Avoid these foods:  vegetables, fried / greasy foods, carbonated drinks for today      ACTIVITY  You may resume your normal daily activities   Spend the remainder of the day resting -  avoid any strenuous activity.    CALL M.D.  ANY SIGN OF   Increasing pain, nausea, vomiting  Abdominal distension (swelling)  New increased bleeding (oral or rectal)  Fever (chills)  Pain in chest area  Bloody discharge from nose or mouth  Shortness of breath        ENDOSCOPY FINDINGS:   Your endoscopy showed reflux and gastritis. Your colonoscopy was normal.    You may  take any Advil, Aspirin, Ibuprofen, Motrin, Aleve, or Goody???s for 10 days, ONLY  Tylenol as needed for pain.        Follow-up Instructions:   Call Dr. Talise Sligh for any questions or problems.     If we took a biopsy please call the office within 2 weeks to discuss your                             pathology results. Telephone # [phone]

## 2017-09-03 NOTE — Anesthesia Post-Procedure Evaluation (Signed)
Post-Anesthesia Evaluation and Assessment    Patient: Debbie Scott MRN: [id number]  SSN: [id number]    Date of Birth: 08-28-1961  Age: 56 y.o.  Sex: female       Cardiovascular Function/Vital Signs  Visit Vitals   ??? BP 127/79   ??? Pulse 76   ??? Temp 36.7 ??C (98 ??F)   ??? Resp 12   ??? Ht 5' 4"  (1.626 m)   ??? Wt 80.7 kg (178 lb)   ??? SpO2 99%   ??? Breastfeeding No   ??? BMI 30.55 kg/m2       Patient is status post MAC anesthesia for Procedure(s):  COLONOSCOPY  ESOPHAGOGASTRODUODENOSCOPY (EGD)  ESOPHAGOGASTRODUODENAL (EGD) BIOPSY.    Nausea/Vomiting: None    Postoperative hydration reviewed and adequate.    Pain:  Pain Scale 1: Numeric (0 - 10) (09/03/17 0951)  Pain Intensity 1: 0 (09/03/17 0951)   Managed    Neurological Status:       At baseline    Mental Status and Level of Consciousness: Arousable    Pulmonary Status:   O2 Device: Room air (09/03/17 0927)   Adequate oxygenation and airway patent    Complications related to anesthesia: None    Post-anesthesia assessment completed. No concerns    Signed By: Anthony Sar, MD     September 03, 2017

## 2017-09-03 NOTE — Other (Signed)
Orinda KennerFrances E Kugel  05/18/1961  [id number]    Situation:  Verbal report received from: Matthias HughsKecia  Procedure: Procedure(s):  COLONOSCOPY  ESOPHAGOGASTRODUODENOSCOPY (EGD)  ESOPHAGOGASTRODUODENAL (EGD) BIOPSY    Background:    Preoperative diagnosis: EPIGASTRIC PAIN, SCREENING  Postoperative diagnosis: * No post-op diagnosis entered *    Operator:  Dr. Christy GentlesSeeman  Assistant(s): Endoscopy Technician-1: Orpah CobbAshley C Jones  Endoscopy RN-1: Jerene DillingKecia L Blaschick, RN    Specimens: * No specimens in log *  H. Pylori  no    Assessment:  Intra-procedure medications     Anesthesia gave intra-procedure sedation and medications, see anesthesia flow sheet yes    Intravenous fluids: NS@ KVO     Vital signs stable     Abdominal assessment: round and soft     Recommendation:  Discharge patient per MD order.    Family or Friend   Permission to share finding with family or friend yes

## 2017-09-03 NOTE — H&P (Signed)
[place]  [place], [place]                 Debbie Scott is a  56 y.o. African American female who presents with epigastric pain and for screening colonoscopy .        Past Medical History:   Diagnosis Date   ??? Anxiety and depression    ??? Blind left eye    ??? PTSD (post-traumatic stress disorder)      No past surgical history on file.  No Known Allergies  Current Facility-Administered Medications   Medication Dose Route Frequency Provider Last Rate Last Dose   ??? 0.9% sodium chloride infusion  50 mL/hr IntraVENous CONTINUOUS Pollyann Kennedy, MD       ??? sodium chloride (NS) flush 5-10 mL  5-10 mL IntraVENous Q8H Pollyann Kennedy, MD       ??? sodium chloride (NS) flush 5-10 mL  5-10 mL IntraVENous PRN Pollyann Kennedy, MD       ??? midazolam (VERSED) injection 0.25-5 mg  0.25-5 mg IntraVENous Multiple Pollyann Kennedy, MD       ??? fentaNYL citrate (PF) injection 100 mcg  100 mcg IntraVENous Multiple Pollyann Kennedy, MD       ??? naloxone [facility]) injection 0.4 mg  0.4 mg IntraVENous Multiple Pollyann Kennedy, MD       ??? flumazenil (ROMAZICON) 0.1 mg/mL injection 0.2 mg  0.2 mg IntraVENous Multiple Pollyann Kennedy, MD       ??? simethicone (MYLICON) 43XV/4.0GQ oral drops 80 mg  1.2 mL Oral Multiple Pollyann Kennedy, MD       ??? atropine injection 0.5 mg  0.5 mg IntraVENous ONCE PRN Pollyann Kennedy, MD       ??? EPINEPHrine (ADRENALIN) 0.1 mg/mL syringe 1 mg  1 mg Endoscopically ONCE PRN Pollyann Kennedy, MD           Visit Vitals   ??? BP 127/79   ??? Pulse 76   ??? Temp 98 ??F (36.7 ??C)   ??? Resp 12   ??? Ht 5' 4"  (1.626 m)   ??? Wt 80.7 kg (178 lb)   ??? SpO2 99%   ??? BMI 30.55 kg/m2           PHYSICAL EXAM:  General: WD, WN. Alert, cooperative, no acute distress????  HEENT: NC, Atraumatic.  PERRLA, EOMI. Anicteric sclerae. Mallampati score 2  Lungs:  CTA Bilaterally. No Wheezing/Rhonchi/Rales.  Heart:  Regular  rhythm,?? No murmur (), No Rubs, No Gallops   Abdomen: Soft, Non distended, Non tender. ??+Bowel sounds, no HSM  Extremities: No c/c/e  Neurologic:?? CN 2-12 gi, Alert and oriented X 3.  No acute neurological distress   Psych:???? Good insight.??Not anxious nor agitated.    Plan:   Endoscopic procedure with MAC.    Pollyann Kennedy, MD  09/03/2017  9:55 AM

## 2017-09-03 NOTE — Procedures (Signed)
[place]  [place], [place]              Operator:  Pollyann Kennedy, MD    Referring Provider: Arnell Sieving, MD    Sedation:  MAC anesthesia Propofol        Prior to the procedure its objectives, risks, consequences and alternatives were discussed with the patient who then elected to proceed. The patient had the opportunity to ask questions and those questions were answered. A physical exam was performed. The heart, lungs, and mental status were examined prior to the procedure and found to be satisfactory for conscious sedation and for the procedure. Conscious sedation was initiated by the physician. Continuous pulse oximetry and blood pressure monitoring were used throughout the procedure.     After appropriate analgesia and rectal exam, the colonoscope was passed into the anus and passed to the cecum without difficulty. The prep was good. On slow withdrawal of the scope, the cecum, ascending colon, hepatic flexure, transverse colon, splenic flexure, descending colon, sigmoid and rectum were normal. Retroflexion exam of the rectum was normal. She tolerated the procedure without complication and I recommend a repeat colonoscopy in 10 years.    Specimen none    Complications: None.     EBL:  None.    Pollyann Kennedy, MD  09/03/2017  10:22 AM

## 2017-09-03 NOTE — Progress Notes (Signed)
Initial RN admission and assessment performed and documented in Endoscopy navigator.     Patient evaluated by anesthesia in pre-procedure holding.     All procedural vital signs, airway assessment, and level of consciousness information monitored and recorded by anesthesia staff on the anesthesia record.     Report received from CRNA post procedure.  Patient transported to recovery area by RN.    Endoscope was pre-cleaned at bedside immediately following procedure by Ashley J.

## 2017-09-03 NOTE — Procedures (Signed)
[place]  [place], [place]                  Operator:  Pollyann Kennedy, MD    Referring Provider: Arnell Sieving, MD    Sedation:  MAC anesthesia Propofol      Prior to the procedure its objectives, risks, consequences and alternatives were discussed with the patient who then elected to proceed. The patient had the opportunity to ask questions and those questions were answered. A physical exam was performed. The heart, lungs, and mental status were examined prior to the procedure and found to be satisfactory for conscious sedation and for the procedure. Conscious sedation was initiated by the physician. Continuous pulse oximetry and blood pressure monitoring were used throughout the procedure.     After appropriate pharyngeal anesthesia, the endoscope was passed into the esophagus without difficulty. The proximal esophagus is normal. In the distal esophagus there is grade 1 reflux esophagitis. The scope was then passed into the stomach without difficulty. The fundus is normal.  In the body and antrum there is mild gastritis. The pylorus, bulb and postbulbar area are unremarkable. A biopsy was obtained for Pyloritek from the gastric antrum and body. On slow withdrawal of the scope, retroflexion confirmed a normal cardia and fundus. She tolerated the procedure without complications and will be discharged later today.    Specimen:  Biopsy for Pyloritek was obtained    Complications: None.     EBL:  None.    Pollyann Kennedy, MD  09/03/2017 10:18 AM

## 2017-10-06 ENCOUNTER — Emergency Department (HOSPITAL_COMMUNITY)
Admission: EM | Admit: 2017-10-06 | Discharge: 2017-10-07 | Disposition: A | Discharge status: Other Acute Inpatient Hospital | Payer: Medicare Other | Attending: Emergency Medicine | Admitting: Emergency Medicine

## 2017-10-06 DIAGNOSIS — I739 Peripheral vascular disease, unspecified: Secondary | ICD-10-CM | POA: Diagnosis not present

## 2017-10-06 DIAGNOSIS — Z7982 Long term (current) use of aspirin: Secondary | ICD-10-CM | POA: Insufficient documentation

## 2017-10-06 DIAGNOSIS — F419 Anxiety disorder, unspecified: Secondary | ICD-10-CM | POA: Insufficient documentation

## 2017-10-06 DIAGNOSIS — R45851 Suicidal ideations: Secondary | ICD-10-CM

## 2017-10-06 DIAGNOSIS — F329 Major depressive disorder, single episode, unspecified: Secondary | ICD-10-CM | POA: Diagnosis present

## 2017-10-06 DIAGNOSIS — I10 Essential (primary) hypertension: Secondary | ICD-10-CM | POA: Diagnosis not present

## 2017-10-06 DIAGNOSIS — F1410 Cocaine abuse, uncomplicated: Secondary | ICD-10-CM

## 2017-10-06 DIAGNOSIS — F17210 Nicotine dependence, cigarettes, uncomplicated: Secondary | ICD-10-CM | POA: Insufficient documentation

## 2017-10-06 DIAGNOSIS — Z79899 Other long term (current) drug therapy: Secondary | ICD-10-CM | POA: Insufficient documentation

## 2017-10-06 DIAGNOSIS — I8290 Acute embolism and thrombosis of unspecified vein: Secondary | ICD-10-CM

## 2017-10-06 DIAGNOSIS — J45909 Unspecified asthma, uncomplicated: Secondary | ICD-10-CM | POA: Diagnosis not present

## 2017-10-06 LAB — CBC
HEMATOCRIT: 46.8 % — AB (ref 36.0–46.0)
HEMOGLOBIN: 15.9 g/dL — AB (ref 12.0–15.0)
MCH: 33.1 pg (ref 26.0–34.0)
MCHC: 34 g/dL (ref 30.0–36.0)
MCV: 97.3 fL (ref 78.0–100.0)
Platelets: 225 10*3/uL (ref 150–400)
RBC: 4.81 MIL/uL (ref 3.87–5.11)
RDW: 12.7 % (ref 11.5–15.5)
WBC: 10 10*3/uL (ref 4.0–10.5)

## 2017-10-06 LAB — PROTIME-INR
INR: 0.98
PROTHROMBIN TIME: 12.9 s (ref 11.4–15.2)

## 2017-10-06 LAB — COMPREHENSIVE METABOLIC PANEL
ALT: 13 U/L — ABNORMAL LOW (ref 14–54)
AST: 22 U/L (ref 15–41)
Albumin: 4.3 g/dL (ref 3.5–5.0)
Alkaline Phosphatase: 76 U/L (ref 38–126)
Anion gap: 7 (ref 5–15)
BUN: 17 mg/dL (ref 6–20)
CHLORIDE: 107 mmol/L (ref 101–111)
CO2: 28 mmol/L (ref 22–32)
Calcium: 9.9 mg/dL (ref 8.9–10.3)
Creatinine, Ser: 1.13 mg/dL — ABNORMAL HIGH (ref 0.44–1.00)
GFR, EST NON AFRICAN AMERICAN: 54 mL/min — AB (ref >60)
Glucose, Bld: 92 mg/dL (ref 65–99)
POTASSIUM: 4.5 mmol/L (ref 3.5–5.1)
Sodium: 142 mmol/L (ref 135–145)
Total Bilirubin: 0.6 mg/dL (ref 0.3–1.2)
Total Protein: 7.4 g/dL (ref 6.5–8.1)

## 2017-10-06 LAB — RAPID URINE DRUG SCREEN, HOSP PERFORMED
Amphetamines: NOT DETECTED
BARBITURATES: NOT DETECTED
Benzodiazepines: NOT DETECTED
Cocaine: POSITIVE — AB
Opiates: NOT DETECTED
TETRAHYDROCANNABINOL: NOT DETECTED

## 2017-10-06 LAB — ETHANOL: Alcohol, Ethyl (B): <10 mg/dL (ref <10)

## 2017-10-06 MED ORDER — HYDROCHLOROTHIAZIDE 25 MG PO TABS: 25.0000 mg | ORAL_TABLET | Freq: Every day | ORAL | 0 refills | Status: DC

## 2017-10-06 MED ORDER — DIAZEPAM 5 MG PO TABS
10.0000 mg | ORAL_TABLET | Freq: Three times a day (TID) | ORAL | Status: DC
  Administered 2017-10-06: 10 mg via ORAL
  Filled 2017-10-06: qty 2

## 2017-10-06 MED ORDER — BUPROPION HCL ER (XL) 150 MG PO TB24: 300.0000 mg | ORAL_TABLET | Freq: Every day | ORAL | Status: DC

## 2017-10-06 MED ORDER — WARFARIN SODIUM 7.5 MG PO TABS: ORAL_TABLET | ORAL | 1 refills | Status: DC

## 2017-10-06 MED ORDER — LISINOPRIL 20 MG PO TABS
20.0000 mg | ORAL_TABLET | Freq: Two times a day (BID) | ORAL | Status: DC
  Administered 2017-10-06: 20 mg via ORAL
  Filled 2017-10-06: qty 1

## 2017-10-06 MED ORDER — WARFARIN - PHYSICIAN DOSING INPATIENT: Freq: Every day | Status: DC

## 2017-10-06 MED ORDER — AMLODIPINE BESYLATE 5 MG PO TABS: 2.5000 mg | ORAL_TABLET | Freq: Every day | ORAL | Status: DC

## 2017-10-06 MED ORDER — WARFARIN SODIUM 7.5 MG PO TABS
7.5000 mg | ORAL_TABLET | Freq: Every day | ORAL | Status: AC
  Administered 2017-10-06: 7.5 mg via ORAL
  Filled 2017-10-06: qty 1

## 2017-10-06 MED ORDER — CARVEDILOL 25 MG PO TABS: 25.0000 mg | ORAL_TABLET | Freq: Two times a day (BID) | ORAL | 0 refills | Status: DC

## 2017-10-06 MED ORDER — LISINOPRIL 20 MG PO TABS: 20.0000 mg | ORAL_TABLET | Freq: Two times a day (BID) | ORAL | 0 refills | Status: DC

## 2017-10-06 MED ORDER — AMLODIPINE BESYLATE 2.5 MG PO TABS: 2.5000 mg | ORAL_TABLET | Freq: Every day | ORAL | 0 refills | Status: DC

## 2017-10-06 MED ORDER — CARVEDILOL 12.5 MG PO TABS: 25.0000 mg | ORAL_TABLET | Freq: Two times a day (BID) | ORAL | Status: DC

## 2017-10-06 MED ORDER — ZIPRASIDONE HCL 60 MG PO CAPS
60.0000 mg | ORAL_CAPSULE | Freq: Two times a day (BID) | ORAL | Status: DC
  Filled 2017-10-06: qty 1

## 2017-10-06 MED ORDER — HYDROCHLOROTHIAZIDE 25 MG PO TABS: 25.0000 mg | ORAL_TABLET | Freq: Every day | ORAL | Status: DC

## 2017-10-06 NOTE — ED Triage Notes (Signed)
Pt arrives via POv from home with [facility] ED referral to ED for medical clearance. Pt states she's dead inside and doesn't want to live anymore. States no one will listen to me. Does not endorse actual plan to harm self.Stopped taking all medications. Has cocaine abuse disorder per notes. Pt alert, oriented x4. Cooperative.

## 2017-10-06 NOTE — ED Notes (Signed)
Pt. Dropped off by [facility], and stated, if she is not admitted she needs to come back to [facility] , we will come pick her up. Pt. Upset at Nurse 1st and crying during periods of time. Upset the person who dropped her off left her here.  # at [place]

## 2017-10-06 NOTE — ED Provider Notes (Addendum)
[place] DEPT Provider Note   CSN: [id number] Arrival date & time: 10/06/17  1831     History   Chief Complaint Chief Complaint  Patient presents with  . Suicidal    HPI Alisha [name] is a 56 y.o. female.  HPI Pt presents for medical clearance.  Pt has been having trouble with depression.  She is very tearful and hopeless.  She is feeling suicidal. She went to [facility] today and was sent to the ED for medical clearance.  Pt admits to not taking her blood pressure medications.  She also admits to using cocaine today.  She denies any chest pain or shortness of breath.   Past Medical History:  Diagnosis Date  . Anxiety   . Asthma   . Breast lump 02/2008   Biopsy 05/2008: showed no evidence of malignancy  . Breast mass in female    bi lat   . Chronic pain syndrome   . Degenerative joint disease of knee   . Depression   . 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 Endometrial mass 10/12/2012   Endometrium, biopsy on 11/04/12 - PROLIFERATIVE ENDOMETRIUM AND ABUNDANT MUCUS. NO HYPERPLASIA OR CARCINOMA.   . Hyperlipidemia   . Hypertension   . Irregular menses 9/08  . Joint pain   . Mixed stress and urge urinary incontinence    [facility], underwent cystoscopy & uroflowmetry   . Pulmonary edema   . PVD (peripheral vascular disease) ([place])    s/p L fem-pop bypass 11/21/08; graft occluded 12/28/08;  aortogram w/ bilat LE runoff: 1.  bilat diffuse SFA occlusive dz, 2.  Mod to severe above-knee popliteal dz,  3.  Bilat 3-vessel runoff w/ mild tibial occlusive dz.  . Restless leg syndrome   . Sigmoid diverticulitis 10/26/2012  . Tobacco abuse     Patient Active Problem List   Diagnosis Date Noted  . Asthma 10/07/2016  . Colonic diverticular abscess   . Anxiety 08/28/2014  . Breast pain, left 08/11/2014  . Abnormal ECG 01/28/2014  . Health care maintenance 09/16/2013  . Metrorrhagia 10/12/2012  .  Endometrial mass 10/12/2012  . 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 Osteoarthritis of both knees 02/15/2012  . Chronic pain syndrome 07/05/2010  . Venous thromboembolism 02/22/2010  . Depression 07/17/2009  . 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 TOBACCO ABUSE 10/20/2006  . Essential hypertension 10/20/2006  . Peripheral vascular disease ([place]) 10/20/2006  . HYPERLIPIDEMIA, MIXED 10/10/2006    Past Surgical History:  Procedure Laterality Date  .  Right external iliac artery stent    . BREAST LUMPECTOMY WITH RADIOACTIVE SEED LOCALIZATION Bilateral 05/03/2014   Procedure: BREAST LUMPECTOMY WITH RADIOACTIVE SEED LOCALIZATION;  Surgeon: Joyice Faster. Cornett, MD;  Location: [place];  Service: General;  Laterality: Bilateral;  . CARDIAC CATHETERIZATION  2/15  . CAROTID ENDARTERECTOMY    . FEMORAL BYPASS  11/09  . LEFT HEART CATHETERIZATION WITH CORONARY ANGIOGRAM N/A 02/25/2014   Procedure: LEFT HEART CATHETERIZATION WITH CORONARY ANGIOGRAM;  Surgeon: Peter M Martinique, MD;  Location: [facility] C[facility] LAB;  Service: Cardiovascular;  Laterality: N/A;  . PR VEIN BYPASS GRAFT,AORTO-FEM-POP    . Right breast needle-localized lumpectomy    . TONSILLECTOMY    . TUBAL LIGATION      OB History    Gravida Para Term Preterm AB Living   6 4 4   2 4    SAB TAB Ectopic  Multiple Live Births   1 1             Home Medications    Prior to Admission medications   Medication Sig Start Date End Date Taking? Authorizing Provider  albuterol (PROVENTIL HFA;VENTOLIN HFA) 108 (90 Base) MCG/ACT inhaler Inhale 1-2 puffs into the lungs every 4 (four) hours as needed for wheezing or shortness of breath. 10/07/16  Yes Shela Leff, MD  amLODipine (NORVASC) 2.5 MG tablet Take 1 tablet (2.5 mg total) by mouth daily. 10/06/17   Dorie Rank, MD  aspirin EC 81 MG tablet Take 81 mg by mouth daily.    [provider]  atorvastatin (LIPITOR) 40 MG tablet Take 1 tablet (40 mg total) by  mouth at bedtime. Patient not taking: Reported on 10/06/2017 11/05/13   Bethena Roys, MD  buPROPion (WELLBUTRIN XL) 300 MG 24 hr tablet Take 300 mg by mouth daily. 12/11/14   [provider]  busPIRone (BUSPAR) 10 MG tablet Take 10 mg by mouth 3 (three) times daily.    [provider]  carvedilol (COREG) 25 MG tablet Take 1 tablet (25 mg total) by mouth 2 (two) times daily with a meal. 10/06/17   Dorie Rank, MD  diazepam (VALIUM) 5 MG tablet Take 10 mg by mouth 3 (three) times daily.     [provider]  diclofenac sodium (VOLTAREN) 1 % GEL Apply 2 g topically 4 (four) times daily. Patient not taking: Reported on 10/06/2017 08/16/15   Norman Herrlich, MD  DULoxetine (CYMBALTA) 60 MG capsule Take 60 mg by mouth daily.     [provider]  gabapentin (NEURONTIN) 800 MG tablet TAKE 1 TABLET BY MOUTH IN THE MORNING, 1 TABLET BY MOUTH MIDDAY, AND 2 TABLETS BY MOUTH AT BEDTIME Patient not taking: Reported on 10/06/2017 08/16/15   Norman Herrlich, MD  hydrochlorothiazide (HYDRODIURIL) 25 MG tablet Take 1 tablet (25 mg total) by mouth daily. 10/06/17   Dorie Rank, MD  lisinopril (PRINIVIL,ZESTRIL) 20 MG tablet Take 1 tablet (20 mg total) by mouth 2 (two) times daily. 10/06/17   Dorie Rank, MD  Multiple Vitamins-Minerals (ONE-A-DAY 50 PLUS PO) Take 1 tablet by mouth daily.    [provider]  potassium chloride (K-DUR) 10 MEQ tablet TAKE 1 TABLET BY MOUTH EVERY DAY Patient not taking: Reported on 10/06/2017 08/29/14   Nahser, Wonda Cheng, MD  warfarin (COUMADIN) 7.5 MG tablet Take 3 tablets all days of week EXCEPT on Thursdays and Saturdays--take 2&1/2 tablets. 10/06/17   Dorie Rank, MD  ziprasidone (GEODON) 60 MG capsule Take 60 mg by mouth 2 (two) times daily with a meal.     [provider]    Family History Family History  Problem Relation Age of Onset  . Breast cancer Mother   . Breast cancer Other        GREAT AUNT  . Colon cancer Neg Hx   .  Rectal cancer Neg Hx   . Stomach cancer Neg Hx     Social History Social History  Substance Use Topics  . Smoking status: Current Every Day Smoker    Packs/day: 0.50    Years: 18.00    Types: Cigarettes  . Smokeless tobacco: Never Used     Comment: 1pk 1/2 PACK DAY 2-3 0r none  . Alcohol use 0.0 oz/week     Comment: rare     Allergies   Propoxyphene n-acetaminophen and Strawberry extract   Review of Systems Review of Systems  All other systems reviewed and are negative.    Physical Exam Updated Vital Signs BP (!) 162/87 (BP Location: Right Arm)   Pulse 76   Temp 97.8 F (36.6 C) (Oral)   Resp 20   SpO2 97%   Physical Exam  Constitutional: No distress.  HENT:  Head: Normocephalic and atraumatic.  Right Ear: External ear normal.  Left Ear: External ear normal.  Eyes: Conjunctivae are normal. Right eye exhibits no discharge. Left eye exhibits no discharge. No scleral icterus.  Neck: Neck supple. No tracheal deviation present.  Cardiovascular: Normal rate, regular rhythm and intact distal pulses.   Pulmonary/Chest: Effort normal and breath sounds normal. No stridor. No respiratory distress. She has no wheezes. She has no rales.  Abdominal: Soft. Bowel sounds are normal. She exhibits no distension. There is no tenderness. There is no rebound and no guarding.  Musculoskeletal: She exhibits no edema or tenderness.  Neurological: She is alert. She has normal strength. No cranial nerve deficit (no facial droop, extraocular movements intact, no slurred speech) or sensory deficit. She exhibits normal muscle tone. She displays no seizure activity. Coordination normal.  Skin: Skin is warm and dry. No rash noted. She is not diaphoretic.  Psychiatric: She is agitated. She exhibits a depressed mood. She expresses suicidal ideation.  Nursing note and vitals reviewed.    ED Treatments / Results  Labs (all labs ordered are listed, but only abnormal results are displayed) Labs  Reviewed  COMPREHENSIVE METABOLIC PANEL - Abnormal; Notable for the following:       Result Value   Creatinine, Ser 1.13 (*)    ALT 13 (*)    GFR calc non Af Amer 54 (*)    All other components within normal limits  CBC - Abnormal; Notable for the following:    Hemoglobin 15.9 (*)    HCT 46.8 (*)    All other components within normal limits  RAPID URINE DRUG SCREEN, HOSP PERFORMED - Abnormal; Notable for the following:    Cocaine POSITIVE (*)    All other components within normal limits  ETHANOL  PROTIME-INR     Procedures Procedures (including critical care time)  Medications Ordered in ED Medications  amLODipine (NORVASC) tablet 2.5 mg (not administered)  buPROPion (WELLBUTRIN XL) 24 hr tablet 300 mg (not administered)  carvedilol (COREG) tablet 25 mg (not administered)  diazepam (VALIUM) tablet 10 mg (10 mg Oral Given 10/06/17 2237)  hydrochlorothiazide (HYDRODIURIL) tablet 25 mg (not administered)  lisinopril (PRINIVIL,ZESTRIL) tablet 20 mg (20 mg Oral Given 10/06/17 2236)  ziprasidone (GEODON) capsule 60 mg (not administered)  warfarin (COUMADIN) tablet 7.5 mg (not administered)  Warfarin - Physician Dosing Inpatient (not administered)     Initial Impression / Assessment and Plan / ED Course  I have reviewed the triage vital signs and the nursing notes.  Pertinent labs & imaging results that were available during my care of the patient were reviewed by me and considered in my medical decision making (see chart for details).   Pt presented to the ED for medical clearance.  She is depressed and suicidal.  Pt has not been taking her blood pressure medications and has been using cocaine.  BP initially elevated at 211/104.  Repeat BP down to 185/84.  Will give pt her blood pressure medications.  Otherwise clear medically for psychiatric treatment  Refills of her BP med and coumadin provided  Final Clinical Impressions(s) / ED Diagnoses   Final diagnoses:  Suicidal  ideation  Hypertension, unspecified  type  Cocaine abuse [facility])    New Prescriptions Refilled her bp and coumadin      Dorie Rank, MD 10/06/17 2304

## 2017-10-06 NOTE — ED Notes (Signed)
Pt ambulatory to room.

## 2017-10-08 DIAGNOSIS — M797 Fibromyalgia: Secondary | ICD-10-CM | POA: Diagnosis present

## 2017-10-08 DIAGNOSIS — Z9114 Patient's other noncompliance with medication regimen: Secondary | ICD-10-CM | POA: Diagnosis not present

## 2017-10-08 DIAGNOSIS — F17210 Nicotine dependence, cigarettes, uncomplicated: Secondary | ICD-10-CM | POA: Diagnosis present

## 2017-10-08 DIAGNOSIS — F315 Bipolar disorder, current episode depressed, severe, with psychotic features: Secondary | ICD-10-CM | POA: Diagnosis not present

## 2017-10-08 DIAGNOSIS — M1990 Unspecified osteoarthritis, unspecified site: Secondary | ICD-10-CM | POA: Diagnosis present

## 2017-10-08 DIAGNOSIS — Z7901 Long term (current) use of anticoagulants: Secondary | ICD-10-CM | POA: Diagnosis not present

## 2017-10-08 DIAGNOSIS — I10 Essential (primary) hypertension: Secondary | ICD-10-CM | POA: Diagnosis present

## 2017-10-16 ENCOUNTER — Encounter: Payer: Self-pay | Admitting: Pharmacist

## 2017-10-16 DIAGNOSIS — I8290 Acute embolism and thrombosis of unspecified vein: Secondary | ICD-10-CM

## 2017-10-16 LAB — POCT INR: INR: 6.9

## 2017-10-16 NOTE — Patient Instructions (Signed)
Patient educated about medication as defined in this encounter and verbalized understanding by repeating back instructions provided.   

## 2017-10-16 NOTE — Progress Notes (Signed)
Anticoagulation Management Alisha [name] is a 56 y.o. female who is on warfarin. Patient has not been seen in [facility] for about 1 year. Patient was seen in the emergency department 10/8 for suicidal ideation, then referred to [facility] where she is currently receiving care. Pharmacy director at [facility], completed patient assessment and contacted me with findings. Patient has a history of recurrent VTE.  Anticoagulation Episode Summary    Current INR goal:   2.0-3.0  TTR:   19.4 % (2.5 y)  Next INR check:   10/16/2017  INR from last check:   6.9! (10/16/2017)  Weekly max warfarin dose:     Target end date:     [facility] check [facility]   [facility]  Preferred lab:     Send INR reminders to:      Indications   Venous thromboembolism [I82.90]       Comments:          Allergies  Allergen Reactions  . Propoxyphene N-Acetaminophen Swelling  . Strawberry Extract     Electric shock    Medication Sig  albuterol (PROVENTIL HFA;VENTOLIN HFA) 108 (90 Base) MCG/ACT inhaler Inhale 1-2 puffs into the lungs every 4 (four) hours as needed for wheezing or shortness of breath.  amLODipine (NORVASC) 2.5 MG tablet Take 1 tablet (2.5 mg total) by mouth daily.  aspirin EC 81 MG tablet Take 81 mg by mouth daily.  atorvastatin (LIPITOR) 40 MG tablet Take 1 tablet (40 mg total) by mouth at bedtime. Patient not taking: Reported on 10/06/2017  buPROPion (WELLBUTRIN XL) 300 MG 24 hr tablet Take 300 mg by mouth daily.  busPIRone (BUSPAR) 10 MG tablet Take 10 mg by mouth 3 (three) times daily.  carvedilol (COREG) 25 MG tablet Take 1 tablet (25 mg total) by mouth 2 (two) times daily with a meal.  diazepam (VALIUM) 5 MG tablet Take 10 mg by mouth 3 (three) times daily.   diclofenac sodium (VOLTAREN) 1 % GEL Apply 2 g topically 4 (four) times daily. Patient not taking: Reported on 10/06/2017  DULoxetine (CYMBALTA) 60 MG capsule Take 60 mg by mouth daily.   gabapentin (NEURONTIN) 800  MG tablet TAKE 1 TABLET BY MOUTH IN THE MORNING, 1 TABLET BY MOUTH MIDDAY, AND 2 TABLETS BY MOUTH AT BEDTIME Patient not taking: Reported on 10/06/2017  hydrochlorothiazide (HYDRODIURIL) 25 MG tablet Take 1 tablet (25 mg total) by mouth daily.  lisinopril (PRINIVIL,ZESTRIL) 20 MG tablet Take 1 tablet (20 mg total) by mouth 2 (two) times daily.  Multiple Vitamins-Minerals (ONE-A-DAY 50 PLUS PO) Take 1 tablet by mouth daily.  potassium chloride (K-DUR) 10 MEQ tablet TAKE 1 TABLET BY MOUTH EVERY DAY Patient not taking: Reported on 10/06/2017  warfarin (COUMADIN) 7.5 MG tablet Take 3 tablets all days of week EXCEPT on Thursdays and Saturdays--take 2&1/2 tablets.  ziprasidone (GEODON) 60 MG capsule Take 60 mg by mouth 2 (two) times daily with a meal.    Past Medical History:  Diagnosis Date  . Anxiety   . Asthma   . Breast lump 02/2008   Biopsy 05/2008: showed no evidence of malignancy  . Breast mass in female    bi lat   . Chronic pain syndrome   . Degenerative joint disease of knee   . Depression   . 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 Endometrial mass 10/12/2012   Endometrium, biopsy on 11/04/12 - PROLIFERATIVE ENDOMETRIUM AND ABUNDANT  MUCUS. NO HYPERPLASIA OR CARCINOMA.   . Hyperlipidemia   . Hypertension   . Irregular menses 9/08  . Joint pain   . Mixed stress and urge urinary incontinence    [facility], underwent cystoscopy & uroflowmetry   . Pulmonary edema   . PVD (peripheral vascular disease) ([place])    s/p L fem-pop bypass 11/21/08; graft occluded 12/28/08;  aortogram w/ bilat LE runoff: 1.  bilat diffuse SFA occlusive dz, 2.  Mod to severe above-knee popliteal dz,  3.  Bilat 3-vessel runoff w/ mild tibial occlusive dz.  . Restless leg syndrome   . Sigmoid diverticulitis 10/26/2012  . Tobacco abuse    Social History   Social History  . Marital status: Divorced    Spouse name: N/A  . Number of  children: 4  . Years of education: N/A   Occupational History  . disable Unemployed   Social History Main Topics  . Smoking status: Current Every Day Smoker    Packs/day: 0.50    Years: 18.00    Types: Cigarettes  . Smokeless tobacco: Never Used     Comment: 1pk 1/2 PACK DAY 2-3 0r none  . Alcohol use 0.0 oz/week     Comment: rare  . Drug use: No     Comment: hx of marijuana and cocaine use  . Sexual activity: No   Other Topics Concern  . Not on file   Social History Narrative   Financial assistance application initiated. Patient needs to submit further paperwork to complete   [facility]  September 11, 2010 2:04 PM   Financial assistance approved for 100% discount at [facility] and has [facility] card   [name]  October 04, 2010 5:29 PM   Family History  Problem Relation Age of Onset  . Breast cancer Mother   . Breast cancer Other        GREAT AUNT  . Colon cancer Neg Hx   . Rectal cancer Neg Hx   . Stomach cancer Neg Hx    ASSESSMENT Recent Results: Upon admission to [place], patient reported taking 22.5 mg daily. Her INR at the facility was 1.1 on 10/10/17, but due to apprehension about the dosing, they administered 20 mg daily. Lab Results  Component Value Date   INR 6.9 10/16/2017   INR 0.98 10/06/2017   INR 2.10 10/07/2016   Anticoagulation Dosing: INR as of 10/16/2017 and Previous Warfarin Dosing Information    INR Dt INR Goal Madilyn Fireman Sun Mon Tue Wed Thu Fri Sat   10/16/2017 6.9 2.0-3.0 120 mg 20 mg 20 mg 20 mg 20 mg 0 mg 20 mg 20 mg   Patient deviated from recommended dosing.       Anticoagulation Warfarin Dose Instructions as of 10/16/2017      Total Sun Mon Tue Wed Thu Fri Sat   New Dose 70 mg 10 mg 10 mg 10 mg 10 mg 10 mg 10 mg 10 mg     (5 mg x 2)  (5 mg x 2)  (5 mg x 2)  (5 mg x 2)  (5 mg x 2)  (5 mg x 2)  (5 mg x 2)                           INR today: Supratherapeutic  PLAN Discussed with pharmacy director at [place]: hold at least 2 doses of warfarin, give  2.5 mg to 5 mg vitamin k if patient has bleeding or bruising symptoms, then re-start warfarin at 50% reduction which would be 10 mg daily.  Patient Instructions  Patient educated about medication as defined in this encounter and verbalized understanding by repeating back instructions provided.   Patient advised to contact clinic or seek medical attention if signs/symptoms of bleeding or thromboembolism occur.  Patient verbalized understanding by repeating back information and was advised to contact me if further medication-related questions arise. Patient was also provided an information handout.  Follow-up Patient would need to re-establish care with [facility]  Flossie Dibble

## 2017-11-03 ENCOUNTER — Encounter (HOSPITAL_COMMUNITY): Payer: Self-pay

## 2017-11-03 ENCOUNTER — Other Ambulatory Visit: Payer: Self-pay

## 2017-11-03 ENCOUNTER — Emergency Department (HOSPITAL_COMMUNITY)
Admission: EM | Admit: 2017-11-03 | Discharge: 2017-11-04 | Disposition: A | Discharge status: Home / Self Care | Payer: Medicare Other | Attending: Emergency Medicine | Admitting: Emergency Medicine

## 2017-11-03 DIAGNOSIS — I10 Essential (primary) hypertension: Secondary | ICD-10-CM | POA: Insufficient documentation

## 2017-11-03 DIAGNOSIS — Z7901 Long term (current) use of anticoagulants: Secondary | ICD-10-CM | POA: Insufficient documentation

## 2017-11-03 DIAGNOSIS — R109 Unspecified abdominal pain: Secondary | ICD-10-CM | POA: Diagnosis not present

## 2017-11-03 DIAGNOSIS — Z79899 Other long term (current) drug therapy: Secondary | ICD-10-CM | POA: Insufficient documentation

## 2017-11-03 DIAGNOSIS — F17210 Nicotine dependence, cigarettes, uncomplicated: Secondary | ICD-10-CM | POA: Diagnosis not present

## 2017-11-03 DIAGNOSIS — Z7982 Long term (current) use of aspirin: Secondary | ICD-10-CM | POA: Insufficient documentation

## 2017-11-03 DIAGNOSIS — R197 Diarrhea, unspecified: Secondary | ICD-10-CM | POA: Insufficient documentation

## 2017-11-03 DIAGNOSIS — J45909 Unspecified asthma, uncomplicated: Secondary | ICD-10-CM | POA: Insufficient documentation

## 2017-11-03 DIAGNOSIS — Z202 Contact with and (suspected) exposure to infections with a predominantly sexual mode of transmission: Secondary | ICD-10-CM | POA: Diagnosis not present

## 2017-11-03 DIAGNOSIS — F1410 Cocaine abuse, uncomplicated: Secondary | ICD-10-CM | POA: Diagnosis not present

## 2017-11-03 LAB — COMPREHENSIVE METABOLIC PANEL
ALT: 15 U/L (ref 14–54)
AST: 18 U/L (ref 15–41)
Albumin: 3.9 g/dL (ref 3.5–5.0)
Alkaline Phosphatase: 68 U/L (ref 38–126)
Anion gap: 10 (ref 5–15)
BUN: 10 mg/dL (ref 6–20)
CHLORIDE: 110 mmol/L (ref 101–111)
CO2: 20 mmol/L — AB (ref 22–32)
CREATININE: 0.86 mg/dL (ref 0.44–1.00)
Calcium: 9 mg/dL (ref 8.9–10.3)
Glucose, Bld: 97 mg/dL (ref 65–99)
POTASSIUM: 3.7 mmol/L (ref 3.5–5.1)
SODIUM: 140 mmol/L (ref 135–145)
Total Bilirubin: 0.6 mg/dL (ref 0.3–1.2)
Total Protein: 7.2 g/dL (ref 6.5–8.1)

## 2017-11-03 LAB — URINALYSIS, ROUTINE W REFLEX MICROSCOPIC
Bilirubin Urine: NEGATIVE
GLUCOSE, UA: NEGATIVE mg/dL
Hgb urine dipstick: NEGATIVE
Ketones, ur: 5 mg/dL — AB
LEUKOCYTES UA: NEGATIVE
Nitrite: NEGATIVE
PH: 5 (ref 5.0–8.0)
PROTEIN: 30 mg/dL — AB
SPECIFIC GRAVITY, URINE: 1.032 — AB (ref 1.005–1.030)

## 2017-11-03 LAB — RAPID URINE DRUG SCREEN, HOSP PERFORMED
AMPHETAMINES: NOT DETECTED
BENZODIAZEPINES: POSITIVE — AB
Barbiturates: NOT DETECTED
COCAINE: POSITIVE — AB
OPIATES: NOT DETECTED
Tetrahydrocannabinol: NOT DETECTED

## 2017-11-03 LAB — CBC
HEMATOCRIT: 43.6 % (ref 36.0–46.0)
Hemoglobin: 14.7 g/dL (ref 12.0–15.0)
MCH: 32.8 pg (ref 26.0–34.0)
MCHC: 33.7 g/dL (ref 30.0–36.0)
MCV: 97.3 fL (ref 78.0–100.0)
Platelets: 217 10*3/uL (ref 150–400)
RBC: 4.48 MIL/uL (ref 3.87–5.11)
RDW: 13.3 % (ref 11.5–15.5)
WBC: 8 10*3/uL (ref 4.0–10.5)

## 2017-11-03 LAB — LIPASE, BLOOD: LIPASE: 83 U/L — AB (ref 11–51)

## 2017-11-03 NOTE — ED Notes (Signed)
Called for room assignment, X 2, no response

## 2017-11-03 NOTE — ED Triage Notes (Signed)
Pt states she has been having extremely bad abdominal pain x 2 weeks and her boyfriends of 11 years suddenly left home and has'nt come back. PT states because of this behavior she feels "he gave me something bad and now i'm dying and I cant go back to my daughters house like this because he gave me something and she has small kids. The pain is so bad its making me strain to talk and now i'm losing my voice. I need to speak to a social worker to get me somewhere to live cause I cant go back around those kids."  Pt is crying and difficult to follow in triage.

## 2017-11-04 ENCOUNTER — Emergency Department (HOSPITAL_COMMUNITY)
Admission: EM | Admit: 2017-11-04 | Discharge: 2017-11-04 | Disposition: A | Discharge status: Home / Self Care | Payer: Medicare Other | Attending: Emergency Medicine | Admitting: Emergency Medicine

## 2017-11-04 ENCOUNTER — Emergency Department (HOSPITAL_COMMUNITY): Payer: Medicare Other

## 2017-11-04 ENCOUNTER — Encounter (HOSPITAL_COMMUNITY): Payer: Self-pay | Admitting: Emergency Medicine

## 2017-11-04 DIAGNOSIS — I10 Essential (primary) hypertension: Secondary | ICD-10-CM | POA: Insufficient documentation

## 2017-11-04 DIAGNOSIS — Z7982 Long term (current) use of aspirin: Secondary | ICD-10-CM | POA: Diagnosis not present

## 2017-11-04 DIAGNOSIS — Z202 Contact with and (suspected) exposure to infections with a predominantly sexual mode of transmission: Secondary | ICD-10-CM

## 2017-11-04 DIAGNOSIS — R109 Unspecified abdominal pain: Secondary | ICD-10-CM | POA: Diagnosis not present

## 2017-11-04 DIAGNOSIS — Z79899 Other long term (current) drug therapy: Secondary | ICD-10-CM | POA: Insufficient documentation

## 2017-11-04 DIAGNOSIS — J45909 Unspecified asthma, uncomplicated: Secondary | ICD-10-CM | POA: Diagnosis not present

## 2017-11-04 DIAGNOSIS — F17210 Nicotine dependence, cigarettes, uncomplicated: Secondary | ICD-10-CM | POA: Diagnosis not present

## 2017-11-04 DIAGNOSIS — Z7901 Long term (current) use of anticoagulants: Secondary | ICD-10-CM | POA: Diagnosis not present

## 2017-11-04 LAB — I-STAT TROPONIN, ED: TROPONIN I, POC: 0 ng/mL (ref 0.00–0.08)

## 2017-11-04 LAB — PROTIME-INR
INR: 1.05
Prothrombin Time: 13.7 seconds (ref 11.4–15.2)

## 2017-11-04 LAB — I-STAT CG4 LACTIC ACID, ED: LACTIC ACID, VENOUS: 0.87 mmol/L (ref 0.5–1.9)

## 2017-11-04 MED ORDER — OMEPRAZOLE 20 MG PO CPDR: 20.0000 mg | DELAYED_RELEASE_CAPSULE | Freq: Every day | ORAL | 0 refills | Status: DC

## 2017-11-04 MED ORDER — DICYCLOMINE HCL 10 MG PO CAPS
20.0000 mg | ORAL_CAPSULE | Freq: Once | ORAL | Status: AC
  Administered 2017-11-04: 20 mg via ORAL
  Filled 2017-11-04: qty 2

## 2017-11-04 MED ORDER — DICYCLOMINE HCL 20 MG PO TABS: 20.0000 mg | ORAL_TABLET | Freq: Two times a day (BID) | ORAL | 0 refills | Status: DC | PRN

## 2017-11-04 NOTE — ED Provider Notes (Signed)
[place] EMERGENCY DEPARTMENT Provider Note   CSN: [id number] Arrival date & time: 11/03/17  1651     History   Chief Complaint Chief Complaint  Patient presents with  . Abdominal Pain    HPI Alisha [name] is a 56 y.o. female.  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  She reports abdominal cramping over the past 2 weeks which has been associated with intermittent watery diarrhea.  She states that the pain will travel into her chest and she believes her boyfriend gave her something to cause her symptoms.  She admits to using cocaine, but does not believe that this drug use is related to her symptoms.  She denies any known fevers.  She has not had any vomiting no melena, hematochezia. SHx of tubal ligation.      Past Medical History:  Diagnosis Date  . Anxiety   . Asthma   . Breast lump 02/2008   Biopsy 05/2008: showed no evidence of malignancy  . Breast mass in female    bi lat   . Chronic pain syndrome   . Degenerative joint disease of knee   . Depression   . 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 Endometrial mass 10/12/2012   Endometrium, biopsy on 11/04/12 - PROLIFERATIVE ENDOMETRIUM AND ABUNDANT MUCUS. NO HYPERPLASIA OR CARCINOMA.   . Hyperlipidemia   . Hypertension   . Irregular menses 9/08  . Joint pain   . Mixed stress and urge urinary incontinence    [facility], underwent cystoscopy & uroflowmetry   . Pulmonary edema   . PVD (peripheral vascular disease) ([place])    s/p L fem-pop bypass 11/21/08; graft occluded 12/28/08;  aortogram w/ bilat LE runoff: 1.  bilat diffuse SFA occlusive dz, 2.  Mod to severe above-knee popliteal dz,  3.  Bilat 3-vessel runoff w/ mild tibial  occlusive dz.  . Restless leg syndrome   . Sigmoid diverticulitis 10/26/2012  . Tobacco abuse     Patient Active Problem List   Diagnosis Date Noted  . Asthma 10/07/2016  . Colonic diverticular abscess   . Anxiety 08/28/2014  . Breast pain, left 08/11/2014  . Abnormal ECG 01/28/2014  . Health care maintenance 09/16/2013  . Metrorrhagia 10/12/2012  . Endometrial mass 10/12/2012  . 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 Osteoarthritis of both knees 02/15/2012  . Chronic pain syndrome 07/05/2010  . Venous thromboembolism 02/22/2010  . Depression 07/17/2009  . 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 TOBACCO ABUSE 10/20/2006  . Essential hypertension 10/20/2006  . Peripheral vascular disease ([place]) 10/20/2006  . HYPERLIPIDEMIA, MIXED 10/10/2006    Past Surgical History:  Procedure Laterality Date  .  Right external iliac artery stent    . CARDIAC CATHETERIZATION  2/15  . CAROTID ENDARTERECTOMY    . FEMORAL BYPASS  11/09  . PR VEIN BYPASS GRAFT,AORTO-FEM-POP    . Right breast needle-localized lumpectomy    . TONSILLECTOMY    . TUBAL LIGATION      OB History    Gravida Para Term Preterm AB Living   6 4 4   2 4    SAB TAB Ectopic Multiple Live Births   1 1             Home Medications  Prior to Admission medications   Medication Sig Start Date End Date Taking? Authorizing Provider  albuterol (PROVENTIL HFA;VENTOLIN HFA) 108 (90 Base) MCG/ACT inhaler Inhale 1-2 puffs into the lungs every 4 (four) hours as needed for wheezing or shortness of breath. 10/07/16   Shela Leff, MD  amLODipine (NORVASC) 2.5 MG tablet Take 1 tablet (2.5 mg total) by mouth daily. 10/06/17   Dorie Rank, MD  aspirin EC 81 MG tablet Take 81 mg by mouth daily.    [provider]  atorvastatin (LIPITOR) 40 MG tablet Take 1 tablet (40 mg total) by mouth at bedtime. Patient not taking: Reported on 10/06/2017 11/05/13   Bethena Roys, MD  buPROPion (WELLBUTRIN XL)  300 MG 24 hr tablet Take 300 mg by mouth daily. 12/11/14   [provider]  busPIRone (BUSPAR) 10 MG tablet Take 10 mg by mouth 3 (three) times daily.    [provider]  carvedilol (COREG) 25 MG tablet Take 1 tablet (25 mg total) by mouth 2 (two) times daily with a meal. 10/06/17   Dorie Rank, MD  diazepam (VALIUM) 5 MG tablet Take 10 mg by mouth 3 (three) times daily.     [provider]  diclofenac sodium (VOLTAREN) 1 % GEL Apply 2 g topically 4 (four) times daily. Patient not taking: Reported on 10/06/2017 08/16/15   Norman Herrlich, MD  dicyclomine (BENTYL) 20 MG tablet Take 1 tablet (20 mg total) every 12 (twelve) hours as needed by mouth (abdominal pain/cramping). 11/04/17   Antonietta Breach, PA-C  DULoxetine (CYMBALTA) 60 MG capsule Take 60 mg by mouth daily.     [provider]  gabapentin (NEURONTIN) 800 MG tablet TAKE 1 TABLET BY MOUTH IN THE MORNING, 1 TABLET BY MOUTH MIDDAY, AND 2 TABLETS BY MOUTH AT BEDTIME Patient not taking: Reported on 10/06/2017 08/16/15   Norman Herrlich, MD  hydrochlorothiazide (HYDRODIURIL) 25 MG tablet Take 1 tablet (25 mg total) by mouth daily. 10/06/17   Dorie Rank, MD  lisinopril (PRINIVIL,ZESTRIL) 20 MG tablet Take 1 tablet (20 mg total) by mouth 2 (two) times daily. 10/06/17   Dorie Rank, MD  Multiple Vitamins-Minerals (ONE-A-DAY 50 PLUS PO) Take 1 tablet by mouth daily.    [provider]  omeprazole (PRILOSEC) 20 MG capsule Take 1 capsule (20 mg total) daily by mouth. 11/04/17   Antonietta Breach, PA-C  potassium chloride (K-DUR) 10 MEQ tablet TAKE 1 TABLET BY MOUTH EVERY DAY Patient not taking: Reported on 10/06/2017 08/29/14   Nahser, Wonda Cheng, MD  warfarin (COUMADIN) 7.5 MG tablet Take 3 tablets all days of week EXCEPT on Thursdays and Saturdays--take 2&1/2 tablets. 10/06/17   Dorie Rank, MD  ziprasidone (GEODON) 60 MG capsule Take 60 mg by mouth 2 (two) times daily with a meal.     [provider]    Family  History Family History  Problem Relation Age of Onset  . Breast cancer Mother   . Breast cancer Other        GREAT AUNT  . Colon cancer Neg Hx   . Rectal cancer Neg Hx   . Stomach cancer Neg Hx     Social History Social History   Tobacco Use  . Smoking status: Current Every Day Smoker    Packs/day: 0.50    Years: 18.00    Pack years: 9.00    Types: Cigarettes  . Smokeless tobacco: Never Used  . Tobacco comment: 1pk 1/2 PACK DAY 2-3 0r none  Substance  Use Topics  . Alcohol use: Yes    Alcohol/week: 0.0 oz    Comment: rare  . Drug use: No    Comment: hx of marijuana and cocaine use     Allergies   Propoxyphene n-acetaminophen and Strawberry extract   Review of Systems Review of Systems Ten systems reviewed and are negative for acute change, except as noted in the HPI.    Physical Exam Updated Vital Signs BP 131/88   Pulse 80   Temp 98.1 F (36.7 C) (Oral)   Resp 19   LMP 11/22/2015   SpO2 97%   Physical Exam  Constitutional: She is oriented to person, place, and time. She appears well-developed and well-nourished. No distress.  Nontoxic appearing and in no acute distress  HENT:  Head: Normocephalic and atraumatic.  Eyes: Conjunctivae and EOM are normal. No scleral icterus.  Neck: Normal range of motion.  Cardiovascular: Normal rate, regular rhythm and intact distal pulses.  Pulmonary/Chest: Effort normal. No stridor. No respiratory distress. She has no wheezes.  Respirations even and unlabored  Abdominal: Soft. She exhibits no distension and no mass. There is no guarding.  Soft, obese abdomen with no focal tenderness.  No peritoneal signs or palpable masses.  Musculoskeletal: Normal range of motion.  Neurological: She is alert and oriented to person, place, and time. She exhibits normal muscle tone. Coordination normal.  GCS 15 for age.  Patient moving all extremities.  Skin: Skin is warm and dry. No rash noted. She is not diaphoretic. No erythema. No  pallor.  Psychiatric:  Anxious with tangential speech  Nursing note and vitals reviewed.    ED Treatments / Results  Labs (all labs ordered are listed, but only abnormal results are displayed) Labs Reviewed  LIPASE, BLOOD - Abnormal; Notable for the following components:      Result Value   Lipase 83 (*)    All other components within normal limits  COMPREHENSIVE METABOLIC PANEL - Abnormal; Notable for the following components:   CO2 20 (*)    All other components within normal limits  URINALYSIS, ROUTINE W REFLEX MICROSCOPIC - Abnormal; Notable for the following components:   APPearance CLOUDY (*)    Specific Gravity, Urine 1.032 (*)    Ketones, ur 5 (*)    Protein, ur 30 (*)    Bacteria, UA RARE (*)    Squamous Epithelial / LPF 6-30 (*)    All other components within normal limits  RAPID URINE DRUG SCREEN, HOSP PERFORMED - Abnormal; Notable for the following components:   Cocaine POSITIVE (*)    Benzodiazepines POSITIVE (*)    All other components within normal limits  CBC  PROTIME-INR  I-STAT CG4 LACTIC ACID, ED  I-STAT TROPONIN, ED    EKG  EKG Interpretation None       Radiology Dg Abd Acute W/chest  Result Date: 11/04/2017 CLINICAL DATA:  Severe abdominal pain for 2 weeks, chest pain. History of hypertension, tobacco abuse, diverticulitis. EXAM: DG ABDOMEN ACUTE W/ 1V CHEST COMPARISON:  CT abdomen and pelvis March 28, 2015 FINDINGS: Cardiomediastinal silhouette is normal. Calcified aortic knob. Mild bronchitic changes. Lungs are clear, no pleural effusions. No pneumothorax. Soft tissue planes and included osseous structures are unremarkable. Bowel gas pattern is nondilated and nonobstructive. Moderate amount of retained large bowel stool. Calcified leiomyoma RIGHT pelvis. Aortoiliac calcific atherosclerosis. No intra-abdominal mass effect, pathologic calcifications or free air. Soft tissue planes and included osseous structures are non-suspicious. IMPRESSION: Mild  bronchitic changes.  No focal  consolidation. Normal bowel gas pattern. Moderate volume retained large bowel stool. Electronically Signed   By: Elon Alas M.D.   On: 11/04/2017 01:38    Procedures Procedures (including critical care time)  Medications Ordered in ED Medications  dicyclomine (BENTYL) capsule 20 mg (20 mg Oral Given 11/04/17 0114)     Initial Impression / Assessment and Plan / ED Course  I have reviewed the triage vital signs and the nursing notes.  Pertinent labs & imaging results that were available during my care of the patient were reviewed by me and considered in my medical decision making (see chart for details).     56 year old female presents to the emergency department for 2 weeks of abdominal cramping with intermittent watery diarrhea.  She expresses concern that her boyfriend may have given her something to cause her symptoms.  She has not had any vomiting.  No fevers.  Abdomen soft without focal tenderness.  No masses or peritoneal signs.  Patient has had a very complete and reassuring laboratory workup today.  She is afebrile with no leukocytosis.  Given chronicity of symptoms, infectious etiology is unlikely.  Liver and kidney function preserved.  No electrolyte derangements.  Patient also without evidence of urinary tract infection.  Lactate normal making mesenteric ischemia unlikely.  Symptoms atypical for pSBO and SBO.  This is also not supported given normal bowel movements and lack of vomiting.  X-ray with nonobstructive bowel gas pattern.  Patient has no focal tenderness at McBurney's point.  Doubt appendicitis.  Further doubt gallbladder etiology.  Patient given Bentyl in the emergency department for management.  Will withhold narcotics given history of illicit drug use.  I have encouraged the patient to follow-up further with her primary care doctor.  Prescriptions given for Prilosec and Bentyl. Return precautions provided. Patient discharged in stable  condition with no unaddressed concerns.   Final Clinical Impressions(s) / ED Diagnoses   Final diagnoses:  Abdominal pain, unspecified abdominal location  Cocaine abuse [facility])    ED Discharge Orders        Ordered    dicyclomine (BENTYL) 20 MG tablet  Every 12 hours PRN     11/04/17 0351    omeprazole (PRILOSEC) 20 MG capsule  Daily     11/04/17 0351       Antonietta Breach, PA-C 11/04/17 6045    Ward, Delice Bison, DO 11/04/17 [phone]

## 2017-11-04 NOTE — ED Provider Notes (Signed)
[place] EMERGENCY DEPARTMENT Provider Note   CSN: [id number] Arrival date & time: 11/04/17  0845     History   Chief Complaint Chief Complaint  Patient presents with  . SEXUALLY TRANSMITTED DISEASE    HPI Alisha [name] is a 56 y.o. female.  HPI   57 year old female presents today with quest for testing for HIV.  Patient is tearful throughout exam reporting that she has been in mental health facility for the last several weeks and return home was unable to make contact with a sexual partner and is concerned she may have an STD.  She denies any physical complaints, but is concerned about HIV.  Patient requesting treatment today.  Past Medical History:  Diagnosis Date  . Anxiety   . Asthma   . Breast lump 02/2008   Biopsy 05/2008: showed no evidence of malignancy  . Breast mass in female    bi lat   . Chronic pain syndrome   . Degenerative joint disease of knee   . Depression   . 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 Endometrial mass 10/12/2012   Endometrium, biopsy on 11/04/12 - PROLIFERATIVE ENDOMETRIUM AND ABUNDANT MUCUS. NO HYPERPLASIA OR CARCINOMA.   . Hyperlipidemia   . Hypertension   . Irregular menses 9/08  . Joint pain   . Mixed stress and urge urinary incontinence    [facility], underwent cystoscopy & uroflowmetry   . Pulmonary edema   . PVD (peripheral vascular disease) ([place])    s/p L fem-pop bypass 11/21/08; graft occluded 12/28/08;  aortogram w/ bilat LE runoff: 1.  bilat diffuse SFA occlusive dz, 2.  Mod to severe above-knee popliteal dz,  3.  Bilat 3-vessel runoff w/ mild tibial occlusive dz.  . Restless leg syndrome   . Sigmoid diverticulitis 10/26/2012  . Tobacco abuse     Patient Active Problem List   Diagnosis Date Noted  . Asthma 10/07/2016  . Colonic diverticular abscess   . Anxiety 08/28/2014  . Breast pain, left 08/11/2014  . Abnormal ECG  01/28/2014  . Health care maintenance 09/16/2013  . Metrorrhagia 10/12/2012  . Endometrial mass 10/12/2012  . 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 Osteoarthritis of both knees 02/15/2012  . Chronic pain syndrome 07/05/2010  . Venous thromboembolism 02/22/2010  . Depression 07/17/2009  . 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 TOBACCO ABUSE 10/20/2006  . Essential hypertension 10/20/2006  . Peripheral vascular disease ([place]) 10/20/2006  . HYPERLIPIDEMIA, MIXED 10/10/2006    Past Surgical History:  Procedure Laterality Date  .  Right external iliac artery stent    . CARDIAC CATHETERIZATION  2/15  . CAROTID ENDARTERECTOMY    . FEMORAL BYPASS  11/09  . PR VEIN BYPASS GRAFT,AORTO-FEM-POP    . Right breast needle-localized lumpectomy    . TONSILLECTOMY    . TUBAL LIGATION      OB History    Gravida Para Term Preterm AB Living   6 4 4   2 4    SAB TAB Ectopic Multiple Live Births   1 1             Home Medications    Prior to Admission medications   Medication Sig Start Date End Date Taking? Authorizing Provider  albuterol (PROVENTIL HFA;VENTOLIN HFA) 108 (90 Base) MCG/ACT inhaler Inhale 1-2 puffs into the lungs every 4 (four) hours as needed for wheezing or shortness of breath.  10/07/16   Shela Leff, MD  amLODipine (NORVASC) 2.5 MG tablet Take 1 tablet (2.5 mg total) by mouth daily. 10/06/17   Dorie Rank, MD  aspirin EC 81 MG tablet Take 81 mg by mouth daily.    [provider]  atorvastatin (LIPITOR) 40 MG tablet Take 1 tablet (40 mg total) by mouth at bedtime. Patient not taking: Reported on 10/06/2017 11/05/13   Bethena Roys, MD  buPROPion (WELLBUTRIN XL) 300 MG 24 hr tablet Take 300 mg by mouth daily. 12/11/14   [provider]  busPIRone (BUSPAR) 10 MG tablet Take 10 mg by mouth 3 (three) times daily.    [provider]  carvedilol (COREG) 25 MG tablet Take 1 tablet (25 mg total) by mouth 2 (two) times daily with a  meal. 10/06/17   Dorie Rank, MD  diazepam (VALIUM) 5 MG tablet Take 10 mg by mouth 3 (three) times daily.     [provider]  diclofenac sodium (VOLTAREN) 1 % GEL Apply 2 g topically 4 (four) times daily. Patient not taking: Reported on 10/06/2017 08/16/15   Norman Herrlich, MD  dicyclomine (BENTYL) 20 MG tablet Take 1 tablet (20 mg total) every 12 (twelve) hours as needed by mouth (abdominal pain/cramping). 11/04/17   Antonietta Breach, PA-C  DULoxetine (CYMBALTA) 60 MG capsule Take 60 mg by mouth daily.     [provider]  gabapentin (NEURONTIN) 800 MG tablet TAKE 1 TABLET BY MOUTH IN THE MORNING, 1 TABLET BY MOUTH MIDDAY, AND 2 TABLETS BY MOUTH AT BEDTIME Patient not taking: Reported on 10/06/2017 08/16/15   Norman Herrlich, MD  hydrochlorothiazide (HYDRODIURIL) 25 MG tablet Take 1 tablet (25 mg total) by mouth daily. 10/06/17   Dorie Rank, MD  lisinopril (PRINIVIL,ZESTRIL) 20 MG tablet Take 1 tablet (20 mg total) by mouth 2 (two) times daily. 10/06/17   Dorie Rank, MD  Multiple Vitamins-Minerals (ONE-A-DAY 50 PLUS PO) Take 1 tablet by mouth daily.    [provider]  omeprazole (PRILOSEC) 20 MG capsule Take 1 capsule (20 mg total) daily by mouth. 11/04/17   Antonietta Breach, PA-C  potassium chloride (K-DUR) 10 MEQ tablet TAKE 1 TABLET BY MOUTH EVERY DAY Patient not taking: Reported on 10/06/2017 08/29/14   Nahser, Wonda Cheng, MD  warfarin (COUMADIN) 7.5 MG tablet Take 3 tablets all days of week EXCEPT on Thursdays and Saturdays--take 2&1/2 tablets. 10/06/17   Dorie Rank, MD  ziprasidone (GEODON) 60 MG capsule Take 60 mg by mouth 2 (two) times daily with a meal.     [provider]    Family History Family History  Problem Relation Age of Onset  . Breast cancer Mother   . Breast cancer Other        GREAT AUNT  . Colon cancer Neg Hx   . Rectal cancer Neg Hx   . Stomach cancer Neg Hx     Social History Social History   Tobacco Use  . Smoking status: Current Every Day  Smoker    Packs/day: 0.50    Years: 18.00    Pack years: 9.00    Types: Cigarettes  . Smokeless tobacco: Never Used  . Tobacco comment: 1pk 1/2 PACK DAY 2-3 0r none  Substance Use Topics  . Alcohol use: Yes    Alcohol/week: 0.0 oz    Comment: rare  . Drug use: No    Comment: hx of marijuana and cocaine use     Allergies   Propoxyphene n-acetaminophen and  Strawberry extract   Review of Systems Review of Systems  All other systems reviewed and are negative.    Physical Exam Updated Vital Signs BP 131/88 (BP Location: Right Arm)   Pulse 80   Temp 98.1 F (36.7 C)   Resp 16   LMP 11/22/2015   SpO2 100%   Physical Exam  Constitutional: She is oriented to person, place, and time. She appears well-developed and well-nourished.  HENT:  Head: Normocephalic and atraumatic.  Eyes: Conjunctivae are normal. Pupils are equal, round, and reactive to light. Right eye exhibits no discharge. Left eye exhibits no discharge. No scleral icterus.  Neck: Normal range of motion. No JVD present. No tracheal deviation present.  Pulmonary/Chest: Effort normal. No stridor.  Neurological: She is alert and oriented to person, place, and time. Coordination normal.  Psychiatric: She has a normal mood and affect. Her behavior is normal. Judgment and thought content normal.  Nursing note and vitals reviewed.    ED Treatments / Results  Labs (all labs ordered are listed, but only abnormal results are displayed) Labs Reviewed  HIV ANTIBODY (ROUTINE TESTING)    EKG  EKG Interpretation None       Radiology Dg Abd Acute W/chest  Result Date: 11/04/2017 CLINICAL DATA:  Severe abdominal pain for 2 weeks, chest pain. History of hypertension, tobacco abuse, diverticulitis. EXAM: DG ABDOMEN ACUTE W/ 1V CHEST COMPARISON:  CT abdomen and pelvis March 28, 2015 FINDINGS: Cardiomediastinal silhouette is normal. Calcified aortic knob. Mild bronchitic changes. Lungs are clear, no pleural effusions. No  pneumothorax. Soft tissue planes and included osseous structures are unremarkable. Bowel gas pattern is nondilated and nonobstructive. Moderate amount of retained large bowel stool. Calcified leiomyoma RIGHT pelvis. Aortoiliac calcific atherosclerosis. No intra-abdominal mass effect, pathologic calcifications or free air. Soft tissue planes and included osseous structures are non-suspicious. IMPRESSION: Mild bronchitic changes.  No focal consolidation. Normal bowel gas pattern. Moderate volume retained large bowel stool. Electronically Signed   By: Elon Alas M.D.   On: 11/04/2017 01:38    Procedures Procedures (including critical care time)  Medications Ordered in ED Medications - No data to display   Initial Impression / Assessment and Plan / ED Course  I have reviewed the triage vital signs and the nursing notes.  Pertinent labs & imaging results that were available during my care of the patient were reviewed by me and considered in my medical decision making (see chart for details).      Final Clinical Impressions(s) / ED Diagnoses   Final diagnoses:  Exposure to STD    56 year old female presents today with request for STD testing.  Patient has no physical complaints related to STDs, she will have blood drawn and discharged with outpatient follow-up.  ED Discharge Orders    None       Okey Regal, PA-C 11/04/17 [place], [place], DO 11/04/17 [phone]

## 2017-11-04 NOTE — Discharge Instructions (Signed)
Please read attached information. If you experience any new or worsening signs or symptoms please return to the emergency room for evaluation. Please follow-up with your primary care provider or specialist as discussed.  °

## 2017-11-04 NOTE — ED Notes (Signed)
Patient Alert and oriented X4. Stable and ambulatory. Patient verbalized understanding of the discharge instructions.  Patient belongings were taken by the patient.  

## 2017-11-04 NOTE — ED Notes (Signed)
Lab contacted about iStat lactic, needs to be redone

## 2017-11-04 NOTE — ED Triage Notes (Signed)
Pt arrives to triage in tears and crying and states she needs to be checked for HIV because someone just called her and told her she might have been exposed. Pt states she just left but she came back to see a doctor about getting tested for HIV and also she needs to speak to a Education officer, museum per pt. Pt just seen in this ed this monring.

## 2017-11-04 NOTE — ED Notes (Signed)
Patient verbalizes understanding of discharge instructions. Opportunity for questioning and answers were provided. 

## 2017-11-05 LAB — HIV ANTIBODY (ROUTINE TESTING W REFLEX): HIV Screen 4th Generation wRfx: NONREACTIVE

## 2017-12-18 DIAGNOSIS — F315 Bipolar disorder, current episode depressed, severe, with psychotic features: Secondary | ICD-10-CM | POA: Diagnosis not present

## 2017-12-19 DIAGNOSIS — F315 Bipolar disorder, current episode depressed, severe, with psychotic features: Secondary | ICD-10-CM | POA: Diagnosis not present

## 2018-01-07 ENCOUNTER — Other Ambulatory Visit: Payer: Self-pay

## 2018-01-07 ENCOUNTER — Encounter (HOSPITAL_COMMUNITY): Payer: Self-pay

## 2018-01-07 ENCOUNTER — Emergency Department (HOSPITAL_COMMUNITY)
Admission: EM | Admit: 2018-01-07 | Discharge: 2018-01-07 | Disposition: A | Discharge status: Home / Self Care | Payer: Medicare Other | Attending: Emergency Medicine | Admitting: Emergency Medicine

## 2018-01-07 DIAGNOSIS — I10 Essential (primary) hypertension: Secondary | ICD-10-CM | POA: Diagnosis not present

## 2018-01-07 DIAGNOSIS — J45909 Unspecified asthma, uncomplicated: Secondary | ICD-10-CM | POA: Diagnosis not present

## 2018-01-07 DIAGNOSIS — R519 Headache, unspecified: Secondary | ICD-10-CM

## 2018-01-07 DIAGNOSIS — F17210 Nicotine dependence, cigarettes, uncomplicated: Secondary | ICD-10-CM | POA: Insufficient documentation

## 2018-01-07 DIAGNOSIS — R51 Headache: Secondary | ICD-10-CM | POA: Insufficient documentation

## 2018-01-07 DIAGNOSIS — Z79899 Other long term (current) drug therapy: Secondary | ICD-10-CM | POA: Insufficient documentation

## 2018-01-07 DIAGNOSIS — Z7982 Long term (current) use of aspirin: Secondary | ICD-10-CM | POA: Insufficient documentation

## 2018-01-07 LAB — CBC WITH DIFFERENTIAL/PLATELET
BASOS ABS: 0 10*3/uL (ref 0.0–0.1)
BASOS PCT: 0 %
EOS ABS: 0.2 10*3/uL (ref 0.0–0.7)
EOS PCT: 3 %
HCT: 46.7 % — ABNORMAL HIGH (ref 36.0–46.0)
Hemoglobin: 15.5 g/dL — ABNORMAL HIGH (ref 12.0–15.0)
Lymphocytes Relative: 47 %
Lymphs Abs: 3.6 10*3/uL (ref 0.7–4.0)
MCH: 32.4 pg (ref 26.0–34.0)
MCHC: 33.2 g/dL (ref 30.0–36.0)
MCV: 97.5 fL (ref 78.0–100.0)
Monocytes Absolute: 0.5 10*3/uL (ref 0.1–1.0)
Monocytes Relative: 6 %
Neutro Abs: 3.4 10*3/uL (ref 1.7–7.7)
Neutrophils Relative %: 44 %
PLATELETS: 205 10*3/uL (ref 150–400)
RBC: 4.79 MIL/uL (ref 3.87–5.11)
RDW: 12.7 % (ref 11.5–15.5)
WBC: 7.7 10*3/uL (ref 4.0–10.5)

## 2018-01-07 LAB — BASIC METABOLIC PANEL
ANION GAP: 8 (ref 5–15)
BUN: 18 mg/dL (ref 6–20)
CALCIUM: 9 mg/dL (ref 8.9–10.3)
CO2: 24 mmol/L (ref 22–32)
Chloride: 107 mmol/L (ref 101–111)
Creatinine, Ser: 1.03 mg/dL — ABNORMAL HIGH (ref 0.44–1.00)
GFR calc Af Amer: >60 mL/min (ref >60)
Glucose, Bld: 95 mg/dL (ref 65–99)
Potassium: 3.8 mmol/L (ref 3.5–5.1)
SODIUM: 139 mmol/L (ref 135–145)

## 2018-01-07 MED ORDER — PROCHLORPERAZINE EDISYLATE 5 MG/ML IJ SOLN
10.0000 mg | Freq: Once | INTRAMUSCULAR | Status: AC
  Administered 2018-01-07: 10 mg via INTRAVENOUS
  Filled 2018-01-07: qty 2

## 2018-01-07 MED ORDER — HYDROCHLOROTHIAZIDE 25 MG PO TABS: 25.0000 mg | ORAL_TABLET | Freq: Every day | ORAL | 0 refills | Status: DC

## 2018-01-07 MED ORDER — LISINOPRIL 20 MG PO TABS: 20.0000 mg | ORAL_TABLET | Freq: Two times a day (BID) | ORAL | 0 refills | Status: DC

## 2018-01-07 MED ORDER — KETOROLAC TROMETHAMINE 15 MG/ML IJ SOLN
15.0000 mg | Freq: Once | INTRAMUSCULAR | Status: AC
  Administered 2018-01-07: 15 mg via INTRAVENOUS
  Filled 2018-01-07: qty 1

## 2018-01-07 MED ORDER — DIPHENHYDRAMINE HCL 50 MG/ML IJ SOLN
25.0000 mg | Freq: Once | INTRAMUSCULAR | Status: AC
  Administered 2018-01-07: 25 mg via INTRAVENOUS
  Filled 2018-01-07: qty 1

## 2018-01-07 MED ORDER — DEXAMETHASONE SODIUM PHOSPHATE 10 MG/ML IJ SOLN
10.0000 mg | Freq: Once | INTRAMUSCULAR | Status: AC
  Administered 2018-01-07: 10 mg via INTRAVENOUS
  Filled 2018-01-07: qty 1

## 2018-01-07 MED ORDER — DIPHENHYDRAMINE HCL 50 MG/ML IJ SOLN
25.0000 mg | Freq: Once | INTRAMUSCULAR | Status: AC
  Administered 2018-01-07: 25 mg via INTRAVENOUS

## 2018-01-07 MED ORDER — OXYMETAZOLINE HCL 0.05 % NA SOLN: 1.0000 | Freq: Two times a day (BID) | NASAL | 0 refills | Status: AC

## 2018-01-07 MED ORDER — FLUTICASONE PROPIONATE 50 MCG/ACT NA SUSP: 1.0000 | Freq: Every day | NASAL | 0 refills | Status: DC

## 2018-01-07 MED ORDER — SODIUM CHLORIDE 0.9 % IV BOLUS (SEPSIS)
500.0000 mL | Freq: Once | INTRAVENOUS | Status: AC
  Administered 2018-01-07: 500 mL via INTRAVENOUS

## 2018-01-07 NOTE — ED Notes (Signed)
Phlebotomy at bedside.

## 2018-01-07 NOTE — ED Notes (Signed)
ED Provider at bedside. 

## 2018-01-07 NOTE — ED Provider Notes (Signed)
[facility] EMERGENCY DEPARTMENT Provider Note   CSN: [id number] Arrival date & time: 01/07/18  1125     History   Chief Complaint Chief Complaint  Patient presents with  . Headache  . Palpitations    HPI Alisha [name] is a 57 y.o. female presenting for evaluation of headache.  Patient states for the past 5 days she has had a persistent headache.  It initially was bilateral, but now is worse on the left side.  Headache is described as throbbing, is constant.  It did not improve with one ibuprofen tablet.  She has not tried anything else for pain.  He has associated photophobia and nausea.  She has not vomited.  She reports just prior to the symptoms starting, she had contact with her sick grandbaby and was sneezing.  She reports associated nasal congestion.  She has not taken anything for this.  She is concerned this is related to her blood pressure, as she has gone several months without blood pressure medicines.  She is to find a new primary care to prescribe her her blood pressure medications.  She denies vision changes, slurred speech, weakness, chest pain, shortness of breath, or abdominal pain.  He denies fall, trauma, or injury.  When she told triage she was having palpitations, she meant to say that when her head hurts very bad, she takes short shallow breaths which makes her heart beat funny.  She is not currently having any palpitations.  Patient denies any drug use recently.   HPI  Past Medical History:  Diagnosis Date  . Anxiety   . Asthma   . Breast lump 02/2008   Biopsy 05/2008: showed no evidence of malignancy  . Breast mass in female    bi lat   . Chronic pain syndrome   . Degenerative joint disease of knee   . Depression   . 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 Endometrial mass 10/12/2012   Endometrium, biopsy on 11/04/12 - PROLIFERATIVE ENDOMETRIUM AND ABUNDANT MUCUS. NO HYPERPLASIA OR  CARCINOMA.   . Hyperlipidemia   . Hypertension   . Irregular menses 9/08  . Joint pain   . Mixed stress and urge urinary incontinence    Being followed by [facility], underwent cystoscopy & uroflowmetry   . Pulmonary edema   . PVD (peripheral vascular disease) ([place])    s/p L fem-pop bypass 11/21/08; graft occluded 12/28/08;  aortogram w/ bilat LE runoff: 1.  bilat diffuse SFA occlusive dz, 2.  Mod to severe above-knee popliteal dz,  3.  Bilat 3-vessel runoff w/ mild tibial occlusive dz.  . Restless leg syndrome   . Sigmoid diverticulitis 10/26/2012  . Tobacco abuse     Patient Active Problem List   Diagnosis Date Noted  . Asthma 10/07/2016  . Colonic diverticular abscess   . Anxiety 08/28/2014  . Breast pain, left 08/11/2014  . Abnormal ECG 01/28/2014  . Health care maintenance 09/16/2013  . Metrorrhagia 10/12/2012  . Endometrial mass 10/12/2012  . 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 Osteoarthritis of both knees 02/15/2012  . Chronic pain syndrome 07/05/2010  . Venous thromboembolism 02/22/2010  . Depression 07/17/2009  . 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 TOBACCO ABUSE 10/20/2006  . Essential hypertension 10/20/2006  . Peripheral vascular disease ([place]) 10/20/2006  . HYPERLIPIDEMIA, MIXED 10/10/2006    Past Surgical History:  Procedure Laterality Date  .  Right external iliac artery  stent    . BREAST LUMPECTOMY WITH RADIOACTIVE SEED LOCALIZATION Bilateral 05/03/2014   Procedure: BREAST LUMPECTOMY WITH RADIOACTIVE SEED LOCALIZATION;  Surgeon: Joyice Faster. Cornett, MD;  Location: [facility];  Service: General;  Laterality: Bilateral;  . CARDIAC CATHETERIZATION  2/15  . CAROTID ENDARTERECTOMY    . FEMORAL BYPASS  11/09  . LEFT HEART CATHETERIZATION WITH CORONARY ANGIOGRAM N/A 02/25/2014   Procedure: LEFT HEART CATHETERIZATION WITH CORONARY ANGIOGRAM;  Surgeon: Peter M Martinique, MD;  Location: [facility] CATH LAB;  Service: Cardiovascular;   Laterality: N/A;  . PR VEIN BYPASS GRAFT,AORTO-FEM-POP    . Right breast needle-localized lumpectomy    . TONSILLECTOMY    . TUBAL LIGATION      OB History    Gravida Para Term Preterm AB Living   6 4 4   2 4    SAB TAB Ectopic Multiple Live Births   1 1             Home Medications    Prior to Admission medications   Medication Sig Start Date End Date Taking? Authorizing Provider  albuterol (PROVENTIL HFA;VENTOLIN HFA) 108 (90 Base) MCG/ACT inhaler Inhale 1-2 puffs into the lungs every 4 (four) hours as needed for wheezing or shortness of breath. 10/07/16  Yes Shela Leff, MD  aspirin EC 81 MG tablet Take 81 mg by mouth daily.   Yes [provider]  atorvastatin (LIPITOR) 40 MG tablet Take 1 tablet (40 mg total) by mouth at bedtime. 11/05/13  Yes Emokpae, Ejiroghene E, MD  busPIRone (BUSPAR) 10 MG tablet Take 10 mg by mouth 3 (three) times daily.   Yes [provider]  carvedilol (COREG) 25 MG tablet Take 1 tablet (25 mg total) by mouth 2 (two) times daily with a meal. Patient taking differently: Take 12.5 mg by mouth daily.  10/06/17  Yes Dorie Rank, MD  diazepam (VALIUM) 5 MG tablet Take 5 mg by mouth 3 (three) times daily.    Yes [provider]  DULoxetine (CYMBALTA) 60 MG capsule Take 60 mg by mouth daily.    Yes [provider]  ziprasidone (GEODON) 60 MG capsule Take 60 mg by mouth 2 (two) times daily with a meal.    Yes [provider]  amLODipine (NORVASC) 2.5 MG tablet Take 1 tablet (2.5 mg total) by mouth daily. Patient not taking: Reported on 01/07/2018 10/06/17   Dorie Rank, MD  diclofenac sodium (VOLTAREN) 1 % GEL Apply 2 g topically 4 (four) times daily. Patient not taking: Reported on 10/06/2017 08/16/15   Norman Herrlich, MD  dicyclomine (BENTYL) 20 MG tablet Take 1 tablet (20 mg total) every 12 (twelve) hours as needed by mouth (abdominal pain/cramping). Patient not taking: Reported on 01/07/2018 11/04/17   Antonietta Breach,  PA-C  fluticasone [facility]) 50 MCG/ACT nasal spray Place 1 spray into both nostrils daily. 01/07/18   Abbagayle Zaragoza, PA-C  gabapentin (NEURONTIN) 800 MG tablet TAKE 1 TABLET BY MOUTH IN THE MORNING, 1 TABLET BY MOUTH MIDDAY, AND 2 TABLETS BY MOUTH AT BEDTIME Patient not taking: Reported on 10/06/2017 08/16/15   Norman Herrlich, MD  hydrochlorothiazide (HYDRODIURIL) 25 MG tablet Take 1 tablet (25 mg total) by mouth daily. 01/07/18 02/06/18  Haji Delaine, PA-C  lisinopril (PRINIVIL,ZESTRIL) 20 MG tablet Take 1 tablet (20 mg total) by mouth 2 (two) times daily. 01/07/18 02/06/18  Lashawn Bromwell, PA-C  omeprazole (PRILOSEC) 20 MG capsule Take 1 capsule (20 mg total) daily by mouth. Patient not taking:  Reported on 01/07/2018 11/04/17   Antonietta Breach, PA-C  oxymetazoline (AFRIN NASAL SPRAY) 0.05 % nasal spray Place 1 spray into both nostrils 2 (two) times daily for 2 days. 01/07/18 01/09/18  Earsie Humm, PA-C  potassium chloride (K-DUR) 10 MEQ tablet TAKE 1 TABLET BY MOUTH EVERY DAY Patient not taking: Reported on 10/06/2017 08/29/14   Nahser, Wonda Cheng, MD  warfarin (COUMADIN) 7.5 MG tablet Take 3 tablets all days of week EXCEPT on Thursdays and Saturdays--take 2&1/2 tablets. Patient not taking: Reported on 01/07/2018 10/06/17   Dorie Rank, MD    Family History Family History  Problem Relation Age of Onset  . Breast cancer Mother   . Breast cancer Other        GREAT AUNT  . Colon cancer Neg Hx   . Rectal cancer Neg Hx   . Stomach cancer Neg Hx     Social History Social History   Tobacco Use  . Smoking status: Current Every Day Smoker    Packs/day: 0.50    Years: 18.00    Pack years: 9.00    Types: Cigarettes  . Smokeless tobacco: Never Used  . Tobacco comment: 1pk 1/2 PACK DAY 2-3 0r none  Substance Use Topics  . Alcohol use: Yes    Alcohol/week: 0.0 oz    Comment: rare  . Drug use: No    Comment: hx of marijuana and cocaine use     Allergies   Propoxyphene n-acetaminophen and  Strawberry extract   Review of Systems Review of Systems  Constitutional: Negative for chills and fever.  HENT: Positive for congestion, sinus pressure and sinus pain.   Eyes: Positive for photophobia. Negative for visual disturbance.  Respiratory: Negative for cough, chest tightness and shortness of breath.   Cardiovascular: Negative for chest pain, palpitations and leg swelling.  Gastrointestinal: Positive for nausea. Negative for abdominal pain.  Genitourinary: Negative for dysuria, frequency and hematuria.  Musculoskeletal: Negative for back pain.  Skin: Negative for rash.  Neurological: Positive for headaches. Negative for weakness.  Psychiatric/Behavioral: Negative for confusion.     Physical Exam Updated Vital Signs BP 133/88   Pulse 84   Temp (!) 97.5 F (36.4 C) (Oral)   Resp 17   LMP 11/22/2015   SpO2 96%   Physical Exam  Constitutional: She is oriented to person, place, and time. She appears well-developed and well-nourished. No distress.  HENT:  Head: Normocephalic and atraumatic.  Right Ear: Tympanic membrane, external ear and ear canal normal.  Left Ear: Tympanic membrane, external ear and ear canal normal.  Nose: Mucosal edema present. Right sinus exhibits frontal sinus tenderness. Right sinus exhibits no maxillary sinus tenderness. Left sinus exhibits frontal sinus tenderness. Left sinus exhibits no maxillary sinus tenderness.  Nasal mucosal edema.  Tenderness palpation of frontal sinuses.  OP clear without tonsillar swelling or exudate.  Uvula midline with equal palate rise.  TMs without bulging or erythema.  Eyes: Conjunctivae and EOM are normal. Pupils are equal, round, and reactive to light.  EOMI without pain.  Neck: Normal range of motion. Neck supple.  Cardiovascular: Normal rate, regular rhythm and intact distal pulses.  Pulmonary/Chest: Effort normal and breath sounds normal. No respiratory distress. She has no wheezes.  Abdominal: Soft. She exhibits  no distension and no mass. There is no tenderness. There is no guarding.  Musculoskeletal: Normal range of motion.  Strength intact x4.  Sensation intact x4.  Radial and pedal pulses equal bilaterally.  Pt is ambulatory without difficulty.  Neurological: She is alert and oriented to person, place, and time. She has normal strength. No cranial nerve deficit or sensory deficit. Coordination and gait normal. GCS eye subscore is 4. GCS verbal subscore is 5. GCS motor subscore is 6.  Skin: Skin is warm and dry.  Psychiatric: She has a normal mood and affect.  Nursing note and vitals reviewed.    ED Treatments / Results  Labs (all labs ordered are listed, but only abnormal results are displayed) Labs Reviewed  CBC WITH DIFFERENTIAL/PLATELET - Abnormal; Notable for the following components:      Result Value   Hemoglobin 15.5 (*)    HCT 46.7 (*)    All other components within normal limits  BASIC METABOLIC PANEL - Abnormal; Notable for the following components:   Creatinine, Ser 1.03 (*)    All other components within normal limits    EKG  EKG Interpretation  Date/Time:  Wednesday January 07 2018 12:07:24 EST Ventricular Rate:  92 PR Interval:  166 QRS Duration: 86 QT Interval:  388 QTC Calculation: 479 R Axis:   -19 Text Interpretation:  Normal sinus rhythm Possible Left atrial enlargement Left ventricular hypertrophy Cannot rule out Septal infarct , age undetermined Abnormal ECG No significant change since last tracing Confirmed by Wandra Arthurs [phone]) on 01/07/2018 7:15:00 PM       Radiology No results found.  Procedures Procedures (including critical care time)  Medications Ordered in ED Medications  sodium chloride 0.9 % bolus 500 mL (0 mLs Intravenous Stopped 01/07/18 2148)  ketorolac (TORADOL) 15 MG/ML injection 15 mg (15 mg Intravenous Given 01/07/18 2013)  dexamethasone (DECADRON) injection 10 mg (10 mg Intravenous Given 01/07/18 2014)  prochlorperazine (COMPAZINE)  injection 10 mg (10 mg Intravenous Given 01/07/18 2014)  diphenhydrAMINE (BENADRYL) injection 25 mg (25 mg Intravenous Given 01/07/18 2012)  diphenhydrAMINE (BENADRYL) injection 25 mg (25 mg Intravenous Given 01/07/18 2023)     Initial Impression / Assessment and Plan / ED Course  I have reviewed the triage vital signs and the nursing notes.  Pertinent labs & imaging results that were available during my care of the patient were reviewed by me and considered in my medical decision making (see chart for details).     Patient presenting for evaluation of headache.  Physical exam reassuring, no obvious neurologic deficits.  Likely sinus headache.  Will give headache cocktail.  Discussed case with attending, Dr. Darl Householder evaluated the patient.  Will obtain basic lab work to ensure no abnormalities or signs of endorgan damage.  Labs reassuring.  Creatinine stable.  On reassessment, patient reports headache is completely resolved.  Blood pressure 126/90.  I will not refill blood pressure medications.  Prescription for Afrin and Flonase given.  Patient given strict instructions about stopping Afrin after 2 days.  Discussed importance of hydration.  Discussed follow-up with primary care.  At this time, patient appears safe for discharge.  Return precautions given.  Patient states she understands and agrees to plan.  Final Clinical Impressions(s) / ED Diagnoses   Final diagnoses:  Acute nonintractable headache, unspecified headache type    ED Discharge Orders        Ordered    fluticasone (FLONASE) 50 MCG/ACT nasal spray  Daily     01/07/18 2225    oxymetazoline (AFRIN NASAL SPRAY) 0.05 % nasal spray  2 times daily     01/07/18 2225    lisinopril (PRINIVIL,ZESTRIL) 20 MG tablet  2 times daily     01/07/18 2225  hydrochlorothiazide (HYDRODIURIL) 25 MG tablet  Daily    Comments:  **Patient requests 90 days supply**   01/07/18 2225      I did not give prescription for lisinopril and HCTZ.     Franchot Heidelberg, PA-C 01/07/18 2234    Drenda Freeze, MD 01/09/18 1058

## 2018-01-07 NOTE — ED Notes (Signed)
After administration of ordered meds pt reported itching at site of IV. Urticaria noted 2-3 inches above IV site. EDP notified, 25 mg bendryl verbal order given

## 2018-01-07 NOTE — ED Triage Notes (Signed)
PT reports headache that has been hurting since last Wednesday. Pt states she his high blood pressure and thinks they may be related. Pt bp mildly elevated in ED. Pt reports she feel like heart is racing with the pain and endorses palpitations.

## 2018-01-07 NOTE — ED Notes (Signed)
Pt reports severe HA x 1 week that was relieved by ibuprofen initially but is now 10/10 and relieved by nothing. Pt also report N/V.

## 2018-01-07 NOTE — Discharge Instructions (Signed)
It is important that you to establish primary care and follow-up with a primary care doctor for evaluation of your blood pressure. Use Afrin and flonase twice a day for the next 2 days.  After this, stop using Afrin.  Continue to use Flonase daily until symptoms resolve.  Return to the emergency room if you develop vision changes, slurred speech, weakness, chest pain, or any new or concerning symptoms.

## 2018-02-04 DIAGNOSIS — F315 Bipolar disorder, current episode depressed, severe, with psychotic features: Secondary | ICD-10-CM | POA: Diagnosis not present

## 2018-02-15 ENCOUNTER — Observation Stay (HOSPITAL_COMMUNITY)
Admission: EM | Admit: 2018-02-15 | Discharge: 2018-02-17 | Disposition: A | Discharge status: Home / Self Care | Payer: Medicare Other | Attending: Internal Medicine | Admitting: Internal Medicine

## 2018-02-15 ENCOUNTER — Emergency Department (HOSPITAL_COMMUNITY): Payer: Medicare Other

## 2018-02-15 ENCOUNTER — Encounter (HOSPITAL_COMMUNITY): Payer: Self-pay | Admitting: Radiology

## 2018-02-15 DIAGNOSIS — Z23 Encounter for immunization: Secondary | ICD-10-CM | POA: Diagnosis not present

## 2018-02-15 DIAGNOSIS — J45909 Unspecified asthma, uncomplicated: Secondary | ICD-10-CM | POA: Insufficient documentation

## 2018-02-15 DIAGNOSIS — Z7901 Long term (current) use of anticoagulants: Secondary | ICD-10-CM | POA: Insufficient documentation

## 2018-02-15 DIAGNOSIS — G2581 Restless legs syndrome: Secondary | ICD-10-CM | POA: Diagnosis not present

## 2018-02-15 DIAGNOSIS — G894 Chronic pain syndrome: Secondary | ICD-10-CM | POA: Diagnosis not present

## 2018-02-15 DIAGNOSIS — I5020 Unspecified systolic (congestive) heart failure: Secondary | ICD-10-CM | POA: Diagnosis not present

## 2018-02-15 DIAGNOSIS — R0602 Shortness of breath: Secondary | ICD-10-CM | POA: Diagnosis not present

## 2018-02-15 DIAGNOSIS — R402441 Other coma, without documented Glasgow coma scale score, or with partial score reported, in the field [EMT or ambulance]: Secondary | ICD-10-CM | POA: Diagnosis not present

## 2018-02-15 DIAGNOSIS — K760 Fatty (change of) liver, not elsewhere classified: Secondary | ICD-10-CM | POA: Insufficient documentation

## 2018-02-15 DIAGNOSIS — E785 Hyperlipidemia, unspecified: Secondary | ICD-10-CM | POA: Insufficient documentation

## 2018-02-15 DIAGNOSIS — F419 Anxiety disorder, unspecified: Secondary | ICD-10-CM | POA: Insufficient documentation

## 2018-02-15 DIAGNOSIS — R0603 Acute respiratory distress: Secondary | ICD-10-CM

## 2018-02-15 DIAGNOSIS — R4020 Unspecified coma: Secondary | ICD-10-CM

## 2018-02-15 DIAGNOSIS — F329 Major depressive disorder, single episode, unspecified: Secondary | ICD-10-CM | POA: Diagnosis not present

## 2018-02-15 DIAGNOSIS — I700 Atherosclerosis of aorta: Secondary | ICD-10-CM | POA: Diagnosis not present

## 2018-02-15 DIAGNOSIS — I739 Peripheral vascular disease, unspecified: Secondary | ICD-10-CM | POA: Diagnosis not present

## 2018-02-15 DIAGNOSIS — I110 Hypertensive heart disease with heart failure: Secondary | ICD-10-CM | POA: Diagnosis not present

## 2018-02-15 DIAGNOSIS — F17210 Nicotine dependence, cigarettes, uncomplicated: Secondary | ICD-10-CM | POA: Diagnosis not present

## 2018-02-15 DIAGNOSIS — I10 Essential (primary) hypertension: Secondary | ICD-10-CM

## 2018-02-15 DIAGNOSIS — Z79899 Other long term (current) drug therapy: Secondary | ICD-10-CM | POA: Insufficient documentation

## 2018-02-15 DIAGNOSIS — Z7982 Long term (current) use of aspirin: Secondary | ICD-10-CM | POA: Insufficient documentation

## 2018-02-15 DIAGNOSIS — D751 Secondary polycythemia: Secondary | ICD-10-CM | POA: Diagnosis not present

## 2018-02-15 DIAGNOSIS — I2510 Atherosclerotic heart disease of native coronary artery without angina pectoris: Secondary | ICD-10-CM | POA: Diagnosis not present

## 2018-02-15 DIAGNOSIS — T80818A Extravasation of other vesicant agent, initial encounter: Secondary | ICD-10-CM | POA: Diagnosis not present

## 2018-02-15 DIAGNOSIS — Z95828 Presence of other vascular implants and grafts: Secondary | ICD-10-CM | POA: Diagnosis not present

## 2018-02-15 DIAGNOSIS — R0600 Dyspnea, unspecified: Secondary | ICD-10-CM | POA: Diagnosis not present

## 2018-02-15 DIAGNOSIS — R55 Syncope and collapse: Secondary | ICD-10-CM

## 2018-02-15 LAB — TROPONIN I
TROPONIN I: 0.04 ng/mL — AB (ref <0.03)
TROPONIN I: 0.04 ng/mL — AB (ref <0.03)

## 2018-02-15 LAB — CBC WITH DIFFERENTIAL/PLATELET
Basophils Absolute: 0.1 10*3/uL (ref 0.0–0.1)
Basophils Relative: 1 %
Eosinophils Absolute: 0.3 10*3/uL (ref 0.0–0.7)
Eosinophils Relative: 3 %
HEMATOCRIT: 51 % — AB (ref 36.0–46.0)
HEMOGLOBIN: 16.8 g/dL — AB (ref 12.0–15.0)
LYMPHS ABS: 5.6 10*3/uL — AB (ref 0.7–4.0)
Lymphocytes Relative: 51 %
MCH: 32.7 pg (ref 26.0–34.0)
MCHC: 32.9 g/dL (ref 30.0–36.0)
MCV: 99.4 fL (ref 78.0–100.0)
MONO ABS: 0.6 10*3/uL (ref 0.1–1.0)
MONOS PCT: 5 %
NEUTROS ABS: 4.4 10*3/uL (ref 1.7–7.7)
NEUTROS PCT: 40 %
Platelets: 231 10*3/uL (ref 150–400)
RBC: 5.13 MIL/uL — ABNORMAL HIGH (ref 3.87–5.11)
RDW: 12.8 % (ref 11.5–15.5)
WBC: 10.9 10*3/uL — ABNORMAL HIGH (ref 4.0–10.5)

## 2018-02-15 LAB — I-STAT TROPONIN, ED: Troponin i, poc: 0.01 ng/mL (ref 0.00–0.08)

## 2018-02-15 LAB — I-STAT CHEM 8, ED
BUN: 15 mg/dL (ref 6–20)
CREATININE: 1.1 mg/dL — AB (ref 0.44–1.00)
Calcium, Ion: 1.08 mmol/L — ABNORMAL LOW (ref 1.15–1.40)
Chloride: 110 mmol/L (ref 101–111)
Glucose, Bld: 302 mg/dL — ABNORMAL HIGH (ref 65–99)
HEMATOCRIT: 52 % — AB (ref 36.0–46.0)
HEMOGLOBIN: 17.7 g/dL — AB (ref 12.0–15.0)
Potassium: 4 mmol/L (ref 3.5–5.1)
Sodium: 144 mmol/L (ref 135–145)
TCO2: 24 mmol/L (ref 22–32)

## 2018-02-15 LAB — COMPREHENSIVE METABOLIC PANEL
ALT: 31 U/L (ref 14–54)
ANION GAP: 15 (ref 5–15)
AST: 55 U/L — ABNORMAL HIGH (ref 15–41)
Albumin: 3.6 g/dL (ref 3.5–5.0)
Alkaline Phosphatase: 97 U/L (ref 38–126)
BILIRUBIN TOTAL: 0.9 mg/dL (ref 0.3–1.2)
BUN: 13 mg/dL (ref 6–20)
CO2: 18 mmol/L — AB (ref 22–32)
CREATININE: 1.22 mg/dL — AB (ref 0.44–1.00)
Calcium: 8.3 mg/dL — ABNORMAL LOW (ref 8.9–10.3)
Chloride: 109 mmol/L (ref 101–111)
GFR calc non Af Amer: 49 mL/min — ABNORMAL LOW (ref >60)
GFR, EST AFRICAN AMERICAN: 57 mL/min — AB (ref >60)
Glucose, Bld: 315 mg/dL — ABNORMAL HIGH (ref 65–99)
Potassium: 4.1 mmol/L (ref 3.5–5.1)
Sodium: 142 mmol/L (ref 135–145)
Total Protein: 7 g/dL (ref 6.5–8.1)

## 2018-02-15 LAB — RAPID URINE DRUG SCREEN, HOSP PERFORMED
Amphetamines: NOT DETECTED
Barbiturates: NOT DETECTED
Benzodiazepines: NOT DETECTED
Cocaine: NOT DETECTED
Opiates: NOT DETECTED
Tetrahydrocannabinol: NOT DETECTED

## 2018-02-15 LAB — BRAIN NATRIURETIC PEPTIDE: B Natriuretic Peptide: 132 pg/mL — ABNORMAL HIGH (ref 0.0–100.0)

## 2018-02-15 LAB — PROTIME-INR
INR: 0.95
PROTHROMBIN TIME: 12.5 s (ref 11.4–15.2)

## 2018-02-15 LAB — CBG MONITORING, ED: Glucose-Capillary: 296 mg/dL — ABNORMAL HIGH (ref 65–99)

## 2018-02-15 MED ORDER — ZIPRASIDONE HCL 40 MG PO CAPS
60.0000 mg | ORAL_CAPSULE | Freq: Two times a day (BID) | ORAL | Status: DC
  Administered 2018-02-16 – 2018-02-17 (×4): 60 mg via ORAL
  Filled 2018-02-15 (×5): qty 1

## 2018-02-15 MED ORDER — WHITE PETROLATUM EX OINT
TOPICAL_OINTMENT | CUTANEOUS | Status: DC | PRN
  Filled 2018-02-15: qty 28.35

## 2018-02-15 MED ORDER — ACETAMINOPHEN 650 MG RE SUPP: 650.0000 mg | Freq: Four times a day (QID) | RECTAL | Status: DC | PRN

## 2018-02-15 MED ORDER — METOCLOPRAMIDE HCL 5 MG/ML IJ SOLN
10.0000 mg | Freq: Once | INTRAMUSCULAR | Status: AC
  Administered 2018-02-15: 10 mg via INTRAVENOUS

## 2018-02-15 MED ORDER — ALBUTEROL SULFATE (2.5 MG/3ML) 0.083% IN NEBU: 2.5000 mg | INHALATION_SOLUTION | Freq: Four times a day (QID) | RESPIRATORY_TRACT | Status: DC | PRN

## 2018-02-15 MED ORDER — FUROSEMIDE 40 MG PO TABS
40.0000 mg | ORAL_TABLET | Freq: Every day | ORAL | Status: DC
  Administered 2018-02-15: 40 mg via ORAL
  Filled 2018-02-15: qty 2
  Filled 2018-02-15: qty 1

## 2018-02-15 MED ORDER — IOPAMIDOL (ISOVUE-370) INJECTION 76%
INTRAVENOUS | Status: AC
  Administered 2018-02-15: 80 mL
  Filled 2018-02-15: qty 100

## 2018-02-15 MED ORDER — ONDANSETRON HCL 4 MG/2ML IJ SOLN
INTRAMUSCULAR | Status: AC
  Filled 2018-02-15: qty 2

## 2018-02-15 MED ORDER — INFLUENZA VAC SPLIT QUAD 0.5 ML IM SUSY
0.5000 mL | PREFILLED_SYRINGE | INTRAMUSCULAR | Status: AC
  Administered 2018-02-17: 0.5 mL via INTRAMUSCULAR
  Filled 2018-02-15: qty 0.5

## 2018-02-15 MED ORDER — IPRATROPIUM BROMIDE 0.02 % IN SOLN
0.5000 mg | Freq: Once | RESPIRATORY_TRACT | Status: AC
  Administered 2018-02-15: 0.5 mg via RESPIRATORY_TRACT
  Filled 2018-02-15: qty 2.5

## 2018-02-15 MED ORDER — ALBUTEROL (5 MG/ML) CONTINUOUS INHALATION SOLN
10.0000 mg/h | INHALATION_SOLUTION | Freq: Once | RESPIRATORY_TRACT | Status: AC
  Administered 2018-02-15: 10 mg/h via RESPIRATORY_TRACT
  Filled 2018-02-15: qty 20

## 2018-02-15 MED ORDER — BUSPIRONE HCL 10 MG PO TABS
10.0000 mg | ORAL_TABLET | Freq: Three times a day (TID) | ORAL | Status: DC
  Administered 2018-02-15 – 2018-02-17 (×5): 10 mg via ORAL
  Filled 2018-02-15: qty 2
  Filled 2018-02-15 (×2): qty 1
  Filled 2018-02-15: qty 2
  Filled 2018-02-15: qty 1

## 2018-02-15 MED ORDER — LURASIDONE HCL 20 MG PO TABS
20.0000 mg | ORAL_TABLET | Freq: Every day | ORAL | Status: DC
  Administered 2018-02-15 – 2018-02-17 (×3): 20 mg via ORAL
  Filled 2018-02-15 (×3): qty 1

## 2018-02-15 MED ORDER — ACETAMINOPHEN 325 MG PO TABS
650.0000 mg | ORAL_TABLET | Freq: Four times a day (QID) | ORAL | Status: DC | PRN
  Administered 2018-02-15 – 2018-02-17 (×4): 650 mg via ORAL
  Filled 2018-02-15 (×4): qty 2

## 2018-02-15 MED ORDER — ENOXAPARIN SODIUM 40 MG/0.4ML ~~LOC~~ SOLN
40.0000 mg | SUBCUTANEOUS | Status: DC
  Administered 2018-02-15 – 2018-02-16 (×2): 40 mg via SUBCUTANEOUS
  Filled 2018-02-15 (×3): qty 0.4

## 2018-02-15 NOTE — Plan of Care (Signed)
Pt. States she Less SOB with activity.

## 2018-02-15 NOTE — H&P (Signed)
Date: 02/15/2018               Patient Name:  Alisha [name] MRN: [id number]  DOB: [birth year] Age / Sex: 57 y.o., female   PCP: Patient, No Pcp Per         Medical Service: Internal Medicine Teaching Service         Attending Physician: Dr. Tanna Furry, MD    First Contact: Dr. Johny Chess Pager: [phone]  Second Contact: Dr. Philipp Ovens Pager: [phone]       After Hours (After 5p/  First Contact Pager: [phone]  weekends / holidays): Second Contact Pager: [phone]   Chief Complaint: Syncope  History of Present Illness: Alisha [name] is a 57 yo F with a past medical history of HTN, DM, DVT, PVD s/p bypass of LLE, depression who presented to the ED following an episode of unresponsiveness. Daughters present who assisted with portions of the history.  She reports being in her usual state of health until early this morning around 5:30 am. She experienced DOE when going to the bathroom and had chest heaviness, nausea. Her SOB persisted and she called out to her daughters for help. They noted the patient had increased work of breathing and was then unresponsive. They performed CPR and called EMS. Per the family, EMS stated she had a faint pulse. Documentation here notes agonal breathing with no loss of pulse, she received 0.3 mg Epinephrine, Narcan, and albuterol and was nasally intubated. The patient feels she had similar sx when told she had pulmonary edema. She has only been taking her psychiatric medications for the past several weeks due to running out of her other medications. She has a history of cocaine use, last used three days ago.    In the ED, T 98, HR 93, BP 216/120 initially. She was initially nasally intubated by EMS, however ET tube felt to be supraglottic and was removed, saturating 95% on NRB and adequately protecting airway. Labs showed Cr 1.22, I stat troponin negative, mild edema on CXR, CTA chest negative for PE. Dose of oral Lasix administered. She was admitted for further management.     Meds:  Current Meds  Medication Sig  . aspirin EC 81 MG tablet Take 81 mg by mouth daily.  . busPIRone (BUSPAR) 10 MG tablet Take 10 mg by mouth 3 (three) times daily.  . diazepam (VALIUM) 5 MG tablet Take 5 mg by mouth 3 (three) times daily.   . DULoxetine (CYMBALTA) 60 MG capsule Take 60 mg by mouth daily.   . hydrochlorothiazide (HYDRODIURIL) 25 MG tablet Take 1 tablet (25 mg total) by mouth daily.  Marland Kitchen lisinopril (PRINIVIL,ZESTRIL) 20 MG tablet Take 1 tablet (20 mg total) by mouth 2 (two) times daily.  Marland Kitchen lurasidone (LATUDA) 40 MG TABS tablet Take 20 mg by mouth daily with breakfast. Take 1/2 tablet (20mg ) once daily  . ziprasidone (GEODON) 60 MG capsule Take 60 mg by mouth 2 (two) times daily with a meal.      Allergies: Allergies as of 02/15/2018 - Review Complete 02/15/2018  Allergen Reaction Noted  . Propoxyphene n-acetaminophen Swelling   . Grapefruit extract  02/15/2018  . Strawberry extract  02/11/2015   Past Medical History:  Diagnosis Date  . Anxiety   . Asthma   . Breast lump 02/2008   Biopsy 05/2008: showed no evidence of malignancy  . Breast mass in female    bi lat   . Chronic pain syndrome   . Degenerative  joint disease of knee   . Depression   . 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 Endometrial mass 10/12/2012   Endometrium, biopsy on 11/04/12 - PROLIFERATIVE ENDOMETRIUM AND ABUNDANT MUCUS. NO HYPERPLASIA OR CARCINOMA.   . Hyperlipidemia   . Hypertension   . Irregular menses 9/08  . Joint pain   . Mixed stress and urge urinary incontinence    Being followed by [facility], underwent cystoscopy & uroflowmetry   . Pulmonary edema   . PVD (peripheral vascular disease) ([place])    s/p L fem-pop bypass 11/21/08; graft occluded 12/28/08;  aortogram w/ bilat LE runoff: 1.  bilat diffuse SFA occlusive dz, 2.  Mod to severe above-knee popliteal dz,  3.  Bilat 3-vessel runoff w/ mild tibial occlusive dz.  .  Restless leg syndrome   . Sigmoid diverticulitis 10/26/2012  . Tobacco abuse     Family History:  Family History  Problem Relation Age of Onset  . Breast cancer Mother   . Breast cancer Other        GREAT AUNT  . Colon cancer Neg Hx   . Rectal cancer Neg Hx   . Stomach cancer Neg Hx      Social History:  Social History   Tobacco Use  . Smoking status: Current Every Day Smoker    Packs/day: 0.50    Years: 18.00    Pack years: 9.00    Types: Cigarettes  . Smokeless tobacco: Never Used  . Tobacco comment: 1pk 1/2 PACK DAY 2-3 0r none  Substance Use Topics  . Alcohol use: Yes    Alcohol/week: 0.0 oz    Comment: rare  . Drug use: No    Comment: hx of marijuana and cocaine use     Review of Systems: A complete ROS was negative except as per HPI.   Physical Exam: Blood pressure (!) 145/96, pulse 93, temperature (S) 98 F (36.7 C), temperature source Rectal, resp. rate 20, height 5\' 4"  (1.626 m), weight 212 lb (96.2 kg), last menstrual period 11/22/2015, SpO2 95 %. General: Resting in bed comfortably on NRB, no acute distress Head: Normocephalic, atraumatic  Eyes: PERRL, EOMI. Slight degree of proptosis-unsure of baseline ENT: Blood to nares and NRB mask, moist mucus membranes, no exudate, dental caries  CV: Regular rate and rhythm, no murmur appreciated  Resp: Slight basilar crackles, no wheezing, normal work of breathing, no distress  Abd: Soft, +BS, non-tender to palpation  Extr: No LE edema, remote incisional scar to medial LLE  Neuro: Alert and oriented x3, bilateral tremor to hands, normal coordination, 5/5 upper extremity strength, no FND  Skin: Warm, dry    EKG: personally reviewed my interpretation is sinus tachycardia, borderline QT interval, no ischemic changes appreciated.   CXR: personally reviewed my interpretation is mild bilateral haziness with no focal or lobar consolidation appreciated.   Assessment & Plan by Problem:  Syncope, Dyspnea Pt  presenting with a possible syncopal episode in the setting of shortness of breath and chest discomfort. It is difficult to be confident of the specifics of this episode given the various reports and differential remains broad. EMS reports are reassuring this was not an arrest. She had mild pulm edema noted on CXR and potential aspiration due to the event on CT, no evidence of PE--history of DVTs not on warfarin. Hypertensive urgency with flash pulm edema possible as her BP was acutely elevated on arrival and not taking home meds,  though the pt did receive epinephrine en route and BP has improved without medication, last cocaine use several days ago. Troponin negative and EKG without ischemic changes, borderline QT. Her sx have improved since arrival, no current complaints of chest pain. --Monitor vital signs, tele  --Rpt troponin --Echo  --BMP, UDS --Wean supplemental O2 as able --I/Os   --Albuterol neb q6hr prn    Mild Acute Kidney Injury Cr at presentation 1.22, baseline appears to be 0.7-1 based on prior labs. She did receive contrast for imaging, will monitor closely and encourage PO intake.  H/o HTN Not currently taking medications due to lack of refills and PCP. Reports she was previously taking Coreg, Lisinopril, HCTZ and amlodipine. She is currently normotensive, 121/88, will continue to monitor and resume medications as indicated. Will consult SW/case management for establishing primary care provider.    H/o Depression History of depression based on chart review, pt reports she is currently only taking psychiatric medications. Home medication list includes Buspar 10 mg TID, Latuda 20 mg daily, Geodon 60 mg BID. Ziprasidone may cause QT prolongation--will monitor closely given presentation above.  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 She has not been taking this, INR in normal range. CTA  performed which was negative for evidence of pulmonary embolism. No asymmetric swelling or redness on exam. Will continue to monitor and attempt to clarify clotting history    Dispo: Admit patient to Observation with expected length of stay less than 2 midnights.  Signed: Tawny Asal, MD 02/15/2018, 9:08 AM  Pager: [phone]

## 2018-02-15 NOTE — ED Notes (Signed)
Heart healthy lunch tray ordered 

## 2018-02-15 NOTE — ED Triage Notes (Signed)
Pt BIB [facility] for unresponsive. Pt got up told daughter that she did not feel well and collapsed. Daughter got her out of the bed and to the floor. Pulses were not lost. Pt was agonal breathing when EMS arrived. Pt was nasally intubated. 0.3 epi given IM and 2mg  Narcan given IV

## 2018-02-15 NOTE — ED Provider Notes (Signed)
[place] EMERGENCY DEPARTMENT Provider Note   CSN: [id number] Arrival date & time: 02/15/18  5809     History   Chief Complaint Chief Complaint  Patient presents with  . Respiratory Distress    HPI Alisha [name] is a 57 y.o. female.  Complaint is unresponsive.  HPI 57 year old female.  Here via EMS after episode of difficulty breathing followed by collapse with unresponsiveness.  History of crack cocaine abuse, previous DVT, off warfarin for greater than 2 weeks, hypertension, hyperlipidemia, pulmonary edema, peripheral vascular disease status post femoropopliteal bypass.  Patient remembers getting out of bed and feeling nauseated short of breath.  She went to the bathroom and vomited.  She went to the couch and was short of breath and called for her daughter.  Daughter states that as she approached her on the couch that her mother "eyes seem to bulge out".  She then slumped over to the right on the couch.  Daughter and granddaughter then rolled her onto the floor and "Started  CPR".  Upon arrival of paramedics she was unresponsive but with "agonal breathing".  They state a rate of 6.  She was nasally intubated.  Bag assisted in route.  Never had loss of pulse.  Never had to undergo CPR by paramedics.  Was given nebulized albuterol, and subcu epi by paramedics for "wheezing" in route.  Daughter states that she frequently does crack cocaine.  Daughter states whenever she has "any money, or she is been gone for a little while we know that she has been doing it".  Daughter states that she thinks she was "doing some yesterday".  Past Medical History:  Diagnosis Date  . Anxiety   . Asthma   . Breast lump 02/2008   Biopsy 05/2008: showed no evidence of malignancy  . Breast mass in female    bi lat   . Chronic pain syndrome   . Degenerative joint disease of knee   . Depression   . DVT (deep venous thrombosis) (HCC)    bilateral, 2 episodes: Requires lifelong  therapy  . Endometrial mass 10/12/2012   Endometrium, biopsy on 11/04/12 - PROLIFERATIVE ENDOMETRIUM AND ABUNDANT MUCUS. NO HYPERPLASIA OR CARCINOMA.   . Hyperlipidemia   . Hypertension   . Irregular menses 9/08  . Joint pain   . Mixed stress and urge urinary incontinence    Being followed by [facility], underwent cystoscopy & uroflowmetry   . Pulmonary edema   . PVD (peripheral vascular disease) ([place])    s/p L fem-pop bypass 11/21/08; graft occluded 12/28/08;  aortogram w/ bilat LE runoff: 1.  bilat diffuse SFA occlusive dz, 2.  Mod to severe above-knee popliteal dz,  3.  Bilat 3-vessel runoff w/ mild tibial occlusive dz.  . Restless leg syndrome   . Sigmoid diverticulitis 10/26/2012  . Tobacco abuse     Patient Active Problem List   Diagnosis Date Noted  . Asthma 10/07/2016  . Colonic diverticular abscess   . Anxiety 08/28/2014  . Breast pain, left 08/11/2014  . Abnormal ECG 01/28/2014  . Health care maintenance 09/16/2013  . Metrorrhagia 10/12/2012  . Endometrial mass 10/12/2012  . 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 Osteoarthritis of both knees 02/15/2012  . Chronic pain syndrome 07/05/2010  . Venous thromboembolism 02/22/2010  . Depression 07/17/2009  . 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 TOBACCO ABUSE 10/20/2006  . Essential hypertension 10/20/2006  . Peripheral vascular disease ([place]) 10/20/2006  . HYPERLIPIDEMIA, MIXED 10/10/2006  Past Surgical History:  Procedure Laterality Date  .  Right external iliac artery stent    . BREAST LUMPECTOMY WITH RADIOACTIVE SEED LOCALIZATION Bilateral 05/03/2014   Procedure: BREAST LUMPECTOMY WITH RADIOACTIVE SEED LOCALIZATION;  Surgeon: Joyice Faster. Cornett, MD;  Location: [facility];  Service: General;  Laterality: Bilateral;  . CARDIAC CATHETERIZATION  2/15  . CAROTID ENDARTERECTOMY    . FEMORAL BYPASS  11/09  . LEFT HEART CATHETERIZATION WITH CORONARY ANGIOGRAM N/A 02/25/2014   Procedure:  LEFT HEART CATHETERIZATION WITH CORONARY ANGIOGRAM;  Surgeon: Peter M Martinique, MD;  Location: [facility] CATH LAB;  Service: Cardiovascular;  Laterality: N/A;  . PR VEIN BYPASS GRAFT,AORTO-FEM-POP    . Right breast needle-localized lumpectomy    . TONSILLECTOMY    . TUBAL LIGATION      OB History    Gravida Para Term Preterm AB Living   6 4 4   2 4    SAB TAB Ectopic Multiple Live Births   1 1             Home Medications    Prior to Admission medications   Medication Sig Start Date End Date Taking? Authorizing Provider  albuterol (PROVENTIL HFA;VENTOLIN HFA) 108 (90 Base) MCG/ACT inhaler Inhale 1-2 puffs into the lungs every 4 (four) hours as needed for wheezing or shortness of breath. 10/07/16   Shela Leff, MD  amLODipine (NORVASC) 2.5 MG tablet Take 1 tablet (2.5 mg total) by mouth daily. Patient not taking: Reported on 01/07/2018 10/06/17   Dorie Rank, MD  aspirin EC 81 MG tablet Take 81 mg by mouth daily.    [provider]  atorvastatin (LIPITOR) 40 MG tablet Take 1 tablet (40 mg total) by mouth at bedtime. 11/05/13   Emokpae, Ejiroghene E, MD  busPIRone (BUSPAR) 10 MG tablet Take 10 mg by mouth 3 (three) times daily.    [provider]  carvedilol (COREG) 25 MG tablet Take 1 tablet (25 mg total) by mouth 2 (two) times daily with a meal. Patient taking differently: Take 12.5 mg by mouth daily.  10/06/17   Dorie Rank, MD  diazepam (VALIUM) 5 MG tablet Take 5 mg by mouth 3 (three) times daily.     [provider]  diclofenac sodium (VOLTAREN) 1 % GEL Apply 2 g topically 4 (four) times daily. Patient not taking: Reported on 10/06/2017 08/16/15   Norman Herrlich, MD  dicyclomine (BENTYL) 20 MG tablet Take 1 tablet (20 mg total) every 12 (twelve) hours as needed by mouth (abdominal pain/cramping). Patient not taking: Reported on 01/07/2018 11/04/17   Antonietta Breach, PA-C  DULoxetine (CYMBALTA) 60 MG capsule Take 60 mg by mouth daily.     [provider]    fluticasone (FLONASE) 50 MCG/ACT nasal spray Place 1 spray into both nostrils daily. 01/07/18   Caccavale, Sophia, PA-C  gabapentin (NEURONTIN) 800 MG tablet TAKE 1 TABLET BY MOUTH IN THE MORNING, 1 TABLET BY MOUTH MIDDAY, AND 2 TABLETS BY MOUTH AT BEDTIME Patient not taking: Reported on 10/06/2017 08/16/15   Norman Herrlich, MD  hydrochlorothiazide (HYDRODIURIL) 25 MG tablet Take 1 tablet (25 mg total) by mouth daily. 01/07/18 02/06/18  Caccavale, Sophia, PA-C  lisinopril (PRINIVIL,ZESTRIL) 20 MG tablet Take 1 tablet (20 mg total) by mouth 2 (two) times daily. 01/07/18 02/06/18  Caccavale, Sophia, PA-C  omeprazole (PRILOSEC) 20 MG capsule Take 1 capsule (20 mg total) daily by mouth. Patient not taking: Reported on 01/07/2018 11/04/17   Antonietta Breach, PA-C  potassium chloride (K-DUR) 10 MEQ tablet TAKE 1 TABLET BY MOUTH EVERY DAY Patient not taking: Reported on 10/06/2017 08/29/14   Nahser, Wonda Cheng, MD  warfarin (COUMADIN) 7.5 MG tablet Take 3 tablets all days of week EXCEPT on Thursdays and Saturdays--take 2&1/2 tablets. Patient not taking: Reported on 01/07/2018 10/06/17   Dorie Rank, MD  ziprasidone (GEODON) 60 MG capsule Take 60 mg by mouth 2 (two) times daily with a meal.     [provider]    Family History Family History  Problem Relation Age of Onset  . Breast cancer Mother   . Breast cancer Other        GREAT AUNT  . Colon cancer Neg Hx   . Rectal cancer Neg Hx   . Stomach cancer Neg Hx     Social History Social History   Tobacco Use  . Smoking status: Current Every Day Smoker    Packs/day: 0.50    Years: 18.00    Pack years: 9.00    Types: Cigarettes  . Smokeless tobacco: Never Used  . Tobacco comment: 1pk 1/2 PACK DAY 2-3 0r none  Substance Use Topics  . Alcohol use: Yes    Alcohol/week: 0.0 oz    Comment: rare  . Drug use: No    Comment: hx of marijuana and cocaine use     Allergies   Propoxyphene n-acetaminophen and Strawberry extract   Review of Systems Review  of Systems  Unable to perform ROS: Intubated     Physical Exam Updated Vital Signs BP (!) 216/120   Resp (!) 42   LMP 11/22/2015   SpO2 96%   Physical Exam  Constitutional:  Nasal tracheally intubated 57 year old female.  Intermittently opening her eyes and attempting to speak.  HENT:  Blood from the right nares.  Conjunctive are not pale.  Eyes:  6 mm reactive pupils.  Neck:  No JVD.  Thick neck.  Cardiovascular:  Sinus tachycardia 130.  Pulmonary/Chest:  Diffuse scattered rhonchi and crackles in the bilateral lungs.  Abdominal:  Soft benign abdomen.    Musculoskeletal:  Moves all 4 extremities.  Neurological:  Able to point thumbs up with both hands at request.  Skin:  Well perfused.  Pink warm dry.     ED Treatments / Results  Labs (all labs ordered are listed, but only abnormal results are displayed) Labs Reviewed  I-STAT CHEM 8, ED - Abnormal; Notable for the following components:      Result Value   Creatinine, Ser 1.10 (*)    Glucose, Bld 302 (*)    Calcium, Ion 1.08 (*)    Hemoglobin 17.7 (*)    HCT 52.0 (*)    All other components within normal limits  CBG MONITORING, ED - Abnormal; Notable for the following components:   Glucose-Capillary 296 (*)    All other components within normal limits  CBC WITH DIFFERENTIAL/PLATELET  BRAIN NATRIURETIC PEPTIDE  COMPREHENSIVE METABOLIC PANEL  PROTIME-INR  I-STAT TROPONIN, ED    EKG  EKG Interpretation  Date/Time:  Sunday February 15 2018 06:33:08 EST Ventricular Rate:  118 PR Interval:    QRS Duration: 95 QT Interval:  345 QTC Calculation: 484 R Axis:   143 Text Interpretation:  Sinus tachycardia Right axis deviation Consider left ventricular hypertrophy Anterior Q waves, possibly due to LVH Abnormal T, consider ischemia, diffuse leads Confirmed by Tanna Furry [phone]) on 02/15/2018 6:40:01 AM       Radiology No results found.  Procedures Procedures (including critical  care time)  Medications  Ordered in ED Medications  ondansetron (ZOFRAN) 4 MG/2ML injection (not administered)  iopamidol (ISOVUE-370) 76 % injection (not administered)  albuterol (PROVENTIL,VENTOLIN) solution continuous neb (10 mg/hr Nebulization Given 02/15/18 0641)  ipratropium (ATROVENT) nebulizer solution 0.5 mg (0.5 mg Nebulization Given 02/15/18 1761)     Initial Impression / Assessment and Plan / ED Course  I have reviewed the triage vital signs and the nursing notes.  Pertinent labs & imaging results that were available during my care of the patient were reviewed by me and considered in my medical decision making (see chart for details).   Patient was coughing with some sounds coming around her mouth.  There is several centimeters of her ET tube protruding from the nares.  In effect, I believe that her nasal tube is supraglottic.  This was removed.  Her nose and mouth were suctioned.  She is able to speak she is oriented.  She quickly desaturates but with mask O2 is able to maintain saturations.  She does not require immediate airway protection, reintubation, or mechanical ventilation.  After removal of her nasal tube she continues to have wheezing and prolongation.  Is given nebulized albuterol.  Chest x-ray pending.  I am highly suspicious of pulmonary embolus with collapse, tachycardia, and marked hypoxemia.  Flash pulmonary edema secondary to cocaine use is a concern.   EKG Interpretation  Date/Time:  Sunday February 15 2018 06:33:08 EST Ventricular Rate:  118 PR Interval:    QRS Duration: 95 QT Interval:  345 QTC Calculation: 484 R Axis:   143 Text Interpretation:  Sinus tachycardia Right axis deviation Consider left ventricular hypertrophy Anterior Q waves, possibly due to LVH Abnormal T, consider ischemia, diffuse leads Confirmed by Tanna Furry [phone]) on 02/15/2018 6:40:01 AM  EKG shows sinus rhythm.  Changes consistent with LVH.  Inverted T waves laterally.  No significant change versus  comparison.  IV access is obtained.  Continued on high flow O2 and nebulized albuterol.  Awaiting i-STAT Chem-8 to ensure kidney function prior to CT imaging.  Chest x-ray has appearance of CHF.  CTA obtained and pending.  BP 145/96 without treatment.  Remains nauseated.  Will give Lasix.  Await CT read.  Will need admitted for hypoxemia.  Final Clinical Impressions(s) / ED Diagnoses   Final diagnoses:  Syncope, unspecified syncope type  Respiratory distress    ED Discharge Orders    None       Tanna Furry, MD 02/18/18 2245

## 2018-02-16 ENCOUNTER — Observation Stay (HOSPITAL_BASED_OUTPATIENT_CLINIC_OR_DEPARTMENT_OTHER): Payer: Medicare Other

## 2018-02-16 ENCOUNTER — Other Ambulatory Visit: Payer: Self-pay

## 2018-02-16 DIAGNOSIS — D751 Secondary polycythemia: Secondary | ICD-10-CM

## 2018-02-16 DIAGNOSIS — I361 Nonrheumatic tricuspid (valve) insufficiency: Secondary | ICD-10-CM

## 2018-02-16 DIAGNOSIS — I10 Essential (primary) hypertension: Secondary | ICD-10-CM | POA: Diagnosis not present

## 2018-02-16 DIAGNOSIS — R4020 Unspecified coma: Secondary | ICD-10-CM

## 2018-02-16 DIAGNOSIS — I340 Nonrheumatic mitral (valve) insufficiency: Secondary | ICD-10-CM | POA: Diagnosis not present

## 2018-02-16 DIAGNOSIS — I739 Peripheral vascular disease, unspecified: Secondary | ICD-10-CM

## 2018-02-16 DIAGNOSIS — I110 Hypertensive heart disease with heart failure: Secondary | ICD-10-CM | POA: Diagnosis not present

## 2018-02-16 LAB — BASIC METABOLIC PANEL
Anion gap: 10 (ref 5–15)
BUN: 13 mg/dL (ref 6–20)
CALCIUM: 9.1 mg/dL (ref 8.9–10.3)
CO2: 25 mmol/L (ref 22–32)
CREATININE: 1.04 mg/dL — AB (ref 0.44–1.00)
Chloride: 106 mmol/L (ref 101–111)
GFR calc Af Amer: >60 mL/min (ref >60)
GFR calc non Af Amer: 59 mL/min — ABNORMAL LOW (ref >60)
Glucose, Bld: 110 mg/dL — ABNORMAL HIGH (ref 65–99)
Potassium: 4.2 mmol/L (ref 3.5–5.1)
Sodium: 141 mmol/L (ref 135–145)

## 2018-02-16 LAB — ECHOCARDIOGRAM COMPLETE
HEIGHTINCHES: 64.5 in
WEIGHTICAEL: 3448 [oz_av]

## 2018-02-16 MED ORDER — CARVEDILOL 12.5 MG PO TABS: 12.5000 mg | ORAL_TABLET | Freq: Every day | ORAL | Status: DC

## 2018-02-16 MED ORDER — HYDROCHLOROTHIAZIDE 25 MG PO TABS
25.0000 mg | ORAL_TABLET | Freq: Every day | ORAL | Status: DC
  Administered 2018-02-16: 25 mg via ORAL
  Filled 2018-02-16 (×2): qty 1

## 2018-02-16 MED ORDER — ASPIRIN EC 81 MG PO TBEC
81.0000 mg | DELAYED_RELEASE_TABLET | Freq: Every day | ORAL | Status: DC
  Administered 2018-02-16 – 2018-02-17 (×2): 81 mg via ORAL
  Filled 2018-02-16 (×2): qty 1

## 2018-02-16 MED ORDER — PHENOL 1.4 % MT LIQD
1.0000 | OROMUCOSAL | Status: DC | PRN
  Administered 2018-02-16: 1 via OROMUCOSAL
  Filled 2018-02-16: qty 177

## 2018-02-16 MED ORDER — ATORVASTATIN CALCIUM 40 MG PO TABS
40.0000 mg | ORAL_TABLET | Freq: Every day | ORAL | Status: DC
  Administered 2018-02-16: 40 mg via ORAL
  Filled 2018-02-16: qty 1

## 2018-02-16 MED ORDER — AMLODIPINE BESYLATE 2.5 MG PO TABS
2.5000 mg | ORAL_TABLET | Freq: Every day | ORAL | Status: DC
  Administered 2018-02-16: 2.5 mg via ORAL
  Filled 2018-02-16 (×2): qty 1

## 2018-02-16 MED ORDER — LISINOPRIL 10 MG PO TABS
10.0000 mg | ORAL_TABLET | Freq: Every day | ORAL | Status: DC
  Administered 2018-02-17: 10 mg via ORAL
  Filled 2018-02-16: qty 1

## 2018-02-16 MED ORDER — AMLODIPINE BESYLATE 2.5 MG PO TABS
2.5000 mg | ORAL_TABLET | Freq: Every day | ORAL | Status: DC
  Administered 2018-02-17: 2.5 mg via ORAL
  Filled 2018-02-16: qty 1

## 2018-02-16 MED ORDER — HYDRALAZINE HCL 20 MG/ML IJ SOLN
5.0000 mg | Freq: Four times a day (QID) | INTRAMUSCULAR | Status: DC | PRN
Start: 2018-02-16 — End: 2018-02-17
  Administered 2018-02-16: 5 mg via INTRAVENOUS
  Filled 2018-02-16: qty 1

## 2018-02-16 MED ORDER — AMLODIPINE BESYLATE 2.5 MG PO TABS: 2.5000 mg | ORAL_TABLET | Freq: Every day | ORAL | Status: DC

## 2018-02-16 MED ORDER — CARVEDILOL 12.5 MG PO TABS
12.5000 mg | ORAL_TABLET | Freq: Every day | ORAL | Status: DC
  Administered 2018-02-16: 12.5 mg via ORAL
  Filled 2018-02-16: qty 1

## 2018-02-16 NOTE — Discharge Instructions (Signed)
Implantable Loop Recorder Placement, Care After  Refer to this sheet in the next few weeks. These instructions provide you with information about caring for yourself after your procedure. Your health care provider may also give you more specific instructions. Your treatment has been planned according to current medical practices, but problems sometimes occur. Call your health care provider if you have any problems or questions after your procedure.  What can I expect after the procedure?  After the procedure, it is common to have:   Soreness or pain near the cut from surgery (incision).   Some swelling or bruising near the incision.    Follow these instructions at home:  Medicines   Take over-the-counter and prescription medicines only as told by your health care provider.   If you were prescribed an antibiotic medicine, take it as told by your health care provider. Do not stop taking the antibiotic even if you start to feel better.  Bathing   Do not take baths, swim, or use a hot tub until your health care provider approves. Ask your health care provider if you can take showers. You may only be allowed to take sponge baths for bathing.  Incision care   Follow instructions from your health care provider about how to take care of your incision. Make sure you:  ? Wash your hands with soap and water before you change your bandage (dressing). If soap and water are not available, use hand sanitizer.  ? Change your dressing as told by your health care provider.  ? Keep your dressing dry.  ? Leave stitches (sutures), skin glue, or adhesive strips in place. These skin closures may need to stay in place for 2 weeks or longer. If adhesive strip edges start to loosen and curl up, you may trim the loose edges. Do not remove adhesive strips completely unless your health care provider tells you to do that.   Check your incision area every day for signs of infection. Check for:  ? More redness, swelling, or pain.  ? Fluid  or blood.  ? Warmth.  ? Pus or a bad smell.  Driving   If you received a sedative, do not drive for 24 hours after the procedure.   If you did not receive a sedative, ask your health care provider when it is safe to drive.  Activity   Return to your normal activities as told by your health care provider. Ask your health care provider what activities are safe for you.   Until your health care provider says it is safe:  ? Do not lift anything that is heavier than 10 lb (4.5 kg).  ? Do not do activities that involve lifting your arms over your head.  General instructions     Follow instructions from your health care provider about how and when to use your implantable loop recorder.   Do not go through a metal detection gate, and do not let someone hold a metal detector over your chest. Show your ID card.   Do not have an MRI unless you check with your health care provider first.   Do not use any tobacco products, such as cigarettes, chewing tobacco, and e-cigarettes. Tobacco can delay healing. If you need help quitting, ask your health care provider.   Keep all follow-up visits as told by your health care provider. This is important.  Contact a health care provider if:   You have more redness, swelling, or pain around your incision.     You have more fluid or blood coming from your incision.   Your incision feels warm to the touch.   You have pus or a bad smell coming from your incision.   You have a fever.   You have pain that is not relieved by your pain medicine.   You have triggered your device because of fainting (syncope) or because of a heartbeat that feels like it is racing, slow, fluttering, or skipping (palpitations).  Get help right away if:   You have chest pain.   You have difficulty breathing.  This information is not intended to replace advice given to you by your health care provider. Make sure you discuss any questions you have with your health care provider.  Document Released:  11/27/2015 Document Revised: 05/23/2016 Document Reviewed: 09/20/2015  Elsevier Interactive Patient Education  2018 [employer]

## 2018-02-16 NOTE — Progress Notes (Signed)
  Echocardiogram 2D Echocardiogram has been performed.  Matilde Bash 02/16/2018, 12:21 PM

## 2018-02-16 NOTE — Care Management Note (Signed)
Case Management Note  Patient Details  Name: Alisha [name] MRN: [id number] Date of Birth: 20-Jul-1961  Subjective/Objective:   Loss of Consciousness                Action/Plan: Patient lives at home with her daughter and she takes her to her apts; No PCP; has private insurance with Medicare / Medicaid with prescription drug coverage; pharmacy of choice is [facility]; patient is agreeable to have the CM assist her in finding a new PCP; CM called Dr Seward Carol with [facility], they cannot accept the patient.  Primary Care at [facility] called; apt made with Dr Grier Mitts for February 27, 2018 at 3:30 pm. DME- pt has a cane, wheelchair, walker and potty chair at home; no home oxygen at this time; CM will continue to follow for progression of care.  Expected Discharge Date:  02/18/18               Expected Discharge Plan:  [place]  Discharge planning Services  CM Consult  Status of Service:  In process, will continue to follow  Royston Bake, RN 02/16/2018, 10:32 AM

## 2018-02-16 NOTE — Progress Notes (Signed)
   Subjective: Pt admitted yesterday. No further episodes of syncope or unresponsiveness since admission. Her BP trended up through yesterday afternoon and evening, received IV hydralazine doses and several prior home BP meds restarted which improved her BP, last recorded at 149/70. This morning she reports her breathing is doing much better. No events noted on telemetry   Objective:  Vital signs in last 24 hours: Vitals:   02/16/18 0257 02/16/18 0320 02/16/18 0608 02/16/18 0749  BP: (!) 195/103 (!) 214/110 (!) 149/90 (!) 146/92  Pulse: (!) 103  87 88  Resp: 18  18 18   Temp:   98.4 F (36.9 C) 98.4 F (36.9 C)  TempSrc:   Oral Oral  SpO2: 93%  93% 92%  Weight:   215 lb 8 oz (97.8 kg)   Height:       General: Resting in bed comfortably, on [place], no acute distress HEENT: Moist mucus membranes, asymmetry near mandible over parotid gland with L > R. Similar asymmetry of thyroid with no discrete nodule palpated, slight ttp of thyroid  CV: Regular rate and regular rhythm, no murmur appreciated Resp: Clear breath sounds bilaterally, normal work of breathing, no distress  Abd: Soft, +BS, non-tender to palpation Extr: No LE edema, mild tenderness over R proximal shin with no ecchymosis  Neuro: Alert and oriented x3  Skin: Warm, dry      Assessment/Plan:  Loss of Consciousness, Dyspnea Presented with dyspnea and an unclear story of LOC that may have been syncope. Her troponin was only mildly elevated at 0.04 and stable, unlikely to have been ACS or cardiac related. She has uncontrolled HTN and this may have been related to pulmonary edema, though she did not have significant edema on imaging. CT chest does note potential aspiration, ruled out PE. UDS negative for cocaine use. No cardiac events on telemetry review. While there is not currently a unifying diagnosis, she has improved and remains stable.  --Monitor vital signs, DC tele --F/u echo --Wean supplemental O2 as able    H/o  HTN Previously on a regimen including Coreg, Lisinopril, HCTZ, amlodipine. After being hypertensive on arrival (in setting of Epi administration), her BP normalized without intervention. She trended back up to systolics >389 overnight. Coreg may not be an ideal option given her cocaine use and she does not currently have a HF or CAD history on chart review. Will restart amlodipine, lisinopril, and HCTZ based on prior doses which can be uptitrated as an outpatient. --Re-start Lisinopril 10 mg, Amlodipine 2.5 mg, HCTZ 25 mg --CM for establishing a PCP for f/u    Substance Use Intermittently uses cocaine, last ~4 days prior to admission, UDS negative for substances here. Counseled on benefits of abstinence from drug use.     Facial Asymmetry On exam this morning pt noted to have asymmetric fullness over L parotid gland compared to right, no tenderness, as well as potential thyroid asymmetry. She does have a degree of proptosis. Further workup and labs may be pursued as an outpatient to identify any potential underlying abnormalities.      Dispo: Anticipated discharge in approximately 1-2 day(s).   Tawny Asal, MD 02/16/2018, 9:21 AM Pager: [phone]

## 2018-02-16 NOTE — Progress Notes (Addendum)
Date: 02/16/2018  Patient name: [place] record number: [id number]  Date of birth: 01/22/1961   I have seen and evaluated Alisha [name] and discussed their care with the Residency Team. Ms Credeur is a 57 yo female with HTN, DVT, PVD s/p LLE bypass, NAFLD, and depression. On the night of admission, she had polyurian without dysuria and upon returning to bed, had sudden onset of dyspnea, chest heaviness, and nausea. Her daughters came to her room and subsequently noticed that she became unresponsive. They performed CPR and called EMS. EMS did not document any asystole. She received epinephrine, Narcan, and albuterol. She had a CT scan of the chest due to her history of DVT but her CT scan was negative for PE or any other abnormalities. Her chest x-ray was concerning for pulmonary edema, her blood pressure was quite high and flash pulmonary edema with the consideration so the patient was given 40 of Lasix orally and she diuresed net 1.7 L. Her echo is still pending.  PMHx, Fam Hx, and/or Soc Hx : Has four daughters and relies on them or a cab to get to a doctor's appt. Dismissed from [facility] late 2017 2/2 no shows. Has not est with new PCP. Goes to mental health. Came to her room   Vitals:   02/16/18 0608 02/16/18 0749  BP: (!) 149/90 (!) 146/92  Pulse: 87 88  Resp: 18 18  Temp: 98.4 F (36.9 C) 98.4 F (36.9 C)  SpO2: 93% 92%   NAD, lying in bed, able to speak in full sentences HEENT : injected sclera, proptosis Neck : enlargement, non tender at  Mandibular angle / just inf to earlobe HRRR no MRG LCTAB, good air flow ABD protuberant, + BS Ext no edema  Trop 0.04 x 2 HgB 15.5 - 16.8 - 17.7 Hep B S Ab negative 2011 ED UDS negative  I personally viewed the CXR images and confirmed my reading with the official read. Portable, rotated, suggestive of pul edema, mild  I personally viewed the EKG and confirmed my reading with the official read. Sinus, RAD, lateral TWI, prolonged  QTc  Assessment and Plan: I have seen and evaluated the patient as outlined above. I agree with the formulated Assessment and Plan as detailed in the residents' note, with the following changes: Ms Kleinman is a 57 year old woman with known hypertension and peripheral vascular disease who came with sudden onset of dyspnea, chest heaviness, and unresponsiveness. Her workup so far has not revealed an etiology but a PE & AMI have been ruled out. Telemetry has shown no arrhythmia. There is a concern for pulmonary edema as she was quite hypertensive on admission and had used cocaine 3 days ago. She responded well to one dose of oral Lasix and her echo is pending.  1. Symptom complex of unresponsiveness, dyspnea, and chest heaviness - await echo results. Continue to monitor telemetry. Continue to monitor exam findings.  2. Malignant hypertension - she did get epinephrine in the ED that has also been out of her medications since she was dismissed from the [facility] in 2017. At her last clinic visit in October 2017, her blood pressure was elevated at 195/121. Her regimen at that time included amlodipine 2.5, Coreg 25, HCTZ 25 and lisinopril 20. Continue to add and titrate antihypertensives for blood pressure goal of 120/80.  3. Peripheral arterial disease - we need to resume a statin and an aspirin. She also needs education on tobacco cessation.  4. NAFLD -  this was seen once on ABD CT 2016 but not on the other 2016 CTs. Her 2011 Hep B S Ab was negative. She will need to get est with a PCP, consider repeat ABD imaging, and Hep B vaccination if in fact does have NAFLD.   5. Polycythemia - she will need a JAK and epo level as an outpt to differ btw primary and secondary.  6. Lack of PCP - we have consulted care management to assist in getting her established PCP   Bartholomew Crews, MD 2/18/20199:10 AM

## 2018-02-16 NOTE — Consult Note (Signed)
   [facility] Inpatient Consult   02/16/2018  Alisha [name] July 26, 1961 [id number]  Patient listed in the Medicare ACO.  Met with patient at the bedside patient was resting.  Patient states she is no longer with a primary care provider.  Inpatient RNCM states she will have a follow up with a new primary care provider. Patient with multiple ED visits. Will follow for progress.  Natividad Brood, RN BSN [facility] Liaison  [phone] business mobile phone Toll free office [phone]

## 2018-02-16 NOTE — Progress Notes (Signed)
Pt. With elevated BP. On call for IMTS paged to make aware. New orders received.

## 2018-02-17 DIAGNOSIS — I110 Hypertensive heart disease with heart failure: Secondary | ICD-10-CM | POA: Diagnosis not present

## 2018-02-17 MED ORDER — CARVEDILOL 12.5 MG PO TABS
12.5000 mg | ORAL_TABLET | Freq: Two times a day (BID) | ORAL | Status: DC
  Administered 2018-02-17: 12.5 mg via ORAL
  Filled 2018-02-17: qty 1

## 2018-02-17 MED ORDER — HYDROCHLOROTHIAZIDE 25 MG PO TABS: 25.0000 mg | ORAL_TABLET | Freq: Every day | ORAL | 1 refills | Status: DC

## 2018-02-17 MED ORDER — ALBUTEROL SULFATE HFA 108 (90 BASE) MCG/ACT IN AERS: 1.0000 | INHALATION_SPRAY | RESPIRATORY_TRACT | 3 refills | Status: DC | PRN

## 2018-02-17 MED ORDER — BUSPIRONE HCL 10 MG PO TABS: 10.0000 mg | ORAL_TABLET | Freq: Three times a day (TID) | ORAL | Status: DC

## 2018-02-17 MED ORDER — CARVEDILOL 12.5 MG PO TABS: 12.5000 mg | ORAL_TABLET | Freq: Two times a day (BID) | ORAL | 1 refills | Status: DC

## 2018-02-17 MED ORDER — CARVEDILOL 12.5 MG PO TABS: 12.5000 mg | ORAL_TABLET | Freq: Two times a day (BID) | ORAL | Status: DC

## 2018-02-17 MED ORDER — LISINOPRIL 20 MG PO TABS: 20.0000 mg | ORAL_TABLET | Freq: Every day | ORAL | 1 refills | Status: DC

## 2018-02-17 MED ORDER — ATORVASTATIN CALCIUM 40 MG PO TABS: 40.0000 mg | ORAL_TABLET | Freq: Every day | ORAL | 3 refills | Status: AC

## 2018-02-17 MED ORDER — AMLODIPINE BESYLATE 5 MG PO TABS: 2.5000 mg | ORAL_TABLET | Freq: Every day | ORAL | 0 refills | Status: DC

## 2018-02-17 NOTE — Discharge Summary (Signed)
Name: Alisha [name] MRN: [id number] DOB: [birth year] 57 y.o. PCP: Patient, No Pcp Per  Date of Admission: 02/15/2018  6:28 AM Date of Discharge:  Attending Physician: Bartholomew Crews, MD  Discharge Diagnosis: Principal Problem:   Loss of consciousness ([place]) New Onset HFrEF   Discharge Medications: Allergies as of 02/17/2018      Reactions   Propoxyphene N-acetaminophen Swelling   Grapefruit Extract    Interaction with medications she's taking   Strawberry Extract    Electric shock       Medication List    STOP taking these medications   diclofenac sodium 1 % Gel Commonly known as:  VOLTAREN   dicyclomine 20 MG tablet Commonly known as:  BENTYL   fluticasone 50 MCG/ACT nasal spray Commonly known as:  FLONASE   gabapentin 800 MG tablet Commonly known as:  NEURONTIN   omeprazole 20 MG capsule Commonly known as:  PRILOSEC   potassium chloride 10 MEQ tablet Commonly known as:  K-DUR   warfarin 7.5 MG tablet Commonly known as:  COUMADIN     TAKE these medications   albuterol 108 (90 Base) MCG/ACT inhaler Commonly known as:  PROVENTIL HFA;VENTOLIN HFA Inhale 1-2 puffs into the lungs every 4 (four) hours as needed for wheezing or shortness of breath.   amLODipine 5 MG tablet Commonly known as:  NORVASC Take 0.5 tablets (2.5 mg total) by mouth daily. What changed:  medication strength   aspirin EC 81 MG tablet Take 81 mg by mouth daily.   atorvastatin 40 MG tablet Commonly known as:  LIPITOR Take 1 tablet (40 mg total) by mouth at bedtime.   busPIRone 10 MG tablet Commonly known as:  BUSPAR Take 1 tablet (10 mg total) by mouth 3 (three) times daily. What changed:    medication strength  when to take this   carvedilol 12.5 MG tablet Commonly known as:  COREG Take 1 tablet (12.5 mg total) by mouth 2 (two) times daily with a meal. What changed:    medication strength  how much to take   diazepam 5 MG tablet Commonly known as:  VALIUM Take  5 mg by mouth 3 (three) times daily.   DULoxetine 60 MG capsule Commonly known as:  CYMBALTA Take 60 mg by mouth daily.   GEODON 60 MG capsule Generic drug:  ziprasidone Take 60 mg by mouth 2 (two) times daily with a meal.   hydrochlorothiazide 25 MG tablet Commonly known as:  HYDRODIURIL Take 1 tablet (25 mg total) by mouth daily.   hydrOXYzine 25 MG capsule Commonly known as:  VISTARIL Take 25 mg by mouth 3 (three) times daily as needed for anxiety.   lisinopril 20 MG tablet Commonly known as:  PRINIVIL,ZESTRIL Take 1 tablet (20 mg total) by mouth daily. What changed:  when to take this   lurasidone 80 MG Tabs tablet Commonly known as:  LATUDA Take 80 mg by mouth daily with breakfast. Take 1/2 tablet (20mg ) once daily   traZODone 50 MG tablet Commonly known as:  DESYREL Take 50 mg by mouth at bedtime.       Disposition and follow-up:   Alisha [name] was discharged from [facility] in Stable condition.  At the hospital follow up visit please address:  1.  --Encourage continued adherence to medication regimen, abstinence from cocaine, and attendance to follow up appointments --Assess BP and up-titrate medications as needed  --Examine parotids/thyroids and pursue further workup as indicated   2.  Labs / imaging needed at time of follow-up: BMP after resumption of ACE   3.  Pending labs/ test needing follow-up: None   Follow-up Appointments: Follow-up Information    Billie Ruddy, MD Follow up on 02/27/2018.   Specialty:  Family Medicine Why:  at 3:30 pm; please try to keep your apt or call to reschedule Contact information: [place] [id number] [phone]           Hospital Course by problem list:   Loss of Consciousness, Dyspnea Presented with dyspnea and chest discomfort and an unclear story of LOC that may have been a syncopal episode. EMS reports were reassuring that it was not a cardiac arrest event. She  was nasally intubated by EMS for reports of agonal breathing, subsequently extubated in the ED and then weaned to room air without difficulty. Troponin was only mildley elevated 0.04 and stable, unlikely to have been ACS. She has uncontrolled HTN, was discovered to have HF--this may have been related to pulmonary edema though she did not have significant edema on imaging. She did receive a dose of oral Lasix. CTA chest ruled out PE-did note potential aspirate material though pt never developed recurrent dyspnea or fever. She was monitored with telemetry which did not reveal any cardiac events. While it is difficult to confidently explain this episode, she quickly improved and remained stable.   New Onset HFrEF An echo was obtained during evaluation which noted a decreased EF of 35-40% with global hypokinesis and grade 1 diastolic dysfunction. She did not have a prior echo within our system, but did have a cath performed in 2015 which showed only 20% disease in LAD, other coronaries normal and EF noted to be normal at that time. CTA chest did note evidence of calcifications in 3 vessels which may represent progression. She also has a history of uncontrolled HTN and cocaine use as risk factors for cardiomyopathy. Her HTN regimen included several evidence based HF therapies and were continued. She was not complaining of significant sx prior to her presentation, appeared euvolemic on exam so diuretics/spironolactone not initiated. Follow up was made with Cardiology for consideration of further evaluation as an outpatient.   H/o HTN  She had not been taking her anti-hypertensives for about 1 year due to not having a PCP or means for refills. Her prior regimen included Coreg, Lisinopril, HCTZ, and amlodipine with atypical doses (lisinopril twice a day, coreg daily) per her report. She was hypertensive on arrival in setting of Epi administration by EMS, and her BP normalized without intervention. However, her BP  trended back up over the course of admission and prior home medications were re-introduced. Given her new HF, it was decided to start Coreg despite her cocaine use history. She was counseled on the importance of abstinence from cocaine. The above regimen and doses can be altered as indicated on PCP follow up.   H/o Substance Use Long history of cocaine use, she reports lately her use has been intermittent with last use around 2/7. UDS on admission was negative. Counseled on cessation and its importance in setting of beta blocker use.   Facial Asymmetry  On morning rounds, pt noted to have asymmetric fullness over L parotid gland compared to right, no tenderness. Thyroid possible also asymmetric. She does have a degree of proptosis but unsure of baseline appearance. Further workup and labs may be pursued as an outpatient to identify any potential underlying abnormalities.   ? 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 She reports regular follow up and is on an extensive psych regimen. These were continued and further management deferred to her psychiatry provider.   H/o DVTs  History of bilateral DVTs, though chart review has limited details. At some point, she is noted to need lifelong anti-coagulation. However, she has not been taking warfarin for the past year. There was no evidence of PE on CTA, no LE edema, redness, or pain. Warfarin was not resumed. With clarification of history and pt demonstrating reliable follow up, anti-coagulation decision can be re-visited in the future.   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 Will need follow up with repeat CTA or MRA in one year per radiology guidelines.    Discharge Vitals:   BP (!) 169/95 (BP Location: Right Arm)   Pulse 95   Temp 98.3 F (36.8 C) (Oral)   Resp 19   Ht  5' 4.5" (1.638 m)   Wt 214 lb 3.2 oz (97.2 kg)   LMP 11/22/2015   SpO2 99%   BMI 36.20 kg/m   Pertinent Labs, Studies, and Procedures:  Echo: Left ventricle: The cavity size was normal. There was moderate concentric hypertrophy. Systolic function was moderately reduced. The estimated ejection fraction was in the range of 35% to 40%. Diffuse hypokinesis. Doppler parameters are consistent with abnormal left ventricular relaxation (grade 1 diastolic dysfunction). Doppler parameters are consistent with elevated ventricular end-diastolic filling pressure.  Discharge Instructions: Discharge Instructions    Diet - low sodium heart healthy   Complete by:  As directed    Discharge instructions   Complete by:  As directed    Nice to meet you Ms. Tucci --The echo showed that your heart was not pumping as strong as usual. Like we talked about, your medicines will help your heart and blood pressure to try to help improve your heart function and keep you healthy.  --We sent refills for your blood pressure and heart medicines to the pharmacy. It will be important to take these as prescribed. Your new primary care doctor may increase or adjust the doses depending on your blood pressure --It will be very important to avoid cocaine from now on. It will be better for your health and it can also be dangerous while you are taking the Coreg  --You have an appointment with your new primary care doctor March 1st at 3:30 pm  --We also called the heart doctors to get you an appointment in their clinic. This will be March 12th at 9 am. The clinic is called [facility] and they should call to confirm. If you don't hear from them you can also call them at [phone]  --Continue taking your mental health medicines as instructed by [facility] and follow up with them regularly   Increase activity slowly   Complete by:  As directed       Signed: Tawny Asal, MD 02/17/2018, 10:38 AM   Pager: [phone]

## 2018-02-17 NOTE — Progress Notes (Signed)
   Subjective: No acute events overnight, pt reports continuing to feel improved compared to presentation, able to walk around room without difficulty. She was counseled on the results of her echocardiogram and importance of adherence to a medication regimen, cessation of drug use, and regular follow up to optimize her cardiac function and overall health. She was emotional and surprised but appeared motivated toward her health going forward.    Objective:  Vital signs in last 24 hours: Vitals:   02/16/18 1651 02/16/18 1854 02/16/18 2058 02/17/18 0440  BP: (!) 169/112 (!) 171/98 (!) 160/92 (!) 172/98  Pulse: 95  96 94  Resp: 16  18 18   Temp: 98.6 F (37 C)  98.8 F (37.1 C) 98.3 F (36.8 C)  TempSrc: Oral  Oral Oral  SpO2:   100% 98%  Weight:    214 lb 3.2 oz (97.2 kg)  Height:       General: Sitting up in bed comfortably, no acute distress HEENT: Moist mucus membranes CV: Regular rate and regular rhythm, no murmur appreciated Resp: Clear breath sounds bilaterally, normal work of breathing, no distress  Abd: Soft, +BS, non-tender to palpation Extr: No LE edema Neuro: Alert and oriented x3  Skin: Warm, dry      Assessment/Plan:  HFrEF On echo, pt found to have EF 35-40% with grade 1 diastolic dysfunction, moderate hypertrophy. She does not have a prior echo within our system, but did have a cath performed in 2015 which showed only 20% disease in LAD, otherwise normal, EF noted be to normal at that time. Her prior medication regimen includes many evidence based HF agents which will be continued. Apart from acute episode below, does not complain of prior sx of edema, dyspnea and is currently euvolumic. Will attempt to arrange outpatient follow up with cardiology.  --Re-start Coreg 12.5 mg BID --Cont other components of HTN regimen --Outpatient follow up   Loss of Consciousness, Dyspnea Presented with dyspnea and an unclear story of LOC that may have been syncope. Her troponin was  only mildly elevated at 0.04 and stable, unlikely to have been ACS or cardiac related. She has uncontrolled HTN and this may have been related to pulmonary edema, though she did not have significant edema on imaging. CT chest does note potential aspiration, ruled out PE. UDS negative for cocaine use. No cardiac events on telemetry review. While there is not currently a unifying diagnosis, she has improved and remains stable on room air without dyspnea.  --Monitor vital signs  H/o HTN Previously on a regimen including Coreg, Lisinopril, HCTZ, amlodipine. After being hypertensive on arrival (in setting of Epi administration), her BP normalized without intervention. BP has trended back up over the course of the admission and home amlodipine, lisinopril, and HCTZ have been restarted. These will likely need to be uptitrated as an outpatient. Will also re-start Coreg given her new HF diagnosis, was counseled to avoid cocaine.  --Cont Lisinopril 10 mg, Amlodipine 2.5 mg, HCTZ 25 mg --Coreg 12.5 mg BID        Dispo: Anticipated discharge in approximately 0-1 day(s).   Tawny Asal, MD 02/17/2018, 7:12 AM Pager: [phone]

## 2018-02-17 NOTE — Progress Notes (Signed)
Reviewed discharge instructions/medications with patient. Answered their questions. Patient's BP has lowered and she feels better. Pt is stable and ready for discharge.

## 2018-02-18 ENCOUNTER — Telehealth: Payer: Self-pay | Admitting: *Deleted

## 2018-02-18 MED ORDER — ALBUTEROL SULFATE HFA 108 (90 BASE) MCG/ACT IN AERS: 1.0000 | INHALATION_SPRAY | Freq: Four times a day (QID) | RESPIRATORY_TRACT | 2 refills | Status: AC | PRN

## 2018-02-18 NOTE — Telephone Encounter (Signed)
Received fax from pt's pharmacy with the following message regarding proventil hfa inhaler : Drug not covered by patient plan.  The preferred alternative is Ventolin HFA Aer, Xopenex HFA Aer.  Pt no longer an [facility] patient, but was recently seen/discharged from [facility] service.  Will forward request to prescribing MD for review, please advise.Regenia Skeeter, Lateshia Schmoker Cassady2/20/201911:30 AM

## 2018-02-27 ENCOUNTER — Encounter: Payer: Self-pay | Admitting: Family Medicine

## 2018-02-27 ENCOUNTER — Ambulatory Visit (INDEPENDENT_AMBULATORY_CARE_PROVIDER_SITE_OTHER): Payer: Medicare Other | Admitting: Family Medicine

## 2018-02-27 VITALS — BP 150/106 | HR 86 | Temp 97.6°F | Ht 64.0 in | Wt 214.0 lb

## 2018-02-27 DIAGNOSIS — Z7689 Persons encountering health services in other specified circumstances: Secondary | ICD-10-CM

## 2018-02-27 DIAGNOSIS — I10 Essential (primary) hypertension: Secondary | ICD-10-CM | POA: Diagnosis not present

## 2018-02-27 DIAGNOSIS — G894 Chronic pain syndrome: Secondary | ICD-10-CM | POA: Diagnosis not present

## 2018-02-27 DIAGNOSIS — Z7901 Long term (current) use of anticoagulants: Secondary | ICD-10-CM

## 2018-02-27 DIAGNOSIS — F1910 Other psychoactive substance abuse, uncomplicated: Secondary | ICD-10-CM | POA: Diagnosis not present

## 2018-02-27 NOTE — Patient Instructions (Addendum)
DASH Eating Plan DASH stands for "Dietary Approaches to Stop Hypertension." The DASH eating plan is a healthy eating plan that has been shown to reduce high blood pressure (hypertension). It may also reduce your risk for type 2 diabetes, heart disease, and stroke. The DASH eating plan may also help with weight loss. What are tips for following this plan? General guidelines  Avoid eating more than 2,300 mg (milligrams) of salt (sodium) a day. If you have hypertension, you may need to reduce your sodium intake to 1,500 mg a day.  Limit alcohol intake to no more than 1 drink a day for nonpregnant women and 2 drinks a day for men. One drink equals 12 oz of beer, 5 oz of wine, or 1 oz of hard liquor.  Work with your health care provider to maintain a healthy body weight or to lose weight. Ask what an ideal weight is for you.  Get at least 30 minutes of exercise that causes your heart to beat faster (aerobic exercise) most days of the week. Activities may include walking, swimming, or biking.  Work with your health care provider or diet and nutrition specialist (dietitian) to adjust your eating plan to your individual calorie needs. Reading food labels  Check food labels for the amount of sodium per serving. Choose foods with less than 5 percent of the Daily Value of sodium. Generally, foods with less than 300 mg of sodium per serving fit into this eating plan.  To find whole grains, look for the word "whole" as the first word in the ingredient list. Shopping  Buy products labeled as "low-sodium" or "no salt added."  Buy fresh foods. Avoid canned foods and premade or frozen meals. Cooking  Avoid adding salt when cooking. Use salt-free seasonings or herbs instead of table salt or sea salt. Check with your health care provider or pharmacist before using salt substitutes.  Do not fry foods. Cook foods using healthy methods such as baking, boiling, grilling, and broiling instead.  Cook with  heart-healthy oils, such as olive, canola, soybean, or sunflower oil. Meal planning   Eat a balanced diet that includes: ? 5 or more servings of fruits and vegetables each day. At each meal, try to fill half of your plate with fruits and vegetables. ? Up to 6-8 servings of whole grains each day. ? Less than 6 oz of lean meat, poultry, or fish each day. A 3-oz serving of meat is about the same size as a deck of cards. One egg equals 1 oz. ? 2 servings of low-fat dairy each day. ? A serving of nuts, seeds, or beans 5 times each week. ? Heart-healthy fats. Healthy fats called Omega-3 fatty acids are found in foods such as flaxseeds and coldwater fish, like sardines, salmon, and mackerel.  Limit how much you eat of the following: ? Canned or prepackaged foods. ? Food that is high in trans fat, such as fried foods. ? Food that is high in saturated fat, such as fatty meat. ? Sweets, desserts, sugary drinks, and other foods with added sugar. ? Full-fat dairy products.  Do not salt foods before eating.  Try to eat at least 2 vegetarian meals each week.  Eat more home-cooked food and less restaurant, buffet, and fast food.  When eating at a restaurant, ask that your food be prepared with less salt or no salt, if possible. What foods are recommended? The items listed may not be a complete list. Talk with your dietitian about what   dietary choices are best for you. Grains Whole-grain or whole-wheat bread. Whole-grain or whole-wheat pasta. Brown rice. Oatmeal. Quinoa. Bulgur. Whole-grain and low-sodium cereals. Pita bread. Low-fat, low-sodium crackers. Whole-wheat flour tortillas. Vegetables Fresh or frozen vegetables (raw, steamed, roasted, or grilled). Low-sodium or reduced-sodium tomato and vegetable juice. Low-sodium or reduced-sodium tomato sauce and tomato paste. Low-sodium or reduced-sodium canned vegetables. Fruits All fresh, dried, or frozen fruit. Canned fruit in natural juice (without  added sugar). Meat and other protein foods Skinless chicken or turkey. Ground chicken or turkey. Pork with fat trimmed off. Fish and seafood. Egg whites. Dried beans, peas, or lentils. Unsalted nuts, nut butters, and seeds. Unsalted canned beans. Lean cuts of beef with fat trimmed off. Low-sodium, lean deli meat. Dairy Low-fat (1%) or fat-free (skim) milk. Fat-free, low-fat, or reduced-fat cheeses. Nonfat, low-sodium ricotta or cottage cheese. Low-fat or nonfat yogurt. Low-fat, low-sodium cheese. Fats and oils Soft margarine without trans fats. Vegetable oil. Low-fat, reduced-fat, or light mayonnaise and salad dressings (reduced-sodium). Canola, safflower, olive, soybean, and sunflower oils. Avocado. Seasoning and other foods Herbs. Spices. Seasoning mixes without salt. Unsalted popcorn and pretzels. Fat-free sweets. What foods are not recommended? The items listed may not be a complete list. Talk with your dietitian about what dietary choices are best for you. Grains Baked goods made with fat, such as croissants, muffins, or some breads. Dry pasta or rice meal packs. Vegetables Creamed or fried vegetables. Vegetables in a cheese sauce. Regular canned vegetables (not low-sodium or reduced-sodium). Regular canned tomato sauce and paste (not low-sodium or reduced-sodium). Regular tomato and vegetable juice (not low-sodium or reduced-sodium). Pickles. Olives. Fruits Canned fruit in a light or heavy syrup. Fried fruit. Fruit in cream or butter sauce. Meat and other protein foods Fatty cuts of meat. Ribs. Fried meat. Bacon. Sausage. Bologna and other processed lunch meats. Salami. Fatback. Hotdogs. Bratwurst. Salted nuts and seeds. Canned beans with added salt. Canned or smoked fish. Whole eggs or egg yolks. Chicken or turkey with skin. Dairy Whole or 2% milk, cream, and half-and-half. Whole or full-fat cream cheese. Whole-fat or sweetened yogurt. Full-fat cheese. Nondairy creamers. Whipped toppings.  Processed cheese and cheese spreads. Fats and oils Butter. Stick margarine. Lard. Shortening. Ghee. Bacon fat. Tropical oils, such as coconut, palm kernel, or palm oil. Seasoning and other foods Salted popcorn and pretzels. Onion salt, garlic salt, seasoned salt, table salt, and sea salt. Worcestershire sauce. Tartar sauce. Barbecue sauce. Teriyaki sauce. Soy sauce, including reduced-sodium. Steak sauce. Canned and packaged gravies. Fish sauce. Oyster sauce. Cocktail sauce. Horseradish that you find on the shelf. Ketchup. Mustard. Meat flavorings and tenderizers. Bouillon cubes. Hot sauce and Tabasco sauce. Premade or packaged marinades. Premade or packaged taco seasonings. Relishes. Regular salad dressings. Where to find more information:  National Heart, Lung, and Blood Institute: www.nhlbi.nih.gov  American Heart Association: www.heart.org Summary  The DASH eating plan is a healthy eating plan that has been shown to reduce high blood pressure (hypertension). It may also reduce your risk for type 2 diabetes, heart disease, and stroke.  With the DASH eating plan, you should limit salt (sodium) intake to 2,300 mg a day. If you have hypertension, you may need to reduce your sodium intake to 1,500 mg a day.  When on the DASH eating plan, aim to eat more fresh fruits and vegetables, whole grains, lean proteins, low-fat dairy, and heart-healthy fats.  Work with your health care provider or diet and nutrition specialist (dietitian) to adjust your eating plan to your individual   calorie needs. This information is not intended to replace advice given to you by your health care provider. Make sure you discuss any questions you have with your health care provider. Document Released: 12/05/2011 Document Revised: 12/09/2016 Document Reviewed: 12/09/2016 Elsevier Interactive Patient Education  2018 [place].  Peripheral Vascular Disease Peripheral vascular disease (PVD) is a disease of the blood  vessels that are not part of your heart and brain. A simple term for PVD is poor circulation. In most cases, PVD narrows the blood vessels that carry blood from your heart to the rest of your body. This can result in a decreased supply of blood to your arms, legs, and internal organs, like your stomach or kidneys. However, it most often affects a person's lower legs and feet. There are two types of PVD.  Organic PVD. This is the more common type. It is caused by damage to the structure of blood vessels.  Functional PVD. This is caused by conditions that make blood vessels contract and tighten (spasm).  Without treatment, PVD tends to get worse over time. PVD can also lead to acute ischemic limb. This is when an arm or limb suddenly has trouble getting enough blood. This is a medical emergency. What are the causes? Each type of PVD has many different causes. The most common cause of PVD is buildup of a fatty material (plaque) inside of your arteries (atherosclerosis). Small amounts of plaque can break off from the walls of the blood vessels and become lodged in a smaller artery. This blocks blood flow and can cause acute ischemic limb. Other common causes of PVD include:  Blood clots that form inside of blood vessels.  Injuries to blood vessels.  Diseases that cause inflammation of blood vessels or cause blood vessel spasms.  Health behaviors and health history that increase your risk of developing PVD.  What increases the risk? You may have a greater risk of PVD if you:  Have a family history of PVD.  Have certain medical conditions, including: ? High cholesterol. ? Diabetes. ? High blood pressure (hypertension). ? Coronary heart disease. ? Past problems with blood clots. ? Past injury, such as burns or a broken bone. These may have damaged blood vessels in your limbs. ? Buerger disease. This is caused by inflamed blood vessels in your hands and feet. ? Some forms of  arthritis. ? Rare birth defects that affect the arteries in your legs.  Use tobacco.  Do not get enough exercise.  Are obese.  Are age 56 or older.  What are the signs or symptoms? PVD may cause many different symptoms. Your symptoms depend on what part of your body is not getting enough blood. Some common signs and symptoms include:  Cramps in your lower legs. This may be a symptom of poor leg circulation (claudication).  Pain and weakness in your legs while you are physically active that goes away when you rest (intermittent claudication).  Leg pain when at rest.  Leg numbness, tingling, or weakness.  Coldness in a leg or foot, especially when compared with the other leg.  Skin or hair changes. These can include: ? Hair loss. ? Shiny skin. ? Pale or bluish skin. ? Thick toenails.  Inability to get or maintain an erection (erectile dysfunction).  People with PVD are more prone to developing ulcers and sores on their toes, feet, or legs. These may take longer than normal to heal. How is this diagnosed? Your health care provider may diagnose PVD from  your signs and symptoms. The health care provider will also do a physical exam. You may have tests to find out what is causing your PVD and determine its severity. Tests may include:  Blood pressure recordings from your arms and legs and measurements of the strength of your pulses (pulse volume recordings).  Imaging studies using sound waves to take pictures of the blood flow through your blood vessels (Doppler ultrasound).  Injecting a dye into your blood vessels before having imaging studies using: ? X-rays (angiogram or arteriogram). ? Computer-generated X-rays (CT angiogram). ? A powerful electromagnetic field and a computer (magnetic resonance angiogram or MRA).  How is this treated? Treatment for PVD depends on the cause of your condition and the severity of your symptoms. It also depends on your age. Underlying causes  need to be treated and controlled. These include long-lasting (chronic) conditions, such as diabetes, high cholesterol, and high blood pressure. You may need to first try making lifestyle changes and taking medicines. Surgery may be needed if these do not work. Lifestyle changes may include:  Quitting smoking.  Exercising regularly.  Following a low-fat, low-cholesterol diet.  Medicines may include:  Blood thinners to prevent blood clots.  Medicines to improve blood flow.  Medicines to improve your blood cholesterol levels.  Surgical procedures may include:  A procedure that uses an inflated balloon to open a blocked artery and improve blood flow (angioplasty).  A procedure to put in a tube (stent) to keep a blocked artery open (stent implant).  Surgery to reroute blood flow around a blocked artery (peripheral bypass surgery).  Surgery to remove dead tissue from an infected wound on the affected limb.  Amputation. This is surgical removal of the affected limb. This may be necessary in cases of acute ischemic limb that are not improved through medical or surgical treatments.  Follow these instructions at home:  Take medicines only as directed by your health care provider.  Do not use any tobacco products, including cigarettes, chewing tobacco, or electronic cigarettes. If you need help quitting, ask your health care provider.  Lose weight if you are overweight, and maintain a healthy weight as directed by your health care provider.  Eat a diet that is low in fat and cholesterol. If you need help, ask your health care provider.  Exercise regularly. Ask your health care provider to suggest some good activities for you.  Use compression stockings or other mechanical devices as directed by your health care provider.  Take good care of your feet. ? Wear comfortable shoes that fit well. ? Check your feet often for any cuts or sores. Contact a health care provider if:  You  have cramps in your legs while walking.  You have leg pain when you are at rest.  You have coldness in a leg or foot.  Your skin changes.  You have erectile dysfunction.  You have cuts or sores on your feet that are not healing. Get help right away if:  Your arm or leg turns cold and blue.  Your arms or legs become red, warm, swollen, painful, or numb.  You have chest pain or trouble breathing.  You suddenly have weakness in your face, arm, or leg.  You become very confused or lose the ability to speak.  You suddenly have a very bad headache or lose your vision. This information is not intended to replace advice given to you by your health care provider. Make sure you discuss any questions you have with  your health care provider. Document Released: 01/23/2005 Document Revised: 05/23/2016 Document Reviewed: 05/26/2014 Elsevier Interactive Patient Education  2017 [employer].

## 2018-02-27 NOTE — Progress Notes (Signed)
Patient presents to clinic today for chronic issues and to establish care.  SUBJECTIVE: PMH:  Pt is a 57 yo female with pmh sig for HTN, PVD s/p fem-pop bypass, DVT, depression, tobacco abuse, substance abuse, chronic pain.  The pt was formally seen by [facility]. Pt was recently hospitalized 2/17-19 after dyspnea, chest discomfort and questionable LOC.  In the hospital patient was noted with uncontrolled HTN and heart failure (HFrEF).  EF on echo 35-40% global hypokinesis and grade 1 diastolic dysfunction.  Other cardiac workup was negative.  Pt to follow-up with cardiology in the next few weeks.  Of note incidental finding on CT, ascending thoracic aortic ectasia 3.5 cm will need repeat CTA or MRA in 1 year.  Nicotine use: -Patient smoking 1-6 cigarettes/d -Started smoking at age 18 -Patient does not think she needs to quit or cut down given the small amount that she smokes.  HTN, uncontrolled: -Patient states she is taking all of her medications as prescribed -Patient endorses continued issues with blood pressure. -Patient does not check BP at home  Substance abuse: -Patient has a history of cocaine use -Initially patient denies drug use. -When asked about hospital visits that suggest otherwise "oh yeah, sometimes I get cigarettes that have other stuff in them, like marijuana or cocaine" -Patient states last cocaine use the beginning of February. -Patient endorses taking Coreg daily  PVD: -Patient endorses ongoing history of peripheral vascular disease. -She is status post femoropopliteal bypass in the left leg. -Patient endorses chronic pain  Chronic pain: -Patient endorses history of pain 2/2 arthritis, PVD -Patient states she takes Percocet for her pain and Vicodin for breakthrough. -Patient advised this provider does not prescribe opioids.  She states "oh I can get that from someone else".  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  Review of chart from hospitalization documentation states patient has not been on warfarin for the past year. -When asked about last INR check patient states a year ago. -During patient's hospitalization her INR was subtherapeutic.  Depression: -Patient states she is followed by [facility] -Patient states she is currently feels stable on the current medications. -Patient is taking BuSpar 10 mg, Valium 5 mg, Vistaril 25 mg, Latuda 80 mg, trazodone 50 mg, trazodone 60 mg -Patient states her last "nervous breakdown" was in October/November 2018.  Asthma?: -pt reports h/o asthma though has never been tested -pt states when her pain hits she leans forward and changes her breathing to ease the pain. -It is at this time she uses the albuterol inhaler. -pt denies coughing at night, wheezing, or other asthma like symptoms.  Allergies: Darvocet-anaphylaxis Strawberries-anaphylaxis Cats-shortness of breath, hard to breathe ?Kale-SOB, hard to breath  Social history: Patient is divorced.  She states that she is disabled currently and is not working.  Patient endorses alcohol, tobacco, drug use.  Family medical history: Mom-depression, HLD, mental retardation Dad-HLD, mental retardation Sister- mental retardation  Sister-depression, HLD Daughter-mental retardation.  Health maintenance: Immunizations--influenza vaccine February 2019 Mammogram--due Pap--due   Past Medical History:  Diagnosis Date  . Anxiety   . Asthma   . Breast lump 02/2008   Biopsy 05/2008: showed no evidence of malignancy  . Breast mass in female    bi lat   . Chronic pain syndrome   . Degenerative joint disease of knee   . Depression   . DVT (deep venous thrombosis) ([place])  bilateral, 2 episodes: Requires lifelong therapy  . Endometrial mass 10/12/2012   Endometrium, biopsy on 11/04/12 - PROLIFERATIVE ENDOMETRIUM AND ABUNDANT MUCUS. NO  HYPERPLASIA OR CARCINOMA.   . Hyperlipidemia   . Hypertension   . Irregular menses 9/08  . Joint pain   . Mixed stress and urge urinary incontinence    Being [facility], underwent cystoscopy & uroflowmetry   . Pulmonary edema   . PVD (peripheral vascular disease) ([place])    s/p L fem-pop bypass 11/21/08; graft occluded 12/28/08;  aortogram w/ bilat LE runoff: 1.  bilat diffuse SFA occlusive dz, 2.  Mod to severe above-knee popliteal dz,  3.  Bilat 3-vessel runoff w/ mild tibial occlusive dz.  . Restless leg syndrome   . Sigmoid diverticulitis 10/26/2012  . Tobacco abuse     Past Surgical History:  Procedure Laterality Date  .  Right external iliac artery stent    . BREAST LUMPECTOMY WITH RADIOACTIVE SEED LOCALIZATION Bilateral 05/03/2014   Procedure: BREAST LUMPECTOMY WITH RADIOACTIVE SEED LOCALIZATION;  Surgeon: Joyice Faster. Cornett, MD;  Location: [facility];  Service: General;  Laterality: Bilateral;  . CARDIAC CATHETERIZATION  2/15  . CAROTID ENDARTERECTOMY    . FEMORAL BYPASS  11/09  . LEFT HEART CATHETERIZATION WITH CORONARY ANGIOGRAM N/A 02/25/2014   Procedure: LEFT HEART CATHETERIZATION WITH CORONARY ANGIOGRAM;  Surgeon: Peter M Martinique, MD;  Location: [facility] CATH LAB;  Service: Cardiovascular;  Laterality: N/A;  . PR VEIN BYPASS GRAFT,AORTO-FEM-POP    . Right breast needle-localized lumpectomy    . TONSILLECTOMY    . TUBAL LIGATION      Current Outpatient Medications on File Prior to Visit  Medication Sig Dispense Refill  . albuterol (VENTOLIN HFA) 108 (90 Base) MCG/ACT inhaler Inhale 1-2 puffs into the lungs every 6 (six) hours as needed for wheezing or shortness of breath. 1 Inhaler 2  . amLODipine (NORVASC) 5 MG tablet Take 0.5 tablets (2.5 mg total) by mouth daily. 90 tablet 0  . aspirin EC 81 MG tablet Take 81 mg by mouth daily.    Marland Kitchen atorvastatin (LIPITOR) 40 MG tablet Take 1 tablet (40 mg total) by mouth at bedtime. 90 tablet 3  . busPIRone  (BUSPAR) 10 MG tablet Take 1 tablet (10 mg total) by mouth 3 (three) times daily.    . carvedilol (COREG) 12.5 MG tablet Take 1 tablet (12.5 mg total) by mouth 2 (two) times daily with a meal. 180 tablet 1  . diazepam (VALIUM) 5 MG tablet Take 5 mg by mouth 3 (three) times daily.     . DULoxetine (CYMBALTA) 60 MG capsule Take 60 mg by mouth daily.     . hydrochlorothiazide (HYDRODIURIL) 25 MG tablet Take 1 tablet (25 mg total) by mouth daily. 90 tablet 1  . hydrOXYzine (VISTARIL) 25 MG capsule Take 25 mg by mouth 3 (three) times daily as needed for anxiety.    Marland Kitchen lisinopril (PRINIVIL,ZESTRIL) 20 MG tablet Take 1 tablet (20 mg total) by mouth daily. 90 tablet 1  . lurasidone (LATUDA) 80 MG TABS tablet Take 80 mg by mouth daily with breakfast. Take 1/2 tablet (85m) once daily     . traZODone (DESYREL) 50 MG tablet Take 50 mg by mouth at bedtime.    . ziprasidone (GEODON) 60 MG capsule Take 60 mg by mouth 2 (two) times daily with a meal.      No current facility-administered medications on file prior to visit.     Allergies  Allergen Reactions  . Propoxyphene N-Acetaminophen Swelling  . Grapefruit Extract     Interaction with medications she's taking  . Strawberry Extract     Electric shock     Family History  Problem Relation Age of Onset  . Breast cancer Mother   . Depression Mother   . Hyperlipidemia Mother   . Mental retardation Mother   . Hyperlipidemia Father   . Mental retardation Father   . Breast cancer Other        GREAT AUNT  . Depression Sister   . Hyperlipidemia Sister   . Mental retardation Sister   . Mental retardation Daughter   . Colon cancer Neg Hx   . Rectal cancer Neg Hx   . Stomach cancer Neg Hx     Social History   Socioeconomic History  . Marital status: Divorced    Spouse name: Not on file  . Number of children: 4  . Years of education: Not on file  . Highest education level: Not on file  Social Needs  . Financial resource strain: Not on file    . Food insecurity - worry: Not on file  . Food insecurity - inability: Not on file  . Transportation needs - medical: Not on file  . Transportation needs - non-medical: Not on file  Occupational History  . Occupation: disable    Employer: UNEMPLOYED  Tobacco Use  . Smoking status: Current Every Day Smoker    Packs/day: 0.50    Years: 18.00    Pack years: 9.00    Types: Cigarettes  . Smokeless tobacco: Never Used  . Tobacco comment: 1pk 1/2 PACK DAY 2-3 0r none  Substance and Sexual Activity  . Alcohol use: Yes    Alcohol/week: 0.0 oz    Comment: rare  . Drug use: No    Comment: hx of marijuana and cocaine use  . Sexual activity: No  Other Topics Concern  . Not on file  Social History Narrative   Financial assistance application initiated. Patient needs to submit further paperwork to complete   [facility]  September 11, 2010 2:04 PM   Financial assistance approved for 100% discount at [facility] and has [facility] card   [facility]  October 04, 2010 5:29 PM    ROS General: Denies fever, chills, night sweats, changes in weight, changes in appetite HEENT: Denies headaches, ear pain, changes in vision, rhinorrhea, sore throat CV: Denies CP, palpitations, SOB, orthopnea Pulm: Denies SOB, cough, wheezing GI: Denies abdominal pain, nausea, vomiting, diarrhea, constipation GU: Denies dysuria, hematuria, frequency, vaginal discharge Msk: Denies muscle cramps, joint pains  +chronic pain, OA of knees Neuro: Denies weakness, numbness, tingling Skin: Denies rashes, bruising Psych: Denies hallucinations  +depression/anxiety   BP (!) 150/106 (BP Location: Left Arm, Patient Position: Sitting, Cuff Size: Normal)   Pulse 86   Temp 97.6 F (36.4 C) (Oral)   Ht _0  (1.626 m)   Wt 214 lb (97.1 kg)   LMP 11/22/2015   SpO2 97%   BMI 36.73 kg/m   Physical Exam Gen. Pleasant, well developed, well-nourished, in NAD HEENT - /AT, PERRLA, no scleral icterus, no nasal drainage, pharynx  without erythema or exudate. Lungs: no use of accessory muscles, CTAB, no wheezes, rales or rhonchi Cardiovascular: RRR, No r/g/m, no peripheral edema Abdomen: central obesity, BS present, soft, nontender,nondistended Neuro:  A&Ox3, CN II-XII intact, normal gait Skin:  Warm, dry, intact, no lesions Psych: normal affect, mood appropriate.  Recent Results (from the  past 2160 hour(s))  CBC with Differential/Platelet     Status: Abnormal   Collection Time: 01/07/18  8:24 PM  Result Value Ref Range   WBC 7.7 4.0 - 10.5 K/uL   RBC 4.79 3.87 - 5.11 MIL/uL   Hemoglobin 15.5 (H) 12.0 - 15.0 g/dL   HCT 46.7 (H) 36.0 - 46.0 %   MCV 97.5 78.0 - 100.0 fL   MCH 32.4 26.0 - 34.0 pg   MCHC 33.2 30.0 - 36.0 g/dL   RDW 12.7 11.5 - 15.5 %   Platelets 205 150 - 400 K/uL   Neutrophils Relative % 44 %   Neutro Abs 3.4 1.7 - 7.7 K/uL   Lymphocytes Relative 47 %   Lymphs Abs 3.6 0.7 - 4.0 K/uL   Monocytes Relative 6 %   Monocytes Absolute 0.5 0.1 - 1.0 K/uL   Eosinophils Relative 3 %   Eosinophils Absolute 0.2 0.0 - 0.7 K/uL   Basophils Relative 0 %   Basophils Absolute 0.0 0.0 - 0.1 K/uL  Basic metabolic panel     Status: Abnormal   Collection Time: 01/07/18  8:24 PM  Result Value Ref Range   Sodium 139 135 - 145 mmol/L   Potassium 3.8 3.5 - 5.1 mmol/L   Chloride 107 101 - 111 mmol/L   CO2 24 22 - 32 mmol/L   Glucose, Bld 95 65 - 99 mg/dL   BUN 18 6 - 20 mg/dL   Creatinine, Ser 1.03 (H) 0.44 - 1.00 mg/dL   Calcium 9.0 8.9 - 10.3 mg/dL   GFR calc non Af Amer >60 >60 mL/min   GFR calc Af Amer >60 >60 mL/min    Comment: (NOTE) The eGFR has been calculated using the CKD EPI equation. This calculation has not been validated in all clinical situations. eGFR's persistently <60 mL/min signify possible Chronic Kidney Disease.    Anion gap 8 5 - 15  CBC with Differential/Platelet     Status: Abnormal   Collection Time: 02/15/18  6:36 AM  Result Value Ref Range   WBC 10.9 (H) 4.0 - 10.5 K/uL    RBC 5.13 (H) 3.87 - 5.11 MIL/uL   Hemoglobin 16.8 (H) 12.0 - 15.0 g/dL   HCT 51.0 (H) 36.0 - 46.0 %   MCV 99.4 78.0 - 100.0 fL   MCH 32.7 26.0 - 34.0 pg   MCHC 32.9 30.0 - 36.0 g/dL   RDW 12.8 11.5 - 15.5 %   Platelets 231 150 - 400 K/uL   Neutrophils Relative % 40 %   Neutro Abs 4.4 1.7 - 7.7 K/uL   Lymphocytes Relative 51 %   Lymphs Abs 5.6 (H) 0.7 - 4.0 K/uL   Monocytes Relative 5 %   Monocytes Absolute 0.6 0.1 - 1.0 K/uL   Eosinophils Relative 3 %   Eosinophils Absolute 0.3 0.0 - 0.7 K/uL   Basophils Relative 1 %   Basophils Absolute 0.1 0.0 - 0.1 K/uL    Comment: Performed at [facility], [address] [place], [place] [address]  Brain natriuretic peptide     Status: Abnormal   Collection Time: 02/15/18  6:36 AM  Result Value Ref Range   B Natriuretic Peptide 132.0 (H) 0.0 - 100.0 pg/mL    Comment: Performed at [facility] [address], [place], [place] [address]  Comprehensive metabolic panel     Status: Abnormal   Collection Time: 02/15/18  6:36 AM  Result Value Ref Range   Sodium 142  135 - 145 mmol/L   Potassium 4.1 3.5 - 5.1 mmol/L   Chloride 109 101 - 111 mmol/L   CO2 18 (L) 22 - 32 mmol/L   Glucose, Bld 315 (H) 65 - 99 mg/dL   BUN 13 6 - 20 mg/dL   Creatinine, Ser 1.22 (H) 0.44 - 1.00 mg/dL   Calcium 8.3 (L) 8.9 - 10.3 mg/dL   Total Protein 7.0 6.5 - 8.1 g/dL   Albumin 3.6 3.5 - 5.0 g/dL   AST 55 (H) 15 - 41 U/L   ALT 31 14 - 54 U/L   Alkaline Phosphatase 97 38 - 126 U/L   Total Bilirubin 0.9 0.3 - 1.2 mg/dL   GFR calc non Af Amer 49 (L) >60 mL/min   GFR calc Af Amer 57 (L) >60 mL/min    Comment: (NOTE) The eGFR has been calculated using the CKD EPI equation. This calculation has not been validated in all clinical situations. eGFR's persistently <60 mL/min signify possible Chronic Kidney Disease.    Anion gap 15 5 - 15    Comment: Performed at [facility] [address], [place], [place] [address]  Protime-INR     Status: None    Collection Time: 02/15/18  6:36 AM  Result Value Ref Range   Prothrombin Time 12.5 11.4 - 15.2 seconds   INR 0.95     Comment: Performed at [facility] [address] [place], [place] [address]  I-stat troponin, ED     Status: None   Collection Time: 02/15/18  6:42 AM  Result Value Ref Range   Troponin i, poc 0.01 0.00 - 0.08 ng/mL   Comment 3            Comment: Due to the release kinetics of cTnI, a negative result within the first hours of the onset of symptoms does not rule out myocardial infarction with certainty. If myocardial infarction is still suspected, repeat the test at appropriate intervals.   I-stat chem 8, ed     Status: Abnormal   Collection Time: 02/15/18  6:43 AM  Result Value Ref Range   Sodium 144 135 - 145 mmol/L   Potassium 4.0 3.5 - 5.1 mmol/L   Chloride 110 101 - 111 mmol/L   BUN 15 6 - 20 mg/dL   Creatinine, Ser 1.10 (H) 0.44 - 1.00 mg/dL   Glucose, Bld 302 (H) 65 - 99 mg/dL   Calcium, Ion 1.08 (L) 1.15 - 1.40 mmol/L   TCO2 24 22 - 32 mmol/L   Hemoglobin 17.7 (H) 12.0 - 15.0 g/dL   HCT 52.0 (H) 36.0 - 46.0 %  CBG monitoring, ED     Status: Abnormal   Collection Time: 02/15/18  6:49 AM  Result Value Ref Range   Glucose-Capillary 296 (H) 65 - 99 mg/dL  Urine rapid drug screen (hosp performed)     Status: None   Collection Time: 02/15/18  9:16 AM  Result Value Ref Range   Opiates NONE DETECTED NONE DETECTED   Cocaine NONE DETECTED NONE DETECTED   Benzodiazepines NONE DETECTED NONE DETECTED   Amphetamines NONE DETECTED NONE DETECTED   Tetrahydrocannabinol NONE DETECTED NONE DETECTED   Barbiturates NONE DETECTED NONE DETECTED    Comment: (NOTE) DRUG SCREEN FOR MEDICAL PURPOSES ONLY.  IF CONFIRMATION IS NEEDED FOR ANY PURPOSE, NOTIFY LAB WITHIN 5 DAYS. LOWEST DETECTABLE LIMITS FOR URINE DRUG SCREEN Drug Class  Cutoff (ng/mL) Amphetamine and metabolites    1000 Barbiturate and metabolites    200 Benzodiazepine                  937 Tricyclics and metabolites     300 Opiates and metabolites        300 Cocaine and metabolites        300 THC                            50 Performed at [facility], [place] [address], [place], [place] [address]   Troponin I     Status: Abnormal   Collection Time: 02/15/18 10:45 AM  Result Value Ref Range   Troponin I 0.04 (HH) <0.03 ng/mL    Comment: CRITICAL RESULT CALLED TO, READ BACK BY AND VERIFIED WITH: CASEY HAMANN,RN [id number] 02/15/18 THOMPSON V Performed at [facility], [place] [address], [place], [place] [address]   Troponin I     Status: Abnormal   Collection Time: 02/15/18  4:45 PM  Result Value Ref Range   Troponin I 0.04 (HH) <0.03 ng/mL    Comment: CRITICAL VALUE NOTED.  VALUE IS CONSISTENT WITH PREVIOUSLY REPORTED AND CALLED VALUE. Performed at [facility], [place] [address], [place], [place] [address]   Basic metabolic panel     Status: Abnormal   Collection Time: 02/16/18  6:23 AM  Result Value Ref Range   Sodium 141 135 - 145 mmol/L   Potassium 4.2 3.5 - 5.1 mmol/L   Chloride 106 101 - 111 mmol/L   CO2 25 22 - 32 mmol/L   Glucose, Bld 110 (H) 65 - 99 mg/dL   BUN 13 6 - 20 mg/dL   Creatinine, Ser 1.04 (H) 0.44 - 1.00 mg/dL   Calcium 9.1 8.9 - 10.3 mg/dL   GFR calc non Af Amer 59 (L) >60 mL/min   GFR calc Af Amer >60 >60 mL/min    Comment: (NOTE) The eGFR has been calculated using the CKD EPI equation. This calculation has not been validated in all clinical situations. eGFR's persistently <60 mL/min signify possible Chronic Kidney Disease.    Anion gap 10 5 - 15    Comment: Performed at [facility] [address], [place], [place] [address]  ECHOCARDIOGRAM COMPLETE     Status: None   Collection Time: 02/16/18 12:21 PM  Result Value Ref Range   Weight 3,448 oz   Height 64.5 in   BP 152/88 mmHg    Assessment/Plan: Essential hypertension -Uncontrolled -Medication compliance questionable -Taking Coreg 12.5, Norvasc 5  mg, hydrochlorothiazide 25 mg, lisinopril 20 mg. -Discussed the importance of refraining from cocaine use while on beta-blocker. -Lifestyle modifications encouraged -Patient encouraged to keep follow-up appointment with cardiology  Chronic anticoagulation -Unclear how long patient has been off of Coumadin. -INR during hospitalization was subtherapeutic at 1.9 -Offered a refill on medication, however patient states she has plenty. -Attempted to discussed the importance of anticoagulation therapy given numerous DVTs.  Substance abuse [facility]) -Patient states last cocaine use was the beginning of February.  - Plan: Urine Drug Screen w/[facility], no confirm([facility])  Chronic pain syndrome -Patient advised that I would not be writing any prescriptions for narcotics. -Offered referral to pain clinic.  However patient states she can find one to take care of her pain  Encounter to establish care -We reviewed the PMH, PSH, FH, SH, Meds and Allergies. -We provided  refills for any medications we will prescribe as needed. -We addressed current concerns per orders and patient instructions. -We have asked for records for pertinent exams, studies, vaccines and notes from previous providers. -We have advised patient to follow up per instructions below.  F/u in 1 month  Grier Mitts, MD

## 2018-02-28 LAB — DRUG SCREEN URINE W/ALC, NO CONF
ALCOHOL, ETHYL (U): NEGATIVE
AMPHETAMINES (1000 ng/mL SCRN): NEGATIVE
BARBITURATES: NEGATIVE
BENZODIAZEPINES: NEGATIVE
COCAINE METABOLITES: NEGATIVE
MARIJUANA MET (50 ng/mL SCRN): NEGATIVE
METHADONE: NEGATIVE
METHAQUALONE: NEGATIVE
OPIATES: NEGATIVE
PHENCYCLIDINE: NEGATIVE
PROPOXYPHENE: NEGATIVE

## 2018-03-02 ENCOUNTER — Encounter: Payer: Self-pay | Admitting: Family Medicine

## 2018-03-09 NOTE — Progress Notes (Deleted)
.   Cardiology Office Note   Date:  03/09/2018   ID:  Alisha [name], DOB 1961/11/12, MRN [id number]  PCP:  Alisha Ruddy, MD  Cardiologist:  NEW Dr. Tamala Julian     No chief complaint on file.     History of Present Illness: Alisha [name] is a 57 y.o. female who is being seen today for the evaluation of *** at the request of No ref. provider found.   No flowsheet data found.  Cardiac cath 02/25/14  Done for abnormal  EKG and myoview.  This was for pre-op for breast cancer.  LAD was 20% EF 55-65%  Echo now with EF 35-40% done for syncope. G1DD moderate concentric LVH.   Syncope 02/15/18 going to [facility] and developed DOE and chest heaviness, nausea  She became unresponsive they performed CPR  EMS foiund a faint pulse.  Rec;d epi and narcan albuterol and nasally intubated.  Last used cocoaine 3 days prior to event.  CTA of chest neg for pe. tropponin 0.04 X 2  Past Medical History:  Diagnosis Date  . Anxiety   . Asthma   . Breast lump 02/2008   Biopsy 05/2008: showed no evidence of malignancy  . Breast mass in female    bi lat   . Chronic pain syndrome   . Degenerative joint disease of knee   . Depression   . 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 Endometrial mass 10/12/2012   Endometrium, biopsy on 11/04/12 - PROLIFERATIVE ENDOMETRIUM AND ABUNDANT MUCUS. NO HYPERPLASIA OR CARCINOMA.   . Hyperlipidemia   . Hypertension   . Irregular menses 9/08  . Joint pain   . Mixed stress and urge urinary incontinence    Being [facility], underwent cystoscopy & uroflowmetry   . Pulmonary edema   . PVD (peripheral vascular disease) ([place])    s/p L fem-pop bypass 11/21/08; graft occluded 12/28/08;  aortogram w/ bilat LE runoff: 1.  bilat diffuse SFA occlusive dz, 2.  Mod to severe above-knee popliteal dz,  3.  Bilat 3-vessel runoff w/ mild tibial occlusive dz.  . Restless leg syndrome   . Sigmoid diverticulitis  10/26/2012  . Tobacco abuse     Past Surgical History:  Procedure Laterality Date  .  Right external iliac artery stent    . BREAST LUMPECTOMY WITH RADIOACTIVE SEED LOCALIZATION Bilateral 05/03/2014   Procedure: BREAST LUMPECTOMY WITH RADIOACTIVE SEED LOCALIZATION;  Surgeon: Joyice Faster. Cornett, MD;  Location: [place];  Service: General;  Laterality: Bilateral;  . CARDIAC CATHETERIZATION  2/15  . CAROTID ENDARTERECTOMY    . FEMORAL BYPASS  11/09  . LEFT HEART CATHETERIZATION WITH CORONARY ANGIOGRAM N/A 02/25/2014   Procedure: LEFT HEART CATHETERIZATION WITH CORONARY ANGIOGRAM;  Surgeon: Peter M Martinique, MD;  Location: [facility] CATH LAB;  Service: Cardiovascular;  Laterality: N/A;  . PR VEIN BYPASS GRAFT,AORTO-FEM-POP    . Right breast needle-localized lumpectomy    . TONSILLECTOMY    . TUBAL LIGATION       Current Outpatient Medications  Medication Sig Dispense Refill  . albuterol (VENTOLIN HFA) 108 (90 Base) MCG/ACT inhaler Inhale 1-2 puffs into the lungs every 6 (six) hours as needed for wheezing or shortness of breath. 1 Inhaler 2  . amLODipine (NORVASC) 5 MG tablet Take 0.5 tablets (2.5 mg total) by mouth daily. 90 tablet 0  . aspirin EC 81 MG tablet Take 81 mg by mouth daily.    Marland Kitchen  atorvastatin (LIPITOR) 40 MG tablet Take 1 tablet (40 mg total) by mouth at bedtime. 90 tablet 3  . busPIRone (BUSPAR) 10 MG tablet Take 1 tablet (10 mg total) by mouth 3 (three) times daily.    . carvedilol (COREG) 12.5 MG tablet Take 1 tablet (12.5 mg total) by mouth 2 (two) times daily with a meal. 180 tablet 1  . diazepam (VALIUM) 5 MG tablet Take 5 mg by mouth 3 (three) times daily.     . DULoxetine (CYMBALTA) 60 MG capsule Take 60 mg by mouth daily.     . hydrochlorothiazide (HYDRODIURIL) 25 MG tablet Take 1 tablet (25 mg total) by mouth daily. 90 tablet 1  . hydrOXYzine (VISTARIL) 25 MG capsule Take 25 mg by mouth 3 (three) times daily as needed for anxiety.    Marland Kitchen lisinopril  (PRINIVIL,ZESTRIL) 20 MG tablet Take 1 tablet (20 mg total) by mouth daily. 90 tablet 1  . lurasidone (LATUDA) 80 MG TABS tablet Take 80 mg by mouth daily with breakfast. Take 1/2 tablet (20mg ) once daily     . traZODone (DESYREL) 50 MG tablet Take 50 mg by mouth at bedtime.    . ziprasidone (GEODON) 60 MG capsule Take 60 mg by mouth 2 (two) times daily with a meal.      No current facility-administered medications for this visit.     Allergies:   Propoxyphene n-acetaminophen; Grapefruit extract; and Strawberry extract    Social History:  The patient  reports that she has been smoking cigarettes.  She has a 9.00 pack-year smoking history. she has never used smokeless tobacco. She reports that she drinks alcohol. She reports that she does not use drugs.   Family History:  The patient's ***family history includes Breast cancer in her mother and other; Depression in her mother and sister; Hyperlipidemia in her father, mother, and sister; Mental retardation in her daughter, father, mother, and sister.    ROS:  General:no colds or fevers, no weight changes Skin:no rashes or ulcers HEENT:no blurred vision, no congestion CV:see HPI PUL:see HPI GI:no diarrhea constipation or melena, no indigestion GU:no hematuria, no dysuria MS:no joint pain, no claudication Neuro:no syncope, no lightheadedness Endo:no diabetes, no thyroid disease Wt Readings from Last 3 Encounters:  02/27/18 214 lb (97.1 kg)  02/17/18 214 lb 3.2 oz (97.2 kg)  10/07/16 176 lb 9.6 oz (80.1 kg)     PHYSICAL EXAM: VS:  LMP 11/22/2015  , BMI There is no height or weight on file to calculate BMI. General:Pleasant affect, NAD Skin:Warm and dry, brisk capillary refill HEENT:normocephalic, sclera clear, mucus membranes moist Neck:supple, no JVD, no bruits  Heart:S1S2 RRR without murmur, gallup, rub or click Lungs:clear without rales, rhonchi, or wheezes TDV:VOHY, non tender, + BS, do not palpate liver spleen or masses Ext:no  lower ext edema, 2+ pedal pulses, 2+ radial pulses Neuro:alert and oriented, MAE, follows commands, + facial symmetry    EKG:  EKG is ordered today. The ekg ordered today demonstrates ***   Recent Labs: 02/15/2018: ALT 31; B Natriuretic Peptide 132.0; Hemoglobin 17.7; Platelets 231 02/16/2018: BUN 13; Creatinine, Ser 1.04; Potassium 4.2; Sodium 141    Lipid Panel    Component Value Date/Time   CHOL 162 10/01/2011 1012   TRIG 205 (H) 10/01/2011 1012   HDL 25 (L) 10/01/2011 1012   CHOLHDL 6.5 10/01/2011 1012   VLDL 41 (H) 10/01/2011 1012   LDLCALC 96 10/01/2011 1012       Other studies Reviewed: Additional studies/ records  that were reviewed today include: ***.   ASSESSMENT AND PLAN:  1.  ***   Current medicines are reviewed with the patient today.  The patient Has no concerns regarding medicines.  The following changes have been made:  See above Labs/ tests ordered today include:see above  Disposition:   FU:  see above  Signed, Cecilie Kicks, NP  03/09/2018 10:35 PM    [address], [place], [place], [place] Phone: [phone]; Fax: [phone]

## 2018-03-10 ENCOUNTER — Ambulatory Visit: Payer: Medicare Other | Admitting: Cardiology

## 2018-03-11 ENCOUNTER — Encounter: Payer: Self-pay | Admitting: Cardiology

## 2018-04-14 DIAGNOSIS — Z79899 Other long term (current) drug therapy: Secondary | ICD-10-CM | POA: Diagnosis not present

## 2018-06-08 ENCOUNTER — Other Ambulatory Visit: Payer: Self-pay | Admitting: Internal Medicine

## 2018-07-10 DIAGNOSIS — E7849 Other hyperlipidemia: Secondary | ICD-10-CM | POA: Diagnosis not present

## 2018-07-10 DIAGNOSIS — I7389 Other specified peripheral vascular diseases: Secondary | ICD-10-CM | POA: Diagnosis not present

## 2018-07-10 DIAGNOSIS — I82409 Acute embolism and thrombosis of unspecified deep veins of unspecified lower extremity: Secondary | ICD-10-CM | POA: Diagnosis not present

## 2018-07-10 DIAGNOSIS — G894 Chronic pain syndrome: Secondary | ICD-10-CM | POA: Diagnosis not present

## 2018-07-10 DIAGNOSIS — F319 Bipolar disorder, unspecified: Secondary | ICD-10-CM | POA: Diagnosis not present

## 2018-07-10 DIAGNOSIS — J4520 Mild intermittent asthma, uncomplicated: Secondary | ICD-10-CM | POA: Diagnosis not present

## 2018-07-10 DIAGNOSIS — Z131 Encounter for screening for diabetes mellitus: Secondary | ICD-10-CM | POA: Diagnosis not present

## 2018-07-10 DIAGNOSIS — I10 Essential (primary) hypertension: Secondary | ICD-10-CM | POA: Diagnosis not present

## 2018-07-13 ENCOUNTER — Encounter

## 2018-07-13 MED ORDER — SIMVASTATIN 20 MG TAB
20 mg | ORAL_TABLET | ORAL | 0 refills | Status: DC
Start: 2018-07-13 — End: 2018-10-20

## 2018-08-10 DIAGNOSIS — I7389 Other specified peripheral vascular diseases: Secondary | ICD-10-CM | POA: Diagnosis not present

## 2018-08-10 DIAGNOSIS — I10 Essential (primary) hypertension: Secondary | ICD-10-CM | POA: Diagnosis not present

## 2018-08-10 DIAGNOSIS — F319 Bipolar disorder, unspecified: Secondary | ICD-10-CM | POA: Diagnosis not present

## 2018-08-10 DIAGNOSIS — G894 Chronic pain syndrome: Secondary | ICD-10-CM | POA: Diagnosis not present

## 2018-08-10 DIAGNOSIS — F17210 Nicotine dependence, cigarettes, uncomplicated: Secondary | ICD-10-CM | POA: Diagnosis not present

## 2018-08-10 DIAGNOSIS — J4520 Mild intermittent asthma, uncomplicated: Secondary | ICD-10-CM | POA: Diagnosis not present

## 2018-10-19 DIAGNOSIS — Z23 Encounter for immunization: Secondary | ICD-10-CM | POA: Diagnosis not present

## 2018-10-19 DIAGNOSIS — E7849 Other hyperlipidemia: Secondary | ICD-10-CM | POA: Diagnosis not present

## 2018-10-19 DIAGNOSIS — I7389 Other specified peripheral vascular diseases: Secondary | ICD-10-CM | POA: Diagnosis not present

## 2018-10-19 DIAGNOSIS — J4520 Mild intermittent asthma, uncomplicated: Secondary | ICD-10-CM | POA: Diagnosis not present

## 2018-10-19 DIAGNOSIS — R32 Unspecified urinary incontinence: Secondary | ICD-10-CM | POA: Diagnosis not present

## 2018-10-19 DIAGNOSIS — I10 Essential (primary) hypertension: Secondary | ICD-10-CM | POA: Diagnosis not present

## 2018-10-20 ENCOUNTER — Encounter

## 2018-10-22 MED ORDER — SIMVASTATIN 20 MG TAB
20 mg | ORAL_TABLET | ORAL | 0 refills | Status: AC
Start: 2018-10-22 — End: ?

## 2018-10-22 NOTE — Telephone Encounter (Signed)
Patient hasn't been seen in our clinic for more than 1 year. Will refill for 1 month

## 2018-11-10 IMAGING — DX DG CHEST 1V PORT
2 series · 2 of 2 positions shown · non-contrast
Comparison: 11/04/2017

CLINICAL DATA: Shortness of breath.  Intubation.  Hypoxia.

EXAM:
PORTABLE CHEST 1 VIEW

[chest ap (1 of 2)]
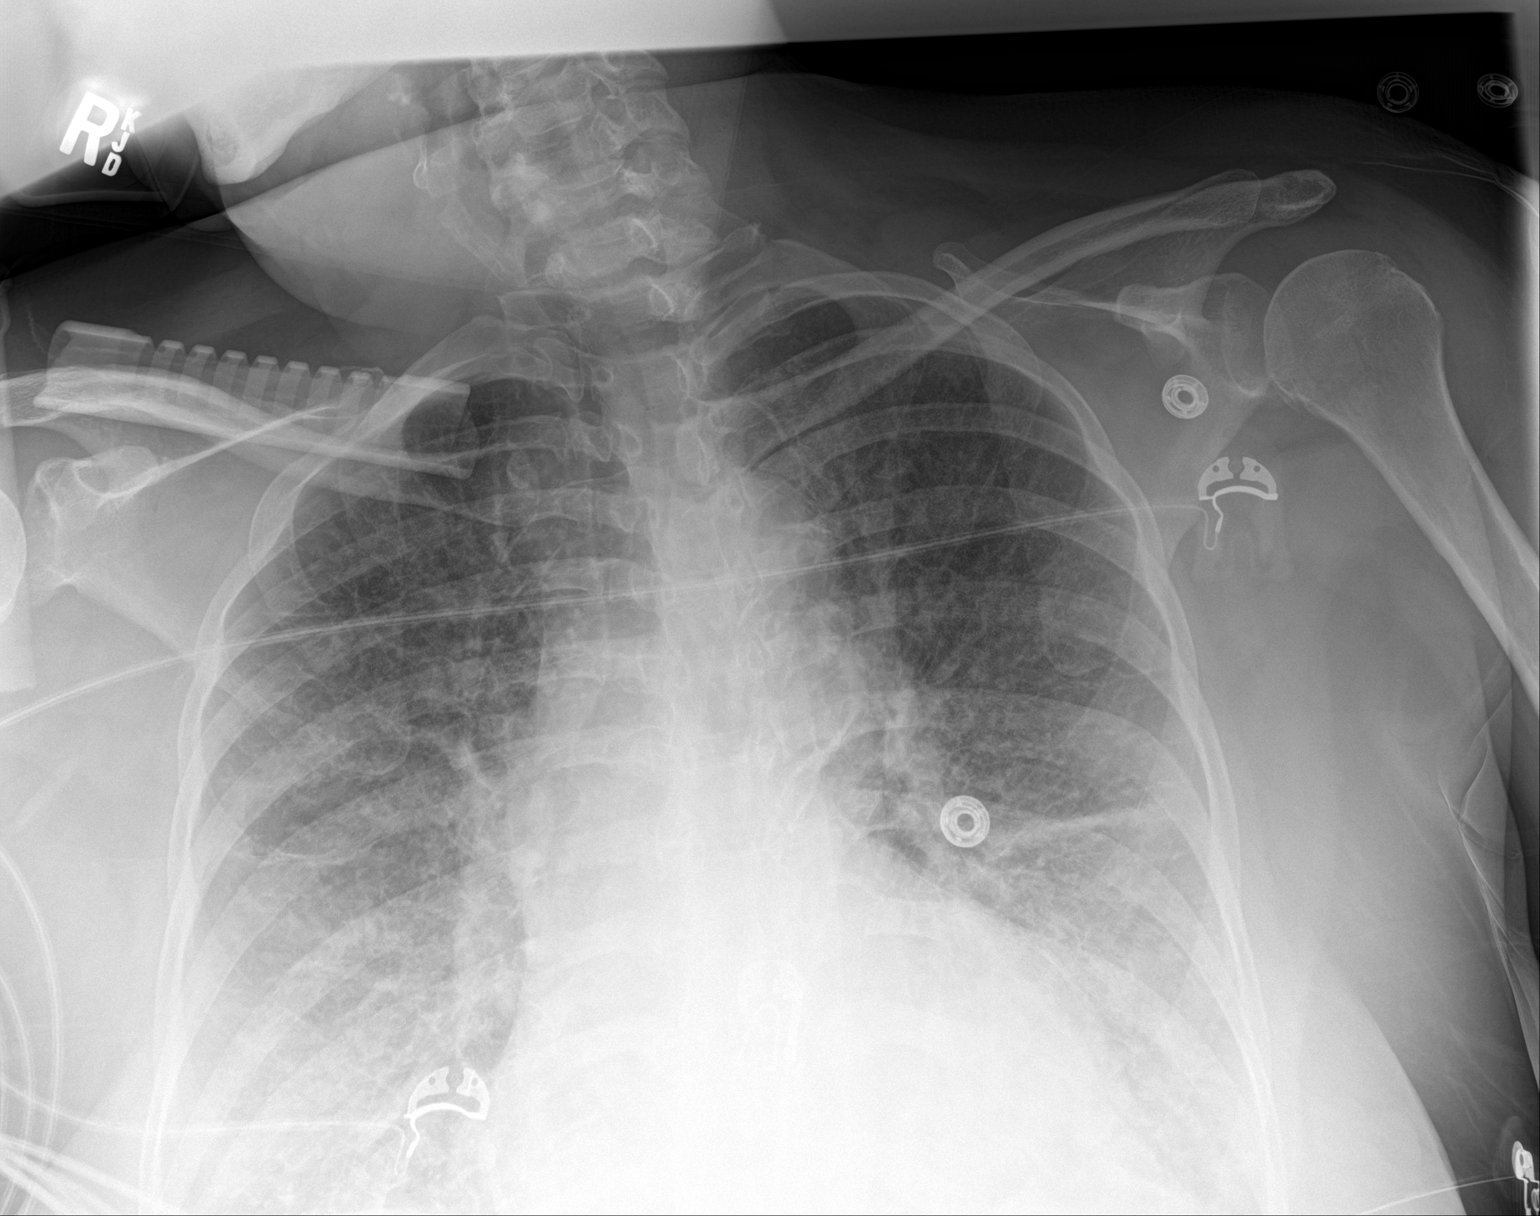

[chest ap (2 of 2)]
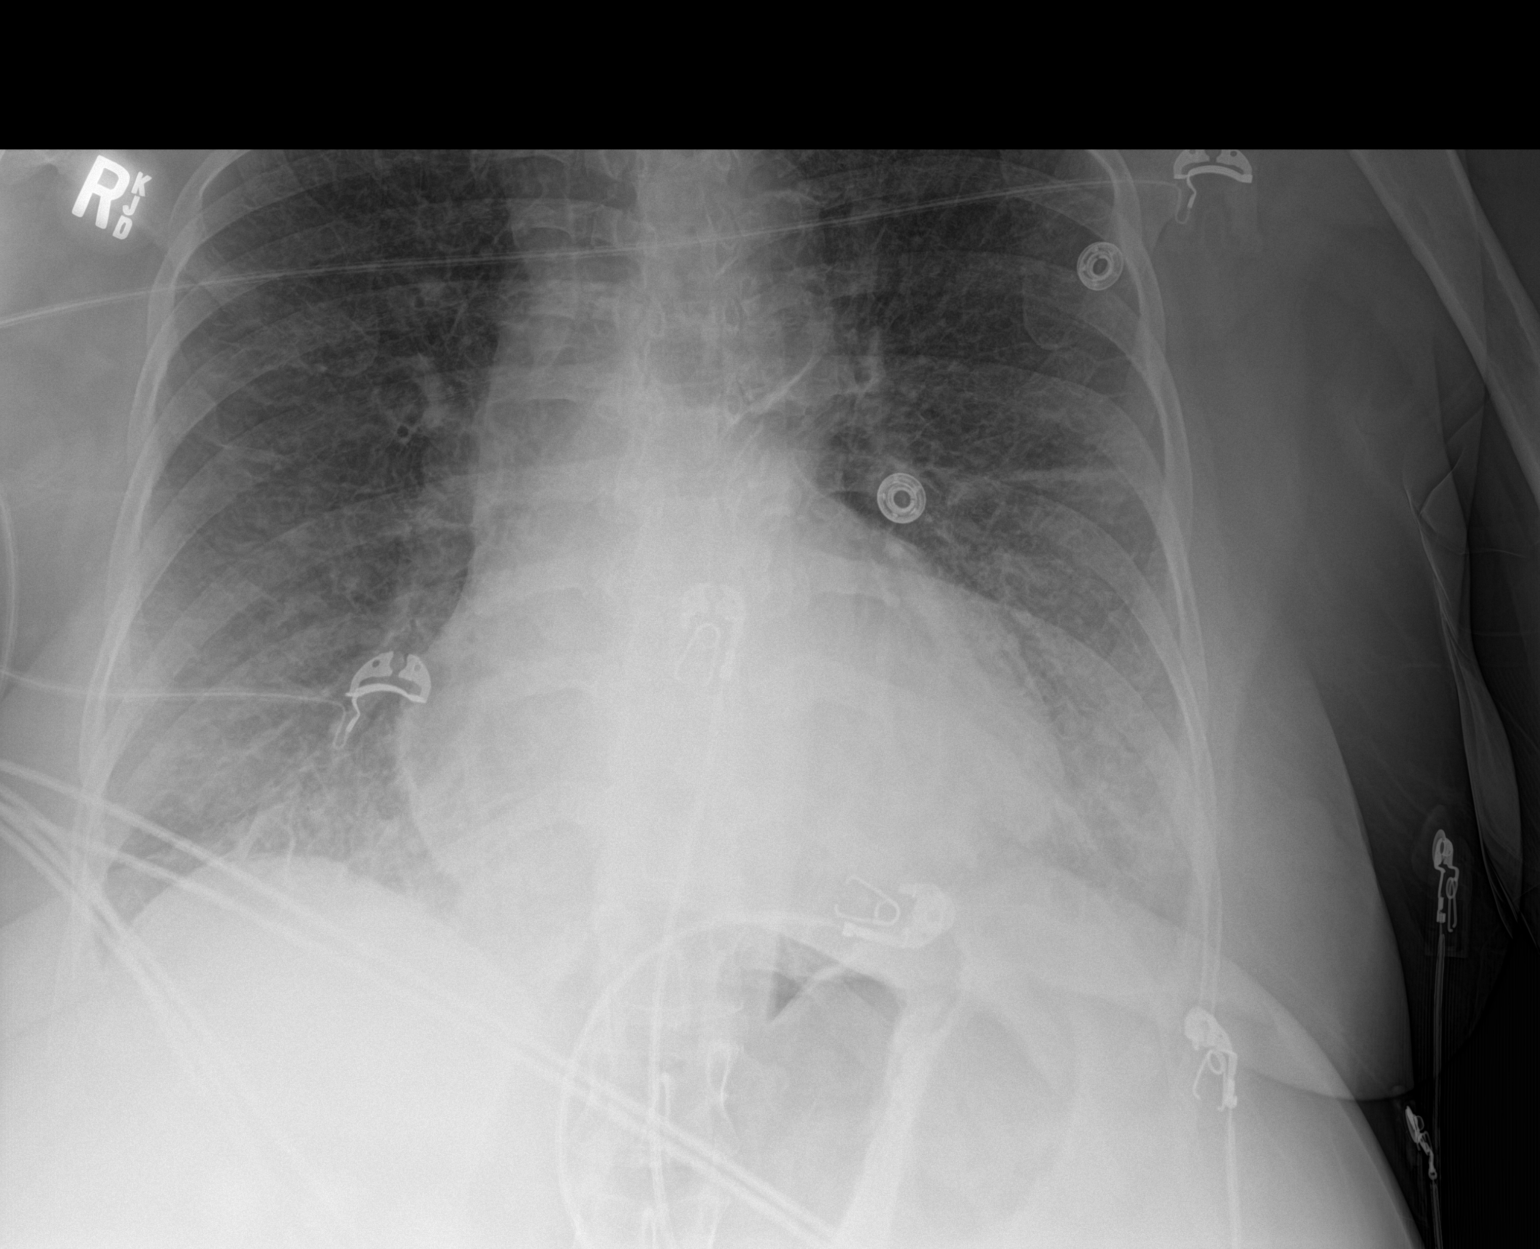

[2 of 2 positions shown; findings below may reference images not displayed]

FINDINGS: Lungs are adequately inflated and demonstrate mild hazy bilateral
perihilar/infrahilar opacification likely mild interstitial edema.
No definite effusion. Mild linear atelectasis left midlung. There is
mild cardiomegaly. Calcified plaque is present over the thoracic
aorta. Remainder of the exam is unchanged.
IMPRESSION: Mild hazy bilateral perihilar/infrahilar opacification likely mild
interstitial edema.

## 2018-11-11 DIAGNOSIS — Z1239 Encounter for other screening for malignant neoplasm of breast: Secondary | ICD-10-CM | POA: Diagnosis not present

## 2018-11-11 DIAGNOSIS — F319 Bipolar disorder, unspecified: Secondary | ICD-10-CM | POA: Diagnosis not present

## 2018-11-11 DIAGNOSIS — Z716 Tobacco abuse counseling: Secondary | ICD-10-CM | POA: Diagnosis not present

## 2018-11-11 DIAGNOSIS — F17210 Nicotine dependence, cigarettes, uncomplicated: Secondary | ICD-10-CM | POA: Diagnosis not present

## 2018-11-11 DIAGNOSIS — I10 Essential (primary) hypertension: Secondary | ICD-10-CM | POA: Diagnosis not present

## 2018-11-11 DIAGNOSIS — J4520 Mild intermittent asthma, uncomplicated: Secondary | ICD-10-CM | POA: Diagnosis not present

## 2018-11-11 DIAGNOSIS — I82409 Acute embolism and thrombosis of unspecified deep veins of unspecified lower extremity: Secondary | ICD-10-CM | POA: Diagnosis not present

## 2018-11-30 ENCOUNTER — Encounter (HOSPITAL_BASED_OUTPATIENT_CLINIC_OR_DEPARTMENT_OTHER): Payer: Self-pay | Admitting: *Deleted

## 2018-12-01 ENCOUNTER — Encounter (HOSPITAL_COMMUNITY): Payer: Self-pay | Admitting: *Deleted

## 2018-12-01 ENCOUNTER — Encounter (HOSPITAL_COMMUNITY): Payer: Self-pay | Admitting: Physician Assistant

## 2018-12-01 ENCOUNTER — Other Ambulatory Visit: Payer: Self-pay

## 2018-12-01 NOTE — Progress Notes (Signed)
Anesthesia Chart Review: SAME DAY WORKUP   Case:  [id number] Date/Time:  12/02/18 1420   Procedure:  DENTAL RESTORATION/EXTRACTIONS (Bilateral )   Anesthesia type:  General   Pre-op diagnosis:  NON-RESTORABLE   Location:  [facility] OR ROOM 05 / [facility]   Surgeon:  Diona Browner, DDS      DISCUSSION: 57 yo female current smoker. Pertinent hx includes DVT (bilateral x 2, on lifelong anticoagulant), PVD (s/p L fem-pop bypass 11/21/08; graft occluded 12/28/08;  aortogram w/ bilat LE runoff: 1.  bilat diffuse SFA occlusive dz, 2.  Mod to severe above-knee popliteal dz,  3.  Bilat 3-vessel runoff w/ mild tibial occlusive dz.), HTN, Chronic pain, HLD, Asthma, Bipolar, HrEF.  Pt was hospitalized 2/17-2/19/2019 for LOC event. Per discharge summary:  "Unclear story of LOC that may have been a syncopal episode. EMS reports were reassuring that it was not a cardiac arrest event. She was nasally intubated by EMS for reports of agonal breathing, subsequently extubated in the ED and then weaned to room air without difficulty. Troponin was only mildley elevated 0.04 and stable, unlikely to have been ACS. She has uncontrolled HTN, was discovered to have HF--this may have been related to pulmonary edema though she did not have significant edema on imaging. She did receive a dose of oral Lasix. CTA chest ruled out PE-did note potential aspirate material though pt never developed recurrent dyspnea or fever. She was monitored with telemetry which did not reveal any cardiac events. While it is difficult to confidently explain this episode, she quickly improved and remained stable."  She was discharged with PCP and cardiology followup. She followed up once with [facility], but did not follow up with cardiology. She subsequently established with Dr. Jeanie Cooks for primary care.   I talked with the patient via phone to clarify her current functional status. She reports her ability to ambulate is very limited by chronic  bilateral LE pain due to hx of multiple DVTs. She uses a rolling walker. Says she can walk several minutes on flat ground before having to stop due to leg pain. She has stairs in her home but has to go up very slowly due to pain. She denies any associated SOB or CP. She says she has some mild chronic LE edema that is controlled with HCTZ, says she used to have to wear TED hose but does not need them anymore.   I discussed case with Dr. Fransisco Beau. He advised that because we really can't get a sense of her functional status due to her limited ability to ambulate it would be best for her to have cardiac eval prior to surgery to establish her current cardiac function and make sure she is optimized regarding her HFrEF. Dr. Lupita Leash scheduler made aware.  VS: LMP 11/22/2015   PROVIDERS: Nolene Ebbs, MD is PCP  LABS: Will need DOS labs.  IMAGES: CTA 02/15/2018: IMPRESSION: Hazy somewhat heterogeneous airspace opacification over the posterior lower lobes left greater than right. Findings may be due to aspiration pneumonia due to evidence of minimal aspirate material over the right mainstem bronchus versus atelectasis or edema. Mild patchy peripheral atelectatic change over the mid to upper lungs.  No evidence of pulmonary embolism.  Evidence of 3 vessel atherosclerotic coronary artery disease.  Aortic Atherosclerosis (ICD10-I70.0).  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 Recommend annual imaging followup by CTA or MRA. This recommendation follows 2010 ACCF/AHA/AATS/ACR/ASA/SCA/SCAI/SIR/STS/SVM Guidelines for the Diagnosis and Management of Patients  with Thoracic Aortic Disease. Circulation.2010; 121: B341-P379.   EKG: 02/15/2018: Sinus tachycardia. Rate 118. Right axis deviation. Consider left ventricular hypertrophy Anterior Q waves, possibly due to LVH. Abnormal T, consider ischemia, diffuse leads.  CV: TTE 02/16/2018: Study Conclusions  - Left  ventricle: The cavity size was normal. There was moderate   concentric hypertrophy. Systolic function was moderately reduced.   The estimated ejection fraction was in the range of 35% to 40%.   Diffuse hypokinesis. Doppler parameters are consistent with   abnormal left ventricular relaxation (grade 1 diastolic   dysfunction). Doppler parameters are consistent with elevated   ventricular end-diastolic filling pressure. - Aortic valve: Trileaflet; normal thickness leaflets. There was no   regurgitation. - Mitral valve: Calcified annulus. Mildly thickened leaflets .   There was mild regurgitation. - Right atrium: The atrium was normal in size. - Tricuspid valve: There was mild regurgitation. - Pulmonary arteries: Systolic pressure was within the normal   range. - Inferior vena cava: The vessel was normal in size. - Pericardium, extracardiac: There was no pericardial effusion.  Cath 02/25/2014: Coronary angiography: Coronary dominance: right  Left mainstem: Normal.   Left anterior descending (LAD): Moderate calcification in the proximal with diffuse 20% disease.  Left circumflex (LCx): Gives rise to 2 large OM branches. Normal.   Right coronary artery (RCA): Normal.  Left ventriculography: Left ventricular systolic function is normal, LVEF is estimated at 55-65%, there is no significant mitral regurgitation   Final Conclusions:   1. Nonobstructive CAD 2. Normal LV function.  Recommendations: risk factor modification. Patient is low risk for planned breast surgery.  Past Medical History:  Diagnosis Date  . Anxiety   . Asthma   . Bipolar disorder ([place])   . Breast lump 02/2008   Biopsy 05/2008: showed no evidence of malignancy  . Breast mass in female    bi lat   . CHF (congestive heart failure) ([place])   . Chronic pain syndrome   . Degenerative joint disease of knee   . Dental caries   . Depression   . 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 Endometrial mass 10/12/2012   Endometrium, biopsy on 11/04/12 - PROLIFERATIVE ENDOMETRIUM AND ABUNDANT MUCUS. NO HYPERPLASIA OR CARCINOMA.   . Fibromyalgia   . Hyperlipidemia   . Hypertension   . Irregular menses 9/08  . Joint pain   . Mixed stress and urge urinary incontinence    [facility] by [facility], underwent cystoscopy & uroflowmetry   . Pulmonary edema   . PVD (peripheral vascular disease) ([place])    s/p L fem-pop bypass 11/21/08; graft occluded 12/28/08;  aortogram w/ bilat LE runoff: 1.  bilat diffuse SFA occlusive dz, 2.  Mod to severe above-knee popliteal dz,  3.  Bilat 3-vessel runoff w/ mild tibial occlusive dz.  . Restless leg syndrome   . Sigmoid diverticulitis 10/26/2012  . Tobacco abuse   . Wears glasses     Past Surgical History:  Procedure Laterality Date  .  Right external iliac artery stent  11/04/2008  . ABDOMINAL AORTAGRAM     11/04/2008; 10/14/2006  . BREAST LUMPECTOMY W/ NEEDLE LOCALIZATION Right 06/13/2008  . BREAST LUMPECTOMY WITH RADIOACTIVE SEED LOCALIZATION Bilateral 05/03/2014   Procedure: BREAST LUMPECTOMY WITH RADIOACTIVE SEED LOCALIZATION;  Surgeon: Joyice Faster. Cornett, MD;  Location: [place];  Service: General;  Laterality: Bilateral;  . COLONOSCOPY WITH PROPOFOL  01/25/2014  . FEMORAL ENDARTERECTOMY Left  11/21/2008  . FEMORAL-POPLITEAL BYPASS GRAFT Left 11/21/2008  . LEFT HEART CATHETERIZATION WITH CORONARY ANGIOGRAM N/A 02/25/2014   Procedure: LEFT HEART CATHETERIZATION WITH CORONARY ANGIOGRAM;  Surgeon: Peter M Martinique, MD;  Location: [facility] CATH LAB;  Service: Cardiovascular;  Laterality: N/A;  . TONSILLECTOMY    . TUBAL LIGATION      MEDICATIONS: No current facility-administered medications for this encounter.    Marland Kitchen albuterol (VENTOLIN HFA) 108 (90 Base) MCG/ACT inhaler  . amLODipine (NORVASC) 5 MG tablet  . aspirin EC 81 MG tablet  . atorvastatin (LIPITOR) 40 MG tablet  . carvedilol (COREG)  12.5 MG tablet  . diazepam (VALIUM) 5 MG tablet  . DULoxetine (CYMBALTA) 60 MG capsule  . escitalopram (LEXAPRO) 10 MG tablet  . gabapentin (NEURONTIN) 800 MG tablet  . hydrochlorothiazide (HYDRODIURIL) 25 MG tablet  . hydrOXYzine (ATARAX/VISTARIL) 50 MG tablet  . lisinopril (PRINIVIL,ZESTRIL) 20 MG tablet  . Lurasidone HCl (LATUDA) 120 MG TABS  . potassium chloride (K-DUR) 10 MEQ tablet  . traZODone (DESYREL) 100 MG tablet  . vitamin E (VITAMIN E) 400 UNIT capsule  . XARELTO 10 MG TABS tablet  . busPIRone (BUSPAR) 10 MG tablet     Wynonia Musty [facility] Short Stay Center/Anesthesiology Phone [phone] 12/01/2018 4:53 PM

## 2018-12-01 NOTE — Progress Notes (Signed)
Pt denies SOB, chest pain, and being under the care of a cardiologist. Pt stated that she has not seen a cardiologist since her last admission at [facility]. Pt stated that she is under the care of Dr. Nolene Ebbs at [facility]; records requested. Pt denies recent labs. Pt stated that last doe of Xarelto was " last Thursday." Pt made aware to stop taking vitamins, fish oil and herbal medications. Do not take any NSAIDs ie: Ibuprofen, Advil, Naproxen (Aleve), Motrin, BC and Goody Powder. Pt verbalized understanding of all pre-op instructions. PA, Anesthesiology, made aware of pt cardiac history.

## 2018-12-02 ENCOUNTER — Ambulatory Visit (HOSPITAL_COMMUNITY): Admission: RE | Admit: 2018-12-02 | Payer: Medicare Other | Source: Ambulatory Visit | Admitting: Oral Surgery

## 2018-12-02 HISTORY — DX: Dental caries, unspecified: K02.9

## 2018-12-02 HISTORY — DX: Presence of spectacles and contact lenses: Z97.3

## 2018-12-02 HISTORY — DX: Heart failure, unspecified: I50.9

## 2018-12-02 HISTORY — DX: Fibromyalgia: M79.7

## 2018-12-02 HISTORY — DX: Bipolar disorder, unspecified: F31.9

## 2018-12-02 SURGERY — DENTAL RESTORATION/EXTRACTIONS
Anesthesia: General | Laterality: Bilateral

## 2018-12-07 DIAGNOSIS — I5022 Chronic systolic (congestive) heart failure: Secondary | ICD-10-CM | POA: Diagnosis not present

## 2018-12-07 DIAGNOSIS — I10 Essential (primary) hypertension: Secondary | ICD-10-CM | POA: Diagnosis not present

## 2018-12-07 DIAGNOSIS — J4520 Mild intermittent asthma, uncomplicated: Secondary | ICD-10-CM | POA: Diagnosis not present

## 2018-12-07 DIAGNOSIS — I82409 Acute embolism and thrombosis of unspecified deep veins of unspecified lower extremity: Secondary | ICD-10-CM | POA: Diagnosis not present

## 2019-01-05 ENCOUNTER — Encounter: Payer: Self-pay | Admitting: Cardiovascular Disease

## 2019-01-06 ENCOUNTER — Ambulatory Visit: Payer: Medicare Other | Admitting: Cardiovascular Disease

## 2019-01-06 ENCOUNTER — Encounter: Payer: Self-pay | Admitting: Cardiovascular Disease

## 2019-01-06 ENCOUNTER — Ambulatory Visit (INDEPENDENT_AMBULATORY_CARE_PROVIDER_SITE_OTHER): Payer: Medicare Other | Admitting: Cardiovascular Disease

## 2019-01-06 VITALS — BP 110/62 | HR 77 | Ht 64.5 in | Wt 202.0 lb

## 2019-01-06 DIAGNOSIS — E782 Mixed hyperlipidemia: Secondary | ICD-10-CM

## 2019-01-06 DIAGNOSIS — I5022 Chronic systolic (congestive) heart failure: Secondary | ICD-10-CM | POA: Diagnosis not present

## 2019-01-06 DIAGNOSIS — I10 Essential (primary) hypertension: Secondary | ICD-10-CM | POA: Diagnosis not present

## 2019-01-06 MED ORDER — LOSARTAN POTASSIUM 100 MG PO TABS: 100.0000 mg | ORAL_TABLET | Freq: Every day | ORAL | 3 refills | Status: DC

## 2019-01-06 NOTE — Progress Notes (Signed)
Cardiology Office Note:    Date:  01/06/2019   ID:  Alisha [name], Alisha [name] [birth year], MRN [id number]  PCP:  Nolene Ebbs, MD  Cardiologist:  Azaliyah Kennard Electrophysiologist:  None   Referring MD: [name]   Chief Complaint  Patient presents with  . Congestive Heart Failure      Jan.  8, 2020   Alisha [name] is a 58 y.o. female with a hx of congestive heart failure.  I saw the patient many years ago.  She was last seen by Truitt Merle in 2015.  At that time she had a heart catheterization which revealed nonobstructive coronary artery disease.  She is been having problems with worsening shortness of breath.  An echocardiogram from February, 2019 reveals an ejection fraction of 35 to 40%.  She has grade 1 diastolic dysfunction.  She was referred by Dr. Jeanie Cooks for further evaluation and management of her congestive heart failure.  She has been having lots o f issues with severe shortness of breath.  These episodes tend tend to occur suddenly.  In February 2019 she wound up in the ICU.     She still eats salt.  Still eats lots of processed foods.   Needs to stop smoking   Has been a year since her last hospitalization   Past Medical History:  Diagnosis Date  . Anxiety   . Asthma   . Bipolar disorder ([place])   . Breast lump 02/2008   Biopsy 05/2008: showed no evidence of malignancy  . Breast mass in female    bi lat   . CHF (congestive heart failure) ([place])   . Chronic pain syndrome   . Degenerative joint disease of knee   . Dental caries   . Depression   . 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 Endometrial mass 10/12/2012   Endometrium, biopsy on 11/04/12 - PROLIFERATIVE ENDOMETRIUM AND ABUNDANT MUCUS. NO HYPERPLASIA OR CARCINOMA.   . Fibromyalgia   . Hyperlipidemia   . Hypertension   . Irregular menses 9/08  . Joint pain   . Mixed stress and urge urinary incontinence    [facility], underwent cystoscopy & uroflowmetry   . Pulmonary edema   . PVD (peripheral vascular disease) ([place])    s/p L fem-pop bypass 11/21/08; graft occluded 12/28/08;  aortogram w/ bilat LE runoff: 1.  bilat diffuse SFA occlusive dz, 2.  Mod to severe above-knee popliteal dz,  3.  Bilat 3-vessel runoff w/ mild tibial occlusive dz.  . Restless leg syndrome   . Sigmoid diverticulitis 10/26/2012  . Tobacco abuse   . Wears glasses     Past Surgical History:  Procedure Laterality Date  .  Right external iliac artery stent  11/04/2008  . ABDOMINAL AORTAGRAM     11/04/2008; 10/14/2006  . BREAST LUMPECTOMY W/ NEEDLE LOCALIZATION Right 06/13/2008  . BREAST LUMPECTOMY WITH RADIOACTIVE SEED LOCALIZATION Bilateral 05/03/2014   Procedure: BREAST LUMPECTOMY WITH RADIOACTIVE SEED LOCALIZATION;  Surgeon: Joyice Faster. Cornett, MD;  Location: [facility];  Service: General;  Laterality: Bilateral;  . COLONOSCOPY WITH PROPOFOL  01/25/2014  . FEMORAL ENDARTERECTOMY Left 11/21/2008  . FEMORAL-POPLITEAL BYPASS GRAFT Left 11/21/2008  . LEFT HEART CATHETERIZATION WITH CORONARY ANGIOGRAM N/A 02/25/2014   Procedure: LEFT HEART CATHETERIZATION WITH CORONARY ANGIOGRAM;  Surgeon: Peter M Martinique, MD;  Location: [facility] CATH LAB;  Service: Cardiovascular;  Laterality: N/A;  . TONSILLECTOMY    .  TUBAL LIGATION      Current Medications: Current Meds  Medication Sig  . albuterol (VENTOLIN HFA) 108 (90 Base) MCG/ACT inhaler Inhale 1-2 puffs into the lungs every 6 (six) hours as needed for wheezing or shortness of breath.  Marland Kitchen aspirin EC 81 MG tablet Take 81 mg by mouth daily.  Marland Kitchen atorvastatin (LIPITOR) 40 MG tablet Take 1 tablet (40 mg total) by mouth at bedtime. (Patient taking differently: Take 40 mg by mouth daily. )  . carvedilol (COREG) 12.5 MG tablet Take 1 tablet (12.5 mg total) by mouth 2 (two) times daily with a meal.  . diazepam (VALIUM) 5 MG tablet Take 5 mg by mouth 3 (three) times daily.   . DULoxetine  (CYMBALTA) 60 MG capsule Take 60 mg by mouth 2 (two) times daily.   Marland Kitchen escitalopram (LEXAPRO) 10 MG tablet Take 10 mg by mouth daily.  Marland Kitchen gabapentin (NEURONTIN) 800 MG tablet Take 800 mg by mouth 4 (four) times daily.  . hydrochlorothiazide (HYDRODIURIL) 25 MG tablet Take 1 tablet (25 mg total) by mouth daily.  . hydrOXYzine (ATARAX/VISTARIL) 50 MG tablet Take 50 mg by mouth 3 (three) times daily as needed for anxiety.  . Lurasidone HCl (LATUDA) 120 MG TABS Take 120 mg by mouth 2 (two) times daily.  . potassium chloride (K-DUR) 10 MEQ tablet Take 10 mEq by mouth daily.  . vitamin E (VITAMIN E) 400 UNIT capsule Take 800 Units by mouth 2 (two) times daily.  Alveda Reasons 10 MG TABS tablet Take 10 mg by mouth daily.  . [DISCONTINUED] amLODipine (NORVASC) 5 MG tablet Take 0.5 tablets (2.5 mg total) by mouth daily.  . [DISCONTINUED] lisinopril (PRINIVIL,ZESTRIL) 20 MG tablet Take 1 tablet (20 mg total) by mouth daily.     Allergies:   Propoxyphene n-acetaminophen; Buspar [buspirone]; Grapefruit extract; and Strawberry extract   Social History   Socioeconomic History  . Marital status: Divorced    Spouse name: Not on file  . Number of children: 4  . Years of education: Not on file  . Highest education level: Not on file  Occupational History  . Occupation: disable    Employer: UNEMPLOYED  Social Needs  . Financial resource strain: Not on file  . Food insecurity:    Worry: Not on file    Inability: Not on file  . Transportation needs:    Medical: Not on file    Non-medical: Not on file  Tobacco Use  . Smoking status: Current Every Day Smoker    Packs/day: 0.50    Years: 18.00    Pack years: 9.00    Types: Cigarettes  . Smokeless tobacco: Never Used  . Tobacco comment: 1pk 1/2 PACK DAY 2-3 0r none  Substance and Sexual Activity  . Alcohol use: Not Currently    Alcohol/week: 0.0 standard drinks  . Drug use: No    Comment: hx of marijuana and cocaine use; uses Suboxone patch  . Sexual  activity: Never  Lifestyle  . Physical activity:    Days per week: Not on file    Minutes per session: Not on file  . Stress: Not on file  Relationships  . Social connections:    Talks on phone: Not on file    Gets together: Not on file    Attends religious service: Not on file    Active member of club or organization: Not on file    Attends meetings of clubs or organizations: Not on file    Relationship status:  Not on file  Other Topics Concern  . Not on file  Social History Narrative   Financial assistance application initiated. Patient needs to submit further paperwork to complete   [facility]  September 11, 2010 2:04 PM   Financial assistance approved for [facility]% discount [facility] and has [facility] card   [name]  October 04, 2010 5:29 PM     Family History: The patient's family history includes Breast cancer in her mother and another family member; Depression in her mother and sister; Hyperlipidemia in her father, mother, and sister; Mental retardation in her daughter, father, mother, and sister.  ROS:      EKGs/Labs/Other Studies Reviewed:    The following studies were reviewed today:   EKG:   January 06, 2019: Normal sinus rhythm at 73.  Poor R wave progression.  Recent Labs: 02/15/2018: ALT 31; B Natriuretic Peptide 132.0; Hemoglobin 17.7; Platelets 231 02/16/2018: BUN 13; Creatinine, Ser 1.04; Potassium 4.2; Sodium 141  Recent Lipid Panel    Component Value Date/Time   CHOL 162 10/01/2011 1012   TRIG 205 (H) 10/01/2011 1012   HDL 25 (L) 10/01/2011 1012   CHOLHDL 6.5 10/01/2011 1012   VLDL 41 (H) 10/01/2011 1012   LDLCALC 96 10/01/2011 1012    Physical Exam:    VS:  BP 110/62   Pulse 77   Ht 5' 4.5" (1.638 m)   Wt 202 lb (91.6 kg)   LMP 11/22/2015   SpO2 97%   BMI 34.14 kg/m     Wt Readings from Last 3 Encounters:  01/06/19 202 lb (91.6 kg)  02/27/18 214 lb (97.1 kg)  02/17/18 214 lb 3.2 oz (97.2 kg)     GEN: Middle-aged female, no acute  distress HEENT: Normal NECK: No JVD; No carotid bruits LYMPHATICS: No lymphadenopathy CARDIAC: Regular rate S1-S2. RESPIRATORY:  Clear to auscultation without rales, wheezing or rhonchi  ABDOMEN: Soft, non-tender, non-distended MUSCULOSKELETAL:  No edema; No deformity  SKIN: Warm and dry NEUROLOGIC:  Alert and oriented x 3 PSYCHIATRIC:  Normal affect   ASSESSMENT:    1. Chronic systolic heart failure ([place])   2. HYPERLIPIDEMIA, MIXED   3. Essential hypertension    PLAN:    In order of problems listed above:  1. Chronic systolic congestive heart failure: The patient presents for further evaluation management for her chronic systolic congestive heart failure at the request of Nolene Ebbs, MD Blood pressures better.  She still eats quite a bit of extra salt that she does not need.  She she admits to eating lots of processed foods.   She is on amlodipine which is not effective in congestive heart failure.  We will discontinue the amlodipine and the lisinopril and start her on losartan 100 mg a day. Continue HCTZ for now.  We will get an echocardiogram in several weeks for follow-up evaluation.  We will check a basic metabolic profile at that time.  Continue carvedilol. See her in 3 months for follow-up visit.   Medication Adjustments/Labs and Tests Ordered: Current medicines are reviewed at length with the patient today.  Concerns regarding medicines are outlined above.  Orders Placed This Encounter  Procedures  . Basic metabolic panel  . EKG 12-Lead  . ECHOCARDIOGRAM COMPLETE   Meds ordered this encounter  Medications  . losartan (COZAAR) 100 MG tablet    Sig: Take 1 tablet (100 mg total) by mouth daily.    Dispense:  90 tablet    Refill:  3  Patient Instructions  Medication Instructions:  1) STOP AMLODIPINE 2) STOP LISINOPRIL 3) START LOSARTAN 100 mg daily  Labwork: You will have lab work when you return for your echocardiogram. You do not need to be  fasting.  Testing/Procedures: Your provider has requested that you have an echocardiogram. Echocardiography is a painless test that uses sound waves to create images of your heart. It provides your doctor with information about the size and shape of your heart and how well your heart's chambers and valves are working. This procedure takes approximately one hour. There are no restrictions for this procedure. You are scheduled for your echocardiogram on February 03, 2019. Please arrive to our office by 10:00AM.  Follow-Up: You have an appointment with Dr. Acie Fredrickson scheduled April 12, 2019 at 11:00AM.  Any Other Special Instructions Will Be Listed Below (If Applicable).  Low-Sodium Eating Plan Sodium, which is an element that makes up salt, helps you maintain a healthy balance of fluids in your body. Too much sodium can increase your blood pressure and cause fluid and waste to be held in your body. Your health care provider or dietitian may recommend following this plan if you have high blood pressure (hypertension), kidney disease, liver disease, or heart failure. Eating less sodium can help lower your blood pressure, reduce swelling, and protect your heart, liver, and kidneys. What are tips for following this plan? General guidelines  Most people on this plan should limit their sodium intake to 1,500-2,000 mg (milligrams) of sodium each day. Reading food labels   The Nutrition Facts label lists the amount of sodium in one serving of the food. If you eat more than one serving, you must multiply the listed amount of sodium by the number of servings.  Choose foods with less than 140 mg of sodium per serving.  Avoid foods with 300 mg of sodium or more per serving. Shopping  Look for lower-sodium products, often labeled as "low-sodium" or "no salt added."  Always check the sodium content even if foods are labeled as "unsalted" or "no salt added".  Buy fresh foods. ? Avoid canned foods and  premade or frozen meals. ? Avoid canned, cured, or processed meats  Buy breads that have less than 80 mg of sodium per slice. Cooking  Eat more home-cooked food and less restaurant, buffet, and fast food.  Avoid adding salt when cooking. Use salt-free seasonings or herbs instead of table salt or sea salt. Check with your health care provider or pharmacist before using salt substitutes.  Cook with plant-based oils, such as canola, sunflower, or olive oil. Meal planning  When eating at a restaurant, ask that your food be prepared with less salt or no salt, if possible.  Avoid foods that contain MSG (monosodium glutamate). MSG is sometimes added to [place] food, bouillon, and some canned foods. What foods are recommended? The items listed may not be a complete list. Talk with your dietitian about what dietary choices are best for you. Grains Low-sodium cereals, including oats, puffed wheat and rice, and shredded wheat. Low-sodium crackers. Unsalted rice. Unsalted pasta. Low-sodium bread. Whole-grain breads and whole-grain pasta. Vegetables Fresh or frozen vegetables. "No salt added" canned vegetables. "No salt added" tomato sauce and paste. Low-sodium or reduced-sodium tomato and vegetable juice. Fruits Fresh, frozen, or canned fruit. Fruit juice. Meats and other protein foods Fresh or frozen (no salt added) meat, poultry, seafood, and fish. Low-sodium canned tuna and salmon. Unsalted nuts. Dried peas, beans, and lentils without added salt. Unsalted canned  beans. Eggs. Unsalted nut butters. Dairy Milk. Soy milk. Cheese that is naturally low in sodium, such as ricotta cheese, fresh mozzarella, or Swiss cheese Low-sodium or reduced-sodium cheese. Cream cheese. Yogurt. Fats and oils Unsalted butter. Unsalted margarine with no trans fat. Vegetable oils such as canola or olive oils. Seasonings and other foods Fresh and dried herbs and spices. Salt-free seasonings. Low-sodium mustard and  ketchup. Sodium-free salad dressing. Sodium-free light mayonnaise. Fresh or refrigerated horseradish. Lemon juice. Vinegar. Homemade, reduced-sodium, or low-sodium soups. Unsalted popcorn and pretzels. Low-salt or salt-free chips. What foods are not recommended? The items listed may not be a complete list. Talk with your dietitian about what dietary choices are best for you. Grains Instant hot cereals. Bread stuffing, pancake, and biscuit mixes. Croutons. Seasoned rice or pasta mixes. Noodle soup cups. Boxed or frozen macaroni and cheese. Regular salted crackers. Self-rising flour. Vegetables Sauerkraut, pickled vegetables, and relishes. Olives. [place] fries. Onion rings. Regular canned vegetables (not low-sodium or reduced-sodium). Regular canned tomato sauce and paste (not low-sodium or reduced-sodium). Regular tomato and vegetable juice (not low-sodium or reduced-sodium). Frozen vegetables in sauces. Meats and other protein foods Meat or fish that is salted, canned, smoked, spiced, or pickled. Bacon, ham, sausage, hotdogs, corned beef, chipped beef, packaged lunch meats, salt pork, jerky, pickled herring, anchovies, regular canned tuna, sardines, salted nuts. Dairy Processed cheese and cheese spreads. Cheese curds. Blue cheese. Feta cheese. String cheese. Regular cottage cheese. Buttermilk. Canned milk. Fats and oils Salted butter. Regular margarine. Ghee. Bacon fat. Seasonings and other foods Onion salt, garlic salt, seasoned salt, table salt, and sea salt. Canned and packaged gravies. Worcestershire sauce. Tartar sauce. Barbecue sauce. Teriyaki sauce. Soy sauce, including reduced-sodium. Steak sauce. Fish sauce. Oyster sauce. Cocktail sauce. Horseradish that you find on the shelf. Regular ketchup and mustard. Meat flavorings and tenderizers. Bouillon cubes. Hot sauce and Tabasco sauce. Premade or packaged marinades. Premade or packaged taco seasonings. Relishes. Regular salad dressings. Salsa.  Potato and tortilla chips. Corn chips and puffs. Salted popcorn and pretzels. Canned or dried soups. Pizza. Frozen entrees and pot pies. Summary  Eating less sodium can help lower your blood pressure, reduce swelling, and protect your heart, liver, and kidneys.  Most people on this plan should limit their sodium intake to 1,500-2,000 mg (milligrams) of sodium each day.  Canned, boxed, and frozen foods are high in sodium. Restaurant foods, fast foods, and pizza are also very high in sodium. You also get sodium by adding salt to food.  Try to cook at home, eat more fresh fruits and vegetables, and eat less fast food, canned, processed, or prepared foods. This information is not intended to replace advice given to you by your health care provider. Make sure you discuss any questions you have with your health care provider. Document Released: 06/07/2002 Document Revised: 12/09/2016 Document Reviewed: 12/09/2016 Elsevier Interactive Patient Education  2019 [place], Mertie Moores, MD  01/06/2019 1:36 PM    [place]

## 2019-01-06 NOTE — Patient Instructions (Signed)
Medication Instructions:  1) STOP AMLODIPINE 2) STOP LISINOPRIL 3) START LOSARTAN 100 mg daily  Labwork: You will have lab work when you return for your echocardiogram. You do not need to be fasting.  Testing/Procedures: Your provider has requested that you have an echocardiogram. Echocardiography is a painless test that uses sound waves to create images of your heart. It provides your doctor with information about the size and shape of your heart and how well your heart's chambers and valves are working. This procedure takes approximately one hour. There are no restrictions for this procedure. You are scheduled for your echocardiogram on February 03, 2019. Please arrive to our office by 10:00AM.  Follow-Up: You have an appointment with Dr. Acie Fredrickson scheduled April 12, 2019 at 11:00AM.  Any Other Special Instructions Will Be Listed Below (If Applicable).  Low-Sodium Eating Plan Sodium, which is an element that makes up salt, helps you maintain a healthy balance of fluids in your body. Too much sodium can increase your blood pressure and cause fluid and waste to be held in your body. Your health care provider or dietitian may recommend following this plan if you have high blood pressure (hypertension), kidney disease, liver disease, or heart failure. Eating less sodium can help lower your blood pressure, reduce swelling, and protect your heart, liver, and kidneys. What are tips for following this plan? General guidelines  Most people on this plan should limit their sodium intake to 1,500-2,000 mg (milligrams) of sodium each day. Reading food labels   The Nutrition Facts label lists the amount of sodium in one serving of the food. If you eat more than one serving, you must multiply the listed amount of sodium by the number of servings.  Choose foods with less than 140 mg of sodium per serving.  Avoid foods with 300 mg of sodium or more per serving. Shopping  Look for lower-sodium  products, often labeled as "low-sodium" or "no salt added."  Always check the sodium content even if foods are labeled as "unsalted" or "no salt added".  Buy fresh foods. ? Avoid canned foods and premade or frozen meals. ? Avoid canned, cured, or processed meats  Buy breads that have less than 80 mg of sodium per slice. Cooking  Eat more home-cooked food and less restaurant, buffet, and fast food.  Avoid adding salt when cooking. Use salt-free seasonings or herbs instead of table salt or sea salt. Check with your health care provider or pharmacist before using salt substitutes.  Cook with plant-based oils, such as canola, sunflower, or olive oil. Meal planning  When eating at a restaurant, ask that your food be prepared with less salt or no salt, if possible.  Avoid foods that contain MSG (monosodium glutamate). MSG is sometimes added to [place] food, bouillon, and some canned foods. What foods are recommended? The items listed may not be a complete list. Talk with your dietitian about what dietary choices are best for you. Grains Low-sodium cereals, including oats, puffed wheat and rice, and shredded wheat. Low-sodium crackers. Unsalted rice. Unsalted pasta. Low-sodium bread. Whole-grain breads and whole-grain pasta. Vegetables Fresh or frozen vegetables. "No salt added" canned vegetables. "No salt added" tomato sauce and paste. Low-sodium or reduced-sodium tomato and vegetable juice. Fruits Fresh, frozen, or canned fruit. Fruit juice. Meats and other protein foods Fresh or frozen (no salt added) meat, poultry, seafood, and fish. Low-sodium canned tuna and salmon. Unsalted nuts. Dried peas, beans, and lentils without added salt. Unsalted canned beans. Eggs. Unsalted nut  butters. Dairy Milk. Soy milk. Cheese that is naturally low in sodium, such as ricotta cheese, fresh mozzarella, or Swiss cheese Low-sodium or reduced-sodium cheese. Cream cheese. Yogurt. Fats and oils Unsalted  butter. Unsalted margarine with no trans fat. Vegetable oils such as canola or olive oils. Seasonings and other foods Fresh and dried herbs and spices. Salt-free seasonings. Low-sodium mustard and ketchup. Sodium-free salad dressing. Sodium-free light mayonnaise. Fresh or refrigerated horseradish. Lemon juice. Vinegar. Homemade, reduced-sodium, or low-sodium soups. Unsalted popcorn and pretzels. Low-salt or salt-free chips. What foods are not recommended? The items listed may not be a complete list. Talk with your dietitian about what dietary choices are best for you. Grains Instant hot cereals. Bread stuffing, pancake, and biscuit mixes. Croutons. Seasoned rice or pasta mixes. Noodle soup cups. Boxed or frozen macaroni and cheese. Regular salted crackers. Self-rising flour. Vegetables Sauerkraut, pickled vegetables, and relishes. Olives. [place] fries. Onion rings. Regular canned vegetables (not low-sodium or reduced-sodium). Regular canned tomato sauce and paste (not low-sodium or reduced-sodium). Regular tomato and vegetable juice (not low-sodium or reduced-sodium). Frozen vegetables in sauces. Meats and other protein foods Meat or fish that is salted, canned, smoked, spiced, or pickled. Bacon, ham, sausage, hotdogs, corned beef, chipped beef, packaged lunch meats, salt pork, jerky, pickled herring, anchovies, regular canned tuna, sardines, salted nuts. Dairy Processed cheese and cheese spreads. Cheese curds. Blue cheese. Feta cheese. String cheese. Regular cottage cheese. Buttermilk. Canned milk. Fats and oils Salted butter. Regular margarine. Ghee. Bacon fat. Seasonings and other foods Onion salt, garlic salt, seasoned salt, table salt, and sea salt. Canned and packaged gravies. Worcestershire sauce. Tartar sauce. Barbecue sauce. Teriyaki sauce. Soy sauce, including reduced-sodium. Steak sauce. Fish sauce. Oyster sauce. Cocktail sauce. Horseradish that you find on the shelf. Regular ketchup and  mustard. Meat flavorings and tenderizers. Bouillon cubes. Hot sauce and Tabasco sauce. Premade or packaged marinades. Premade or packaged taco seasonings. Relishes. Regular salad dressings. Salsa. Potato and tortilla chips. Corn chips and puffs. Salted popcorn and pretzels. Canned or dried soups. Pizza. Frozen entrees and pot pies. Summary  Eating less sodium can help lower your blood pressure, reduce swelling, and protect your heart, liver, and kidneys.  Most people on this plan should limit their sodium intake to 1,500-2,000 mg (milligrams) of sodium each day.  Canned, boxed, and frozen foods are high in sodium. Restaurant foods, fast foods, and pizza are also very high in sodium. You also get sodium by adding salt to food.  Try to cook at home, eat more fresh fruits and vegetables, and eat less fast food, canned, processed, or prepared foods. This information is not intended to replace advice given to you by your health care provider. Make sure you discuss any questions you have with your health care provider. Document Released: 06/07/2002 Document Revised: 12/09/2016 Document Reviewed: 12/09/2016 Elsevier Interactive Patient Education  2019 [employer].

## 2019-01-07 ENCOUNTER — Telehealth: Payer: Self-pay | Admitting: Cardiovascular Disease

## 2019-01-07 NOTE — Telephone Encounter (Signed)
error 

## 2019-01-08 ENCOUNTER — Telehealth: Payer: Self-pay | Admitting: Cardiovascular Disease

## 2019-01-08 NOTE — Telephone Encounter (Signed)
   Primary Cardiologist: Mertie Moores, MD  What kind of extraction, and what kind of anesthesia? Need more info. Dayna Dunn PA-C   Charlie Pitter, PA-C 01/08/2019, 4:43 PM

## 2019-01-08 NOTE — Telephone Encounter (Signed)
New Message       [facility] Pre-operative Risk Assessment    Request for surgical clearance:  1. What type of surgery is being performed? Extraction  2. When is this surgery scheduled? TBD  3. What type of clearance is required (medical clearance vs. Pharmacy clearance to hold med vs. Both)? Medical clearance  4. Are there any medications that need to be held prior to surgery and how long? N/A    5. Practice name and name of physician performing surgery? Dr. Nicki [facility]aper [facility]   6. What is your office phone number [phone]   7.   What is your office fax number [phone]  8.   Anesthesia type (None, local, MAC, general) ? Not sure but she states it may be local   Andree Coss 01/08/2019, 10:23 AM  _________________________________________________________________   (provider comments below)

## 2019-01-11 DIAGNOSIS — I7389 Other specified peripheral vascular diseases: Secondary | ICD-10-CM | POA: Diagnosis not present

## 2019-01-11 DIAGNOSIS — Z716 Tobacco abuse counseling: Secondary | ICD-10-CM | POA: Diagnosis not present

## 2019-01-11 DIAGNOSIS — I10 Essential (primary) hypertension: Secondary | ICD-10-CM | POA: Diagnosis not present

## 2019-01-11 DIAGNOSIS — F17210 Nicotine dependence, cigarettes, uncomplicated: Secondary | ICD-10-CM | POA: Diagnosis not present

## 2019-01-11 DIAGNOSIS — I5022 Chronic systolic (congestive) heart failure: Secondary | ICD-10-CM | POA: Diagnosis not present

## 2019-01-11 DIAGNOSIS — J4520 Mild intermittent asthma, uncomplicated: Secondary | ICD-10-CM | POA: Diagnosis not present

## 2019-01-11 NOTE — Telephone Encounter (Signed)
   Primary Cardiologist: Mertie Moores, MD  Chart reviewed as part of pre-operative protocol coverage. Patient was contacted 01/11/2019 in reference to pre-operative risk assessment for pending surgery as outlined below.  Alisha [name] was just seen on 01/06/2019 by Dr. Acie Fredrickson with plans for CHF med titration. Complex PMH with , PVD s/p fem-pop bypass, DVT, depression, tobacco abuse, substance abuse, chronic pain, chronic systolic CHF, thoracic aortic ectasia (3.5cm in 01/2018), 3V coronary calcification on CT 01/2018 (nonobstructive CAD on cath 2015).   She appears to be on Xarelto maintenance dose but this is not through our office. She is on ASA as well.  Will ask Dr.Nahser to comment on clearance for 14 extractions (done under general / iv sedation) and ASA if needed (not indicated on clearance request). Please route response to [facility] PREOP (the pre-op pool). Thank you.   Charlie Pitter, PA-C 01/11/2019, 3:48 PM

## 2019-01-11 NOTE — Telephone Encounter (Signed)
Called Dr. Parke Simmers office and spoke with [facility]. Pt is having 14 extractions done and it will be under general / iv sedation, depends on the clearance.

## 2019-01-11 NOTE — Telephone Encounter (Signed)
   Primary Cardiologist: Mertie Moores, MD  Chart reviewed as part of pre-operative protocol coverage.   Although blood thinners were not explicitly stated as needed to be held, suspect they will given number of extractions. Chart sent to Dr. Acie Fredrickson for review. If blood thinners are needing to be held, Dr. Acie Fredrickson states, "Pt is at low risk for dental extractions. She may hold her ASA for 7 days. Would prefer that she get advice about her Xarelto through her primary MD since he is managing this."  I will route this recommendation to the requesting party via [place] fax function and remove from pre-op pool.  Please call with questions.  Charlie Pitter, PA-C 01/11/2019, 4:55 PM

## 2019-01-11 NOTE — Telephone Encounter (Signed)
Pt is at low risk for dental extractions She may hold her ASA for 7 days  Would prefer that she get advice about her Xarelto through her primary MD since he is managing this   Thanks

## 2019-02-03 ENCOUNTER — Other Ambulatory Visit: Payer: Medicare Other

## 2019-02-03 ENCOUNTER — Other Ambulatory Visit (HOSPITAL_COMMUNITY): Payer: Medicare Other

## 2019-02-11 ENCOUNTER — Other Ambulatory Visit: Payer: Medicare Other

## 2019-02-11 ENCOUNTER — Other Ambulatory Visit (HOSPITAL_COMMUNITY): Payer: Medicare Other

## 2019-02-14 ENCOUNTER — Other Ambulatory Visit: Payer: Self-pay

## 2019-02-14 ENCOUNTER — Inpatient Hospital Stay (HOSPITAL_COMMUNITY)
Admission: EM | Admit: 2019-02-14 | Discharge: 2019-02-19 | DRG: 240 | Disposition: A | Discharge status: Skilled Nursing Facility | Payer: Medicare Other | Attending: Vascular Surgery | Admitting: Vascular Surgery

## 2019-02-14 ENCOUNTER — Encounter (HOSPITAL_COMMUNITY): Payer: Self-pay | Admitting: Emergency Medicine

## 2019-02-14 ENCOUNTER — Emergency Department (HOSPITAL_COMMUNITY): Payer: Medicare Other

## 2019-02-14 ENCOUNTER — Inpatient Hospital Stay (HOSPITAL_COMMUNITY): Payer: Medicare Other

## 2019-02-14 ENCOUNTER — Encounter

## 2019-02-14 DIAGNOSIS — Z95828 Presence of other vascular implants and grafts: Secondary | ICD-10-CM | POA: Diagnosis not present

## 2019-02-14 DIAGNOSIS — Z4789 Encounter for other orthopedic aftercare: Secondary | ICD-10-CM | POA: Diagnosis not present

## 2019-02-14 DIAGNOSIS — G2581 Restless legs syndrome: Secondary | ICD-10-CM | POA: Diagnosis present

## 2019-02-14 DIAGNOSIS — I509 Heart failure, unspecified: Secondary | ICD-10-CM | POA: Diagnosis present

## 2019-02-14 DIAGNOSIS — J45909 Unspecified asthma, uncomplicated: Secondary | ICD-10-CM | POA: Diagnosis present

## 2019-02-14 DIAGNOSIS — Z86718 Personal history of other venous thrombosis and embolism: Secondary | ICD-10-CM | POA: Diagnosis not present

## 2019-02-14 DIAGNOSIS — E782 Mixed hyperlipidemia: Secondary | ICD-10-CM | POA: Diagnosis present

## 2019-02-14 DIAGNOSIS — Z01810 Encounter for preprocedural cardiovascular examination: Secondary | ICD-10-CM

## 2019-02-14 DIAGNOSIS — M2550 Pain in unspecified joint: Secondary | ICD-10-CM | POA: Diagnosis not present

## 2019-02-14 DIAGNOSIS — Z7901 Long term (current) use of anticoagulants: Secondary | ICD-10-CM

## 2019-02-14 DIAGNOSIS — L97929 Non-pressure chronic ulcer of unspecified part of left lower leg with unspecified severity: Secondary | ICD-10-CM | POA: Diagnosis not present

## 2019-02-14 DIAGNOSIS — Z885 Allergy status to narcotic agent status: Secondary | ICD-10-CM

## 2019-02-14 DIAGNOSIS — R000 Tachycardia, unspecified: Secondary | ICD-10-CM | POA: Diagnosis not present

## 2019-02-14 DIAGNOSIS — T80818A Extravasation of other vesicant agent, initial encounter: Secondary | ICD-10-CM | POA: Diagnosis not present

## 2019-02-14 DIAGNOSIS — Y92238 Other place in hospital as the place of occurrence of the external cause: Secondary | ICD-10-CM | POA: Diagnosis not present

## 2019-02-14 DIAGNOSIS — M79672 Pain in left foot: Secondary | ICD-10-CM | POA: Diagnosis not present

## 2019-02-14 DIAGNOSIS — Z7401 Bed confinement status: Secondary | ICD-10-CM | POA: Diagnosis not present

## 2019-02-14 DIAGNOSIS — M62272 Nontraumatic ischemic infarction of muscle, left ankle and foot: Secondary | ICD-10-CM | POA: Diagnosis not present

## 2019-02-14 DIAGNOSIS — I70262 Atherosclerosis of native arteries of extremities with gangrene, left leg: Principal | ICD-10-CM | POA: Diagnosis present

## 2019-02-14 DIAGNOSIS — I110 Hypertensive heart disease with heart failure: Secondary | ICD-10-CM | POA: Diagnosis present

## 2019-02-14 DIAGNOSIS — M170 Bilateral primary osteoarthritis of knee: Secondary | ICD-10-CM | POA: Diagnosis present

## 2019-02-14 DIAGNOSIS — F329 Major depressive disorder, single episode, unspecified: Secondary | ICD-10-CM | POA: Diagnosis not present

## 2019-02-14 DIAGNOSIS — M6281 Muscle weakness (generalized): Secondary | ICD-10-CM | POA: Diagnosis not present

## 2019-02-14 DIAGNOSIS — R531 Weakness: Secondary | ICD-10-CM | POA: Diagnosis not present

## 2019-02-14 DIAGNOSIS — I4581 Long QT syndrome: Secondary | ICD-10-CM | POA: Diagnosis present

## 2019-02-14 DIAGNOSIS — I700 Atherosclerosis of aorta: Secondary | ICD-10-CM | POA: Diagnosis not present

## 2019-02-14 DIAGNOSIS — Z818 Family history of other mental and behavioral disorders: Secondary | ICD-10-CM

## 2019-02-14 DIAGNOSIS — M797 Fibromyalgia: Secondary | ICD-10-CM | POA: Diagnosis not present

## 2019-02-14 DIAGNOSIS — Z8349 Family history of other endocrine, nutritional and metabolic diseases: Secondary | ICD-10-CM

## 2019-02-14 DIAGNOSIS — Z91128 Patient's intentional underdosing of medication regimen for other reason: Secondary | ICD-10-CM | POA: Diagnosis not present

## 2019-02-14 DIAGNOSIS — I10 Essential (primary) hypertension: Secondary | ICD-10-CM | POA: Diagnosis not present

## 2019-02-14 DIAGNOSIS — R52 Pain, unspecified: Secondary | ICD-10-CM | POA: Diagnosis not present

## 2019-02-14 DIAGNOSIS — Z810 Family history of intellectual disabilities: Secondary | ICD-10-CM

## 2019-02-14 DIAGNOSIS — I739 Peripheral vascular disease, unspecified: Secondary | ICD-10-CM | POA: Diagnosis not present

## 2019-02-14 DIAGNOSIS — I998 Other disorder of circulatory system: Secondary | ICD-10-CM

## 2019-02-14 DIAGNOSIS — F419 Anxiety disorder, unspecified: Secondary | ICD-10-CM | POA: Diagnosis present

## 2019-02-14 DIAGNOSIS — I96 Gangrene, not elsewhere classified: Secondary | ICD-10-CM | POA: Diagnosis present

## 2019-02-14 DIAGNOSIS — G894 Chronic pain syndrome: Secondary | ICD-10-CM | POA: Diagnosis present

## 2019-02-14 DIAGNOSIS — I8290 Acute embolism and thrombosis of unspecified vein: Secondary | ICD-10-CM | POA: Diagnosis not present

## 2019-02-14 DIAGNOSIS — Z7982 Long term (current) use of aspirin: Secondary | ICD-10-CM | POA: Diagnosis not present

## 2019-02-14 DIAGNOSIS — F17210 Nicotine dependence, cigarettes, uncomplicated: Secondary | ICD-10-CM | POA: Diagnosis present

## 2019-02-14 DIAGNOSIS — Z888 Allergy status to other drugs, medicaments and biological substances status: Secondary | ICD-10-CM

## 2019-02-14 DIAGNOSIS — Y842 Radiological procedure and radiotherapy as the cause of abnormal reaction of the patient, or of later complication, without mention of misadventure at the time of the procedure: Secondary | ICD-10-CM | POA: Diagnosis not present

## 2019-02-14 DIAGNOSIS — F319 Bipolar disorder, unspecified: Secondary | ICD-10-CM | POA: Diagnosis present

## 2019-02-14 DIAGNOSIS — Z89612 Acquired absence of left leg above knee: Secondary | ICD-10-CM | POA: Diagnosis not present

## 2019-02-14 DIAGNOSIS — Z79899 Other long term (current) drug therapy: Secondary | ICD-10-CM

## 2019-02-14 DIAGNOSIS — T45516A Underdosing of anticoagulants, initial encounter: Secondary | ICD-10-CM | POA: Diagnosis present

## 2019-02-14 DIAGNOSIS — R5381 Other malaise: Secondary | ICD-10-CM | POA: Diagnosis not present

## 2019-02-14 DIAGNOSIS — Z01818 Encounter for other preprocedural examination: Secondary | ICD-10-CM | POA: Diagnosis not present

## 2019-02-14 DIAGNOSIS — Z4781 Encounter for orthopedic aftercare following surgical amputation: Secondary | ICD-10-CM | POA: Diagnosis not present

## 2019-02-14 LAB — COMPREHENSIVE METABOLIC PANEL
ALT: 33 U/L (ref 0–44)
AST: 51 U/L — ABNORMAL HIGH (ref 15–41)
Albumin: 3.9 g/dL (ref 3.5–5.0)
Alkaline Phosphatase: 71 U/L (ref 38–126)
Anion gap: 14 (ref 5–15)
BUN: 19 mg/dL (ref 6–20)
CALCIUM: 9.9 mg/dL (ref 8.9–10.3)
CO2: 24 mmol/L (ref 22–32)
Chloride: 99 mmol/L (ref 98–111)
Creatinine, Ser: 1.01 mg/dL — ABNORMAL HIGH (ref 0.44–1.00)
GFR calc Af Amer: >60 mL/min (ref >60)
GFR calc non Af Amer: >60 mL/min (ref >60)
Glucose, Bld: 134 mg/dL — ABNORMAL HIGH (ref 70–99)
Potassium: 3.2 mmol/L — ABNORMAL LOW (ref 3.5–5.1)
SODIUM: 137 mmol/L (ref 135–145)
Total Bilirubin: 1.2 mg/dL (ref 0.3–1.2)
Total Protein: 7.6 g/dL (ref 6.5–8.1)

## 2019-02-14 LAB — CBC
HCT: 48.7 % — ABNORMAL HIGH (ref 36.0–46.0)
Hemoglobin: 15.8 g/dL — ABNORMAL HIGH (ref 12.0–15.0)
MCH: 30.8 pg (ref 26.0–34.0)
MCHC: 32.4 g/dL (ref 30.0–36.0)
MCV: 94.9 fL (ref 80.0–100.0)
Platelets: 207 10*3/uL (ref 150–400)
RBC: 5.13 MIL/uL — ABNORMAL HIGH (ref 3.87–5.11)
RDW: 12 % (ref 11.5–15.5)
WBC: 11.7 10*3/uL — ABNORMAL HIGH (ref 4.0–10.5)
nRBC: 0 % (ref 0.0–0.2)

## 2019-02-14 LAB — PROTIME-INR
INR: 1.07
Prothrombin Time: 13.8 seconds (ref 11.4–15.2)

## 2019-02-14 MED ORDER — HYDRALAZINE HCL 20 MG/ML IJ SOLN: 5.0000 mg | INTRAMUSCULAR | Status: DC | PRN

## 2019-02-14 MED ORDER — POTASSIUM CHLORIDE CRYS ER 10 MEQ PO TBCR
10.0000 meq | EXTENDED_RELEASE_TABLET | Freq: Every day | ORAL | Status: DC
  Administered 2019-02-14 – 2019-02-19 (×5): 10 meq via ORAL
  Filled 2019-02-14 (×6): qty 1

## 2019-02-14 MED ORDER — ACETAMINOPHEN 325 MG RE SUPP: 325.0000 mg | RECTAL | Status: DC | PRN

## 2019-02-14 MED ORDER — DULOXETINE HCL 60 MG PO CPEP
60.0000 mg | ORAL_CAPSULE | Freq: Two times a day (BID) | ORAL | Status: DC
  Administered 2019-02-14 – 2019-02-19 (×9): 60 mg via ORAL
  Filled 2019-02-14 (×9): qty 1

## 2019-02-14 MED ORDER — IOPAMIDOL (ISOVUE-370) INJECTION 76%
INTRAVENOUS | Status: AC
  Filled 2019-02-14: qty 100

## 2019-02-14 MED ORDER — ESCITALOPRAM OXALATE 10 MG PO TABS
10.0000 mg | ORAL_TABLET | Freq: Every day | ORAL | Status: DC
  Administered 2019-02-15 – 2019-02-19 (×4): 10 mg via ORAL
  Filled 2019-02-14 (×4): qty 1

## 2019-02-14 MED ORDER — DOCUSATE SODIUM 100 MG PO CAPS
100.0000 mg | ORAL_CAPSULE | Freq: Two times a day (BID) | ORAL | Status: DC
  Administered 2019-02-14 – 2019-02-15 (×3): 100 mg via ORAL
  Filled 2019-02-14 (×3): qty 1

## 2019-02-14 MED ORDER — CARVEDILOL 12.5 MG PO TABS
12.5000 mg | ORAL_TABLET | Freq: Two times a day (BID) | ORAL | Status: DC
  Administered 2019-02-15 – 2019-02-19 (×7): 12.5 mg via ORAL
  Filled 2019-02-14 (×8): qty 1

## 2019-02-14 MED ORDER — PHENOL 1.4 % MT LIQD: 1.0000 | OROMUCOSAL | Status: DC | PRN

## 2019-02-14 MED ORDER — LABETALOL HCL 5 MG/ML IV SOLN
10.0000 mg | INTRAVENOUS | Status: DC | PRN
  Administered 2019-02-14 – 2019-02-15 (×3): 10 mg via INTRAVENOUS
  Filled 2019-02-14 (×3): qty 4

## 2019-02-14 MED ORDER — OXYCODONE HCL 5 MG PO TABS
5.0000 mg | ORAL_TABLET | ORAL | Status: DC | PRN
  Administered 2019-02-14: 10 mg via ORAL
  Filled 2019-02-14: qty 2

## 2019-02-14 MED ORDER — DEXTROSE-NACL 5-0.45 % IV SOLN
INTRAVENOUS | Status: DC
  Administered 2019-02-14 – 2019-02-18 (×7): via INTRAVENOUS

## 2019-02-14 MED ORDER — LOSARTAN POTASSIUM 50 MG PO TABS
100.0000 mg | ORAL_TABLET | Freq: Every day | ORAL | Status: DC
  Administered 2019-02-15 – 2019-02-19 (×4): 100 mg via ORAL
  Filled 2019-02-14 (×4): qty 2

## 2019-02-14 MED ORDER — METOPROLOL TARTRATE 5 MG/5ML IV SOLN: 2.0000 mg | INTRAVENOUS | Status: DC | PRN

## 2019-02-14 MED ORDER — MORPHINE SULFATE (PF) 2 MG/ML IV SOLN
2.0000 mg | INTRAVENOUS | Status: DC | PRN
  Administered 2019-02-14 – 2019-02-15 (×7): 2 mg via INTRAVENOUS
  Filled 2019-02-14 (×7): qty 1

## 2019-02-14 MED ORDER — ALUM & MAG HYDROXIDE-SIMETH 200-200-20 MG/5ML PO SUSP: 15.0000 mL | ORAL | Status: DC | PRN

## 2019-02-14 MED ORDER — PANTOPRAZOLE SODIUM 40 MG PO TBEC
40.0000 mg | DELAYED_RELEASE_TABLET | Freq: Every day | ORAL | Status: DC
  Administered 2019-02-15 – 2019-02-19 (×4): 40 mg via ORAL
  Filled 2019-02-14 (×4): qty 1

## 2019-02-14 MED ORDER — GABAPENTIN 400 MG PO CAPS
800.0000 mg | ORAL_CAPSULE | Freq: Four times a day (QID) | ORAL | Status: DC
  Administered 2019-02-14 – 2019-02-19 (×14): 800 mg via ORAL
  Filled 2019-02-14 (×15): qty 2

## 2019-02-14 MED ORDER — POTASSIUM CHLORIDE CRYS ER 20 MEQ PO TBCR
40.0000 meq | EXTENDED_RELEASE_TABLET | Freq: Once | ORAL | Status: AC
  Administered 2019-02-14: 40 meq via ORAL
  Filled 2019-02-14: qty 2

## 2019-02-14 MED ORDER — ATORVASTATIN CALCIUM 40 MG PO TABS
40.0000 mg | ORAL_TABLET | Freq: Every day | ORAL | Status: DC
  Administered 2019-02-15 – 2019-02-19 (×4): 40 mg via ORAL
  Filled 2019-02-14 (×4): qty 1

## 2019-02-14 MED ORDER — VITAMIN E 180 MG (400 UNIT) PO CAPS
800.0000 [IU] | ORAL_CAPSULE | Freq: Two times a day (BID) | ORAL | Status: DC
  Administered 2019-02-14 – 2019-02-19 (×9): 800 [IU] via ORAL
  Filled 2019-02-14 (×11): qty 2

## 2019-02-14 MED ORDER — FENTANYL CITRATE (PF) 100 MCG/2ML IJ SOLN
50.0000 ug | Freq: Once | INTRAMUSCULAR | Status: AC
  Administered 2019-02-14: 50 ug via INTRAVENOUS
  Filled 2019-02-14: qty 2

## 2019-02-14 MED ORDER — IOPAMIDOL (ISOVUE-370) INJECTION 76%
INTRAVENOUS | Status: AC
  Filled 2019-02-14: qty 50

## 2019-02-14 MED ORDER — HEPARIN (PORCINE) 25000 UT/250ML-% IV SOLN
1050.0000 [IU]/h | INTRAVENOUS | Status: DC
  Administered 2019-02-14: 1500 [IU]/h via INTRAVENOUS
  Administered 2019-02-15: 1250 [IU]/h via INTRAVENOUS
  Administered 2019-02-16: 1050 [IU]/h via INTRAVENOUS
  Filled 2019-02-14 (×3): qty 250

## 2019-02-14 MED ORDER — ONDANSETRON HCL 4 MG/2ML IJ SOLN: 4.0000 mg | Freq: Four times a day (QID) | INTRAMUSCULAR | Status: DC | PRN

## 2019-02-14 MED ORDER — GUAIFENESIN-DM 100-10 MG/5ML PO SYRP: 15.0000 mL | ORAL_SOLUTION | ORAL | Status: DC | PRN

## 2019-02-14 MED ORDER — HEPARIN BOLUS VIA INFUSION
4000.0000 [IU] | Freq: Once | INTRAVENOUS | Status: AC
  Administered 2019-02-14: 4000 [IU] via INTRAVENOUS
  Filled 2019-02-14: qty 4000

## 2019-02-14 MED ORDER — HYDROCHLOROTHIAZIDE 25 MG PO TABS
25.0000 mg | ORAL_TABLET | Freq: Every day | ORAL | Status: DC
  Administered 2019-02-15 – 2019-02-19 (×4): 25 mg via ORAL
  Filled 2019-02-14 (×4): qty 1

## 2019-02-14 MED ORDER — ACETAMINOPHEN 325 MG PO TABS: 325.0000 mg | ORAL_TABLET | ORAL | Status: DC | PRN

## 2019-02-14 MED ORDER — ALBUTEROL SULFATE (2.5 MG/3ML) 0.083% IN NEBU: 2.5000 mg | INHALATION_SOLUTION | Freq: Four times a day (QID) | RESPIRATORY_TRACT | Status: DC | PRN

## 2019-02-14 MED ORDER — DIAZEPAM 5 MG PO TABS
5.0000 mg | ORAL_TABLET | Freq: Three times a day (TID) | ORAL | Status: DC
  Administered 2019-02-14 – 2019-02-19 (×10): 5 mg via ORAL
  Filled 2019-02-14 (×11): qty 1

## 2019-02-14 MED ORDER — ASPIRIN EC 81 MG PO TBEC
81.0000 mg | DELAYED_RELEASE_TABLET | Freq: Every day | ORAL | Status: DC
  Administered 2019-02-15 – 2019-02-19 (×4): 81 mg via ORAL
  Filled 2019-02-14 (×4): qty 1

## 2019-02-14 MED ORDER — HYDROXYZINE HCL 25 MG PO TABS: 50.0000 mg | ORAL_TABLET | Freq: Three times a day (TID) | ORAL | Status: DC | PRN

## 2019-02-14 MED ORDER — LURASIDONE HCL 40 MG PO TABS
120.0000 mg | ORAL_TABLET | Freq: Two times a day (BID) | ORAL | Status: DC
  Administered 2019-02-14 – 2019-02-15 (×2): 120 mg via ORAL
  Filled 2019-02-14 (×3): qty 3

## 2019-02-14 MED ORDER — IOPAMIDOL (ISOVUE-370) INJECTION 76%
125.0000 mL | Freq: Once | INTRAVENOUS | Status: AC | PRN
  Administered 2019-02-14: 125 mL via INTRAVENOUS

## 2019-02-14 NOTE — ED Triage Notes (Signed)
Per EMS, pt c/o left foot pain x 3 days. Pt denies trauma, left foot discolored, cool to touch.

## 2019-02-14 NOTE — ED Provider Notes (Addendum)
[facility] EMERGENCY DEPARTMENT Provider Note   CSN: [id number] Arrival date & time: 02/14/19  1641     History   Chief Complaint Chief Complaint  Patient presents with  . Foot Pain    HPI Alisha [name] is a 58 y.o. female.  HPI  58 year old female history of peripheral vascular disease, smoker, hypertension, CHF presents today with 3-day history of left foot pain.  She states it began worse today and became discolored and much more painful. Pain is 10/10.  She has increased pain with trying to stand or palpation.  She feels severe burning type pain. She reports that she ran out of Xarelto 3 days ago.  She reports that she has been seen by Dr. fields in the past and had a bypass on this leg.  She is unable to tell me when she last had any studies done Review of records states fem /pop bypass 2009   Past Medical History:  Diagnosis Date  . Anxiety   . Asthma   . Bipolar disorder ([place])   . Breast lump 02/2008   Biopsy 05/2008: showed no evidence of malignancy  . Breast mass in female    bi lat   . CHF (congestive heart failure) ([place])   . Chronic pain syndrome   . Degenerative joint disease of knee   . Dental caries   . Depression   . 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 Endometrial mass 10/12/2012   Endometrium, biopsy on 11/04/12 - PROLIFERATIVE ENDOMETRIUM AND ABUNDANT MUCUS. NO HYPERPLASIA OR CARCINOMA.   . Fibromyalgia   . Hyperlipidemia   . Hypertension   . Irregular menses 9/08  . Joint pain   . Mixed stress and urge urinary incontinence    [facility], underwent cystoscopy & uroflowmetry   . Pulmonary edema   . PVD (peripheral vascular disease) ([place])    s/p L fem-pop bypass 11/21/08; graft occluded 12/28/08;  aortogram w/ bilat LE runoff: 1.  bilat diffuse SFA occlusive dz, 2.  Mod to severe above-knee popliteal dz,  3.  Bilat 3-vessel runoff w/ mild tibial  occlusive dz.  . Restless leg syndrome   . Sigmoid diverticulitis 10/26/2012  . Tobacco abuse   . Wears glasses     Patient Active Problem List   Diagnosis Date Noted  . Loss of consciousness ([place]) 02/15/2018  . Asthma 10/07/2016  . Colonic diverticular abscess   . Anxiety 08/28/2014  . Breast pain, left 08/11/2014  . Abnormal ECG 01/28/2014  . Health care maintenance 09/16/2013  . Metrorrhagia 10/12/2012  . Endometrial mass 10/12/2012  . 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 Osteoarthritis of both knees 02/15/2012  . Chronic pain syndrome 07/05/2010  . Venous thromboembolism 02/22/2010  . Depression 07/17/2009  . 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 TOBACCO ABUSE 10/20/2006  . Essential hypertension 10/20/2006  . Peripheral vascular disease ([place]) 10/20/2006  . HYPERLIPIDEMIA, MIXED 10/10/2006    Past Surgical History:  Procedure Laterality Date  .  Right external iliac artery stent  11/04/2008  . ABDOMINAL AORTAGRAM     11/04/2008; 10/14/2006  . BREAST LUMPECTOMY W/ NEEDLE LOCALIZATION Right 06/13/2008  . BREAST LUMPECTOMY WITH RADIOACTIVE SEED LOCALIZATION Bilateral 05/03/2014   Procedure: BREAST LUMPECTOMY WITH RADIOACTIVE SEED LOCALIZATION;  Surgeon: Joyice Faster. Cornett, MD;  Location: [facility];  Service: General;  Laterality: Bilateral;  . COLONOSCOPY WITH PROPOFOL  01/25/2014  .  FEMORAL ENDARTERECTOMY Left 11/21/2008  . FEMORAL-POPLITEAL BYPASS GRAFT Left 11/21/2008  . LEFT HEART CATHETERIZATION WITH CORONARY ANGIOGRAM N/A 02/25/2014   Procedure: LEFT HEART CATHETERIZATION WITH CORONARY ANGIOGRAM;  Surgeon: Peter M Martinique, MD;  Location: [facility] CATH LAB;  Service: Cardiovascular;  Laterality: N/A;  . TONSILLECTOMY    . TUBAL LIGATION       OB History    Gravida  6   Para  4   Term  4   Preterm      AB  2   Living  4     SAB  1   TAB  1   Ectopic      Multiple      Live Births               Home Medications      Prior to Admission medications   Medication Sig Start Date End Date Taking? Authorizing Provider  albuterol (VENTOLIN HFA) 108 (90 Base) MCG/ACT inhaler Inhale 1-2 puffs into the lungs every 6 (six) hours as needed for wheezing or shortness of breath. 02/18/18   Tawny Asal, MD  aspirin EC 81 MG tablet Take 81 mg by mouth daily.    [provider]  atorvastatin (LIPITOR) 40 MG tablet Take 1 tablet (40 mg total) by mouth at bedtime. Patient taking differently: Take 40 mg by mouth daily.  02/17/18   Tawny Asal, MD  carvedilol (COREG) 12.5 MG tablet Take 1 tablet (12.5 mg total) by mouth 2 (two) times daily with a meal. 02/17/18   Tawny Asal, MD  diazepam (VALIUM) 5 MG tablet Take 5 mg by mouth 3 (three) times daily.     [provider]  DULoxetine (CYMBALTA) 60 MG capsule Take 60 mg by mouth 2 (two) times daily.     [provider]  escitalopram (LEXAPRO) 10 MG tablet Take 10 mg by mouth daily.    [provider]  gabapentin (NEURONTIN) 800 MG tablet Take 800 mg by mouth 4 (four) times daily.    [provider]  hydrochlorothiazide (HYDRODIURIL) 25 MG tablet Take 1 tablet (25 mg total) by mouth daily. 02/17/18   Tawny Asal, MD  hydrOXYzine (ATARAX/VISTARIL) 50 MG tablet Take 50 mg by mouth 3 (three) times daily as needed for anxiety.    [provider]  losartan (COZAAR) 100 MG tablet Take 1 tablet (100 mg total) by mouth daily. 01/06/19 01/01/20  Nahser, Wonda Cheng, MD  Lurasidone HCl (LATUDA) 120 MG TABS Take 120 mg by mouth 2 (two) times daily.    [provider]  potassium chloride (K-DUR) 10 MEQ tablet Take 10 mEq by mouth daily. 10/19/18   [provider]  vitamin E (VITAMIN E) 400 UNIT capsule Take 800 Units by mouth 2 (two) times daily.    [provider]  XARELTO 10 MG TABS tablet Take 10 mg by mouth daily. 10/19/18   [provider]    Family History Family History  Problem Relation Age of  Onset  . Breast cancer Mother   . Depression Mother   . Hyperlipidemia Mother   . Mental retardation Mother   . Hyperlipidemia Father   . Mental retardation Father   . Breast cancer Other        GREAT AUNT  . Depression Sister   . Hyperlipidemia Sister   . Mental retardation Sister   . Mental retardation Daughter     Social History Social History   Tobacco Use  . Smoking  status: Current Every Day Smoker    Packs/day: 0.50    Years: 18.00    Pack years: 9.00    Types: Cigarettes  . Smokeless tobacco: Never Used  . Tobacco comment: 1pk 1/2 PACK DAY 2-3 0r none  Substance Use Topics  . Alcohol use: Not Currently    Alcohol/week: 0.0 standard drinks  . Drug use: No    Comment: hx of marijuana and cocaine use; uses Suboxone patch     Allergies   Propoxyphene n-acetaminophen; Buspar [buspirone]; Grapefruit extract; and Strawberry extract   Review of Systems Review of Systems  All other systems reviewed and are negative.    Physical Exam Updated Vital Signs BP (!) 190/130 (BP Location: Left Arm)   Pulse (!) 110   Temp 97.9 F (36.6 C) (Oral)   Resp 16   LMP 11/22/2015   SpO2 95%   Physical Exam Vitals signs and nursing note reviewed.  Constitutional:      General: She is not in acute distress.    Appearance: Normal appearance. She is obese. She is not toxic-appearing or diaphoretic.  HENT:     Head: Normocephalic and atraumatic.     Right Ear: External ear normal.     Left Ear: External ear normal.     Nose: Nose normal.     Mouth/Throat:     Mouth: Mucous membranes are moist.  Eyes:     Extraocular Movements: Extraocular movements intact.     Pupils: Pupils are equal, round, and reactive to light.  Neck:     Musculoskeletal: Normal range of motion.  Cardiovascular:     Rate and Rhythm: Normal rate.     Comments: No palpable pulse left foot dp or pt  Pulmonary:     Effort: Pulmonary effort is normal.     Breath sounds: Normal breath sounds.    Abdominal:     General: Abdomen is flat.  Musculoskeletal:     Comments: Not able to move toes Very decreased plantar and dorsiflex Purple discoloration toes to lower calf   Skin:    Capillary Refill: Capillary refill takes less than 2 seconds.  Neurological:     General: No focal deficit present.     Mental Status: She is alert.     Cranial Nerves: No cranial nerve deficit.     Sensory: Sensory deficit present.     Motor: Weakness present.     Coordination: Coordination normal.  Psychiatric:        Mood and Affect: Mood normal.      ED Treatments / Results  Labs (all labs ordered are listed, but only abnormal results are displayed) Labs Reviewed - No data to display  EKG EKG Interpretation  Date/Time:  Sunday February 14 2019 17:07:13 EST Ventricular Rate:  102 PR Interval:    QRS Duration: 90 QT Interval:  400 QTC Calculation: 522 R Axis:   28 Text Interpretation:  Sinus tachycardia Probable left atrial enlargement Prolonged QT interval Baseline wander in lead(s) I III aVL V2 V3 V4 V5 Confirmed by Pattricia Boss [phone]) on 02/14/2019 6:54:49 PM   Radiology No results found.  Procedures Procedures (including critical care time)  Medications Ordered in ED Medications - No data to display   Initial Impression / Assessment and Plan / ED Course  I have reviewed the triage vital signs and the nursing notes.  Pertinent labs & imaging results that were available during my care of the patient were reviewed by me and considered in  my medical decision making (see chart for details).  Clinical Course as of Feb 14 1707  Sun Feb 14, 2019  1654 Out of xarelto x 3 days. Dr. Oneida Alar is vascular surgeon.   [JR]    Clinical Course User Index [JR] Robinson, [place] N, PA-C   Patient made npo IV access and labs ordered Discussed with Dr. Oneida Alar and he is coming in to see patient.  6:15 PM Dr. Oneida Alar at bedside and will admit patient . CBC back and chemistry pending-  Dr.Fields aware  Final Clinical Impressions(s) / ED Diagnoses   Final diagnoses:  Ischemia of foot    ED Discharge Orders    None       Pattricia Boss, MD 02/14/19 2909    Pattricia Boss, MD 02/14/19 [phone]

## 2019-02-14 NOTE — ED Notes (Signed)
Dr. Belfi at bedside 

## 2019-02-14 NOTE — ED Notes (Signed)
Patient transported to CT 

## 2019-02-14 NOTE — Progress Notes (Addendum)
Addendum: Weight updated by RN = 93.9 kg Ok to continue current dosing.  Will follow-up level.   Sloan Leiter, PharmD, BCPS, BCCCP Clinical PharmacistPlease refer to [facility] for [place] numbers   ANTICOAGULATION CONSULT NOTE - Initial Consult  Pharmacy Consult for IV Heparin Indication: Left Leg Ischemia  Allergies  Allergen Reactions  . Propoxyphene N-Acetaminophen Anaphylaxis and Swelling  . Strawberry Extract Anaphylaxis and Shortness Of Breath    "I go into shock"  . Buspar [Buspirone] Swelling    "Swells my face"  . Grapefruit Extract Other (See Comments)    Interaction with medications she's taking    Patient Measurements: Weight (02/17/18, 1year ago) = 97.2 kg Height = 168.3 cm Heparin Dosing Weight:   Vital Signs: Temp: 97.9 F (36.6 C) (02/16 1647) Temp Source: Oral (02/16 1647) BP: 190/130 (02/16 1647) Pulse Rate: 110 (02/16 1647)  Labs: Recent Labs    02/14/19 1655  HGB 15.8*  HCT 48.7*  PLT 207  LABPROT 13.8  INR 1.07  CREATININE 1.01*    CrCl cannot be calculated (Unknown ideal weight.).   Medical History: Past Medical History:  Diagnosis Date  . Anxiety   . Asthma   . Bipolar disorder ([place])   . Breast lump 02/2008   Biopsy 05/2008: showed no evidence of malignancy  . Breast mass in female    bi lat   . CHF (congestive heart failure) ([place])   . Chronic pain syndrome   . Degenerative joint disease of knee   . Dental caries   . Depression   . 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 Endometrial mass 10/12/2012   Endometrium, biopsy on 11/04/12 - PROLIFERATIVE ENDOMETRIUM AND ABUNDANT MUCUS. NO HYPERPLASIA OR CARCINOMA.   . Fibromyalgia   . Hyperlipidemia   . Hypertension   . Irregular menses 9/08  . Joint pain   . Mixed stress and urge urinary incontinence    [facility], underwent cystoscopy & uroflowmetry   . Pulmonary edema   . PVD (peripheral vascular  disease) ([place])    s/p L fem-pop bypass 11/21/08; graft occluded 12/28/08;  aortogram w/ bilat LE runoff: 1.  bilat diffuse SFA occlusive dz, 2.  Mod to severe above-knee popliteal dz,  3.  Bilat 3-vessel runoff w/ mild tibial occlusive dz.  . Restless leg syndrome   . Sigmoid diverticulitis 10/26/2012  . Tobacco abuse   . Wears glasses      Assessment: 58 year old female on Xarelto prior to admission for history of multiple DVTs now in ED with left leg ischemia. Pharmacy consulted to transition to IV Heparin.   Last Xarelto dose was on 02/11/19 at 0800 AM per patient as she reports she ran out of the medication 3 days ago (>72 hrs since last dose). Patient has had a prior bypass on this leg.    INR 1.07. Hgb 15.8, Platelets 207 - within normal limits. No bleeding noted.   Goal of Therapy:  Heparin level 0.3-0.7 units/ml Monitor platelets by anticoagulation protocol: Yes   Plan:  Start IV Heparin - bolus 4000 units then Heparin drip at 1500 units/hr.  Re-verify weight Heparin level in 6 hours.  Daily heparin level and CBC while on therapy.   Sloan Leiter, PharmD, BCPS, BCCCP Clinical Pharmacist Please refer to [facility] for [place] numbers 02/14/2019,6:58 PM

## 2019-02-14 NOTE — Progress Notes (Signed)
CTA reviewed.  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  Right external iliac stent patent right side right SFA occluded reconstitutes BK pop 3 vessel runoff but small  Will discuss findings with pt in morning and consider possible aortobifem.  Most likely she will need BkA or Aka right side regardless  Ruta Hinds, MD [facility] Office: [phone] Pager: [phone]

## 2019-02-14 NOTE — ED Notes (Signed)
Attempted to give report, receiving floor states they are still working on approving the bed.

## 2019-02-14 NOTE — H&P (Signed)
Patient name: Alisha [name] MRN: [id number] DOB: 02/17/1961 Sex: female  HPI: Alisha [name] is a 58 y.o. female with known PAD.  She has a 3 day history of dark discoloration of her toes in her left foot.  Her foot has been hurting for 5 days.  She had prior left femoral endarterectomy and left fem pop and right external iliac artery stent.  Her fem pop was in 2009.  It occluded after one month.  She was last seen in by [name] in 2014.  At that time her ABI was 0.7 bilaterally with known bilateral SFA occlusions. She currently smokes about 1/2 ppd.  She is ambulatory but has a home health aid that assists with cooking cleaning and medications.  Other medical problems include bipolar, DVT (lifelong xarelto, ran out a few days ago), hypertension, hyperlipidemia which are stable.  She is on aspirin and a statin.  Past Medical History:  Diagnosis Date  . Anxiety   . Asthma   . Bipolar disorder ([place])   . Breast lump 02/2008   Biopsy 05/2008: showed no evidence of malignancy  . Breast mass in female    bi lat   . CHF (congestive heart failure) ([place])   . Chronic pain syndrome   . Degenerative joint disease of knee   . Dental caries   . Depression   . 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 Endometrial mass 10/12/2012   Endometrium, biopsy on 11/04/12 - PROLIFERATIVE ENDOMETRIUM AND ABUNDANT MUCUS. NO HYPERPLASIA OR CARCINOMA.   . Fibromyalgia   . Hyperlipidemia   . Hypertension   . Irregular menses 9/08  . Joint pain   . Mixed stress and urge urinary incontinence    Being followed by [facility], underwent cystoscopy & uroflowmetry   . Pulmonary edema   . PVD (peripheral vascular disease) ([place])    s/p L fem-pop bypass 11/21/08; graft occluded 12/28/08;  aortogram w/ bilat LE runoff: 1.  bilat diffuse SFA occlusive dz, 2.  Mod to severe above-knee popliteal dz,  3.  Bilat 3-vessel runoff w/ mild tibial occlusive dz.  .  Restless leg syndrome   . Sigmoid diverticulitis 10/26/2012  . Tobacco abuse   . Wears glasses    Past Surgical History:  Procedure Laterality Date  .  Right external iliac artery stent  11/04/2008  . ABDOMINAL AORTAGRAM     11/04/2008; 10/14/2006  . BREAST LUMPECTOMY W/ NEEDLE LOCALIZATION Right 06/13/2008  . BREAST LUMPECTOMY WITH RADIOACTIVE SEED LOCALIZATION Bilateral 05/03/2014   Procedure: BREAST LUMPECTOMY WITH RADIOACTIVE SEED LOCALIZATION;  Surgeon: Joyice Faster. Cornett, MD;  Location: [facility];  Service: General;  Laterality: Bilateral;  . COLONOSCOPY WITH PROPOFOL  01/25/2014  . FEMORAL ENDARTERECTOMY Left 11/21/2008  . FEMORAL-POPLITEAL BYPASS GRAFT Left 11/21/2008  . LEFT HEART CATHETERIZATION WITH CORONARY ANGIOGRAM N/A 02/25/2014   Procedure: LEFT HEART CATHETERIZATION WITH CORONARY ANGIOGRAM;  Surgeon: Peter M Martinique, MD;  Location: [facility] CATH LAB;  Service: Cardiovascular;  Laterality: N/A;  . TONSILLECTOMY    . TUBAL LIGATION      Family History  Problem Relation Age of Onset  . Breast cancer Mother   . Depression Mother   . Hyperlipidemia Mother   . Mental retardation Mother   . Hyperlipidemia Father   . Mental retardation Father   . Breast cancer Other        GREAT AUNT  . Depression Sister   .  Hyperlipidemia Sister   . Mental retardation Sister   . Mental retardation Daughter     SOCIAL HISTORY: Social History   Socioeconomic History  . Marital status: Divorced    Spouse name: Not on file  . Number of children: 4  . Years of education: Not on file  . Highest education level: Not on file  Occupational History  . Occupation: disable    Employer: UNEMPLOYED  Social Needs  . Financial resource strain: Not on file  . Food insecurity:    Worry: Not on file    Inability: Not on file  . Transportation needs:    Medical: Not on file    Non-medical: Not on file  Tobacco Use  . Smoking status: Current Every Day Smoker    Packs/day: 0.50     Years: 18.00    Pack years: 9.00    Types: Cigarettes  . Smokeless tobacco: Never Used  . Tobacco comment: 1pk 1/2 PACK DAY 2-3 0r none  Substance and Sexual Activity  . Alcohol use: Not Currently    Alcohol/week: 0.0 standard drinks  . Drug use: No    Comment: hx of marijuana and cocaine use; uses Suboxone patch  . Sexual activity: Never  Lifestyle  . Physical activity:    Days per week: Not on file    Minutes per session: Not on file  . Stress: Not on file  Relationships  . Social connections:    Talks on phone: Not on file    Gets together: Not on file    Attends religious service: Not on file    Active member of club or organization: Not on file    Attends meetings of clubs or organizations: Not on file    Relationship status: Not on file  . Intimate partner violence:    Fear of current or ex partner: Not on file    Emotionally abused: Not on file    Physically abused: Not on file    Forced sexual activity: Not on file  Other Topics Concern  . Not on file  Social History Narrative   Financial assistance application initiated. Patient needs to submit further paperwork to complete   [name]  September 11, 2010 2:04 PM   Financial assistance approved for 100% discount at [facility] and has [facility] [facility] card   [name]  October 04, 2010 5:29 PM    Allergies  Allergen Reactions  . Propoxyphene N-Acetaminophen Swelling  . Buspar [Buspirone] Swelling    " It swell my face."  . Grapefruit Extract     Interaction with medications she's taking  . Strawberry Extract     Electric shock     No current facility-administered medications for this encounter.    Current Outpatient Medications  Medication Sig Dispense Refill  . albuterol (VENTOLIN HFA) 108 (90 Base) MCG/ACT inhaler Inhale 1-2 puffs into the lungs every 6 (six) hours as needed for wheezing or shortness of breath. 1 Inhaler 2  . aspirin EC 81 MG tablet Take 81 mg by mouth daily.    Marland Kitchen atorvastatin (LIPITOR) 40 MG  tablet Take 1 tablet (40 mg total) by mouth at bedtime. (Patient taking differently: Take 40 mg by mouth daily. ) 90 tablet 3  . carvedilol (COREG) 12.5 MG tablet Take 1 tablet (12.5 mg total) by mouth 2 (two) times daily with a meal. 180 tablet 1  . diazepam (VALIUM) 5 MG tablet Take 5 mg by mouth 3 (three) times daily.     Marland Kitchen  DULoxetine (CYMBALTA) 60 MG capsule Take 60 mg by mouth 2 (two) times daily.     Marland Kitchen escitalopram (LEXAPRO) 10 MG tablet Take 10 mg by mouth daily.    Marland Kitchen gabapentin (NEURONTIN) 800 MG tablet Take 800 mg by mouth 4 (four) times daily.    . hydrochlorothiazide (HYDRODIURIL) 25 MG tablet Take 1 tablet (25 mg total) by mouth daily. 90 tablet 1  . hydrOXYzine (ATARAX/VISTARIL) 50 MG tablet Take 50 mg by mouth 3 (three) times daily as needed for anxiety.    Marland Kitchen losartan (COZAAR) 100 MG tablet Take 1 tablet (100 mg total) by mouth daily. 90 tablet 3  . Lurasidone HCl (LATUDA) 120 MG TABS Take 120 mg by mouth 2 (two) times daily.    . potassium chloride (K-DUR) 10 MEQ tablet Take 10 mEq by mouth daily.  5  . vitamin E (VITAMIN E) 400 UNIT capsule Take 800 Units by mouth 2 (two) times daily.    Alveda Reasons 10 MG TABS tablet Take 10 mg by mouth daily.  5    ROS:   General:  No weight loss, Fever, chills  HEENT: No recent headaches, no nasal bleeding, no visual changes, no sore throat  Neurologic: No dizziness, blackouts, seizures. No recent symptoms of stroke or mini- stroke. No recent episodes of slurred speech, or temporary blindness.  Cardiac: No recent episodes of chest pain/pressure, no shortness of breath at rest.  No shortness of breath with exertion.  Denies history of atrial fibrillation or irregular heartbeat  Vascular: No history of rest pain in feet.  + history of claudication.  No history of non-healing ulcer, No history of DVT   Pulmonary: No home oxygen, no productive cough, no hemoptysis,  No asthma or wheezing  Musculoskeletal:  [ ]  Arthritis, [ x] Low back pain,   [ ]  Joint pain  Hematologic:No history of hypercoagulable state.  No history of easy bleeding.  No history of anemia  Gastrointestinal: No hematochezia or melena,  No gastroesophageal reflux, no trouble swallowing  Urinary: [ ]  chronic Kidney disease, [ ]  on HD - [ ]  MWF or [ ]  TTHS, [ ]  Burning with urination, [ ]  Frequent urination, [ ]  Difficulty urinating;   Skin: No rashes  Psychological: No history of anxiety,  No history of depression   Physical Examination  Vitals:   02/14/19 1647  BP: (!) 190/130  Pulse: (!) 110  Resp: 16  Temp: 97.9 F (36.6 C)  TempSrc: Oral  SpO2: 95%    There is no height or weight on file to calculate BMI.  General:  Alert and oriented, no acute distress HEENT: Normal Neck: No JVD Pulmonary: Clear to auscultation bilaterally Cardiac: Regular Rate and Rhythm  Abdomen: Soft, non-tender, non-distended, no mass Skin: No rash, early gangrene toes 1-5 left foot Extremity Pulses:  2+ radial, brachial, absent femoral, popliteal dorsalis pedis, posterior tibial pulses bilaterally, DP PT doppler right foot, left foot cool to midleg, erythema mid tibia left leg Musculoskeletal: No deformity or edema  Neurologic: Upper and lower extremity motor 5/5 and symmetric  DATA:  CBC    Component Value Date/Time   WBC 11.7 (H) 02/14/2019 1655   RBC 5.13 (H) 02/14/2019 1655   HGB 15.8 (H) 02/14/2019 1655   HCT 48.7 (H) 02/14/2019 1655   PLT 207 02/14/2019 1655   MCV 94.9 02/14/2019 1655   MCH 30.8 02/14/2019 1655   MCHC 32.4 02/14/2019 1655   RDW 12.0 02/14/2019 1655   LYMPHSABS 5.6 (H)  02/15/2018 0636   MONOABS 0.6 02/15/2018 0636   EOSABS 0.3 02/15/2018 0636   BASOSABS 0.1 02/15/2018 0636   EKG sinus tachycardia  CMP pending  ASSESSMENT:  Gangrene left foot with known history of PAD acute worsening of chronic disease   PLAN:  CT angio with runoff to evaluate  Inflow disease.  Doubt left foot is salvageable at this point.  Will admit and place  on heparin for now during planning of possible revascularization  Informed pt she is at very high risk of limb loss left leg   Ruta Hinds, MD [facility]: [phone] Pager: [phone]

## 2019-02-14 NOTE — Progress Notes (Addendum)
Unknown amount of contrast extravasated into left arm. IV removed and ice pack applied with elevated extremity. Pt assessed by Dr Enriqueta Shutter, radiologist. Dr Enriqueta Shutter to enter extravasation orders. Pt transported to floor unit from CT by NT. Safety zone will be completed.

## 2019-02-14 NOTE — ED Notes (Signed)
Unable to find pulse in left foot with doppler.

## 2019-02-14 NOTE — ED Notes (Signed)
Pt is still in CT

## 2019-02-15 LAB — URINALYSIS, ROUTINE W REFLEX MICROSCOPIC
BILIRUBIN URINE: NEGATIVE
Glucose, UA: NEGATIVE mg/dL
Hgb urine dipstick: NEGATIVE
Ketones, ur: NEGATIVE mg/dL
LEUKOCYTE UA: NEGATIVE
Nitrite: NEGATIVE
Protein, ur: 30 mg/dL — AB
Specific Gravity, Urine: >1.046 — ABNORMAL HIGH (ref 1.005–1.030)
pH: 5 (ref 5.0–8.0)

## 2019-02-15 LAB — RAPID URINE DRUG SCREEN, HOSP PERFORMED
Amphetamines: POSITIVE — AB
Barbiturates: NOT DETECTED
Benzodiazepines: POSITIVE — AB
Cocaine: POSITIVE — AB
Opiates: POSITIVE — AB
Tetrahydrocannabinol: NOT DETECTED

## 2019-02-15 LAB — APTT
APTT: 109 s — AB (ref 24–36)
aPTT: 144 seconds — ABNORMAL HIGH (ref 24–36)

## 2019-02-15 LAB — HEPARIN LEVEL (UNFRACTIONATED)
HEPARIN UNFRACTIONATED: 1.46 [IU]/mL — AB (ref 0.30–0.70)
Heparin Unfractionated: 1.06 IU/mL — ABNORMAL HIGH (ref 0.30–0.70)

## 2019-02-15 LAB — HIV ANTIBODY (ROUTINE TESTING W REFLEX): HIV Screen 4th Generation wRfx: NONREACTIVE

## 2019-02-15 MED ORDER — ONDANSETRON HCL 4 MG/2ML IJ SOLN: 4.0000 mg | Freq: Four times a day (QID) | INTRAMUSCULAR | Status: DC | PRN

## 2019-02-15 MED ORDER — LURASIDONE HCL 40 MG PO TABS
80.0000 mg | ORAL_TABLET | Freq: Two times a day (BID) | ORAL | Status: DC
  Administered 2019-02-15 – 2019-02-19 (×7): 80 mg via ORAL
  Filled 2019-02-15 (×9): qty 2

## 2019-02-15 MED ORDER — DIPHENHYDRAMINE HCL 50 MG/ML IJ SOLN: 12.5000 mg | Freq: Four times a day (QID) | INTRAMUSCULAR | Status: DC | PRN

## 2019-02-15 MED ORDER — MORPHINE SULFATE 2 MG/ML IV SOLN
INTRAVENOUS | Status: DC
  Administered 2019-02-15: 4.5 mg via INTRAVENOUS

## 2019-02-15 MED ORDER — MORPHINE SULFATE (PF) 2 MG/ML IV SOLN
2.0000 mg | INTRAVENOUS | Status: DC | PRN
  Filled 2019-02-15: qty 1

## 2019-02-15 MED ORDER — SODIUM CHLORIDE 0.9 % IV BOLUS
500.0000 mL | Freq: Once | INTRAVENOUS | Status: AC
  Administered 2019-02-15: 500 mL via INTRAVENOUS

## 2019-02-15 MED ORDER — CEFAZOLIN SODIUM-DEXTROSE 2-4 GM/100ML-% IV SOLN
2.0000 g | INTRAVENOUS | Status: AC
  Administered 2019-02-16: 2 g via INTRAVENOUS
  Filled 2019-02-15 (×2): qty 100

## 2019-02-15 MED ORDER — NALOXONE HCL 0.4 MG/ML IJ SOLN: 0.4000 mg | INTRAMUSCULAR | Status: DC | PRN

## 2019-02-15 MED ORDER — SODIUM CHLORIDE 0.9% FLUSH: 9.0000 mL | INTRAVENOUS | Status: DC | PRN

## 2019-02-15 MED ORDER — DIPHENHYDRAMINE HCL 12.5 MG/5ML PO ELIX
12.5000 mg | ORAL_SOLUTION | Freq: Four times a day (QID) | ORAL | Status: DC | PRN
  Filled 2019-02-15: qty 5

## 2019-02-15 MED ORDER — SODIUM CHLORIDE 0.9 % IV BOLUS: 500.0000 mL | Freq: Once | INTRAVENOUS | Status: DC

## 2019-02-15 MED ORDER — MORPHINE SULFATE 2 MG/ML IV SOLN
INTRAVENOUS | Status: DC
  Administered 2019-02-15: 17:00:00 via INTRAVENOUS
  Filled 2019-02-15: qty 50

## 2019-02-15 MED ORDER — SODIUM CHLORIDE 0.9 % IV BOLUS: 1000.0000 mL | Freq: Once | INTRAVENOUS | Status: DC

## 2019-02-15 NOTE — Progress Notes (Signed)
[place] for IV Heparin Indication: Left Leg Ischemia  Allergies  Allergen Reactions  . Propoxyphene N-Acetaminophen Anaphylaxis and Swelling  . Strawberry Extract Anaphylaxis and Shortness Of Breath    "I go into shock"  . Buspar [Buspirone] Swelling    "Swells my face"  . Grapefruit Extract Other (See Comments)    Interaction with medications she's taking    Patient Measurements: Heparin Dosing Weight: 72.6kg  Vital Signs: Temp: 97.8 F (36.6 C) (02/16 2024) Temp Source: Oral (02/16 2024) BP: 152/97 (02/17 0100) Pulse Rate: 90 (02/16 2003)  Labs: Recent Labs    02/14/19 1655 02/15/19 0051  HGB 15.8*  --   HCT 48.7*  --   PLT 207  --   APTT  --  144*  LABPROT 13.8  --   INR 1.07  --   HEPARINUNFRC  --  1.46*  CREATININE 1.01*  --     Estimated Creatinine Clearance: 64.7 mL/min (A) (by C-G formula based on SCr of 1.01 mg/dL (H)).  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 Pharmacy consulted to transition to IV Heparin.   Last Xarelto dose was on 02/11/19 at 0800 AM per patient as she reports she ran out of the medication 3 days ago (>72 hrs since last dose). Patient has had a prior bypass on this leg.    Initial heparin level and aPTT elevated. No bleeding noted.    Goal of Therapy:  Heparin level 0.3-0.7 units/ml Monitor platelets by anticoagulation protocol: Yes   Plan:  Reduce heparin gtt to 1250 units/hr Check an 8 hr heparin level Daily heparin level and CBC  Salome Arnt, PharmD, BCPS Please see AMION for all pharmacy numbers 02/15/2019 3:06 AM

## 2019-02-15 NOTE — H&P (View-Only) (Signed)
[facility]  Subjective  - pain in foot   Objective 120/71 94 98.7 F (37.1 C) (Oral) 16 93%  Intake/Output Summary (Last 24 hours) at 02/15/2019 1423 Last data filed at 02/15/2019 1221 Gross per 24 hour  Intake 739.25 ml  Output -  Net 739.25 ml   Left foot toes gangrenous  CTA reviewed with Dr Donnetta Hutching no suitable target on left side for aortobifem, right leg asx  Assessment/Planning: Left AKA by Dr Scot Dock tomorrow NPO  Consent Will do PCA for pain control now  Ruta Hinds 02/15/2019 2:23 PM --  Laboratory Lab Results: Recent Labs    02/14/19 1655  WBC 11.7*  HGB 15.8*  HCT 48.7*  PLT 207   BMET Recent Labs    02/14/19 1655  NA 137  K 3.2*  CL 99  CO2 24  GLUCOSE 134*  BUN 19  CREATININE 1.01*  CALCIUM 9.9    COAG Lab Results  Component Value Date   INR 1.07 02/14/2019   INR 0.95 02/15/2018   INR 1.05 11/04/2017   No results found for: PTT

## 2019-02-15 NOTE — Progress Notes (Addendum)
Called MD regarding low BP (in the 80's) and Morphine PCA. Patient arousable to voice at this time. Orders given to reduce original PCA patient bolus dose by half with new bolus dose being 0.5mg . Also received orders to give 500cc bolus if BP did not come up after reducing PCA dose and to call on call MD if further orders needed. Nursing will continue to monitor.

## 2019-02-15 NOTE — Progress Notes (Signed)
[facility]  Subjective  - pain in foot   Objective 120/71 94 98.7 F (37.1 C) (Oral) 16 93%  Intake/Output Summary (Last 24 hours) at 02/15/2019 1423 Last data filed at 02/15/2019 1221 Gross per 24 hour  Intake 739.25 ml  Output -  Net 739.25 ml   Left foot toes gangrenous  CTA reviewed with Dr Donnetta Hutching no suitable target on left side for aortobifem, right leg asx  Assessment/Planning: Left AKA by Dr Scot Dock tomorrow NPO  Consent Will do PCA for pain control now  Ruta Hinds 02/15/2019 2:23 PM --  Laboratory Lab Results: Recent Labs    02/14/19 1655  WBC 11.7*  HGB 15.8*  HCT 48.7*  PLT 207   BMET Recent Labs    02/14/19 1655  NA 137  K 3.2*  CL 99  CO2 24  GLUCOSE 134*  BUN 19  CREATININE 1.01*  CALCIUM 9.9    COAG Lab Results  Component Value Date   INR 1.07 02/14/2019   INR 0.95 02/15/2018   INR 1.05 11/04/2017   No results found for: PTT

## 2019-02-15 NOTE — Progress Notes (Signed)
[place] for IV Heparin Indication: Left Leg Ischemia  Allergies  Allergen Reactions  . Propoxyphene N-Acetaminophen Anaphylaxis and Swelling  . Strawberry Extract Anaphylaxis and Shortness Of Breath    "I go into shock"  . Buspar [Buspirone] Swelling    "Swells my face"  . Grapefruit Extract Other (See Comments)    Interaction with medications she's taking    Patient Measurements: Heparin Dosing Weight: 72.6kg  Vital Signs: Temp: 98.7 F (37.1 C) (02/17 1220) Temp Source: Oral (02/17 1220) BP: 120/71 (02/17 1220) Pulse Rate: 94 (02/17 1220)  Labs: Recent Labs    02/14/19 1655 02/15/19 0051 02/15/19 1538  HGB 15.8*  --   --   HCT 48.7*  --   --   PLT 207  --   --   APTT  --  144* 109*  LABPROT 13.8  --   --   INR 1.07  --   --   HEPARINUNFRC  --  1.46*  --   CREATININE 1.01*  --   --     Estimated Creatinine Clearance: 64.7 mL/min (A) (by C-G formula based on SCr of 1.01 mg/dL (H)).  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 Pharmacy consulted to transition to IV Heparin.   Last Xarelto dose was on 02/11/19 at 0800 AM per patient as she reports she ran out of the medication 3 days ago (>72 hrs since last dose). Patient has had a prior bypass on this leg.    aPTT elevated at 109 on recheck - per RN, appears level was drawn from opposite arm. Heparin level was high this AM. No bleeding or issues with infusion per discussion with RN.  Goal of Therapy:  Heparin level 0.3-0.7 units/ml Monitor platelets by anticoagulation protocol: Yes   Plan:  Reduce heparin gtt to 1200 units/hr Check an 8 hr heparin level/aPTT Monitor daily heparin level and CBC, s/sx bleeding   Elicia Lamp, PharmD, BCPS Please check AMION for all [place] contact numbers Clinical Pharmacist 02/15/2019 4:39 PM

## 2019-02-15 NOTE — Progress Notes (Signed)
Referring Physician(s): Dr Pershing Proud  Supervising Physician: Markus Daft  Patient Status:  [facility] - In-pt  Chief Complaint:  Left arm CT contrast extravasation 2/16 ~20 cc contrast  Subjective:  Left arm still with minimal swelling near and at site of extrav. Some tenderness per pt.   Allergies: Propoxyphene n-acetaminophen; Strawberry extract; Buspar [buspirone]; and Grapefruit extract  Medications: Prior to Admission medications   Medication Sig Start Date End Date Taking? Authorizing Provider  albuterol (VENTOLIN HFA) 108 (90 Base) MCG/ACT inhaler Inhale 1-2 puffs into the lungs every 6 (six) hours as needed for wheezing or shortness of breath. 02/18/18  Yes Tawny Asal, MD  aspirin EC 81 MG tablet Take 81 mg by mouth daily.   Yes [provider]  atorvastatin (LIPITOR) 40 MG tablet Take 1 tablet (40 mg total) by mouth at bedtime. Patient taking differently: Take 40 mg by mouth daily.  02/17/18  Yes Tawny Asal, MD  carvedilol (COREG) 12.5 MG tablet Take 1 tablet (12.5 mg total) by mouth 2 (two) times daily with a meal. 02/17/18  Yes Tawny Asal, MD  diazepam (VALIUM) 5 MG tablet Take 5 mg by mouth 3 (three) times daily.    Yes [provider]  DULoxetine (CYMBALTA) 60 MG capsule Take 60 mg by mouth 2 (two) times daily.    Yes [provider]  escitalopram (LEXAPRO) 10 MG tablet Take 10 mg by mouth daily.   Yes [provider]  gabapentin (NEURONTIN) 800 MG tablet Take 800 mg by mouth 4 (four) times daily.   Yes [provider]  hydrochlorothiazide (HYDRODIURIL) 25 MG tablet Take 1 tablet (25 mg total) by mouth daily. 02/17/18  Yes Tawny Asal, MD  hydrOXYzine (ATARAX/VISTARIL) 50 MG tablet Take 50 mg by mouth See admin instructions. Take 50 mg by mouth at bedtime and an additional 50 mg two times a day as needed for anxiety   Yes [provider]  losartan (COZAAR) 100 MG tablet Take 1 tablet (100 mg total) by mouth  daily. 01/06/19 01/01/20 Yes Nahser, Wonda Cheng, MD  Lurasidone HCl (LATUDA) 120 MG TABS Take 120 mg by mouth 2 (two) times daily.   Yes [provider]  potassium chloride (K-DUR) 10 MEQ tablet Take 10 mEq by mouth daily. 10/19/18  Yes [provider]  vitamin E (VITAMIN E) 400 UNIT capsule Take 800 Units by mouth 2 (two) times daily.   Yes [provider]  XARELTO 10 MG TABS tablet Take 10 mg by mouth daily. 10/19/18  Yes [provider]     Vital Signs: BP (!) 158/92   Pulse 90   Temp 97.8 F (36.6 C) (Oral)   Resp 17   Ht 5\' 4"  (1.626 m)   Wt 182 lb 1.6 oz (82.6 kg)   LMP 11/22/2015   SpO2 94%   BMI 31.26 kg/m   Physical Exam Vitals signs reviewed.  Skin:    General: Skin is warm and dry.     Comments: Site of left arm extravasation is noted to have minimal swelling No redness No bruising Tender to touch site per pt. Skin is intact; no blisters Left arm with good pulses and cap refill FROM of fingers and elbow Good sensation in hand and fingers   Neurological:     Mental Status: She is alert.     Imaging: Ct Angio Ao+bifem W & Or Wo Contrast  Addendum Date: 02/14/2019   ADDENDUM REPORT: 02/14/2019 21:11 CONTRAST EXTRAVASATION CONSULTATION: Type  of contrast:  Isovue 300 Site of extravasation: LEFT arm Estimated volume of extravasation: 20 or less ml Area of extravasation scanned with CT? no PATIENT'S SIGNS AND SYMPTOMS Skin blistering/ulceration: no Decrease capillary refill: no Change in skin color: no Decreased motor function or severe tightness: no Decreased pulses distal to site of extravasation: no Altered sensation: no Increasing pain or signs of increased swelling during observation: no TREATMENT Limb elevation: Ordered Ice packs applied: yes Heat pads applied: No Plastic surgery consulted? no DOCUMENTATION AND FOLLOW-UP Post extravasation orders completed? yes Was additional follow up assigned to PA's? Yes Patient's questions  answered? yes Electronically Signed   By: Franki Cabot M.D.   On: 02/14/2019 21:11   Result Date: 02/14/2019 CLINICAL DATA:  Cold painful leg for 3 days. Gangrene of LEFT foot. History of PVD, hypertension, DVT, DJD, femoropopliteal bypass graft common femoral endarterectomy and RIGHT external iliac artery shunt. EXAM: CT ANGIOGRAPHY AOBIFEM WITHOUT AND WITH CONTRAST TECHNIQUE: After administration of intravascular contrast, axial images obtained from the level of the lung bases through the feet. Coronal in sagittal reconstructions performed. CONTRAST:  125 cc Isovue 370 COMPARISON:  CT abdomen dated 03/28/2015. CT runoff dated 10/03/2008. FINDINGS: VASCULAR: Extensive atherosclerosis of the abdominal aorta. There is near occlusion of the abdominal aorta just above the aortic bifurcation (at least 75-80% stenosis). This is new compared to the patient's most recent CT from 2016. 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 Bulbous configuration at the origin of the LEFT common femoral artery, compatible with the given history of femoral-popliteal bypass graft. No contrast flow appreciated within the native SFA or presumed bypass graft. Reconstitution of flow at the level of the LEFT popliteal artery, presumably via collaterals, with diminutive flow seen to the level of the LEFT ankle via the trifurcation vessels. Flow is more attenuated than on previous exams, presumably related to the stenosis at the level of the lower abdominal aorta. Paucity of flow within the LEFT foot limits further characterization. RIGHT LOWER EXTREMITY: There is extensive atherosclerosis of the RIGHT common iliac, external iliac common femoral and superficial femoral arteries. There is a RIGHT external iliac artery stent which appears to be patent with contrast flow seen to the RIGHT common femoral and superficial femoral arteries, although there are multifocal stenoses throughout.  Contrast flow is present through the RIGHT lower extremity vessels to the level of the RIGHT ankle, but the flow is diminished compared to previous exams. As on the LEFT, paucity of flow within the RIGHT foot limits further characterization. OSSEOUS STRUCTURES: No destructive changes appreciated to confirm an osteomyelitis. No acute or suspicious osseous lesion. No soft tissue abscess collection identified within either extremity. No soft tissue gas identified. ABDOMEN AND PELVIS: No focal liver abnormality. Gallbladder is unremarkable. Pancreas is unremarkable. Spleen appears normal. Kidneys and adrenal glands are unremarkable. No dilated large or small bowel loops. Scattered diverticulosis of the descending and sigmoid colon but no focal inflammatory change to suggest acute diverticulitis. No free fluid or abscess collection. No free intraperitoneal air. Osseous structures of the abdomen and pelvis are unremarkable. Review of the MIP images confirms the above findings. IMPRESSION: VASCULAR 1. 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 2. 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 3. No contrast flow appreciated within the LEFT native SFA or presumed fem-pop bypass  graft. Reconstitution of flow at the level of the LEFT popliteal artery, via collaterals, with diminutive flow seen to the level of the LEFT ankle. Flow is more attenuated than on previous exams, presumably related to the stenosis at the level of the lower abdominal aorta. 4. RIGHT external iliac artery stent appears to be patent with contrast flow seen to the RIGHT common femoral and superficial femoral arteries, although there are multifocal stenoses throughout. RIGHT SFA is chronically occluded. There is again above-knee RIGHT popliteal artery reconstitution via profundus femoral collaterals, but the flow  to the trifurcation is attenuated compared to previous exams. This attenuation is also presumably related to the aortic stenosis. 5. Colonic diverticulosis without evidence of acute diverticulitis. Patient had a LEFT arm contrast extravasation. Based on the quality of this exam, is questionable that more than 20 cc was extravasated. Patient was examined by myself. LEFT arm was soft with no skin discoloration. Patient initially felt some tightness but this quickly resolved. Patient described no pain with palpation. Ice was applied. Patient was returned to the floor and orders will be placed for follow-up/observation. These results were called by telephone at the time of interpretation on 02/14/2019 at 8:45 pm to Dr. Ruta Hinds , who verbally acknowledged these results. Ordering physician was made aware of the LEFT arm contrast extravasation described above. Electronically Signed: By: Franki Cabot M.D. On: 02/14/2019 20:59   Dg Chest Port 1 View  Result Date: 02/14/2019 CLINICAL DATA:  Preoperative study. EXAM: PORTABLE CHEST 1 VIEW COMPARISON:  February 15, 2018 FINDINGS: The heart size and mediastinal contours are within normal limits. Both lungs are clear. The visualized skeletal structures are unremarkable. IMPRESSION: No active disease. Electronically Signed   By: Dorise Bullion III M.D   On: 02/14/2019 18:05    Labs:  CBC: Recent Labs    02/14/19 1655  WBC 11.7*  HGB 15.8*  HCT 48.7*  PLT 207    COAGS: Recent Labs    02/14/19 1655 02/15/19 0051  INR 1.07  --   APTT  --  144*    BMP: Recent Labs    02/16/18 0623 02/14/19 1655  NA 141 137  K 4.2 3.2*  CL 106 99  CO2 25 24  GLUCOSE 110* 134*  BUN 13 19  CALCIUM 9.1 9.9  CREATININE 1.04* 1.01*  GFRNONAA 59* >60  GFRAA >60 >60    LIVER FUNCTION TESTS: Recent Labs    02/14/19 1655  BILITOT 1.2  AST 51*  ALT 33  ALKPHOS 71  PROT 7.6  ALBUMIN 3.9    Assessment and Plan:  Left arm CT contrast  extravasation Elevate arm Reassurance to pt IR PA will recheck in am  Electronically Signed: Lavonia Drafts, PA-C 02/15/2019, 10:10 AM   I spent a total of 15 Minutes at the the patient's bedside AND on the patient's hospital floor or unit, greater than 50% of which was counseling/coordinating care for left arm CT contrast extravasation

## 2019-02-15 NOTE — Progress Notes (Signed)
At shift change notified by pervious RN that pt BPs were in the 70-80s/-60s, pt on PCA unacceptable, frequently drowsy, and drifting to sleep during conversation. 3 attempts at pushing PCA in 4 hour period receiving 4.5 mg of morphine. Pt BP very soft after 500cc bolus. Called on call MD Dr. Carlis Abbott and made him aware of pt condition and that I didn't feel comfortable leaving her on PCA for the next 12 hours. He ordered to stop PCA pump, add another bolus, and add Morphine IV push 2mg  q2h PRN for pain to the [facility]. Pt currently getting bolus. pt pressures ranging from 86-90s/60-70s. Will continue to monitor. I am cycling pt BPs q1h as she is still on [place] 2L and RR, O2 sats, and CO2 are being monitored.

## 2019-02-16 ENCOUNTER — Encounter (HOSPITAL_COMMUNITY): Payer: Self-pay | Admitting: Anesthesiology

## 2019-02-16 ENCOUNTER — Inpatient Hospital Stay (HOSPITAL_COMMUNITY): Payer: Medicare Other | Admitting: Anesthesiology

## 2019-02-16 ENCOUNTER — Encounter (HOSPITAL_COMMUNITY)
Admission: EM | Disposition: A | Discharge status: Skilled Nursing Facility | Payer: Self-pay | Source: Home / Self Care | Attending: Vascular Surgery

## 2019-02-16 DIAGNOSIS — I96 Gangrene, not elsewhere classified: Secondary | ICD-10-CM

## 2019-02-16 HISTORY — PX: AMPUTATION: SHX166

## 2019-02-16 LAB — SURGICAL PCR SCREEN
MRSA, PCR: NEGATIVE
STAPHYLOCOCCUS AUREUS: NEGATIVE

## 2019-02-16 LAB — CBC
HCT: 40.8 % (ref 36.0–46.0)
Hemoglobin: 13 g/dL (ref 12.0–15.0)
MCH: 31.3 pg (ref 26.0–34.0)
MCHC: 31.9 g/dL (ref 30.0–36.0)
MCV: 98.3 fL (ref 80.0–100.0)
Platelets: 184 10*3/uL (ref 150–400)
RBC: 4.15 MIL/uL (ref 3.87–5.11)
RDW: 12.4 % (ref 11.5–15.5)
WBC: 12.1 10*3/uL — ABNORMAL HIGH (ref 4.0–10.5)
nRBC: 0 % (ref 0.0–0.2)

## 2019-02-16 LAB — APTT
APTT: 114 s — AB (ref 24–36)
aPTT: 78 seconds — ABNORMAL HIGH (ref 24–36)

## 2019-02-16 LAB — HEPARIN LEVEL (UNFRACTIONATED): Heparin Unfractionated: 0.7 IU/mL (ref 0.30–0.70)

## 2019-02-16 SURGERY — AMPUTATION, ABOVE KNEE
Anesthesia: General | Site: Leg Upper | Laterality: Left

## 2019-02-16 MED ORDER — ACETAMINOPHEN 500 MG PO TABS
ORAL_TABLET | ORAL | Status: AC
  Administered 2019-02-16: 1000 mg via ORAL
  Filled 2019-02-16: qty 2

## 2019-02-16 MED ORDER — SODIUM CHLORIDE 0.9% FLUSH
3.0000 mL | Freq: Two times a day (BID) | INTRAVENOUS | Status: DC
  Administered 2019-02-16 – 2019-02-18 (×4): 3 mL via INTRAVENOUS

## 2019-02-16 MED ORDER — PROMETHAZINE HCL 25 MG/ML IJ SOLN: 6.2500 mg | INTRAMUSCULAR | Status: DC | PRN

## 2019-02-16 MED ORDER — MIDAZOLAM HCL 2 MG/2ML IJ SOLN
INTRAMUSCULAR | Status: AC
  Filled 2019-02-16: qty 2

## 2019-02-16 MED ORDER — LIDOCAINE 2% (20 MG/ML) 5 ML SYRINGE
INTRAMUSCULAR | Status: DC | PRN
  Administered 2019-02-16: 100 mg via INTRAVENOUS

## 2019-02-16 MED ORDER — DOCUSATE SODIUM 100 MG PO CAPS
100.0000 mg | ORAL_CAPSULE | Freq: Every day | ORAL | Status: DC
  Administered 2019-02-17 – 2019-02-19 (×3): 100 mg via ORAL
  Filled 2019-02-16 (×3): qty 1

## 2019-02-16 MED ORDER — ACETAMINOPHEN 500 MG PO TABS
1000.0000 mg | ORAL_TABLET | Freq: Once | ORAL | Status: AC
  Administered 2019-02-16: 1000 mg via ORAL
  Filled 2019-02-16: qty 2

## 2019-02-16 MED ORDER — MIDAZOLAM HCL 5 MG/5ML IJ SOLN
INTRAMUSCULAR | Status: DC | PRN
  Administered 2019-02-16 (×2): 1 mg via INTRAVENOUS

## 2019-02-16 MED ORDER — PROPOFOL 10 MG/ML IV BOLUS
INTRAVENOUS | Status: AC
  Filled 2019-02-16: qty 20

## 2019-02-16 MED ORDER — ACETAMINOPHEN 325 MG RE SUPP: 325.0000 mg | RECTAL | Status: DC | PRN

## 2019-02-16 MED ORDER — LACTATED RINGERS IV SOLN
INTRAVENOUS | Status: DC | PRN
  Administered 2019-02-16: 09:00:00 via INTRAVENOUS

## 2019-02-16 MED ORDER — LABETALOL HCL 5 MG/ML IV SOLN: 10.0000 mg | INTRAVENOUS | Status: DC | PRN

## 2019-02-16 MED ORDER — MORPHINE SULFATE (PF) 2 MG/ML IV SOLN
2.0000 mg | INTRAVENOUS | Status: DC | PRN
  Administered 2019-02-16 – 2019-02-18 (×5): 2 mg via INTRAVENOUS
  Filled 2019-02-16 (×5): qty 1

## 2019-02-16 MED ORDER — CEFAZOLIN SODIUM-DEXTROSE 2-4 GM/100ML-% IV SOLN
2.0000 g | Freq: Three times a day (TID) | INTRAVENOUS | Status: AC
  Administered 2019-02-16 (×2): 2 g via INTRAVENOUS
  Filled 2019-02-16 (×2): qty 100

## 2019-02-16 MED ORDER — SODIUM CHLORIDE 0.9 % IV SOLN: 250.0000 mL | INTRAVENOUS | Status: DC | PRN

## 2019-02-16 MED ORDER — PHENOL 1.4 % MT LIQD: 1.0000 | OROMUCOSAL | Status: DC | PRN

## 2019-02-16 MED ORDER — METOPROLOL TARTRATE 5 MG/5ML IV SOLN: 2.0000 mg | INTRAVENOUS | Status: DC | PRN

## 2019-02-16 MED ORDER — PHENYLEPHRINE 40 MCG/ML (10ML) SYRINGE FOR IV PUSH (FOR BLOOD PRESSURE SUPPORT)
PREFILLED_SYRINGE | INTRAVENOUS | Status: DC | PRN
  Administered 2019-02-16 (×2): 80 ug via INTRAVENOUS
  Administered 2019-02-16: 120 ug via INTRAVENOUS
  Administered 2019-02-16: 80 ug via INTRAVENOUS

## 2019-02-16 MED ORDER — POTASSIUM CHLORIDE CRYS ER 20 MEQ PO TBCR: 20.0000 meq | EXTENDED_RELEASE_TABLET | Freq: Once | ORAL | Status: DC | PRN

## 2019-02-16 MED ORDER — ALUM & MAG HYDROXIDE-SIMETH 200-200-20 MG/5ML PO SUSP: 15.0000 mL | ORAL | Status: DC | PRN

## 2019-02-16 MED ORDER — PROPOFOL 10 MG/ML IV BOLUS
INTRAVENOUS | Status: DC | PRN
  Administered 2019-02-16: 200 mg via INTRAVENOUS

## 2019-02-16 MED ORDER — HEPARIN (PORCINE) 25000 UT/250ML-% IV SOLN
1200.0000 [IU]/h | INTRAVENOUS | Status: DC
  Administered 2019-02-16 – 2019-02-17 (×2): 1050 [IU]/h via INTRAVENOUS
  Filled 2019-02-16 (×3): qty 250

## 2019-02-16 MED ORDER — HYDROMORPHONE HCL 1 MG/ML IJ SOLN: 0.2500 mg | INTRAMUSCULAR | Status: DC | PRN

## 2019-02-16 MED ORDER — OXYCODONE-ACETAMINOPHEN 5-325 MG PO TABS
1.0000 | ORAL_TABLET | ORAL | Status: DC | PRN
  Administered 2019-02-16: 1 via ORAL
  Administered 2019-02-16 – 2019-02-18 (×7): 2 via ORAL
  Administered 2019-02-18: 1 via ORAL
  Administered 2019-02-19 (×2): 2 via ORAL
  Filled 2019-02-16 (×2): qty 2
  Filled 2019-02-16: qty 1
  Filled 2019-02-16 (×8): qty 2

## 2019-02-16 MED ORDER — OXYCODONE-ACETAMINOPHEN 5-325 MG PO TABS
ORAL_TABLET | ORAL | Status: AC
  Filled 2019-02-16: qty 2

## 2019-02-16 MED ORDER — HYDRALAZINE HCL 20 MG/ML IJ SOLN: 5.0000 mg | INTRAMUSCULAR | Status: DC | PRN

## 2019-02-16 MED ORDER — PANTOPRAZOLE SODIUM 40 MG PO TBEC: 40.0000 mg | DELAYED_RELEASE_TABLET | Freq: Every day | ORAL | Status: DC

## 2019-02-16 MED ORDER — SODIUM CHLORIDE 0.9% FLUSH: 3.0000 mL | INTRAVENOUS | Status: DC | PRN

## 2019-02-16 MED ORDER — ACETAMINOPHEN 325 MG PO TABS: 325.0000 mg | ORAL_TABLET | ORAL | Status: DC | PRN

## 2019-02-16 MED ORDER — PROTAMINE SULFATE 10 MG/ML IV SOLN
INTRAVENOUS | Status: DC | PRN
  Administered 2019-02-16: 30 mg via INTRAVENOUS

## 2019-02-16 MED ORDER — BACITRACIN ZINC 500 UNIT/GM EX OINT
TOPICAL_OINTMENT | CUTANEOUS | Status: AC
  Filled 2019-02-16: qty 28.35

## 2019-02-16 MED ORDER — BACITRACIN ZINC 500 UNIT/GM EX OINT
TOPICAL_OINTMENT | CUTANEOUS | Status: DC | PRN
  Administered 2019-02-16: 1 via TOPICAL

## 2019-02-16 MED ORDER — 0.9 % SODIUM CHLORIDE (POUR BTL) OPTIME
TOPICAL | Status: DC | PRN
  Administered 2019-02-16: 1000 mL

## 2019-02-16 MED ORDER — FENTANYL CITRATE (PF) 100 MCG/2ML IJ SOLN
INTRAMUSCULAR | Status: DC | PRN
  Administered 2019-02-16 (×3): 50 ug via INTRAVENOUS

## 2019-02-16 MED ORDER — GUAIFENESIN-DM 100-10 MG/5ML PO SYRP: 15.0000 mL | ORAL_SOLUTION | ORAL | Status: DC | PRN

## 2019-02-16 MED ORDER — ONDANSETRON HCL 4 MG/2ML IJ SOLN: 4.0000 mg | Freq: Four times a day (QID) | INTRAMUSCULAR | Status: DC | PRN

## 2019-02-16 MED ORDER — MAGNESIUM SULFATE 2 GM/50ML IV SOLN: 2.0000 g | Freq: Once | INTRAVENOUS | Status: DC | PRN

## 2019-02-16 MED ORDER — ONDANSETRON HCL 4 MG/2ML IJ SOLN
INTRAMUSCULAR | Status: DC | PRN
  Administered 2019-02-16: 4 mg via INTRAVENOUS

## 2019-02-16 MED ORDER — FENTANYL CITRATE (PF) 250 MCG/5ML IJ SOLN
INTRAMUSCULAR | Status: AC
  Filled 2019-02-16: qty 5

## 2019-02-16 MED ORDER — LACTATED RINGERS IV SOLN
INTRAVENOUS | Status: DC
  Administered 2019-02-16: 09:00:00 via INTRAVENOUS

## 2019-02-16 SURGICAL SUPPLY — 56 items
BANDAGE ACE 4X5 VEL STRL LF (GAUZE/BANDAGES/DRESSINGS) ×3 IMPLANT
BANDAGE ACE 6X5 VEL STRL LF (GAUZE/BANDAGES/DRESSINGS) ×1 IMPLANT
BANDAGE ELASTIC 4 VELCRO ST LF (GAUZE/BANDAGES/DRESSINGS) ×4 IMPLANT
BANDAGE ELASTIC 6 VELCRO ST LF (GAUZE/BANDAGES/DRESSINGS) IMPLANT
BANDAGE ESMARK 6X9 LF (GAUZE/BANDAGES/DRESSINGS) ×1 IMPLANT
BLADE SAW RECIP 87.9 MT (BLADE) ×3 IMPLANT
BNDG CMPR 9X6 STRL LF SNTH (GAUZE/BANDAGES/DRESSINGS) ×1
BNDG COHESIVE 6X5 TAN STRL LF (GAUZE/BANDAGES/DRESSINGS) ×1 IMPLANT
BNDG ESMARK 6X9 LF (GAUZE/BANDAGES/DRESSINGS) ×3
BNDG GAUZE ELAST 4 BULKY (GAUZE/BANDAGES/DRESSINGS) ×5 IMPLANT
CANISTER SUCT 3000ML PPV (MISCELLANEOUS) ×3 IMPLANT
CLIP VESOCCLUDE MED 6/CT (CLIP) ×2 IMPLANT
COVER SURGICAL LIGHT HANDLE (MISCELLANEOUS) ×3 IMPLANT
COVER WAND RF STERILE (DRAPES) ×3 IMPLANT
CUFF TOURNIQUET SINGLE 18IN (TOURNIQUET CUFF) IMPLANT
CUFF TOURNIQUET SINGLE 24IN (TOURNIQUET CUFF) IMPLANT
CUFF TOURNIQUET SINGLE 34IN LL (TOURNIQUET CUFF) ×2 IMPLANT
CUFF TOURNIQUET SINGLE 44IN (TOURNIQUET CUFF) IMPLANT
DRAIN CHANNEL 19F RND (DRAIN) IMPLANT
DRAPE HALF SHEET 40X57 (DRAPES) ×3 IMPLANT
DRAPE ORTHO SPLIT 77X108 STRL (DRAPES) ×6
DRAPE SURG ORHT 6 SPLT 77X108 (DRAPES) ×2 IMPLANT
DRAPE U-SHAPE 47X51 STRL (DRAPES) ×3 IMPLANT
DRSG ADAPTIC 3X8 NADH LF (GAUZE/BANDAGES/DRESSINGS) ×3 IMPLANT
ELECT CAUTERY BLADE 6.4 (BLADE) ×3 IMPLANT
ELECT REM PT RETURN 9FT ADLT (ELECTROSURGICAL) ×3
ELECTRODE REM PT RTRN 9FT ADLT (ELECTROSURGICAL) ×1 IMPLANT
EVACUATOR SILICONE 100CC (DRAIN) IMPLANT
GAUZE SPONGE 4X4 12PLY STRL (GAUZE/BANDAGES/DRESSINGS) ×3 IMPLANT
GAUZE SPONGE 4X4 12PLY STRL LF (GAUZE/BANDAGES/DRESSINGS) ×2 IMPLANT
GLOVE BIO SURGEON STRL SZ7.5 (GLOVE) ×3 IMPLANT
GLOVE BIOGEL PI IND STRL 8 (GLOVE) ×1 IMPLANT
GLOVE BIOGEL PI INDICATOR 8 (GLOVE) ×2
GLOVE SURG SS PI 7.5 STRL IVOR (GLOVE) ×2 IMPLANT
GOWN STRL REUS W/ TWL LRG LVL3 (GOWN DISPOSABLE) ×3 IMPLANT
GOWN STRL REUS W/TWL LRG LVL3 (GOWN DISPOSABLE) ×9
KIT BASIN OR (CUSTOM PROCEDURE TRAY) ×3 IMPLANT
KIT TURNOVER KIT B (KITS) ×3 IMPLANT
NS IRRIG 1000ML POUR BTL (IV SOLUTION) ×3 IMPLANT
PACK GENERAL/GYN (CUSTOM PROCEDURE TRAY) ×3 IMPLANT
PAD ARMBOARD 7.5X6 YLW CONV (MISCELLANEOUS) ×6 IMPLANT
STAPLER VISISTAT (STAPLE) ×3 IMPLANT
STAPLER VISISTAT 35W (STAPLE) ×2 IMPLANT
STOCKINETTE IMPERVIOUS LG (DRAPES) ×3 IMPLANT
SUT ETHILON 3 0 PS 1 (SUTURE) IMPLANT
SUT SILK 0 TIES 10X30 (SUTURE) ×3 IMPLANT
SUT SILK 2 0 (SUTURE) ×3
SUT SILK 2 0 SH CR/8 (SUTURE) ×3 IMPLANT
SUT SILK 2-0 18XBRD TIE 12 (SUTURE) ×1 IMPLANT
SUT SILK 3 0 (SUTURE) ×3
SUT SILK 3-0 18XBRD TIE 12 (SUTURE) ×1 IMPLANT
SUT VIC AB 2-0 CT1 18 (SUTURE) ×5 IMPLANT
TOWEL GREEN STERILE (TOWEL DISPOSABLE) ×6 IMPLANT
TOWEL GREEN STERILE FF (TOWEL DISPOSABLE) ×3 IMPLANT
UNDERPAD 30X30 (UNDERPADS AND DIAPERS) ×3 IMPLANT
WATER STERILE IRR 1000ML POUR (IV SOLUTION) ×3 IMPLANT

## 2019-02-16 NOTE — Progress Notes (Signed)
[place] for IV Heparin Indication: Left Leg Ischemia  Allergies  Allergen Reactions  . Propoxyphene N-Acetaminophen Anaphylaxis and Swelling  . Strawberry Extract Anaphylaxis and Shortness Of Breath    "I go into shock"  . Buspar [Buspirone] Swelling    "Swells my face"  . Grapefruit Extract Other (See Comments)    Interaction with medications she's taking    Patient Measurements: Heparin Dosing Weight: 72.6kg  Vital Signs: Temp: 97.7 F (36.5 C) (02/18 1200) Temp Source: Oral (02/18 0400) BP: 106/77 (02/18 1205) Pulse Rate: 84 (02/18 1205)  Labs: Recent Labs    02/14/19 1655  02/15/19 0051 02/15/19 1538 02/15/19 2238 02/16/19 0637  HGB 15.8*  --   --   --   --  13.0  HCT 48.7*  --   --   --   --  40.8  PLT 207  --   --   --   --  184  APTT  --    < > 144* 109* 114* 78*  LABPROT 13.8  --   --   --   --   --   INR 1.07  --   --   --   --   --   HEPARINUNFRC  --   --  1.46*  --  1.06* 0.70  CREATININE 1.01*  --   --   --   --   --    < > = values in this interval not displayed.    Estimated Creatinine Clearance: 64.7 mL/min (A) (by C-G formula based on SCr of 1.01 mg/dL (H)).  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 Pharmacy consulted to transition to IV Heparin. Last Xarelto dose was on 02/11/19 at 0800 AM per patient as she reports she ran out of the medication 3 days ago (>72 hrs since last dose). Patient has had a prior bypass on this leg.    Pt now s/p L AKA by [facility]VS today - pharmacy to resume heparin at [place] post-op. Heparin levels this am still affected slightly by recent DOAC use, aPTT was therapeutic on 1050 units/hr.   Goal of Therapy:  PTT 66-102 sec Heparin level 0.3-0.7 units/ml Monitor platelets by anticoagulation protocol: Yes   Plan:  Resume heparin 1050 units/hr at [place] aPTT and heparin  level  Arrie Senate, PharmD, BCPS Clinical Pharmacist [phone] Please check AMION for all [place] numbers 02/16/2019

## 2019-02-16 NOTE — Progress Notes (Signed)
Patient received from PACU, A&Ox4, VSS. Resting comfortably, denies any needs at this time.

## 2019-02-16 NOTE — Anesthesia Procedure Notes (Signed)
Procedure Name: LMA Insertion Date/Time: 02/16/2019 9:47 AM Performed by: Lieutenant Diego, CRNA Pre-anesthesia Checklist: Patient identified, Emergency Drugs available, Suction available and Patient being monitored Patient Re-evaluated:Patient Re-evaluated prior to induction Oxygen Delivery Method: Circle system utilized Preoxygenation: Pre-oxygenation with 100% oxygen Induction Type: IV induction Ventilation: Mask ventilation without difficulty LMA: LMA inserted LMA Size: 4.0 Number of attempts: 1 Placement Confirmation: positive ETCO2 and breath sounds checked- equal and bilateral Tube secured with: Tape Dental Injury: Teeth and Oropharynx as per pre-operative assessment

## 2019-02-16 NOTE — Anesthesia Postprocedure Evaluation (Signed)
Anesthesia Post Note  Patient: Alisha [name]  Procedure(s) Performed: AMPUTATION ABOVE KNEE (Left Leg Upper)     Patient location during evaluation: PACU Anesthesia Type: General Level of consciousness: awake and alert Pain management: pain level controlled Vital Signs Assessment: post-procedure vital signs reviewed and stable Respiratory status: spontaneous breathing, nonlabored ventilation, respiratory function stable and patient connected to nasal cannula oxygen Cardiovascular status: blood pressure returned to baseline and stable Postop Assessment: no apparent nausea or vomiting Anesthetic complications: no    Last Vitals:  Vitals:   02/16/19 1200 02/16/19 1205  BP:  106/77  Pulse: 85 84  Resp: (!) 26 15  Temp: 36.5 C   SpO2: 94% 96%    Last Pain:  Vitals:   02/16/19 1200  TempSrc:   PainSc: 5                  Pedro Whiters S

## 2019-02-16 NOTE — Transfer of Care (Signed)
Immediate Anesthesia Transfer of Care Note  Patient: Alisha [name]  Procedure(s) Performed: AMPUTATION ABOVE KNEE (Left Leg Upper)  Patient Location: PACU  Anesthesia Type:General  Level of Consciousness: sedated  Airway & Oxygen Therapy: Patient Spontanous Breathing and Patient connected to face mask oxygen  Post-op Assessment: Report given to RN and Post -op Vital signs reviewed and stable  Post vital signs: Reviewed and stable  Last Vitals:  Vitals Value Taken Time  BP 93/60 02/16/2019 11:06 AM  Temp 36.4 C 02/16/2019 11:05 AM  Pulse 86 02/16/2019 11:14 AM  Resp 10 02/16/2019 11:14 AM  SpO2 99 % 02/16/2019 11:14 AM  Vitals shown include unvalidated device data.  Last Pain:  Vitals:   02/16/19 1105  TempSrc:   PainSc: Asleep      Patients Stated Pain Goal: 3 (14/38/88 7579)  Complications: No apparent anesthesia complications

## 2019-02-16 NOTE — Anesthesia Preprocedure Evaluation (Signed)
Anesthesia Evaluation  Patient identified by MRN, date of birth, ID band Patient awake    Reviewed: Allergy & Precautions, NPO status , Patient's Chart, lab work & pertinent test results  Airway Mallampati: II  TM Distance: >3 FB Neck ROM: Full    Dental no notable dental hx.    Pulmonary Current Smoker,    Pulmonary exam normal breath sounds clear to auscultation       Cardiovascular hypertension, + Peripheral Vascular Disease  Normal cardiovascular exam Rhythm:Regular Rate:Normal     Neuro/Psych Bipolar Disorder Chronic pain syndrome    GI/Hepatic negative GI ROS, Neg liver ROS,   Endo/Other  negative endocrine ROS  Renal/GU negative Renal ROS  negative genitourinary   Musculoskeletal negative musculoskeletal ROS (+)   Abdominal   Peds negative pediatric ROS (+)  Hematology negative hematology ROS (+)   Anesthesia Other Findings   Reproductive/Obstetrics negative OB ROS                             Anesthesia Physical Anesthesia Plan  ASA: III  Anesthesia Plan: General   Post-op Pain Management:    Induction: Intravenous  PONV Risk Score and Plan: 2 and Ondansetron, Dexamethasone and Treatment may vary due to age or medical condition  Airway Management Planned: LMA and Oral ETT  Additional Equipment:   Intra-op Plan:   Post-operative Plan: Extubation in OR  Informed Consent: I have reviewed the patients History and Physical, chart, labs and discussed the procedure including the risks, benefits and alternatives for the proposed anesthesia with the patient or authorized representative who has indicated his/her understanding and acceptance.     Dental advisory given  Plan Discussed with: CRNA and Surgeon  Anesthesia Plan Comments:         Anesthesia Quick Evaluation

## 2019-02-16 NOTE — Progress Notes (Signed)
[place] for IV Heparin Indication: Left Leg Ischemia  Allergies  Allergen Reactions  . Propoxyphene N-Acetaminophen Anaphylaxis and Swelling  . Strawberry Extract Anaphylaxis and Shortness Of Breath    "I go into shock"  . Buspar [Buspirone] Swelling    "Swells my face"  . Grapefruit Extract Other (See Comments)    Interaction with medications she's taking    Patient Measurements: Heparin Dosing Weight: 72.6kg  Vital Signs: Temp: 97.5 F (36.4 C) (02/17 2020) Temp Source: Oral (02/17 2020) BP: 97/60 (02/18 0000) Pulse Rate: 84 (02/17 2020)  Labs: Recent Labs    02/14/19 1655 02/15/19 0051 02/15/19 1538 02/15/19 2238  HGB 15.8*  --   --   --   HCT 48.7*  --   --   --   PLT 207  --   --   --   APTT  --  144* 109* 114*  LABPROT 13.8  --   --   --   INR 1.07  --   --   --   HEPARINUNFRC  --  1.46*  --  1.06*  CREATININE 1.01*  --   --   --     Estimated Creatinine Clearance: 64.7 mL/min (A) (by C-G formula based on SCr of 1.01 mg/dL (H)).  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 Pharmacy consulted to transition to IV Heparin.   Last Xarelto dose was on 02/11/19 at 0800 AM per patient as she reports she ran out of the medication 3 days ago (>72 hrs since last dose). Patient has had a prior bypass on this leg.    Utilizing PTT until Xarelto effects on heparin have worn off. Heparin level 1.06 (appears xarelto still affecting), PTT 114 sec (supratherapeutic) on gtt at 1200 units/hr. No bleeding noted.  Goal of Therapy:  PTT 66-102 sec Heparin level 0.3-0.7 units/ml Monitor platelets by anticoagulation protocol: Yes   Plan:  Reduce heparin gtt to 1050 units/hr Check an 6 hr heparin level/aPTT   Sherlon Handing, PharmD, BCPS Clinical pharmacist  **Pharmacist phone directory can now be found on [email or web address] ([email or web address]).  Listed under [place]. 02/16/2019 12:28 AM

## 2019-02-16 NOTE — Interval H&P Note (Signed)
History and Physical Interval Note:  02/16/2019 9:30 AM  Alisha [name]  has presented today for surgery, with the diagnosis of amputation  The various methods of treatment have been discussed with the patient and family. After consideration of risks, benefits and other options for treatment, the patient has consented to  Procedure(s): AMPUTATION ABOVE KNEE (Left) as a surgical intervention .  The patient's history has been reviewed, patient examined, no change in status, stable for surgery.  I have reviewed the patient's chart and labs.  Questions were answered to the patient's satisfaction.     [name]

## 2019-02-16 NOTE — Op Note (Signed)
    NAME: [name]    MRN: [id number] DOB: 1961/03/19    DATE OF OPERATION: 02/16/2019  PREOP DIAGNOSIS:    Ischemic left leg  POSTOP DIAGNOSIS:    Same  PROCEDURE:    Left above-the-knee amputation  SURGEON: Judeth Cornfield. Scot Dock, MD, FACS  ASSIST: Lafe Garin, RNFA  ANESTHESIA: General  EBL: 100 cc  INDICATIONS:    Alisha [name] is a 58 y.o. female with an ischemic left lower extremity.  She had no further options for revascularization and primary above-the-knee amputation was recommended.  FINDINGS:   Good bleeding at the site of the amputation site.  TECHNIQUE:   The patient was brought to the operating room and received a general anesthetic.  The left leg was prepped and draped in usual sterile fashion.  A tourniquet had been placed on the upper thigh.  A fishmouth incision was marked above the level of the patella.  The leg was exsanguinated with an Esmarch bandage and the tourniquet inflated to 300 mmHg.  Under tourniquet control the incision was carried down to the skin, subcutaneous tissue, fascia, muscle, to the femur which was dissected free circumferentially.  The periosteum was elevated.  The bone was divided proximal to the level of skin division.  There was some bleeding from the marrow so a bone wax was placed.  Hemostasis was obtained in the wound.  The femoral artery and vein were individually suture ligated with 2-0 silk ties.  The wounds were irrigated and additional hemostasis obtained using electrocautery and silk sutures.  The fascial layer was closed with interrupted 2-0 Vicryl's.  The skin was closed with staples.  Sterile dressing was applied.  The patient tolerated the procedure well was transferred to the recovery room in stable condition.  All needle and sponge counts were correct.  [name], MD, FACS [facility]  DATE OF DICTATION:   02/16/2019

## 2019-02-16 NOTE — Progress Notes (Addendum)
RN verified the presence of a signed informed consent that matches stated procedure by patient. Verified armband matches patient's stated name and birth date. Verified NPO status and that all jewelry, contact, glasses, dentures, and partials had been removed (if applicable). Removed a ring in Short Stay.

## 2019-02-17 ENCOUNTER — Encounter (HOSPITAL_COMMUNITY): Payer: Self-pay | Admitting: Vascular Surgery

## 2019-02-17 LAB — HEPARIN LEVEL (UNFRACTIONATED)
HEPARIN UNFRACTIONATED: 0.3 [IU]/mL (ref 0.30–0.70)
Heparin Unfractionated: 0.3 IU/mL (ref 0.30–0.70)

## 2019-02-17 LAB — CBC
HEMATOCRIT: 38.9 % (ref 36.0–46.0)
Hemoglobin: 12.2 g/dL (ref 12.0–15.0)
MCH: 30.8 pg (ref 26.0–34.0)
MCHC: 31.4 g/dL (ref 30.0–36.0)
MCV: 98.2 fL (ref 80.0–100.0)
NRBC: 0 % (ref 0.0–0.2)
Platelets: 150 10*3/uL (ref 150–400)
RBC: 3.96 MIL/uL (ref 3.87–5.11)
RDW: 12 % (ref 11.5–15.5)
WBC: 14.6 10*3/uL — ABNORMAL HIGH (ref 4.0–10.5)

## 2019-02-17 LAB — APTT: aPTT: 60 seconds — ABNORMAL HIGH (ref 24–36)

## 2019-02-17 LAB — BASIC METABOLIC PANEL
ANION GAP: 6 (ref 5–15)
BUN: 11 mg/dL (ref 6–20)
CO2: 28 mmol/L (ref 22–32)
Calcium: 8.7 mg/dL — ABNORMAL LOW (ref 8.9–10.3)
Chloride: 101 mmol/L (ref 98–111)
Creatinine, Ser: 1.03 mg/dL — ABNORMAL HIGH (ref 0.44–1.00)
GFR calc Af Amer: >60 mL/min (ref >60)
GFR calc non Af Amer: >60 mL/min (ref >60)
Glucose, Bld: 112 mg/dL — ABNORMAL HIGH (ref 70–99)
POTASSIUM: 3.9 mmol/L (ref 3.5–5.1)
Sodium: 135 mmol/L (ref 135–145)

## 2019-02-17 NOTE — Progress Notes (Signed)
Alisha [name] for IV Heparin Indication: Left Leg Ischemia  Allergies  Allergen Reactions  . Propoxyphene N-Acetaminophen Anaphylaxis and Swelling  . Strawberry Extract Anaphylaxis and Shortness Of Breath    "I go into shock"  . Buspar [Buspirone] Swelling    "Swells my face"  . Grapefruit Extract Other (See Comments)    Interaction with medications she's taking    Patient Measurements: Heparin Dosing Weight: 72.6kg  Vital Signs: Temp: 97.9 F (36.6 C) (02/18 1943) Temp Source: Oral (02/18 1943) BP: 120/88 (02/18 1943) Pulse Rate: 85 (02/18 1943)  Labs: Recent Labs    02/14/19 1655  02/15/19 2238 02/16/19 0637 02/17/19 0107  HGB 15.8*  --   --  13.0 12.2  HCT 48.7*  --   --  40.8 38.9  PLT 207  --   --  184 150  APTT  --    < > 114* 78* 60*  LABPROT 13.8  --   --   --   --   INR 1.07  --   --   --   --   HEPARINUNFRC  --    < > 1.06* 0.70 0.30  CREATININE 1.01*  --   --   --  1.03*   < > = values in this interval not displayed.    Estimated Creatinine Clearance: 63.4 mL/min (A) (by C-G formula based on SCr of 1.03 mg/dL (H)).  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 Pharmacy consulted to transition to IV Heparin. Last Xarelto dose was on 02/11/19 at 0800 AM per patient as she reports she ran out of the medication 3 days ago (>72 hrs since last dose). Patient has had a prior bypass on this leg.    Pt now s/p L AKA.  APTT is 60 sec and heparin level is 0.3 units/hr  Goal of Therapy:  PTT 66-102 sec Heparin level 0.3-0.7 units/ml Monitor platelets by anticoagulation protocol: Yes   Plan:  Continue heparin at 1050 units/hr Check 6hr heparin level to confirm  Excell Seltzer, PharmD Clinical Pharmacist Please check AMION for all [facility] numbers 02/17/2019

## 2019-02-17 NOTE — Care Management Important Message (Signed)
Important Message  Patient Details  Name: Alisha [name] MRN: [id number] Date of Birth: October 26, 1961   Medicare Important Message Given:  Yes    Prestin Munch P [employer] 02/17/2019, 12:22 PM

## 2019-02-17 NOTE — Evaluation (Signed)
Occupational Therapy Evaluation Patient Details Name: Alisha [name] MRN: [id number] DOB: July 03, 1961 Today's Date: 02/17/2019    History of Present Illness 58 y.o. female is s/p left above knee amputation. PMH includes: PAD, bipolar, DVT (lifelong xarelto, ran out a few days ago), hypertension, hyperlipidemia which are stable.     Clinical Impression   Pt admitted with the above diagnoses and presents with below problem list. Pt will benefit from continued acute OT to address the below listed deficits and maximize independence with basic ADLs prior to d/c to venue below. At baseline, pt is independent with ADLs. Pt is currently mod A with UB ADLs, max A +2 for safety with LB ADLs in sitting/lateral lean position. Pt completed lateral scoot transfers from EOB to drop arm recliner with +2 max physical assist. Pt's daughter present. Pt motivated to work with therapy and eager to regain independence.     Follow Up Recommendations  CIR    Equipment Recommendations  Other (comment)(defer to next venue)    Recommendations for Other Services Rehab consult     Precautions / Restrictions Precautions Precautions: Fall Precaution Comments: L AKA Restrictions Weight Bearing Restrictions: Yes Other Position/Activity Restrictions: NWB LLE      Mobility Bed Mobility Overal bed mobility: Needs Assistance Bed Mobility: Supine to Sit     Supine to sit: Max assist;HOB elevated     General bed mobility comments: cues for technique, assist to powerup trunk. pad utilied to pivot hips to full EOB position. Cues for trunk flexion.   Transfers Overall transfer level: Needs assistance   Transfers: Lateral/Scoot Transfers          Lateral/Scoot Transfers: Max assist;+2 physical assistance;From elevated surface General transfer comment: From EOB to drop arm recliner. Transferred towards pt's right side. Bed pad heavily utilized. Cues for trunk flexion. Pt with poor trunk  control/strength.     Balance Overall balance assessment: Needs assistance Sitting-balance support: Bilateral upper extremity supported;Feet supported Sitting balance-Leahy Scale: Poor Sitting balance - Comments: Sat EOB about 5 minutes with poor improving to fair then fatiguing to poor sitting balance. Poor trunk control/strength.  Posterior lean with cueing needed for trunk flexion. Postural control: Posterior lean                                 ADL either performed or assessed with clinical judgement   ADL Overall ADL's : Needs assistance/impaired Eating/Feeding: Set up;Sitting   Grooming: Sitting;Moderate assistance Grooming Details (indicate cue type and reason): unsupported sitting Upper Body Bathing: Sitting;Moderate assistance Upper Body Bathing Details (indicate cue type and reason): unsupported sitting Lower Body Bathing: Maximal assistance;Sitting/lateral leans;+2 for safety/equipment   Upper Body Dressing : Moderate assistance;Sitting Upper Body Dressing Details (indicate cue type and reason): unsupported sitting Lower Body Dressing: Maximal assistance;Sitting/lateral leans;+2 for safety/equipment                 General ADL Comments: Pt completed bed mobility, sat EOB about 5 minutes, then lateral scoot from EOB to drop-arm recliner with +2 physical assist and use of bed pad to advance     Vision         Perception     Praxis      Pertinent Vitals/Pain Pain Assessment: Faces Faces Pain Scale: Hurts even more Pain Location: LLE  Pain Descriptors / Indicators: Tightness;Sore;Operative site guarding;Grimacing Pain Intervention(s): Limited activity within patient's tolerance;Monitored during session;Repositioned;Premedicated before session;Utilized relaxation techniques  Hand Dominance Right   Extremity/Trunk Assessment Upper Extremity Assessment Upper Extremity Assessment: Generalized weakness   Lower Extremity Assessment Lower  Extremity Assessment: Defer to PT evaluation   Cervical / Trunk Assessment Cervical / Trunk Assessment: Normal   Communication Communication Communication: No difficulties   Cognition Arousal/Alertness: Awake/alert Behavior During Therapy: WFL for tasks assessed/performed Overall Cognitive Status: Within Functional Limits for tasks assessed                                     General Comments  Daughter present. Pt on RA with O2 92-95 throughout session. HR 100-102    Exercises     Shoulder Instructions      Home Living Family/patient expects to be discharged to:: Private residence Living Arrangements: Alone   Type of Home: Apartment Home Access: Stairs to enter [employer] of Steps: 5 Entrance Stairs-Rails: Right Home Layout: One level     Bathroom Shower/Tub: Teacher, early years/pre: Standard     Home Equipment: Shower seat;Cane - single point;Walker - 4 wheels   Additional Comments: pt has 3 daughters who live locally, 2 of whom are CNA/PCA.      Prior Functioning/Environment Level of Independence: Independent        Comments: occasional use of cane or walker.         OT Problem List: Decreased activity tolerance;Decreased strength;Impaired balance (sitting and/or standing);Decreased knowledge of use of DME or AE;Decreased knowledge of precautions;Pain      OT Treatment/Interventions: Self-care/ADL training;Therapeutic exercise;DME and/or AE instruction;Energy conservation;Therapeutic activities;Patient/family education;Balance training    OT Goals(Current goals can be found in the care plan section) Acute Rehab OT Goals Patient Stated Goal: regain independence OT Goal Formulation: With patient/family Time For Goal Achievement: 03/03/19 Potential to Achieve Goals: Good ADL Goals Pt Will Perform Grooming: with modified independence;sitting Pt Will Perform Upper Body Bathing: with modified independence;sitting Pt  Will Perform Lower Body Bathing: sitting/lateral leans;with min assist Pt Will Perform Upper Body Dressing: with set-up;sitting;with supervision Pt Will Perform Lower Body Dressing: with min assist;sitting/lateral leans Pt Will Transfer to Toilet: with min assist;with transfer board;bedside commode Pt Will Perform Toileting - Clothing Manipulation and hygiene: with min assist;sitting/lateral leans Additional ADL Goal #1: Pt will complete bed mobility at min A level to prepare for OOB ADLs.  OT Frequency: Min 3X/week   Barriers to D/C:            Co-evaluation PT/OT/SLP Co-Evaluation/Treatment: Yes Reason for Co-Treatment: To address functional/ADL transfers;For patient/therapist safety   OT goals addressed during session: ADL's and self-care;Proper use of Adaptive equipment and DME;Strengthening/ROM      AM-PAC OT "6 Clicks" Daily Activity     Outcome Measure Help from another person eating meals?: None Help from another person taking care of personal grooming?: A Little Help from another person toileting, which includes using toliet, bedpan, or urinal?: Total Help from another person bathing (including washing, rinsing, drying)?: A Lot Help from another person to put on and taking off regular upper body clothing?: A Lot Help from another person to put on and taking off regular lower body clothing?: A Little 6 Click Score: 15   End of Session Nurse Communication: Other (comment)(NT: how to get pt back to bed, purwick is out)  Activity Tolerance: Patient limited by fatigue;Patient tolerated treatment well Patient left: in chair;with call bell/phone within reach;with family/visitor present  OT Visit Diagnosis: Other  abnormalities of gait and mobility (R26.89);Muscle weakness (generalized) (M62.81);Pain Pain - Right/Left: Left Pain - part of body: Leg                Time: 3958-4417 OT Time Calculation (min): 21 min Charges:  OT General Charges $OT Visit: 1 Visit OT  Evaluation $OT Eval Moderate Complexity: [place], OT Acute Rehabilitation Services Pager: [phone] Office: [phone]   Hortencia Pilar 02/17/2019, 9:51 AM

## 2019-02-17 NOTE — Progress Notes (Addendum)
  Progress Note  02/17/2019 7:50 AM 1 Day Post-Op  Subjective:  Says she is having pain, but feels better knowing the infection is gone.   Afebrile HR 80's-100's NSR 295'M-841'L systolic 24% [id number]  Vitals:   02/16/19 1943 02/17/19 0316  BP: 120/88 (!) 145/93  Pulse: 85 (!) 102  Resp: 15 20  Temp: 97.9 F (36.6 C) 98.3 F (36.8 C)  SpO2: 97% 99%    Physical Exam: Incisions:  Bandage is clean and dry  CBC    Component Value Date/Time   WBC 14.6 (H) 02/17/2019 0107   RBC 3.96 02/17/2019 0107   HGB 12.2 02/17/2019 0107   HCT 38.9 02/17/2019 0107   PLT 150 02/17/2019 0107   MCV 98.2 02/17/2019 0107   MCH 30.8 02/17/2019 0107   MCHC 31.4 02/17/2019 0107   RDW 12.0 02/17/2019 0107   LYMPHSABS 5.6 (H) 02/15/2018 0636   MONOABS 0.6 02/15/2018 0636   EOSABS 0.3 02/15/2018 0636   BASOSABS 0.1 02/15/2018 0636    BMET    Component Value Date/Time   NA 135 02/17/2019 0107   K 3.9 02/17/2019 0107   CL 101 02/17/2019 0107   CO2 28 02/17/2019 0107   GLUCOSE 112 (H) 02/17/2019 0107   BUN 11 02/17/2019 0107   CREATININE 1.03 (H) 02/17/2019 0107   CREATININE 0.83 07/16/2013 1105   CALCIUM 8.7 (L) 02/17/2019 0107   GFRNONAA >60 02/17/2019 0107   GFRNONAA >60 07/29/2011 1220   GFRAA >60 02/17/2019 0107   GFRAA >60 07/29/2011 1220    INR    Component Value Date/Time   INR 1.07 02/14/2019 1655     Intake/Output Summary (Last 24 hours) at 02/17/2019 0750 Last data filed at 02/17/2019 0340 Gross per 24 hour  Intake 24492.67 ml  Output 1100 ml  Net 23392.67 ml     Assessment/Plan:  58 y.o. female is s/p left above knee amputation  1 Day Post-Op  -pt with post operative pain-continue pain control. -bandage is clean and dry and not too tight.  Will take down bandage tomorrow.   [name], PA-C [facility] [phone] 02/17/2019 7:50 AM  I have interviewed the patient and examined the patient. I agree with the findings by the  PA. Dressing change tomorrow. She will need to follow-up as an outpatient with Dr. 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  She has a subtotal occlusion of the aorta and on the right side has a patent right external iliac artery stent with an occluded right superficial femoral artery with reconstitution of a below-knee popliteal artery and three-vessel runoff although these tibial vessels are small.  Gae Gallop, MD [phone]

## 2019-02-17 NOTE — Progress Notes (Signed)
Inpatient Rehabilitation Admissions Coordinator  Inpatient Acute Rehab consult received. I met with patient with her daughter, Alisha [name], at bedside and spoke with her daughter, Alisha [name] by phone per her request. Pt's apartment is not wheelchair accessible and family currently does not have wheelchair accessible homes for patient to d/c home to after a short Rehab stay. Alisha [name] is requesting SNF rehab to give pt a longer recuperation period. I will contact RN CM and SW to make request for SNF. We will sign off at this time.  Danne Baxter, RN, MSN Rehab Admissions Coordinator [phone] 02/17/2019 10:16 AM

## 2019-02-17 NOTE — Consult Note (Signed)
[facility] CM Primary Care Navigator  02/17/2019  Carys Janyia Guion 1961/12/29 [id number]   Went to seepatientat the bedside and met with daughter Hubert Azure) as well, toidentify possible discharge needs.  Patient reports that she had switched to a new primary care provider and has been seen in their office in the last 4 months. PCP is Dr. Nolene Ebbs ([facility] provider and not an affiliate of [place]).  No [facility] care management needs identifiable at this point.  Patientwas encouraged to follow-up with her primary care provider when she returns back home in order to get help in managing her health issues/ needs.   For additional questions please contact:  Edwena Felty A. Micheale Schlack, BSN, RN-BC [facility] PRIMARY CARE Navigator Cell: [phone]

## 2019-02-17 NOTE — Progress Notes (Signed)
[facility]   Subjective  - leg is sore   Objective 128/77 (!) 101 98.3 F (36.8 C) (Oral) 13 97%  Intake/Output Summary (Last 24 hours) at 02/17/2019 0853 Last data filed at 02/17/2019 0340 Gross per 24 hour  Intake 24492.67 ml  Output 1100 ml  Net 23392.67 ml   Dressing dry left leg Right foot pink warm  Assessment/Planning: POD #1 s/p AKA Will get rehab consult.  Should be ready for Rehab Thurs Fri Currently her right leg is asymptomatic will follow for now  Ruta Hinds 02/17/2019 8:53 AM --  Laboratory Lab Results: Recent Labs    02/16/19 0637 02/17/19 0107  WBC 12.1* 14.6*  HGB 13.0 12.2  HCT 40.8 38.9  PLT 184 150   BMET Recent Labs    02/14/19 1655 02/17/19 0107  NA 137 135  K 3.2* 3.9  CL 99 101  CO2 24 28  GLUCOSE 134* 112*  BUN 19 11  CREATININE 1.01* 1.03*  CALCIUM 9.9 8.7*    COAG Lab Results  Component Value Date   INR 1.07 02/14/2019   INR 0.95 02/15/2018   INR 1.05 11/04/2017   No results found for: PTT

## 2019-02-17 NOTE — Evaluation (Addendum)
Physical Therapy Evaluation Patient Details Name: Alisha [name] MRN: [id number] DOB: 12/02/1961 Today's Date: 02/17/2019   History of Present Illness  58 y.o. female is s/p left above knee amputation. PMH includes: PAD, bipolar, DVT (lifelong xarelto, ran out a few days ago), hypertension, hyperlipidemia which are stable.      Clinical Impression  Pt admitted with above diagnosis. Pt currently with functional limitations due to the deficits listed below (see PT Problem List). PTA pt lived alone independent. On eval, pt required max assist bed mobility and +2 max assist lateral scoot transfer. Pt demonstrated decreased sitting balance and trunk strength EOB. Pt with c/o pain LLE with any movement or palpation. Pt will benefit from skilled PT to increase their independence and safety with mobility to allow discharge to the venue listed below.       Follow Up Recommendations CIR    Equipment Recommendations  Other (comment)(defer to next venue)    Recommendations for Other Services Rehab consult     Precautions / Restrictions Precautions Precautions: Fall Precaution Comments: L AKA Restrictions Weight Bearing Restrictions: Yes Other Position/Activity Restrictions: NWB LLE      Mobility  Bed Mobility Overal bed mobility: Needs Assistance Bed Mobility: Supine to Sit     Supine to sit: Max assist;HOB elevated     General bed mobility comments: +rail, cues for sequencing, assist to elevate trunk, use of bed pad to scoot to EOB  Transfers Overall transfer level: Needs assistance   Transfers: Lateral/Scoot Transfers          Lateral/Scoot Transfers: Max assist;+2 physical assistance;From elevated surface General transfer comment: lateral scoot bed to drop-arm recliner toward right. Heavy reliance on bed pad. Pt with poor trunk control/strength.  Ambulation/Gait             General Gait Details: unable  Stairs            Wheelchair Mobility     Modified Rankin (Stroke Patients Only)       Balance Overall balance assessment: Needs assistance Sitting-balance support: Bilateral upper extremity supported;Feet supported Sitting balance-Leahy Scale: Poor Sitting balance - Comments: Sat EOB about 5 minutes with poor improving to fair then fatiguing to poor sitting balance. Poor trunk control/strength.  Posterior lean with cueing needed for trunk flexion. Postural control: Posterior lean                                   Pertinent Vitals/Pain Pain Assessment: Faces Faces Pain Scale: Hurts even more Pain Location: LLE  Pain Descriptors / Indicators: Tightness;Sore;Operative site guarding;Grimacing Pain Intervention(s): Limited activity within patient's tolerance;Repositioned;Monitored during session;Utilized relaxation techniques    Home Living Family/patient expects to be discharged to:: Private residence Living Arrangements: Alone Available Help at Discharge: Family;Available 24 hours/day Type of Home: Apartment Home Access: Stairs to enter Entrance Stairs-Rails: Right Entrance Stairs-Number of Steps: 5 Home Layout: One level Home Equipment: Shower seat;Cane - single point;Walker - 4 wheels Additional Comments: pt has 3 daughters who live locally, 2 of whom are CNA/PCA.    Prior Function Level of Independence: Independent         Comments: occasional use of cane or walker. Used motorized scooter in grocery store.     Hand Dominance   Dominant Hand: Right    Extremity/Trunk Assessment   Upper Extremity Assessment Upper Extremity Assessment: Generalized weakness    Lower Extremity Assessment Lower Extremity Assessment: LLE deficits/detail LLE  Deficits / Details: s/p AKA LLE: Unable to fully assess due to pain    Cervical / Trunk Assessment Cervical / Trunk Assessment: Normal  Communication   Communication: No difficulties  Cognition Arousal/Alertness: Awake/alert Behavior During  Therapy: WFL for tasks assessed/performed Overall Cognitive Status: Within Functional Limits for tasks assessed                                        General Comments General comments (skin integrity, edema, etc.): Daughter present during session. SpO2 92-95% on RA. HR in 100s.    Exercises     Assessment/Plan    PT Assessment Patient needs continued PT services  PT Problem List Decreased strength;Decreased balance;Pain;Decreased mobility;Decreased activity tolerance;Decreased knowledge of precautions;Decreased coordination       PT Treatment Interventions DME instruction;Functional mobility training;Balance training;Patient/family education;Gait training;Therapeutic activities;Neuromuscular re-education;Therapeutic exercise    PT Goals (Current goals can be found in the Care Plan section)  Acute Rehab PT Goals Patient Stated Goal: regain independence PT Goal Formulation: With patient/family Time For Goal Achievement: 03/03/19 Potential to Achieve Goals: Good    Frequency Min 3X/week   Barriers to discharge Inaccessible home environment      Co-evaluation PT/OT/SLP Co-Evaluation/Treatment: Yes Reason for Co-Treatment: To address functional/ADL transfers;For patient/therapist safety PT goals addressed during session: Mobility/safety with mobility;Balance OT goals addressed during session: ADL's and self-care;Proper use of Adaptive equipment and DME;Strengthening/ROM       AM-PAC PT "6 Clicks" Mobility  Outcome Measure Help needed turning from your back to your side while in a flat bed without using bedrails?: A Lot Help needed moving from lying on your back to sitting on the side of a flat bed without using bedrails?: A Lot Help needed moving to and from a bed to a chair (including a wheelchair)?: A Lot Help needed standing up from a chair using your arms (e.g., wheelchair or bedside chair)?: Total Help needed to walk in hospital room?: Total Help  needed climbing 3-5 steps with a railing? : Total 6 Click Score: 9    End of Session Equipment Utilized During Treatment: Gait belt Activity Tolerance: Patient limited by pain Patient left: in chair;with call bell/phone within reach;with family/visitor present Nurse Communication: Mobility status;Need for lift equipment PT Visit Diagnosis: Other abnormalities of gait and mobility (R26.89);Pain;Muscle weakness (generalized) (M62.81) Pain - Right/Left: Left Pain - part of body: Leg    Time: 0981-1914 PT Time Calculation (min) (ACUTE ONLY): 27 min   Charges:   PT Evaluation $PT Eval Moderate Complexity: 1 Mod          Lorrin Goodell, PT  Office # [phone] Pager [phone]   Lorriane Shire 02/17/2019, 10:20 AM

## 2019-02-17 NOTE — Progress Notes (Signed)
  Evaluation after Contrast Extravasation  Patient seen in follow-up from contrast extravasation 2/17  Exam: There is no swelling at the left anticuital area.  There is no erythema. There is a bruise adjacent to the site. There are no blisters. There are no signs of decreased perfusion of the skin.  It is not warm to touch.  The patient has full ROM in fingers.  Radial pulse is normal.   The patient understands to call the radiology department if there is: - increase in pain or swelling - changed or altered sensation - ulceration or blistering - increasing redness - warmth or increasing firmness - decreased tissue perfusion as noted by decreased capillary refill or discoloration of skin - decreased pulses peripheral to site   Docia Barrier PA-C 02/17/2019 12:22 PM

## 2019-02-17 NOTE — Progress Notes (Signed)
[place] for IV Heparin Indication: Left Leg Ischemia  Allergies  Allergen Reactions  . Propoxyphene N-Acetaminophen Anaphylaxis and Swelling  . Strawberry Extract Anaphylaxis and Shortness Of Breath    "I go into shock"  . Buspar [Buspirone] Swelling    "Swells my face"  . Grapefruit Extract Other (See Comments)    Interaction with medications she's taking    Patient Measurements: Heparin Dosing Weight: 72.6kg  Vital Signs: Temp: 98.3 F (36.8 C) (02/19 0316) Temp Source: Oral (02/19 0316) BP: 145/93 (02/19 0316) Pulse Rate: 102 (02/19 0316)  Labs: Recent Labs    02/14/19 1655  02/15/19 2238 02/16/19 0637 02/17/19 0107 02/17/19 0729  HGB 15.8*  --   --  13.0 12.2  --   HCT 48.7*  --   --  40.8 38.9  --   PLT 207  --   --  184 150  --   APTT  --    < > 114* 78* 60*  --   LABPROT 13.8  --   --   --   --   --   INR 1.07  --   --   --   --   --   HEPARINUNFRC  --    < > 1.06* 0.70 0.30 0.30  CREATININE 1.01*  --   --   --  1.03*  --    < > = values in this interval not displayed.    Estimated Creatinine Clearance: 63.4 mL/min (A) (by C-G formula based on SCr of 1.03 mg/dL (H)).  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 Pharmacy consulted to transition to IV Heparin. Last Xarelto dose was on 02/11/19 at 0800 AM per patient as she reports she ran out of the medication 3 days ago (>72 hrs since last dose). Patient has had a prior bypass on this leg.    Pt now s/p L AKA by VVS yesterday and now resumed on IV heparin. Heparin level and aPTT now correlating, dosing off heparin level which is therapeutic at 0.30. H/H stable.   Goal of Therapy:  Heparin level 0.3-0.7 units/ml Monitor platelets by anticoagulation protocol: Yes   Plan:  -Continue heparin 1050 units/hr -Daily heparin level and CBC  Arrie Senate, PharmD, BCPS Clinical  Pharmacist [phone] Please check AMION for all [place] numbers 02/17/2019

## 2019-02-18 LAB — BASIC METABOLIC PANEL
ANION GAP: 10 (ref 5–15)
BUN: 9 mg/dL (ref 6–20)
CO2: 25 mmol/L (ref 22–32)
Calcium: 8.4 mg/dL — ABNORMAL LOW (ref 8.9–10.3)
Chloride: 99 mmol/L (ref 98–111)
Creatinine, Ser: 0.85 mg/dL (ref 0.44–1.00)
GFR calc Af Amer: >60 mL/min (ref >60)
GFR calc non Af Amer: >60 mL/min (ref >60)
Glucose, Bld: 132 mg/dL — ABNORMAL HIGH (ref 70–99)
Potassium: 3.9 mmol/L (ref 3.5–5.1)
Sodium: 134 mmol/L — ABNORMAL LOW (ref 135–145)

## 2019-02-18 LAB — HEPARIN LEVEL (UNFRACTIONATED): Heparin Unfractionated: 0.3 IU/mL (ref 0.30–0.70)

## 2019-02-18 LAB — CBC
HCT: 33.8 % — ABNORMAL LOW (ref 36.0–46.0)
Hemoglobin: 10.7 g/dL — ABNORMAL LOW (ref 12.0–15.0)
MCH: 30.8 pg (ref 26.0–34.0)
MCHC: 31.7 g/dL (ref 30.0–36.0)
MCV: 97.4 fL (ref 80.0–100.0)
Platelets: 132 10*3/uL — ABNORMAL LOW (ref 150–400)
RBC: 3.47 MIL/uL — ABNORMAL LOW (ref 3.87–5.11)
RDW: 11.9 % (ref 11.5–15.5)
WBC: 11.8 10*3/uL — ABNORMAL HIGH (ref 4.0–10.5)
nRBC: 0 % (ref 0.0–0.2)

## 2019-02-18 NOTE — Progress Notes (Signed)
ANTICOAGULATION CONSULT NOTE - Follow Up Consult  Pharmacy Consult for Heparin Indication: left leg ischemia  Allergies  Allergen Reactions  . Propoxyphene N-Acetaminophen Anaphylaxis and Swelling  . Strawberry Extract Anaphylaxis and Shortness Of Breath    "I go into shock"  . Buspar [Buspirone] Swelling    "Swells my face"  . Grapefruit Extract Other (See Comments)    Interaction with medications she's taking    Patient Measurements: Height: 5\' 4"  (162.6 cm) Weight: 182 lb 1.6 oz (82.6 kg) IBW/kg (Calculated) : 54.7 Heparin Dosing Weight: 73 kg  Vital Signs: BP: 149/92 (02/20 0836) Pulse Rate: 98 (02/20 0836)  Labs: Recent Labs    02/15/19 2238  02/16/19 2694 02/17/19 0107 02/17/19 0729 02/18/19 0211  HGB  --    < > 13.0 12.2  --  10.7*  HCT  --   --  40.8 38.9  --  33.8*  PLT  --   --  184 150  --  132*  APTT 114*  --  78* 60*  --   --   HEPARINUNFRC 1.06*  --  0.70 0.30 0.30 0.30  CREATININE  --   --   --  1.03*  --  0.85   < > = values in this interval not displayed.    Estimated Creatinine Clearance: 76.9 mL/min (by C-G formula based on SCr of 0.85 mg/dL).  Assessment:  58 year old female on Xarelto prior to admission for history of multiple DVTs now in ED with left leg ischemia. Pharmacy consulted to transition to IV Heparin. Last Xarelto dose was on 02/11/19 at 0800 AM per patient as she reports she ran out of the medication (>72 hrs since last dose). Patient has had a prior bypass on this leg.     Pt now s/p L AKA by VVS on 02/16/19 and IV heparin resumed 2/18 pm. Heparin level and aPTT were correlating, so monitoring with heparin level only, which remains low therapeutic (0.30) on 1050 units/hr.   VVS notes okay for discharge.      Goal of Therapy:  Heparin level 0.3-0.7 units/ml Monitor platelets by anticoagulation protocol: Yes   Plan:   Continue heparin drip at 1050 units/hr.  Daily heparin level and CBC while on heparin.  Follow up for transition  back to Xarelto.  Arty Baumgartner, [place] Pager: [phone] or phone: [phone] 02/18/2019,12:17 PM

## 2019-02-18 NOTE — Progress Notes (Addendum)
  Progress Note    02/18/2019 7:24 AM 2 Days Post-Op  Subjective:  Says her bandage is too tight  Afebrile  Vitals:   02/17/19 1710 02/17/19 1944  BP: 106/80 101/78  Pulse:  95  Resp: 18 15  Temp:  98.3 F (36.8 C)  SpO2:  92%    Physical Exam: Incisions:  Clean and dry with staples in tact   CBC    Component Value Date/Time   WBC 11.8 (H) 02/18/2019 0211   RBC 3.47 (L) 02/18/2019 0211   HGB 10.7 (L) 02/18/2019 0211   HCT 33.8 (L) 02/18/2019 0211   PLT 132 (L) 02/18/2019 0211   MCV 97.4 02/18/2019 0211   MCH 30.8 02/18/2019 0211   MCHC 31.7 02/18/2019 0211   RDW 11.9 02/18/2019 0211   LYMPHSABS 5.6 (H) 02/15/2018 0636   MONOABS 0.6 02/15/2018 0636   EOSABS 0.3 02/15/2018 0636   BASOSABS 0.1 02/15/2018 0636    BMET    Component Value Date/Time   NA 134 (L) 02/18/2019 0211   K 3.9 02/18/2019 0211   CL 99 02/18/2019 0211   CO2 25 02/18/2019 0211   GLUCOSE 132 (H) 02/18/2019 0211   BUN 9 02/18/2019 0211   CREATININE 0.85 02/18/2019 0211   CREATININE 0.83 07/16/2013 1105   CALCIUM 8.4 (L) 02/18/2019 0211   GFRNONAA >60 02/18/2019 0211   GFRNONAA >60 07/29/2011 1220   GFRAA >60 02/18/2019 0211   GFRAA >60 07/29/2011 1220    INR    Component Value Date/Time   INR 1.07 02/14/2019 1655     Intake/Output Summary (Last 24 hours) at 02/18/2019 0724 Last data filed at 02/18/2019 0659 Gross per 24 hour  Intake 1244.99 ml  Output 2100 ml  Net -855.01 ml     Assessment/Plan:  58 y.o. female is s/p left above knee amputation  2 Days Post-Op  -incision looks good -will order stump sock -okay for discharge from vascular standpoint -f/u with Dr. Oneida Alar in 4 weeks.   Alisha Locket, PA-C [facility] [phone] 02/18/2019 7:24 AM  Hopefully rehab or SNF today  Alisha Hinds, MD [facility] [place] Office: [phone] Pager: [phone]

## 2019-02-18 NOTE — NC FL2 (Signed)
[place] LEVEL OF CARE SCREENING TOOL     IDENTIFICATION  Patient Name: Alisha [name] Birthdate: 08-Jun-1961 Sex: female Admission Date (Current Location): 02/14/2019  [facility] and [place] Number:  Herbalist and Address:  [facility] [place] [address] [place], [place] [address]      Provider Number: [id number]  Attending Physician Name and Address:  Elam Dutch, MD  Relative Name and Phone Number:  Charrise Lardner [phone]    Current Level of Care: Hospital Recommended Level of Care: [place] Prior Approval Number:    Date Approved/Denied:   PASRR Number: pending   Discharge Plan: SNF    Current Diagnoses: Patient Active Problem List   Diagnosis Date Noted  . Gangrene of left foot ([place]) 02/14/2019  . Loss of consciousness ([place]) 02/15/2018  . Asthma 10/07/2016  . Colonic diverticular abscess   . Anxiety 08/28/2014  . Breast pain, left 08/11/2014  . Abnormal ECG 01/28/2014  . Health care maintenance 09/16/2013  . Metrorrhagia 10/12/2012  . Endometrial mass 10/12/2012  . 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 Osteoarthritis of both knees 02/15/2012  . Chronic pain syndrome 07/05/2010  . Venous thromboembolism 02/22/2010  . Depression 07/17/2009  . 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 TOBACCO ABUSE 10/20/2006  . Essential hypertension 10/20/2006  . Peripheral vascular disease ([place]) 10/20/2006  . HYPERLIPIDEMIA, MIXED 10/10/2006    Orientation RESPIRATION BLADDER Height & Weight     Self, Time, Situation  O2(nasal cannula 2(L/min)) External catheter Weight: 182 lb 1.6 oz (82.6 kg) Height:  5\' 4"  (162.6 cm)  BEHAVIORAL SYMPTOMS/MOOD NEUROLOGICAL BOWEL NUTRITION STATUS      Continent Diet(please see discharge summary )  AMBULATORY STATUS COMMUNICATION OF NEEDS Skin     Verbally Surgical wounds(incision(closed) thigh, left incision(closed) breast, left )                        Personal Care Assistance Level of Assistance  Bathing, Feeding, Dressing Bathing Assistance: Limited assistance Feeding assistance: Independent Dressing Assistance: Limited assistance     Functional Limitations Info  Sight, Hearing, Speech Sight Info: Adequate Hearing Info: Adequate Speech Info: Adequate    SPECIAL CARE FACTORS FREQUENCY  PT (By licensed PT), OT (By licensed OT)     PT Frequency: 3x per week  OT Frequency: 3x per week             Contractures Contractures Info: Not present    Additional Factors Info  Code Status, Allergies, Psychotropic Code Status Info: FULL Allergies Info: N-acetaminophen, Propoxyphene , Strawberry Extract, Buspar buspirone, Grapefruit Extract Psychotropic Info: diazepam (VALIUM) tablet 5 mg 3x daily, escitalopram (LEXAPRO) tablet 10 mg daily, DULoxetine (CYMBALTA) DR capsule 60 mg 2x daily         Current Medications (02/18/2019):  This is the current hospital active medication list Current Facility-Administered Medications  Medication Dose Route Frequency Provider Last Rate Last Dose  . 0.9 %  sodium chloride infusion  250 mL Intravenous PRN Angelia Mould, MD      . acetaminophen (TYLENOL) tablet 325-650 mg  325-650 mg Oral Q4H PRN Angelia Mould, MD       Or  . acetaminophen (TYLENOL) suppository 325-650 mg  325-650 mg Rectal Q4H PRN Angelia Mould, MD      . acetaminophen (TYLENOL) tablet 325-650 mg  325-650 mg Oral Q4H PRN Angelia Mould, MD  Or  . acetaminophen (TYLENOL) suppository 325-650 mg  325-650 mg Rectal Q4H PRN Angelia Mould, MD      . albuterol (PROVENTIL) (2.5 MG/3ML) 0.083% nebulizer solution 2.5 mg  2.5 mg Inhalation Q6H PRN Angelia Mould, MD      . alum & mag hydroxide-simeth (MAALOX/MYLANTA) 200-200-20 MG/5ML suspension 15-30 mL  15-30 mL Oral Q2H PRN Angelia Mould, MD      . aspirin EC tablet 81 mg  81 mg Oral Daily Angelia Mould,  MD   81 mg at 02/18/19 6195  . atorvastatin (LIPITOR) tablet 40 mg  40 mg Oral Daily Angelia Mould, MD   40 mg at 02/18/19 0932  . carvedilol (COREG) tablet 12.5 mg  12.5 mg Oral BID WC Angelia Mould, MD   12.5 mg at 02/18/19 0836  . dextrose 5 %-0.45 % sodium chloride infusion   Intravenous Continuous Angelia Mould, MD 75 mL/hr at 02/18/19 0845    . diazepam (VALIUM) tablet 5 mg  5 mg Oral TID Angelia Mould, MD   5 mg at 02/18/19 6712  . docusate sodium (COLACE) capsule 100 mg  100 mg Oral Daily Angelia Mould, MD   100 mg at 02/18/19 4580  . DULoxetine (CYMBALTA) DR capsule 60 mg  60 mg Oral BID Angelia Mould, MD   60 mg at 02/18/19 9983  . escitalopram (LEXAPRO) tablet 10 mg  10 mg Oral Daily Angelia Mould, MD   10 mg at 02/18/19 3825  . gabapentin (NEURONTIN) capsule 800 mg  800 mg Oral QID Angelia Mould, MD   800 mg at 02/18/19 0539  . guaiFENesin-dextromethorphan (ROBITUSSIN DM) 100-10 MG/5ML syrup 15 mL  15 mL Oral Q4H PRN Angelia Mould, MD      . heparin ADULT infusion 100 units/mL (25000 units/232mL sodium chloride 0.45%)  1,050 Units/hr Intravenous Continuous Einar Grad, RPH 10.5 mL/hr at 02/17/19 1600 1,050 Units/hr at 02/17/19 1600  . hydrALAZINE (APRESOLINE) injection 5 mg  5 mg Intravenous Q20 Min PRN Angelia Mould, MD      . hydrochlorothiazide (HYDRODIURIL) tablet 25 mg  25 mg Oral Daily Angelia Mould, MD   25 mg at 02/18/19 7673  . hydrOXYzine (ATARAX/VISTARIL) tablet 50 mg  50 mg Oral TID PRN Angelia Mould, MD      . labetalol (NORMODYNE,TRANDATE) injection 10 mg  10 mg Intravenous Q10 min PRN Angelia Mould, MD      . lactated ringers infusion   Intravenous Continuous Angelia Mould, MD 10 mL/hr at 02/16/19 [phone]    . losartan (COZAAR) tablet 100 mg  100 mg Oral Daily Angelia Mould, MD   100 mg at 02/18/19 7902  . lurasidone (LATUDA) tablet 80  mg  80 mg Oral BID Angelia Mould, MD   80 mg at 02/18/19 4097  . magnesium sulfate IVPB 2 g 50 mL  2 g Intravenous Once PRN Angelia Mould, MD      . metoprolol tartrate (LOPRESSOR) injection 2-5 mg  2-5 mg Intravenous Q2H PRN Angelia Mould, MD      . morphine 2 MG/ML injection 2-5 mg  2-5 mg Intravenous Q1H PRN Angelia Mould, MD   2 mg at 02/18/19 0830  . ondansetron (ZOFRAN) injection 4 mg  4 mg Intravenous Q6H PRN Angelia Mould, MD      . oxyCODONE-acetaminophen (PERCOCET/ROXICET) 5-325 MG per tablet 1-2 tablet  1-2 tablet Oral  Q4H PRN Angelia Mould, MD   2 tablet at 02/18/19 [phone]  . pantoprazole (PROTONIX) EC tablet 40 mg  40 mg Oral Daily Angelia Mould, MD   40 mg at 02/18/19 0051  . phenol (CHLORASEPTIC) mouth spray 1 spray  1 spray Mouth/Throat PRN Angelia Mould, MD      . potassium chloride (K-DUR,KLOR-CON) CR tablet 10 mEq  10 mEq Oral Daily Angelia Mould, MD   10 mEq at 02/18/19 0836  . potassium chloride SA (K-DUR,KLOR-CON) CR tablet 20-40 mEq  20-40 mEq Oral Once PRN Angelia Mould, MD      . sodium chloride 0.9 % bolus 1,000 mL  1,000 mL Intravenous Once Angelia Mould, MD 983.6 mL/hr at 02/15/19 2159    . sodium chloride flush (NS) 0.9 % injection 3 mL  3 mL Intravenous Q12H Angelia Mould, MD   3 mL at 02/18/19 0837  . sodium chloride flush (NS) 0.9 % injection 3 mL  3 mL Intravenous PRN Angelia Mould, MD      . vitamin E capsule 800 Units  800 Units Oral BID Angelia Mould, MD   800 Units at 02/18/19 1021     Discharge Medications: Please see discharge summary for a list of discharge medications.  Relevant Imaging Results:  Relevant Lab Results:   Additional Information SSN [id number]  Vinie Sill, [place]

## 2019-02-18 NOTE — Progress Notes (Signed)
Physical Therapy Treatment Patient Details Name: Alisha [name] MRN: [id number] DOB: [birth year] Today's Date: 02/18/2019    History of Present Illness 58 y.o. female is s/p left above knee amputation. PMH includes: PAD, bipolar, DVT (lifelong xarelto, ran out a few days ago), hypertension, hyperlipidemia which are stable.      PT Comments    Patient progressing slowly due to pain.  She is eager and participates with increased time due to pain and slower processing.  Feel she will need SNF level rehab as home is not w/c accessible.  PT to follow acutely.  Follow Up Recommendations  SNF;Supervision/Assistance - 24 hour     Equipment Recommendations  Other (comment)(TBA)    Recommendations for Other Services       Precautions / Restrictions Precautions Precautions: Fall Precaution Comments: L AKA Restrictions Weight Bearing Restrictions: Yes Other Position/Activity Restrictions: NWB LLE    Mobility  Bed Mobility Overal bed mobility: Needs Assistance Bed Mobility: Rolling Rolling: Max assist;+2 for physical assistance   Supine to sit: Max assist;HOB elevated;+2 for physical assistance     General bed mobility comments: assist to roll slightly to take out soiled pad using rail; assisted to scoot hips with pad under pt and using R leg to assist; then assist to lift trunk and square hips to EOB  Transfers Overall transfer level: Needs assistance   Transfers: Lateral/Scoot Transfers          Lateral/Scoot Transfers: Max assist;+2 physical assistance;From elevated surface General transfer comment: increased time and cues/assist for forward weight shift for balance, pt in pain and with difficulty scooting, attempted to pivot on R foot, but pt unable to clear hips; then she assisted some with +2 to scoot to chair from bed  Ambulation/Gait                 Stairs             Wheelchair Mobility    Modified Rankin (Stroke Patients Only)        Balance Overall balance assessment: Needs assistance Sitting-balance support: Bilateral upper extremity supported;Feet supported Sitting balance-Leahy Scale: Poor Sitting balance - Comments: on EOB leaning back, but able to support with her UE's briefly before fatigue, with scooting, slight improvement in active trunk control Postural control: Posterior lean                                  Cognition Arousal/Alertness: Awake/alert Behavior During Therapy: WFL for tasks assessed/performed Overall Cognitive Status: Within Functional Limits for tasks assessed                                        Exercises General Exercises - Lower Extremity Hip Flexion/Marching: AROM;Left;Supine;Other (comment)(performed x 2 for donning ace wrap on leg) Other Exercises Other Exercises: pt attempted hip extension on R with HOB still slightly elevated, but c/o pain Other Exercises: R single leg bridges with glut squeeze x 5    General Comments General comments (skin integrity, edema, etc.): Daughter present. Pt with poor insight and talking out of her head throughout session and wanting pain meds. RN made aware.      Pertinent Vitals/Pain Pain Assessment: Faces Faces Pain Scale: Hurts whole lot Pain Location: LLE with movement Pain Descriptors / Indicators: Tightness;Sore;Operative site guarding;Grimacing;Aching Pain Intervention(s): Monitored during session;Repositioned;Premedicated before session  Home Living Family/patient expects to be discharged to:: Skilled nursing facility Living Arrangements: Alone                  Prior Function            PT Goals (current goals can now be found in the care plan section) Acute Rehab PT Goals Patient Stated Goal: regain independence Progress towards PT goals: Progressing toward goals    Frequency    Min 3X/week      PT Plan Discharge plan needs to be updated    Co-evaluation               AM-PAC PT "6 Clicks" Mobility   Outcome Measure  Help needed turning from your back to your side while in a flat bed without using bedrails?: Total Help needed moving from lying on your back to sitting on the side of a flat bed without using bedrails?: Total Help needed moving to and from a bed to a chair (including a wheelchair)?: Total Help needed standing up from a chair using your arms (e.g., wheelchair or bedside chair)?: Total Help needed to walk in hospital room?: Total Help needed climbing 3-5 steps with a railing? : Total 6 Click Score: 6    End of Session Equipment Utilized During Treatment: Gait belt Activity Tolerance: Patient limited by pain Patient left: in chair;with call bell/phone within reach Nurse Communication: Mobility status;Need for lift equipment PT Visit Diagnosis: Other abnormalities of gait and mobility (R26.89);Pain;Muscle weakness (generalized) (M62.81) Pain - Right/Left: Left Pain - part of body: Leg     Time: 1000-1050 PT Time Calculation (min) (ACUTE ONLY): 50 min  Charges:  $Therapeutic Exercise: 8-22 mins $Therapeutic Activity: 23-37 mins                     Magda Kiel, [place] Acute Rehabilitation Services [phone] 02/18/2019    Reginia Naas 02/18/2019, 1:47 PM

## 2019-02-18 NOTE — Clinical Social Work Note (Addendum)
Clinical Social Work Assessment  Patient Details  Name: Alisha [name] MRN: [id number] Date of Birth: 01-24-1961  Date of referral:  02/17/19               Reason for consult:  Facility Placement                Permission sought to share information with:  Facility Sport and exercise psychologist, Family Supports Permission granted to share information::  Yes, Verbal Permission Granted  Name::     Alisha [name]   Agency::  [facility]   Relationship::  daughter  Contact Information:  [phone]  Housing/Transportation Living arrangements for the past 2 months:  Single Family Home Source of Information:  Adult Children Patient Interpreter Needed:  None Criminal Activity/Legal Involvement Pertinent to Current Situation/Hospitalization:  No - Comment as needed Significant Relationships:  None Lives with:  Self Do you feel safe going back to the place where you live?  No Need for family participation in patient care:  Yes (Comment)  Care giving concerns:  CSW received consult for discharge needs. CSW spoke with patient's daughter, Alisha [name] regarding placement at [facility] for rehab. Patient's daughter requested SNF placement verses inpatient rehab. Patient's family expressed wanting the patient to get better.    Social Worker assessment / plan:  CSW spoke with patient's daughter concerning possibility of rehab at [facility] before returning home. Patient's daughter expressed concerns about her mother drug abuse of cocaine. She states her mother had a massive heart attack years ago and during that time she became addicted to the pain medications. Patient's daughter states substance abuse (crack cocaine) on and off for over 10 years.  She is involved with the [facility] (ACT) team.  The ACT  Team had assisted the patient with securing her current apartment. However,  Patient's daughter states she was informed by the patient she may be padlocked out of her home for not paying the rent  but she has not confirmed this information. CSW advised to call apartment manager and confirm and contact ACT Team to help assist with her concerns.    Employment status:  Disabled (Comment on whether or not currently receiving Disability) Insurance information:  Medicare, Medicaid [place] PT Recommendations:  Inpatient Rehab Consult Information / Referral to community resources:  [place]  Patient/Family's Response to care: Patient's dauhter recognizes need for rehab before returning home and is agreeable to a SNF placement. Family reported preference for [facility].   Patient/Family's Understanding of and Emotional Response to Diagnosis, Current Treatment, and Prognosis:  Patient's family  is realistic regarding therapy needs and expressed being hopeful for SNF placement. Family expressed understanding of CSW role and discharge process as well as medical condition. No questions/concerns about plan or treatment at this time.   Emotional Assessment Appearance:    Attitude/Demeanor/Rapport:  Unable to Assess Affect (typically observed):  Unable to Assess Orientation:  Oriented to Self, Oriented to Place, Oriented to  Time, Oriented to Situation Alcohol / Substance use:  Illicit Drugs Psych involvement (Current and /or in the community):  No (Comment)  Discharge Needs  Concerns to be addressed:  Care Coordination, Basic Needs, Substance Abuse Concerns Readmission within the last 30 days:  No Current discharge risk:  Dependent with Mobility, Lives alone, Substance Abuse Barriers to Discharge:  Continued Medical Work up   [employer], [place] 02/18/2019, 1:19 PM

## 2019-02-18 NOTE — Progress Notes (Addendum)
Occupational Therapy Treatment Patient Details Name: Alisha [name] MRN: [id number] DOB: May 04, 1961 Today's Date: 02/18/2019    History of present illness 58 y.o. female is s/p left above knee amputation. PMH includes: PAD, bipolar, DVT (lifelong xarelto, ran out a few days ago), hypertension, hyperlipidemia which are stable.     OT comments  Pt seen for progress session. Pt performing scooting to HOB supine in bed with MaxA; rolling side to side with MaxA and facial grimacing throughout movements. Pt performing UB ADL with minA today as pt lethargic and requiring assist to stay awake for feeding. Pt would benefit from continued OT skilled services for ADL, safe mobility and safety in SNF setting. OT to follow acutely.   Follow Up Recommendations  SNF 24/7 care    Equipment Recommendations       Recommendations for Other Services Rehab consult    Precautions / Restrictions Precautions Precautions: Fall Precaution Comments: L AKA Restrictions Weight Bearing Restrictions: Yes Other Position/Activity Restrictions: NWB LLE       Mobility Bed Mobility Overal bed mobility: Needs Assistance Bed Mobility: Rolling Rolling: Max assist         General bed mobility comments: cues for rail use and mobility in RLE  Transfers Overall transfer level: Needs assistance               General transfer comment: Pt's daughter reported to transfer the pt. she was advised not to transfer without staff in room due to complications and to promote pt independence for lateral scoot. Pt made aware to use call bell for assist.    Balance Overall balance assessment: Needs assistance Sitting-balance support: Bilateral upper extremity supported;Feet supported                                       ADL either performed or assessed with clinical judgement   ADL Overall ADL's : Needs assistance/impaired                                     Functional  mobility during ADLs: Maximal assistance;+2 for physical assistance;Cueing for safety;Cueing for sequencing General ADL Comments: Pt lying in bed with head elevated to eat a few bites with minA for fork placement to food.      Vision   Vision Assessment?: No apparent visual deficits   Perception     Praxis      Cognition Arousal/Alertness: Awake/alert Behavior During Therapy: WFL for tasks assessed/performed Overall Cognitive Status: Within Functional Limits for tasks assessed                                          Exercises     Shoulder Instructions       General Comments Daughter present. Pt with poor insight and talking out of her head throughout session and wanting pain meds. RN made aware.    Pertinent Vitals/ Pain       Pain Assessment: Faces Faces Pain Scale: Hurts whole lot Pain Location: LLE with movement Pain Descriptors / Indicators: Tightness;Sore;Operative site guarding;Grimacing;Aching Pain Intervention(s): Monitored during session;Repositioned;Premedicated before session  Alisha [name] expects to be discharged to:: Skilled nursing facility Living Arrangements: Alone  Prior Functioning/Environment              Frequency  Min 3X/week        Progress Toward Goals  OT Goals(current goals can now be found in the care plan section)  Progress towards OT goals: Progressing toward goals  Acute Rehab OT Goals Patient Stated Goal: regain independence OT Goal Formulation: With patient/family Time For Goal Achievement: 03/03/19 Potential to Achieve Goals: Good ADL Goals Pt Will Perform Grooming: with modified independence;sitting Pt Will Perform Upper Body Bathing: with modified independence;sitting Pt Will Perform Lower Body Bathing: sitting/lateral leans;with min assist Pt Will Perform Upper Body Dressing: with set-up;sitting;with supervision Pt Will Perform Lower  Body Dressing: with min assist;sitting/lateral leans Pt Will Transfer to Toilet: with min assist;with transfer board;bedside commode Pt Will Perform Toileting - Clothing Manipulation and hygiene: with min assist;sitting/lateral leans Additional ADL Goal #1: Pt will complete bed mobility at min A level to prepare for OOB ADLs.  Plan Discharge plan remains appropriate    Co-evaluation                 AM-PAC OT "6 Clicks" Daily Activity     Outcome Measure   Help from another person eating meals?: None Help from another person taking care of personal grooming?: A Little Help from another person toileting, which includes using toliet, bedpan, or urinal?: Total Help from another person bathing (including washing, rinsing, drying)?: A Lot Help from another person to put on and taking off regular upper body clothing?: A Lot Help from another person to put on and taking off regular lower body clothing?: A Little 6 Click Score: 15    End of Session    OT Visit Diagnosis: Other abnormalities of gait and mobility (R26.89);Muscle weakness (generalized) (M62.81) Pain - Right/Left: Left Pain - part of body: Leg   Activity Tolerance Patient limited by pain;Patient limited by fatigue   Patient Left in bed;with bed alarm set;with call bell/phone within reach   Nurse Communication Patient requests pain meds;Other (comment)(pill found in bed- RN made aware)        Time: 5701-7793 OT Time Calculation (min): 23 min  Charges: OT General Charges $OT Visit: 1 Visit OT Treatments $Therapeutic Activity: 8-22 mins $Neuromuscular Re-education: 8-22 mins  Darryl Nestle) Marsa Aris OTR/L Acute Rehabilitation Services Pager: [phone] Office: [phone]    Fredda Hammed 02/18/2019, 1:39 PM

## 2019-02-19 DIAGNOSIS — E782 Mixed hyperlipidemia: Secondary | ICD-10-CM | POA: Diagnosis not present

## 2019-02-19 DIAGNOSIS — F3170 Bipolar disorder, currently in remission, most recent episode unspecified: Secondary | ICD-10-CM | POA: Diagnosis not present

## 2019-02-19 DIAGNOSIS — M6281 Muscle weakness (generalized): Secondary | ICD-10-CM | POA: Diagnosis not present

## 2019-02-19 DIAGNOSIS — I998 Other disorder of circulatory system: Secondary | ICD-10-CM | POA: Diagnosis not present

## 2019-02-19 DIAGNOSIS — L97129 Non-pressure chronic ulcer of left thigh with unspecified severity: Secondary | ICD-10-CM | POA: Diagnosis not present

## 2019-02-19 DIAGNOSIS — F319 Bipolar disorder, unspecified: Secondary | ICD-10-CM | POA: Diagnosis not present

## 2019-02-19 DIAGNOSIS — Z720 Tobacco use: Secondary | ICD-10-CM | POA: Diagnosis not present

## 2019-02-19 DIAGNOSIS — I739 Peripheral vascular disease, unspecified: Secondary | ICD-10-CM | POA: Diagnosis not present

## 2019-02-19 DIAGNOSIS — F331 Major depressive disorder, recurrent, moderate: Secondary | ICD-10-CM | POA: Diagnosis not present

## 2019-02-19 DIAGNOSIS — Z4781 Encounter for orthopedic aftercare following surgical amputation: Secondary | ICD-10-CM | POA: Diagnosis not present

## 2019-02-19 DIAGNOSIS — Z7401 Bed confinement status: Secondary | ICD-10-CM | POA: Diagnosis not present

## 2019-02-19 DIAGNOSIS — R262 Difficulty in walking, not elsewhere classified: Secondary | ICD-10-CM | POA: Diagnosis not present

## 2019-02-19 DIAGNOSIS — Z9119 Patient's noncompliance with other medical treatment and regimen: Secondary | ICD-10-CM | POA: Diagnosis not present

## 2019-02-19 DIAGNOSIS — M79605 Pain in left leg: Secondary | ICD-10-CM | POA: Diagnosis not present

## 2019-02-19 DIAGNOSIS — F329 Major depressive disorder, single episode, unspecified: Secondary | ICD-10-CM | POA: Diagnosis not present

## 2019-02-19 DIAGNOSIS — F411 Generalized anxiety disorder: Secondary | ICD-10-CM | POA: Diagnosis not present

## 2019-02-19 DIAGNOSIS — F064 Anxiety disorder due to known physiological condition: Secondary | ICD-10-CM | POA: Diagnosis not present

## 2019-02-19 DIAGNOSIS — Z89612 Acquired absence of left leg above knee: Secondary | ICD-10-CM | POA: Diagnosis not present

## 2019-02-19 DIAGNOSIS — Z4789 Encounter for other orthopedic aftercare: Secondary | ICD-10-CM | POA: Diagnosis not present

## 2019-02-19 DIAGNOSIS — J45909 Unspecified asthma, uncomplicated: Secondary | ICD-10-CM | POA: Diagnosis not present

## 2019-02-19 DIAGNOSIS — I96 Gangrene, not elsewhere classified: Secondary | ICD-10-CM | POA: Diagnosis not present

## 2019-02-19 DIAGNOSIS — I10 Essential (primary) hypertension: Secondary | ICD-10-CM | POA: Diagnosis not present

## 2019-02-19 DIAGNOSIS — F419 Anxiety disorder, unspecified: Secondary | ICD-10-CM | POA: Diagnosis not present

## 2019-02-19 DIAGNOSIS — I8290 Acute embolism and thrombosis of unspecified vein: Secondary | ICD-10-CM | POA: Diagnosis not present

## 2019-02-19 DIAGNOSIS — R5381 Other malaise: Secondary | ICD-10-CM | POA: Diagnosis not present

## 2019-02-19 DIAGNOSIS — R531 Weakness: Secondary | ICD-10-CM | POA: Diagnosis not present

## 2019-02-19 DIAGNOSIS — M2550 Pain in unspecified joint: Secondary | ICD-10-CM | POA: Diagnosis not present

## 2019-02-19 LAB — CBC
HCT: 32 % — ABNORMAL LOW (ref 36.0–46.0)
Hemoglobin: 10.5 g/dL — ABNORMAL LOW (ref 12.0–15.0)
MCH: 31.4 pg (ref 26.0–34.0)
MCHC: 32.8 g/dL (ref 30.0–36.0)
MCV: 95.8 fL (ref 80.0–100.0)
Platelets: 164 10*3/uL (ref 150–400)
RBC: 3.34 MIL/uL — ABNORMAL LOW (ref 3.87–5.11)
RDW: 12 % (ref 11.5–15.5)
WBC: 11.6 10*3/uL — ABNORMAL HIGH (ref 4.0–10.5)
nRBC: 0 % (ref 0.0–0.2)

## 2019-02-19 LAB — HEPARIN LEVEL (UNFRACTIONATED)
Heparin Unfractionated: 0.22 IU/mL — ABNORMAL LOW (ref 0.30–0.70)
Heparin Unfractionated: 0.28 IU/mL — ABNORMAL LOW (ref 0.30–0.70)

## 2019-02-19 MED ORDER — OXYCODONE-ACETAMINOPHEN 5-325 MG PO TABS: 1.0000 | ORAL_TABLET | ORAL | 0 refills | Status: DC | PRN

## 2019-02-19 MED ORDER — RIVAROXABAN 10 MG PO TABS
10.0000 mg | ORAL_TABLET | Freq: Every day | ORAL | Status: DC
  Administered 2019-02-19: 10 mg via ORAL
  Filled 2019-02-19: qty 1

## 2019-02-19 NOTE — Discharge Summary (Signed)
[facility] Discharge Summary   Patient ID:  Alisha [name] MRN: [id number] DOB/AGE: 1961-03-23 58 y.o.  Admit date: 02/14/2019 Discharge date: 02/19/2019 Date of Surgery: 02/16/2019 Surgeon: Surgeon(s): Angelia Mould, MD  Admission Diagnosis: Preop cardiovascular exam [Z01.810] Ischemia of foot [I99.8]  Discharge Diagnoses:  Preop cardiovascular exam [W09.811] Ischemia of foot [I99.8]  Secondary Diagnoses: Past Medical History:  Diagnosis Date  . Anxiety   . Asthma   . Bipolar disorder ([place])   . Breast lump 02/2008   Biopsy 05/2008: showed no evidence of malignancy  . Breast mass in female    bi lat   . CHF (congestive heart failure) ([place])   . Chronic pain syndrome   . Degenerative joint disease of knee   . Dental caries   . Depression   . 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 Endometrial mass 10/12/2012   Endometrium, biopsy on 11/04/12 - PROLIFERATIVE ENDOMETRIUM AND ABUNDANT MUCUS. NO HYPERPLASIA OR CARCINOMA.   . Fibromyalgia   . Hyperlipidemia   . Hypertension   . Irregular menses 9/08  . Joint pain   . Mixed stress and urge urinary incontinence    Being followed by [facility], underwent cystoscopy & uroflowmetry   . Pulmonary edema   . PVD (peripheral vascular disease) ([place])    s/p L fem-pop bypass 11/21/08; graft occluded 12/28/08;  aortogram w/ bilat LE runoff: 1.  bilat diffuse SFA occlusive dz, 2.  Mod to severe above-knee popliteal dz,  3.  Bilat 3-vessel runoff w/ mild tibial occlusive dz.  . Restless leg syndrome   . Sigmoid diverticulitis 10/26/2012  . Tobacco abuse   . Wears glasses     Procedure(s): AMPUTATION ABOVE KNEE  Discharged Condition: stable  HPI: Alisha [name] is a 58 y.o. female with known PAD.  She has a 3 day history of dark discoloration of her toes in her left foot.  Her foot has been hurting for 5 days.  She had prior left femoral  endarterectomy and left fem pop and right external iliac artery stent.  Her fem pop was in 2009.  It occluded after one month.  She was last seen in by [name] in 2014.  At that time her ABI was 0.7 bilaterally with known bilateral SFA occlusions. She currently smokes about 1/2 ppd.  She is ambulatory but has a home health aid that assists with cooking cleaning and medications.  Other medical problems include bipolar, DVT (lifelong xarelto, ran out a few days ago), hypertension, hyperlipidemia which are stable.  She is on aspirin and a statin.  CTA reviewed by Dr. Oneida Alar.  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  Right external iliac stent patent right side right SFA occluded reconstitutes BK pop 3 vessel runoff but small  Will discuss findings with pt in morning and consider possible aortobifem.  Most likely she will need BkA or Aka right side regardless  Hospital Course:  Alisha [name] is a 58 y.o. female is S/P Left Procedure(s): AMPUTATION ABOVE KNEE Xarelto was stopped for surgery and now may be restarted. Stump closed with staples, stump appears viable.  Dry dressing as needed she will f/u with Dr. Oneida Alar in 4 weeks for wound check and staple removal.  Consults:  Treatment Team:  Angelia Mould, MD  Significant Diagnostic Studies: CBC Lab Results  Component Value Date   WBC 11.6 (H) 02/19/2019   HGB 10.5 (L) 02/19/2019   HCT 32.0 (L) 02/19/2019   MCV 95.8 02/19/2019   PLT 164 02/19/2019    BMET    Component Value Date/Time   NA 134 (L) 02/18/2019 0211   K 3.9 02/18/2019 0211   CL 99 02/18/2019 0211   CO2 25 02/18/2019 0211   GLUCOSE 132 (H) 02/18/2019 0211   BUN 9 02/18/2019 0211   CREATININE 0.85 02/18/2019 0211   CREATININE 0.83 07/16/2013 1105   CALCIUM 8.4 (L) 02/18/2019 0211   GFRNONAA >60 02/18/2019 0211   GFRNONAA >60 07/29/2011 1220   GFRAA  >60 02/18/2019 0211   GFRAA >60 07/29/2011 1220   COAG Lab Results  Component Value Date   INR 1.07 02/14/2019   INR 0.95 02/15/2018   INR 1.05 11/04/2017     Disposition:  Discharge to :Skilled nursing facility Discharge Instructions    Call MD for:  redness, tenderness, or signs of infection (pain, swelling, bleeding, redness, odor or green/yellow discharge around incision site)   Complete by:  As directed    Call MD for:  severe or increased pain, loss or decreased feeling  in affected limb(s)   Complete by:  As directed    Call MD for:  temperature >100.5   Complete by:  As directed    Resume previous diet   Complete by:  As directed      Allergies as of 02/19/2019      Reactions   Propoxyphene N-acetaminophen Anaphylaxis, Swelling   Strawberry Extract Anaphylaxis, Shortness Of Breath   "I go into shock"   Buspar [buspirone] Swelling   "Swells my face"   Grapefruit Extract Other (See Comments)   Interaction with medications she's taking      Medication List    TAKE these medications   albuterol 108 (90 Base) MCG/ACT inhaler Commonly known as:  VENTOLIN HFA Inhale 1-2 puffs into the lungs every 6 (six) hours as needed for wheezing or shortness of breath.   aspirin EC 81 MG tablet Take 81 mg by mouth daily.   atorvastatin 40 MG tablet Commonly known as:  LIPITOR Take 1 tablet (40 mg total) by mouth at bedtime. What changed:  when to take this   carvedilol 12.5 MG tablet Commonly known as:  COREG Take 1 tablet (12.5 mg total) by mouth 2 (two) times daily with a meal.   diazepam 5 MG tablet Commonly known as:  VALIUM Take 5 mg by mouth 3 (three) times daily.   DULoxetine 60 MG capsule Commonly known as:  CYMBALTA Take 60 mg by mouth 2 (two) times daily.   escitalopram 10 MG tablet Commonly known as:  LEXAPRO Take 10 mg by mouth daily.   gabapentin 800 MG tablet Commonly known as:  NEURONTIN Take 800 mg by mouth 4 (four) times daily.    hydrochlorothiazide 25 MG tablet Commonly known as:  HYDRODIURIL Take 1 tablet (25 mg total) by mouth daily.   hydrOXYzine 50 MG tablet Commonly known as:  ATARAX/VISTARIL Take 50 mg by mouth See admin instructions. Take 50 mg by mouth at bedtime and an additional 50 mg two times a day as needed for anxiety   LATUDA 120 MG Tabs Generic drug:  Lurasidone HCl Take 80 mg by mouth 2 (two) times daily.   losartan 100 MG tablet Commonly known as:  COZAAR Take 1 tablet (100 mg total) by mouth daily.   oxyCODONE-acetaminophen 5-325 MG tablet  Commonly known as:  PERCOCET/ROXICET Take 1 tablet by mouth every 4 (four) hours as needed for moderate pain.   potassium chloride 10 MEQ tablet Commonly known as:  K-DUR Take 10 mEq by mouth daily.   vitamin E 400 UNIT capsule Generic drug:  vitamin E Take 800 Units by mouth 2 (two) times daily.   XARELTO 10 MG Tabs tablet Generic drug:  rivaroxaban Take 10 mg by mouth daily.      Verbal and written Discharge instructions given to the patient. Wound care per Discharge AVS Follow-up Information    Elam Dutch, MD In 4 weeks.   Specialties:  Vascular Surgery, Cardiology Why:  Office will call you to arrange your appt (sent) Contact information: [address] [place] [id number] [phone]           Signed: Roxy Horseman 02/19/2019, 12:54 PM

## 2019-02-19 NOTE — Progress Notes (Signed)
[facility] [place]  Subjective  - less pain   Objective (!) 167/92 (!) 101 98 F (36.7 C) (Oral) 19 94%  Intake/Output Summary (Last 24 hours) at 02/19/2019 0808 Last data filed at 02/19/2019 0516 Gross per 24 hour  Intake 3 ml  Output 1200 ml  Net -1197 ml   Left AKA healing  Assessment/Planning: S/p AKA awaiting disposition  Ruta Hinds 02/19/2019 8:08 AM --  Laboratory Lab Results: Recent Labs    02/18/19 0211 02/19/19 0301  WBC 11.8* 11.6*  HGB 10.7* 10.5*  HCT 33.8* 32.0*  PLT 132* 164   BMET Recent Labs    02/17/19 0107 02/18/19 0211  NA 135 134*  K 3.9 3.9  CL 101 99  CO2 28 25  GLUCOSE 112* 132*  BUN 11 9  CREATININE 1.03* 0.85  CALCIUM 8.7* 8.4*    COAG Lab Results  Component Value Date   INR 1.07 02/14/2019   INR 0.95 02/15/2018   INR 1.05 11/04/2017   No results found for: PTT

## 2019-02-19 NOTE — Progress Notes (Signed)
Patient will DC to: [facility] Date: 02/19/2019 Family Notified: Ermalinda Barrios, daughter  Transport By: Corey Harold  RN, patient, and facility notified of DC. Discharge Summary sent to facility. RN given number for report [phone], Room 121. Ambulance transport requested for patient.   Clinical Social Worker signing off. Thurmond Butts, [place] Social Worker [phone]

## 2019-02-19 NOTE — Clinical Social Work Placement (Signed)
   CLINICAL SOCIAL WORK PLACEMENT  NOTE  Date:  02/19/2019  Patient Details  Name: Alisha [name] MRN: [id number] Date of Birth: [birth year]  Clinical Social Work is seeking post-discharge placement for this patient at the [place] level of care (*CSW will initial, date and re-position this form in  chart as items are completed):  Yes   Patient/family provided with [place] Work Department's list of facilities offering this level of care within the geographic area requested by the patient (or if unable, by the patient's family).  Yes   Patient/family informed of their freedom to choose among providers that offer the needed level of care, that participate in Medicare, Medicaid or managed care program needed by the patient, have an available bed and are willing to accept the patient.  Yes   Patient/family informed of [facility]'s ownership interest in [facility] and [facility], as well as of the fact that they are under no obligation to receive care at these facilities.  PASRR submitted to EDS on       PASRR number received on 02/19/19     Existing PASRR number confirmed on       FL2 transmitted to all facilities in geographic area requested by pt/family on       FL2 transmitted to all facilities within larger geographic area on       Patient informed that his/her managed care company has contracts with or will negotiate with certain facilities, including the following:        Yes   Patient/family informed of bed offers received.  Patient chooses bed at [facility]     Physician recommends and patient chooses bed at      Patient to be transferred to [facility] on 02/19/19.  Patient to be transferred to facility by [facility]     Patient family notified on 02/19/19 of transfer.  Name of family member notified:  Ermalinda Barrios, daughter     PHYSICIAN       Additional Comment:     _______________________________________________ Vinie Sill, [place] 02/19/2019, 1:06 PM

## 2019-02-19 NOTE — Progress Notes (Signed)
ANTICOAGULATION CONSULT NOTE - Follow Up Consult  Pharmacy Consult for heparin Indication: h/o VTE  Labs: Recent Labs    02/16/19 0637 02/17/19 0107 02/17/19 0729 02/18/19 0211 02/19/19 0301  HGB 13.0 12.2  --  10.7* 10.5*  HCT 40.8 38.9  --  33.8* 32.0*  PLT 184 150  --  132* 164  APTT 78* 60*  --   --   --   HEPARINUNFRC 0.70 0.30 0.30 0.30 0.22*  CREATININE  --  1.03*  --  0.85  --     Assessment: 58yo female now subtherapeutic on heparin after two levels at low end of goal, likely due to Xarelto clearing; no gtt issues or signs of bleeding per RN.  Goal of Therapy:  Heparin level 0.3-0.7 units/ml   Plan:  Will increase heparin gtt by 2 units/kg/hr to 1200 units/hr and check level in 6 hours.    Wynona Neat, PharmD, BCPS  02/19/2019,4:05 AM

## 2019-02-24 ENCOUNTER — Telehealth: Payer: Self-pay | Admitting: Vascular Surgery

## 2019-02-24 ENCOUNTER — Other Ambulatory Visit: Payer: Self-pay | Admitting: *Deleted

## 2019-02-24 NOTE — Telephone Encounter (Signed)
-----   Message from Pennelope Bracken sent at 02/24/2019  9:27 AM EST -----   ----- Message ----- From: Natividad Brood Sent: 02/18/2019   7:26 AM EST To: [facility] Admin Pool, [facility] Charge Pool  S/p left AKA.  F/u with Dr. Oneida Alar in 4 weeks.   Thanks

## 2019-02-24 NOTE — Patient Outreach (Signed)
[facility]) Care Management  02/24/2019  Leeroy Bock Minnetta [place] 1961/05/16 [id number]   Onsite visit to [employer].  Met with Eugenie Birks.  She reports patient family is wanting patient to go to ALF upon discharge.  They have been taking care of her at home, patient has Medicaid in place.   Plan to monitor for any [facility] care management needs if patient does not go to [facility]. Laymond Purser, MSN, RN, [employer] , [place] [phone]) Business Cell  [phone]) Toll Free Office

## 2019-02-24 NOTE — Telephone Encounter (Signed)
sch appt [facility]tr 03/25/2019 115pm p/o MD

## 2019-02-25 DIAGNOSIS — M6281 Muscle weakness (generalized): Secondary | ICD-10-CM | POA: Diagnosis not present

## 2019-02-25 DIAGNOSIS — L97129 Non-pressure chronic ulcer of left thigh with unspecified severity: Secondary | ICD-10-CM | POA: Diagnosis not present

## 2019-02-25 DIAGNOSIS — R262 Difficulty in walking, not elsewhere classified: Secondary | ICD-10-CM | POA: Diagnosis not present

## 2019-02-25 DIAGNOSIS — M79605 Pain in left leg: Secondary | ICD-10-CM | POA: Diagnosis not present

## 2019-02-25 DIAGNOSIS — R5381 Other malaise: Secondary | ICD-10-CM | POA: Diagnosis not present

## 2019-02-26 ENCOUNTER — Encounter: Payer: Self-pay | Admitting: Internal Medicine

## 2019-03-02 DIAGNOSIS — Z4781 Encounter for orthopedic aftercare following surgical amputation: Secondary | ICD-10-CM | POA: Diagnosis not present

## 2019-03-02 DIAGNOSIS — Z89612 Acquired absence of left leg above knee: Secondary | ICD-10-CM | POA: Diagnosis not present

## 2019-03-02 DIAGNOSIS — R5381 Other malaise: Secondary | ICD-10-CM | POA: Diagnosis not present

## 2019-03-02 DIAGNOSIS — R262 Difficulty in walking, not elsewhere classified: Secondary | ICD-10-CM | POA: Diagnosis not present

## 2019-03-02 DIAGNOSIS — M79605 Pain in left leg: Secondary | ICD-10-CM | POA: Diagnosis not present

## 2019-03-02 DIAGNOSIS — F419 Anxiety disorder, unspecified: Secondary | ICD-10-CM | POA: Diagnosis not present

## 2019-03-02 DIAGNOSIS — Z9119 Patient's noncompliance with other medical treatment and regimen: Secondary | ICD-10-CM | POA: Diagnosis not present

## 2019-03-02 DIAGNOSIS — M6281 Muscle weakness (generalized): Secondary | ICD-10-CM | POA: Diagnosis not present

## 2019-03-04 ENCOUNTER — Other Ambulatory Visit: Payer: Self-pay | Admitting: *Deleted

## 2019-03-04 DIAGNOSIS — I10 Essential (primary) hypertension: Secondary | ICD-10-CM | POA: Diagnosis not present

## 2019-03-04 DIAGNOSIS — F3170 Bipolar disorder, currently in remission, most recent episode unspecified: Secondary | ICD-10-CM | POA: Diagnosis not present

## 2019-03-04 DIAGNOSIS — I739 Peripheral vascular disease, unspecified: Secondary | ICD-10-CM | POA: Diagnosis not present

## 2019-03-04 DIAGNOSIS — L97129 Non-pressure chronic ulcer of left thigh with unspecified severity: Secondary | ICD-10-CM | POA: Diagnosis not present

## 2019-03-04 DIAGNOSIS — Z720 Tobacco use: Secondary | ICD-10-CM | POA: Diagnosis not present

## 2019-03-04 NOTE — Patient Outreach (Signed)
[facility]) Care Management  03/04/2019  Leeroy Bock Crystin Lechtenberg August 09, 1961 [id number]   Onsite visit to [employer].  Per Marita [place], discharge planner, patient has been accepted at the [facility].  She does report patient left facility over weekend for an overnight so they are expecting to issue a [facility] and let her discharge out to ALF soon, they will collaborate with Marlowe Kays [facility] UM nurse on the [facility] and discharge.   Plan to sign off as not [facility] care management needs identified at this time.  Royetta Crochet. Laymond Purser, MSN, RN, [employer] , [place] [phone]) Business Cell  [phone]) Toll Free Office

## 2019-03-08 ENCOUNTER — Encounter: Payer: Self-pay | Admitting: Cardiovascular Disease

## 2019-03-08 DIAGNOSIS — Z4781 Encounter for orthopedic aftercare following surgical amputation: Secondary | ICD-10-CM | POA: Diagnosis not present

## 2019-03-08 DIAGNOSIS — F419 Anxiety disorder, unspecified: Secondary | ICD-10-CM | POA: Diagnosis not present

## 2019-03-08 DIAGNOSIS — F319 Bipolar disorder, unspecified: Secondary | ICD-10-CM | POA: Diagnosis not present

## 2019-03-08 DIAGNOSIS — Z89612 Acquired absence of left leg above knee: Secondary | ICD-10-CM | POA: Diagnosis not present

## 2019-03-08 DIAGNOSIS — I739 Peripheral vascular disease, unspecified: Secondary | ICD-10-CM | POA: Diagnosis not present

## 2019-03-09 ENCOUNTER — Telehealth: Payer: Self-pay

## 2019-03-09 NOTE — Telephone Encounter (Signed)
New message    Just an FYI. We have made several attempts to contact this patient including sending a letter to schedule or reschedule their echocardiogram. We will be removing the patient from the echo WQ.   Thank you 

## 2019-03-10 DIAGNOSIS — J309 Allergic rhinitis, unspecified: Secondary | ICD-10-CM | POA: Diagnosis not present

## 2019-03-10 DIAGNOSIS — F319 Bipolar disorder, unspecified: Secondary | ICD-10-CM | POA: Diagnosis not present

## 2019-03-10 DIAGNOSIS — E785 Hyperlipidemia, unspecified: Secondary | ICD-10-CM | POA: Diagnosis not present

## 2019-03-10 DIAGNOSIS — R262 Difficulty in walking, not elsewhere classified: Secondary | ICD-10-CM | POA: Diagnosis not present

## 2019-03-10 DIAGNOSIS — I70202 Unspecified atherosclerosis of native arteries of extremities, left leg: Secondary | ICD-10-CM | POA: Diagnosis not present

## 2019-03-10 DIAGNOSIS — J45909 Unspecified asthma, uncomplicated: Secondary | ICD-10-CM | POA: Diagnosis not present

## 2019-03-10 DIAGNOSIS — I509 Heart failure, unspecified: Secondary | ICD-10-CM | POA: Diagnosis not present

## 2019-03-10 DIAGNOSIS — M6281 Muscle weakness (generalized): Secondary | ICD-10-CM | POA: Diagnosis not present

## 2019-03-10 DIAGNOSIS — I110 Hypertensive heart disease with heart failure: Secondary | ICD-10-CM | POA: Diagnosis not present

## 2019-03-10 DIAGNOSIS — F17210 Nicotine dependence, cigarettes, uncomplicated: Secondary | ICD-10-CM | POA: Diagnosis not present

## 2019-03-10 DIAGNOSIS — Z89612 Acquired absence of left leg above knee: Secondary | ICD-10-CM | POA: Diagnosis not present

## 2019-03-10 DIAGNOSIS — F329 Major depressive disorder, single episode, unspecified: Secondary | ICD-10-CM | POA: Diagnosis not present

## 2019-03-10 DIAGNOSIS — Z4781 Encounter for orthopedic aftercare following surgical amputation: Secondary | ICD-10-CM | POA: Diagnosis not present

## 2019-03-10 DIAGNOSIS — I10 Essential (primary) hypertension: Secondary | ICD-10-CM | POA: Diagnosis not present

## 2019-03-10 DIAGNOSIS — I739 Peripheral vascular disease, unspecified: Secondary | ICD-10-CM | POA: Diagnosis not present

## 2019-03-11 DIAGNOSIS — F329 Major depressive disorder, single episode, unspecified: Secondary | ICD-10-CM | POA: Diagnosis not present

## 2019-03-11 DIAGNOSIS — Z4781 Encounter for orthopedic aftercare following surgical amputation: Secondary | ICD-10-CM | POA: Diagnosis not present

## 2019-03-11 DIAGNOSIS — I70202 Unspecified atherosclerosis of native arteries of extremities, left leg: Secondary | ICD-10-CM | POA: Diagnosis not present

## 2019-03-11 DIAGNOSIS — Z89612 Acquired absence of left leg above knee: Secondary | ICD-10-CM | POA: Diagnosis not present

## 2019-03-11 DIAGNOSIS — J45909 Unspecified asthma, uncomplicated: Secondary | ICD-10-CM | POA: Diagnosis not present

## 2019-03-11 DIAGNOSIS — F319 Bipolar disorder, unspecified: Secondary | ICD-10-CM | POA: Diagnosis not present

## 2019-03-12 ENCOUNTER — Emergency Department (HOSPITAL_COMMUNITY)
Admission: EM | Admit: 2019-03-12 | Discharge: 2019-03-12 | Disposition: A | Discharge status: Home / Self Care | Payer: Medicare Other | Attending: Emergency Medicine | Admitting: Emergency Medicine

## 2019-03-12 ENCOUNTER — Telehealth: Payer: Self-pay | Admitting: *Deleted

## 2019-03-12 ENCOUNTER — Emergency Department (HOSPITAL_COMMUNITY): Payer: Medicare Other

## 2019-03-12 ENCOUNTER — Other Ambulatory Visit: Payer: Self-pay

## 2019-03-12 DIAGNOSIS — I110 Hypertensive heart disease with heart failure: Secondary | ICD-10-CM | POA: Insufficient documentation

## 2019-03-12 DIAGNOSIS — Z79899 Other long term (current) drug therapy: Secondary | ICD-10-CM | POA: Insufficient documentation

## 2019-03-12 DIAGNOSIS — M2550 Pain in unspecified joint: Secondary | ICD-10-CM | POA: Diagnosis not present

## 2019-03-12 DIAGNOSIS — W19XXXA Unspecified fall, initial encounter: Secondary | ICD-10-CM | POA: Diagnosis not present

## 2019-03-12 DIAGNOSIS — F29 Unspecified psychosis not due to a substance or known physiological condition: Secondary | ICD-10-CM | POA: Diagnosis not present

## 2019-03-12 DIAGNOSIS — Z7982 Long term (current) use of aspirin: Secondary | ICD-10-CM | POA: Diagnosis not present

## 2019-03-12 DIAGNOSIS — I959 Hypotension, unspecified: Secondary | ICD-10-CM | POA: Diagnosis not present

## 2019-03-12 DIAGNOSIS — R457 State of emotional shock and stress, unspecified: Secondary | ICD-10-CM | POA: Diagnosis not present

## 2019-03-12 DIAGNOSIS — M79605 Pain in left leg: Secondary | ICD-10-CM | POA: Insufficient documentation

## 2019-03-12 DIAGNOSIS — I10 Essential (primary) hypertension: Secondary | ICD-10-CM | POA: Diagnosis not present

## 2019-03-12 DIAGNOSIS — Z89612 Acquired absence of left leg above knee: Secondary | ICD-10-CM | POA: Diagnosis not present

## 2019-03-12 DIAGNOSIS — F17210 Nicotine dependence, cigarettes, uncomplicated: Secondary | ICD-10-CM | POA: Diagnosis not present

## 2019-03-12 DIAGNOSIS — I509 Heart failure, unspecified: Secondary | ICD-10-CM | POA: Insufficient documentation

## 2019-03-12 DIAGNOSIS — Z7401 Bed confinement status: Secondary | ICD-10-CM | POA: Diagnosis not present

## 2019-03-12 MED ORDER — ALBUTEROL SULFATE HFA 108 (90 BASE) MCG/ACT IN AERS: 1.0000 | INHALATION_SPRAY | Freq: Four times a day (QID) | RESPIRATORY_TRACT | Status: DC | PRN

## 2019-03-12 MED ORDER — GABAPENTIN 400 MG PO CAPS
800.0000 mg | ORAL_CAPSULE | Freq: Once | ORAL | Status: AC
  Administered 2019-03-12: 800 mg via ORAL
  Filled 2019-03-12: qty 2

## 2019-03-12 MED ORDER — ATORVASTATIN CALCIUM 40 MG PO TABS: 40.0000 mg | ORAL_TABLET | Freq: Every day | ORAL | Status: DC

## 2019-03-12 MED ORDER — OXYCODONE-ACETAMINOPHEN 5-325 MG PO TABS
1.0000 | ORAL_TABLET | Freq: Once | ORAL | Status: AC
  Administered 2019-03-12: 1 via ORAL
  Filled 2019-03-12: qty 1

## 2019-03-12 MED ORDER — SODIUM CHLORIDE 0.9 % IV BOLUS: 500.0000 mL | Freq: Once | INTRAVENOUS | Status: DC

## 2019-03-12 MED ORDER — LURASIDONE HCL 40 MG PO TABS
80.0000 mg | ORAL_TABLET | Freq: Once | ORAL | Status: AC
  Administered 2019-03-12: 80 mg via ORAL
  Filled 2019-03-12: qty 2

## 2019-03-12 NOTE — ED Triage Notes (Signed)
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 Pt alert and oriented x 4 ; bleeding controlled at this time

## 2019-03-12 NOTE — Telephone Encounter (Signed)
Patient now lives at [place]. Number added  To chart.

## 2019-03-12 NOTE — Telephone Encounter (Signed)
No new notes

## 2019-03-12 NOTE — ED Notes (Signed)
[facility] called @ 1624-per Alexa, RN-called by Levada Dy

## 2019-03-12 NOTE — ED Notes (Addendum)
Pt refusing blood draw and iv fluids ; Courtney, PA aware and will come and talk to patient

## 2019-03-12 NOTE — Discharge Instructions (Signed)
Please follow up with your primary care provider within 5-7 days for re-evaluation of your symptoms. If you do not have a primary care provider, information for a healthcare clinic has been provided for you to make arrangements for follow up care.  Please return to the emergency room immediately if you experience any new or worsening symptoms or any symptoms that indicate worsening infection such as fevers, increased redness/swelling/pain, warmth, or drainage from the affected area.    

## 2019-03-12 NOTE — ED Provider Notes (Signed)
[facility] EMERGENCY DEPARTMENT Provider Note   CSN: [id number] Arrival date & time: 03/12/19  1227    History   Chief Complaint Chief Complaint  Patient presents with  . Wound Dehiscence    HPI Alisha [name] is a 58 y.o. female.     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  Patient states that she was transferring from her wheelchair to her bed.  1 side of the wheelchair was unlocked and when she held onto the side of the wheelchair she fell straight onto her left lower extremity wound.  She denies any other injuries.  She denies head trauma or LOC.  She is complaining of pain to the left lower extremity along the wound.  Nursing was concerned because they thought they saw some staples fall out of the wound.  Patient denies any fevers.  She has had some serosanguineous drainage from the wound but no purulent drainage.  Past Medical History:  Diagnosis Date  . Anxiety   . Asthma   . Bipolar disorder ([place])   . Breast lump 02/2008   Biopsy 05/2008: showed no evidence of malignancy  . Breast mass in female    bi lat   . CHF (congestive heart failure) ([place])   . Chronic pain syndrome   . Degenerative joint disease of knee   . Dental caries   . Depression   . 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 Endometrial mass 10/12/2012   Endometrium, biopsy on 11/04/12 - PROLIFERATIVE ENDOMETRIUM AND ABUNDANT MUCUS. NO HYPERPLASIA OR CARCINOMA.   . Fibromyalgia   . Hyperlipidemia   . Hypertension   . Irregular menses 9/08  . Joint pain   . Mixed stress and urge urinary incontinence    Being followed by [facility], underwent cystoscopy & uroflowmetry   . Pulmonary edema   . PVD  (peripheral vascular disease) ([place])    s/p L fem-pop bypass 11/21/08; graft occluded 12/28/08;  aortogram w/ bilat LE runoff: 1.  bilat diffuse SFA occlusive dz, 2.  Mod to severe above-knee popliteal dz,  3.  Bilat 3-vessel runoff w/ mild tibial occlusive dz.  . Restless leg syndrome   . Sigmoid diverticulitis 10/26/2012  . Tobacco abuse   . Wears glasses     Patient Active Problem List   Diagnosis Date Noted  . Gangrene of left foot ([place]) 02/14/2019  . Loss of consciousness ([place]) 02/15/2018  . Asthma 10/07/2016  . Colonic diverticular abscess   . Anxiety 08/28/2014  . Breast pain, left 08/11/2014  . Abnormal ECG 01/28/2014  . Health care maintenance 09/16/2013  . Metrorrhagia 10/12/2012  . Endometrial mass 10/12/2012  . 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 Osteoarthritis of both knees 02/15/2012  . Chronic pain syndrome 07/05/2010  . Venous thromboembolism 02/22/2010  . Depression 07/17/2009  . 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 TOBACCO ABUSE 10/20/2006  . Essential hypertension 10/20/2006  . Peripheral vascular disease ([place]) 10/20/2006  . HYPERLIPIDEMIA, MIXED 10/10/2006    Past Surgical History:  Procedure Laterality Date  .  Right external iliac artery stent  11/04/2008  . ABDOMINAL AORTAGRAM     11/04/2008; 10/14/2006  . AMPUTATION Left 02/16/2019   Procedure: AMPUTATION ABOVE  KNEE;  Surgeon: Angelia Mould, MD;  Location: [facility];  Service: Vascular;  Laterality: Left;  . BREAST LUMPECTOMY W/ NEEDLE LOCALIZATION Right 06/13/2008  . BREAST LUMPECTOMY WITH RADIOACTIVE SEED LOCALIZATION Bilateral 05/03/2014   Procedure: BREAST LUMPECTOMY WITH RADIOACTIVE SEED LOCALIZATION;  Surgeon: Joyice Faster. Cornett, MD;  Location: [facility];  Service: General;  Laterality: Bilateral;  . COLONOSCOPY WITH PROPOFOL  01/25/2014  . FEMORAL ENDARTERECTOMY Left 11/21/2008  . FEMORAL-POPLITEAL BYPASS GRAFT Left 11/21/2008  . LEFT HEART CATHETERIZATION  WITH CORONARY ANGIOGRAM N/A 02/25/2014   Procedure: LEFT HEART CATHETERIZATION WITH CORONARY ANGIOGRAM;  Surgeon: Peter M Martinique, MD;  Location: [facility] CATH LAB;  Service: Cardiovascular;  Laterality: N/A;  . TONSILLECTOMY    . TUBAL LIGATION       OB History    Gravida  6   Para  4   Term  4   Preterm      AB  2   Living  4     SAB  1   TAB  1   Ectopic      Multiple      Live Births               Home Medications    Prior to Admission medications   Medication Sig Start Date End Date Taking? Authorizing Provider  albuterol (VENTOLIN HFA) 108 (90 Base) MCG/ACT inhaler Inhale 1-2 puffs into the lungs every 6 (six) hours as needed for wheezing or shortness of breath. 02/18/18   Tawny Asal, MD  aspirin EC 81 MG tablet Take 81 mg by mouth daily.    [provider]  atorvastatin (LIPITOR) 40 MG tablet Take 1 tablet (40 mg total) by mouth at bedtime. Patient taking differently: Take 40 mg by mouth daily.  02/17/18   Tawny Asal, MD  carvedilol (COREG) 12.5 MG tablet Take 1 tablet (12.5 mg total) by mouth 2 (two) times daily with a meal. 02/17/18   Tawny Asal, MD  diazepam (VALIUM) 5 MG tablet Take 5 mg by mouth 3 (three) times daily.     [provider]  DULoxetine (CYMBALTA) 60 MG capsule Take 60 mg by mouth 2 (two) times daily.     [provider]  escitalopram (LEXAPRO) 10 MG tablet Take 10 mg by mouth daily.    [provider]  gabapentin (NEURONTIN) 800 MG tablet Take 800 mg by mouth 4 (four) times daily.    [provider]  hydrochlorothiazide (HYDRODIURIL) 25 MG tablet Take 1 tablet (25 mg total) by mouth daily. 02/17/18   Tawny Asal, MD  hydrOXYzine (ATARAX/VISTARIL) 50 MG tablet Take 50 mg by mouth See admin instructions. Take 50 mg by mouth at bedtime and an additional 50 mg two times a day as needed for anxiety    [provider]  losartan (COZAAR) 100 MG tablet Take 1 tablet (100 mg total) by mouth daily.  01/06/19 01/01/20  Nahser, Wonda Cheng, MD  Lurasidone HCl (LATUDA) 120 MG TABS Take 80 mg by mouth 2 (two) times daily.     [provider]  oxyCODONE-acetaminophen (PERCOCET/ROXICET) 5-325 MG tablet Take 1 tablet by mouth every 4 (four) hours as needed for moderate pain. 02/19/19   Ulyses Amor, PA-C  potassium chloride (K-DUR) 10 MEQ tablet Take 10 mEq by mouth daily. 10/19/18   [provider]  vitamin E (VITAMIN E) 400 UNIT capsule Take 800 Units by mouth 2 (two) times daily.    [provider]  XARELTO 10 MG TABS tablet Take 10 mg by mouth daily. 10/19/18   [provider]    Family History Family History  Problem Relation Age of Onset  . Breast cancer Mother   . Depression Mother   . Hyperlipidemia Mother   . Mental retardation Mother   . Hyperlipidemia Father   . Mental retardation Father   . Breast cancer Other        GREAT AUNT  . Depression Sister   . Hyperlipidemia Sister   . Mental retardation Sister   . Mental retardation Daughter     Social History Social History   Tobacco Use  . Smoking status: Current Every Day Smoker    Packs/day: 0.50    Years: 18.00    Pack years: 9.00    Types: Cigarettes  . Smokeless tobacco: Never Used  . Tobacco comment: 1pk 1/2 PACK DAY 2-3 0r none  Substance Use Topics  . Alcohol use: Not Currently    Alcohol/week: 0.0 standard drinks  . Drug use: No    Comment: hx of marijuana and cocaine use; uses Suboxone patch     Allergies   Propoxyphene n-acetaminophen; Strawberry extract; Buspar [buspirone]; and Grapefruit extract   Review of Systems Review of Systems  Constitutional: Negative for fever.  HENT: Negative for ear pain and sore throat.   Eyes: Negative for visual disturbance.  Respiratory: Negative for cough and shortness of breath.   Cardiovascular: Negative for chest pain.  Gastrointestinal: Negative for abdominal pain, nausea and vomiting.  Genitourinary: Negative for dysuria and  hematuria.  Musculoskeletal: Negative for back pain.       Left lower extremity pain at wound site  Neurological: Negative for headaches.       No head trauma or LOC  All other systems reviewed and are negative.  Physical Exam Updated Vital Signs BP 109/69   Pulse 74   Temp 97.8 F (36.6 C) (Oral)   Resp 20   Ht 5\' 4"  (1.626 m)   Wt 82.6 kg   LMP 11/22/2015   SpO2 100%   BMI 31.26 kg/m   Physical Exam Vitals signs and nursing note reviewed.  Constitutional:      General: She is not in acute distress.    Appearance: She is well-developed.  HENT:     Head: Normocephalic and atraumatic.  Eyes:     Extraocular Movements: Extraocular movements intact.     Conjunctiva/sclera: Conjunctivae normal.     Pupils: Pupils are equal, round, and reactive to light.  Neck:     Musculoskeletal: Neck supple.  Cardiovascular:     Rate and Rhythm: Normal rate and regular rhythm.     Heart sounds: Normal heart sounds. No murmur.  Pulmonary:     Effort: Pulmonary effort is normal. No respiratory distress.     Breath sounds: Normal breath sounds. No stridor. No wheezing or rhonchi.  Abdominal:     General: Bowel sounds are normal.     Palpations: Abdomen is soft.     Tenderness: There is no abdominal tenderness.  Musculoskeletal:     Comments: S/p AKA to the left lower extremity.  Tenderness along the wound site.  No significant erythema, warmth, or fluctuance.  No purulent drainage from the wound.  No evidence of dehiscence.  Staples all appear to be in place.  No midline tenderness of cervical, thoracic or lumbar spine.  Moving all extremities.  Skin:    General: Skin is warm and dry.  Neurological:  Mental Status: She is alert.     Comments: Patient answering questions appropriately.  She is somewhat sleepy and easily arousable(she states she becomes sleepy with her Valium which she took prior to arrival).  She is oriented x4.  Moving all extremities with normal strength and  sensation.  Cranial nerves II through XII intact.      ED Treatments / Results  Labs (all labs ordered are listed, but only abnormal results are displayed) Labs Reviewed - No data to display  EKG None  Radiology Dg Femur Min 2 Views Left  Result Date: 03/12/2019 CLINICAL DATA:  [place].  Recent amputation. EXAM: LEFT FEMUR 2 VIEWS COMPARISON:  None. FINDINGS: Evidence of recent mid femoral amputation. The osteotomy site appears normal. No acute fracture. The left hip is normally located. Advanced vascular calcifications for age. IMPRESSION: Expected postoperative changes. No acute fracture or hip dislocation. Electronically Signed   By: Marijo Sanes M.D.   On: 03/12/2019 13:44    Procedures Procedures (including critical care time)  Medications Ordered in ED Medications  sodium chloride 0.9 % bolus 500 mL (has no administration in time range)     Initial Impression / Assessment and Plan / ED Course  I have reviewed the triage vital signs and the nursing notes.  Pertinent labs & imaging results that were available during my care of the patient were reviewed by me and considered in my medical decision making (see chart for details).     Final Clinical Impressions(s) / ED Diagnoses   Final diagnoses:  Fall, initial encounter  Left leg pain   Patient presenting after mechanical fall from her wheelchair to the floor prior to arrival.  Landed on her left lower extremity AKA.  She is having pain at the site of the wound.  The wound does not appear to be dehisced.  Staples do appear to be intact.  No purulent drainage from the wound.  No evidence of infection at this time.  X-ray shows expected postoperative changes but no acute fracture or hip dislocation.  On re-eval pt BP low in the 90s. Labs added and small fluid bolus ordered.  On re-eval pt requesting to leave. She is sitting up in bed eating a sandwich and drinking fluids in no distress. BP has improved. Discussed plan for  lab work however she declines which I feel is reasonable given that her BP has improved.   Discussed results of xray.  I do not see any dehiscence of the wound.  Does not appear infected.  All staples appear to be in place.  Have advised to follow-up with her PCP and return to the ER for new or worsening symptoms including signs of infection.  She voices understanding the plan and reasons return.  All questions answered.  Patient stable discharge.  ED Discharge Orders    None       Bishop Dublin 03/12/19 1612    Drenda Freeze, MD 03/13/19 1505

## 2019-03-15 DIAGNOSIS — J45909 Unspecified asthma, uncomplicated: Secondary | ICD-10-CM | POA: Diagnosis not present

## 2019-03-15 DIAGNOSIS — Z89612 Acquired absence of left leg above knee: Secondary | ICD-10-CM | POA: Diagnosis not present

## 2019-03-15 DIAGNOSIS — Z4781 Encounter for orthopedic aftercare following surgical amputation: Secondary | ICD-10-CM | POA: Diagnosis not present

## 2019-03-15 DIAGNOSIS — I70202 Unspecified atherosclerosis of native arteries of extremities, left leg: Secondary | ICD-10-CM | POA: Diagnosis not present

## 2019-03-15 DIAGNOSIS — F319 Bipolar disorder, unspecified: Secondary | ICD-10-CM | POA: Diagnosis not present

## 2019-03-15 DIAGNOSIS — F329 Major depressive disorder, single episode, unspecified: Secondary | ICD-10-CM | POA: Diagnosis not present

## 2019-03-16 DIAGNOSIS — Z89612 Acquired absence of left leg above knee: Secondary | ICD-10-CM | POA: Diagnosis not present

## 2019-03-16 DIAGNOSIS — J45909 Unspecified asthma, uncomplicated: Secondary | ICD-10-CM | POA: Diagnosis not present

## 2019-03-16 DIAGNOSIS — I70202 Unspecified atherosclerosis of native arteries of extremities, left leg: Secondary | ICD-10-CM | POA: Diagnosis not present

## 2019-03-16 DIAGNOSIS — F329 Major depressive disorder, single episode, unspecified: Secondary | ICD-10-CM | POA: Diagnosis not present

## 2019-03-16 DIAGNOSIS — F319 Bipolar disorder, unspecified: Secondary | ICD-10-CM | POA: Diagnosis not present

## 2019-03-16 DIAGNOSIS — Z4781 Encounter for orthopedic aftercare following surgical amputation: Secondary | ICD-10-CM | POA: Diagnosis not present

## 2019-03-17 DIAGNOSIS — I70202 Unspecified atherosclerosis of native arteries of extremities, left leg: Secondary | ICD-10-CM | POA: Diagnosis not present

## 2019-03-17 DIAGNOSIS — G546 Phantom limb syndrome with pain: Secondary | ICD-10-CM | POA: Diagnosis not present

## 2019-03-17 DIAGNOSIS — F319 Bipolar disorder, unspecified: Secondary | ICD-10-CM | POA: Diagnosis not present

## 2019-03-17 DIAGNOSIS — F329 Major depressive disorder, single episode, unspecified: Secondary | ICD-10-CM | POA: Diagnosis not present

## 2019-03-17 DIAGNOSIS — W19XXXA Unspecified fall, initial encounter: Secondary | ICD-10-CM | POA: Diagnosis not present

## 2019-03-17 DIAGNOSIS — Z4781 Encounter for orthopedic aftercare following surgical amputation: Secondary | ICD-10-CM | POA: Diagnosis not present

## 2019-03-17 DIAGNOSIS — Z9181 History of falling: Secondary | ICD-10-CM | POA: Diagnosis not present

## 2019-03-17 DIAGNOSIS — J45909 Unspecified asthma, uncomplicated: Secondary | ICD-10-CM | POA: Diagnosis not present

## 2019-03-17 DIAGNOSIS — Z89612 Acquired absence of left leg above knee: Secondary | ICD-10-CM | POA: Diagnosis not present

## 2019-03-18 DIAGNOSIS — I110 Hypertensive heart disease with heart failure: Secondary | ICD-10-CM | POA: Diagnosis not present

## 2019-03-18 DIAGNOSIS — J45909 Unspecified asthma, uncomplicated: Secondary | ICD-10-CM | POA: Diagnosis not present

## 2019-03-18 DIAGNOSIS — Z89612 Acquired absence of left leg above knee: Secondary | ICD-10-CM | POA: Diagnosis not present

## 2019-03-18 DIAGNOSIS — F319 Bipolar disorder, unspecified: Secondary | ICD-10-CM | POA: Diagnosis not present

## 2019-03-18 DIAGNOSIS — F329 Major depressive disorder, single episode, unspecified: Secondary | ICD-10-CM | POA: Diagnosis not present

## 2019-03-18 DIAGNOSIS — I70202 Unspecified atherosclerosis of native arteries of extremities, left leg: Secondary | ICD-10-CM | POA: Diagnosis not present

## 2019-03-18 DIAGNOSIS — Z4781 Encounter for orthopedic aftercare following surgical amputation: Secondary | ICD-10-CM | POA: Diagnosis not present

## 2019-03-22 DIAGNOSIS — F329 Major depressive disorder, single episode, unspecified: Secondary | ICD-10-CM | POA: Diagnosis not present

## 2019-03-22 DIAGNOSIS — J45909 Unspecified asthma, uncomplicated: Secondary | ICD-10-CM | POA: Diagnosis not present

## 2019-03-22 DIAGNOSIS — F319 Bipolar disorder, unspecified: Secondary | ICD-10-CM | POA: Diagnosis not present

## 2019-03-22 DIAGNOSIS — Z4781 Encounter for orthopedic aftercare following surgical amputation: Secondary | ICD-10-CM | POA: Diagnosis not present

## 2019-03-22 DIAGNOSIS — I70202 Unspecified atherosclerosis of native arteries of extremities, left leg: Secondary | ICD-10-CM | POA: Diagnosis not present

## 2019-03-22 DIAGNOSIS — Z89612 Acquired absence of left leg above knee: Secondary | ICD-10-CM | POA: Diagnosis not present

## 2019-03-24 ENCOUNTER — Telehealth (HOSPITAL_COMMUNITY): Payer: Self-pay | Admitting: Rehabilitation

## 2019-03-25 ENCOUNTER — Encounter: Payer: Self-pay | Admitting: Family

## 2019-03-25 ENCOUNTER — Encounter: Payer: Medicare Other | Admitting: Vascular Surgery

## 2019-03-25 DIAGNOSIS — F319 Bipolar disorder, unspecified: Secondary | ICD-10-CM | POA: Diagnosis not present

## 2019-03-25 DIAGNOSIS — Z4781 Encounter for orthopedic aftercare following surgical amputation: Secondary | ICD-10-CM | POA: Diagnosis not present

## 2019-03-25 DIAGNOSIS — I70202 Unspecified atherosclerosis of native arteries of extremities, left leg: Secondary | ICD-10-CM | POA: Diagnosis not present

## 2019-03-25 DIAGNOSIS — Z89612 Acquired absence of left leg above knee: Secondary | ICD-10-CM | POA: Diagnosis not present

## 2019-03-25 DIAGNOSIS — J45909 Unspecified asthma, uncomplicated: Secondary | ICD-10-CM | POA: Diagnosis not present

## 2019-03-25 DIAGNOSIS — F329 Major depressive disorder, single episode, unspecified: Secondary | ICD-10-CM | POA: Diagnosis not present

## 2019-03-25 DIAGNOSIS — R52 Pain, unspecified: Secondary | ICD-10-CM | POA: Diagnosis not present

## 2019-03-25 DIAGNOSIS — T8140XA Infection following a procedure, unspecified, initial encounter: Secondary | ICD-10-CM | POA: Diagnosis not present

## 2019-03-26 ENCOUNTER — Telehealth: Payer: Self-pay | Admitting: Vascular Surgery

## 2019-03-26 NOTE — Telephone Encounter (Signed)
Alisha [name] from [facility] called yesterday afternoon, 03/25/19.  Pt had trouble making her Vascular appt yesterday due to lack of Transportation at the [employer].  She also had new findings to her incision site.  Pt was able to do a Tele-Call with a Nurse Practitioner regarding her incision site.  Pt or facility will call back to reschedule Vascular appt.    Thurston Hole., LPN

## 2019-03-29 ENCOUNTER — Inpatient Hospital Stay (HOSPITAL_COMMUNITY)
Admission: EM | Admit: 2019-03-29 | Discharge: 2019-04-08 | DRG: 464 | Disposition: A | Discharge status: Skilled Nursing Facility | Payer: Medicare Other | Source: Skilled Nursing Facility | Attending: Internal Medicine | Admitting: Internal Medicine

## 2019-03-29 ENCOUNTER — Emergency Department (HOSPITAL_COMMUNITY): Payer: Medicare Other | Admitting: Certified Registered Nurse Anesthetist

## 2019-03-29 ENCOUNTER — Encounter (HOSPITAL_COMMUNITY)
Admission: EM | Disposition: A | Discharge status: Skilled Nursing Facility | Payer: Self-pay | Source: Skilled Nursing Facility | Attending: Internal Medicine

## 2019-03-29 ENCOUNTER — Encounter (HOSPITAL_COMMUNITY): Payer: Self-pay | Admitting: Certified Registered Nurse Anesthetist

## 2019-03-29 ENCOUNTER — Other Ambulatory Visit: Payer: Self-pay

## 2019-03-29 DIAGNOSIS — R52 Pain, unspecified: Secondary | ICD-10-CM | POA: Diagnosis not present

## 2019-03-29 DIAGNOSIS — Z86718 Personal history of other venous thrombosis and embolism: Secondary | ICD-10-CM | POA: Diagnosis not present

## 2019-03-29 DIAGNOSIS — I70202 Unspecified atherosclerosis of native arteries of extremities, left leg: Secondary | ICD-10-CM | POA: Diagnosis not present

## 2019-03-29 DIAGNOSIS — S78112D Complete traumatic amputation at level between left hip and knee, subsequent encounter: Secondary | ICD-10-CM | POA: Diagnosis not present

## 2019-03-29 DIAGNOSIS — Z810 Family history of intellectual disabilities: Secondary | ICD-10-CM

## 2019-03-29 DIAGNOSIS — F329 Major depressive disorder, single episode, unspecified: Secondary | ICD-10-CM | POA: Diagnosis not present

## 2019-03-29 DIAGNOSIS — Z91018 Allergy to other foods: Secondary | ICD-10-CM | POA: Diagnosis not present

## 2019-03-29 DIAGNOSIS — I959 Hypotension, unspecified: Secondary | ICD-10-CM | POA: Diagnosis not present

## 2019-03-29 DIAGNOSIS — R279 Unspecified lack of coordination: Secondary | ICD-10-CM | POA: Diagnosis not present

## 2019-03-29 DIAGNOSIS — E785 Hyperlipidemia, unspecified: Secondary | ICD-10-CM | POA: Diagnosis not present

## 2019-03-29 DIAGNOSIS — F17210 Nicotine dependence, cigarettes, uncomplicated: Secondary | ICD-10-CM | POA: Diagnosis present

## 2019-03-29 DIAGNOSIS — F319 Bipolar disorder, unspecified: Secondary | ICD-10-CM | POA: Diagnosis present

## 2019-03-29 DIAGNOSIS — F17200 Nicotine dependence, unspecified, uncomplicated: Secondary | ICD-10-CM

## 2019-03-29 DIAGNOSIS — I8290 Acute embolism and thrombosis of unspecified vein: Secondary | ICD-10-CM | POA: Diagnosis not present

## 2019-03-29 DIAGNOSIS — J9601 Acute respiratory failure with hypoxia: Secondary | ICD-10-CM | POA: Diagnosis present

## 2019-03-29 DIAGNOSIS — Z818 Family history of other mental and behavioral disorders: Secondary | ICD-10-CM

## 2019-03-29 DIAGNOSIS — Y838 Other surgical procedures as the cause of abnormal reaction of the patient, or of later complication, without mention of misadventure at the time of the procedure: Secondary | ICD-10-CM | POA: Diagnosis present

## 2019-03-29 DIAGNOSIS — G2581 Restless legs syndrome: Secondary | ICD-10-CM | POA: Diagnosis present

## 2019-03-29 DIAGNOSIS — Y835 Amputation of limb(s) as the cause of abnormal reaction of the patient, or of later complication, without mention of misadventure at the time of the procedure: Secondary | ICD-10-CM | POA: Diagnosis present

## 2019-03-29 DIAGNOSIS — T8754 Necrosis of amputation stump, left lower extremity: Secondary | ICD-10-CM | POA: Diagnosis not present

## 2019-03-29 DIAGNOSIS — Z743 Need for continuous supervision: Secondary | ICD-10-CM | POA: Diagnosis not present

## 2019-03-29 DIAGNOSIS — I5042 Chronic combined systolic (congestive) and diastolic (congestive) heart failure: Secondary | ICD-10-CM

## 2019-03-29 DIAGNOSIS — I739 Peripheral vascular disease, unspecified: Secondary | ICD-10-CM

## 2019-03-29 DIAGNOSIS — Z886 Allergy status to analgesic agent status: Secondary | ICD-10-CM | POA: Diagnosis not present

## 2019-03-29 DIAGNOSIS — M6281 Muscle weakness (generalized): Secondary | ICD-10-CM | POA: Diagnosis present

## 2019-03-29 DIAGNOSIS — Z89612 Acquired absence of left leg above knee: Secondary | ICD-10-CM | POA: Diagnosis not present

## 2019-03-29 DIAGNOSIS — F339 Major depressive disorder, recurrent, unspecified: Secondary | ICD-10-CM | POA: Diagnosis present

## 2019-03-29 DIAGNOSIS — I110 Hypertensive heart disease with heart failure: Secondary | ICD-10-CM | POA: Diagnosis present

## 2019-03-29 DIAGNOSIS — I10 Essential (primary) hypertension: Secondary | ICD-10-CM

## 2019-03-29 DIAGNOSIS — L089 Local infection of the skin and subcutaneous tissue, unspecified: Secondary | ICD-10-CM

## 2019-03-29 DIAGNOSIS — G894 Chronic pain syndrome: Secondary | ICD-10-CM | POA: Diagnosis present

## 2019-03-29 DIAGNOSIS — Z8349 Family history of other endocrine, nutritional and metabolic diseases: Secondary | ICD-10-CM | POA: Diagnosis not present

## 2019-03-29 DIAGNOSIS — T8744 Infection of amputation stump, left lower extremity: Secondary | ICD-10-CM | POA: Diagnosis not present

## 2019-03-29 DIAGNOSIS — F418 Other specified anxiety disorders: Secondary | ICD-10-CM | POA: Diagnosis not present

## 2019-03-29 DIAGNOSIS — R5381 Other malaise: Secondary | ICD-10-CM | POA: Diagnosis not present

## 2019-03-29 DIAGNOSIS — F419 Anxiety disorder, unspecified: Secondary | ICD-10-CM | POA: Diagnosis present

## 2019-03-29 DIAGNOSIS — M797 Fibromyalgia: Secondary | ICD-10-CM | POA: Diagnosis not present

## 2019-03-29 DIAGNOSIS — T8781 Dehiscence of amputation stump: Secondary | ICD-10-CM | POA: Diagnosis not present

## 2019-03-29 DIAGNOSIS — Z4781 Encounter for orthopedic aftercare following surgical amputation: Secondary | ICD-10-CM | POA: Diagnosis not present

## 2019-03-29 DIAGNOSIS — T8789 Other complications of amputation stump: Secondary | ICD-10-CM | POA: Diagnosis not present

## 2019-03-29 DIAGNOSIS — Z888 Allergy status to other drugs, medicaments and biological substances status: Secondary | ICD-10-CM

## 2019-03-29 DIAGNOSIS — Z7982 Long term (current) use of aspirin: Secondary | ICD-10-CM | POA: Diagnosis not present

## 2019-03-29 DIAGNOSIS — T148XXA Other injury of unspecified body region, initial encounter: Secondary | ICD-10-CM

## 2019-03-29 DIAGNOSIS — J45909 Unspecified asthma, uncomplicated: Secondary | ICD-10-CM | POA: Diagnosis present

## 2019-03-29 DIAGNOSIS — R262 Difficulty in walking, not elsewhere classified: Secondary | ICD-10-CM | POA: Diagnosis present

## 2019-03-29 DIAGNOSIS — R41841 Cognitive communication deficit: Secondary | ICD-10-CM | POA: Diagnosis present

## 2019-03-29 DIAGNOSIS — Z803 Family history of malignant neoplasm of breast: Secondary | ICD-10-CM | POA: Diagnosis not present

## 2019-03-29 DIAGNOSIS — J449 Chronic obstructive pulmonary disease, unspecified: Secondary | ICD-10-CM | POA: Diagnosis present

## 2019-03-29 HISTORY — PX: I & D EXTREMITY: SHX5045

## 2019-03-29 LAB — COMPREHENSIVE METABOLIC PANEL
ALT: 15 U/L (ref 0–44)
AST: 18 U/L (ref 15–41)
Albumin: 3.1 g/dL — ABNORMAL LOW (ref 3.5–5.0)
Alkaline Phosphatase: 68 U/L (ref 38–126)
Anion gap: 11 (ref 5–15)
BUN: 21 mg/dL — ABNORMAL HIGH (ref 6–20)
CO2: 23 mmol/L (ref 22–32)
CREATININE: 1.09 mg/dL — AB (ref 0.44–1.00)
Calcium: 9.2 mg/dL (ref 8.9–10.3)
Chloride: 103 mmol/L (ref 98–111)
GFR calc Af Amer: >60 mL/min (ref >60)
GFR calc non Af Amer: 57 mL/min — ABNORMAL LOW (ref >60)
Glucose, Bld: 100 mg/dL — ABNORMAL HIGH (ref 70–99)
Potassium: 4.3 mmol/L (ref 3.5–5.1)
Sodium: 137 mmol/L (ref 135–145)
Total Bilirubin: 0.8 mg/dL (ref 0.3–1.2)
Total Protein: 7.9 g/dL (ref 6.5–8.1)

## 2019-03-29 LAB — CBC WITH DIFFERENTIAL/PLATELET
ABS IMMATURE GRANULOCYTES: 0.08 10*3/uL — AB (ref 0.00–0.07)
Basophils Absolute: 0.1 10*3/uL (ref 0.0–0.1)
Basophils Relative: 1 %
Eosinophils Absolute: 0.2 10*3/uL (ref 0.0–0.5)
Eosinophils Relative: 2 %
HCT: 37.1 % (ref 36.0–46.0)
Hemoglobin: 11.3 g/dL — ABNORMAL LOW (ref 12.0–15.0)
IMMATURE GRANULOCYTES: 1 %
Lymphocytes Relative: 22 %
Lymphs Abs: 2.7 10*3/uL (ref 0.7–4.0)
MCH: 30.4 pg (ref 26.0–34.0)
MCHC: 30.5 g/dL (ref 30.0–36.0)
MCV: 99.7 fL (ref 80.0–100.0)
Monocytes Absolute: 1.1 10*3/uL — ABNORMAL HIGH (ref 0.1–1.0)
Monocytes Relative: 9 %
NEUTROS PCT: 65 %
Neutro Abs: 7.9 10*3/uL — ABNORMAL HIGH (ref 1.7–7.7)
Platelets: 380 10*3/uL (ref 150–400)
RBC: 3.72 MIL/uL — ABNORMAL LOW (ref 3.87–5.11)
RDW: 12.9 % (ref 11.5–15.5)
WBC: 12 10*3/uL — ABNORMAL HIGH (ref 4.0–10.5)
nRBC: 0 % (ref 0.0–0.2)

## 2019-03-29 LAB — HEPARIN LEVEL (UNFRACTIONATED): Heparin Unfractionated: 1.62 IU/mL — ABNORMAL HIGH (ref 0.30–0.70)

## 2019-03-29 LAB — LACTIC ACID, PLASMA
Lactic Acid, Venous: 1.4 mmol/L (ref 0.5–1.9)
Lactic Acid, Venous: 1.1 mmol/L (ref 0.5–1.9)

## 2019-03-29 LAB — APTT: aPTT: 33 seconds (ref 24–36)

## 2019-03-29 SURGERY — IRRIGATION AND DEBRIDEMENT EXTREMITY
Anesthesia: Choice | Laterality: Left

## 2019-03-29 SURGERY — IRRIGATION AND DEBRIDEMENT EXTREMITY
Anesthesia: General | Laterality: Left

## 2019-03-29 MED ORDER — SUCCINYLCHOLINE CHLORIDE 200 MG/10ML IV SOSY
PREFILLED_SYRINGE | INTRAVENOUS | Status: AC
  Filled 2019-03-29: qty 10

## 2019-03-29 MED ORDER — FENTANYL CITRATE (PF) 250 MCG/5ML IJ SOLN
INTRAMUSCULAR | Status: AC
  Filled 2019-03-29: qty 5

## 2019-03-29 MED ORDER — FENTANYL CITRATE (PF) 100 MCG/2ML IJ SOLN
25.0000 ug | INTRAMUSCULAR | Status: DC | PRN
  Administered 2019-03-29: 25 ug via INTRAVENOUS
  Filled 2019-03-29: qty 2

## 2019-03-29 MED ORDER — SODIUM CHLORIDE 0.9 % IV BOLUS
500.0000 mL | Freq: Once | INTRAVENOUS | Status: AC
  Administered 2019-03-29: 500 mL via INTRAVENOUS

## 2019-03-29 MED ORDER — NICOTINE 7 MG/24HR TD PT24
7.0000 mg | MEDICATED_PATCH | Freq: Every day | TRANSDERMAL | Status: DC | PRN
  Administered 2019-03-30 – 2019-04-05 (×2): 7 mg via TRANSDERMAL
  Filled 2019-03-29 (×2): qty 1

## 2019-03-29 MED ORDER — POLYETHYLENE GLYCOL 3350 17 G PO PACK: 17.0000 g | PACK | Freq: Every day | ORAL | Status: DC | PRN

## 2019-03-29 MED ORDER — FENTANYL CITRATE (PF) 100 MCG/2ML IJ SOLN
25.0000 ug | INTRAMUSCULAR | Status: DC | PRN
  Administered 2019-03-30: 50 ug via INTRAVENOUS
  Filled 2019-03-29: qty 2

## 2019-03-29 MED ORDER — SODIUM CHLORIDE 0.9 % IV SOLN
250.0000 mL | INTRAVENOUS | Status: DC | PRN
  Administered 2019-04-02 (×2): via INTRAVENOUS

## 2019-03-29 MED ORDER — SODIUM CHLORIDE 0.9% FLUSH: 3.0000 mL | INTRAVENOUS | Status: DC | PRN

## 2019-03-29 MED ORDER — ONDANSETRON HCL 4 MG PO TABS: 4.0000 mg | ORAL_TABLET | Freq: Four times a day (QID) | ORAL | Status: DC | PRN

## 2019-03-29 MED ORDER — ONDANSETRON HCL 4 MG/2ML IJ SOLN: 4.0000 mg | Freq: Four times a day (QID) | INTRAMUSCULAR | Status: DC | PRN

## 2019-03-29 MED ORDER — SODIUM CHLORIDE 0.9% FLUSH
3.0000 mL | Freq: Two times a day (BID) | INTRAVENOUS | Status: DC
  Administered 2019-03-31 – 2019-04-08 (×7): 3 mL via INTRAVENOUS

## 2019-03-29 MED ORDER — MIDAZOLAM HCL 2 MG/2ML IJ SOLN
INTRAMUSCULAR | Status: AC
  Filled 2019-03-29: qty 2

## 2019-03-29 MED ORDER — HEPARIN (PORCINE) 25000 UT/250ML-% IV SOLN
1400.0000 [IU]/h | INTRAVENOUS | Status: DC
  Administered 2019-03-30: 1300 [IU]/h via INTRAVENOUS
  Administered 2019-03-30: 1150 [IU]/h via INTRAVENOUS
  Administered 2019-03-31 – 2019-04-01 (×2): 1400 [IU]/h via INTRAVENOUS
  Filled 2019-03-29 (×5): qty 250

## 2019-03-29 MED ORDER — LIDOCAINE 2% (20 MG/ML) 5 ML SYRINGE
INTRAMUSCULAR | Status: DC | PRN
  Administered 2019-03-29: 100 mg via INTRAVENOUS

## 2019-03-29 MED ORDER — PROPOFOL 10 MG/ML IV BOLUS
INTRAVENOUS | Status: DC | PRN
  Administered 2019-03-29: 150 mg via INTRAVENOUS
  Administered 2019-03-29: 20 mg via INTRAVENOUS

## 2019-03-29 MED ORDER — ONDANSETRON HCL 4 MG/2ML IJ SOLN
INTRAMUSCULAR | Status: DC | PRN
  Administered 2019-03-29: 4 mg via INTRAVENOUS

## 2019-03-29 MED ORDER — HYDROMORPHONE HCL 1 MG/ML IJ SOLN: 0.2500 mg | INTRAMUSCULAR | Status: DC | PRN

## 2019-03-29 MED ORDER — VANCOMYCIN HCL 10 G IV SOLR
1750.0000 mg | Freq: Once | INTRAVENOUS | Status: AC
  Administered 2019-03-29: 1750 mg via INTRAVENOUS
  Filled 2019-03-29: qty 1750

## 2019-03-29 MED ORDER — CLINDAMYCIN PHOSPHATE 600 MG/50ML IV SOLN
600.0000 mg | Freq: Once | INTRAVENOUS | Status: AC
  Administered 2019-03-29: 600 mg via INTRAVENOUS
  Filled 2019-03-29: qty 50

## 2019-03-29 MED ORDER — VANCOMYCIN HCL 10 G IV SOLR
1250.0000 mg | INTRAVENOUS | Status: DC
  Administered 2019-03-30 – 2019-04-05 (×7): 1250 mg via INTRAVENOUS
  Filled 2019-03-29 (×8): qty 1250

## 2019-03-29 MED ORDER — LIDOCAINE 2% (20 MG/ML) 5 ML SYRINGE
INTRAMUSCULAR | Status: AC
  Filled 2019-03-29: qty 5

## 2019-03-29 MED ORDER — PHENYLEPHRINE 40 MCG/ML (10ML) SYRINGE FOR IV PUSH (FOR BLOOD PRESSURE SUPPORT)
PREFILLED_SYRINGE | INTRAVENOUS | Status: DC | PRN
  Administered 2019-03-29 (×2): 80 ug via INTRAVENOUS

## 2019-03-29 MED ORDER — PROMETHAZINE HCL 25 MG/ML IJ SOLN: 6.2500 mg | INTRAMUSCULAR | Status: DC | PRN

## 2019-03-29 MED ORDER — PIPERACILLIN-TAZOBACTAM 3.375 G IVPB
3.3750 g | Freq: Three times a day (TID) | INTRAVENOUS | Status: DC
  Administered 2019-03-29 – 2019-04-06 (×23): 3.375 g via INTRAVENOUS
  Filled 2019-03-29 (×25): qty 50

## 2019-03-29 MED ORDER — ALBUTEROL SULFATE (2.5 MG/3ML) 0.083% IN NEBU: 2.5000 mg | INHALATION_SOLUTION | Freq: Four times a day (QID) | RESPIRATORY_TRACT | Status: DC | PRN

## 2019-03-29 MED ORDER — LACTATED RINGERS IV SOLN
INTRAVENOUS | Status: DC
  Administered 2019-03-29: 16:00:00 via INTRAVENOUS

## 2019-03-29 MED ORDER — SODIUM CHLORIDE 0.9% FLUSH
3.0000 mL | Freq: Two times a day (BID) | INTRAVENOUS | Status: DC
  Administered 2019-03-31 – 2019-04-04 (×4): 3 mL via INTRAVENOUS

## 2019-03-29 MED ORDER — SUCCINYLCHOLINE CHLORIDE 20 MG/ML IJ SOLN
INTRAMUSCULAR | Status: DC | PRN
  Administered 2019-03-29: 120 mg via INTRAVENOUS

## 2019-03-29 MED ORDER — DEXAMETHASONE SODIUM PHOSPHATE 10 MG/ML IJ SOLN
INTRAMUSCULAR | Status: DC | PRN
  Administered 2019-03-29: 10 mg via INTRAVENOUS

## 2019-03-29 MED ORDER — ONDANSETRON HCL 4 MG/2ML IJ SOLN
INTRAMUSCULAR | Status: AC
  Filled 2019-03-29: qty 2

## 2019-03-29 MED ORDER — FENTANYL CITRATE (PF) 250 MCG/5ML IJ SOLN
INTRAMUSCULAR | Status: DC | PRN
  Administered 2019-03-29 (×5): 50 ug via INTRAVENOUS

## 2019-03-29 MED ORDER — 0.9 % SODIUM CHLORIDE (POUR BTL) OPTIME
TOPICAL | Status: DC | PRN
  Administered 2019-03-29: 4000 mL

## 2019-03-29 MED ORDER — PHENYLEPHRINE 40 MCG/ML (10ML) SYRINGE FOR IV PUSH (FOR BLOOD PRESSURE SUPPORT)
PREFILLED_SYRINGE | INTRAVENOUS | Status: AC
  Filled 2019-03-29: qty 10

## 2019-03-29 MED ORDER — SODIUM CHLORIDE 0.9 % IV BOLUS
500.0000 mL | Freq: Once | INTRAVENOUS | Status: AC
  Administered 2019-03-30: 500 mL via INTRAVENOUS

## 2019-03-29 SURGICAL SUPPLY — 33 items
BANDAGE ACE 4X5 VEL STRL LF (GAUZE/BANDAGES/DRESSINGS) IMPLANT
BANDAGE ACE 6X5 VEL STRL LF (GAUZE/BANDAGES/DRESSINGS) ×1 IMPLANT
BNDG CMPR MED 10X6 ELC LF (GAUZE/BANDAGES/DRESSINGS) ×1
BNDG ELASTIC 6X10 VLCR STRL LF (GAUZE/BANDAGES/DRESSINGS) ×1 IMPLANT
BNDG GAUZE ELAST 4 BULKY (GAUZE/BANDAGES/DRESSINGS) ×2 IMPLANT
CANISTER SUCT 3000ML PPV (MISCELLANEOUS) ×2 IMPLANT
COVER SURGICAL LIGHT HANDLE (MISCELLANEOUS) ×2 IMPLANT
COVER WAND RF STERILE (DRAPES) ×1 IMPLANT
ELECT REM PT RETURN 9FT ADLT (ELECTROSURGICAL) ×2
ELECTRODE REM PT RTRN 9FT ADLT (ELECTROSURGICAL) ×1 IMPLANT
GAUZE SPONGE 4X4 12PLY STRL (GAUZE/BANDAGES/DRESSINGS) ×2 IMPLANT
GAUZE SPONGE 4X4 12PLY STRL LF (GAUZE/BANDAGES/DRESSINGS) ×1 IMPLANT
GLOVE BIO SURGEON STRL SZ7.5 (GLOVE) ×2 IMPLANT
GLOVE BIOGEL PI IND STRL 6.5 (GLOVE) IMPLANT
GLOVE BIOGEL PI INDICATOR 6.5 (GLOVE) ×1
GOWN STRL REUS W/ TWL LRG LVL3 (GOWN DISPOSABLE) ×3 IMPLANT
GOWN STRL REUS W/TWL LRG LVL3 (GOWN DISPOSABLE) ×4
KIT BASIN OR (CUSTOM PROCEDURE TRAY) ×2 IMPLANT
KIT TURNOVER KIT B (KITS) ×2 IMPLANT
NS IRRIG 1000ML POUR BTL (IV SOLUTION) ×5 IMPLANT
PACK GENERAL/GYN (CUSTOM PROCEDURE TRAY) IMPLANT
PACK SURGICAL SETUP 50X90 (CUSTOM PROCEDURE TRAY) ×1 IMPLANT
PACK UNIVERSAL I (CUSTOM PROCEDURE TRAY) IMPLANT
PAD ABD 8X10 STRL (GAUZE/BANDAGES/DRESSINGS) ×1 IMPLANT
PAD ARMBOARD 7.5X6 YLW CONV (MISCELLANEOUS) ×4 IMPLANT
STAPLER VISISTAT 35W (STAPLE) IMPLANT
SUT ETHILON 3 0 PS 1 (SUTURE) IMPLANT
SUT VIC AB 2-0 CTX 36 (SUTURE) IMPLANT
SUT VIC AB 3-0 SH 27 (SUTURE)
SUT VIC AB 3-0 SH 27X BRD (SUTURE) IMPLANT
SUT VICRYL 4-0 PS2 18IN ABS (SUTURE) IMPLANT
TOWEL GREEN STERILE (TOWEL DISPOSABLE) ×3 IMPLANT
WATER STERILE IRR 1000ML POUR (IV SOLUTION) ×2 IMPLANT

## 2019-03-29 NOTE — Progress Notes (Addendum)
Pharmacy Antibiotic Note  Alisha [name] is a 58 y.o. female admitted on 03/29/2019 with AKA infection.  Pharmacy has been consulted for zosyn and vancomycin dosing.  Presenting with foul smelling, thin yellow drainage, and some granulation necrotic skin edges, now s/p I&D on 3/30. Received clindamycin on 3/30 at 1220. WBC 12, LA 1.4, afebrile. Scr 1.09 (CrCl 60 mL/min).  Plan: Zosyn 3.375g IV q8h (4 hour infusion). Vancomycin 1750 mg IV once then 1250 mg IV every 24 hours   Monitor renal fx, clinical pic, cx results, and vancomycin levels as appropriate  Height: 5\' 4"  (162.6 cm) Weight: 182 lb 1.6 oz (82.6 kg) IBW/kg (Calculated) : 54.7  Temp (24hrs), Avg:97.9 F (36.6 C), Min:97.7 F (36.5 C), Max:98.1 F (36.7 C)  Recent Labs  Lab 03/29/19 1200  WBC 12.0*  CREATININE 1.09*  LATICACIDVEN 1.4    Estimated Creatinine Clearance: 60 mL/min (A) (by C-G formula based on SCr of 1.09 mg/dL (H)).    Allergies  Allergen Reactions  . Propoxyphene N-Acetaminophen Anaphylaxis and Swelling  . Strawberry Extract Anaphylaxis and Shortness Of Breath    "I go into shock"  . Buspar [Buspirone] Swelling    "Swells my face"  . Grapefruit Extract Other (See Comments)    Interaction with medications she's taking  . Tape Itching    Patient says that it itches and swells with some adhesive dressings.    Antimicrobials this admission: Vancomycin 3/30 >>  Zosyn 3/30 >>  Clindamycin 3/30 x1  Dose adjustments this admission: N/A  Microbiology results: 3/30 Wound Cx: abundant GPR, GNR, GPC  Thank you for allowing pharmacy to be a part of this patient's care.  Antonietta Jewel, PharmD, [facility] Clinical Pharmacist  Pager: [phone] Phone: [phone] 03/29/2019 8:09 PM

## 2019-03-29 NOTE — Progress Notes (Signed)
Pt with hx of DVT and on lifelong anticoagulation (xarelto as outpatient).  Heparin per pharmacy to start 03/30/2019 at 0600 (no bolus) ordered.   Most likely will require further debridement during this hospitalization.   Wet to dry dressing changes bid to start 03/30/2019 ordered.  Vanc and zosyn per pharmacy ordered for left AKA wound infection.    Spoke with Dr. Myna Hidalgo who agreed to admit pt for medical management and vascular surgery will follow for her left AKA wound.  Appreciate hospitalist help with management of this pt.     [name], [facility] 03/29/2019 7:37 PM
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
## 2019-03-29 NOTE — Transfer of Care (Signed)
Immediate Anesthesia Transfer of Care Note  Patient: Alisha [name]  Procedure(s) Performed: IRRIGATION AND DEBRIDEMENT LEFT ABOVE KNEE AMPUTATION (Left )  Patient Location: PACU  Anesthesia Type:General  Level of Consciousness: drowsy, patient cooperative and responds to stimulation  Airway & Oxygen Therapy: Patient Spontanous Breathing and Patient connected to face mask oxygen  Post-op Assessment: Report given to RN and Post -op Vital signs reviewed and stable  Post vital signs: Reviewed and stable  Last Vitals:  Vitals Value Taken Time  BP 100/75 03/29/2019  8:04 PM  Temp    Pulse 82 03/29/2019  8:07 PM  Resp 11 03/29/2019  8:07 PM  SpO2 100 % 03/29/2019  8:07 PM  Vitals shown include unvalidated device data.  Last Pain:  Vitals:   03/29/19 1643  TempSrc:   PainSc: 6          Complications: No apparent anesthesia complications

## 2019-03-29 NOTE — ED Triage Notes (Addendum)
Pt brought in by ems for c.o infection  To the left leg AKA site ;  Pt oriented x4 ; yellow discharge noted to the area; pt drowsy due to  Klonopin from  this morning

## 2019-03-29 NOTE — Anesthesia Preprocedure Evaluation (Addendum)
Anesthesia Evaluation  Patient identified by MRN, date of birth, ID band Patient awake    Reviewed: Allergy & Precautions, NPO status , Patient's Chart, lab work & pertinent test results  Airway Mallampati: II  TM Distance: >3 FB Neck ROM: Full    Dental no notable dental hx.    Pulmonary asthma , Current Smoker,    Pulmonary exam normal breath sounds clear to auscultation       Cardiovascular hypertension, + Peripheral Vascular Disease  Normal cardiovascular exam Rhythm:Regular Rate:Normal     Neuro/Psych negative neurological ROS  negative psych ROS   GI/Hepatic negative GI ROS, Neg liver ROS,   Endo/Other  negative endocrine ROS  Renal/GU negative Renal ROS  negative genitourinary   Musculoskeletal negative musculoskeletal ROS (+)   Abdominal   Peds negative pediatric ROS (+)  Hematology negative hematology ROS (+)   Anesthesia Other Findings   Reproductive/Obstetrics negative OB ROS                             Anesthesia Physical Anesthesia Plan  ASA: III  Anesthesia Plan: General   Post-op Pain Management:    Induction: Intravenous and Rapid sequence  PONV Risk Score and Plan: 2 and Ondansetron, Dexamethasone and Treatment may vary due to age or medical condition  Airway Management Planned: Oral ETT  Additional Equipment:   Intra-op Plan:   Post-operative Plan: Extubation in OR  Informed Consent: I have reviewed the patients History and Physical, chart, labs and discussed the procedure including the risks, benefits and alternatives for the proposed anesthesia with the patient or authorized representative who has indicated his/her understanding and acceptance.     Dental advisory given  Plan Discussed with: CRNA and Surgeon  Anesthesia Plan Comments: (Solids at 0900)       Anesthesia Quick Evaluation

## 2019-03-29 NOTE — Anesthesia Postprocedure Evaluation (Signed)
Anesthesia Post Note  Patient: Alisha [name]  Procedure(s) Performed: IRRIGATION AND DEBRIDEMENT LEFT ABOVE KNEE AMPUTATION (Left )     Patient location during evaluation: PACU Anesthesia Type: General Level of consciousness: awake Pain management: pain level controlled Vital Signs Assessment: post-procedure vital signs reviewed and stable Respiratory status: spontaneous breathing Cardiovascular status: stable Postop Assessment: no apparent nausea or vomiting Anesthetic complications: no    Last Vitals:  Vitals:   03/29/19 2030 03/29/19 2038  BP: 120/70   Pulse: 84   Resp: 19   Temp:    SpO2: 100% 100%    Last Pain:  Vitals:   03/29/19 2030  TempSrc:   PainSc: Asleep    LLE Motor Response: Purposeful movement (03/29/19 2030) LLE Sensation: Full sensation (03/29/19 2030)          Alisha [name]

## 2019-03-29 NOTE — H&P (Signed)
History and Physical    Alisha [name] HRC:[id number] DOB: 09/21/1961 DOA: 03/29/2019  PCP: Nolene Ebbs, MD   Patient coming from: Nursing facility   Chief Complaint: Left AKA stump with pain and drainage   HPI: Alisha [name] is a 58 y.o. 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  Patient reports severe pain involving the left leg in the ED. patient had fallen 1 week ago at her nursing facility when attempting to get out of her wheelchair with the wheels unlocked, fell onto the AKA stump, and a couple staples came out per report of nursing facility personnel.  She was seen in the emergency department after this fall, the site appeared to be intact per documentation, she was given a small fluid bolus for low blood pressure, and returned to her nursing facility.  Since that time, there is been increasing pain and increasingly purulent and malodorous drainage.  ED Course: Upon arrival to the ED, patient is found to be afebrile, saturating well on room air, hypotensive initially with blood pressure 86/65, and normal heart rate and respirations.  EKG features a sinus rhythm.  Chemistry panel is unremarkable and CBC notable for leukocytosis to 12,000.  Lactic acid is reassuringly normal.  Patient was given 500 cc normal saline, fentanyl, vancomycin, Zosyn, and clindamycin in the ED.  Vascular surgery was consulted and is taking the patient to the OR for debridement and washout, anticipates need for additional debridements during this hospitalization, and recommends admission to the medical service.  Review of Systems:  All other systems reviewed and apart from HPI, are negative.  Past Medical History:  Diagnosis Date  . Anxiety   . Asthma    . Bipolar disorder ([place])   . Breast lump 02/2008   Biopsy 05/2008: showed no evidence of malignancy  . Breast mass in female    bi lat   . CHF (congestive heart failure) ([place])   . Chronic pain syndrome   . Degenerative joint disease of knee   . Dental caries   . Depression   . 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 Endometrial mass 10/12/2012   Endometrium, biopsy on 11/04/12 - PROLIFERATIVE ENDOMETRIUM AND ABUNDANT MUCUS. NO HYPERPLASIA OR CARCINOMA.   . Fibromyalgia   . Hyperlipidemia   . Hypertension   . Irregular menses 9/08  . Joint pain   . Mixed stress and urge urinary incontinence    Being [facility] by [facility], underwent cystoscopy & uroflowmetry   . Pulmonary edema   . PVD (peripheral vascular disease) ([place])    s/p L fem-pop bypass 11/21/08; graft occluded 12/28/08;  aortogram w/ bilat LE runoff: 1.  bilat diffuse SFA occlusive dz, 2.  Mod to severe above-knee popliteal dz,  3.  Bilat 3-vessel runoff w/ mild tibial occlusive dz.  . Restless leg syndrome   . Sigmoid diverticulitis 10/26/2012  . Tobacco abuse   . Wears glasses     Past Surgical History:  Procedure Laterality Date  .  Right external iliac artery stent  11/04/2008  . ABDOMINAL AORTAGRAM     11/04/2008; 10/14/2006  . AMPUTATION Left 02/16/2019   Procedure: AMPUTATION ABOVE KNEE;  Surgeon: Angelia Mould, MD;  Location: [facility];  Service:  Vascular;  Laterality: Left;  . BREAST LUMPECTOMY W/ NEEDLE LOCALIZATION Right 06/13/2008  . BREAST LUMPECTOMY WITH RADIOACTIVE SEED LOCALIZATION Bilateral 05/03/2014   Procedure: BREAST LUMPECTOMY WITH RADIOACTIVE SEED LOCALIZATION;  Surgeon: Joyice Faster. Cornett, MD;  Location: [facility];  Service: General;  Laterality: Bilateral;  . COLONOSCOPY WITH PROPOFOL  01/25/2014  . FEMORAL ENDARTERECTOMY Left 11/21/2008  . FEMORAL-POPLITEAL BYPASS GRAFT Left 11/21/2008  . LEFT HEART CATHETERIZATION WITH  CORONARY ANGIOGRAM N/A 02/25/2014   Procedure: LEFT HEART CATHETERIZATION WITH CORONARY ANGIOGRAM;  Surgeon: Peter M Martinique, MD;  Location: [facility] CATH LAB;  Service: Cardiovascular;  Laterality: N/A;  . TONSILLECTOMY    . TUBAL LIGATION       reports that she has been smoking cigarettes. She has a 9.00 pack-year smoking history. She has never used smokeless tobacco. She reports previous alcohol use. She reports that she does not use drugs.  Allergies  Allergen Reactions  . Propoxyphene N-Acetaminophen Anaphylaxis and Swelling  . Strawberry Extract Anaphylaxis and Shortness Of Breath    "I go into shock"  . Buspar [Buspirone] Swelling    "Swells my face"  . Grapefruit Extract Other (See Comments)    Interaction with medications she's taking  . Tape Itching    Patient says that it itches and swells with some adhesive dressings.    Family History  Problem Relation Age of Onset  . Breast cancer Mother   . Depression Mother   . Hyperlipidemia Mother   . Mental retardation Mother   . Hyperlipidemia Father   . Mental retardation Father   . Breast cancer Other        GREAT AUNT  . Depression Sister   . Hyperlipidemia Sister   . Mental retardation Sister   . Mental retardation Daughter      Prior to Admission medications   Medication Sig Start Date End Date Taking? Authorizing Provider  albuterol (VENTOLIN HFA) 108 (90 Base) MCG/ACT inhaler Inhale 1-2 puffs into the lungs every 6 (six) hours as needed for wheezing or shortness of breath. 02/18/18   Tawny Asal, MD  aspirin EC 81 MG tablet Take 81 mg by mouth daily.    [provider]  atorvastatin (LIPITOR) 40 MG tablet Take 1 tablet (40 mg total) by mouth at bedtime. Patient taking differently: Take 40 mg by mouth daily.  02/17/18   Tawny Asal, MD  carvedilol (COREG) 12.5 MG tablet Take 1 tablet (12.5 mg total) by mouth 2 (two) times daily with a meal. 02/17/18   Tawny Asal, MD  diazepam (VALIUM) 5 MG tablet Take 5 mg  by mouth 3 (three) times daily.     [provider]  DULoxetine (CYMBALTA) 60 MG capsule Take 60 mg by mouth 2 (two) times daily.     [provider]  escitalopram (LEXAPRO) 10 MG tablet Take 10 mg by mouth daily.    [provider]  gabapentin (NEURONTIN) 800 MG tablet Take 800 mg by mouth 4 (four) times daily.    [provider]  hydrochlorothiazide (HYDRODIURIL) 25 MG tablet Take 1 tablet (25 mg total) by mouth daily. 02/17/18   Tawny Asal, MD  hydrOXYzine (ATARAX/VISTARIL) 50 MG tablet Take 50 mg by mouth See admin instructions. Take 50 mg by mouth at bedtime and an additional 50 mg two times a day as needed for anxiety    [provider]  losartan (COZAAR) 100 MG tablet Take 1 tablet (100 mg total) by mouth daily. 01/06/19 01/01/20  Nahser,  Wonda Cheng, MD  Lurasidone HCl (LATUDA) 120 MG TABS Take 80 mg by mouth 2 (two) times daily.     [provider]  oxyCODONE-acetaminophen (PERCOCET/ROXICET) 5-325 MG tablet Take 1 tablet by mouth every 4 (four) hours as needed for moderate pain. 02/19/19   Ulyses Amor, PA-C  potassium chloride (K-DUR) 10 MEQ tablet Take 10 mEq by mouth daily. 10/19/18   [provider]  vitamin E (VITAMIN E) 400 UNIT capsule Take 800 Units by mouth 2 (two) times daily.    [provider]  XARELTO 10 MG TABS tablet Take 10 mg by mouth daily. 10/19/18   [provider]    Physical Exam: Vitals:   03/29/19 1345 03/29/19 1415 03/29/19 1438 03/29/19 1500  BP: 105/72 100/63 119/78 121/75  Pulse: 76 76 70 79  Resp: 13 16 19 12   Temp:      TempSrc:      SpO2: 99% 99% 98% 100%  Weight:      Height:         Constitutional: NAD, somnolent Eyes: PERTLA, lids and conjunctivae normal ENMT: Mucous membranes are moist. Posterior pharynx clear of any exudate or lesions.   Neck: normal, supple, no masses, no thyromegaly Respiratory: No wheezing, no crackles. No accessory muscle use.   Cardiovascular: S1 & S2 heard, regular rate and rhythm. No extremity edema.   Abdomen: No distension, no tenderness, soft. Bowel sounds active.  Musculoskeletal: no clubbing / cyanosis. Status-post left AKA with stump wrapped.  Skin: no significant rashes, lesions, ulcers. Poor turgor. Neurologic: No facial asymmetry. Right patellar DTR intact. Moving all extremities.  Psychiatric: Somnolent in PACU; opens eyes to voice.    Labs on Admission: I have personally reviewed following labs and imaging studies  CBC: Recent Labs  Lab 03/29/19 1200  WBC 12.0*  NEUTROABS 7.9*  HGB 11.3*  HCT 37.1  MCV 99.7  PLT 660   Basic Metabolic Panel: Recent Labs  Lab 03/29/19 1200  NA 137  K 4.3  CL 103  CO2 23  GLUCOSE 100*  BUN 21*  CREATININE 1.09*  CALCIUM 9.2   GFR: Estimated Creatinine Clearance: 60 mL/min (A) (by C-G formula based on SCr of 1.09 mg/dL (H)). Liver Function Tests: Recent Labs  Lab 03/29/19 1200  AST 18  ALT 15  ALKPHOS 68  BILITOT 0.8  PROT 7.9  ALBUMIN 3.1*   No results for input(s): LIPASE, AMYLASE in the last 168 hours. No results for input(s): AMMONIA in the last 168 hours. Coagulation Profile: No results for input(s): INR, PROTIME in the last 168 hours. Cardiac Enzymes: No results for input(s): CKTOTAL, CKMB, CKMBINDEX, TROPONINI in the last 168 hours. BNP (last 3 results) No results for input(s): PROBNP in the last 8760 hours. HbA1C: No results for input(s): HGBA1C in the last 72 hours. CBG: No results for input(s): GLUCAP in the last 168 hours. Lipid Profile: No results for input(s): CHOL, HDL, LDLCALC, TRIG, CHOLHDL, LDLDIRECT in the last 72 hours. Thyroid Function Tests: No results for input(s): TSH, T4TOTAL, FREET4, T3FREE, THYROIDAB in the last 72 hours. Anemia Panel: No results for input(s): VITAMINB12, FOLATE, FERRITIN, TIBC, IRON, RETICCTPCT in the last 72 hours. Urine analysis:    Component Value Date/Time   COLORURINE YELLOW  02/15/2019 1900   APPEARANCEUR HAZY (A) 02/15/2019 1900   LABSPEC >1.046 (H) 02/15/2019 1900   PHURINE 5.0 02/15/2019 1900   GLUCOSEU NEGATIVE 02/15/2019 1900   HGBUR NEGATIVE 02/15/2019 1900   BILIRUBINUR NEGATIVE 02/15/2019 1900  KETONESUR NEGATIVE 02/15/2019 1900   PROTEINUR 30 (A) 02/15/2019 1900   UROBILINOGEN 1.0 02/06/2015 1159   NITRITE NEGATIVE 02/15/2019 1900   LEUKOCYTESUR NEGATIVE 02/15/2019 1900   Sepsis Labs: @LABRCNTIP (procalcitonin:4,lacticidven:4) ) Recent Results (from the past 240 hour(s))  Wound or Superficial Culture     Status: None (Preliminary result)   Collection Time: 03/29/19 12:12 PM  Result Value Ref Range Status   Specimen Description WOUND LEFT LEG  Final   Special Requests NONE  Final   Gram Stain   Final    RARE WBC PRESENT, PREDOMINANTLY PMN ABUNDANT GRAM POSITIVE COCCI ABUNDANT GRAM NEGATIVE RODS ABUNDANT GRAM POSITIVE RODS Performed at [facility], [place] [address] [place], [place] [address]    Culture PENDING  Incomplete   Report Status PENDING  Incomplete     Radiological Exams on Admission: No results found.  EKG: Independently reviewed. Sinus rhythm.   Assessment/Plan   1. Left AKA stump infection; PAD   - Patient with PAD and ischemic left foot, underwent left AKA on 02/16/19, now presenting with pain at the site and malodorous drainage  - She is afebrile in ED with leukocytosis, normal lactate, initial BP low and normalized with 500 cc bolus  - Vascular surgery is consulting and much appreciated, anticipating need for multiple debridements this admission  - Continue vancomycin and Zosyn, wet to dry dressing changes starting 3/31   2. DVT  - Managed with life-long Xarelto, held on admission with vascular surgery planning to start IV heparin am of 3/31   3. Chronic combined systolic & diastolic CHF  - EF 84-69% in February 2019 with diffuse HK, grade 1 diastolic dysfunction, mild MR, and mild TR  - She was given 500 cc  NS in ED for low BP and another 500 cc NS in PACU for same with SBP now improved to 120   - SLIV for now, use small boluses as needed for BP, follow daily wt and strict I/O's    4. Hypertension  - BP 85/65 on arrival to ED, normalized with 500 cc NS bolus; she received another 500 cc bolus in PACU for SBP 89 and responded well  - Hold antihypertensives initially   5. Depression, anxiety  - Continue medications as appropriate pending pharmacy med-rec    6. Tobacco abuse  - Too drowsy for counseling in PACU  - Nicotine patch will be offered    DVT prophylaxis: Xarelto held on admission, vascular surgery planning to start IV heparin 3/31 Code Status: Full  Family Communication: Discussed with patient  Consults called: Vascular surgery  Admission status: Inpatient; patient is chronically-ill with low EF and PAD s/p recent AKA, and will require inpatient management for infected left AKA stump and hypotension. Surgeon anticipates need for multiple debridements during this admission.    Vianne Bulls, MD [facility] Pager [phone]  If 7PM-7AM, please contact night-coverage [email or web address] Password [facility]  03/29/2019, 7:42 PM

## 2019-03-29 NOTE — Progress Notes (Addendum)
ANTICOAGULATION CONSULT NOTE - Initial Consult  Pharmacy Consult for heparin Indication: VTE treatment  Allergies  Allergen Reactions  . Propoxyphene N-Acetaminophen Anaphylaxis and Swelling  . Strawberry Extract Anaphylaxis and Shortness Of Breath    "I go into shock"  . Buspar [Buspirone] Swelling    "Swells my face"  . Grapefruit Extract Other (See Comments)    Interaction with medications she's taking  . Tape Itching    Patient says that it itches and swells with some adhesive dressings.    Patient Measurements: Height: 5\' 4"  (162.6 cm) Weight: 182 lb 1.6 oz (82.6 kg) IBW/kg (Calculated) : 54.7 Heparin Dosing Weight: 72.6 kg  Vital Signs: Temp: 98.1 F (36.7 C) (03/30 1135) Temp Source: Oral (03/30 1135) BP: 121/75 (03/30 1500) Pulse Rate: 79 (03/30 1500)  Labs: Recent Labs    03/29/19 1200  HGB 11.3*  HCT 37.1  PLT 380  CREATININE 1.09*    Estimated Creatinine Clearance: 60 mL/min (A) (by C-G formula based on SCr of 1.09 mg/dL (H)).   Medical History: Past Medical History:  Diagnosis Date  . Anxiety   . Asthma   . Bipolar disorder ([place])   . Breast lump 02/2008   Biopsy 05/2008: showed no evidence of malignancy  . Breast mass in female    bi lat   . CHF (congestive heart failure) ([place])   . Chronic pain syndrome   . Degenerative joint disease of knee   . Dental caries   . Depression   . 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 Endometrial mass 10/12/2012   Endometrium, biopsy on 11/04/12 - PROLIFERATIVE ENDOMETRIUM AND ABUNDANT MUCUS. NO HYPERPLASIA OR CARCINOMA.   . Fibromyalgia   . Hyperlipidemia   . Hypertension   . Irregular menses 9/08  . Joint pain   . Mixed stress and urge urinary incontinence    [facility], underwent cystoscopy & uroflowmetry   . Pulmonary edema   . PVD (peripheral vascular disease) ([place])    s/p L fem-pop bypass 11/21/08; graft occluded 12/28/08;   aortogram w/ bilat LE runoff: 1.  bilat diffuse SFA occlusive dz, 2.  Mod to severe above-knee popliteal dz,  3.  Bilat 3-vessel runoff w/ mild tibial occlusive dz.  . Restless leg syndrome   . Sigmoid diverticulitis 10/26/2012  . Tobacco abuse   . Wears glasses     Assessment: 25 yof with hx of L AKA, presenting with foul smelling, thin yellow drainage, and some granulation necrotic skin edges. On Xarelto PTA - LD was ~3 weeks ago (had ran out of prescription).   Hgb 11.3, plt 380. No s/sx of bleeding.   Goal of Therapy:  Heparin level 0.3-0.7 units/ml aPTT 66-102 seconds Monitor platelets by anticoagulation protocol: Yes   Plan: Start heparin infusion at 1150 units/hr on 3/31 at 0600 Check anti-Xa level in 6 hours and daily while on heparin Continue to monitor H&H and platelets  Antonietta Jewel, PharmD, [place] Clinical Pharmacist  Pager: [phone] Phone: [phone] 03/29/2019,8:04 PM

## 2019-03-29 NOTE — ED Provider Notes (Signed)
[facility] EMERGENCY DEPARTMENT Provider Note   CSN: [id number] Arrival date & time: 03/29/19  1121    History   Chief Complaint Chief Complaint  Patient presents with  . Wound Infection    HPI Alisha [name] is a 58 y.o. female.     HPI Patient presents from nursing facility with staff concern for wound dehiscence and/or infection. Patient has multiple medical issues, most relevantly, had a left above-the-knee amputation about 1 month ago. She is unsure when her staples were supposed to come out, states that she has been in her usual state of health.  On report the patient has developed purulent discharge from the wound, and staples are still in place. Patient notes that she did have a fall a few weeks ago, but beyond that her recovery has been unremarkable. Reportedly patient has been taking all medication as directed. Patient is sleeping, states that she received Klonopin prior to transfer. She does complain of pain in the left leg, described as severe.  EMS reports the patient was sleepy in route, hypotensive, but not febrile. EMS providers are familiar with the patient, notes that she is typically hypotensive.  Past Medical History:  Diagnosis Date  . Anxiety   . Asthma   . Bipolar disorder ([place])   . Breast lump 02/2008   Biopsy 05/2008: showed no evidence of malignancy  . Breast mass in female    bi lat   . CHF (congestive heart failure) ([place])   . Chronic pain syndrome   . Degenerative joint disease of knee   . Dental caries   . Depression   . 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 Endometrial mass 10/12/2012   Endometrium, biopsy on 11/04/12 - PROLIFERATIVE ENDOMETRIUM AND ABUNDANT MUCUS. NO HYPERPLASIA OR CARCINOMA.   . Fibromyalgia   . Hyperlipidemia   . Hypertension   . Irregular menses 9/08  . Joint pain   . Mixed stress and urge urinary incontinence    [facility], underwent cystoscopy & uroflowmetry   . Pulmonary edema   . PVD (peripheral vascular disease) ([place])    s/p L fem-pop bypass 11/21/08; graft occluded 12/28/08;  aortogram w/ bilat LE runoff: 1.  bilat diffuse SFA occlusive dz, 2.  Mod to severe above-knee popliteal dz,  3.  Bilat 3-vessel runoff w/ mild tibial occlusive dz.  . Restless leg syndrome   . Sigmoid diverticulitis 10/26/2012  . Tobacco abuse   . Wears glasses     Patient Active Problem List   Diagnosis Date Noted  . Gangrene of left foot ([place]) 02/14/2019  . Loss of consciousness ([place]) 02/15/2018  . Asthma 10/07/2016  . Colonic diverticular abscess   . Anxiety 08/28/2014  . Breast pain, left 08/11/2014  . Abnormal ECG 01/28/2014  . Health care maintenance 09/16/2013  . Metrorrhagia 10/12/2012  . Endometrial mass 10/12/2012  . 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 Osteoarthritis of both knees 02/15/2012  . Chronic pain syndrome 07/05/2010  . Venous thromboembolism 02/22/2010  . Depression 07/17/2009  . 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 TOBACCO ABUSE 10/20/2006  . Essential hypertension 10/20/2006  . Peripheral vascular disease ([place]) 10/20/2006  . HYPERLIPIDEMIA, MIXED 10/10/2006    Past Surgical History:  Procedure Laterality Date  .  Right external iliac artery stent  11/04/2008  . ABDOMINAL AORTAGRAM     11/04/2008; 10/14/2006  . AMPUTATION Left 02/16/2019  Procedure: AMPUTATION ABOVE KNEE;  Surgeon: Angelia Mould, MD;  Location: [facility];  Service: Vascular;  Laterality: Left;  . BREAST LUMPECTOMY W/ NEEDLE LOCALIZATION Right 06/13/2008  . BREAST LUMPECTOMY WITH RADIOACTIVE SEED LOCALIZATION Bilateral 05/03/2014   Procedure: BREAST LUMPECTOMY WITH RADIOACTIVE SEED LOCALIZATION;  Surgeon: Joyice Faster. Cornett, MD;  Location: [facility];  Service: General;  Laterality: Bilateral;  . COLONOSCOPY WITH PROPOFOL  01/25/2014  . FEMORAL ENDARTERECTOMY Left  11/21/2008  . FEMORAL-POPLITEAL BYPASS GRAFT Left 11/21/2008  . LEFT HEART CATHETERIZATION WITH CORONARY ANGIOGRAM N/A 02/25/2014   Procedure: LEFT HEART CATHETERIZATION WITH CORONARY ANGIOGRAM;  Surgeon: Peter M Martinique, MD;  Location: [facility] CATH LAB;  Service: Cardiovascular;  Laterality: N/A;  . TONSILLECTOMY    . TUBAL LIGATION       OB History    Gravida  6   Para  4   Term  4   Preterm      AB  2   Living  4     SAB  1   TAB  1   Ectopic      Multiple      Live Births               Home Medications    Prior to Admission medications   Medication Sig Start Date End Date Taking? Authorizing Provider  albuterol (VENTOLIN HFA) 108 (90 Base) MCG/ACT inhaler Inhale 1-2 puffs into the lungs every 6 (six) hours as needed for wheezing or shortness of breath. 02/18/18   Tawny Asal, MD  aspirin EC 81 MG tablet Take 81 mg by mouth daily.    [provider]  atorvastatin (LIPITOR) 40 MG tablet Take 1 tablet (40 mg total) by mouth at bedtime. Patient taking differently: Take 40 mg by mouth daily.  02/17/18   Tawny Asal, MD  carvedilol (COREG) 12.5 MG tablet Take 1 tablet (12.5 mg total) by mouth 2 (two) times daily with a meal. 02/17/18   Tawny Asal, MD  diazepam (VALIUM) 5 MG tablet Take 5 mg by mouth 3 (three) times daily.     [provider]  DULoxetine (CYMBALTA) 60 MG capsule Take 60 mg by mouth 2 (two) times daily.     [provider]  escitalopram (LEXAPRO) 10 MG tablet Take 10 mg by mouth daily.    [provider]  gabapentin (NEURONTIN) 800 MG tablet Take 800 mg by mouth 4 (four) times daily.    [provider]  hydrochlorothiazide (HYDRODIURIL) 25 MG tablet Take 1 tablet (25 mg total) by mouth daily. 02/17/18   Tawny Asal, MD  hydrOXYzine (ATARAX/VISTARIL) 50 MG tablet Take 50 mg by mouth See admin instructions. Take 50 mg by mouth at bedtime and an additional 50 mg two times a day as needed for anxiety    [provider]  losartan (COZAAR) 100 MG tablet Take 1 tablet (100 mg total) by mouth daily. 01/06/19 01/01/20  Nahser, Wonda Cheng, MD  Lurasidone HCl (LATUDA) 120 MG TABS Take 80 mg by mouth 2 (two) times daily.     [provider]  oxyCODONE-acetaminophen (PERCOCET/ROXICET) 5-325 MG tablet Take 1 tablet by mouth every 4 (four) hours as needed for moderate pain. 02/19/19   Ulyses Amor, PA-C  potassium chloride (K-DUR) 10 MEQ tablet Take 10 mEq by mouth daily. 10/19/18   [provider]  vitamin E (VITAMIN E) 400 UNIT capsule Take 800 Units by mouth 2 (two) times daily.  [provider]  XARELTO 10 MG TABS tablet Take 10 mg by mouth daily. 10/19/18   [provider]    Family History Family History  Problem Relation Age of Onset  . Breast cancer Mother   . Depression Mother   . Hyperlipidemia Mother   . Mental retardation Mother   . Hyperlipidemia Father   . Mental retardation Father   . Breast cancer Other        GREAT AUNT  . Depression Sister   . Hyperlipidemia Sister   . Mental retardation Sister   . Mental retardation Daughter     Social History Social History   Tobacco Use  . Smoking status: Current Every Day Smoker    Packs/day: 0.50    Years: 18.00    Pack years: 9.00    Types: Cigarettes  . Smokeless tobacco: Never Used  . Tobacco comment: 1pk 1/2 PACK DAY 2-3 0r none  Substance Use Topics  . Alcohol use: Not Currently    Alcohol/week: 0.0 standard drinks  . Drug use: No    Comment: hx of marijuana and cocaine use; uses Suboxone patch     Allergies   Propoxyphene n-acetaminophen; Strawberry extract; Buspar [buspirone]; and Grapefruit extract   Review of Systems Review of Systems  Constitutional:       Per HPI, otherwise negative  HENT:       Per HPI, otherwise negative  Respiratory:       Per HPI, otherwise negative  Cardiovascular:       Per HPI, otherwise negative  Gastrointestinal: Negative for vomiting.   Endocrine:       Negative aside from HPI  Genitourinary:       Neg aside from HPI   Musculoskeletal:       Per HPI, otherwise negative  Skin: Positive for wound.  Neurological: Positive for weakness. Negative for syncope.     Physical Exam Updated Vital Signs BP (!) 88/63   Pulse 80   Temp 98.1 F (36.7 C) (Oral)   Resp 16   Ht 5\' 4"  (1.626 m)   Wt 82.6 kg   LMP 11/22/2015   SpO2 93%   BMI 31.26 kg/m   Physical Exam Vitals signs and nursing note reviewed.  Constitutional:      General: She is not in acute distress.    Appearance: She is well-developed.  HENT:     Head: Normocephalic and atraumatic.  Eyes:     Conjunctiva/sclera: Conjunctivae normal.  Cardiovascular:     Rate and Rhythm: Normal rate and regular rhythm.  Pulmonary:     Effort: Pulmonary effort is normal. No respiratory distress.     Breath sounds: Normal breath sounds. No stridor.  Abdominal:     General: There is no distension.  Musculoskeletal:       Legs:  Skin:    General: Skin is warm and dry.  Neurological:     Mental Status: She is alert and oriented to person, place, and time.     Cranial Nerves: No cranial nerve deficit.     Motor: Atrophy present.  Psychiatric:        Behavior: Behavior is slowed and withdrawn.      ED Treatments / Results  Labs (all labs ordered are listed, but only abnormal results are displayed) Labs Reviewed  AEROBIC CULTURE (SUPERFICIAL SPECIMEN)  COMPREHENSIVE METABOLIC PANEL  LACTIC ACID, PLASMA  LACTIC ACID, PLASMA  CBC WITH DIFFERENTIAL/PLATELET    EKG None  Radiology No results  found.  Procedures Procedures (including critical care time)  Medications Ordered in ED Medications  clindamycin (CLEOCIN) IVPB 600 mg (600 mg Intravenous New Bag/Given 03/29/19 1222)  sodium chloride 0.9 % bolus 500 mL (500 mLs Intravenous New Bag/Given 03/29/19 1221)     Initial Impression / Assessment and Plan / ED Course  I have reviewed the triage vital  signs and the nursing notes.  Pertinent labs & imaging results that were available during my care of the patient were reviewed by me and considered in my medical decision making (see chart for details).    Chart review after the initial evaluation notable for fall 2 weeks ago, which was 2 weeks after her amputation procedure, performed by vascular surgery here.   Update:, Patient in similar condition, though more awake after provision of fluids. Blood pressure has improved as well. Patient received clindamycin empirically for concern of wound infection.  Further chart review, it seems as though the patient had fall almost 1 month after her initial amputation, and is now been in a nursing facility.   Patient continues to improve on repeat exam, with improved mentation, blood pressure now normal. Labs notable for leukocytosis, with increased absolute immature granulocyte count, concerning for infection. I discussed the patient's case with our vascular surgery colleagues, and Dr. Oneida Alar has seen the patient, is preparing to take her to the operating room for cleanout of her dehisced, possibly infected amputation site.  Patient presents about 6 weeks following amputation, with concern for dehisced, infected surgical site. Patient is initially hypotensive, and with concern for infection she received empiric antibiotics, fluids. Patient's clinical condition improved substantially, though with concern for infection of the surgical site she required transfer to the operating room for cleanout after my evaluation.  Final Clinical Impressions(s) / ED Diagnoses   Final diagnoses:  Wound infection  hypotension  CRITICAL CARE Performed by: Carmin Muskrat Total critical care time: 35 minutes Critical care time was exclusive of separately billable procedures and treating other patients. Critical care was necessary to treat or prevent imminent or life-threatening deterioration. Critical care was  time spent personally by me on the following activities: development of treatment plan with patient and/or surrogate as well as nursing, discussions with consultants, evaluation of patient's response to treatment, examination of patient, obtaining history from patient or surrogate, ordering and performing treatments and interventions, ordering and review of laboratory studies, ordering and review of radiographic studies, pulse oximetry and re-evaluation of patient's condition.   Carmin Muskrat, MD 03/30/19 [phone]

## 2019-03-29 NOTE — Op Note (Signed)
Procedure: Debridement of left above-knee amputation  Preoperative diagnosis: Poorly healing left above-knee amputation  Postoperative diagnosis: Same  Anesthesia: General  Assistant: Nurse  Operative findings: Necrotic fat and subcutaneous tissue  Operative details: After obtaining form consent, the patient taken the operating.  The patient was placed in supine position operating table.  After induction general anesthesia endotracheal ovation, patient's entire left lower extremity was prepped and draped in usual sterile fashion.  Pre-existing staples were removed.  There was a large amount of necrotic tissue on the medial aspect of the left above-knee amputation.  This was all sharply debrided away with a 10 blade.  This was back to good healthy bleeding tissue.  There was some subcu subcutaneous tissue and muscle and fascia debrided.  There was good granulation tissue on the lateral aspect of the wound.  The skin edges were also freshened up with a 10 blade.  Cautery was used to obtain hemostasis.  A wet-to-dry dressing was applied.  The patient tolerated procedure well and there were no complications.  The instrument sponge and needle count was correct the end of the case.  Patient was taken the recovery room in stable condition.  Ruta Hinds, MD [facility] Office: [phone] Pager: [phone]

## 2019-03-29 NOTE — Consult Note (Signed)
[facility]  Pt is POD 49 from left AKA.  She was a no show for her follow up appt last Thursday.  She has had foul smelling drainage  And pain in stump.   Pt still smoking   Objective (!) 104/59 77 98.1 F (36.7 C) (Oral) 18 100%  Intake/Output Summary (Last 24 hours) at 03/29/2019 1415 Last data filed at 03/29/2019 1252 Gross per 24 hour  Intake 50 ml  Output -  Net 50 ml   Left AkA with foul smell, thin yellow drainage, some granulation necrotic skin edges  Assessment/Planning: Poorly healing left AKA Will debride and washout in OR today Last ate 9a m  Keep NPO, consent  Ruta Hinds 03/29/2019 2:15 PM --  Laboratory Lab Results: Recent Labs    03/29/19 1200  WBC 12.0*  HGB 11.3*  HCT 37.1  PLT 380   BMET Recent Labs    03/29/19 1200  NA 137  K 4.3  CL 103  CO2 23  GLUCOSE 100*  BUN 21*  CREATININE 1.09*  CALCIUM 9.2    COAG Lab Results  Component Value Date   INR 1.07 02/14/2019   INR 0.95 02/15/2018   INR 1.05 11/04/2017   No results found for: PTT

## 2019-03-30 ENCOUNTER — Encounter (HOSPITAL_COMMUNITY): Payer: Self-pay | Admitting: Vascular Surgery

## 2019-03-30 LAB — CBC
HCT: 31.1 % — ABNORMAL LOW (ref 36.0–46.0)
Hemoglobin: 9.9 g/dL — ABNORMAL LOW (ref 12.0–15.0)
MCH: 31.2 pg (ref 26.0–34.0)
MCHC: 31.8 g/dL (ref 30.0–36.0)
MCV: 98.1 fL (ref 80.0–100.0)
Platelets: 313 10*3/uL (ref 150–400)
RBC: 3.17 MIL/uL — AB (ref 3.87–5.11)
RDW: 12.7 % (ref 11.5–15.5)
WBC: 10.3 10*3/uL (ref 4.0–10.5)
nRBC: 0 % (ref 0.0–0.2)

## 2019-03-30 LAB — BASIC METABOLIC PANEL
Anion gap: 8 (ref 5–15)
BUN: 20 mg/dL (ref 6–20)
CALCIUM: 8.5 mg/dL — AB (ref 8.9–10.3)
CO2: 25 mmol/L (ref 22–32)
Chloride: 104 mmol/L (ref 98–111)
Creatinine, Ser: 0.97 mg/dL (ref 0.44–1.00)
GFR calc Af Amer: >60 mL/min (ref >60)
GFR calc non Af Amer: >60 mL/min (ref >60)
Glucose, Bld: 137 mg/dL — ABNORMAL HIGH (ref 70–99)
Potassium: 4.4 mmol/L (ref 3.5–5.1)
Sodium: 137 mmol/L (ref 135–145)

## 2019-03-30 LAB — APTT
APTT: 42 s — AB (ref 24–36)
aPTT: 52 seconds — ABNORMAL HIGH (ref 24–36)

## 2019-03-30 LAB — GLUCOSE, CAPILLARY: Glucose-Capillary: 106 mg/dL — ABNORMAL HIGH (ref 70–99)

## 2019-03-30 LAB — HEPARIN LEVEL (UNFRACTIONATED): Heparin Unfractionated: 0.35 IU/mL (ref 0.30–0.70)

## 2019-03-30 MED ORDER — DIAZEPAM 5 MG PO TABS: 5.0000 mg | ORAL_TABLET | Freq: Three times a day (TID) | ORAL | Status: DC

## 2019-03-30 MED ORDER — HYDROCODONE-ACETAMINOPHEN 5-325 MG PO TABS
1.0000 | ORAL_TABLET | Freq: Four times a day (QID) | ORAL | Status: DC | PRN
  Administered 2019-03-30 – 2019-04-03 (×12): 1 via ORAL
  Filled 2019-03-30 (×12): qty 1

## 2019-03-30 MED ORDER — ATORVASTATIN CALCIUM 40 MG PO TABS
40.0000 mg | ORAL_TABLET | Freq: Every day | ORAL | Status: DC
  Administered 2019-03-30 – 2019-04-07 (×9): 40 mg via ORAL
  Filled 2019-03-30 (×9): qty 1

## 2019-03-30 MED ORDER — ASPIRIN EC 81 MG PO TBEC
81.0000 mg | DELAYED_RELEASE_TABLET | Freq: Every day | ORAL | Status: DC
  Administered 2019-03-30 – 2019-04-08 (×10): 81 mg via ORAL
  Filled 2019-03-30 (×10): qty 1

## 2019-03-30 MED ORDER — DIAZEPAM 2 MG PO TABS
2.0000 mg | ORAL_TABLET | Freq: Two times a day (BID) | ORAL | Status: DC | PRN
  Administered 2019-03-30 – 2019-04-07 (×9): 2 mg via ORAL
  Filled 2019-03-30 (×9): qty 1

## 2019-03-30 MED ORDER — HYDROXYZINE HCL 25 MG PO TABS
50.0000 mg | ORAL_TABLET | Freq: Every day | ORAL | Status: DC
  Administered 2019-03-30 – 2019-04-07 (×8): 50 mg via ORAL
  Filled 2019-03-30 (×10): qty 2

## 2019-03-30 MED ORDER — FENTANYL CITRATE (PF) 100 MCG/2ML IJ SOLN
25.0000 ug | Freq: Four times a day (QID) | INTRAMUSCULAR | Status: DC | PRN
  Administered 2019-03-30: 25 ug via INTRAVENOUS
  Administered 2019-03-31 – 2019-04-07 (×12): 50 ug via INTRAVENOUS
  Filled 2019-03-30 (×13): qty 2

## 2019-03-30 MED ORDER — HYDROXYZINE HCL 25 MG PO TABS
50.0000 mg | ORAL_TABLET | Freq: Two times a day (BID) | ORAL | Status: DC | PRN
  Administered 2019-04-01 – 2019-04-08 (×5): 50 mg via ORAL
  Filled 2019-03-30 (×4): qty 2

## 2019-03-30 MED ORDER — GABAPENTIN 400 MG PO CAPS
400.0000 mg | ORAL_CAPSULE | Freq: Two times a day (BID) | ORAL | Status: DC
  Administered 2019-03-30: 400 mg via ORAL
  Filled 2019-03-30: qty 1

## 2019-03-30 MED ORDER — GABAPENTIN 800 MG PO TABS: 800.0000 mg | ORAL_TABLET | Freq: Four times a day (QID) | ORAL | Status: DC

## 2019-03-30 MED ORDER — DULOXETINE HCL 60 MG PO CPEP
60.0000 mg | ORAL_CAPSULE | Freq: Two times a day (BID) | ORAL | Status: DC
  Administered 2019-03-31 – 2019-04-08 (×17): 60 mg via ORAL
  Filled 2019-03-30 (×18): qty 1

## 2019-03-30 NOTE — Progress Notes (Signed)
RN has witnessed pt several times today undressing her left stump and picking at the wound. RN explained to pt that the wound must be dressed and covered to prevent any more infection and that the dressing will be changed twice a day. Will continue to monitor.  Clyde Canterbury, RN

## 2019-03-30 NOTE — TOC Initial Note (Signed)
Transition of Care [facility]) - Initial/Assessment Note    Patient Details  Name: Alisha [name] MRN: [id number] Date of Birth: 01/22/1961  [facility] CM/SW Contact:    Vinie Sill, [place] Phone Number: 03/30/2019, 12:00 PM  Clinical Narrative:                 CSW spoke with the patient by phone. Patient was upset and crying while talking with CSW stating" they have cut off my leg, I am having to deal with that". Patient states she arrived from [facility] (ALF) but she is there only temporarily.  Patient expressed being upset with her daughters because they had her placed in a assisting living facility instead of their home. CSW asked if her daughters can be contacted and the patient became upset and states "I wished you all just leave them alone, all of my daughters " she does not want CSW to contact her family at this time. CSW will continue to follow and assist with discharge planing.   CSW called [facility] and confirmed patient is a resident there.  Expected Discharge Plan: Assisted Living Barriers to Discharge: Continued Medical Work up   Patient Goals and CMS Choice        Expected Discharge Plan and Services Expected Discharge Plan: Assisted Living       Living arrangements for the past 2 months: [place] Expected Discharge Date: 03/31/19                        Prior Living Arrangements/Services Living arrangements for the past 2 months: [place] Lives with:: Facility Resident Patient language and need for interpreter reviewed:: No        Need for Family Participation in Patient Care: Yes (Comment) Care giver support system in place?: Yes (comment)   Criminal Activity/Legal Involvement Pertinent to Current Situation/Hospitalization: No - Comment as needed  Activities of Daily Living   ADL Screening (condition at time of admission) Patient's cognitive ability adequate to safely complete  daily activities?: Yes Is the patient deaf or have difficulty hearing?: No Does the patient have difficulty concentrating, remembering, or making decisions?: No Patient able to express need for assistance with ADLs?: Yes Does the patient have difficulty dressing or bathing?: No Independently performs ADLs?: Yes (appropriate for developmental age) Does the patient have difficulty walking or climbing stairs?: Yes Weakness of Legs: None Weakness of Arms/Hands: None  Permission Sought/Granted Permission sought to share information with : Customer service manager                Emotional Assessment Appearance:: Other (Comment Required(unable to assess) Attitude/Demeanor/Rapport: Engaged, Crying, Complaining Affect (typically observed): Tearful/Crying Orientation: : Oriented to Self, Oriented to Place, Oriented to  Time, Oriented to Situation Alcohol / Substance Use: Not Applicable Psych Involvement: No (comment)  Admission diagnosis:  knee infection Patient Active Problem List   Diagnosis Date Noted  . 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 Chronic combined systolic and diastolic CHF (congestive heart failure) ([place]) 03/29/2019  . Gangrene of left foot ([place]) 02/14/2019  . Loss of consciousness ([place]) 02/15/2018  . Asthma 10/07/2016  . Colonic diverticular abscess   . Anxiety 08/28/2014  . Breast pain, left 08/11/2014  . Abnormal ECG 01/28/2014  . Health care maintenance 09/16/2013  . Metrorrhagia 10/12/2012  . Endometrial mass 10/12/2012  . 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  Osteoarthritis of both knees 02/15/2012  . Chronic pain syndrome 07/05/2010  . Venous thromboembolism 02/22/2010  . Depression with anxiety 07/17/2009  . 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 TOBACCO ABUSE 10/20/2006  . Essential hypertension 10/20/2006  . Peripheral vascular disease ([place]) 10/20/2006  . HYPERLIPIDEMIA, MIXED 10/10/2006    PCP:  Nolene Ebbs, MD Pharmacy:   [facility] [address], [place] [address] Phone: [phone] Fax: [place], [place] [address] Phone: [phone] Fax: [place], [place] [address] [place] [address] Phone: [phone] Fax: [phone]  [place], [place] [address] Phone: [phone] Fax: [place], [place] [address] [place] [address] Phone: [phone] Fax: [phone]     Social Determinants of Health (SDOH) Interventions    Readmission Risk Interventions No flowsheet data found.

## 2019-03-30 NOTE — Progress Notes (Signed)
   VASCULAR SURGERY ASSESSMENT & PLAN:   POD 1 S/P DEBRIDEMENT OF LEFT AKA: This patient had a left AKA approximately 7 weeks ago and presented with infection that was debrided yesterday.  She will need continued dressing changes and to potentially have her return to the operating room for placement of a VAC later in the week.  Continue aggressive wound care.  ANTICOAGULATION: This patient has been on long-term Xarelto because of a history of thromboembolic disease.  This is on hold.  She is on intravenous heparin.  ID: The patient is currently on intravenous Cleocin, Zosyn, and vancomycin.  The intraoperative culture is pending.  The Gram stain shows abundant gram-positive cocci, gram-negative rods, and gram-positive rods.  She is afebrile.  SUBJECTIVE:   No complaints this morning.  PHYSICAL EXAM:   Vitals:   03/30/19 0118 03/30/19 0500 03/30/19 0511 03/30/19 0806  BP: 105/74  111/69 119/66  Pulse: 81  83 82  Resp: 13  17 13   Temp: 97.9 F (36.6 C)  97.7 F (36.5 C) 97.9 F (36.6 C)  TempSrc: Oral  Oral Oral  SpO2: 100%  100% 100%  Weight:  84.8 kg    Height:       I changed her dressing this morning.  The wound is not ready for placement of a VAC.  Continue aggressive wound care.  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 aspirin EC  81 mg  Oral Daily  . atorvastatin  40 mg Oral QHS  . DULoxetine  60 mg Oral BID  . gabapentin  400 mg Oral BID  . hydrOXYzine  50 mg Oral See admin instructions  . sodium chloride flush  3 mL Intravenous Q12H  . sodium chloride flush  3 mL Intravenous Q12H    [name] Beeper: [phone] Office: [phone] 03/30/2019

## 2019-03-30 NOTE — Progress Notes (Signed)
[place] for Heparin Indication: VTE treatment  Allergies  Allergen Reactions  . Propoxyphene N-Acetaminophen Anaphylaxis and Swelling  . Strawberry Extract Anaphylaxis and Shortness Of Breath    "I go into shock"  . Buspar [Buspirone] Swelling    "Swells my face"  . Grapefruit Extract Other (See Comments)    Interaction with medications she's taking  . Tape Itching    Patient says that it itches and swells with some adhesive dressings.    Patient Measurements: Height: 5\' 4"  (162.6 cm) Weight: 186 lb 15.2 oz (84.8 kg) IBW/kg (Calculated) : 54.7 Heparin Dosing Weight: 72.6 kg  Vital Signs: Temp: 97.5 F (36.4 C) (03/31 2329) Temp Source: Oral (03/31 2329) BP: 132/79 (03/31 2329) Pulse Rate: 83 (03/31 2329)  Labs: Recent Labs    03/29/19 1200 03/29/19 2128 03/30/19 0323 03/30/19 1346 03/30/19 1450 03/30/19 2241  HGB 11.3*  --  9.9*  --   --   --   HCT 37.1  --  31.1*  --   --   --   PLT 380  --  313  --   --   --   APTT  --  33  --   --  52* 42*  HEPARINUNFRC  --  1.62*  --  0.35  --   --   CREATININE 1.09*  --  0.97  --   --   --     Estimated Creatinine Clearance: 68.2 mL/min (by C-G formula based on SCr of 0.97 mg/dL).   Medical History: Past Medical History:  Diagnosis Date  . Anxiety   . Asthma   . Bipolar disorder ([place])   . Breast lump 02/2008   Biopsy 05/2008: showed no evidence of malignancy  . Breast mass in female    bi lat   . CHF (congestive heart failure) ([place])   . Chronic pain syndrome   . Degenerative joint disease of knee   . Dental caries   . Depression   . 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 Endometrial mass 10/12/2012   Endometrium, biopsy on 11/04/12 - PROLIFERATIVE ENDOMETRIUM AND ABUNDANT MUCUS. NO HYPERPLASIA OR CARCINOMA.   . Fibromyalgia   . Hyperlipidemia   . Hypertension   . Irregular menses 9/08  . Joint pain   . Mixed stress  and urge urinary incontinence    [facility], underwent cystoscopy & uroflowmetry   . Pulmonary edema   . PVD (peripheral vascular disease) ([place])    s/p L fem-pop bypass 11/21/08; graft occluded 12/28/08;  aortogram w/ bilat LE runoff: 1.  bilat diffuse SFA occlusive dz, 2.  Mod to severe above-knee popliteal dz,  3.  Bilat 3-vessel runoff w/ mild tibial occlusive dz.  . Restless leg syndrome   . Sigmoid diverticulitis 10/26/2012  . Tobacco abuse   . Wears glasses     Assessment: 48 yof with hx of L AKA, presenting with foul smelling, thin yellow drainage, and some granulation necrotic skin edges. On Xarelto PTA. Heparin was started at 6am today. The baseline heparin level was elevated suggesting Xarelto influence but today's heparin level is loweer than expected -heparin level = 0.35, aPTT= 52  3/31 PM update: aPTT is low at 42, no issues per RN  Goal of Therapy:  Heparin level 0.3-0.7 units/ml aPTT 66-102 seconds Monitor platelets by anticoagulation protocol: Yes   Plan: -Inc heparin to 1500 units/hr -Re-check heparin  level and aPTT at 0800 -Should be able to start using heparin level only soon  Narda Bonds, PharmD, [place] Pharmacist Phone: [phone]

## 2019-03-30 NOTE — Progress Notes (Signed)
PROGRESS NOTE  Alisha [name] JQZ:[id number] DOB: 10/27/1961 DOA: 03/29/2019 PCP: Nolene Ebbs, MD  HPI/Recap of past 24 hours: Alisha [name] is a 58 y.o. 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  Patient reports severe pain involving the left leg in the ED. patient had fallen 1 week ago at her nursing facility when attempting to get out of her wheelchair with the wheels unlocked, fell onto the AKA stump, and a couple staples came out per report of nursing facility personnel.  She was seen in the emergency department after this fall, the site appeared to be intact per documentation, she was given a small fluid bolus for low blood pressure, and returned to her nursing facility.  Since that time, there is been increasing pain and increasingly purulent and malodorous drainage.  ED Course: Upon arrival to the ED, patient is found to be afebrile, saturating well on room air, hypotensive initially with blood pressure 86/65, and normal heart rate and respirations.  EKG features a sinus rhythm.  Chemistry panel is unremarkable and CBC notable for leukocytosis to 12,000.  Lactic acid is reassuringly normal.  Patient was given 500 cc normal saline, fentanyl, vancomycin, Zosyn, and clindamycin in the ED.  Vascular surgery was consulted and is taking the patient to the OR for debridement and washout, anticipates need for additional debridements during this hospitalization, and recommends admission to the medical service.  03/30/19: Patient seen and examined at her bedside.  No acute events overnight.  She had debridement done on 03/29/2019 by vascular surgery.  She denies any pain in her left stump.  She also denies chest pain, palpitations or dyspnea at  rest.  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) Active Problems:   TOBACCO ABUSE   Depression with anxiety   Peripheral vascular disease (HCC)   Venous thromboembolism   Chronic combined systolic and diastolic CHF (congestive heart failure) (HCC)  Left AKA stump infection complicated by peripheral artery disease Post debridement on 03/29/2019 by vascular surgery Continue IV vancomycin and IV Zosyn Wound culture revealed abundant gram-negative/gram-positive rods and gram-positive cocci Vascular surgery following  History of DVT on Xarelto Continue to hold off Xarelto Continue heparin drip until vascular surgery has completed all procedures Defer to vascular surgery to restart Xarelto Pharmacy managing heparin drip  Chronic combined systolic and diastolic CHF Last 2D echo done on in February 2019 revealed LVEF 35 to 40% with grade 1 diastolic dysfunction Continue strict I's and O's and daily weight  Severe peripheral vascular disease status post left above-the-knee amputation Continue aspirin, Lipitor  COPD No acute issues Continue COPD medications Maintain O2 saturation greater than 92%  Chronic tobacco use disorder She is attempting to quit on her own Nicotine patch as needed Tobacco cessation counseled on at bedside  Chronic anxiety and depression Resume home medications  Hypertension Blood pressure is normotensive Continue to hold off antihypertensive Continue to monitor vital signs  Physical debility/ambulatory dysfunction PT to assess Fall precautions    DVT prophylaxis:  Heparin drip  Code Status: Full  Family Communication:  None at bedside Consults called: Vascular surgery       Objective: Vitals:   03/30/19 0118 03/30/19 0500 03/30/19 0511 03/30/19 0806  BP: 105/74  111/69 119/66  Pulse: 81  83 82  Resp: 13  17 13   Temp: 97.9 F (36.6 C)  97.7 F (36.5 C) 97.9 F (36.6 C)  TempSrc:  Oral  Oral Oral  SpO2: 100%  100% 100%  Weight:  84.8 kg    Height:        Intake/Output Summary (Last 24 hours) at 03/30/2019 4235 Last data filed at 03/30/2019 0300 Gross per 24 hour  Intake 2107.32 ml  Output 50 ml  Net 2057.32 ml   Filed Weights   03/29/19 1133 03/30/19 0500  Weight: 82.6 kg 84.8 kg    Exam:  . General: 58 y.o. year-old female well developed well nourished in no acute distress.  Alert and interactive. . Cardiovascular: Regular rate and rhythm with no rubs or gallops.  No thyromegaly or JVD noted.   Marland Kitchen Respiratory: Clear to auscultation with no wheezes or rales. Good inspiratory effort. . Abdomen: Soft nontender nondistended with normal bowel sounds x4 quadrants. . Musculoskeletal: Left above-the-knee amputation, stump is wrapped with surgical dressing. Marland Kitchen Psychiatry: Mood is appropriate for condition and setting   Data Reviewed: CBC: Recent Labs  Lab 03/29/19 1200 03/30/19 0323  WBC 12.0* 10.3  NEUTROABS 7.9*  --   HGB 11.3* 9.9*  HCT 37.1 31.1*  MCV 99.7 98.1  PLT 380 361   Basic Metabolic Panel: Recent Labs  Lab 03/29/19 1200 03/30/19 0323  NA 137 137  K 4.3 4.4  CL 103 104  CO2 23 25  GLUCOSE 100* 137*  BUN 21* 20  CREATININE 1.09* 0.97  CALCIUM 9.2 8.5*   GFR: Estimated Creatinine Clearance: 68.2 mL/min (by C-G formula based on SCr of 0.97 mg/dL). Liver Function Tests: Recent Labs  Lab 03/29/19 1200  AST 18  ALT 15  ALKPHOS 68  BILITOT 0.8  PROT 7.9  ALBUMIN 3.1*   No results for input(s): LIPASE, AMYLASE in the last 168 hours. No results for input(s): AMMONIA in the last 168 hours. Coagulation Profile: No results for input(s): INR, PROTIME in the last 168 hours. Cardiac Enzymes: No results for input(s): CKTOTAL, CKMB, CKMBINDEX, TROPONINI in the last 168 hours. BNP (last 3 results) No results for input(s): PROBNP in the last 8760 hours. HbA1C: No results for input(s): HGBA1C in the last 72 hours. CBG: Recent Labs   Lab 03/30/19 0850  GLUCAP 106*   Lipid Profile: No results for input(s): CHOL, HDL, LDLCALC, TRIG, CHOLHDL, LDLDIRECT in the last 72 hours. Thyroid Function Tests: No results for input(s): TSH, T4TOTAL, FREET4, T3FREE, THYROIDAB in the last 72 hours. Anemia Panel: No results for input(s): VITAMINB12, FOLATE, FERRITIN, TIBC, IRON, RETICCTPCT in the last 72 hours. Urine analysis:    Component Value Date/Time   COLORURINE YELLOW 02/15/2019 1900   APPEARANCEUR HAZY (A) 02/15/2019 1900   LABSPEC >1.046 (H) 02/15/2019 1900   PHURINE 5.0 02/15/2019 1900   GLUCOSEU NEGATIVE 02/15/2019 1900   HGBUR NEGATIVE 02/15/2019 1900   BILIRUBINUR NEGATIVE 02/15/2019 1900   KETONESUR NEGATIVE 02/15/2019 1900   PROTEINUR 30 (A) 02/15/2019 1900   UROBILINOGEN 1.0 02/06/2015 1159   NITRITE NEGATIVE 02/15/2019 1900   LEUKOCYTESUR NEGATIVE 02/15/2019 1900   Sepsis Labs: @LABRCNTIP (procalcitonin:4,lacticidven:4)  ) Recent Results (from the past 240 hour(s))  Wound or Superficial Culture     Status: None (Preliminary result)   Collection Time: 03/29/19 12:12 PM  Result Value Ref Range Status   Specimen Description WOUND LEFT LEG  Final   Special Requests NONE  Final   Gram  Stain   Final    RARE WBC PRESENT, PREDOMINANTLY PMN ABUNDANT GRAM POSITIVE COCCI ABUNDANT GRAM NEGATIVE RODS ABUNDANT GRAM POSITIVE RODS Performed at [facility], [place] [address] [place], [place] [address]    Culture PENDING  Incomplete   Report Status PENDING  Incomplete      Studies: No results found.  Scheduled Meds: . sodium chloride flush  3 mL Intravenous Q12H  . sodium chloride flush  3 mL Intravenous Q12H    Continuous Infusions: . sodium chloride    . heparin 1,150 Units/hr (03/30/19 0527)  . piperacillin-tazobactam (ZOSYN)  IV 3.375 g (03/30/19 0522)  . vancomycin       LOS: 1 day     Kayleen Memos, MD [facility] Pager [phone]  If 7PM-7AM, please contact night-coverage  [email or web address] Password [facility] 03/30/2019, 9:03 AM

## 2019-03-30 NOTE — Progress Notes (Signed)
ANTICOAGULATION CONSULT NOTE - Initial Consult  Pharmacy Consult for heparin Indication: VTE treatment  Allergies  Allergen Reactions  . Propoxyphene N-Acetaminophen Anaphylaxis and Swelling  . Strawberry Extract Anaphylaxis and Shortness Of Breath    "I go into shock"  . Buspar [Buspirone] Swelling    "Swells my face"  . Grapefruit Extract Other (See Comments)    Interaction with medications she's taking  . Tape Itching    Patient says that it itches and swells with some adhesive dressings.    Patient Measurements: Height: 5\' 4"  (162.6 cm) Weight: 186 lb 15.2 oz (84.8 kg) IBW/kg (Calculated) : 54.7 Heparin Dosing Weight: 72.6 kg  Vital Signs: Temp: 97.8 F (36.6 C) (03/31 1611) Temp Source: Oral (03/31 1611) BP: 123/77 (03/31 1611) Pulse Rate: 81 (03/31 1611)  Labs: Recent Labs    03/29/19 1200 03/29/19 2128 03/30/19 0323 03/30/19 1346 03/30/19 1450  HGB 11.3*  --  9.9*  --   --   HCT 37.1  --  31.1*  --   --   PLT 380  --  313  --   --   APTT  --  33  --   --  52*  HEPARINUNFRC  --  1.62*  --  0.35  --   CREATININE 1.09*  --  0.97  --   --     Estimated Creatinine Clearance: 68.2 mL/min (by C-G formula based on SCr of 0.97 mg/dL).   Medical History: Past Medical History:  Diagnosis Date  . Anxiety   . Asthma   . Bipolar disorder ([place])   . Breast lump 02/2008   Biopsy 05/2008: showed no evidence of malignancy  . Breast mass in female    bi lat   . CHF (congestive heart failure) ([place])   . Chronic pain syndrome   . Degenerative joint disease of knee   . Dental caries   . Depression   . 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 Endometrial mass 10/12/2012   Endometrium, biopsy on 11/04/12 - PROLIFERATIVE ENDOMETRIUM AND ABUNDANT MUCUS. NO HYPERPLASIA OR CARCINOMA.   . Fibromyalgia   . Hyperlipidemia   . Hypertension   . Irregular menses 9/08  . Joint pain   . Mixed stress and urge urinary incontinence     [facility], underwent cystoscopy & uroflowmetry   . Pulmonary edema   . PVD (peripheral vascular disease) ([place])    s/p L fem-pop bypass 11/21/08; graft occluded 12/28/08;  aortogram w/ bilat LE runoff: 1.  bilat diffuse SFA occlusive dz, 2.  Mod to severe above-knee popliteal dz,  3.  Bilat 3-vessel runoff w/ mild tibial occlusive dz.  . Restless leg syndrome   . Sigmoid diverticulitis 10/26/2012  . Tobacco abuse   . Wears glasses     Assessment: 60 yof with hx of L AKA, presenting with foul smelling, thin yellow drainage, and some granulation necrotic skin edges. On Xarelto PTA. Heparin was started at 6am today. The baseline heparin level was elevated suggesting Xarelto influence but today's heparin level is loweer than expected -heparin level = 0.35, aPTT= 52   Goal of Therapy:  Heparin level 0.3-0.7 units/ml aPTT 66-102 seconds Monitor platelets by anticoagulation protocol: Yes   Plan: -Increase heparin to 1300 units/hr -Heparin level and aPTT in 6 hours and daily wth CBC daily  Hildred Laser, PharmD Clinical Pharmacist **Pharmacist phone directory can now be found on [email or web address] ([email or web address]).  Listed under [place].

## 2019-03-31 LAB — CBC
HCT: 30 % — ABNORMAL LOW (ref 36.0–46.0)
Hemoglobin: 9.3 g/dL — ABNORMAL LOW (ref 12.0–15.0)
MCH: 30.2 pg (ref 26.0–34.0)
MCHC: 31 g/dL (ref 30.0–36.0)
MCV: 97.4 fL (ref 80.0–100.0)
Platelets: 334 K/uL (ref 150–400)
RBC: 3.08 MIL/uL — ABNORMAL LOW (ref 3.87–5.11)
RDW: 12.8 % (ref 11.5–15.5)
WBC: 11.4 K/uL — ABNORMAL HIGH (ref 4.0–10.5)
nRBC: 0 % (ref 0.0–0.2)

## 2019-03-31 LAB — AEROBIC CULTURE W GRAM STAIN (SUPERFICIAL SPECIMEN)

## 2019-03-31 LAB — AEROBIC CULTURE  (SUPERFICIAL SPECIMEN)

## 2019-03-31 LAB — APTT
aPTT: 102 seconds — ABNORMAL HIGH (ref 24–36)
aPTT: 95 seconds — ABNORMAL HIGH (ref 24–36)

## 2019-03-31 LAB — HEPARIN LEVEL (UNFRACTIONATED)
Heparin Unfractionated: 0.79 [IU]/mL — ABNORMAL HIGH (ref 0.30–0.70)
Heparin Unfractionated: 0.83 IU/mL — ABNORMAL HIGH (ref 0.30–0.70)

## 2019-03-31 LAB — GLUCOSE, CAPILLARY: Glucose-Capillary: 112 mg/dL — ABNORMAL HIGH (ref 70–99)

## 2019-03-31 MED ORDER — GABAPENTIN 400 MG PO CAPS
800.0000 mg | ORAL_CAPSULE | Freq: Two times a day (BID) | ORAL | Status: DC
  Administered 2019-03-31 – 2019-04-08 (×17): 800 mg via ORAL
  Filled 2019-03-31 (×18): qty 2

## 2019-03-31 MED ORDER — GABAPENTIN 400 MG PO CAPS
1600.0000 mg | ORAL_CAPSULE | Freq: Every day | ORAL | Status: DC
  Administered 2019-03-31 – 2019-04-07 (×8): 1600 mg via ORAL
  Filled 2019-03-31 (×8): qty 4

## 2019-03-31 NOTE — Progress Notes (Addendum)
  Progress Note    03/31/2019 9:10 AM 2 Days Post-Op  Subjective:  Some pain overnight.  Dressing changed by nursing staff this morning per patient   Vitals:   03/31/19 0620 03/31/19 0752  BP: 129/87 118/70  Pulse: 83 (!) 26  Resp: 16 19  Temp: (!) 97.4 F (36.3 C) 98.4 F (36.9 C)  SpO2: 100% 99%   Physical Exam: Lungs:  Non labored Incisions:  Dressing left in place L AKA; no breakthrough bleeding Neurologic: A&O  CBC    Component Value Date/Time   WBC 11.4 (H) 03/31/2019 0740   RBC 3.08 (L) 03/31/2019 0740   HGB 9.3 (L) 03/31/2019 0740   HCT 30.0 (L) 03/31/2019 0740   PLT 334 03/31/2019 0740   MCV 97.4 03/31/2019 0740   MCH 30.2 03/31/2019 0740   MCHC 31.0 03/31/2019 0740   RDW 12.8 03/31/2019 0740   LYMPHSABS 2.7 03/29/2019 1200   MONOABS 1.1 (H) 03/29/2019 1200   EOSABS 0.2 03/29/2019 1200   BASOSABS 0.1 03/29/2019 1200    BMET    Component Value Date/Time   NA 137 03/30/2019 0323   K 4.4 03/30/2019 0323   CL 104 03/30/2019 0323   CO2 25 03/30/2019 0323   GLUCOSE 137 (H) 03/30/2019 0323   BUN 20 03/30/2019 0323   CREATININE 0.97 03/30/2019 0323   CREATININE 0.83 07/16/2013 1105   CALCIUM 8.5 (L) 03/30/2019 0323   GFRNONAA >60 03/30/2019 0323   GFRNONAA >60 07/29/2011 1220   GFRAA >60 03/30/2019 0323   GFRAA >60 07/29/2011 1220    INR    Component Value Date/Time   INR 1.07 02/14/2019 1655     Intake/Output Summary (Last 24 hours) at 03/31/2019 0910 Last data filed at 03/30/2019 2100 Gross per 24 hour  Intake 653.48 ml  Output -  Net 653.48 ml     Assessment/Plan:  58 y.o. female is s/p debridement L AKA 2 Days Post-Op   Continue BID wet to dry dressing changes Continue IV heparin for now Continue IV antibiotics Vascular considering return to OR and placement of wound vac later this week  Dagoberto Ligas, PA-C [facility] [phone] 03/31/2019 9:10 AM  Agree with the above.  Will do dressing change tomorrow  and make determination about VAC  [place] Brabham

## 2019-03-31 NOTE — Progress Notes (Addendum)
[place] for Heparin Indication: VTE treatment  Allergies  Allergen Reactions  . Propoxyphene N-Acetaminophen Anaphylaxis and Swelling  . Strawberry Extract Anaphylaxis and Shortness Of Breath    "I go into shock"  . Buspar [Buspirone] Swelling    "Swells my face"  . Grapefruit Extract Other (See Comments)    Interaction with medications she's taking  . Tape Itching    Patient says that it itches and swells with some adhesive dressings.    Patient Measurements: Height: 5\' 4"  (162.6 cm) Weight: 185 lb 6.5 oz (84.1 kg) IBW/kg (Calculated) : 54.7 Heparin Dosing Weight: 72.6 kg  Vital Signs: Temp: 98.4 F (36.9 C) (04/01 0752) Temp Source: Oral (04/01 0752) BP: 118/70 (04/01 0752) Pulse Rate: 26 (04/01 0752)  Labs: Recent Labs    03/29/19 1200  03/29/19 2128 03/30/19 0323 03/30/19 1346 03/30/19 1450 03/30/19 2241 03/31/19 0740  HGB 11.3*  --   --  9.9*  --   --   --  9.3*  HCT 37.1  --   --  31.1*  --   --   --  30.0*  PLT 380  --   --  313  --   --   --  334  APTT  --    < > 33  --   --  52* 42* 102*  HEPARINUNFRC  --   --  1.62*  --  0.35  --   --  0.79*  CREATININE 1.09*  --   --  0.97  --   --   --   --    < > = values in this interval not displayed.    Estimated Creatinine Clearance: 68 mL/min (by C-G formula based on SCr of 0.97 mg/dL).   Medical History: Past Medical History:  Diagnosis Date  . Anxiety   . Asthma   . Bipolar disorder ([place])   . Breast lump 02/2008   Biopsy 05/2008: showed no evidence of malignancy  . Breast mass in female    bi lat   . CHF (congestive heart failure) ([place])   . Chronic pain syndrome   . Degenerative joint disease of knee   . Dental caries   . Depression   . 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 Endometrial mass 10/12/2012   Endometrium, biopsy on 11/04/12 - PROLIFERATIVE ENDOMETRIUM AND ABUNDANT MUCUS. NO HYPERPLASIA OR  CARCINOMA.   . Fibromyalgia   . Hyperlipidemia   . Hypertension   . Irregular menses 9/08  . Joint pain   . Mixed stress and urge urinary incontinence    [facility], underwent cystoscopy & uroflowmetry   . Pulmonary edema   . PVD (peripheral vascular disease) ([place])    s/p L fem-pop bypass 11/21/08; graft occluded 12/28/08;  aortogram w/ bilat LE runoff: 1.  bilat diffuse SFA occlusive dz, 2.  Mod to severe above-knee popliteal dz,  3.  Bilat 3-vessel runoff w/ mild tibial occlusive dz.  . Restless leg syndrome   . Sigmoid diverticulitis 10/26/2012  . Tobacco abuse   . Wears glasses     Assessment: 76 yof with hx of L AKA, presenting with foul smelling, thin yellow drainage, and some granulation necrotic skin edges. On Xarelto PTA. She is s/p debridement L AKA and plans for wound vac. -heparin level= 0.79, Aptt= 102  Goal of Therapy:  Heparin level 0.3-0.7 units/ml aPTT 66-102 seconds Monitor  platelets by anticoagulation protocol: Yes   Plan: -Decrease heparin to 1400 units/hr -Re-check heparin level and aPTT at [place], PharmD Clinical Pharmacist **Pharmacist phone directory can now be found on [email or web address] ([email or web address]).  Listed under [place].

## 2019-03-31 NOTE — Progress Notes (Signed)
PROGRESS NOTE  Alisha [name] LEX:[id number] DOB: Apr 14, 1961 DOA: 03/29/2019 PCP: Nolene Ebbs, MD  HPI/Recap of past 24 hours: Alisha [name] is a 58 y.o. 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  Patient reported severe pain involving the left leg in the ED. patient had fallen 1 week prior to presentation at the nursing facility when attempting to get out of her wheelchair with the wheels unlocked, fell onto the AKA stump, and a couple staples came out per report of nursing facility personnel.  She was seen in the emergency department after this fall, the site appeared to be intact per documentation, she was given a small fluid bolus for low blood pressure, and returned to her nursing facility.  Since that time, there has been increasing pain and increasingly purulent and malodorous drainage.  ED Course: Upon arrival to the ED, patient is found to be afebrile, saturating well on room air, hypotensive initially with blood pressure 86/65, and normal heart rate and respirations.  EKG features a sinus rhythm.  Chemistry panel is unremarkable and CBC notable for leukocytosis to 12,000.  Lactic acid is reassuringly normal.  Patient was given 500 cc normal saline, fentanyl, vancomycin, Zosyn, and clindamycin in the ED.  Vascular surgery was consulted and is taking the patient to the OR for debridement and washout, anticipates need for additional debridements during this hospitalization, and recommends admission to the medical service.  03/31/2019: Patient seen.  Input from the vascular surgery team is appreciated.  Vascular surgery team is directing care.  Wound culture is growing multiple organisms.  For wound VAC placement left up out of this week.   Otherwise, no new complaints.   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 Follow final cultures Vascular surgery team is directing care. For wound VAC placement later part of the week.  History of DVT on Xarelto: Continue to hold off Xarelto Continue heparin drip until vascular surgery has completed all procedures Pharmacy managing heparin drip  Chronic combined systolic and diastolic CHF: -Last 2D echo done on in February 2019 revealed LVEF 35 to 40% with grade 1 diastolic dysfunction -Stable.  No CHF symptoms.    Severe peripheral vascular disease status post left above-the-knee amputation: Continue aspirin, Lipitor  COPD: Stable.    Chronic tobacco use disorder: Counseled.    Hypertension Optimized. Continue to monitor vital signs  Physical debility/ambulatory dysfunction PT to assess Fall precautions (avoid hypotensive episodes)  DVT prophylaxis:  Heparin drip  Code Status: Full  Family Communication:  None at bedside Consults called: Vascular surgery     Objective: Vitals:   03/30/19 2329 03/31/19 0500 03/31/19 0620 03/31/19 0752  BP: 132/79  129/87 118/70  Pulse: 83  83 (!) 26  Resp: 19  16 19   Temp: (!) 97.5 F (36.4 C)  (!) 97.4 F (36.3 C) 98.4 F (36.9 C)  TempSrc:  Oral  Oral Oral  SpO2: 97%  100% 99%  Weight:  84.1 kg    Height:        Intake/Output Summary (Last 24 hours) at 03/31/2019 1528 Last data filed at 03/31/2019 1210 Gross per 24 hour  Intake 731.56 ml  Output --  Net 731.56  ml   Filed Weights   03/29/19 1133 03/30/19 0500 03/31/19 0500  Weight: 82.6 kg 84.8 kg 84.1 kg    Exam:   General: 58 y.o. year-old female well developed well nourished in no acute distress.  Alert and interactive.  Obese.  Cardiovascular: S1-S2.    Respiratory: Clear to auscultation.  Abdomen: Obese, soft and nontender.  Organs are difficult to assess.   Extremities: Status post left above-the-knee amputation, stump is wrapped with surgical dressing.  Data Reviewed: CBC: Recent Labs  Lab 03/29/19 1200 03/30/19 0323 03/31/19 0740  WBC 12.0* 10.3 11.4*  NEUTROABS 7.9*  --   --   HGB 11.3* 9.9* 9.3*  HCT 37.1 31.1* 30.0*  MCV 99.7 98.1 97.4  PLT 380 313 675   Basic Metabolic Panel: Recent Labs  Lab 03/29/19 1200 03/30/19 0323  NA 137 137  K 4.3 4.4  CL 103 104  CO2 23 25  GLUCOSE 100* 137*  BUN 21* 20  CREATININE 1.09* 0.97  CALCIUM 9.2 8.5*   GFR: Estimated Creatinine Clearance: 68 mL/min (by C-G formula based on SCr of 0.97 mg/dL). Liver Function Tests: Recent Labs  Lab 03/29/19 1200  AST 18  ALT 15  ALKPHOS 68  BILITOT 0.8  PROT 7.9  ALBUMIN 3.1*   No results for input(s): LIPASE, AMYLASE in the last 168 hours. No results for input(s): AMMONIA in the last 168 hours. Coagulation Profile: No results for input(s): INR, PROTIME in the last 168 hours. Cardiac Enzymes: No results for input(s): CKTOTAL, CKMB, CKMBINDEX, TROPONINI in the last 168 hours. BNP (last 3 results) No results for input(s): PROBNP in the last 8760 hours. HbA1C: No results for input(s): HGBA1C in the last 72 hours. CBG: Recent Labs  Lab 03/30/19 0850 03/31/19 0637  GLUCAP 106* 112*   Lipid Profile: No results for input(s): CHOL, HDL, LDLCALC, TRIG, CHOLHDL, LDLDIRECT in the last 72 hours. Thyroid Function Tests: No results for input(s): TSH, T4TOTAL, FREET4, T3FREE, THYROIDAB in the last 72 hours. Anemia Panel: No results for input(s): VITAMINB12, FOLATE, FERRITIN,  TIBC, IRON, RETICCTPCT in the last 72 hours. Urine analysis:    Component Value Date/Time   COLORURINE YELLOW 02/15/2019 1900   APPEARANCEUR HAZY (A) 02/15/2019 1900   LABSPEC >1.046 (H) 02/15/2019 1900   PHURINE 5.0 02/15/2019 1900   GLUCOSEU NEGATIVE 02/15/2019 1900   HGBUR NEGATIVE 02/15/2019 1900   BILIRUBINUR NEGATIVE 02/15/2019 1900   KETONESUR NEGATIVE 02/15/2019 1900   PROTEINUR 30 (A) 02/15/2019 1900   UROBILINOGEN 1.0 02/06/2015 1159   NITRITE NEGATIVE 02/15/2019 1900   LEUKOCYTESUR NEGATIVE 02/15/2019 1900   Sepsis Labs: @LABRCNTIP (procalcitonin:4,lacticidven:4)  ) Recent Results (from the past 240 hour(s))  Wound or Superficial Culture     Status: Abnormal   Collection Time: 03/29/19 12:12 PM  Result Value Ref Range Status   Specimen Description WOUND LEFT LEG  Final   Special Requests NONE  Final   Gram Stain   Final    RARE WBC PRESENT, PREDOMINANTLY PMN ABUNDANT GRAM POSITIVE COCCI ABUNDANT GRAM NEGATIVE RODS ABUNDANT GRAM POSITIVE RODS Performed at [facility], [place] [address] [place], [place] [address]    Culture MULTIPLE ORGANISMS  PRESENT, NONE PREDOMINANT (A)  Final   Report Status 03/31/2019 FINAL  Final      Studies: No results found.  Scheduled Meds:  aspirin EC  81 mg Oral Daily   atorvastatin  40 mg Oral QHS   DULoxetine  60 mg Oral BID   gabapentin  1,600 mg Oral QHS   gabapentin  800 mg Oral BID   hydrOXYzine  50 mg Oral QHS   sodium chloride flush  3 mL Intravenous Q12H   sodium chloride flush  3 mL Intravenous Q12H    Continuous Infusions:  sodium chloride     heparin 1,400 Units/hr (03/31/19 1141)   piperacillin-tazobactam (ZOSYN)  IV 3.375 g (03/31/19 1336)   vancomycin 1,250 mg (03/30/19 2034)     LOS: 2 days     Bonnell Public, MD [facility] Pager [phone] If 7PM-7AM, please contact night-coverage [email or web address] Password [facility] 03/31/2019, 3:28 PM

## 2019-03-31 NOTE — Evaluation (Addendum)
Physical Therapy Evaluation Patient Details Name: Alisha [name] MRN: [id number] DOB: July 18, 1961 Today's Date: 03/31/2019   History of Present Illness  58 yo F presents to ED after fall from [facility] c/o stump pain and drainage. h/o PVD, bipolar disorder, CHF, anxiety/depression, fibromyalgia, asthma, HTN, chronic venous TE.   Clinical Impression  Pt found in bed with dressing applied to Lt AKA stump, reporting she is very tired but has not been in the chair since yesterday. Reports she has fallen 3-4 times because she forgets that her leg is not there. Currently living in assisted living facility and using a WC for primary mobility. She was able to move from supine to sit with verbal cues and use of upper extremities. ModAx2 for sit to stand and performed a pivot transfer with use of RW. Pt became upset and cried during session saying that she misses her leg. Assured her that she will continue to learn how to become mobile in time but for now has to focus on healing of incision. Encouraged her to leave dressing alone and allow nursing to help her change. Will benefit from balance and mobility training to improve independence.     Follow Up Recommendations SNF    Equipment Recommendations  Wheelchair (measurements PT);Standard walker    Recommendations for Other Services       Precautions / Restrictions Precautions Precautions: Fall Restrictions Weight Bearing Restrictions: No      Mobility  Bed Mobility Overal bed mobility: Modified Independent             General bed mobility comments: requires use of hand rails with verbal cues  Transfers Overall transfer level: Needs assistance Equipment used: Rolling walker (2 wheeled);2 person hand held assist Transfers: Sit to/from Stand;Stand Pivot Transfers Sit to Stand: +2 physical assistance;Mod assist Stand pivot transfers: +2 physical assistance;Min assist          Ambulation/Gait                Stairs             Wheelchair Mobility    Modified Rankin (Stroke Patients Only)       Balance Overall balance assessment: Needs assistance Sitting-balance support: Single extremity supported Sitting balance-Leahy Scale: Good     Standing balance support: Bilateral upper extremity supported Standing balance-Leahy Scale: Zero(without UE support) Standing balance comment: requires assistance with RW                             Pertinent Vitals/Pain Pain Assessment: 0-10 Pain Score: 5  Pain Location: LEft stump Pain Descriptors / Indicators: ("hurts") Pain Intervention(s): Monitored during session;Utilized relaxation techniques    Home Living                        Prior Function                 Hand Dominance        Extremity/Trunk Assessment        Lower Extremity Assessment Lower Extremity Assessment: Overall WFL for tasks assessed(Rt LE only)       Communication      Cognition Arousal/Alertness: Lethargic Behavior During Therapy: WFL for tasks assessed/performed Overall Cognitive Status: Within Functional Limits for tasks assessed  General Comments      Exercises     Assessment/Plan    PT Assessment Patient needs continued PT services  PT Problem List Decreased mobility;Decreased activity tolerance;Pain;Decreased balance       PT Treatment Interventions DME instruction;Therapeutic activities;Gait training;Therapeutic exercise;Patient/family education;Balance training;Wheelchair mobility training;Functional mobility training    PT Goals (Current goals can be found in the Care Plan section)  Acute Rehab PT Goals Patient Stated Goal: be mobile again PT Goal Formulation: With patient Time For Goal Achievement: 04/14/19 Potential to Achieve Goals: Good    Frequency Min 3X/week   Barriers to discharge        Co-evaluation               AM-PAC PT "6 Clicks" Mobility   Outcome Measure Help needed turning from your back to your side while in a flat bed without using bedrails?: None Help needed moving from lying on your back to sitting on the side of a flat bed without using bedrails?: A Little Help needed moving to and from a bed to a chair (including a wheelchair)?: A Lot Help needed standing up from a chair using your arms (e.g., wheelchair or bedside chair)?: A Lot Help needed to walk in hospital room?: A Lot Help needed climbing 3-5 steps with a railing? : Total 6 Click Score: 14    End of Session Equipment Utilized During Treatment: Gait belt Activity Tolerance: Patient tolerated treatment well;Patient limited by lethargy;Patient limited by pain Patient left: in chair;with chair alarm set;with call bell/phone within reach Nurse Communication: Mobility status;Need for lift equipment PT Visit Diagnosis: Unsteadiness on feet (R26.81);Other abnormalities of gait and mobility (R26.89);Repeated falls (R29.6);History of falling (Z91.81);Difficulty in walking, not elsewhere classified (R26.2);Pain    Time: 8756-4332 PT Time Calculation (min) (ACUTE ONLY): 34 min   Charges:   PT Evaluation $PT Eval Moderate Complexity: 1 Mod PT Treatments $Therapeutic Activity: 8-22 mins   Selinda Eon PT, DPT Acute Rehab [phone]

## 2019-03-31 NOTE — Progress Notes (Signed)
[facility] for Heparin Indication: VTE treatment  Allergies  Allergen Reactions  . Propoxyphene N-Acetaminophen Anaphylaxis and Swelling  . Strawberry Extract Anaphylaxis and Shortness Of Breath    "I go into shock"  . Buspar [Buspirone] Swelling    "Swells my face"  . Grapefruit Extract Other (See Comments)    Interaction with medications she's taking  . Tape Itching    Patient says that it itches and swells with some adhesive dressings.    Patient Measurements: Height: 5\' 4"  (162.6 cm) Weight: 185 lb 6.5 oz (84.1 kg) IBW/kg (Calculated) : 54.7 Heparin Dosing Weight: 72.6 kg  Vital Signs: Temp: 98.1 F (36.7 C) (04/01 1622) Temp Source: Oral (04/01 1622) BP: 129/82 (04/01 1622) Pulse Rate: 93 (04/01 1622)  Labs: Recent Labs    03/29/19 1200  03/30/19 0323 03/30/19 1346  03/30/19 2241 03/31/19 0740 03/31/19 1755  HGB 11.3*  --  9.9*  --   --   --  9.3*  --   HCT 37.1  --  31.1*  --   --   --  30.0*  --   PLT 380  --  313  --   --   --  334  --   APTT  --    < >  --   --    < > 42* 102* 95*  HEPARINUNFRC  --    < >  --  0.35  --   --  0.79* 0.83*  CREATININE 1.09*  --  0.97  --   --   --   --   --    < > = values in this interval not displayed.    Estimated Creatinine Clearance: 68 mL/min (by C-G formula based on SCr of 0.97 mg/dL).   Medical History: Past Medical History:  Diagnosis Date  . Anxiety   . Asthma   . Bipolar disorder ([place])   . Breast lump 02/2008   Biopsy 05/2008: showed no evidence of malignancy  . Breast mass in female    bi lat   . CHF (congestive heart failure) ([place])   . Chronic pain syndrome   . Degenerative joint disease of knee   . Dental caries   . Depression   . 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 Endometrial mass 10/12/2012   Endometrium, biopsy on 11/04/12 - PROLIFERATIVE ENDOMETRIUM AND ABUNDANT MUCUS. NO HYPERPLASIA OR CARCINOMA.   .  Fibromyalgia   . Hyperlipidemia   . Hypertension   . Irregular menses 9/08  . Joint pain   . Mixed stress and urge urinary incontinence    Being followed by [facility], underwent cystoscopy & uroflowmetry   . Pulmonary edema   . PVD (peripheral vascular disease) ([place])    s/p L fem-pop bypass 11/21/08; graft occluded 12/28/08;  aortogram w/ bilat LE runoff: 1.  bilat diffuse SFA occlusive dz, 2.  Mod to severe above-knee popliteal dz,  3.  Bilat 3-vessel runoff w/ mild tibial occlusive dz.  . Restless leg syndrome   . Sigmoid diverticulitis 10/26/2012  . Tobacco abuse   . Wears glasses    Assessment: 62 yof with hx of L AKA, presenting with foul smelling, thin yellow drainage, and some granulation necrotic skin edges. On Xarelto PTA. She is s/p debridement L AKA and plans for wound vac.  APTT is therapeutic at 95. Heparin level still falsely elevated  Goal of Therapy:  Heparin level 0.3-0.7 units/ml aPTT 66-102 seconds Monitor platelets by anticoagulation protocol: Yes   Plan: Continue heparin gtt at 1,400 units/hr Monitor daily aPTT / heparin level, CBC, s/s of bleed  [name], PharmD, BCPS, BCIDP Clinical Pharmacist 03/31/2019 7:18 PM

## 2019-03-31 NOTE — Progress Notes (Signed)
Left AKA stump dressing change done without difficulty.  Patient tolerated well.

## 2019-04-01 LAB — HEPARIN LEVEL (UNFRACTIONATED): Heparin Unfractionated: 0.62 IU/mL (ref 0.30–0.70)

## 2019-04-01 LAB — APTT: aPTT: 94 seconds — ABNORMAL HIGH (ref 24–36)

## 2019-04-01 NOTE — Progress Notes (Signed)
PROGRESS NOTE  Alisha [name] GXQ:[id number] DOB: 01-08-1961 DOA: 03/29/2019 PCP: Nolene Ebbs, MD  HPI/Recap of past 24 hours: Alisha [name] is a 58 y.o. 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  Patient reported severe pain involving the left leg in the ED. patient had fallen 1 week prior to presentation at the nursing facility when attempting to get out of her wheelchair with the wheels unlocked, fell onto the AKA stump, and a couple staples came out per report of nursing facility personnel.  She was seen in the emergency department after this fall, the site appeared to be intact per documentation, she was given a small fluid bolus for low blood pressure, and returned to her nursing facility.  Since that time, there has been increasing pain and increasingly purulent and malodorous drainage.  ED Course: Upon arrival to the ED, patient is found to be afebrile, saturating well on room air, hypotensive initially with blood pressure 86/65, and normal heart rate and respirations.  EKG features a sinus rhythm.  Chemistry panel is unremarkable and CBC notable for leukocytosis to 12,000.  Lactic acid is reassuringly normal.  Patient was given 500 cc normal saline, fentanyl, vancomycin, Zosyn, and clindamycin in the ED.  Vascular surgery was consulted and is taking the patient to the OR for debridement and washout, anticipates need for additional debridements during this hospitalization, and recommends admission to the medical service.  03/31/2019: Patient seen.  Input from the vascular surgery team is appreciated.  Vascular surgery team is directing care.  Wound culture is growing multiple organisms.  For wound VAC placement left up out of this week.   Otherwise, no new complaints.   04/01/2019: No new changes.  Vascular surgery team is directing care.  For further OR later in the week.  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 Follow final cultures Vascular surgery team is directing care. For wound VAC placement later part of the week.  History of DVT on Xarelto: Continue to hold off Xarelto Continue heparin drip until vascular surgery has completed all procedures Pharmacy managing heparin drip  Chronic combined systolic and diastolic CHF: -Last 2D echo done on in February 2019 revealed LVEF 35 to 40% with grade 1 diastolic dysfunction -Stable.  No CHF symptoms.    Severe peripheral vascular disease status post left above-the-knee amputation: Continue aspirin, Lipitor  COPD: Stable.    Chronic tobacco use disorder: Counseled.    Hypertension Optimized. Continue to monitor vital signs  Physical debility/ambulatory dysfunction PT to assess Fall precautions (avoid hypotensive episodes)  DVT prophylaxis:  Heparin drip  Code Status: Full  Family Communication:  None at bedside Consults called: Vascular surgery     Objective: Vitals:   03/31/19 2025 03/31/19 2312 04/01/19 0618 04/01/19 1033  BP: 116/77 99/66  (!) 128/94  Pulse: 85 84    Resp: 17 18 (!)  9 19  Temp: (!) 97.5 F (36.4 C) 98 F (36.7 C)    TempSrc: Oral Axillary    SpO2: 96% 98%  95%  Weight:   85.4 kg   Height:        Intake/Output Summary (Last 24 hours) at 04/01/2019 1123 Last data  filed at 04/01/2019 0856 Gross per 24 hour  Intake 483 ml  Output -  Net 483 ml   Filed Weights   03/30/19 0500 03/31/19 0500 04/01/19 0618  Weight: 84.8 kg 84.1 kg 85.4 kg    Exam:  . General: 58 y.o. year-old female well developed well nourished in no acute distress.  Alert and interactive.  Obese. . Cardiovascular: S1-S2.   Marland Kitchen Respiratory: Clear to auscultation. . Abdomen: Obese, soft and nontender.  Organs are difficult to assess.   Extremities: Status post left above-the-knee amputation, stump is wrapped with surgical dressing.  Data Reviewed: CBC: Recent Labs  Lab 03/29/19 1200 03/30/19 0323 03/31/19 0740  WBC 12.0* 10.3 11.4*  NEUTROABS 7.9*  --   --   HGB 11.3* 9.9* 9.3*  HCT 37.1 31.1* 30.0*  MCV 99.7 98.1 97.4  PLT 380 313 528   Basic Metabolic Panel: Recent Labs  Lab 03/29/19 1200 03/30/19 0323  NA 137 137  K 4.3 4.4  CL 103 104  CO2 23 25  GLUCOSE 100* 137*  BUN 21* 20  CREATININE 1.09* 0.97  CALCIUM 9.2 8.5*   GFR: Estimated Creatinine Clearance: 68.5 mL/min (by C-G formula based on SCr of 0.97 mg/dL). Liver Function Tests: Recent Labs  Lab 03/29/19 1200  AST 18  ALT 15  ALKPHOS 68  BILITOT 0.8  PROT 7.9  ALBUMIN 3.1*   No results for input(s): LIPASE, AMYLASE in the last 168 hours. No results for input(s): AMMONIA in the last 168 hours. Coagulation Profile: No results for input(s): INR, PROTIME in the last 168 hours. Cardiac Enzymes: No results for input(s): CKTOTAL, CKMB, CKMBINDEX, TROPONINI in the last 168 hours. BNP (last 3 results) No results for input(s): PROBNP in the last 8760 hours. HbA1C: No results for input(s): HGBA1C in the last 72 hours. CBG: Recent Labs  Lab 03/30/19 0850 03/31/19 0637  GLUCAP 106* 112*   Lipid Profile: No results for input(s): CHOL, HDL, LDLCALC, TRIG, CHOLHDL, LDLDIRECT in the last 72 hours. Thyroid Function Tests: No results for input(s): TSH, T4TOTAL, FREET4, T3FREE, THYROIDAB in the last 72  hours. Anemia Panel: No results for input(s): VITAMINB12, FOLATE, FERRITIN, TIBC, IRON, RETICCTPCT in the last 72 hours. Urine analysis:    Component Value Date/Time   COLORURINE YELLOW 02/15/2019 1900   APPEARANCEUR HAZY (A) 02/15/2019 1900   LABSPEC >1.046 (H) 02/15/2019 1900   PHURINE 5.0 02/15/2019 1900   GLUCOSEU NEGATIVE 02/15/2019 1900   HGBUR NEGATIVE 02/15/2019 1900   BILIRUBINUR NEGATIVE 02/15/2019 1900   KETONESUR NEGATIVE 02/15/2019 1900   PROTEINUR 30 (A) 02/15/2019 1900   UROBILINOGEN 1.0 02/06/2015 1159   NITRITE NEGATIVE 02/15/2019 1900   LEUKOCYTESUR NEGATIVE 02/15/2019 1900   Sepsis Labs: @LABRCNTIP (procalcitonin:4,lacticidven:4)  ) Recent Results (from the past 240 hour(s))  Wound or Superficial Culture     Status: Abnormal   Collection Time: 03/29/19 12:12 PM  Result Value Ref Range Status   Specimen Description WOUND LEFT LEG  Final   Special Requests NONE  Final   Gram Stain   Final    RARE WBC PRESENT, PREDOMINANTLY PMN ABUNDANT GRAM POSITIVE COCCI ABUNDANT GRAM NEGATIVE RODS ABUNDANT GRAM POSITIVE RODS Performed at  [facility], [place] [address] [place], [place] [address]    Culture MULTIPLE ORGANISMS PRESENT, NONE PREDOMINANT (A)  Final   Report Status 03/31/2019 FINAL  Final      Studies: No results found.  Scheduled Meds: . aspirin EC  81 mg Oral Daily  . atorvastatin  40 mg Oral QHS  . DULoxetine  60 mg Oral BID  . gabapentin  1,600 mg Oral QHS  . gabapentin  800 mg Oral BID  . hydrOXYzine  50 mg Oral QHS  . sodium chloride flush  3 mL Intravenous Q12H  . sodium chloride flush  3 mL Intravenous Q12H    Continuous Infusions: . sodium chloride    . heparin 1,400 Units/hr (03/31/19 2306)  . piperacillin-tazobactam (ZOSYN)  IV 3.375 g (04/01/19 0514)  . vancomycin 1,250 mg (03/31/19 2124)     LOS: 3 days     Bonnell Public, MD [facility] Pager [phone] If 7PM-7AM, please contact night-coverage  [email or web address] Password [facility] 04/01/2019, 11:23 AM

## 2019-04-01 NOTE — Progress Notes (Signed)
Consult received for PIV placement d/t patient pulled out midline. In review of medications Fentanyl, Zosyn and Heparin are all compatible infusions. This nurse called and spoke with Vickii Penna and notified of compatibility and also made aware the patient has extremely limited assess and made RN aware to preserve IV's and veins as much as possible. VU Fran Lowes, RN VAST

## 2019-04-01 NOTE — Progress Notes (Addendum)
[facility]  Subjective  - No new complaints.   Objective 99/66 84 98 F (36.7 C) (Axillary) (!) 9 98%  Intake/Output Summary (Last 24 hours) at 04/01/2019 0738 Last data filed at 03/31/2019 1840 Gross per 24 hour  Intake 720 ml  Output -  Net 720 ml    Wet to dry dressing changed open left AKA    Dark spots are from cautery muscle and fat tissue appear healthy over all. No bleeding or active drainage.  Assessment/Planning: POD # 3 post debridement of left open AKA   Continue BID wet to dry dressing changes Continue IV heparin for now Continue IV antibiotics We will plan to return to the OR tomorrow for I& D and possible wound vac placement.  Roxy Horseman 04/01/2019 7:38 AM --  Laboratory Lab Results: Recent Labs    03/30/19 0323 03/31/19 0740  WBC 10.3 11.4*  HGB 9.9* 9.3*  HCT 31.1* 30.0*  PLT 313 334   BMET Recent Labs    03/29/19 1200 03/30/19 0323  NA 137 137  K 4.3 4.4  CL 103 104  CO2 23 25  GLUCOSE 100* 137*  BUN 21* 20  CREATININE 1.09* 0.97  CALCIUM 9.2 8.5*    COAG Lab Results  Component Value Date   INR 1.07 02/14/2019   INR 0.95 02/15/2018   INR 1.05 11/04/2017   No results found for: PTT

## 2019-04-01 NOTE — H&P (View-Only) (Signed)
[facility]  Subjective  - No new complaints.   Objective 99/66 84 98 F (36.7 C) (Axillary) (!) 9 98%  Intake/Output Summary (Last 24 hours) at 04/01/2019 0738 Last data filed at 03/31/2019 1840 Gross per 24 hour  Intake 720 ml  Output -  Net 720 ml    Wet to dry dressing changed open left AKA    Dark spots are from cautery muscle and fat tissue appear healthy over all. No bleeding or active drainage.  Assessment/Planning: POD # 3 post debridement of left open AKA   Continue BID wet to dry dressing changes Continue IV heparin for now Continue IV antibiotics We will plan to return to the OR tomorrow for I& D and possible wound vac placement.  Roxy Horseman 04/01/2019 7:38 AM --  Laboratory Lab Results: Recent Labs    03/30/19 0323 03/31/19 0740  WBC 10.3 11.4*  HGB 9.9* 9.3*  HCT 31.1* 30.0*  PLT 313 334   BMET Recent Labs    03/29/19 1200 03/30/19 0323  NA 137 137  K 4.3 4.4  CL 103 104  CO2 23 25  GLUCOSE 100* 137*  BUN 21* 20  CREATININE 1.09* 0.97  CALCIUM 9.2 8.5*    COAG Lab Results  Component Value Date   INR 1.07 02/14/2019   INR 0.95 02/15/2018   INR 1.05 11/04/2017   No results found for: PTT

## 2019-04-01 NOTE — Consult Note (Signed)
   [facility] Inpatient Consult   04/01/2019  Alisha [name] [name] September 21, 1961 [id number]    Patient was screened for [facility] [employer] services needs under her Medicare/ Next Gen insurance plan. Patient has medium (21%) unplanned readmission risk.  Patient is not currently a beneficiary of the attributed [place] in the [employer].  Reason: Her current primary care provider is Dr. Nolene Ebbs with [facility], who is not a [facility] provider and not affiliated with [facility].  This patient is currently Not eligible for [facility] Care Management Services. Membership roster used to verify non-eligible status.    Will sign off.   Mariona Scholes A. Zachary Nole, BSN, RN-BC [facility] Liaison Cell: [phone]

## 2019-04-01 NOTE — Progress Notes (Signed)
[place] for Heparin Indication: VTE treatment  Allergies  Allergen Reactions  . Propoxyphene N-Acetaminophen Anaphylaxis and Swelling  . Strawberry Extract Anaphylaxis and Shortness Of Breath    "I go into shock"  . Buspar [Buspirone] Swelling    "Swells my face"  . Grapefruit Extract Other (See Comments)    Interaction with medications she's taking  . Tape Itching    Patient says that it itches and swells with some adhesive dressings.    Patient Measurements: Height: 5\' 4"  (162.6 cm) Weight: 188 lb 4.4 oz (85.4 kg) IBW/kg (Calculated) : 54.7 Heparin Dosing Weight: 72.6 kg  Vital Signs: Temp: 98 F (36.7 C) (04/01 2312) Temp Source: Axillary (04/01 2312) BP: 99/66 (04/01 2312) Pulse Rate: 84 (04/01 2312)  Labs: Recent Labs    03/29/19 1200  03/30/19 0323  03/31/19 0740 03/31/19 1755 04/01/19 0601  HGB 11.3*  --  9.9*  --  9.3*  --   --   HCT 37.1  --  31.1*  --  30.0*  --   --   PLT 380  --  313  --  334  --   --   APTT  --    < >  --    < > 102* 95* 94*  HEPARINUNFRC  --    < >  --    < > 0.79* 0.83* 0.62  CREATININE 1.09*  --  0.97  --   --   --   --    < > = values in this interval not displayed.    Estimated Creatinine Clearance: 68.5 mL/min (by C-G formula based on SCr of 0.97 mg/dL).   Medical History: Past Medical History:  Diagnosis Date  . Anxiety   . Asthma   . Bipolar disorder ([place])   . Breast lump 02/2008   Biopsy 05/2008: showed no evidence of malignancy  . Breast mass in female    bi lat   . CHF (congestive heart failure) ([place])   . Chronic pain syndrome   . Degenerative joint disease of knee   . Dental caries   . Depression   . 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 Endometrial mass 10/12/2012   Endometrium, biopsy on 11/04/12 - PROLIFERATIVE ENDOMETRIUM AND ABUNDANT MUCUS. NO HYPERPLASIA OR CARCINOMA.   . Fibromyalgia   . Hyperlipidemia   .  Hypertension   . Irregular menses 9/08  . Joint pain   . Mixed stress and urge urinary incontinence    [facility], underwent cystoscopy & uroflowmetry   . Pulmonary edema   . PVD (peripheral vascular disease) ([place])    s/p L fem-pop bypass 11/21/08; graft occluded 12/28/08;  aortogram w/ bilat LE runoff: 1.  bilat diffuse SFA occlusive dz, 2.  Mod to severe above-knee popliteal dz,  3.  Bilat 3-vessel runoff w/ mild tibial occlusive dz.  . Restless leg syndrome   . Sigmoid diverticulitis 10/26/2012  . Tobacco abuse   . Wears glasses     Assessment: 64 yof with hx of L AKA, presenting with foul smelling, thin yellow drainage, and some granulation necrotic skin edges. On Xarelto PTA. She is s/p debridement L AKA and plans for wound vac later this week -heparin level= 0.62, hg= 9.3  Goal of Therapy:  Heparin level 0.3-0.7 units/ml aPTT 66-102 seconds Monitor platelets by anticoagulation protocol: Yes   Plan: -Continue heparin at 1400 units/hr -  Daily heparin level and CBC  Hildred Laser, PharmD Clinical Pharmacist **Pharmacist phone directory can now be found on [email or web address] ([email or web address]).  Listed under [place].

## 2019-04-01 NOTE — Progress Notes (Signed)
Pharmacy Antibiotic Note  Alisha [name] is a 58 y.o. female admitted on 03/29/2019 with AKA infection.  Pharmacy has been consulted for zosyn and vancomycin dosing.  She is s/p I&D on 3/30 and may need to return to OR. -WBC= 11.4, SCr= 0.97 and CrCl ~ 70, wound culture show multiple organisms  Plan: -Continue zosyn 3.375gm IV q8h -Continue vancomycin  1250mg  IV q24h -Will check a vancomycin level based on length of therapy  Height: 5\' 4"  (162.6 cm) Weight: 188 lb 4.4 oz (85.4 kg) IBW/kg (Calculated) : 54.7  Temp (24hrs), Avg:97.9 F (36.6 C), Min:97.5 F (36.4 C), Max:98.1 F (36.7 C)  Recent Labs  Lab 03/29/19 1200 03/29/19 2005 03/30/19 0323 03/31/19 0740  WBC 12.0*  --  10.3 11.4*  CREATININE 1.09*  --  0.97  --   LATICACIDVEN 1.4 1.1  --   --     Estimated Creatinine Clearance: 68.5 mL/min (by C-G formula based on SCr of 0.97 mg/dL).    Allergies  Allergen Reactions  . Propoxyphene N-Acetaminophen Anaphylaxis and Swelling  . Strawberry Extract Anaphylaxis and Shortness Of Breath    "I go into shock"  . Buspar [Buspirone] Swelling    "Swells my face"  . Grapefruit Extract Other (See Comments)    Interaction with medications she's taking  . Tape Itching    Patient says that it itches and swells with some adhesive dressings.    Antimicrobials this admission: Vancomycin 3/30 >>  Zosyn 3/30 >>  Clindamycin 3/30 x1  Dose adjustments this admission: N/A  Microbiology results: 3/30 Wound Cx: abundant GPR, GNR, GPC. None predominant (final)  Thank you for allowing pharmacy to be a part of this patient's care.  Hildred Laser, PharmD Clinical Pharmacist **Pharmacist phone directory can now be found on [email or web address] ([email or web address]).  Listed under [place].

## 2019-04-01 NOTE — Evaluation (Addendum)
Physical Therapy Treatment Patient Details Name: Alisha [name] MRN: [id number] DOB: 12/29/1961 Today's Date: 04/01/2019    History of Present Illness 58 yo F presents to ED after fall [facility] c/o stump pain and drainage. h/o PVD, bipolar disorder, CHF, anxiety/depression, fibromyalgia, asthma, HTN, chronic venous TE.     PT Comments    Pt found in bed and wet after spilling her coffee when she fell asleep- no burns found on skin. 2 PTs present for treatment for safety. Pt continues to require frequent verbal cues for sequencing movement both in bed mobility and in standing. Mod A x2 to stand but was CGA to MinA with frequent cuing during transfer to chair. Was able to do small hops to travel about 1/2 foot forward during transfer. Taught her to use hip flexors to lift stump to place pad for elevation. Pt asked if I think this is really going to get better- advised that she is going to continue to hurt because she is healing but to allow care team to do what they need so she heals as fast and as healthy as possible. Encouraged her to be patient and leave wound alone to allow healing but this will take some time. Pt reported feeling encouraged and ready to work. Left her in chair with alarm set and provided coffee, crackers and peanut butter per pt request.   Follow Up Recommendations  SNF     Equipment Recommendations  Wheelchair (measurements PT);Standard walker    Recommendations for Other Services       Precautions / Restrictions Precautions Precautions: Fall Restrictions Weight Bearing Restrictions: No    Mobility  Bed Mobility Overal bed mobility: Modified Independent             General bed mobility comments: used hand rails with minA from 1 PT frequent VC  Transfers Overall transfer level: Needs assistance Equipment used: Rolling walker (2 wheeled);2 person hand held assist Transfers: Sit to/from [facility] Sit to Stand: +2 physical  assistance;Mod assist Stand pivot transfers: +2 physical assistance;Min assist       General transfer comment: frequent VC required for sequencing and positioning of RW  Ambulation/Gait                 Stairs             Wheelchair Mobility    Modified Rankin (Stroke Patients Only)       Balance Overall balance assessment: Needs assistance Sitting-balance support: Single extremity supported Sitting balance-Leahy Scale: Good     Standing balance support: Bilateral upper extremity supported Standing balance-Leahy Scale: Zero(if without UE support) Standing balance comment: able to stand with CGA bil UE on RW Single Leg Stance - Right Leg: 0             High Level Balance Comments: minA for small hops to turn from bed to chair, frequent VC and reminders for use of UE            Cognition Arousal/Alertness: Awake/alert Behavior During Therapy: WFL for tasks assessed/performed Overall Cognitive Status: Within Functional Limits for tasks assessed                                        Exercises      General Comments        Pertinent Vitals/Pain Pain Assessment: No/denies pain Pain Score: ("it's so sore") Pain  Location: Left stump Pain Descriptors / Indicators: Sore Pain Intervention(s): Monitored during session;Relaxation;Repositioned    Home Living Family/patient expects to be discharged to:: Assisted living             Home Equipment: Shower seat;Wheelchair - manual Additional Comments: pt has 3 daughters who live locally, 2 of whom are CNA/PCA.    Prior Function Level of Independence: Needs assistance  Gait / Transfers Assistance Needed: pt reports independence ADL's / Homemaking Assistance Needed: pt reports independence     PT Goals (current goals can now be found in the care plan section) Acute Rehab PT Goals Patient Stated Goal: be mobile again PT Goal Formulation: With patient Time For Goal Achievement:  04/14/19 Potential to Achieve Goals: Good    Frequency    Min 3X/week      PT Plan      Co-evaluation              AM-PAC PT "6 Clicks" Mobility   Outcome Measure  Help needed turning from your back to your side while in a flat bed without using bedrails?: None Help needed moving from lying on your back to sitting on the side of a flat bed without using bedrails?: A Little Help needed moving to and from a bed to a chair (including a wheelchair)?: A Lot Help needed standing up from a chair using your arms (e.g., wheelchair or bedside chair)?: A Lot Help needed to walk in hospital room?: A Lot Help needed climbing 3-5 steps with a railing? : Total 6 Click Score: 14    End of Session Equipment Utilized During Treatment: Gait belt Activity Tolerance: Patient tolerated treatment well;Patient limited by pain Patient left: in chair;with chair alarm set;with call bell/phone within reach   PT Visit Diagnosis: Unsteadiness on feet (R26.81);Other abnormalities of gait and mobility (R26.89);Repeated falls (R29.6);History of falling (Z91.81);Difficulty in walking, not elsewhere classified (R26.2);Pain Pain - Right/Left: Left Pain - part of body: Leg     Time: 3568-6168 PT Time Calculation (min) (ACUTE ONLY): 23 min  Charges:  $Therapeutic Activity: 8-22 mins $Self Care/Home Management: Bracey, DPT Acute Rehab [phone]

## 2019-04-02 ENCOUNTER — Encounter (HOSPITAL_COMMUNITY)
Admission: EM | Disposition: A | Discharge status: Skilled Nursing Facility | Payer: Self-pay | Source: Skilled Nursing Facility | Attending: Internal Medicine

## 2019-04-02 ENCOUNTER — Inpatient Hospital Stay (HOSPITAL_COMMUNITY): Payer: Medicare Other | Admitting: Anesthesiology

## 2019-04-02 DIAGNOSIS — T8781 Dehiscence of amputation stump: Secondary | ICD-10-CM

## 2019-04-02 HISTORY — PX: APPLICATION OF WOUND VAC: SHX5189

## 2019-04-02 HISTORY — PX: AMPUTATION: SHX166

## 2019-04-02 LAB — BASIC METABOLIC PANEL
Anion gap: 7 (ref 5–15)
BUN: 15 mg/dL (ref 6–20)
CO2: 25 mmol/L (ref 22–32)
Calcium: 8.5 mg/dL — ABNORMAL LOW (ref 8.9–10.3)
Chloride: 107 mmol/L (ref 98–111)
Creatinine, Ser: 0.79 mg/dL (ref 0.44–1.00)
GFR calc Af Amer: >60 mL/min (ref >60)
GFR calc non Af Amer: >60 mL/min (ref >60)
Glucose, Bld: 132 mg/dL — ABNORMAL HIGH (ref 70–99)
Potassium: 4.1 mmol/L (ref 3.5–5.1)
Sodium: 139 mmol/L (ref 135–145)

## 2019-04-02 LAB — CBC
HCT: 29.2 % — ABNORMAL LOW (ref 36.0–46.0)
Hemoglobin: 8.9 g/dL — ABNORMAL LOW (ref 12.0–15.0)
MCH: 30.2 pg (ref 26.0–34.0)
MCHC: 30.5 g/dL (ref 30.0–36.0)
MCV: 99 fL (ref 80.0–100.0)
Platelets: 341 10*3/uL (ref 150–400)
RBC: 2.95 MIL/uL — ABNORMAL LOW (ref 3.87–5.11)
RDW: 13.2 % (ref 11.5–15.5)
WBC: 11 10*3/uL — ABNORMAL HIGH (ref 4.0–10.5)
nRBC: 0.3 % — ABNORMAL HIGH (ref 0.0–0.2)

## 2019-04-02 LAB — PROTIME-INR
INR: 1.1 (ref 0.8–1.2)
Prothrombin Time: 14.4 seconds (ref 11.4–15.2)

## 2019-04-02 LAB — HEPARIN LEVEL (UNFRACTIONATED): Heparin Unfractionated: 0.65 IU/mL (ref 0.30–0.70)

## 2019-04-02 LAB — APTT: aPTT: 98 seconds — ABNORMAL HIGH (ref 24–36)

## 2019-04-02 SURGERY — AMPUTATION, ABOVE KNEE
Anesthesia: General | Laterality: Left

## 2019-04-02 MED ORDER — MIDAZOLAM HCL 2 MG/2ML IJ SOLN
INTRAMUSCULAR | Status: AC
  Filled 2019-04-02: qty 2

## 2019-04-02 MED ORDER — LIDOCAINE 2% (20 MG/ML) 5 ML SYRINGE
INTRAMUSCULAR | Status: AC
  Filled 2019-04-02: qty 5

## 2019-04-02 MED ORDER — ONDANSETRON HCL 4 MG/2ML IJ SOLN
INTRAMUSCULAR | Status: AC
  Filled 2019-04-02: qty 2

## 2019-04-02 MED ORDER — 0.9 % SODIUM CHLORIDE (POUR BTL) OPTIME
TOPICAL | Status: DC | PRN
  Administered 2019-04-02 (×2): 1000 mL

## 2019-04-02 MED ORDER — ONDANSETRON HCL 4 MG/2ML IJ SOLN
INTRAMUSCULAR | Status: DC | PRN
  Administered 2019-04-02: 4 mg via INTRAVENOUS

## 2019-04-02 MED ORDER — RIVAROXABAN 10 MG PO TABS
10.0000 mg | ORAL_TABLET | Freq: Every day | ORAL | Status: DC
  Administered 2019-04-03 – 2019-04-08 (×6): 10 mg via ORAL
  Filled 2019-04-02 (×6): qty 1

## 2019-04-02 MED ORDER — FENTANYL CITRATE (PF) 250 MCG/5ML IJ SOLN
INTRAMUSCULAR | Status: AC
  Filled 2019-04-02: qty 5

## 2019-04-02 MED ORDER — PROPOFOL 10 MG/ML IV BOLUS
INTRAVENOUS | Status: DC | PRN
  Administered 2019-04-02: 150 mg via INTRAVENOUS

## 2019-04-02 MED ORDER — FENTANYL CITRATE (PF) 100 MCG/2ML IJ SOLN
INTRAMUSCULAR | Status: DC | PRN
  Administered 2019-04-02: 100 ug via INTRAVENOUS

## 2019-04-02 MED ORDER — ADULT MULTIVITAMIN W/MINERALS CH
1.0000 | ORAL_TABLET | Freq: Every day | ORAL | Status: DC
  Administered 2019-04-02 – 2019-04-08 (×7): 1 via ORAL
  Filled 2019-04-02 (×10): qty 1

## 2019-04-02 MED ORDER — PHENYLEPHRINE 40 MCG/ML (10ML) SYRINGE FOR IV PUSH (FOR BLOOD PRESSURE SUPPORT)
PREFILLED_SYRINGE | INTRAVENOUS | Status: AC
  Filled 2019-04-02: qty 10

## 2019-04-02 MED ORDER — DEXAMETHASONE SODIUM PHOSPHATE 10 MG/ML IJ SOLN
INTRAMUSCULAR | Status: AC
  Filled 2019-04-02: qty 4

## 2019-04-02 MED ORDER — SUCCINYLCHOLINE CHLORIDE 200 MG/10ML IV SOSY
PREFILLED_SYRINGE | INTRAVENOUS | Status: AC
  Filled 2019-04-02: qty 10

## 2019-04-02 MED ORDER — HEPARIN (PORCINE) 25000 UT/250ML-% IV SOLN
1400.0000 [IU]/h | INTRAVENOUS | Status: AC
  Administered 2019-04-02: 1400 [IU]/h via INTRAVENOUS
  Filled 2019-04-02 (×2): qty 250

## 2019-04-02 MED ORDER — LIDOCAINE 2% (20 MG/ML) 5 ML SYRINGE
INTRAMUSCULAR | Status: DC | PRN
  Administered 2019-04-02: 60 mg via INTRAVENOUS

## 2019-04-02 MED ORDER — ONDANSETRON HCL 4 MG/2ML IJ SOLN: 4.0000 mg | Freq: Once | INTRAMUSCULAR | Status: DC | PRN

## 2019-04-02 MED ORDER — BACITRACIN ZINC 500 UNIT/GM EX OINT
TOPICAL_OINTMENT | CUTANEOUS | Status: AC
  Filled 2019-04-02: qty 28.35

## 2019-04-02 MED ORDER — DEXAMETHASONE SODIUM PHOSPHATE 10 MG/ML IJ SOLN
INTRAMUSCULAR | Status: AC
  Filled 2019-04-02: qty 1

## 2019-04-02 MED ORDER — PROPOFOL 10 MG/ML IV BOLUS
INTRAVENOUS | Status: AC
  Filled 2019-04-02: qty 20

## 2019-04-02 MED ORDER — FENTANYL CITRATE (PF) 100 MCG/2ML IJ SOLN: 25.0000 ug | INTRAMUSCULAR | Status: DC | PRN

## 2019-04-02 SURGICAL SUPPLY — 58 items
BANDAGE ACE 4X5 VEL STRL LF (GAUZE/BANDAGES/DRESSINGS) ×3 IMPLANT
BANDAGE ACE 6X5 VEL STRL LF (GAUZE/BANDAGES/DRESSINGS) ×3 IMPLANT
BANDAGE ESMARK 6X9 LF (GAUZE/BANDAGES/DRESSINGS) ×1 IMPLANT
BLADE SAW RECIP 87.9 MT (BLADE) ×3 IMPLANT
BNDG CMPR 9X6 STRL LF SNTH (GAUZE/BANDAGES/DRESSINGS) ×1
BNDG COHESIVE 6X5 TAN STRL LF (GAUZE/BANDAGES/DRESSINGS) ×3 IMPLANT
BNDG ESMARK 6X9 LF (GAUZE/BANDAGES/DRESSINGS) ×3
BNDG GAUZE ELAST 4 BULKY (GAUZE/BANDAGES/DRESSINGS) ×3 IMPLANT
CANISTER SUCT 3000ML PPV (MISCELLANEOUS) ×3 IMPLANT
COVER SURGICAL LIGHT HANDLE (MISCELLANEOUS) ×3 IMPLANT
COVER WAND RF STERILE (DRAPES) ×3 IMPLANT
CUFF TOURNIQUET SINGLE 18IN (TOURNIQUET CUFF) IMPLANT
CUFF TOURNIQUET SINGLE 24IN (TOURNIQUET CUFF) IMPLANT
CUFF TOURNIQUET SINGLE 34IN LL (TOURNIQUET CUFF) IMPLANT
CUFF TOURNIQUET SINGLE 44IN (TOURNIQUET CUFF) IMPLANT
DRAIN CHANNEL 19F RND (DRAIN) IMPLANT
DRAPE HALF SHEET 40X57 (DRAPES) ×3 IMPLANT
DRAPE ORTHO SPLIT 77X108 STRL (DRAPES) ×6
DRAPE SURG ORHT 6 SPLT 77X108 (DRAPES) ×2 IMPLANT
DRAPE U-SHAPE 47X51 STRL (DRAPES) ×3 IMPLANT
DRSG ADAPTIC 3X8 NADH LF (GAUZE/BANDAGES/DRESSINGS) ×3 IMPLANT
DRSG VAC ATS LRG SENSATRAC (GAUZE/BANDAGES/DRESSINGS) ×2 IMPLANT
ELECT CAUTERY BLADE 6.4 (BLADE) ×3 IMPLANT
ELECT REM PT RETURN 9FT ADLT (ELECTROSURGICAL) ×3
ELECTRODE REM PT RTRN 9FT ADLT (ELECTROSURGICAL) ×1 IMPLANT
EVACUATOR SILICONE 100CC (DRAIN) IMPLANT
GAUZE SPONGE 4X4 12PLY STRL (GAUZE/BANDAGES/DRESSINGS) ×3 IMPLANT
GLOVE BIO SURGEON STRL SZ7.5 (GLOVE) ×3 IMPLANT
GLOVE BIOGEL PI IND STRL 6.5 (GLOVE) IMPLANT
GLOVE BIOGEL PI IND STRL 7.5 (GLOVE) IMPLANT
GLOVE BIOGEL PI IND STRL 8 (GLOVE) ×1 IMPLANT
GLOVE BIOGEL PI INDICATOR 6.5 (GLOVE) ×2
GLOVE BIOGEL PI INDICATOR 7.5 (GLOVE) ×2
GLOVE BIOGEL PI INDICATOR 8 (GLOVE) ×2
GLOVE SURG SS PI 6.5 STRL IVOR (GLOVE) ×2 IMPLANT
GOWN STRL REUS W/ TWL LRG LVL3 (GOWN DISPOSABLE) ×3 IMPLANT
GOWN STRL REUS W/TWL LRG LVL3 (GOWN DISPOSABLE) ×9
KIT BASIN OR (CUSTOM PROCEDURE TRAY) ×3 IMPLANT
KIT TURNOVER KIT B (KITS) ×3 IMPLANT
NS IRRIG 1000ML POUR BTL (IV SOLUTION) ×3 IMPLANT
PACK GENERAL/GYN (CUSTOM PROCEDURE TRAY) ×3 IMPLANT
PAD ARMBOARD 7.5X6 YLW CONV (MISCELLANEOUS) ×6 IMPLANT
RASP HELIOCORDIAL MED (MISCELLANEOUS) IMPLANT
STAPLER VISISTAT (STAPLE) ×3 IMPLANT
STOCKINETTE IMPERVIOUS LG (DRAPES) ×3 IMPLANT
SUT ETHILON 3 0 PS 1 (SUTURE) IMPLANT
SUT PROLENE 5 0 C 1 24 (SUTURE) ×2 IMPLANT
SUT SILK 0 TIES 10X30 (SUTURE) ×3 IMPLANT
SUT SILK 2 0 (SUTURE) ×6
SUT SILK 2 0 SH CR/8 (SUTURE) ×5 IMPLANT
SUT SILK 2-0 18XBRD TIE 12 (SUTURE) ×1 IMPLANT
SUT SILK 3 0 (SUTURE) ×3
SUT SILK 3-0 18XBRD TIE 12 (SUTURE) ×1 IMPLANT
SUT VIC AB 2-0 CT1 18 (SUTURE) ×5 IMPLANT
TOWEL GREEN STERILE (TOWEL DISPOSABLE) ×6 IMPLANT
TOWEL GREEN STERILE FF (TOWEL DISPOSABLE) ×3 IMPLANT
UNDERPAD 30X30 (UNDERPADS AND DIAPERS) ×3 IMPLANT
WATER STERILE IRR 1000ML POUR (IV SOLUTION) ×3 IMPLANT

## 2019-04-02 NOTE — Progress Notes (Signed)
VAST RN consulted to place line as patient is receiving IV abx and will start Heparin drip this afternoon. Spoke with unit RN, Lauren who stated patient had a midline placed yesterday and it came out over night. Pt reported she must have pulled it out accidentally in her sleep.

## 2019-04-02 NOTE — Progress Notes (Signed)
Pt arrived back from PACU. Wound vac to left AKA. Pt still drowsy, but responds to voice. Vitals obtained. Snack provided, per pt request. Will continue to monitor.

## 2019-04-02 NOTE — TOC Initial Note (Signed)
Transition of Care [facility]) - Initial/Assessment Note    Patient Details  Name: Alisha [name] MRN: [id number] Date of Birth: July 02, 1961  Transition of Care [facility] CM/SW Contact:    Gelene Mink, [place] Phone Number: 04/02/2019, 1:54 PM  Clinical Narrative:              CSW spoke with patient's daughter about the patient. CSW explained that PT/OT had recommended SNF, CSW asked if she would be able to assist her mother with making a choice for a facility. Patient's daughter stated that she has tried to be a support person for her mother.  She disclosed that the patient is currently a drug user and is receiving drugs at her assisted living facility. She stated that her mother keeps picking her wound and it is not healing. She stated that the facility might be ready to through her mother out.  Patient's daughter stated that her mother does not want her involved and she isn't allowed to speak with her while being in the hospital.   She stated that her mother needs help addressing her drug use before she should go to rehab. Patient's daughter stated that she is not able to help her mother at this time.     CSW called and spoke with the patient about skilled nursing. CSW shared that her daughter stated that she used drugs and CSW shared that she would not be able to use drugs at the facility. She stated that she went to [facility]. She stated that she is going to get up and walk again but was declining SNF at this time. She hung up the phone on the CSW. Patient was irate and not willing to speak with the CSW.   Expected Discharge Plan: Skilled Nursing Facility Barriers to Discharge: Continued Medical Work up, No SNF bed   Patient Goals and CMS Choice Patient states their goals for this hospitalization and ongoing recovery are:: No goal was stated   Choice offered to / list presented to : NA  Expected Discharge Plan and Services Expected Discharge Plan: [place] In-house Referral: Clinical Social Work Discharge Planning Services: Other - See comment(Will provide substance abuse resources) Post Acute Care Choice: NA Living arrangements for the past 2 months: [place] Expected Discharge Date: 03/31/19               DME Arranged: N/A DME Agency: NA HH Arranged: NA    Prior Living Arrangements/Services Living arrangements for the past 2 months: [place] Lives with:: Facility Resident Patient language and need for interpreter reviewed:: No Do you feel safe going back to the place where you live?: Yes      Need for Family Participation in Patient Care: No (Comment) Care giver support system in place?: No (comment)   Criminal Activity/Legal Involvement Pertinent to Current Situation/Hospitalization: No - Comment as needed  Activities of Daily Living   ADL Screening (condition at time of admission) Patient's cognitive ability adequate to safely complete daily activities?: Yes Is the patient deaf or have difficulty hearing?: No Does the patient have difficulty concentrating, remembering, or making decisions?: No Patient able to express need for assistance with ADLs?: Yes Does the patient have difficulty dressing or bathing?: No Independently performs ADLs?: Yes (appropriate for developmental age) Does the patient have difficulty walking or climbing stairs?: Yes Weakness of Legs: None Weakness of Arms/Hands: None  Permission Sought/Granted Permission sought to share information with : Case Manager Permission granted  to share information with : No              Emotional Assessment Appearance:: Appears stated age Attitude/Demeanor/Rapport: Inconsistent Affect (typically observed): Unable to Assess Orientation: : Oriented to Self, Oriented to  Time, Oriented to Situation Alcohol / Substance Use: Not Applicable Psych Involvement: No (comment)  Admission diagnosis:  knee infection Patient Active  Problem List   Diagnosis Date Noted  . 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 Chronic combined systolic and diastolic CHF (congestive heart failure) ([employer]) 03/29/2019  . Gangrene of left foot ([place]) 02/14/2019  . Loss of consciousness ([place]) 02/15/2018  . Asthma 10/07/2016  . Colonic diverticular abscess   . Anxiety 08/28/2014  . Breast pain, left 08/11/2014  . Abnormal ECG 01/28/2014  . Health care maintenance 09/16/2013  . Metrorrhagia 10/12/2012  . Endometrial mass 10/12/2012  . 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 Osteoarthritis of both knees 02/15/2012  . Chronic pain syndrome 07/05/2010  . Venous thromboembolism 02/22/2010  . Depression with anxiety 07/17/2009  . 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 TOBACCO ABUSE 10/20/2006  . Essential hypertension 10/20/2006  . Peripheral vascular disease ([place]) 10/20/2006  . HYPERLIPIDEMIA, MIXED 10/10/2006   PCP:  Nolene Ebbs, MD Pharmacy:   [facility] [address], [place] [address] Phone: [phone] Fax: [place], [place] [address] Phone: [phone] Fax: [place], [place] [address] Phone: [phone] Fax: [phone]  [place], [place] - [place] [address] Phone: [phone] Fax: [place], [place] [address] [place] [address] Phone: [phone] Fax: [phone]     Social Determinants of Health (SDOH) Interventions    Readmission Risk Interventions No flowsheet data found.

## 2019-04-02 NOTE — Transfer of Care (Signed)
Immediate Anesthesia Transfer of Care Note  Patient: Alisha [name]  Procedure(s) Performed: IRRIGATION AND DEBRIDEMENT LEFT ABOVE KNEE AMPUTATION (Left ) APPLICATION OF WOUND VAC TO LEFT KNEE AMPUTATION (Left )  Patient Location: PACU  Anesthesia Type:General  Level of Consciousness: sedated  Airway & Oxygen Therapy: Patient Spontanous Breathing and Patient connected to face mask oxygen  Post-op Assessment: Report given to RN, Post -op Vital signs reviewed and stable and Patient moving all extremities X 4  Post vital signs: Reviewed and stable  Last Vitals:  Vitals Value Taken Time  BP 172/91 04/02/2019  8:41 AM  Temp    Pulse 92 04/02/2019  8:42 AM  Resp 17 04/02/2019  8:42 AM  SpO2 100 % 04/02/2019  8:42 AM  Vitals shown include unvalidated device data.  Last Pain:  Vitals:   04/02/19 0426  TempSrc:   PainSc: Asleep      Patients Stated Pain Goal: 5 (12/15/23 4695)  Complications: No apparent anesthesia complications

## 2019-04-02 NOTE — Progress Notes (Signed)
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 She was seen in the emergency department after this fall, the site appeared to be intact per documentation, she was given a small fluid bolus for low blood pressure, and returned to her nursing facility. Since that time, there has been increasing pain and increasingly purulent and malodorous drainage.  ED Course:Upon arrival to the ED, patient is found to be afebrile, saturating well on room air, hypotensive initially with blood pressure 86/65, and normal heart rate and respirations.EKG features a sinus rhythm. Chemistry panel is unremarkable and CBC notable for leukocytosis to 12,000. Lactic acid is reassuringly normal. Patient was given 500 cc normal saline, fentanyl, vancomycin, Zosyn, and clindamycin in the ED. Vascular surgery was consulted and is taking the patient to the OR for debridement and washout, anticipates need for additional debridements during this hospitalization, and recommends admission to the medical service.  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 Follow final cultures Status post excisional debridement of the left above-knee amputation and placement of wound VAC4/3  History of DVT on Xarelto: Continue to hold off Xarelto Continue heparin drip until vascular surgery has completed all procedures Pharmacy managing heparin drip  Chronic combined systolic and diastolic CHF: -Last 2D echo done on in February 2019 revealed LVEF 35 to 40% with grade 1 diastolic dysfunction -Stable.  No CHF symptoms.    Severe peripheral vascular disease status post left above-the-knee amputation: Continue aspirin, Lipitor  COPD: Stable.    Chronic tobacco use disorder: Counseled.    Hypertension Optimized. Continue to monitor vital signs  Physical debility/ambulatory dysfunction PT to assess Fall precautions (avoid hypotensive episodes)  DVT prophylaxis: Heparin drip  Code Status:Full Family Communication: None at bedside Consults called:Vascular surgery  Estimated body mass index is 32.28 kg/m as calculated from the following:   Height as of this encounter: 5\' 4"  (1.626 m).   Weight as of this encounter: 85.3 kg.   Subjective: C/O BEING SORE AND HUNGRY  Objective: Vitals:   04/02/19 0850 04/02/19 0854 04/02/19 0933 04/02/19 1025  BP: 136/89 110/86 (!) 150/103 (!) 140/93  Pulse:  93 81 85  Resp:  20 10 11   Temp:      TempSrc:      SpO2:  92% 93% 96%  Weight:      Height:        Intake/Output Summary (Last 24 hours) at 04/02/2019 1131 Last data filed at 04/02/2019 1043  Gross per 24 hour  Intake 1946.65 ml  Output 205 ml  Net 1741.65 ml   Filed Weights   03/31/19 0500 04/01/19 0618 04/02/19 0423  Weight: 84.1 kg 85.4 kg 85.3 kg     Examination:  General exam: Appears calm and comfortable  Respiratory system: Clear to auscultation. Respiratory effort normal. Cardiovascular system: S1 & S2 heard, RRR. No JVD, murmurs, rubs, gallops or clicks. No pedal edema. Gastrointestinal system: Abdomen is nondistended, soft and nontender. No organomegaly or masses felt. Normal bowel sounds heard. Central nervous system: Alert and oriented. No focal neurological deficits. Extremities: LEFT AKA STUMP VAC IN PLACE Skin: No rashes, lesions or ulcers Psychiatry: Judgement and insight appear normal. Mood & affect appropriate.     Data Reviewed: I have personally reviewed following labs and imaging studies  CBC: Recent Labs  Lab 03/29/19 1200 03/30/19 0323 03/31/19 0740 04/02/19 0236  WBC 12.0* 10.3 11.4* 11.0*  NEUTROABS 7.9*  --   --   --   HGB 11.3* 9.9* 9.3* 8.9*  HCT 37.1 31.1* 30.0* 29.2*  MCV 99.7 98.1 97.4 99.0  PLT 380 313 334 409   Basic Metabolic Panel: Recent Labs  Lab 03/29/19 1200 03/30/19 0323 04/02/19 0236  NA 137 137 139  K 4.3 4.4 4.1  CL 103 104 107  CO2 23 25 25   GLUCOSE 100* 137* 132*  BUN 21* 20 15  CREATININE 1.09* 0.97 0.79  CALCIUM 9.2 8.5* 8.5*   GFR: Estimated Creatinine Clearance: 82.9 mL/min (by C-G formula based on SCr of 0.79 mg/dL). Liver Function Tests: Recent Labs  Lab 03/29/19 1200  AST 18  ALT 15  ALKPHOS 68  BILITOT 0.8  PROT 7.9  ALBUMIN 3.1*   No results for input(s): LIPASE, AMYLASE in the last 168 hours. No results for input(s): AMMONIA in the last 168 hours. Coagulation Profile: Recent Labs  Lab 04/02/19 0236  INR 1.1   Cardiac Enzymes: No results for input(s): CKTOTAL, CKMB, CKMBINDEX, TROPONINI in the last 168 hours. BNP (last 3 results) No results for input(s): PROBNP in the last 8760 hours. HbA1C: No results for input(s): HGBA1C in the last 72 hours. CBG: Recent Labs  Lab 03/30/19 0850 03/31/19 0637  GLUCAP 106* 112*   Lipid Profile: No  results for input(s): CHOL, HDL, LDLCALC, TRIG, CHOLHDL, LDLDIRECT in the last 72 hours. Thyroid Function Tests: No results for input(s): TSH, T4TOTAL, FREET4, T3FREE, THYROIDAB in the last 72 hours. Anemia Panel: No results for input(s): VITAMINB12, FOLATE, FERRITIN, TIBC, IRON, RETICCTPCT in the last 72 hours. Sepsis Labs: Recent Labs  Lab 03/29/19 1200 03/29/19 2005  LATICACIDVEN 1.4 1.1    Recent Results (from the past 240 hour(s))  Wound or Superficial Culture     Status: Abnormal   Collection Time: 03/29/19 12:12 PM  Result Value Ref Range Status   Specimen Description WOUND LEFT LEG  Final   Special Requests NONE  Final   Gram Stain   Final    RARE WBC PRESENT, PREDOMINANTLY PMN ABUNDANT GRAM POSITIVE COCCI ABUNDANT GRAM NEGATIVE RODS ABUNDANT GRAM POSITIVE RODS Performed at [facility], [address] [place], [place] [address]    Culture MULTIPLE ORGANISMS PRESENT, NONE PREDOMINANT (A)  Final   Report Status 03/31/2019 FINAL  Final         Radiology Studies: No results found.      Scheduled Meds: . aspirin EC  81 mg Oral Daily  . atorvastatin  40 mg Oral QHS  . DULoxetine  60 mg Oral BID  . gabapentin  1,600 mg Oral QHS  . gabapentin  800 mg Oral BID  . hydrOXYzine  50 mg Oral QHS  . multivitamin with minerals  1 tablet Oral Daily  . [START ON 04/03/2019] rivaroxaban  10 mg Oral Daily  . sodium chloride flush  3 mL Intravenous Q12H  . sodium chloride flush  3 mL Intravenous Q12H   Continuous Infusions: . sodium chloride    . heparin    . piperacillin-tazobactam (ZOSYN)  IV 3.375 g (04/02/19 0418)  . vancomycin Stopped (04/01/19 1851)     LOS: 4 days     Georgette Shell, MD [facility]  If 7PM-7AM, please contact night-coverage [email or web address] Password [facility] 04/02/2019, 11:31 AM

## 2019-04-02 NOTE — Anesthesia Procedure Notes (Signed)
Procedure Name: Intubation Date/Time: 04/02/2019 7:52 AM Performed by: Neldon Newport, CRNA Pre-anesthesia Checklist: Timeout performed, Patient being monitored, Suction available, Emergency Drugs available and Patient identified Patient Re-evaluated:Patient Re-evaluated prior to induction Oxygen Delivery Method: Circle system utilized Preoxygenation: Pre-oxygenation with 100% oxygen Induction Type: IV induction Ventilation: Mask ventilation without difficulty Laryngoscope Size: Mac and 3 Grade View: Grade I Tube type: Oral Tube size: 7.0 mm Number of attempts: 1 Placement Confirmation: breath sounds checked- equal and bilateral,  positive ETCO2 and ETT inserted through vocal cords under direct vision Secured at: 21 cm Tube secured with: Tape Dental Injury: Teeth and Oropharynx as per pre-operative assessment

## 2019-04-02 NOTE — Progress Notes (Deleted)
CSW spoke with patient's daughter about the patient. CSW explained that PT/OT had recommended SNF, CSW asked if she would be able to assist her mother with making a choice for a facility. Patient's daughter stated that she has tried to be a support person for her mother.  She disclosed that the patient is currently a drug user and is receiving drugs at her assisted living facility. She stated that her mother keeps picking her wound and it is not healing. She stated that the facility might be ready to through her mother out.  Patient's daughter stated that her mother does not want her involved and she isn't allowed to speak with her while being in the hospital.   She stated that her mother needs help addressing her drug use before she should go to rehab. Patient's daughter stated that she is not able to help her mother at this time.   Domenic Schwab, MSW, [place]

## 2019-04-02 NOTE — Anesthesia Postprocedure Evaluation (Signed)
Anesthesia Post Note  Patient: [place]  Procedure(s) Performed: IRRIGATION AND DEBRIDEMENT LEFT ABOVE KNEE AMPUTATION (Left ) APPLICATION OF WOUND VAC TO LEFT KNEE AMPUTATION (Left )     Patient location during evaluation: PACU Anesthesia Type: General Level of consciousness: awake and alert Pain management: pain level controlled Vital Signs Assessment: post-procedure vital signs reviewed and stable Respiratory status: spontaneous breathing, nonlabored ventilation, respiratory function stable and patient connected to nasal cannula oxygen Cardiovascular status: blood pressure returned to baseline and stable Postop Assessment: no apparent nausea or vomiting Anesthetic complications: no    Last Vitals:  Vitals:   04/02/19 0933 04/02/19 1025  BP: (!) 150/103 (!) 140/93  Pulse: 81 85  Resp: 10 11  Temp:    SpO2: 93% 96%    Last Pain:  Vitals:   04/02/19 1152  TempSrc:   PainSc: Asleep                 Inara Dike P Eriyah Fernando

## 2019-04-02 NOTE — Op Note (Signed)
    NAME: [name]    MRN: [id number] DOB: 1961/02/24    DATE OF OPERATION: 04/02/2019  PREOP DIAGNOSIS:    Nonhealing left above-the-knee amputation  POSTOP DIAGNOSIS:    Same  PROCEDURE:    Excisional debridement of left above-the-knee amputation (skin, subcutaneous tissue, and fascia) Placement of VAC  SURGEON: Judeth Cornfield. Scot Dock, MD, FACS  ASSIST: None  ANESTHESIA: General  EBL: Minimal  INDICATIONS:    Alisha [name] is a 58 y.o. female who had an above-the-knee amputation almost 2 months ago.  She had not presented for follow-up and subsequently presented with dehiscence and infection in her above-the-knee amputation.  This was debrided and then dressing changes were done.  This point it was felt that the wound was clean enough to debride and place a VAC.  FINDINGS:   Wound was clean and had good perfusion  TECHNIQUE:   The patient was taken to the operating room and received a general anesthetic.  The left above-the-knee amputation and wound were prepped and draped in usual sterile fashion.  Using scissors and a scalpel I excised devitalized skin, subcutaneous tissue, and fascia down to healthy bleeding tissue.  I also scrubbed the wound with a scrub brush and there was good perfusion.  There was one area that was bleeding despite cauterizing this and a 5-0 Prolene was placed here.  At the completion the wound measured 2.5 cm in depth, 7 cm in width, and 19 cm in length.  A VAC was then placed without difficulty.  There was a good seal.  Alisha Mayo, MD, [facility]  DATE OF DICTATION:   04/02/2019

## 2019-04-02 NOTE — Progress Notes (Signed)
[place] for Heparin Indication: VTE treatment  Allergies  Allergen Reactions  . Propoxyphene N-Acetaminophen Anaphylaxis and Swelling  . Strawberry Extract Anaphylaxis and Shortness Of Breath    "I go into shock"  . Buspar [Buspirone] Swelling    "Swells my face"  . Grapefruit Extract Other (See Comments)    Interaction with medications she's taking  . Tape Itching    Patient says that it itches and swells with some adhesive dressings.    Patient Measurements: Height: 5\' 4"  (162.6 cm) Weight: 188 lb 0.8 oz (85.3 kg) IBW/kg (Calculated) : 54.7 Heparin Dosing Weight: 72.6 kg  Vital Signs: Temp: 97.6 F (36.4 C) (04/03 0838) Temp Source: Oral (04/03 0423) BP: 150/103 (04/03 0933) Pulse Rate: 81 (04/03 0933)  Labs: Recent Labs    03/31/19 0740 03/31/19 1755 04/01/19 0601 04/02/19 0236  HGB 9.3*  --   --  8.9*  HCT 30.0*  --   --  29.2*  PLT 334  --   --  341  APTT 102* 95* 94* 98*  LABPROT  --   --   --  14.4  INR  --   --   --  1.1  HEPARINUNFRC 0.79* 0.83* 0.62 0.65  CREATININE  --   --   --  0.79    Estimated Creatinine Clearance: 82.9 mL/min (by C-G formula based on SCr of 0.79 mg/dL).  Assessment: 49 yof with hx of L AKA, presenting with foul smelling, thin yellow drainage, and some granulation necrotic skin edges. On Xarelto PTA. She is s/p debridement L AKA and now s/p wound vanc placement this am  Anticoag: Xarelto for hx of DVT  (last dose 3/29 per med recc but Rx chart note indicates pt ran out of med). PTA dose 10mg /d.   Renal: Scr 0.79  Heme/Onc: H&H 8.9/29.2, Plt 341  Goal of Therapy:  Heparin level 0.3-0.7 units/ml Monitor platelets by anticoagulation protocol: Yes   Plan: Resume heparin 1400 units/hr @ 1600 HL 0000 Daily HL CBC DC heparin and return to xarelto 10 mg daily -  Sat am @ [place], PharmD, BCPS, BCCCP Clinical Pharmacist [phone]  Please check AMION for all [place]  numbers  04/02/2019 10:01 AM

## 2019-04-02 NOTE — Progress Notes (Signed)
More awake and appropriately oriented x 3.  Sitting uipright in bed, denies pain.

## 2019-04-02 NOTE — Anesthesia Preprocedure Evaluation (Addendum)
Anesthesia Evaluation  Patient identified by MRN, date of birth, ID band Patient awake    Reviewed: Allergy & Precautions, NPO status , Patient's Chart, lab work & pertinent test results  Airway Mallampati: III  TM Distance: >3 FB Neck ROM: Full    Dental  (+) Poor Dentition, Missing   Pulmonary asthma , Current Smoker,    Pulmonary exam normal        Cardiovascular hypertension, Pt. on home beta blockers and Pt. on medications + Peripheral Vascular Disease, +CHF and + DVT  Normal cardiovascular exam  ECG: SR, rate 82  ECHO: LV EF: 35% -   40%   Neuro/Psych PSYCHIATRIC DISORDERS Anxiety Depression Bipolar Disorder  Neuromuscular disease    GI/Hepatic negative GI ROS, Neg liver ROS,   Endo/Other  negative endocrine ROS  Renal/GU negative Renal ROS     Musculoskeletal  (+) Arthritis , Fibromyalgia -Chronic pain syndrome Restless leg syndrome    Abdominal (+) + obese,   Peds  Hematology  (+) anemia , HLD   Anesthesia Other Findings LEFT ABOVE KNEE AMPUTATION WOUND DEHISCENCE  Reproductive/Obstetrics                            Anesthesia Physical Anesthesia Plan  ASA: III  Anesthesia Plan: General   Post-op Pain Management:    Induction: Intravenous  PONV Risk Score and Plan: 2 and Ondansetron, Dexamethasone and Treatment may vary due to age or medical condition  Airway Management Planned: Oral ETT  Additional Equipment:   Intra-op Plan:   Post-operative Plan: Extubation in OR  Informed Consent: I have reviewed the patients History and Physical, chart, labs and discussed the procedure including the risks, benefits and alternatives for the proposed anesthesia with the patient or authorized representative who has indicated his/her understanding and acceptance.     Dental advisory given  Plan Discussed with: CRNA  Anesthesia Plan Comments:        Anesthesia Quick  Evaluation

## 2019-04-02 NOTE — Interval H&P Note (Signed)
History and Physical Interval Note:  04/02/2019 7:27 AM  Alisha [name]  has presented today for surgery, with the diagnosis of LEFT ABOVE [place].  The various methods of treatment have been discussed with the patient and family. After consideration of risks, benefits and other options for treatment, the patient has consented to  Procedure(s): IRRIGATION AND DEBRIDEMENT LEFT ABOVE KNEE AMPUTATION POSSIBLE REVISION (Left) as a surgical intervention.  The patient's history has been reviewed, patient examined, no change in status, stable for surgery.  I have reviewed the patient's chart and labs.  Questions were answered to the patient's satisfaction.     [name]

## 2019-04-03 ENCOUNTER — Encounter (HOSPITAL_COMMUNITY): Payer: Self-pay | Admitting: Vascular Surgery

## 2019-04-03 LAB — CBC WITH DIFFERENTIAL/PLATELET
Abs Immature Granulocytes: 0.24 10*3/uL — ABNORMAL HIGH (ref 0.00–0.07)
Basophils Absolute: 0.1 10*3/uL (ref 0.0–0.1)
Basophils Relative: 0 %
Eosinophils Absolute: 0.1 10*3/uL (ref 0.0–0.5)
Eosinophils Relative: 0 %
HCT: 31.1 % — ABNORMAL LOW (ref 36.0–46.0)
Hemoglobin: 9.4 g/dL — ABNORMAL LOW (ref 12.0–15.0)
Immature Granulocytes: 2 %
Lymphocytes Relative: 20 %
Lymphs Abs: 3.1 10*3/uL (ref 0.7–4.0)
MCH: 30.1 pg (ref 26.0–34.0)
MCHC: 30.2 g/dL (ref 30.0–36.0)
MCV: 99.7 fL (ref 80.0–100.0)
Monocytes Absolute: 1.5 10*3/uL — ABNORMAL HIGH (ref 0.1–1.0)
Monocytes Relative: 10 %
Neutro Abs: 10.7 10*3/uL — ABNORMAL HIGH (ref 1.7–7.7)
Neutrophils Relative %: 68 %
Platelets: 367 10*3/uL (ref 150–400)
RBC: 3.12 MIL/uL — ABNORMAL LOW (ref 3.87–5.11)
RDW: 13.3 % (ref 11.5–15.5)
WBC: 15.6 10*3/uL — ABNORMAL HIGH (ref 4.0–10.5)
nRBC: 0.2 % (ref 0.0–0.2)

## 2019-04-03 LAB — BASIC METABOLIC PANEL
Anion gap: 9 (ref 5–15)
BUN: 14 mg/dL (ref 6–20)
CO2: 24 mmol/L (ref 22–32)
Calcium: 8.7 mg/dL — ABNORMAL LOW (ref 8.9–10.3)
Chloride: 108 mmol/L (ref 98–111)
Creatinine, Ser: 0.73 mg/dL (ref 0.44–1.00)
GFR calc Af Amer: >60 mL/min (ref >60)
GFR calc non Af Amer: >60 mL/min (ref >60)
Glucose, Bld: 134 mg/dL — ABNORMAL HIGH (ref 70–99)
Potassium: 4.3 mmol/L (ref 3.5–5.1)
Sodium: 141 mmol/L (ref 135–145)

## 2019-04-03 LAB — VANCOMYCIN, PEAK: Vancomycin Pk: 28 ug/mL — ABNORMAL LOW (ref 30–40)

## 2019-04-03 LAB — HEPARIN LEVEL (UNFRACTIONATED): Heparin Unfractionated: 0.56 IU/mL (ref 0.30–0.70)

## 2019-04-03 LAB — GLUCOSE, CAPILLARY: Glucose-Capillary: 136 mg/dL — ABNORMAL HIGH (ref 70–99)

## 2019-04-03 MED ORDER — HYDROCODONE-ACETAMINOPHEN 5-325 MG PO TABS
1.0000 | ORAL_TABLET | ORAL | Status: DC | PRN
  Administered 2019-04-03 – 2019-04-08 (×19): 2 via ORAL
  Filled 2019-04-03 (×20): qty 2

## 2019-04-03 NOTE — Progress Notes (Addendum)
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 She was seen in the emergency department after this fall, the site appeared to be intact per documentation, she was given a small fluid bolus for low blood pressure, and returned to her nursing facility. Since that time, there has been increasing pain and increasingly purulent and malodorous drainage.  ED Course:Upon arrival to the ED, patient is found to be afebrile, saturating well on room air, hypotensive initially with blood pressure 86/65, and normal heart rate and respirations.EKG features a sinus rhythm. Chemistry panel is unremarkable and CBC notable for leukocytosis to 12,000. Lactic acid is reassuringly normal. Patient was given 500 cc normal saline, fentanyl, vancomycin, Zosyn, and clindamycin in the ED. Vascular surgery was consulted and is taking the patient to the OR for debridement and washout, anticipates need for additional debridements during this hospitalization, and recommends admission to the medical service.  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 Follow final cultures Status post excisional debridement of the left above-knee amputation and placement of wound VAC4/3 Reviewed CSW note regarding patient refusal to go to SNF.  Patient has gone to [facility] before.  There is a concern that she is using drugs or continues to use drugs as an outpatient.  Patient cannot be discharged home fully under this circumstances.  She will have to be discharged to SNF.  CONTINUE pain management outlined by vascular surgery.  History of DVT on Xarelto: Continue to hold off Xarelto Continue heparin drip until vascular surgery has completed all procedures Pharmacy managing heparin drip  Chronic combined systolic and diastolic CHF: -Last 2D echo done on in February 2019 revealed LVEF 35 to 40% with grade 1 diastolic dysfunction -Stable. No CHF symptoms.   Severe peripheral vascular disease status post left above-the-knee amputation: Continue aspirin, Lipitor  COPD: Stable.  Chronic tobacco use disorder: Counseled.   Hypertension Optimized. Continue to monitor vital signs  Physical debility/ambulatory dysfunction PT to assess Fall precautions (avoid hypotensive episodes)  DVT prophylaxis:Heparin drip  Code Status:Full Family Communication:None at bedside Consults called:Vascular surgery  Estimated body mass index is 32.77 kg/m as calculated from the following:   Height as of this encounter: 5\' 4"  (1.626 m).   Weight as of this encounter: 86.6 kg.    Subjective: Complains of the wound VAC being too tight Objective: Vitals:   04/02/19 2039 04/03/19 0003 04/03/19 0404 04/03/19 0847  BP: 112/79 114/74 125/87 108/74  Pulse: 87 89 83 86   Resp: 15 14 (!) 9 12  Temp: 98.1 F (36.7 C) 97.6 F (36.4 C) 98.1 F (36.7 C) 97.7 F (36.5 C)  TempSrc: Oral Oral Oral Oral  SpO2: 95% 98% 92% 98%  Weight:   86.6 kg   Height:        Intake/Output Summary (Last 24 hours) at 04/03/2019 0856 Last data filed at 04/03/2019 0400 Gross per 24 hour  Intake 636.76 ml  Output 475 ml  Net 161.76 ml   Filed Weights   04/01/19 0618 04/02/19 0423 04/03/19 0404  Weight: 85.4 kg 85.3 kg 86.6 kg    Examination:  General exam: Appears calm and comfortable  Respiratory system: Clear to auscultation. Respiratory effort normal. Cardiovascular system: S1 & S2 heard, RRR. No JVD, murmurs, rubs, gallops or clicks. No pedal edema. Gastrointestinal system: Abdomen is nondistended, soft and nontender. No organomegaly or masses felt. Normal bowel sounds heard. Central nervous system: Alert and oriented. No focal neurological deficits. Extremities LEFT AKA VAC IN PLACE  Skin: No rashes, lesions or ulcers Psychiatry: Judgement and insight appear normal. Mood & affect appropriate.     Data Reviewed: I have personally reviewed following labs and imaging studies  CBC: Recent Labs  Lab 03/29/19 1200 03/30/19 0323 03/31/19 0740 04/02/19 0236 04/03/19 0220  WBC 12.0* 10.3 11.4* 11.0* 15.6*  NEUTROABS 7.9*  --   --   --  10.7*  HGB 11.3* 9.9* 9.3* 8.9* 9.4*  HCT 37.1 31.1* 30.0* 29.2* 31.1*  MCV 99.7 98.1 97.4 99.0 99.7  PLT 380 313 334 341 299   Basic Metabolic Panel: Recent Labs  Lab 03/29/19 1200 03/30/19 0323 04/02/19 0236 04/03/19 0220  NA 137 137 139 141  K 4.3 4.4 4.1 4.3  CL 103 104 107 108  CO2 23 25 25 24   GLUCOSE 100* 137* 132* 134*  BUN 21* 20 15 14   CREATININE 1.09* 0.97 0.79 0.73  CALCIUM 9.2 8.5* 8.5* 8.7*   GFR: Estimated Creatinine Clearance: 83.7 mL/min (by C-G formula based on SCr of 0.73 mg/dL). Liver Function Tests: Recent Labs  Lab 03/29/19 1200  AST 18  ALT 15  ALKPHOS 68  BILITOT 0.8  PROT 7.9   ALBUMIN 3.1*   No results for input(s): LIPASE, AMYLASE in the last 168 hours. No results for input(s): AMMONIA in the last 168 hours. Coagulation Profile: Recent Labs  Lab 04/02/19 0236  INR 1.1   Cardiac Enzymes: No results for input(s): CKTOTAL, CKMB, CKMBINDEX, TROPONINI in the last 168 hours. BNP (last 3 results) No results for input(s): PROBNP in the last 8760 hours. HbA1C: No results for input(s): HGBA1C in the last 72 hours. CBG: Recent Labs  Lab 03/30/19 0850 03/31/19 0637 04/03/19 0642  GLUCAP 106* 112* 136*   Lipid Profile: No results for input(s): CHOL, HDL, LDLCALC, TRIG, CHOLHDL, LDLDIRECT in the last 72 hours. Thyroid Function Tests: No results for input(s): TSH, T4TOTAL, FREET4, T3FREE, THYROIDAB in the last 72 hours. Anemia Panel: No results for input(s): VITAMINB12, FOLATE, FERRITIN, TIBC, IRON, RETICCTPCT in the last 72 hours. Sepsis Labs: Recent Labs  Lab 03/29/19 1200 03/29/19 2005  LATICACIDVEN 1.4 1.1    Recent Results (from the past 240 hour(s))  Wound or Superficial Culture     Status: Abnormal   Collection Time: 03/29/19 12:12 PM  Result Value Ref Range Status   Specimen Description WOUND LEFT LEG  Final   Special Requests NONE  Final   Gram Stain  Final    RARE WBC PRESENT, PREDOMINANTLY PMN ABUNDANT GRAM POSITIVE COCCI ABUNDANT GRAM NEGATIVE RODS ABUNDANT GRAM POSITIVE RODS Performed at [facility], [place] [address], [place], [place] [address]    Culture MULTIPLE ORGANISMS PRESENT, NONE PREDOMINANT (A)  Final   Report Status 03/31/2019 FINAL  Final         Radiology Studies: No results found.      Scheduled Meds: . aspirin EC  81 mg Oral Daily  . atorvastatin  40 mg Oral QHS  . DULoxetine  60 mg Oral BID  . gabapentin  1,600 mg Oral QHS  . gabapentin  800 mg Oral BID  . hydrOXYzine  50 mg Oral QHS  . multivitamin with minerals  1 tablet Oral Daily  . rivaroxaban  10 mg Oral Daily  . sodium chloride flush  3  mL Intravenous Q12H  . sodium chloride flush  3 mL Intravenous Q12H   Continuous Infusions: . sodium chloride    . heparin 1,400 Units/hr (04/03/19 0400)  . piperacillin-tazobactam (ZOSYN)  IV 3.375 g (04/03/19 0553)  . vancomycin Stopped (04/02/19 2012)     LOS: 5 days     Georgette Shell, MD [facility]  If 7PM-7AM, please contact night-coverage [email or web address] Password [facility] 04/03/2019, 8:56 AM

## 2019-04-03 NOTE — Progress Notes (Signed)
ANTICOAGULATION CONSULT NOTE - Follow Up Consult  Pharmacy Consult for heparin Indication: VTE  Labs: Recent Labs    03/31/19 0740 03/31/19 1755 04/01/19 0601 04/02/19 0236 04/02/19 2342  HGB 9.3*  --   --  8.9*  --   HCT 30.0*  --   --  29.2*  --   PLT 334  --   --  341  --   APTT 102* 95* 94* 98*  --   LABPROT  --   --   --  14.4  --   INR  --   --   --  1.1  --   HEPARINUNFRC 0.79* 0.83* 0.62 0.65 0.56  CREATININE  --   --   --  0.79  --     Assessment/Plan:  58yo female therapeutic on heparin after resumed. Will continue gtt at current rate until Xarelto resumed in the am.   Wynona Neat, PharmD, BCPS  04/03/2019,12:35 AM

## 2019-04-03 NOTE — Progress Notes (Signed)
Physical Therapy Treatment Patient Details Name: Alisha [name] MRN: [id number] DOB: 09-Nov-1961 Today's Date: 04/03/2019    History of Present Illness 58 yo F presents to ED after fall from [place] c/o stump pain and drainage. h/o PVD, bipolar disorder, CHF, anxiety/depression, fibromyalgia, asthma, HTN, chronic venous TE.     PT Comments    Pt performed gait training progression and functional mobility.  +2 for close chair follow only.  Pt limited due to fatigue and pain in LLE.  Continue to recommend SNF placement at d/c.     Follow Up Recommendations  SNF     Equipment Recommendations  Wheelchair (measurements PT);Standard walker    Recommendations for Other Services       Precautions / Restrictions Precautions Precautions: Fall Restrictions Weight Bearing Restrictions: No    Mobility  Bed Mobility Overal bed mobility: Needs Assistance Bed Mobility: Rolling;Supine to Sit Rolling: Min assist   Supine to sit: Min assist     General bed mobility comments: Pt required assistance for trunk elevation.    Transfers Overall transfer level: Needs assistance Equipment used: Rolling walker (2 wheeled) Transfers: Sit to/from Stand Sit to Stand: +2 safety/equipment;Min assist         General transfer comment: Cues for hand placement to and from seated surface.  Ambulation/Gait Ambulation/Gait assistance: Mod assist Gait Distance (Feet): 25 Feet Assistive device: Rolling walker (2 wheeled) Gait Pattern/deviations: Step-to pattern;Trunk flexed;Decreased step length - right(hop to)     General Gait Details: Pt with short hop to pattern, stride decreased as patient fatigues, close chair follow for safety.     Stairs             Wheelchair Mobility    Modified Rankin (Stroke Patients Only)       Balance Overall balance assessment: Needs assistance Sitting-balance support: Single extremity supported Sitting balance-Leahy Scale: Good       Standing  balance-Leahy Scale: Poor Standing balance comment: with heavy reliance on RW for support.                              Cognition Arousal/Alertness: Awake/alert Behavior During Therapy: WFL for tasks assessed/performed Overall Cognitive Status: Within Functional Limits for tasks assessed                                        Exercises Amputee Exercises Hip Extension: AROM;Left;10 reps;AAROM;Sidelying Hip ABduction/ADduction: AROM;Left;10 reps;Sidelying Hip Flexion/Marching: AROM;5 reps;Supine;Left    General Comments        Pertinent Vitals/Pain Pain Assessment: Faces Faces Pain Scale: Hurts whole lot Pain Location: Left stump Pain Descriptors / Indicators: Sore Pain Intervention(s): Monitored during session;Repositioned    Home Living                      Prior Function            PT Goals (current goals can now be found in the care plan section) Acute Rehab PT Goals Patient Stated Goal: be mobile again Potential to Achieve Goals: Good Progress towards PT goals: Progressing toward goals    Frequency    Min 3X/week      PT Plan Current plan remains appropriate    Co-evaluation              AM-PAC PT "6 Clicks" Mobility   Outcome Measure  Help needed turning from your back to your side while in a flat bed without using bedrails?: None Help needed moving from lying on your back to sitting on the side of a flat bed without using bedrails?: A Little Help needed moving to and from a bed to a chair (including a wheelchair)?: A Little Help needed standing up from a chair using your arms (e.g., wheelchair or bedside chair)?: A Little Help needed to walk in hospital room?: A Lot Help needed climbing 3-5 steps with a railing? : Total 6 Click Score: 16    End of Session Equipment Utilized During Treatment: Gait belt Activity Tolerance: Patient tolerated treatment well;Patient limited by pain Patient left: in chair;with  chair alarm set;with call bell/phone within reach Nurse Communication: Mobility status;Need for lift equipment PT Visit Diagnosis: Unsteadiness on feet (R26.81);Other abnormalities of gait and mobility (R26.89);Repeated falls (R29.6);History of falling (Z91.81);Difficulty in walking, not elsewhere classified (R26.2);Pain Pain - Right/Left: Left Pain - part of body: Leg     Time: 1334-1400 PT Time Calculation (min) (ACUTE ONLY): 26 min  Charges:  $Gait Training: 8-22 mins $Therapeutic Activity: 8-22 mins                     Alisha [name], PTA Acute Rehabilitation Services Pager [phone] Office [phone]     Alisha [name] Alisha [name] 04/03/2019, 5:28 PM

## 2019-04-03 NOTE — Progress Notes (Addendum)
  Progress Note    04/03/2019 7:31 AM 1 Day Post-Op  Subjective:  Says her leg feels tight  Afebrile   Vitals:   04/03/19 0003 04/03/19 0404  BP: 114/74 125/87  Pulse: 89 83  Resp: 14 (!) 9  Temp: 97.6 F (36.4 C) 98.1 F (36.7 C)  SpO2: 98% 92%    Physical Exam: General:  No distress Lungs:  Non labored Incisions:  Wound vac in place with good seal.  Leg is soft.   CBC    Component Value Date/Time   WBC 15.6 (H) 04/03/2019 0220   RBC 3.12 (L) 04/03/2019 0220   HGB 9.4 (L) 04/03/2019 0220   HCT 31.1 (L) 04/03/2019 0220   PLT 367 04/03/2019 0220   MCV 99.7 04/03/2019 0220   MCH 30.1 04/03/2019 0220   MCHC 30.2 04/03/2019 0220   RDW 13.3 04/03/2019 0220   LYMPHSABS 3.1 04/03/2019 0220   MONOABS 1.5 (H) 04/03/2019 0220   EOSABS 0.1 04/03/2019 0220   BASOSABS 0.1 04/03/2019 0220    BMET    Component Value Date/Time   NA 141 04/03/2019 0220   K 4.3 04/03/2019 0220   CL 108 04/03/2019 0220   CO2 24 04/03/2019 0220   GLUCOSE 134 (H) 04/03/2019 0220   BUN 14 04/03/2019 0220   CREATININE 0.73 04/03/2019 0220   CREATININE 0.83 07/16/2013 1105   CALCIUM 8.7 (L) 04/03/2019 0220   GFRNONAA >60 04/03/2019 0220   GFRNONAA >60 07/29/2011 1220   GFRAA >60 04/03/2019 0220   GFRAA >60 07/29/2011 1220    INR    Component Value Date/Time   INR 1.1 04/02/2019 0236     Intake/Output Summary (Last 24 hours) at 04/03/2019 0731 Last data filed at 04/03/2019 0400 Gross per 24 hour  Intake 1251.76 ml  Output 480 ml  Net 771.76 ml     Assessment:  58 y.o. female is s/p:  Excisional debridement of left above-the-knee amputation (skin, subcutaneous tissue, and fascia) Placement of VAC  1 Day Post-Op  Plan: -pt doing well this am.  Wound vac in place with good seal.  Not much drainage from vac. -wound vac change on Monday. -c/o pain-changed vicodin from 1 tab qh6 prn to 1-2 q4h prn.  Has fentanyl IV ordered.  Did not adjust this.  -continue abx -heparin gtt     [name], PA-C [facility] [phone] 04/03/2019 7:31 AM  I have seen and evaluated the patient. I agree with the PA note as documented above. Vac change and debridement of L AKA in OR yesterday.  Vac with good seal this am.  Plan for next vac change Monday.  Working on pain control.  Marty Heck, MD [facility] [place] Office: [phone] Pager: [phone]

## 2019-04-03 NOTE — Progress Notes (Signed)
Patient very anxious and upset about wound vac. There was an air leak which released the pressure on the wound. I reinforced the dressing and sealed the leak. She says that her L AKA is hurting very badly from the pressure. Gave patient 2 tablets Norco. She asked for more but not time for another dose of Fentanyl. Educated patient on the need for the wound vac. Also patient asked for a valium. Administered that. She seems to be relaxed now. Will continue to monitor. Lajoyce Corners, RN

## 2019-04-04 LAB — CBC WITH DIFFERENTIAL/PLATELET
Abs Immature Granulocytes: 0.33 10*3/uL — ABNORMAL HIGH (ref 0.00–0.07)
Basophils Absolute: 0.1 10*3/uL (ref 0.0–0.1)
Basophils Relative: 1 %
Eosinophils Absolute: 0.3 10*3/uL (ref 0.0–0.5)
Eosinophils Relative: 2 %
HCT: 33 % — ABNORMAL LOW (ref 36.0–46.0)
Hemoglobin: 9.9 g/dL — ABNORMAL LOW (ref 12.0–15.0)
Immature Granulocytes: 2 %
Lymphocytes Relative: 35 %
Lymphs Abs: 4.8 10*3/uL — ABNORMAL HIGH (ref 0.7–4.0)
MCH: 30.7 pg (ref 26.0–34.0)
MCHC: 30 g/dL (ref 30.0–36.0)
MCV: 102.5 fL — ABNORMAL HIGH (ref 80.0–100.0)
Monocytes Absolute: 1 10*3/uL (ref 0.1–1.0)
Monocytes Relative: 7 %
Neutro Abs: 7 10*3/uL (ref 1.7–7.7)
Neutrophils Relative %: 53 %
Platelets: 385 10*3/uL (ref 150–400)
RBC: 3.22 MIL/uL — ABNORMAL LOW (ref 3.87–5.11)
RDW: 13.6 % (ref 11.5–15.5)
WBC: 13.5 10*3/uL — ABNORMAL HIGH (ref 4.0–10.5)
nRBC: 0.3 % — ABNORMAL HIGH (ref 0.0–0.2)

## 2019-04-04 LAB — BASIC METABOLIC PANEL
Anion gap: 7 (ref 5–15)
BUN: 15 mg/dL (ref 6–20)
CO2: 22 mmol/L (ref 22–32)
Calcium: 8.6 mg/dL — ABNORMAL LOW (ref 8.9–10.3)
Chloride: 109 mmol/L (ref 98–111)
Creatinine, Ser: 0.73 mg/dL (ref 0.44–1.00)
GFR calc Af Amer: >60 mL/min (ref >60)
GFR calc non Af Amer: >60 mL/min (ref >60)
Glucose, Bld: 90 mg/dL (ref 70–99)
Potassium: 4.5 mmol/L (ref 3.5–5.1)
Sodium: 138 mmol/L (ref 135–145)

## 2019-04-04 LAB — GLUCOSE, CAPILLARY: Glucose-Capillary: 112 mg/dL — ABNORMAL HIGH (ref 70–99)

## 2019-04-04 LAB — VANCOMYCIN, TROUGH: Vancomycin Tr: 8 ug/mL — ABNORMAL LOW (ref 15–20)

## 2019-04-04 NOTE — Plan of Care (Signed)
Care plans reviewed and patient is progressing.  

## 2019-04-04 NOTE — Progress Notes (Signed)
Pharmacy Antibiotic Note  Alisha [name] is a 58 y.o. female admitted on 03/29/2019 with L AKA infection.  Pharmacy has been consulted for zosyn and vancomycin dosing. She is s/p I&D on 3/30 and possible wound vac change Monday  4/5 PM: WBC 13.5, SCr= 0.73 and CrCl ~ 87.1, wound culture - multiple organisms -AUC therapeutic, calculated = 410.8  Plan: -Continue vancomycin  1250mg  IV q24h -AUC goal 400-550 -Levels as needed, monitor renal function -Continue zosyn 3.375gm IV q8h   Height: 5\' 4"  (162.6 cm) Weight: 206 lb 12.7 oz (93.8 kg) IBW/kg (Calculated) : 54.7  Temp (24hrs), Avg:97.8 F (36.6 C), Min:97.4 F (36.3 C), Max:98.3 F (36.8 C)  Recent Labs  Lab 03/29/19 1200 03/29/19 2005 03/30/19 0323 03/31/19 0740 04/02/19 0236 04/03/19 0220 04/03/19 2048 04/04/19 0422 04/04/19 1652  WBC 12.0*  --  10.3 11.4* 11.0* 15.6*  --  13.5*  --   CREATININE 1.09*  --  0.97  --  0.79 0.73  --  0.73  --   LATICACIDVEN 1.4 1.1  --   --   --   --   --   --   --   VANCOTROUGH  --   --   --   --   --   --   --   --  8*  VANCOPEAK  --   --   --   --   --   --  28*  --   --     Estimated Creatinine Clearance: 87.1 mL/min (by C-G formula based on SCr of 0.73 mg/dL).    Allergies  Allergen Reactions  . Propoxyphene N-Acetaminophen Anaphylaxis and Swelling  . Strawberry Extract Anaphylaxis and Shortness Of Breath    "I go into shock"  . Buspar [Buspirone] Swelling    "Swells my face"  . Grapefruit Extract Other (See Comments)    Interaction with medications she's taking  . Tape Itching    Patient says that it itches and swells with some adhesive dressings.    Antimicrobials this admission: Vancomycin 3/30 >>  Zosyn 3/30 >>  Clindamycin 3/30 x1  Dose adjustments this admission: N/A  Microbiology results: 3/30 Wound Cx: abundant GPR, GNR, GPC. None predominant (final)  Thank you for allowing pharmacy to be a part of this patient's care.  Janae Bridgeman, PharmD PGY1  Pharmacy Resident Phone: [phone] 04/04/2019 5:42 PM

## 2019-04-04 NOTE — Progress Notes (Signed)
  Progress Note    04/04/2019 8:44 AM 1 Day Post-Op  Subjective:  Still having pain in L AKA  Vitals:   04/04/19 0356 04/04/19 0757  BP: (!) 167/103 (!) 155/103  Pulse: 89 92  Resp: 17 (!) 9  Temp: (!) 97.4 F (36.3 C) 98.3 F (36.8 C)  SpO2: 99% 96%    Physical Exam: General:  No distress Lungs:  Non labored Incisions:  Wound vac in place with good seal.  Leg is soft.   CBC    Component Value Date/Time   WBC 13.5 (H) 04/04/2019 0422   RBC 3.22 (L) 04/04/2019 0422   HGB 9.9 (L) 04/04/2019 0422   HCT 33.0 (L) 04/04/2019 0422   PLT 385 04/04/2019 0422   MCV 102.5 (H) 04/04/2019 0422   MCH 30.7 04/04/2019 0422   MCHC 30.0 04/04/2019 0422   RDW 13.6 04/04/2019 0422   LYMPHSABS 4.8 (H) 04/04/2019 0422   MONOABS 1.0 04/04/2019 0422   EOSABS 0.3 04/04/2019 0422   BASOSABS 0.1 04/04/2019 0422    BMET    Component Value Date/Time   NA 138 04/04/2019 0422   K 4.5 04/04/2019 0422   CL 109 04/04/2019 0422   CO2 22 04/04/2019 0422   GLUCOSE 90 04/04/2019 0422   BUN 15 04/04/2019 0422   CREATININE 0.73 04/04/2019 0422   CREATININE 0.83 07/16/2013 1105   CALCIUM 8.6 (L) 04/04/2019 0422   GFRNONAA >60 04/04/2019 0422   GFRNONAA >60 07/29/2011 1220   GFRAA >60 04/04/2019 0422   GFRAA >60 07/29/2011 1220    INR    Component Value Date/Time   INR 1.1 04/02/2019 0236     Intake/Output Summary (Last 24 hours) at 04/04/2019 0844 Last data filed at 04/04/2019 0429 Gross per 24 hour  Intake 3 ml  Output 380 ml  Net -377 ml     Assessment:  58 y.o. female is s/p:  Excisional debridement of left above-the-knee amputation (skin, subcutaneous tissue, and fascia) Placement of VAC  1 Day Post-Op  Plan: -pt doing well this am.  Wound vac in place with good seal.  Not much drainage from vac. -wound vac change on Monday - will attempt bedside if she tolerates -c/o ongoing pain - meds adjusted yesterday - changed vicodin from 1 tab qh6 prn to 1-2 q4h prn.  Has fentanyl  IV ordered..  -continue abx -heparin gtt    Marty Heck, MD [facility] [place] Office: [phone] Pager: [place]

## 2019-04-04 NOTE — Progress Notes (Signed)
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 She was seen in the emergency department after this fall, the site appeared to be intact per documentation, she was given a small fluid bolus for low blood pressure, and returned to her nursing facility. Since that time, there has been increasing pain and increasingly purulent and malodorous drainage.  ED Course:Upon arrival to the ED, patient is found to be afebrile, saturating well on room air, hypotensive initially with blood pressure 86/65, and normal heart rate and respirations.EKG features a sinus rhythm. Chemistry panel is unremarkable and CBC notable for leukocytosis to 12,000. Lactic acid is reassuringly normal. Patient was given 500 cc normal saline, fentanyl, vancomycin, Zosyn, and clindamycin in the ED. Vascular surgery was consulted and is taking the patient to the OR for debridement and washout, anticipates need for additional debridements during this hospitalization, and recommends admission to the medical service.   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 Follow final cultures Status post excisional debridement of the left above-knee amputation and placement of wound VAC4/3. Reviewed CSW note regarding patient refusal to go to SNF.  Patient has gone to [facility] before.  There is a concern that she is using drugs or continues to use drugs as an outpatient.  Patient is not able to care for herself at home with a recent left AKA.  She will need rehab at the time of discharge.  CONTINUE pain management outlined by vascular surgery.  History of DVT on Xarelto: Restart Xarelto when okay with vascular surgery. Continue to hold off Xarelto Continue heparin drip until vascular surgery has completed all procedures Pharmacy managing heparin drip  Chronic combined systolic and diastolic CHF: -Last 2D echo done on in February 2019 revealed LVEF 35 to 40% with grade 1 diastolic dysfunction -Stable. No CHF symptoms.    Severe peripheral vascular disease status post left above-the-knee amputation: Continue aspirin, Lipitor  COPD: Stable.  Chronic tobacco use disorder: Counseled.   Hypertension Optimized. Continue to monitor vital signs  Physical debility/ambulatory dysfunction PT to assess Fall precautions (avoid hypotensive episodes)  DVT prophylaxis:Heparin drip  Code Status:Full Family Communication:None at bedside Consults called:Vascular surgery  Estimated body mass index is 35.5 kg/m as calculated from the following:   Height as of this encounter: 5\' 4"  (1.626 m).   Weight as of this encounter: 93.8 kg.     Subjective: Complains the left stump VAC is still tight  Objective: Vitals:   04/04/19 0356 04/04/19 0757 04/04/19 0800  04/04/19 0900  BP: (!) 167/103 (!) 155/103 (!) 152/103 129/88  Pulse: 89 92 92   Resp: 17 (!) 9 10 16   Temp: (!) 97.4 F (36.3 C) 98.3 F (36.8 C)    TempSrc: Oral Oral    SpO2: 99% 96% 97% 97%  Weight: 93.8 kg     Height:        Intake/Output Summary (Last 24 hours) at 04/04/2019 1109 Last data filed at 04/04/2019 0936 Gross per 24 hour  Intake 3 ml  Output 580 ml  Net -577 ml   Filed Weights   04/02/19 0423 04/03/19 0404 04/04/19 0356  Weight: 85.3 kg 86.6 kg 93.8 kg    Examination:  General exam: Appears calm and comfortable  Respiratory system: Clear to auscultation. Respiratory effort normal. Cardiovascular system: S1 & S2 heard, RRR. No JVD, murmurs, rubs, gallops or clicks. No pedal edema. Gastrointestinal system: Abdomen is nondistended, soft and nontender. No organomegaly or masses felt. Normal bowel sounds heard. Central nervous system: Alert and oriented. No focal neurological deficits. Extremities: Left AKA VAC in place. Skin: No rashes, lesions or ulcers   Data Reviewed: I have personally reviewed following labs and imaging studies  CBC: Recent Labs  Lab 03/29/19 1200 03/30/19 0323 03/31/19 0740 04/02/19 0236 04/03/19 0220 04/04/19 0422  WBC 12.0* 10.3 11.4* 11.0* 15.6* 13.5*  NEUTROABS 7.9*  --   --   --  10.7* 7.0  HGB 11.3* 9.9* 9.3* 8.9* 9.4* 9.9*  HCT 37.1 31.1* 30.0* 29.2* 31.1* 33.0*  MCV 99.7 98.1 97.4 99.0 99.7 102.5*  PLT 380 313 334 341 367 277   Basic Metabolic Panel: Recent Labs  Lab 03/29/19 1200 03/30/19 0323 04/02/19 0236 04/03/19 0220 04/04/19 0422  NA 137 137 139 141 138  K 4.3 4.4 4.1 4.3 4.5  CL 103 104 107 108 109  CO2 23 25 25 24 22   GLUCOSE 100* 137* 132* 134* 90  BUN 21* 20 15 14 15   CREATININE 1.09* 0.97 0.79 0.73 0.73  CALCIUM 9.2 8.5* 8.5* 8.7* 8.6*   GFR: Estimated Creatinine Clearance: 87.1 mL/min (by C-G formula based on SCr of 0.73 mg/dL). Liver Function Tests: Recent Labs  Lab 03/29/19 1200  AST 18   ALT 15  ALKPHOS 68  BILITOT 0.8  PROT 7.9  ALBUMIN 3.1*   No results for input(s): LIPASE, AMYLASE in the last 168 hours. No results for input(s): AMMONIA in the last 168 hours. Coagulation Profile: Recent Labs  Lab 04/02/19 0236  INR 1.1   Cardiac Enzymes: No results for input(s): CKTOTAL, CKMB, CKMBINDEX, TROPONINI in the last 168 hours. BNP (last 3 results) No results for input(s): PROBNP in the last 8760 hours. HbA1C: No results for input(s): HGBA1C in the last 72 hours. CBG: Recent Labs  Lab 03/30/19 0850 03/31/19 0637 04/03/19 0642 04/04/19 0600  GLUCAP 106* 112* 136* 112*   Lipid Profile: No results for input(s): CHOL, HDL, LDLCALC, TRIG, CHOLHDL, LDLDIRECT in the last 72 hours. Thyroid Function Tests: No results for input(s): TSH, T4TOTAL, FREET4, T3FREE, THYROIDAB in the last 72 hours. Anemia Panel: No results for input(s): VITAMINB12, FOLATE, FERRITIN, TIBC, IRON, RETICCTPCT in the last 72 hours. Sepsis Labs: Recent Labs  Lab 03/29/19 1200 03/29/19 2005  LATICACIDVEN 1.4 1.1    Recent Results (from the past 240 hour(s))  Wound or Superficial Culture     Status: Abnormal   Collection Time: 03/29/19 12:12 PM  Result Value  Ref Range Status   Specimen Description WOUND LEFT LEG  Final   Special Requests NONE  Final   Gram Stain   Final    RARE WBC PRESENT, PREDOMINANTLY PMN ABUNDANT GRAM POSITIVE COCCI ABUNDANT GRAM NEGATIVE RODS ABUNDANT GRAM POSITIVE RODS Performed at [facility], [place] [address] [place], [place] [address]    Culture MULTIPLE ORGANISMS PRESENT, NONE PREDOMINANT (A)  Final   Report Status 03/31/2019 FINAL  Final         Radiology Studies: No results found.      Scheduled Meds: . aspirin EC  81 mg Oral Daily  . atorvastatin  40 mg Oral QHS  . DULoxetine  60 mg Oral BID  . gabapentin  1,600 mg Oral QHS  . gabapentin  800 mg Oral BID  . hydrOXYzine  50 mg Oral QHS  . multivitamin with minerals  1 tablet Oral  Daily  . rivaroxaban  10 mg Oral Daily  . sodium chloride flush  3 mL Intravenous Q12H  . sodium chloride flush  3 mL Intravenous Q12H   Continuous Infusions: . sodium chloride    . piperacillin-tazobactam (ZOSYN)  IV 3.375 g (04/04/19 0427)  . vancomycin Stopped (04/03/19 1854)     LOS: 6 days     Georgette Shell, MD [facility] If 7PM-7AM, please contact night-coverage [email or web address] Password [email or web address] 04/04/2019, 11:09 AM

## 2019-04-05 LAB — GLUCOSE, CAPILLARY: Glucose-Capillary: 130 mg/dL — ABNORMAL HIGH (ref 70–99)

## 2019-04-05 MED ORDER — CARVEDILOL 12.5 MG PO TABS
12.5000 mg | ORAL_TABLET | Freq: Two times a day (BID) | ORAL | Status: DC
  Administered 2019-04-06: 12.5 mg via ORAL
  Filled 2019-04-05: qty 1

## 2019-04-05 MED ORDER — HYDRALAZINE HCL 20 MG/ML IJ SOLN
5.0000 mg | Freq: Four times a day (QID) | INTRAMUSCULAR | Status: DC | PRN
  Administered 2019-04-05: 5 mg via INTRAVENOUS
  Filled 2019-04-05: qty 1

## 2019-04-05 MED ORDER — CARVEDILOL 3.125 MG PO TABS
3.1250 mg | ORAL_TABLET | Freq: Two times a day (BID) | ORAL | Status: DC
  Administered 2019-04-05: 3.125 mg via ORAL
  Filled 2019-04-05: qty 1

## 2019-04-05 NOTE — Progress Notes (Addendum)
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) Active Problems:   TOBACCO ABUSE   Depression with anxiety   Peripheral vascular disease ([place])   Venous thromboembolism   Chronic combined systolic and diastolic CHF (congestive heart failure) ([place])  Left AKA stump infection complicated by peripheral artery disease: -Status post debridement on 03/29/2019  And Status post excisional debridement of the left above-knee amputation and placement of wound VAC4/3 by vascular surgery  -she has been on vanc/zosyn since admission, Wound culture is growing multiple organisms, will follow vascular surgery regarding abx  -she is off heparin drip, xarelto restarted on 4/4 -on asa/statin,  -patient now agrees to SNF placement, social worker consulted  Wound vac/wound care orders per vascular surgery   History of DVT on Xarelto:  Was on heparin drip  Xarelto restarted .  Chronic combined systolic and diastolic CHF: -Last 2D echo done on in February 2019 revealed LVEF 35 to 40% with grade 1 diastolic dysfunction -Stable. No CHF symptoms.   Hypertension  - BP 85/65 on arrival to ED, home bp meds held since admission -bp now stable, gradually restart bp meds, start coreg today, continue hold losartan/hctz  COPD: Lung clears, on room air, Stable.   Chronic tobacco use disorder: Counseled.   Code Status: full  Family Communication: patient   Disposition Plan: to SNF once cleared by vascular surgery   Consultants:  Vascular surgery  Procedures:  Status post debridement on 03/29/2019    Status post  excisional debridement of the left above-knee amputation and placement of wound VAC4/3 by vascular surgery   Antibiotics:  Vanc/zosyn since admission   Objective: BP (!) 142/87 (BP Location: Right Arm)   Pulse 87   Temp 98 F (36.7 C) (Oral)   Resp 12   Ht 5\' 4"  (1.626 m)   Wt 87.7 kg   LMP 11/22/2015   SpO2 95%   BMI 33.19 kg/m   Intake/Output Summary (Last 24 hours) at 04/05/2019 1150 Last data filed at 04/05/2019 0800 Gross per 24 hour  Intake 1473 ml  Output 330 ml  Net 1143 ml   Filed Weights   04/03/19 0404 04/04/19 0356 04/05/19 0308  Weight: 86.6 kg 93.8 kg 87.7 kg    Exam: Patient is examined daily including today on 04/05/2019, exams remain the same as of yesterday except that has changed    General:  NAD  Cardiovascular: RRR  Respiratory: CTABL  Abdomen: Soft/ND/NT, positive BS  Musculoskeletal: left aka, No Edema  Neuro: alert, oriented   Data Reviewed: Basic Metabolic Panel: Recent Labs  Lab 03/29/19 1200 03/30/19 0323 04/02/19 0236 04/03/19 0220 04/04/19 0422  NA 137 137 139 141 138  K 4.3 4.4 4.1 4.3 4.5  CL 103 104 107 108 109  CO2 23 25 25 24 22   GLUCOSE 100* 137* 132* 134* 90  BUN 21* 20 15 14 15   CREATININE 1.09* 0.97 0.79 0.73 0.73  CALCIUM 9.2 8.5* 8.5* 8.7* 8.6*   Liver Function Tests: Recent Labs  Lab 03/29/19 1200  AST 18  ALT 15  ALKPHOS 68  BILITOT  0.8  PROT 7.9  ALBUMIN 3.1*   No results for input(s): LIPASE, AMYLASE in the last 168 hours. No results for input(s): AMMONIA in the last 168 hours. CBC: Recent Labs  Lab 03/29/19 1200 03/30/19 0323 03/31/19 0740 04/02/19 0236 04/03/19 0220 04/04/19 0422  WBC 12.0* 10.3 11.4* 11.0* 15.6* 13.5*  NEUTROABS 7.9*  --   --   --  10.7* 7.0  HGB 11.3* 9.9* 9.3* 8.9* 9.4* 9.9*  HCT 37.1 31.1* 30.0* 29.2* 31.1* 33.0*  MCV 99.7 98.1 97.4 99.0 99.7 102.5*  PLT 380 313 334 341 367 385   Cardiac Enzymes:   No results for input(s): CKTOTAL, CKMB, CKMBINDEX, TROPONINI in  the last 168 hours. BNP (last 3 results) No results for input(s): BNP in the last 8760 hours.  ProBNP (last 3 results) No results for input(s): PROBNP in the last 8760 hours.  CBG: Recent Labs  Lab 03/30/19 0850 03/31/19 0637 04/03/19 0642 04/04/19 0600 04/05/19 0632  GLUCAP 106* 112* 136* 112* 130*    Recent Results (from the past 240 hour(s))  Wound or Superficial Culture     Status: Abnormal   Collection Time: 03/29/19 12:12 PM  Result Value Ref Range Status   Specimen Description WOUND LEFT LEG  Final   Special Requests NONE  Final   Gram Stain   Final    RARE WBC PRESENT, PREDOMINANTLY PMN ABUNDANT GRAM POSITIVE COCCI ABUNDANT GRAM NEGATIVE RODS ABUNDANT GRAM POSITIVE RODS Performed at [facility], [place] [address] [place], [place] [address]    Culture MULTIPLE ORGANISMS PRESENT, NONE PREDOMINANT (A)  Final   Report Status 03/31/2019 FINAL  Final     Studies: No results found.  Scheduled Meds: . aspirin EC  81 mg Oral Daily  . atorvastatin  40 mg Oral QHS  . DULoxetine  60 mg Oral BID  . gabapentin  1,600 mg Oral QHS  . gabapentin  800 mg Oral BID  . hydrOXYzine  50 mg Oral QHS  . multivitamin with minerals  1 tablet Oral Daily  . rivaroxaban  10 mg Oral Daily  . sodium chloride flush  3 mL Intravenous Q12H  . sodium chloride flush  3 mL Intravenous Q12H    Continuous Infusions: . sodium chloride    . piperacillin-tazobactam (ZOSYN)  IV 3.375 g (04/05/19 0551)  . vancomycin Stopped (04/04/19 1957)     Time spent: 34mins I have personally reviewed and interpreted on  04/05/2019 daily labs, tele strips, imagings as discussed above under date review session and assessment and plans.  I reviewed all nursing notes, pharmacy notes, consultant notes,  vitals, pertinent old records  I have discussed plan of care as described above with RN , patient on 04/05/2019   Florencia Reasons MD, PhD  [facility] Pager [phone]. If 7PM-7AM, please contact  night-coverage at [email or web address], password [facility] 04/05/2019, 11:50 AM  LOS: 7 days

## 2019-04-05 NOTE — Consult Note (Signed)
[place] Nurse wound consult note Reason for Consult:NPWT (VAC) dressing change to left AKA site, infectious.   Wound type:surgical Pressure Injury POA: NA Measurement:8 cm x 11.5 cm x 4 cm  Wound HTV:GVSYV red  Drainage (amount, consistency, odor) moderate serosanguinous in canister Periwound:intact Dressing procedure/placement/frequency:Cleanse wound with NS.  2 pieces black foam were placed in irregulaarly shaped wound. Covered with drape and immediate seal achieved.  Will change M/W/F WOC team will follow and remain available to patient, medical and nursing teams.  Domenic Moras MSN, RN, FNP-BC CWON Wound, Ostomy, Continence Nurse Pager [phone]

## 2019-04-05 NOTE — NC FL2 (Signed)
[place] LEVEL OF CARE SCREENING TOOL     IDENTIFICATION  Patient Name: Alisha [name] Birthdate: October 28, 1961 Sex: female Admission Date (Current Location): 03/29/2019  [facility] and [place] Number:  Herbalist and Address:  [facility], [place] [address], [place], [place] [address]      Provider Number: [id number]  Attending Physician Name and Address:  Florencia Reasons, MD  Relative Name and Phone Number:       Current Level of Care: Hospital Recommended Level of Care: [place] Prior Approval Number:    Date Approved/Denied:   PASRR Number: Pending   Discharge Plan: SNF    Current Diagnoses: Patient Active Problem List   Diagnosis Date Noted  . 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 Chronic combined systolic and diastolic CHF (congestive heart failure) ([place]) 03/29/2019  . Gangrene of left foot ([place]) 02/14/2019  . Loss of consciousness ([place]) 02/15/2018  . Asthma 10/07/2016  . Colonic diverticular abscess   . Anxiety 08/28/2014  . Breast pain, left 08/11/2014  . Abnormal ECG 01/28/2014  . Health care maintenance 09/16/2013  . Metrorrhagia 10/12/2012  . Endometrial mass 10/12/2012  . 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 Osteoarthritis of both knees 02/15/2012  . Chronic pain syndrome 07/05/2010  . Venous thromboembolism 02/22/2010  . Depression with anxiety 07/17/2009  . 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 TOBACCO ABUSE 10/20/2006  . Essential hypertension 10/20/2006  . Peripheral vascular disease ([place]) 10/20/2006  . HYPERLIPIDEMIA, MIXED 10/10/2006    Orientation RESPIRATION BLADDER Height & Weight     Self, Time, Situation, Place  Normal External catheter Weight: 193 lb 5.5 oz (87.7 kg) Height:  5\' 4"  (162.6 cm)  BEHAVIORAL SYMPTOMS/MOOD NEUROLOGICAL BOWEL NUTRITION STATUS      Incontinent Diet(please see discharge summary)  AMBULATORY STATUS  COMMUNICATION OF NEEDS Skin   Limited Assist Verbally Surgical wounds(incision(closed) thigh,left; incision(closed) leg,left; neagative pressure wound therapy, leg,left)                       Personal Care Assistance Level of Assistance  Bathing, Feeding, Dressing Bathing Assistance: Limited assistance Feeding assistance: Independent Dressing Assistance: Limited assistance     Functional Limitations Info  Sight, Hearing, Speech Sight Info: Adequate Hearing Info: Adequate Speech Info: Adequate    SPECIAL CARE FACTORS FREQUENCY  PT (By licensed PT), OT (By licensed OT)     PT Frequency: 3x per week  OT Frequency: 3x per week            Contractures Contractures Info: Not present    Additional Factors Info  Code Status, Allergies, Psychotropic Code Status Info: FULL Allergies Info: Propoxyphene, N-acetaminophen, Strawberry Extract, Buspar buspirone, Grapefruit Extract, Tape Psychotropic Info: DULoxetine (CYMBALTA) DR capsule 60 mg          Current Medications (04/05/2019):  This is the current hospital active medication list Current Facility-Administered Medications  Medication Dose Route Frequency Provider Last Rate Last Dose  . 0.9 %  sodium chloride infusion  250 mL Intravenous PRN Angelia Mould, MD      . albuterol (PROVENTIL) (2.5 MG/3ML) 0.083% nebulizer solution 2.5 mg  2.5 mg Nebulization Q6H PRN Angelia Mould, MD      . aspirin EC tablet 81 mg  81 mg Oral Daily Angelia Mould, MD   81 mg at 04/05/19 0840  . atorvastatin (LIPITOR) tablet 40 mg  40 mg Oral QHS Angelia Mould, MD   40 mg at 04/04/19 2122  . carvedilol (COREG) tablet 3.125 mg  3.125 mg Oral BID WC Florencia Reasons, MD      . diazepam (VALIUM) tablet 2 mg  2 mg Oral Q12H PRN Angelia Mould, MD   2 mg at 04/03/19 2130  . DULoxetine (CYMBALTA) DR capsule 60 mg  60 mg Oral BID Angelia Mould, MD   60 mg at 04/05/19 0840  . fentaNYL (SUBLIMAZE) injection 25-50  mcg  25-50 mcg Intravenous Q6H PRN Angelia Mould, MD   50 mcg at 04/05/19 1251  . gabapentin (NEURONTIN) capsule 1,600 mg  1,600 mg Oral QHS Angelia Mould, MD   1,600 mg at 04/04/19 2122  . gabapentin (NEURONTIN) capsule 800 mg  800 mg Oral BID Angelia Mould, MD   800 mg at 04/05/19 1605  . HYDROcodone-acetaminophen (NORCO/VICODIN) 5-325 MG per tablet 1-2 tablet  1-2 tablet Oral Q4H PRN Gabriel Earing, PA-C   2 tablet at 04/05/19 1605  . hydrOXYzine (ATARAX/VISTARIL) tablet 50 mg  50 mg Oral QHS Angelia Mould, MD   50 mg at 04/04/19 2304  . hydrOXYzine (ATARAX/VISTARIL) tablet 50 mg  50 mg Oral BID PRN Angelia Mould, MD   50 mg at 04/05/19 1303  . multivitamin with minerals tablet 1 tablet  1 tablet Oral Daily Angelia Mould, MD   1 tablet at 04/05/19 0840  . nicotine (NICODERM CQ - dosed in mg/24 hr) patch 7 mg  7 mg Transdermal Daily PRN Angelia Mould, MD   7 mg at 04/05/19 0319  . ondansetron (ZOFRAN) tablet 4 mg  4 mg Oral Q6H PRN Angelia Mould, MD       Or  . ondansetron [facility]) injection 4 mg  4 mg Intravenous Q6H PRN Angelia Mould, MD      . piperacillin-tazobactam (ZOSYN) IVPB 3.375 g  3.375 g Intravenous Q8H Angelia Mould, MD 12.5 mL/hr at 04/05/19 1301 3.375 g at 04/05/19 1301  . polyethylene glycol (MIRALAX / GLYCOLAX) packet 17 g  17 g Oral Daily PRN Angelia Mould, MD      . rivaroxaban Alveda Reasons) tablet 10 mg  10 mg Oral Daily Wynell Balloon, RPH   10 mg at 04/05/19 0841  . sodium chloride flush (NS) 0.9 % injection 3 mL  3 mL Intravenous Q12H Angelia Mould, MD   3 mL at 04/04/19 2123  . sodium chloride flush (NS) 0.9 % injection 3 mL  3 mL Intravenous Q12H Angelia Mould, MD   3 mL at 04/04/19 1054  . sodium chloride flush (NS) 0.9 % injection 3 mL  3 mL Intravenous PRN Angelia Mould, MD      . vancomycin (VANCOCIN) 1,250 mg in sodium chloride 0.9 % 250 mL  IVPB  1,250 mg Intravenous Q24H Kris Mouton, [facility]   Stopped at 04/04/19 8329     Discharge Medications: Please see discharge summary for a list of discharge medications.  Relevant Imaging Results:  Relevant Lab Results:   Additional Information SSN [place] [id number] [place], [place]

## 2019-04-05 NOTE — Progress Notes (Signed)
Physical Therapy Treatment Patient Details Name: Alisha [name] MRN: [id number] DOB: [birth year] Today's Date: 04/05/2019    History of Present Illness 58 yo F presents to ED after fall from [facility] c/o stump pain and drainage. Underwent I&D and VAC placement on 4/3. PMH - PVD, bipolar disorder, CHF, anxiety/depression, fibromyalgia, asthma, HTN, chronic venous TE.     PT Comments    Attempted amb again but pt still had liquid stool which limited mobility.    Follow Up Recommendations  SNF     Equipment Recommendations  Other (comment)(TBD at next venue)    Recommendations for Other Services       Precautions / Restrictions Precautions Precautions: Fall Restrictions Weight Bearing Restrictions: Yes LLE Weight Bearing: Non weight bearing    Mobility  Bed Mobility Overal bed mobility: Needs Assistance Bed Mobility: Sit to Supine     Supine to sit: Min assist Sit to supine: Min guard   General bed mobility comments: Assist for safety  Transfers Overall transfer level: Needs assistance Equipment used: Rolling walker (2 wheeled) Transfers: Sit to/from Stand Sit to Stand: +2 safety/equipment;Min assist Stand pivot transfers: Min assist;+2 safety/equipment       General transfer comment: Assist to bring hips up and for balance. Verbal cues for hand placement. Transferred bed to bsc with stand pivot with walker  Ambulation/Gait Ambulation/Gait assistance: Min assist;+2 safety/equipment Gait Distance (Feet): 3 Feet(distance limited by liquid stool) Assistive device: Rolling walker (2 wheeled) Gait Pattern/deviations: Step-to pattern;Trunk flexed(hop to) Gait velocity: decr Gait velocity interpretation: <1.31 ft/sec, indicative of household ambulator General Gait Details: Pt hopped from recliner to bsc and then bsc to bed. Assist for balance and support   Stairs             Wheelchair Mobility    Modified Rankin (Stroke Patients Only)       Balance  Overall balance assessment: Needs assistance Sitting-balance support: No upper extremity supported Sitting balance-Leahy Scale: Good     Standing balance support: Bilateral upper extremity supported Standing balance-Leahy Scale: Poor Standing balance comment: walker and min assist for static standing                            Cognition Arousal/Alertness: Awake/alert Behavior During Therapy: WFL for tasks assessed/performed Overall Cognitive Status: Within Functional Limits for tasks assessed                                        Exercises      General Comments        Pertinent Vitals/Pain Pain Assessment: Faces Faces Pain Scale: Hurts even more Pain Location: lt residual limb Pain Descriptors / Indicators: Sore;Grimacing;Guarding Pain Intervention(s): Monitored during session;Repositioned;Patient requesting pain meds-RN notified    Home Living                      Prior Function            PT Goals (current goals can now be found in the care plan section) Progress towards PT goals: Progressing toward goals    Frequency    Min 2X/week      PT Plan Current plan remains appropriate;Frequency needs to be updated    Co-evaluation              AM-PAC PT "6 Clicks" Mobility   Outcome Measure  Help needed turning from your back to your side while in a flat bed without using bedrails?: None Help needed moving from lying on your back to sitting on the side of a flat bed without using bedrails?: A Little Help needed moving to and from a bed to a chair (including a wheelchair)?: A Little Help needed standing up from a chair using your arms (e.g., wheelchair or bedside chair)?: A Little Help needed to walk in hospital room?: A Little Help needed climbing 3-5 steps with a railing? : Total 6 Click Score: 17    End of Session Equipment Utilized During Treatment: Gait belt Activity Tolerance: Patient tolerated treatment  well;Other (comment)(Limited by liquid stool) Patient left: in chair;with chair alarm set;with call bell/phone within reach Nurse Communication: Mobility status;Need for lift equipment PT Visit Diagnosis: Unsteadiness on feet (R26.81);Other abnormalities of gait and mobility (R26.89);Repeated falls (R29.6);History of falling (Z91.81);Difficulty in walking, not elsewhere classified (R26.2);Pain Pain - Right/Left: Left Pain - part of body: Leg     Time: 1761-6073 PT Time Calculation (min) (ACUTE ONLY): 20 min  Charges:  $Therapeutic Activity: 8-22 mins                     [place] Pager [phone] Office [place] 04/05/2019, 2:05 PM

## 2019-04-05 NOTE — Progress Notes (Signed)
Physical Therapy Treatment Patient Details Name: Alisha [name] MRN: [id number] DOB: 1961/02/26 Today's Date: 04/05/2019    History of Present Illness 58 yo F presents to ED after fall from [facility] c/o stump pain and drainage. Underwent I&D and VAC placement on 4/3. PMH - PVD, bipolar disorder, CHF, anxiety/depression, fibromyalgia, asthma, HTN, chronic venous TE.     PT Comments    Pt making steady progress. Today limited by several episodes of liquid stool.    Follow Up Recommendations  SNF     Equipment Recommendations  Other (comment)(TBD at next venue)    Recommendations for Other Services       Precautions / Restrictions Precautions Precautions: Fall Restrictions Weight Bearing Restrictions: Yes LLE Weight Bearing: Non weight bearing    Mobility  Bed Mobility Overal bed mobility: Needs Assistance Bed Mobility: Supine to Sit     Supine to sit: Min assist     General bed mobility comments: Assist to elevate trunk into sitting  Transfers Overall transfer level: Needs assistance Equipment used: Rolling walker (2 wheeled) Transfers: Sit to/from Stand Sit to Stand: +2 safety/equipment;Min assist Stand pivot transfers: Min assist;+2 safety/equipment       General transfer comment: Assist to bring hips up and for balance. Verbal cues for hand placement. Transferred bed to bsc with stand pivot with walker  Ambulation/Gait Ambulation/Gait assistance: Min assist;+2 safety/equipment Gait Distance (Feet): 2 Feet(Distance limited by liquid stool) Assistive device: Rolling walker (2 wheeled) Gait Pattern/deviations: Step-to pattern;Trunk flexed(hop to) Gait velocity: decr Gait velocity interpretation: <1.31 ft/sec, indicative of household ambulator General Gait Details: Pt hopped bsc to chair with walker with assist for balance and safety.    Stairs             Wheelchair Mobility    Modified Rankin (Stroke Patients Only)       Balance Overall  balance assessment: Needs assistance Sitting-balance support: No upper extremity supported Sitting balance-Leahy Scale: Good     Standing balance support: Bilateral upper extremity supported Standing balance-Leahy Scale: Poor Standing balance comment: walker and min assist for static standing                            Cognition Arousal/Alertness: Awake/alert Behavior During Therapy: WFL for tasks assessed/performed Overall Cognitive Status: Within Functional Limits for tasks assessed                                        Exercises      General Comments        Pertinent Vitals/Pain Pain Assessment: Faces Faces Pain Scale: Hurts even more Pain Location: lt residual limb Pain Descriptors / Indicators: Sore;Grimacing;Guarding Pain Intervention(s): Monitored during session;Limited activity within patient's tolerance;Repositioned    Home Living                      Prior Function            PT Goals (current goals can now be found in the care plan section) Progress towards PT goals: Progressing toward goals    Frequency    Min 2X/week      PT Plan Current plan remains appropriate;Frequency needs to be updated    Co-evaluation              AM-PAC PT "6 Clicks" Mobility   Outcome Measure  Help  needed turning from your back to your side while in a flat bed without using bedrails?: None Help needed moving from lying on your back to sitting on the side of a flat bed without using bedrails?: A Little Help needed moving to and from a bed to a chair (including a wheelchair)?: A Little Help needed standing up from a chair using your arms (e.g., wheelchair or bedside chair)?: A Little Help needed to walk in hospital room?: A Little Help needed climbing 3-5 steps with a railing? : Total 6 Click Score: 17    End of Session Equipment Utilized During Treatment: Gait belt Activity Tolerance: Patient tolerated treatment well;Other  (comment)(Limited by liquid stool) Patient left: in chair;with chair alarm set;with call bell/phone within reach Nurse Communication: Mobility status;Need for lift equipment PT Visit Diagnosis: Unsteadiness on feet (R26.81);Other abnormalities of gait and mobility (R26.89);Repeated falls (R29.6);History of falling (Z91.81);Difficulty in walking, not elsewhere classified (R26.2);Pain Pain - Right/Left: Left Pain - part of body: Leg     Time: 1779-3903 PT Time Calculation (min) (ACUTE ONLY): 34 min  Charges:  $Therapeutic Activity: 23-37 mins                     [place] Pager [phone] Office [place] 04/05/2019, 12:14 PM

## 2019-04-05 NOTE — Progress Notes (Addendum)
  Progress Note    04/05/2019 8:32 AM 3 Days Post-Op  Subjective:  Pain during dressing change this morning   Vitals:   04/04/19 2306 04/05/19 0311  BP: 132/85 137/85  Pulse: 85 88  Resp: 14 14  Temp: 98.1 F (36.7 C) 97.8 F (36.6 C)  SpO2: 95% 96%   Physical Exam: Lungs:  Non labored Incisions:  L AKA incision with healthy appearing wound bed, no purulence or sign of infection currently Abdomen:  Soft Neurologic: A&O  CBC    Component Value Date/Time   WBC 13.5 (H) 04/04/2019 0422   RBC 3.22 (L) 04/04/2019 0422   HGB 9.9 (L) 04/04/2019 0422   HCT 33.0 (L) 04/04/2019 0422   PLT 385 04/04/2019 0422   MCV 102.5 (H) 04/04/2019 0422   MCH 30.7 04/04/2019 0422   MCHC 30.0 04/04/2019 0422   RDW 13.6 04/04/2019 0422   LYMPHSABS 4.8 (H) 04/04/2019 0422   MONOABS 1.0 04/04/2019 0422   EOSABS 0.3 04/04/2019 0422   BASOSABS 0.1 04/04/2019 0422    BMET    Component Value Date/Time   NA 138 04/04/2019 0422   K 4.5 04/04/2019 0422   CL 109 04/04/2019 0422   CO2 22 04/04/2019 0422   GLUCOSE 90 04/04/2019 0422   BUN 15 04/04/2019 0422   CREATININE 0.73 04/04/2019 0422   CREATININE 0.83 07/16/2013 1105   CALCIUM 8.6 (L) 04/04/2019 0422   GFRNONAA >60 04/04/2019 0422   GFRNONAA >60 07/29/2011 1220   GFRAA >60 04/04/2019 0422   GFRAA >60 07/29/2011 1220    INR    Component Value Date/Time   INR 1.1 04/02/2019 0236     Intake/Output Summary (Last 24 hours) at 04/05/2019 4709 Last data filed at 04/05/2019 0326 Gross per 24 hour  Intake 1353 ml  Output 530 ml  Net 823 ml     Assessment/Plan:  58 y.o. female is s/p Excisional debridement of left above-the-knee amputation (skin, subcutaneous tissue, and fascia) Placement of VAC 3 days post op  Wound vac removed this morning; dressed with wet to dry Will consult wound nurse to re-apply wound vac for complex wound Wound bed appearing healthy, some granulation Continue MWF vac changes Will discuss anticoagulation  with Dr. Suzette Battiest, PA-C [facility] [phone] 04/05/2019 8:32 AM  Agree with above.  Will plan to d/c to SNF when pt can tolerate VAC changes without IV pain meds.  Ruta Hinds, MD [facility] [place] Office: [phone] Pager: [phone]

## 2019-04-05 NOTE — Progress Notes (Signed)
Patient asked for nicotine patch. She states she has not had a cigarette in 3 weeks. Patient has order for nicotine patch prn daily. Patch placed on a clean area of her right upper arm. Will continue to monitor. Lajoyce Corners, RN

## 2019-04-05 NOTE — TOC Progression Note (Addendum)
Transition of Care [facility]) - Progression Note    Patient Details  Name: Alisha [name] MRN: [id number] Date of Birth: 22-Jul-1961  Transition of Care [facility]) CM/SW [place], [place] Phone Number: 04/05/2019, 4:14 PM  Clinical Narrative:    CSW received consult for SNF placement. CSW visited with the patient at bedside she was alert and oriented. Patient states she is agreeable to short term rehab at [facility]. Patient had previous been to [facility] and CSW to inquire to see if she can return. Patient shared she was reported smoking at the facility but states it was someone else and not her.Patient states she was unfamiliar with other SNF facilities. CSW gave the patient medicare.gov SNF listings for review(copy saved in epic) for other possible choices . CSW explained CSW will inform of bed offers once they are available.   Patient readmission risk assessment completed. Patient states no concerns are barriers during screening.   Patient is realistic regarding therapy needs and expressed being hopeful for SNF placement. Patient expressed understanding of CSW role and discharge process as well as medical condition. No questions/concerns about plan or treatment at this time.  PSARR pending    Expected Discharge Plan: [place] Barriers to Discharge: Continued Medical Work up, No SNF bed  Expected Discharge Plan and Services Expected Discharge Plan: [place] In-house Referral: Clinical Social Work Discharge Planning Services: Other - See comment(Will provide substance abuse resources) Post Acute Care Choice: NA Living arrangements for the past 2 months: [place] Expected Discharge Date: 03/31/19               DME Arranged: N/A DME Agency: NA HH Arranged: NA     Social Determinants of Health (SDOH) Interventions    Readmission Risk Interventions Readmission Risk Prevention Plan 04/05/2019  Transportation Screening  Complete  PCP or Specialist Appt within 3-5 Days Complete  HRI or [place] Complete  Social Work Consult for [place] Planning/Counseling Complete  Palliative Care Screening Complete  Medication Review Press photographer) Complete  Some recent data might be hidden

## 2019-04-05 NOTE — Care Management Important Message (Signed)
Important Message  Patient Details  Name: Alisha [name] MRN: [id number] Date of Birth: 11-Aug-1961   Medicare Important Message Given:  Yes    Termaine Roupp [address] 04/05/2019, 4:13 PM

## 2019-04-06 LAB — BASIC METABOLIC PANEL
Anion gap: 10 (ref 5–15)
BUN: 13 mg/dL (ref 6–20)
CO2: 23 mmol/L (ref 22–32)
Calcium: 9.1 mg/dL (ref 8.9–10.3)
Chloride: 106 mmol/L (ref 98–111)
Creatinine, Ser: 0.83 mg/dL (ref 0.44–1.00)
GFR calc Af Amer: >60 mL/min (ref >60)
GFR calc non Af Amer: >60 mL/min (ref >60)
Glucose, Bld: 98 mg/dL (ref 70–99)
Potassium: 4 mmol/L (ref 3.5–5.1)
Sodium: 139 mmol/L (ref 135–145)

## 2019-04-06 LAB — CBC WITH DIFFERENTIAL/PLATELET
Abs Immature Granulocytes: 0.15 10*3/uL — ABNORMAL HIGH (ref 0.00–0.07)
Basophils Absolute: 0.1 10*3/uL (ref 0.0–0.1)
Basophils Relative: 1 %
Eosinophils Absolute: 0.4 10*3/uL (ref 0.0–0.5)
Eosinophils Relative: 3 %
HCT: 31.1 % — ABNORMAL LOW (ref 36.0–46.0)
Hemoglobin: 10 g/dL — ABNORMAL LOW (ref 12.0–15.0)
Immature Granulocytes: 1 %
Lymphocytes Relative: 32 %
Lymphs Abs: 3.6 10*3/uL (ref 0.7–4.0)
MCH: 31.8 pg (ref 26.0–34.0)
MCHC: 32.2 g/dL (ref 30.0–36.0)
MCV: 99 fL (ref 80.0–100.0)
Monocytes Absolute: 0.8 10*3/uL (ref 0.1–1.0)
Monocytes Relative: 7 %
Neutro Abs: 6.4 10*3/uL (ref 1.7–7.7)
Neutrophils Relative %: 56 %
Platelets: 346 10*3/uL (ref 150–400)
RBC: 3.14 MIL/uL — ABNORMAL LOW (ref 3.87–5.11)
RDW: 14.2 % (ref 11.5–15.5)
WBC: 11.4 10*3/uL — ABNORMAL HIGH (ref 4.0–10.5)
nRBC: 0.2 % (ref 0.0–0.2)

## 2019-04-06 LAB — GLUCOSE, CAPILLARY: Glucose-Capillary: 159 mg/dL — ABNORMAL HIGH (ref 70–99)

## 2019-04-06 LAB — TSH: TSH: 13.287 u[IU]/mL — ABNORMAL HIGH (ref 0.350–4.500)

## 2019-04-06 LAB — MAGNESIUM: Magnesium: 1.9 mg/dL (ref 1.7–2.4)

## 2019-04-06 MED ORDER — CARVEDILOL 6.25 MG PO TABS
6.2500 mg | ORAL_TABLET | Freq: Two times a day (BID) | ORAL | Status: DC
  Administered 2019-04-07 – 2019-04-08 (×3): 6.25 mg via ORAL
  Filled 2019-04-06 (×3): qty 1

## 2019-04-06 MED ORDER — HYDROCHLOROTHIAZIDE 12.5 MG PO CAPS
12.5000 mg | ORAL_CAPSULE | Freq: Every day | ORAL | Status: DC
  Administered 2019-04-07 – 2019-04-08 (×2): 12.5 mg via ORAL
  Filled 2019-04-06 (×2): qty 1

## 2019-04-06 MED ORDER — LOSARTAN POTASSIUM 25 MG PO TABS
25.0000 mg | ORAL_TABLET | Freq: Every day | ORAL | Status: DC
  Administered 2019-04-07 – 2019-04-08 (×2): 25 mg via ORAL
  Filled 2019-04-06 (×2): qty 1

## 2019-04-06 NOTE — TOC Progression Note (Signed)
Transition of Care [facility]) - Progression Note    Patient Details  Name: Alisha [name] MRN: [id number] Date of Birth: 11-04-1961  Transition of Care [facility]) CM/SW [place], [place] Phone Number: 04/06/2019, 12:11 PM  Clinical Narrative:     Patient has accepted bed offer at [facility]. CSW has provided the patient with admission paperwork for SNF for signatures. CSW will fax completed paperwork once completed. CSW informed the SNF per MD, possible discharge tomorrow.  Expected Discharge Plan: Skilled Nursing Facility Barriers to Discharge: Continued Medical Work up, No SNF bed  Expected Discharge Plan and Services Expected Discharge Plan: [place] In-house Referral: Clinical Social Work Discharge Planning Services: Other - See comment(Will provide substance abuse resources) Post Acute Care Choice: NA Living arrangements for the past 2 months: [place] Expected Discharge Date: 03/31/19               DME Arranged: N/A DME Agency: NA HH Arranged: NA     Social Determinants of Health (SDOH) Interventions    Readmission Risk Interventions Readmission Risk Prevention Plan 04/05/2019  Transportation Screening Complete  PCP or Specialist Appt within 3-5 Days Complete  HRI or [place] Complete  Social Work Consult for [place] Planning/Counseling Complete  Palliative Care Screening Complete  Medication Review Press photographer) Complete  Some recent data might be hidden

## 2019-04-06 NOTE — Progress Notes (Addendum)
  Progress Note    04/06/2019 8:52 AM 4 Days Post-Op  Subjective:  No complaints this morning  afebrile  Vitals:   04/06/19 0838 04/06/19 0840  BP: 104/83 127/82  Pulse: 82 84  Resp: 11 12  Temp:    SpO2: 98% 97%    Physical Exam: General:  No distress Lungs:  Non labored Incisions:  Left AKA stump with vac with good seal   CBC    Component Value Date/Time   WBC 11.4 (H) 04/06/2019 0346   RBC 3.14 (L) 04/06/2019 0346   HGB 10.0 (L) 04/06/2019 0346   HCT 31.1 (L) 04/06/2019 0346   PLT 346 04/06/2019 0346   MCV 99.0 04/06/2019 0346   MCH 31.8 04/06/2019 0346   MCHC 32.2 04/06/2019 0346   RDW 14.2 04/06/2019 0346   LYMPHSABS 3.6 04/06/2019 0346   MONOABS 0.8 04/06/2019 0346   EOSABS 0.4 04/06/2019 0346   BASOSABS 0.1 04/06/2019 0346    BMET    Component Value Date/Time   NA 139 04/06/2019 0346   K 4.0 04/06/2019 0346   CL 106 04/06/2019 0346   CO2 23 04/06/2019 0346   GLUCOSE 98 04/06/2019 0346   BUN 13 04/06/2019 0346   CREATININE 0.83 04/06/2019 0346   CREATININE 0.83 07/16/2013 1105   CALCIUM 9.1 04/06/2019 0346   GFRNONAA >60 04/06/2019 0346   GFRNONAA >60 07/29/2011 1220   GFRAA >60 04/06/2019 0346   GFRAA >60 07/29/2011 1220    INR    Component Value Date/Time   INR 1.1 04/02/2019 0236     Intake/Output Summary (Last 24 hours) at 04/06/2019 0852 Last data filed at 04/06/2019 0600 Gross per 24 hour  Intake 560 ml  Output 1325 ml  Net -765 ml     Assessment:  58 y.o. female is s/p:  Excisional debridement of left above-the-knee amputation (skin, subcutaneous tissue, and fascia)  4 Days Post-Op  Plan: -wound vac with good seal.  -will change vac again tomorrow.  Pt will need IV pain med prior to change -appreciate WOC with assistance -DVT prophylaxis:  Continue heparin gtt until we know further if she will need further debridements.    [name], PA-C [facility] [phone] 04/06/2019 8:52 AM  Agree with  above.  VAC change tomorrow.  To SnF when can undergo VAC Change without IV pain meds  Stop IV antibiotics at this time open wound with no evidence invasive infection  Ruta Hinds, MD [facility] [place]: [phone] Pager: [phone]

## 2019-04-06 NOTE — Progress Notes (Signed)
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 She was seen in the emergency department after this fall, the site appeared to be intact per documentation, she was given a small fluid bolus for low blood pressure, and returned to her nursing facility. Since that time, there has been increasing pain and increasingly purulent and malodorous drainage.  ED Course:Upon arrival to the ED, patient is found to be afebrile, saturating well on room air, hypotensive initially with blood pressure 86/65, and normal heart rate and respirations.EKG features a sinus rhythm. Chemistry panel is unremarkable and CBC notable for leukocytosis to 12,000. Lactic acid is reassuringly normal. Patient was given 500 cc normal saline, fentanyl, vancomycin, Zosyn, and clindamycin in the ED. Vascular surgery was consulted and is taking the patient to the OR for debridement and washout, anticipates need for additional debridements during this hospitalization, and recommends admission to the medical service.    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) Active Problems:   TOBACCO ABUSE   Depression with  anxiety   Peripheral vascular disease (HCC)   Venous thromboembolism   Chronic combined systolic and diastolic CHF (congestive heart failure) (HCC)  Left AKA stump infection complicated by peripheral artery disease: -Status post debridement on 03/29/2019  And Status post excisional debridement of the left above-knee amputation and placement of wound VAC4/3 by vascular surgery  -she has been on vanc/zosyn since admission, Wound culture is growing multiple organisms, I have discussed with vascular surgery Dr Oneida Alar on 4/7 who is oked to stop abx  -she is off heparin drip, xarelto restarted on 4/4 -on asa/statin,  -patient now agrees to SNF placement, social worker consulted  -Wound vac/wound care orders per vascular surgery -Per vascular surgery Dr Oneida Alar, patient still required iv pain meds for dressing changes and vac changes, likely able to discharge mid week if pain is able to under control with oral meds   History of DVT on Xarelto:  Was on heparin drip  Xarelto restarted .  Chronic combined systolic and diastolic CHF: -Last 2D echo done on in February 2019 revealed LVEF 35 to 40% with grade 1 diastolic dysfunction -Stable. No CHF symptoms.  -home meds coreg/losartan/hctz held since admission due to hypotension, now bp improving, restarts these meds at a lower dose.  Hypertension  - BP 85/65 on arrival to ED, home bp meds held since admission -bp now stable, gradually restart bp meds, started lower dose of  coreg , losartan/hctz  COPD: Lung clears, on room air, Stable.   Chronic tobacco use disorder: Counseled.   Code Status: full  Family Communication: patient   Disposition Plan:  SNF bed is available,  she can be discharged to snf  once cleared by vascular surgery  Consultants:  Vascular surgery  Procedures:  Status post debridement on 03/29/2019    Status post excisional debridement of the left above-knee amputation and placement of wound VAC4/3 by  vascular surgery   Antibiotics:  Vanc/zosyn since admission to 4/7   Objective: BP 127/82 (BP Location: Right Arm)    Pulse 84    Temp 97.8 F (36.6 C) (Oral)    Resp 12    Ht 5\' 4"  (1.626 m)    Wt 87 kg    LMP 11/22/2015    SpO2 97%    BMI 32.91 kg/m   Intake/Output Summary (Last 24 hours) at 04/06/2019 0901 Last data filed at 04/06/2019 0600 Gross per 24 hour  Intake 560 ml  Output 1325 ml  Net -765 ml   Filed Weights   04/05/19 0308 04/06/19 0435 04/06/19 0520  Weight: 87.7 kg 86.6 kg 87 kg    Exam: Patient is examined daily including today on 04/06/2019, exams remain the same as of yesterday except that has changed    General:  NAD  Cardiovascular: RRR  Respiratory: CTABL  Abdomen: Soft/ND/NT, positive BS  Musculoskeletal: left aka with wound vac on , right leg No Edema  Neuro: alert, oriented   Data Reviewed: Basic Metabolic Panel: Recent Labs  Lab 04/02/19 0236 04/03/19 0220 04/04/19 0422 04/06/19 0346  NA 139 141 138 139  K 4.1 4.3 4.5 4.0  CL 107 108 109 106  CO2 25 24 22 23   GLUCOSE 132* 134* 90 98  BUN 15 14 15 13   CREATININE 0.79 0.73 0.73 0.83  CALCIUM 8.5* 8.7* 8.6* 9.1  MG  --   --   --  1.9   Liver Function Tests: No results for input(s): AST, ALT, ALKPHOS, BILITOT, PROT, ALBUMIN in the last 168 hours. No results for input(s): LIPASE, AMYLASE in the last 168 hours. No results for input(s): AMMONIA in the last 168 hours. CBC: Recent Labs  Lab 03/31/19 0740 04/02/19 0236 04/03/19 0220 04/04/19 0422 04/06/19 0346  WBC 11.4* 11.0* 15.6* 13.5* 11.4*  NEUTROABS  --   --  10.7* 7.0 6.4  HGB 9.3* 8.9* 9.4* 9.9* 10.0*  HCT 30.0* 29.2* 31.1* 33.0* 31.1*  MCV 97.4 99.0 99.7 102.5* 99.0  PLT 334 341 367 385 346   Cardiac Enzymes:   No results for input(s): CKTOTAL, CKMB, CKMBINDEX, TROPONINI in the last 168 hours. BNP (last 3 results) No results for input(s): BNP in the last 8760 hours.  ProBNP (last 3 results) No results for input(s):  PROBNP in the last 8760 hours.  CBG: Recent Labs  Lab 03/31/19 0637 04/03/19 0642 04/04/19 0600 04/05/19 0632 04/06/19 0623  GLUCAP 112* 136* 112* 130* 159*    Recent Results (from the past 240 hour(s))  Wound or Superficial Culture     Status: Abnormal   Collection Time: 03/29/19 12:12 PM  Result Value Ref Range Status   Specimen Description WOUND LEFT LEG  Final   Special Requests NONE  Final   Gram Stain   Final    RARE WBC PRESENT, PREDOMINANTLY PMN ABUNDANT GRAM POSITIVE COCCI ABUNDANT GRAM NEGATIVE RODS ABUNDANT GRAM POSITIVE RODS Performed at [facility], [place] [address] [place], [place] [address]    Culture MULTIPLE ORGANISMS PRESENT, NONE PREDOMINANT (A)  Final   Report Status 03/31/2019 FINAL  Final     Studies: No results found.  Scheduled Meds:  aspirin EC  81 mg Oral Daily   atorvastatin  40 mg Oral QHS  carvedilol  12.5 mg Oral BID WC   DULoxetine  60 mg Oral BID   gabapentin  1,600 mg Oral QHS   gabapentin  800 mg Oral BID   hydrOXYzine  50 mg Oral QHS   multivitamin with minerals  1 tablet Oral Daily   rivaroxaban  10 mg Oral Daily   sodium chloride flush  3 mL Intravenous Q12H   sodium chloride flush  3 mL Intravenous Q12H    Continuous Infusions:  sodium chloride     piperacillin-tazobactam (ZOSYN)  IV 3.375 g (04/06/19 0432)   vancomycin 1,250 mg (04/05/19 1650)     Time spent: 4mins I have personally reviewed and interpreted on  04/06/2019 daily labs, tele strips, imagings as discussed above under date review session and assessment and plans.  I reviewed all nursing notes, pharmacy notes, consultant notes,  vitals, pertinent old records  I have discussed plan of care as described above with RN , patient on 04/06/2019   Florencia Reasons MD, PhD  [facility] Pager [phone]. If 7PM-7AM, please contact night-coverage at [email or web address], password [facility] 04/06/2019, 9:01 AM  LOS: 8 days

## 2019-04-07 ENCOUNTER — Telehealth: Payer: Self-pay | Admitting: Vascular Surgery

## 2019-04-07 LAB — CBC
HCT: 32.1 % — ABNORMAL LOW (ref 36.0–46.0)
Hemoglobin: 10.4 g/dL — ABNORMAL LOW (ref 12.0–15.0)
MCH: 32.2 pg (ref 26.0–34.0)
MCHC: 32.4 g/dL (ref 30.0–36.0)
MCV: 99.4 fL (ref 80.0–100.0)
Platelets: 346 10*3/uL (ref 150–400)
RBC: 3.23 MIL/uL — ABNORMAL LOW (ref 3.87–5.11)
RDW: 14.6 % (ref 11.5–15.5)
WBC: 11.5 10*3/uL — ABNORMAL HIGH (ref 4.0–10.5)
nRBC: 0 % (ref 0.0–0.2)

## 2019-04-07 LAB — BASIC METABOLIC PANEL
Anion gap: 7 (ref 5–15)
BUN: 17 mg/dL (ref 6–20)
CO2: 22 mmol/L (ref 22–32)
Calcium: 9.1 mg/dL (ref 8.9–10.3)
Chloride: 108 mmol/L (ref 98–111)
Creatinine, Ser: 0.8 mg/dL (ref 0.44–1.00)
GFR calc Af Amer: >60 mL/min (ref >60)
GFR calc non Af Amer: >60 mL/min (ref >60)
Glucose, Bld: 87 mg/dL (ref 70–99)
Potassium: 4.5 mmol/L (ref 3.5–5.1)
Sodium: 137 mmol/L (ref 135–145)

## 2019-04-07 LAB — GLUCOSE, CAPILLARY: Glucose-Capillary: 91 mg/dL (ref 70–99)

## 2019-04-07 NOTE — Progress Notes (Addendum)
  Progress Note    04/07/2019 8:25 AM 5 Days Post-Op  Subjective:  No new complaints this morning.  Not looking forward to dressing change today   Vitals:   04/07/19 0453 04/07/19 0802  BP: 132/81 126/81  Pulse: 89 90  Resp:  18  Temp: 98 F (36.7 C) 98.4 F (36.9 C)  SpO2: 97% 98%    Physical Exam: Incisions:  Vac left in place   CBC    Component Value Date/Time   WBC 11.5 (H) 04/07/2019 0240   RBC 3.23 (L) 04/07/2019 0240   HGB 10.4 (L) 04/07/2019 0240   HCT 32.1 (L) 04/07/2019 0240   PLT 346 04/07/2019 0240   MCV 99.4 04/07/2019 0240   MCH 32.2 04/07/2019 0240   MCHC 32.4 04/07/2019 0240   RDW 14.6 04/07/2019 0240   LYMPHSABS 3.6 04/06/2019 0346   MONOABS 0.8 04/06/2019 0346   EOSABS 0.4 04/06/2019 0346   BASOSABS 0.1 04/06/2019 0346    BMET    Component Value Date/Time   NA 137 04/07/2019 0240   K 4.5 04/07/2019 0240   CL 108 04/07/2019 0240   CO2 22 04/07/2019 0240   GLUCOSE 87 04/07/2019 0240   BUN 17 04/07/2019 0240   CREATININE 0.80 04/07/2019 0240   CREATININE 0.83 07/16/2013 1105   CALCIUM 9.1 04/07/2019 0240   GFRNONAA >60 04/07/2019 0240   GFRNONAA >60 07/29/2011 1220   GFRAA >60 04/07/2019 0240   GFRAA >60 07/29/2011 1220    INR    Component Value Date/Time   INR 1.1 04/02/2019 0236     Intake/Output Summary (Last 24 hours) at 04/07/2019 0825 Last data filed at 04/07/2019 0801 Gross per 24 hour  Intake 860 ml  Output 650 ml  Net 210 ml     Assessment/Plan:  58 y.o. female is s/p left above knee amputation  5 Days Post-Op  - WOC RN vac change later today; please put a picture on her chart if no one is available during dressing change - Xarelto restarted - ok for discharge to SNF when no longer requiring IV pain medication for vac change   Dagoberto Ligas, PA-C [facility] [phone] 04/07/2019 8:25 AM   I have examined the patient, reviewed and agree with above.  Curt Jews, MD 04/07/2019 8:36 AM

## 2019-04-07 NOTE — Plan of Care (Signed)

## 2019-04-07 NOTE — Telephone Encounter (Signed)
-----   Message from Dagoberto Ligas, PA-C sent at 04/07/2019 12:45 PM EDT -----  Can you schedule an appt for this pt in about 2-3 weeks with Dr. Oneida Alar.  L AKA with wound vac. Thanks, [facility]

## 2019-04-07 NOTE — Progress Notes (Signed)
PROGRESS NOTE    Alisha [name]  MCN:[id number] DOB: [birth year] DOA: 03/29/2019 PCP: Nolene Ebbs, MD   Brief Narrative: 58 y.o.femalewith medical history significant forbipolar disorder, PAD with ischemic LLE status post left AKA on 02/16/2019, bipolar disorder, chronic combined systolic and diastolic CHF, and tobacco abuse, presenting from a nursing facility for evaluation of pain and drainage at the AKA stump.Patient reported severe pain involving the left leg in the ED.patient had fallen 1 week prior to presentation at the nursing facility when attempting to get out of her wheelchair with the wheels unlocked, fell onto the AKA stump, and a couple staples came out per report of nursing facility personnel. She was seen in the emergency department after this fall, the site appeared to be intact per documentation, she was given a small fluid bolus for low blood pressure, and returned to her nursing facility. Since that time, there has been increasing pain and increasingly purulent and malodorous drainage.  ED Course:Upon arrival to the ED, patient is found to be afebrile, saturating well on room air, hypotensive initially with blood pressure 86/65, and normal heart rate and respirations.EKG features a sinus rhythm. Chemistry panel is unremarkable and CBC notable for leukocytosis to 12,000. Lactic acid is reassuringly normal. Patient was given 500 cc normal saline, fentanyl, vancomycin, Zosyn, and clindamycin in the ED. Vascular surgery was consulted and is taking the patient to the OR for debridement and washout, anticipates need for additional debridements during this hospitalization, and recommends admission to the medical service  Subjective: Patient complains of ongoing pain.  Complains of pain at the left stump site, was needing IV fentanyl for dressing change. No acute events overnight.  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  Was on Vanco/Zosyn since admission, wound culture grew multiple organisms and after discussion with vascular surgery antibiotic is stopped on 4/7.  Patient on ASA/statin, Xarelto. Wound VAC dressing change MWF as per vascular surgery, per vascular surgery okay to discharge patient to skilled nursing facility once not needing IV FENTANYL during VAC change. Will stop fantanyl and cont oral Norco which she is taking 2 tablets every 4 as needed. She endorsed significant pain today.  History of DVT, continue Xarelto or heparin drip.  Tobacco abuse, cessation advised.  COPD- stable  Depression with anxiety: Appears anxious, on Valium, Cymbalta and Neurontin and prn valium.  Continue and continue supportive care.  Hypertension: Blood pressure controlled, continue Coreg, losartan, HCTZ. Had Hypotension on admission.  Chronic combined systolic and diastolic CHF: Compensated.  Continue HCTZ.   DVT prophylaxis: xarelto Code Status: FULL Family Communication:  Offered to call family-daughter, she declined "just leave her alone".  Agreeable for SNF. Disposition Plan: remains inpatient pending clinical improvement. SNF bed available, d/c once okay w vascular.  Consultants:  vascular sx  Procedures:  Status post debridement on 03/29/2019    Status post excisional debridement of the left above-knee amputation and placement of wound VAC4/3 by vascular surgery   Antimicrobials: Anti-infectives (From admission, onward)   Start     Dose/Rate Route Frequency Ordered Stop   03/30/19 2100  vancomycin (VANCOCIN) 1,250 mg in sodium chloride 0.9 % 250 mL IVPB  Status:  Discontinued     1,250 mg 166.7 mL/hr over 90 Minutes Intravenous Every 24 hours 03/29/19 2026 04/06/19 0904  03/29/19 2030  vancomycin (VANCOCIN) 1,750 mg in sodium chloride 0.9 % 500 mL IVPB      1,750 mg 250 mL/hr over 120 Minutes Intravenous  Once 03/29/19 2026 03/29/19 2355   03/29/19 2030  piperacillin-tazobactam (ZOSYN) IVPB 3.375 g  Status:  Discontinued     3.375 g 12.5 mL/hr over 240 Minutes Intravenous Every 8 hours 03/29/19 2026 04/06/19 0904   03/29/19 1200  clindamycin (CLEOCIN) IVPB 600 mg     600 mg 100 mL/hr over 30 Minutes Intravenous  Once 03/29/19 1157 03/29/19 1252       Objective: Vitals:   04/06/19 1946 04/06/19 2351 04/07/19 0453 04/07/19 0802  BP: 113/77 (!) 146/98 132/81 126/81  Pulse: 81 83 89 90  Resp: 17 17  18   Temp: 98.1 F (36.7 C) (!) 97.5 F (36.4 C) 98 F (36.7 C) 98.4 F (36.9 C)  TempSrc: Oral Oral Oral Oral  SpO2: 99% 98% 97% 98%  Weight:   85 kg   Height:        Intake/Output Summary (Last 24 hours) at 04/07/2019 1157 Last data filed at 04/07/2019 0801 Gross per 24 hour  Intake 620 ml  Output 650 ml  Net -30 ml   Filed Weights   04/06/19 0435 04/06/19 0520 04/07/19 0453  Weight: 86.6 kg 87 kg 85 kg   Weight change: -1.6 kg  Body mass index is 32.17 kg/m.  Intake/Output from previous day: 04/07 0701 - 04/08 0700 In: 53 [P.O.:720] Out: 650 [Urine:600; Drains:50] Intake/Output this shift: Total I/O In: 140 [P.O.:140] Out: -   Examination:  General exam: Appears calm and Older than stated age, not in acute distress. HEENT:PERRL,Oral mucosa moist, Ear/Nose normal on gross exam Respiratory system: Bilateral equal air entry, normal vesicular breath sounds, no wheezes or crackles  Cardiovascular system: S1 & S2 heard,No JVD, murmurs. Gastrointestinal system: Abdomen is  soft, non tender, non distended, BS +  Nervous System:Alert and oriented. No focal neurological deficits/moving extremities, sensation intact. Extremities: left leg aka sump in VAC. Rt leg no edema. Skin: No rashes, lesions, no icterus MSK: Normal muscle bulk,tone ,power  Medications:  Scheduled Meds: . aspirin EC  81 mg Oral Daily  .  atorvastatin  40 mg Oral QHS  . carvedilol  6.25 mg Oral BID WC  . DULoxetine  60 mg Oral BID  . gabapentin  1,600 mg Oral QHS  . gabapentin  800 mg Oral BID  . hydrochlorothiazide  12.5 mg Oral Daily  . hydrOXYzine  50 mg Oral QHS  . losartan  25 mg Oral Daily  . multivitamin with minerals  1 tablet Oral Daily  . rivaroxaban  10 mg Oral Daily  . sodium chloride flush  3 mL Intravenous Q12H  . sodium chloride flush  3 mL Intravenous Q12H   Continuous Infusions: . sodium chloride      Data Reviewed: I have personally reviewed following labs and imaging studies  CBC: Recent Labs  Lab 04/02/19 0236 04/03/19 0220 04/04/19 0422 04/06/19 0346 04/07/19 0240  WBC 11.0* 15.6* 13.5* 11.4* 11.5*  NEUTROABS  --  10.7* 7.0 6.4  --   HGB 8.9* 9.4* 9.9* 10.0* 10.4*  HCT 29.2* 31.1* 33.0* 31.1* 32.1*  MCV 99.0 99.7 102.5* 99.0 99.4  PLT 341 367 385 346 338   Basic Metabolic Panel: Recent Labs  Lab 04/02/19 0236 04/03/19 0220 04/04/19 0422 04/06/19 0346 04/07/19 0240  NA 139 141 138 139 137  K 4.1 4.3 4.5 4.0 4.5  CL  107 108 109 106 108  CO2 25 24 22 23 22   GLUCOSE 132* 134* 90 98 87  BUN 15 14 15 13 17   CREATININE 0.79 0.73 0.73 0.83 0.80  CALCIUM 8.5* 8.7* 8.6* 9.1 9.1  MG  --   --   --  1.9  --    GFR: Estimated Creatinine Clearance: 82.8 mL/min (by C-G formula based on SCr of 0.8 mg/dL). Liver Function Tests: No results for input(s): AST, ALT, ALKPHOS, BILITOT, PROT, ALBUMIN in the last 168 hours. No results for input(s): LIPASE, AMYLASE in the last 168 hours. No results for input(s): AMMONIA in the last 168 hours. Coagulation Profile: Recent Labs  Lab 04/02/19 0236  INR 1.1   Cardiac Enzymes: No results for input(s): CKTOTAL, CKMB, CKMBINDEX, TROPONINI in the last 168 hours. BNP (last 3 results) No results for input(s): PROBNP in the last 8760 hours. HbA1C: No results for input(s): HGBA1C in the last 72 hours. CBG: Recent Labs  Lab 04/03/19 0642 04/04/19  0600 04/05/19 0632 04/06/19 0623 04/07/19 0626  GLUCAP 136* 112* 130* 159* 91   Lipid Profile: No results for input(s): CHOL, HDL, LDLCALC, TRIG, CHOLHDL, LDLDIRECT in the last 72 hours. Thyroid Function Tests: Recent Labs    04/06/19 0346  TSH 13.287*   Anemia Panel: No results for input(s): VITAMINB12, FOLATE, FERRITIN, TIBC, IRON, RETICCTPCT in the last 72 hours. Sepsis Labs: No results for input(s): PROCALCITON, LATICACIDVEN in the last 168 hours.  Recent Results (from the past 240 hour(s))  Wound or Superficial Culture     Status: Abnormal   Collection Time: 03/29/19 12:12 PM  Result Value Ref Range Status   Specimen Description WOUND LEFT LEG  Final   Special Requests NONE  Final   Gram Stain   Final    RARE WBC PRESENT, PREDOMINANTLY PMN ABUNDANT GRAM POSITIVE COCCI ABUNDANT GRAM NEGATIVE RODS ABUNDANT GRAM POSITIVE RODS Performed at [facility], [place] [address], [place], [place] [address]    Culture MULTIPLE ORGANISMS PRESENT, NONE PREDOMINANT (A)  Final   Report Status 03/31/2019 FINAL  Final      Radiology Studies: No results found.    LOS: 9 days   Time spent: More than 50% of that time was spent in counseling and/or coordination of care.  Antonieta Pert, MD [facility]  04/07/2019, 11:57 AM

## 2019-04-07 NOTE — Telephone Encounter (Signed)
sch appt spk to pt mld ltr 04/29/2019 1015am p/o MD

## 2019-04-07 NOTE — Progress Notes (Signed)
Physical Therapy Treatment Patient Details Name: Alisha [name] MRN: [id number] DOB: December 29, 1961 Today's Date: 04/07/2019    History of Present Illness 58 yo F presents to ED after fall from [facility] c/o stump pain and drainage. Underwent I&D and VAC placement on 4/3. PMH - PVD, bipolar disorder, CHF, anxiety/depression, fibromyalgia, asthma, HTN, chronic venous TE.     PT Comments    Patient progressing with strengthening exercise for L LE and educated on pivot transfers, though unsuccessful this session due to [facility] not having drop armrests.  Currently remains appropriate for SNF level rehab.  PT to follow acutely.   Follow Up Recommendations  SNF     Equipment Recommendations  Other (comment)(TBA)    Recommendations for Other Services       Precautions / Restrictions Precautions Precautions: Fall Precaution Comments: L AKA w/wound vac Restrictions Weight Bearing Restrictions: Yes LLE Weight Bearing: Non weight bearing    Mobility  Bed Mobility Overal bed mobility: Needs Assistance       Supine to sit: HOB elevated;Supervision Sit to supine: Min guard   General bed mobility comments: up to sit on her own, to supine assist for repositioning with cues  Transfers Overall transfer level: Needs assistance Equipment used: Rolling walker (2 wheeled) Transfers: Sit to/from [facility] Sit to Stand: Mod assist Stand pivot transfers: Min assist       General transfer comment: lifting assist to stand, min A for balance to step/hop to [facility] and back to bed; demonstrated squat-pivot and pt attempting, but unable to perform from [facility] without drop arm  Ambulation/Gait             General Gait Details: gait deferred due to pt reports continues with diarrhea   Stairs             Wheelchair Mobility    Modified Rankin (Stroke Patients Only)       Balance Overall balance assessment: Needs assistance Sitting-balance support: Feet  supported Sitting balance-Leahy Scale: Good     Standing balance support: Bilateral upper extremity supported Standing balance-Leahy Scale: Poor Standing balance comment: walker and min assist for static standing                            Cognition Arousal/Alertness: Awake/alert Behavior During Therapy: WFL for tasks assessed/performed Overall Cognitive Status: Within Functional Limits for tasks assessed                                        Exercises Amputee Exercises Hip ABduction/ADduction: Strengthening;Right;10 reps;Supine Chair Push Up: Strengthening;10 reps;Both;Seated Other Exercises Other Exercises: performed sit<>stand x 5 with cues for hand placement, mod A (for R LE strengthening) Other Exercises: seated on BSC for armchair push ups  Other Exercises: R LE bridge x 5    General Comments        Pertinent Vitals/Pain Faces Pain Scale: Hurts even more Pain Location: lt residual limb Pain Descriptors / Indicators: Discomfort;Sore Pain Intervention(s): Monitored during session;Repositioned    Home Living                      Prior Function            PT Goals (current goals can now be found in the care plan section) Progress towards PT goals: Progressing toward goals    Frequency  Min 2X/week      PT Plan Current plan remains appropriate    Co-evaluation              AM-PAC PT "6 Clicks" Mobility   Outcome Measure  Help needed turning from your back to your side while in a flat bed without using bedrails?: None Help needed moving from lying on your back to sitting on the side of a flat bed without using bedrails?: A Little Help needed moving to and from a bed to a chair (including a wheelchair)?: A Little Help needed standing up from a chair using your arms (e.g., wheelchair or bedside chair)?: A Lot Help needed to walk in hospital room?: A Lot Help needed climbing 3-5 steps with a railing? : Total 6  Click Score: 15    End of Session Equipment Utilized During Treatment: Gait belt Activity Tolerance: Patient tolerated treatment well;Other (comment)(limited due to continues with diarrhea per pt and due to pain after wound vac change) Patient left: in bed;with call bell/phone within reach;with bed alarm set   PT Visit Diagnosis: Unsteadiness on feet (R26.81);Other abnormalities of gait and mobility (R26.89);Repeated falls (R29.6);History of falling (Z91.81);Difficulty in walking, not elsewhere classified (R26.2);Pain Pain - Right/Left: Left Pain - part of body: Leg     Time: 3202-3343 PT Time Calculation (min) (ACUTE ONLY): 28 min  Charges:  $Therapeutic Exercise: 8-22 mins $Therapeutic Activity: 8-22 mins                     Magda Kiel, [place] [facility] [phone] 04/07/2019    Reginia Naas 04/07/2019, 4:27 PM

## 2019-04-07 NOTE — Consult Note (Signed)
[place] Nurse wound follow up Wound type:Left AKA site, NPWT (VAC) dressing change. Premedicated with Fentanyl Measurement: 8 cm x 13 cm x 4 cm  Wound ZRA:QTMAU red  Drainage (amount, consistency, odor) Moderate serosanguinous in canister and bleeds with dressing change.  Periwound: 0.5 cm patch of eschar to anterior leg at wound perimeter. Intact   Dressing procedure/placement/frequency:Filled with one piece black foam and covered with drape.  Seal immediately achieved at 125.  Change M/w/F  Patient states she is going to rehab today.  Discussed with bedside RN, Jerene Pitch to disconnect from device prior to discharge as her rehab facility is less than an hour away.  She can be reconnected to a device upon arrival to rehab.  [place] team will follow.  Domenic Moras MSN, RN, FNP-BC CWON Wound, Ostomy, Continence Nurse Pager [phone]

## 2019-04-07 NOTE — TOC Progression Note (Addendum)
Transition of Care [facility]) - Progression Note    Patient Details  Name: Alisha [name] MRN: [id number] Date of Birth: 02/11/1961  Transition of Care [facility]) CM/SW [place], [place] Phone Number: 04/07/2019, 3:32 PM  Clinical Narrative:    CSW was consulted because the patient was upset ,crying, cursing,screaming because  she could not leave the hospital with her friend, run errands and then go to the SNF. CSW explained she needs to leave with [facility] because she has wound vac, it's not safe, and covid-19. She will have to get her belongings from ALF at another time. After further discussion, patient became less anxious and agreed to continue with current discharge plan.  Patient gave CSW her phone  to talk with her friend. CSW spoke with her friend, Merry Proud and explained; the patient can not leave and run errands before going to SNF, including no visitors at [facility] and she does not need her wheel chair, the facility will have wheel chair. Patient's friend agreed it was best for the patient to remain safe and take care of her wound.     Expected Discharge Plan: Skilled Nursing Facility Barriers to Discharge: Continued Medical Work up, No SNF bed  Expected Discharge Plan and Services Expected Discharge Plan: [place] In-house Referral: Clinical Social Work Discharge Planning Services: Other - See comment(Will provide substance abuse resources) Post Acute Care Choice: NA Living arrangements for the past 2 months: [place] Expected Discharge Date: 03/31/19               DME Arranged: N/A DME Agency: NA HH Arranged: NA     Social Determinants of Health (SDOH) Interventions    Readmission Risk Interventions Readmission Risk Prevention Plan 04/05/2019  Transportation Screening Complete  PCP or Specialist Appt within 3-5 Days Complete  HRI or [place] Complete  Social Work Consult for [place] Planning/Counseling Complete   Palliative Care Screening Complete  Medication Review Press photographer) Complete  Some recent data might be hidden

## 2019-04-08 DIAGNOSIS — F418 Other specified anxiety disorders: Secondary | ICD-10-CM | POA: Diagnosis not present

## 2019-04-08 DIAGNOSIS — S78112D Complete traumatic amputation at level between left hip and knee, subsequent encounter: Secondary | ICD-10-CM | POA: Diagnosis not present

## 2019-04-08 DIAGNOSIS — I739 Peripheral vascular disease, unspecified: Secondary | ICD-10-CM | POA: Diagnosis not present

## 2019-04-08 DIAGNOSIS — F339 Major depressive disorder, recurrent, unspecified: Secondary | ICD-10-CM | POA: Diagnosis present

## 2019-04-08 DIAGNOSIS — T8744 Infection of amputation stump, left lower extremity: Secondary | ICD-10-CM | POA: Diagnosis not present

## 2019-04-08 DIAGNOSIS — I10 Essential (primary) hypertension: Secondary | ICD-10-CM | POA: Diagnosis not present

## 2019-04-08 DIAGNOSIS — Z86718 Personal history of other venous thrombosis and embolism: Secondary | ICD-10-CM | POA: Diagnosis not present

## 2019-04-08 DIAGNOSIS — Z743 Need for continuous supervision: Secondary | ICD-10-CM | POA: Diagnosis not present

## 2019-04-08 DIAGNOSIS — R41841 Cognitive communication deficit: Secondary | ICD-10-CM | POA: Diagnosis present

## 2019-04-08 DIAGNOSIS — M6281 Muscle weakness (generalized): Secondary | ICD-10-CM | POA: Diagnosis not present

## 2019-04-08 DIAGNOSIS — Z4781 Encounter for orthopedic aftercare following surgical amputation: Secondary | ICD-10-CM | POA: Diagnosis not present

## 2019-04-08 DIAGNOSIS — J9601 Acute respiratory failure with hypoxia: Secondary | ICD-10-CM | POA: Diagnosis present

## 2019-04-08 DIAGNOSIS — I5042 Chronic combined systolic (congestive) and diastolic (congestive) heart failure: Secondary | ICD-10-CM | POA: Diagnosis present

## 2019-04-08 DIAGNOSIS — R262 Difficulty in walking, not elsewhere classified: Secondary | ICD-10-CM | POA: Diagnosis not present

## 2019-04-08 DIAGNOSIS — J45909 Unspecified asthma, uncomplicated: Secondary | ICD-10-CM | POA: Diagnosis present

## 2019-04-08 DIAGNOSIS — R5381 Other malaise: Secondary | ICD-10-CM | POA: Diagnosis not present

## 2019-04-08 DIAGNOSIS — I8290 Acute embolism and thrombosis of unspecified vein: Secondary | ICD-10-CM | POA: Diagnosis present

## 2019-04-08 DIAGNOSIS — R279 Unspecified lack of coordination: Secondary | ICD-10-CM | POA: Diagnosis not present

## 2019-04-08 DIAGNOSIS — Z9119 Patient's noncompliance with other medical treatment and regimen: Secondary | ICD-10-CM | POA: Diagnosis not present

## 2019-04-08 DIAGNOSIS — Z89612 Acquired absence of left leg above knee: Secondary | ICD-10-CM | POA: Diagnosis not present

## 2019-04-08 DIAGNOSIS — F419 Anxiety disorder, unspecified: Secondary | ICD-10-CM | POA: Diagnosis present

## 2019-04-08 LAB — GLUCOSE, CAPILLARY: Glucose-Capillary: 113 mg/dL — ABNORMAL HIGH (ref 70–99)

## 2019-04-08 MED ORDER — LOSARTAN POTASSIUM 25 MG PO TABS: 25.0000 mg | ORAL_TABLET | Freq: Every day | ORAL | 3 refills | Status: AC

## 2019-04-08 MED ORDER — HYDROCODONE-ACETAMINOPHEN 5-325 MG PO TABS: 1.0000 | ORAL_TABLET | Freq: Four times a day (QID) | ORAL | 0 refills | Status: DC | PRN

## 2019-04-08 MED ORDER — POLYETHYLENE GLYCOL 3350 17 G PO PACK: 17.0000 g | PACK | Freq: Every day | ORAL | 0 refills | Status: AC | PRN

## 2019-04-08 MED ORDER — CARVEDILOL 12.5 MG PO TABS: 6.2500 mg | ORAL_TABLET | Freq: Two times a day (BID) | ORAL | 1 refills | Status: AC

## 2019-04-08 MED ORDER — DIAZEPAM 2 MG PO TABS: 2.0000 mg | ORAL_TABLET | Freq: Two times a day (BID) | ORAL | 0 refills | Status: AC | PRN

## 2019-04-08 MED ORDER — NICOTINE 7 MG/24HR TD PT24: 7.0000 mg | MEDICATED_PATCH | Freq: Every day | TRANSDERMAL | 0 refills | Status: AC | PRN

## 2019-04-08 MED ORDER — HYDROCHLOROTHIAZIDE 25 MG PO TABS: 12.5000 mg | ORAL_TABLET | Freq: Every day | ORAL | 1 refills | Status: AC

## 2019-04-08 NOTE — Progress Notes (Signed)
  Progress Note    04/08/2019 7:48 AM 6 Days Post-Op  Subjective:  Sitting on side of bed.  Says the vac is giving her a fit.    Afebrile   Vitals:   04/07/19 2356 04/08/19 0458  BP: 108/70 115/79  Pulse: 88 82  Resp: 19 16  Temp: (!) 97.5 F (36.4 C) 97.8 F (36.6 C)  SpO2: 95% 97%    Physical Exam: General:  No distress Lungs:  Non labored Incisions:  Wound vac with good seal.   CBC    Component Value Date/Time   WBC 11.5 (H) 04/07/2019 0240   RBC 3.23 (L) 04/07/2019 0240   HGB 10.4 (L) 04/07/2019 0240   HCT 32.1 (L) 04/07/2019 0240   PLT 346 04/07/2019 0240   MCV 99.4 04/07/2019 0240   MCH 32.2 04/07/2019 0240   MCHC 32.4 04/07/2019 0240   RDW 14.6 04/07/2019 0240   LYMPHSABS 3.6 04/06/2019 0346   MONOABS 0.8 04/06/2019 0346   EOSABS 0.4 04/06/2019 0346   BASOSABS 0.1 04/06/2019 0346    BMET    Component Value Date/Time   NA 137 04/07/2019 0240   K 4.5 04/07/2019 0240   CL 108 04/07/2019 0240   CO2 22 04/07/2019 0240   GLUCOSE 87 04/07/2019 0240   BUN 17 04/07/2019 0240   CREATININE 0.80 04/07/2019 0240   CREATININE 0.83 07/16/2013 1105   CALCIUM 9.1 04/07/2019 0240   GFRNONAA >60 04/07/2019 0240   GFRNONAA >60 07/29/2011 1220   GFRAA >60 04/07/2019 0240   GFRAA >60 07/29/2011 1220    INR    Component Value Date/Time   INR 1.1 04/02/2019 0236     Intake/Output Summary (Last 24 hours) at 04/08/2019 0748 Last data filed at 04/08/2019 0500 Gross per 24 hour  Intake 140 ml  Output 50 ml  Net 90 ml     Assessment:  58 y.o. female is s/p:  Debridement of left above knee amputation    9 Days Post-Op And  Excisional debridement of left above-the-knee amputation (skin, subcutaneous tissue, and fascia) Placement of VAC 6 Days Post-Op  Plan: -pt vac with good seal.  Will plan on wound vac change tomorrow.  Appreciate WOC assistance with this pt. -DVT prophylaxis:  Xarelto -ok for SNF when no longer requiring IV pain med for vac change.    [name], PA-C [facility] [phone] 04/08/2019 7:48 AM

## 2019-04-08 NOTE — TOC Transition Note (Signed)
Transition of Care [facility]) - CM/SW Discharge Note   Patient Details  Name: Alisha [name] MRN: [id number] Date of Birth: October 02, 1961  Transition of Care [facility]) CM/SW Contact:  Ellin Saba Phone Number: [phone] 04/08/2019, 9:54 AM   Clinical Narrative:     Patient will DC to: [facility]  DC Date: 04/08/2019 Family Notified: patient declined to contact family Transport By: Corey Harold  RN, patient, and facility notified of DC. Discharge Summary sent to facility. RN given number for report [phone], room 144 . Ambulance transport requested for patient.   Clinical Social Worker signing off. Thurmond Butts, MSW, [facility] Clinical Social Worker [phone]    Final next level of care: Skilled Nursing Facility Barriers to Discharge: No Barriers Identified   Patient Goals and CMS Choice Patient states their goals for this hospitalization and ongoing recovery are:: get better  CMS Medicare.gov Compare Post Acute Care list provided to:: Patient Choice offered to / list presented to : Patient  Discharge Placement   Existing PASRR number confirmed : ([place] - pt can d/c - psarr pending )          Patient chooses bed at: ([facility]) Patient to be transferred to facility by: [facility]  Name of family member notified: patient declined contacting family Patient and family notified of of transfer: 04/08/19  Discharge Plan and Services In-house Referral: Clinical Social Work Discharge Planning Services: Other - See comment(Will provide substance abuse resources) Post Acute Care Choice: NA          DME Arranged: N/A DME Agency: NA HH Arranged: NA     Social Determinants of Health (SDOH) Interventions     Readmission Risk Interventions Readmission Risk Prevention Plan 04/05/2019  Transportation Screening Complete  PCP or Specialist Appt within 3-5 Days Complete  HRI or [place] Complete  Social Work Consult for [place] Planning/Counseling Complete   Palliative Care Screening Complete  Medication Review Press photographer) Complete  Some recent data might be hidden

## 2019-04-08 NOTE — Discharge Summary (Signed)
Physician Discharge Summary  Alisha [name] PNT:[id number] DOB: 1961/11/15 DOA: 03/29/2019  PCP: Nolene Ebbs, MD  Admit date: 03/29/2019 Discharge date: 04/08/2019  Admitted From: home Disposition:  SNF  Recommendations for Outpatient Follow-up:  1. Follow up with PCP in 1-2 weeks 2. Please follow-up with your vascular surgeon as instructed 3. Please obtain BMP/CBC in one week 4. 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 She was seen in the emergency department after this fall, the site appeared to be intact per documentation, she was given a small fluid bolus for low blood pressure, and returned to her nursing facility. Since that time, there has been increasing pain and increasingly purulent and malodorous drainage.  ED Course:Upon arrival to the ED, patient is found to be afebrile, saturating well on room air, hypotensive initially with blood pressure 86/65, and normal heart rate and respirations.EKG features a sinus rhythm. Chemistry panel is unremarkable and CBC notable for leukocytosis to 12,000. Lactic acid is reassuringly normal. Patient was given 500 cc normal saline, fentanyl, vancomycin, Zosyn, and clindamycin in  the ED. Vascular surgery was consulted and is taking the patient to the OR for debridement and washout, anticipates need for additional debridements during this hospitalization, and recommends admission to the medical service. Patient is admitted seen by vascular surgery underwent debridement and excision and later wound VAC placement, patient was on antibiotics initially and completed the course.  At this time patient is medically stabilized and her wound VAC needs to be changed Monday Wednesday Friday she has a good seal and she will need to follow-up with vascular surgery as outpatient.  Patient is agreeable with going to skilled nursing facility with oral pain medication and needs to be premedicated before the dressing change.  I have discussed with Dr EARLY from vascular surgery and agreeable with the plan of care.  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  Was on Vanco/Zosyn since admission, wound culture grew multiple organisms and after discussion with vascular surgery antibiotic is stopped on 4/7.  Patient on ASA/statin, Xarelto. Wound VAC dressing change MWF as per vascular surgery, per vascular surgery okay to discharge patient to skilled nursing facility after I discussed and patient agreeable with oral pain management and please dose before dressing change.   History of DVT, continue Xarelto or heparin drip.  Tobacco abuse, cessation advised.  COPD- stable  Depression with  anxiety: Appears anxious, on Valium,  Cymbalta and Neurontin and prn valium.  Continue and continue supportive care.  Hypertension: Blood pressure controlled, her med dose have been cut down as she had hypotension on admission.  She will continue on reduced dose of Core[place] losartan and hydrochlorothiazide  Chronic combined systolic and diastolic CHF: Compensated.  Continue HCTZ.   WOUND CARE NOTE  "[place] Nurse wound follow up Wound type:Left AKA site, NPWT (VAC) dressing change. Premedicated with Fentanyl Measurement: 8 cm x 13 cm x 4 cm  Wound YKD:XIPJA red  Drainage (amount, consistency, odor) Moderate serosanguinous in canister and bleeds with dressing change.  Periwound: 0.5 cm patch of eschar to anterior leg at wound perimeter. Intact   Dressing procedure/placement/frequency:Filled with one piece black foam and covered with drape.  Seal immediately achieved at 125.  Change M/w/F  Patient states she is going to rehab today.  Discussed with bedside RN, Jerene Pitch to disconnect from device prior to discharge as her rehab facility is less than an hour away.  She can be reconnected to a device upon arrival to rehab.  [place] team will follow.  Domenic Moras MSN, RN, FNP-BC CWON Wound, Ostomy, Continence Nurse Pager [phone]"  DVT prophylaxis: xarelto Code Status: FULL Family Communication:  Offered to call family-daughter, she declined "just leave her alone".  Disposition Plan: SNF   Discharge Instructions  Discharge Instructions    Call MD for:  severe uncontrolled pain   Complete by:  As directed    Call MD for:  temperature >100.4   Complete by:  As directed    Diet - low sodium heart healthy   Complete by:  As directed    Discharge instructions   Complete by:  As directed    Please call call MD or return to ER for similar recurring problem, nausea/vomiting, uncontrolled pain, abdominal pain chest pain, shortness of breath, fever. Please follow-up your doctor as instructed in a week time and call the office for  appointment. Please avoid alcohol, smoking, or any other illicit substance.  [place] Nurse recommendations  Wound type:Left AKA site, NPWT (VAC) dressing change. Premedicated with Fentanyl  Dressing procedure/placement/frequency:Filled with one piece black foam and covered with drape.  Seal immediately achieved at 125.  Change M/w/F  Domenic Moras MSN, RN, FNP-BC CWON Wound, Ostomy, Continence Nurse Pager [phone]   Increase activity slowly   Complete by:  As directed      Allergies as of 04/08/2019      Reactions   Propoxyphene N-acetaminophen Anaphylaxis, Swelling   Strawberry Extract Anaphylaxis, Shortness Of Breath   "I go into shock"   Buspar [buspirone] Swelling   "Swells my face"   Grapefruit Extract Other (See Comments)   Interaction with medications she's taking   Tape Itching   Patient says that it itches and swells with some adhesive dressings.      Medication List    STOP taking these medications   oxyCODONE-acetaminophen 5-325 MG tablet Commonly known as:  PERCOCET/ROXICET   potassium chloride 10 MEQ tablet Commonly known as:  K-DUR     TAKE these medications   albuterol 108 (90 Base) MCG/ACT inhaler Commonly known as:  Ventolin HFA Inhale 1-2 puffs into the lungs every 6 (six) hours as needed for wheezing or shortness of breath.   aspirin EC 81 MG tablet Take 81 mg by mouth daily.   atorvastatin 40 MG tablet Commonly known as:  LIPITOR Take 1 tablet (40 mg total) by mouth at bedtime. What changed:  when to take this   carvedilol 12.5 MG tablet Commonly known as:  COREG Take 0.5 tablets (6.25 mg total) by mouth 2 (two) times daily with a meal. What changed:  how much to take   diazepam 2 MG tablet Commonly known as:  VALIUM Take 1 tablet (2 mg total) by mouth every 12 (twelve) hours as needed for up to 4 doses for anxiety. What changed:    medication strength  how much to take  when to take this  reasons to take this   DULoxetine 60 MG  capsule Commonly known as:  CYMBALTA Take 60 mg by mouth 2 (two) times daily.   escitalopram 10 MG tablet Commonly known as:  LEXAPRO Take 10 mg by mouth daily.   gabapentin 800 MG tablet Commonly known as:  NEURONTIN Take 800-1,600 mg by mouth See admin instructions. 800 mg twice daily, and 1600 mg at bedtime   hydrochlorothiazide 25 MG tablet Commonly known as:  HYDRODIURIL Take 0.5 tablets (12.5 mg total) by mouth daily. What changed:  how much to take   HYDROcodone-acetaminophen 5-325 MG tablet Commonly known as:  NORCO/VICODIN Take 1 tablet by mouth every 6 (six) hours as needed for up to 6 doses for severe pain. What changed:  reasons to take this   hydrOXYzine 50 MG tablet Commonly known as:  ATARAX/VISTARIL Take 50 mg by mouth See admin instructions. Take 50 mg by mouth at bedtime and an additional 50 mg two times a day as needed for anxiety   Latuda 120 MG Tabs Generic drug:  Lurasidone HCl Take 80 mg by mouth 2 (two) times daily.   losartan 25 MG tablet Commonly known as:  COZAAR Take 1 tablet (25 mg total) by mouth daily. What changed:    medication strength  how much to take   multivitamin with minerals tablet Take 1 tablet by mouth daily.   nicotine 7 mg/24hr patch Commonly known as:  NICODERM CQ - dosed in mg/24 hr Place 1 patch (7 mg total) onto the skin daily as needed (nicotine replacement).   polyethylene glycol 17 g packet Commonly known as:  MIRALAX / GLYCOLAX Take 17 g by mouth daily as needed for mild constipation.   vitamin E 400 UNIT capsule Generic drug:  vitamin E Take 800 Units by mouth 2 (two) times daily.   Xarelto 10 MG Tabs tablet Generic drug:  rivaroxaban Take 10 mg by mouth daily.            Durable Medical Equipment  (From admission, onward)         Start     Ordered   04/02/19 0849  For home use only DME Negative pressure wound device  Once    Comments:  Wound measures 7 cm in width, 19 cm in length, and 2.5 cm  in depth.  Question Answer Comment  Frequency of dressing change 3 times per week   Length of need 3 Months   Dressing type Foam   Amount of suction 125 mm/Hg   Pressure application Continuous pressure   Supplies 10 canisters and 15 dressings per month for duration of therapy      04/02/19 0848         Follow-up Information    Nolene Ebbs, MD Follow up in 1 week(s).   Specialty:  Internal Medicine Contact information: [address] [place] [address] [phone]        Nahser, Wonda Cheng, MD .   Specialty:  Cardiology Contact  information: [address] [place] [address] [phone]        Elam Dutch, MD Follow up in 2 week(s).   Specialties:  Vascular Surgery, Cardiology Why:  post op follow up Contact information: [address] [place] [address] [phone]          Allergies  Allergen Reactions  . Propoxyphene N-Acetaminophen Anaphylaxis and Swelling  . Strawberry Extract Anaphylaxis and Shortness Of Breath    "I go into shock"  . Buspar [Buspirone] Swelling    "Swells my face"  . Grapefruit Extract Other (See Comments)    Interaction with medications she's taking  . Tape Itching    Patient says that it itches and swells with some adhesive dressings.    Consultations:  Vascular surgery    Procedures/Studies: Dg Femur Min 2 Views Left  Result Date: 03/12/2019 CLINICAL DATA:  [place].  Recent amputation. EXAM: LEFT FEMUR 2 VIEWS COMPARISON:  None. FINDINGS: Evidence of recent mid femoral amputation. The osteotomy site appears normal. No acute fracture. The left hip is normally located. Advanced vascular calcifications for age. IMPRESSION: Expected postoperative changes. No acute fracture or hip dislocation. Electronically Signed   By: Marijo Sanes M.D.   On: 03/12/2019 13:44    Subjective: Resting comfortably at the bedside.  No new complaints.  Agreeable for skilled nursing facility discharge  today.  Discharge Exam: Vitals:   04/07/19 2356 04/08/19 0458  BP: 108/70 115/79  Pulse: 88 82  Resp: 19 16  Temp: (!) 97.5 F (36.4 C) 97.8 F (36.6 C)  SpO2: 95% 97%   Vitals:   04/07/19 2025 04/07/19 2356 04/08/19 0455 04/08/19 0458  BP: (!) 140/94 108/70  115/79  Pulse:  88  82  Resp: 19 19  16   Temp: 97.6 F (36.4 C) (!) 97.5 F (36.4 C)  97.8 F (36.6 C)  TempSrc: Oral Oral  Oral  SpO2:  95%  97%  Weight:   85.1 kg   Height:        General: Pt is alert, awake, not in acute distress Cardiovascular: RRR, S1/S2 +, no rubs, no gallops Respiratory: CTA bilaterally, no wheezing, no rhonchi Abdominal: Soft, NT, ND, bowel sounds + Extremities: no edema, no cyanosis. LEFT LEG AKA stump in wound vac w good seal.   The results of significant diagnostics from this hospitalization (including imaging, microbiology, ancillary and laboratory) are listed below for reference.     Microbiology: Recent Results (from the past 240 hour(s))  Wound or Superficial Culture     Status: Abnormal   Collection Time: 03/29/19 12:12 PM  Result Value Ref Range Status   Specimen Description WOUND LEFT LEG  Final   Special Requests NONE  Final   Gram Stain   Final    RARE WBC PRESENT, PREDOMINANTLY PMN ABUNDANT GRAM POSITIVE COCCI ABUNDANT GRAM NEGATIVE RODS ABUNDANT GRAM POSITIVE RODS Performed at [facility], [place] [address], [place], [place] [address]    Culture MULTIPLE ORGANISMS PRESENT, NONE PREDOMINANT (A)  Final   Report Status 03/31/2019 FINAL  Final     Labs: BNP (last 3 results) No results for input(s): BNP in the last 8760 hours. Basic Metabolic Panel: Recent Labs  Lab 04/02/19 0236 04/03/19 0220 04/04/19 0422 04/06/19 0346 04/07/19 0240  NA 139 141 138 139 137  K 4.1 4.3 4.5 4.0 4.5  CL 107 108 109 106 108  CO2 25 24 22 23 22   GLUCOSE 132* 134* 90 98 87  BUN 15 14 15 13 17   CREATININE 0.79 0.73 0.73 0.83 0.80  CALCIUM 8.5* 8.7* 8.6* 9.1 9.1  MG  --    --   --  1.9  --    Liver Function Tests: No results for input(s): AST, ALT, ALKPHOS, BILITOT, PROT, ALBUMIN in the last 168 hours. No results for input(s): LIPASE, AMYLASE in the last 168 hours. No results for input(s): AMMONIA in the last 168 hours. CBC: Recent Labs  Lab 04/02/19 0236 04/03/19 0220 04/04/19 0422 04/06/19 0346 04/07/19 0240  WBC 11.0* 15.6* 13.5* 11.4* 11.5*  NEUTROABS  --  10.7* 7.0 6.4  --   HGB 8.9* 9.4* 9.9* 10.0* 10.4*  HCT 29.2* 31.1* 33.0* 31.1* 32.1*  MCV 99.0 99.7 102.5* 99.0 99.4  PLT 341 367 385 346 346   Cardiac Enzymes: No results for input(s): CKTOTAL, CKMB, CKMBINDEX, TROPONINI in the last 168 hours. BNP: Invalid input(s): POCBNP CBG: Recent Labs  Lab 04/04/19 0600 04/05/19 0632 04/06/19 0623 04/07/19 0626 04/08/19 0507  GLUCAP 112* 130* 159* 91 113*   D-Dimer No results for input(s): DDIMER in the last 72 hours. Hgb A1c No results for input(s): HGBA1C in the last 72 hours. Lipid Profile No results for input(s): CHOL, HDL, LDLCALC, TRIG, CHOLHDL, LDLDIRECT in the last 72 hours. Thyroid function studies Recent Labs    04/06/19 0346  TSH 13.287*   Anemia work up No results for input(s): VITAMINB12, FOLATE, FERRITIN, TIBC, IRON, RETICCTPCT in the last 72 hours. Urinalysis    Component Value Date/Time   COLORURINE YELLOW 02/15/2019 1900   APPEARANCEUR HAZY (A) 02/15/2019 1900   LABSPEC >1.046 (H) 02/15/2019 1900   PHURINE 5.0 02/15/2019 1900   GLUCOSEU NEGATIVE 02/15/2019 1900   HGBUR NEGATIVE 02/15/2019 1900   BILIRUBINUR NEGATIVE 02/15/2019 1900   KETONESUR NEGATIVE 02/15/2019 1900   PROTEINUR 30 (A) 02/15/2019 1900   UROBILINOGEN 1.0 02/06/2015 1159   NITRITE NEGATIVE 02/15/2019 1900   LEUKOCYTESUR NEGATIVE 02/15/2019 1900   Sepsis Labs Invalid input(s): PROCALCITONIN,  WBC,  LACTICIDVEN Microbiology Recent Results (from the past 240 hour(s))  Wound or Superficial Culture     Status: Abnormal   Collection Time:  03/29/19 12:12 PM  Result Value Ref Range Status   Specimen Description WOUND LEFT LEG  Final   Special Requests NONE  Final   Gram Stain   Final    RARE WBC PRESENT, PREDOMINANTLY PMN ABUNDANT GRAM POSITIVE COCCI ABUNDANT GRAM NEGATIVE RODS ABUNDANT GRAM POSITIVE RODS Performed at [facility], [place] [address], [place], [place] [address]    Culture MULTIPLE ORGANISMS PRESENT, NONE PREDOMINANT (A)  Final   Report Status 03/31/2019 FINAL  Final     Time coordinating discharge: 35 minutes  SIGNED:   Antonieta Pert, MD  [facility] 04/08/2019, 9:19 AM  If 7PM-7AM, please contact night-coverage [email or web address]

## 2019-04-08 NOTE — Progress Notes (Signed)
Pt to  Be d/c to SNF. Report called to RN. All questions answered. D/C paperwork and prescriptions to go w/ pt via [facility].  Clyde Canterbury, RN

## 2019-04-09 DIAGNOSIS — Z86718 Personal history of other venous thrombosis and embolism: Secondary | ICD-10-CM | POA: Diagnosis not present

## 2019-04-09 DIAGNOSIS — I10 Essential (primary) hypertension: Secondary | ICD-10-CM | POA: Diagnosis not present

## 2019-04-09 DIAGNOSIS — F418 Other specified anxiety disorders: Secondary | ICD-10-CM | POA: Diagnosis not present

## 2019-04-09 DIAGNOSIS — S78112D Complete traumatic amputation at level between left hip and knee, subsequent encounter: Secondary | ICD-10-CM | POA: Diagnosis not present

## 2019-04-12 ENCOUNTER — Ambulatory Visit: Payer: Medicare Other | Admitting: Cardiovascular Disease

## 2019-04-12 DIAGNOSIS — Z86718 Personal history of other venous thrombosis and embolism: Secondary | ICD-10-CM | POA: Diagnosis not present

## 2019-04-12 DIAGNOSIS — S78112D Complete traumatic amputation at level between left hip and knee, subsequent encounter: Secondary | ICD-10-CM | POA: Diagnosis not present

## 2019-04-12 DIAGNOSIS — Z9119 Patient's noncompliance with other medical treatment and regimen: Secondary | ICD-10-CM | POA: Diagnosis not present

## 2019-04-13 DIAGNOSIS — Z86718 Personal history of other venous thrombosis and embolism: Secondary | ICD-10-CM | POA: Diagnosis not present

## 2019-04-13 DIAGNOSIS — I10 Essential (primary) hypertension: Secondary | ICD-10-CM | POA: Diagnosis not present

## 2019-04-13 DIAGNOSIS — F418 Other specified anxiety disorders: Secondary | ICD-10-CM | POA: Diagnosis not present

## 2019-04-13 DIAGNOSIS — S78112D Complete traumatic amputation at level between left hip and knee, subsequent encounter: Secondary | ICD-10-CM | POA: Diagnosis not present

## 2019-04-14 DIAGNOSIS — Z4781 Encounter for orthopedic aftercare following surgical amputation: Secondary | ICD-10-CM | POA: Diagnosis not present

## 2019-04-14 DIAGNOSIS — M6281 Muscle weakness (generalized): Secondary | ICD-10-CM | POA: Diagnosis not present

## 2019-04-14 DIAGNOSIS — I739 Peripheral vascular disease, unspecified: Secondary | ICD-10-CM | POA: Diagnosis not present

## 2019-04-14 DIAGNOSIS — R262 Difficulty in walking, not elsewhere classified: Secondary | ICD-10-CM | POA: Diagnosis not present

## 2019-04-15 ENCOUNTER — Telehealth: Payer: Self-pay | Admitting: Vascular Surgery

## 2019-04-15 NOTE — Telephone Encounter (Signed)
Alisha [name] from [facility] called yesterday 04/15.  States the pt keeps taking off her wound vac after her AKA, and debridement/infection.  F/u scheduled with Dr. Oneida Alar on 4/30.  Requesting d/c order of Wound Vac, and a new order for Collagen, Silver Alginate, and Anti-cept applied to wound daily.  Per Dr. Oneida Alar, ok to D/c Wound Vac and ok to order stated medicine to be applied to wound daily.  I spoke to another nurse Olin Hauser.  She will relay the message to [name]. who is only there on Wed.'s.      Thurston Hole., LPN

## 2019-04-16 DIAGNOSIS — Z86718 Personal history of other venous thrombosis and embolism: Secondary | ICD-10-CM | POA: Diagnosis not present

## 2019-04-16 DIAGNOSIS — I10 Essential (primary) hypertension: Secondary | ICD-10-CM | POA: Diagnosis not present

## 2019-04-16 DIAGNOSIS — F418 Other specified anxiety disorders: Secondary | ICD-10-CM | POA: Diagnosis not present

## 2019-04-16 DIAGNOSIS — S78112D Complete traumatic amputation at level between left hip and knee, subsequent encounter: Secondary | ICD-10-CM | POA: Diagnosis not present

## 2019-04-21 DIAGNOSIS — M6281 Muscle weakness (generalized): Secondary | ICD-10-CM | POA: Diagnosis not present

## 2019-04-21 DIAGNOSIS — R262 Difficulty in walking, not elsewhere classified: Secondary | ICD-10-CM | POA: Diagnosis not present

## 2019-04-21 DIAGNOSIS — S78112D Complete traumatic amputation at level between left hip and knee, subsequent encounter: Secondary | ICD-10-CM | POA: Diagnosis not present

## 2019-04-21 DIAGNOSIS — Z4781 Encounter for orthopedic aftercare following surgical amputation: Secondary | ICD-10-CM | POA: Diagnosis not present

## 2019-04-23 DIAGNOSIS — S78112D Complete traumatic amputation at level between left hip and knee, subsequent encounter: Secondary | ICD-10-CM | POA: Diagnosis not present

## 2019-04-23 DIAGNOSIS — Z86718 Personal history of other venous thrombosis and embolism: Secondary | ICD-10-CM | POA: Diagnosis not present

## 2019-04-23 DIAGNOSIS — I10 Essential (primary) hypertension: Secondary | ICD-10-CM | POA: Diagnosis not present

## 2019-04-23 DIAGNOSIS — F418 Other specified anxiety disorders: Secondary | ICD-10-CM | POA: Diagnosis not present

## 2019-04-28 DIAGNOSIS — Z4781 Encounter for orthopedic aftercare following surgical amputation: Secondary | ICD-10-CM | POA: Diagnosis not present

## 2019-04-28 DIAGNOSIS — M6281 Muscle weakness (generalized): Secondary | ICD-10-CM | POA: Diagnosis not present

## 2019-04-28 DIAGNOSIS — S78112D Complete traumatic amputation at level between left hip and knee, subsequent encounter: Secondary | ICD-10-CM | POA: Diagnosis not present

## 2019-04-28 DIAGNOSIS — R262 Difficulty in walking, not elsewhere classified: Secondary | ICD-10-CM | POA: Diagnosis not present

## 2019-04-29 ENCOUNTER — Encounter: Payer: Medicare Other | Admitting: Vascular Surgery

## 2019-04-29 ENCOUNTER — Other Ambulatory Visit: Payer: Self-pay

## 2019-05-05 DIAGNOSIS — S78112D Complete traumatic amputation at level between left hip and knee, subsequent encounter: Secondary | ICD-10-CM | POA: Diagnosis not present

## 2019-05-05 DIAGNOSIS — Z4781 Encounter for orthopedic aftercare following surgical amputation: Secondary | ICD-10-CM | POA: Diagnosis not present

## 2019-05-05 DIAGNOSIS — R262 Difficulty in walking, not elsewhere classified: Secondary | ICD-10-CM | POA: Diagnosis not present

## 2019-05-05 DIAGNOSIS — M6281 Muscle weakness (generalized): Secondary | ICD-10-CM | POA: Diagnosis not present

## 2019-05-07 DIAGNOSIS — S78112D Complete traumatic amputation at level between left hip and knee, subsequent encounter: Secondary | ICD-10-CM | POA: Diagnosis not present

## 2019-05-07 DIAGNOSIS — I10 Essential (primary) hypertension: Secondary | ICD-10-CM | POA: Diagnosis not present

## 2019-05-07 DIAGNOSIS — F418 Other specified anxiety disorders: Secondary | ICD-10-CM | POA: Diagnosis not present

## 2019-05-07 DIAGNOSIS — Z86718 Personal history of other venous thrombosis and embolism: Secondary | ICD-10-CM | POA: Diagnosis not present

## 2019-05-19 DIAGNOSIS — I110 Hypertensive heart disease with heart failure: Secondary | ICD-10-CM | POA: Diagnosis not present

## 2019-05-19 DIAGNOSIS — Z7901 Long term (current) use of anticoagulants: Secondary | ICD-10-CM | POA: Diagnosis not present

## 2019-05-19 DIAGNOSIS — R41841 Cognitive communication deficit: Secondary | ICD-10-CM | POA: Diagnosis not present

## 2019-05-19 DIAGNOSIS — T8744 Infection of amputation stump, left lower extremity: Secondary | ICD-10-CM | POA: Diagnosis not present

## 2019-05-19 DIAGNOSIS — G894 Chronic pain syndrome: Secondary | ICD-10-CM | POA: Diagnosis not present

## 2019-05-19 DIAGNOSIS — F17210 Nicotine dependence, cigarettes, uncomplicated: Secondary | ICD-10-CM | POA: Diagnosis not present

## 2019-05-19 DIAGNOSIS — J449 Chronic obstructive pulmonary disease, unspecified: Secondary | ICD-10-CM | POA: Diagnosis not present

## 2019-05-19 DIAGNOSIS — Z86718 Personal history of other venous thrombosis and embolism: Secondary | ICD-10-CM | POA: Diagnosis not present

## 2019-05-19 DIAGNOSIS — I70202 Unspecified atherosclerosis of native arteries of extremities, left leg: Secondary | ICD-10-CM | POA: Diagnosis not present

## 2019-05-19 DIAGNOSIS — F319 Bipolar disorder, unspecified: Secondary | ICD-10-CM | POA: Diagnosis not present

## 2019-05-19 DIAGNOSIS — M1711 Unilateral primary osteoarthritis, right knee: Secondary | ICD-10-CM | POA: Diagnosis not present

## 2019-05-19 DIAGNOSIS — Z89612 Acquired absence of left leg above knee: Secondary | ICD-10-CM | POA: Diagnosis not present

## 2019-05-19 DIAGNOSIS — T8754 Necrosis of amputation stump, left lower extremity: Secondary | ICD-10-CM | POA: Diagnosis not present

## 2019-05-19 DIAGNOSIS — I5042 Chronic combined systolic (congestive) and diastolic (congestive) heart failure: Secondary | ICD-10-CM | POA: Diagnosis not present

## 2019-05-19 DIAGNOSIS — Z4801 Encounter for change or removal of surgical wound dressing: Secondary | ICD-10-CM | POA: Diagnosis not present

## 2019-05-20 DIAGNOSIS — T8744 Infection of amputation stump, left lower extremity: Secondary | ICD-10-CM | POA: Diagnosis not present

## 2019-05-20 DIAGNOSIS — Z89612 Acquired absence of left leg above knee: Secondary | ICD-10-CM | POA: Diagnosis not present

## 2019-05-20 DIAGNOSIS — I70202 Unspecified atherosclerosis of native arteries of extremities, left leg: Secondary | ICD-10-CM | POA: Diagnosis not present

## 2019-05-20 DIAGNOSIS — T8754 Necrosis of amputation stump, left lower extremity: Secondary | ICD-10-CM | POA: Diagnosis not present

## 2019-05-20 DIAGNOSIS — Z4801 Encounter for change or removal of surgical wound dressing: Secondary | ICD-10-CM | POA: Diagnosis not present

## 2019-05-20 DIAGNOSIS — G894 Chronic pain syndrome: Secondary | ICD-10-CM | POA: Diagnosis not present

## 2019-05-21 DIAGNOSIS — B519 Plasmodium vivax malaria without complication: Secondary | ICD-10-CM | POA: Diagnosis not present

## 2019-05-21 DIAGNOSIS — Z89612 Acquired absence of left leg above knee: Secondary | ICD-10-CM | POA: Diagnosis not present

## 2019-05-21 DIAGNOSIS — G894 Chronic pain syndrome: Secondary | ICD-10-CM | POA: Diagnosis not present

## 2019-05-21 DIAGNOSIS — I5022 Chronic systolic (congestive) heart failure: Secondary | ICD-10-CM | POA: Diagnosis not present

## 2019-05-21 DIAGNOSIS — I10 Essential (primary) hypertension: Secondary | ICD-10-CM | POA: Diagnosis not present

## 2019-05-21 DIAGNOSIS — T8754 Necrosis of amputation stump, left lower extremity: Secondary | ICD-10-CM | POA: Diagnosis not present

## 2019-05-21 DIAGNOSIS — F319 Bipolar disorder, unspecified: Secondary | ICD-10-CM | POA: Diagnosis not present

## 2019-05-21 DIAGNOSIS — J4520 Mild intermittent asthma, uncomplicated: Secondary | ICD-10-CM | POA: Diagnosis not present

## 2019-05-21 DIAGNOSIS — I70202 Unspecified atherosclerosis of native arteries of extremities, left leg: Secondary | ICD-10-CM | POA: Diagnosis not present

## 2019-05-21 DIAGNOSIS — Z4801 Encounter for change or removal of surgical wound dressing: Secondary | ICD-10-CM | POA: Diagnosis not present

## 2019-05-21 DIAGNOSIS — T8744 Infection of amputation stump, left lower extremity: Secondary | ICD-10-CM | POA: Diagnosis not present

## 2019-05-25 DIAGNOSIS — G894 Chronic pain syndrome: Secondary | ICD-10-CM | POA: Diagnosis not present

## 2019-05-25 DIAGNOSIS — Z4801 Encounter for change or removal of surgical wound dressing: Secondary | ICD-10-CM | POA: Diagnosis not present

## 2019-05-25 DIAGNOSIS — T8754 Necrosis of amputation stump, left lower extremity: Secondary | ICD-10-CM | POA: Diagnosis not present

## 2019-05-25 DIAGNOSIS — I70202 Unspecified atherosclerosis of native arteries of extremities, left leg: Secondary | ICD-10-CM | POA: Diagnosis not present

## 2019-05-25 DIAGNOSIS — T8744 Infection of amputation stump, left lower extremity: Secondary | ICD-10-CM | POA: Diagnosis not present

## 2019-05-25 DIAGNOSIS — Z89612 Acquired absence of left leg above knee: Secondary | ICD-10-CM | POA: Diagnosis not present

## 2019-05-26 DIAGNOSIS — Z89612 Acquired absence of left leg above knee: Secondary | ICD-10-CM | POA: Diagnosis not present

## 2019-05-26 DIAGNOSIS — T8754 Necrosis of amputation stump, left lower extremity: Secondary | ICD-10-CM | POA: Diagnosis not present

## 2019-05-26 DIAGNOSIS — Z4801 Encounter for change or removal of surgical wound dressing: Secondary | ICD-10-CM | POA: Diagnosis not present

## 2019-05-26 DIAGNOSIS — I70202 Unspecified atherosclerosis of native arteries of extremities, left leg: Secondary | ICD-10-CM | POA: Diagnosis not present

## 2019-05-26 DIAGNOSIS — T8744 Infection of amputation stump, left lower extremity: Secondary | ICD-10-CM | POA: Diagnosis not present

## 2019-05-26 DIAGNOSIS — G894 Chronic pain syndrome: Secondary | ICD-10-CM | POA: Diagnosis not present

## 2019-05-28 ENCOUNTER — Telehealth: Payer: Self-pay

## 2019-05-28 DIAGNOSIS — T8744 Infection of amputation stump, left lower extremity: Secondary | ICD-10-CM | POA: Diagnosis not present

## 2019-05-28 DIAGNOSIS — Z4801 Encounter for change or removal of surgical wound dressing: Secondary | ICD-10-CM | POA: Diagnosis not present

## 2019-05-28 DIAGNOSIS — G894 Chronic pain syndrome: Secondary | ICD-10-CM | POA: Diagnosis not present

## 2019-05-28 DIAGNOSIS — T8754 Necrosis of amputation stump, left lower extremity: Secondary | ICD-10-CM | POA: Diagnosis not present

## 2019-05-28 DIAGNOSIS — Z89612 Acquired absence of left leg above knee: Secondary | ICD-10-CM | POA: Diagnosis not present

## 2019-05-28 DIAGNOSIS — I70202 Unspecified atherosclerosis of native arteries of extremities, left leg: Secondary | ICD-10-CM | POA: Diagnosis not present

## 2019-05-28 NOTE — Telephone Encounter (Signed)
Moe with [facility] called and said that patient is still complaining of pain 10 out of 10 and burning at her incision area and would like to speak with someone.   Called her back and told her that she no showed her post op appt and I do not see that she has rescheduled a follow up. Advised that this would be the best place to start so she could be evaluated. Called patient and left message for her to call office.   Called Moe back and advised her that I had tried to contact patient and got her VM. Asked her to have the pt call when she sees her to get her in for a visit.  York Cerise, CMA

## 2019-06-01 DIAGNOSIS — T8744 Infection of amputation stump, left lower extremity: Secondary | ICD-10-CM | POA: Diagnosis not present

## 2019-06-01 DIAGNOSIS — T8754 Necrosis of amputation stump, left lower extremity: Secondary | ICD-10-CM | POA: Diagnosis not present

## 2019-06-01 DIAGNOSIS — G894 Chronic pain syndrome: Secondary | ICD-10-CM | POA: Diagnosis not present

## 2019-06-01 DIAGNOSIS — I70202 Unspecified atherosclerosis of native arteries of extremities, left leg: Secondary | ICD-10-CM | POA: Diagnosis not present

## 2019-06-01 DIAGNOSIS — Z4801 Encounter for change or removal of surgical wound dressing: Secondary | ICD-10-CM | POA: Diagnosis not present

## 2019-06-01 DIAGNOSIS — Z89612 Acquired absence of left leg above knee: Secondary | ICD-10-CM | POA: Diagnosis not present

## 2019-06-02 DIAGNOSIS — I70202 Unspecified atherosclerosis of native arteries of extremities, left leg: Secondary | ICD-10-CM | POA: Diagnosis not present

## 2019-06-02 DIAGNOSIS — T8744 Infection of amputation stump, left lower extremity: Secondary | ICD-10-CM | POA: Diagnosis not present

## 2019-06-02 DIAGNOSIS — Z89612 Acquired absence of left leg above knee: Secondary | ICD-10-CM | POA: Diagnosis not present

## 2019-06-02 DIAGNOSIS — T8754 Necrosis of amputation stump, left lower extremity: Secondary | ICD-10-CM | POA: Diagnosis not present

## 2019-06-02 DIAGNOSIS — Z4801 Encounter for change or removal of surgical wound dressing: Secondary | ICD-10-CM | POA: Diagnosis not present

## 2019-06-02 DIAGNOSIS — G894 Chronic pain syndrome: Secondary | ICD-10-CM | POA: Diagnosis not present

## 2019-06-04 DIAGNOSIS — T8754 Necrosis of amputation stump, left lower extremity: Secondary | ICD-10-CM | POA: Diagnosis not present

## 2019-06-04 DIAGNOSIS — T8744 Infection of amputation stump, left lower extremity: Secondary | ICD-10-CM | POA: Diagnosis not present

## 2019-06-04 DIAGNOSIS — Z89612 Acquired absence of left leg above knee: Secondary | ICD-10-CM | POA: Diagnosis not present

## 2019-06-04 DIAGNOSIS — G894 Chronic pain syndrome: Secondary | ICD-10-CM | POA: Diagnosis not present

## 2019-06-04 DIAGNOSIS — Z4801 Encounter for change or removal of surgical wound dressing: Secondary | ICD-10-CM | POA: Diagnosis not present

## 2019-06-04 DIAGNOSIS — I70202 Unspecified atherosclerosis of native arteries of extremities, left leg: Secondary | ICD-10-CM | POA: Diagnosis not present

## 2019-06-07 DIAGNOSIS — Z4801 Encounter for change or removal of surgical wound dressing: Secondary | ICD-10-CM | POA: Diagnosis not present

## 2019-06-07 DIAGNOSIS — T8754 Necrosis of amputation stump, left lower extremity: Secondary | ICD-10-CM | POA: Diagnosis not present

## 2019-06-07 DIAGNOSIS — T8744 Infection of amputation stump, left lower extremity: Secondary | ICD-10-CM | POA: Diagnosis not present

## 2019-06-07 DIAGNOSIS — Z89612 Acquired absence of left leg above knee: Secondary | ICD-10-CM | POA: Diagnosis not present

## 2019-06-07 DIAGNOSIS — I70202 Unspecified atherosclerosis of native arteries of extremities, left leg: Secondary | ICD-10-CM | POA: Diagnosis not present

## 2019-06-07 DIAGNOSIS — G894 Chronic pain syndrome: Secondary | ICD-10-CM | POA: Diagnosis not present

## 2019-06-08 DIAGNOSIS — T8744 Infection of amputation stump, left lower extremity: Secondary | ICD-10-CM | POA: Diagnosis not present

## 2019-06-08 DIAGNOSIS — T8754 Necrosis of amputation stump, left lower extremity: Secondary | ICD-10-CM | POA: Diagnosis not present

## 2019-06-08 DIAGNOSIS — Z89612 Acquired absence of left leg above knee: Secondary | ICD-10-CM | POA: Diagnosis not present

## 2019-06-08 DIAGNOSIS — G894 Chronic pain syndrome: Secondary | ICD-10-CM | POA: Diagnosis not present

## 2019-06-08 DIAGNOSIS — I70202 Unspecified atherosclerosis of native arteries of extremities, left leg: Secondary | ICD-10-CM | POA: Diagnosis not present

## 2019-06-08 DIAGNOSIS — Z4801 Encounter for change or removal of surgical wound dressing: Secondary | ICD-10-CM | POA: Diagnosis not present

## 2019-06-10 DIAGNOSIS — Z89612 Acquired absence of left leg above knee: Secondary | ICD-10-CM | POA: Diagnosis not present

## 2019-06-10 DIAGNOSIS — G894 Chronic pain syndrome: Secondary | ICD-10-CM | POA: Diagnosis not present

## 2019-06-10 DIAGNOSIS — T8754 Necrosis of amputation stump, left lower extremity: Secondary | ICD-10-CM | POA: Diagnosis not present

## 2019-06-10 DIAGNOSIS — I70202 Unspecified atherosclerosis of native arteries of extremities, left leg: Secondary | ICD-10-CM | POA: Diagnosis not present

## 2019-06-10 DIAGNOSIS — Z4801 Encounter for change or removal of surgical wound dressing: Secondary | ICD-10-CM | POA: Diagnosis not present

## 2019-06-10 DIAGNOSIS — T8744 Infection of amputation stump, left lower extremity: Secondary | ICD-10-CM | POA: Diagnosis not present

## 2019-06-14 DIAGNOSIS — I70202 Unspecified atherosclerosis of native arteries of extremities, left leg: Secondary | ICD-10-CM | POA: Diagnosis not present

## 2019-06-14 DIAGNOSIS — T8744 Infection of amputation stump, left lower extremity: Secondary | ICD-10-CM | POA: Diagnosis not present

## 2019-06-14 DIAGNOSIS — Z4801 Encounter for change or removal of surgical wound dressing: Secondary | ICD-10-CM | POA: Diagnosis not present

## 2019-06-14 DIAGNOSIS — G894 Chronic pain syndrome: Secondary | ICD-10-CM | POA: Diagnosis not present

## 2019-06-14 DIAGNOSIS — Z89612 Acquired absence of left leg above knee: Secondary | ICD-10-CM | POA: Diagnosis not present

## 2019-06-14 DIAGNOSIS — T8754 Necrosis of amputation stump, left lower extremity: Secondary | ICD-10-CM | POA: Diagnosis not present

## 2019-06-15 DIAGNOSIS — Z89612 Acquired absence of left leg above knee: Secondary | ICD-10-CM | POA: Diagnosis not present

## 2019-06-15 DIAGNOSIS — Z4801 Encounter for change or removal of surgical wound dressing: Secondary | ICD-10-CM | POA: Diagnosis not present

## 2019-06-15 DIAGNOSIS — T8744 Infection of amputation stump, left lower extremity: Secondary | ICD-10-CM | POA: Diagnosis not present

## 2019-06-15 DIAGNOSIS — T8754 Necrosis of amputation stump, left lower extremity: Secondary | ICD-10-CM | POA: Diagnosis not present

## 2019-06-15 DIAGNOSIS — G894 Chronic pain syndrome: Secondary | ICD-10-CM | POA: Diagnosis not present

## 2019-06-15 DIAGNOSIS — I70202 Unspecified atherosclerosis of native arteries of extremities, left leg: Secondary | ICD-10-CM | POA: Diagnosis not present

## 2019-06-17 DIAGNOSIS — T8744 Infection of amputation stump, left lower extremity: Secondary | ICD-10-CM | POA: Diagnosis not present

## 2019-06-17 DIAGNOSIS — Z4801 Encounter for change or removal of surgical wound dressing: Secondary | ICD-10-CM | POA: Diagnosis not present

## 2019-06-17 DIAGNOSIS — G894 Chronic pain syndrome: Secondary | ICD-10-CM | POA: Diagnosis not present

## 2019-06-17 DIAGNOSIS — I70202 Unspecified atherosclerosis of native arteries of extremities, left leg: Secondary | ICD-10-CM | POA: Diagnosis not present

## 2019-06-17 DIAGNOSIS — T8754 Necrosis of amputation stump, left lower extremity: Secondary | ICD-10-CM | POA: Diagnosis not present

## 2019-06-17 DIAGNOSIS — Z89612 Acquired absence of left leg above knee: Secondary | ICD-10-CM | POA: Diagnosis not present

## 2019-06-18 DIAGNOSIS — J449 Chronic obstructive pulmonary disease, unspecified: Secondary | ICD-10-CM | POA: Diagnosis not present

## 2019-06-18 DIAGNOSIS — T8744 Infection of amputation stump, left lower extremity: Secondary | ICD-10-CM | POA: Diagnosis not present

## 2019-06-18 DIAGNOSIS — Z89612 Acquired absence of left leg above knee: Secondary | ICD-10-CM | POA: Diagnosis not present

## 2019-06-18 DIAGNOSIS — F319 Bipolar disorder, unspecified: Secondary | ICD-10-CM | POA: Diagnosis not present

## 2019-06-18 DIAGNOSIS — T8754 Necrosis of amputation stump, left lower extremity: Secondary | ICD-10-CM | POA: Diagnosis not present

## 2019-06-18 DIAGNOSIS — R41841 Cognitive communication deficit: Secondary | ICD-10-CM | POA: Diagnosis not present

## 2019-06-18 DIAGNOSIS — I110 Hypertensive heart disease with heart failure: Secondary | ICD-10-CM | POA: Diagnosis not present

## 2019-06-18 DIAGNOSIS — F17210 Nicotine dependence, cigarettes, uncomplicated: Secondary | ICD-10-CM | POA: Diagnosis not present

## 2019-06-18 DIAGNOSIS — M1711 Unilateral primary osteoarthritis, right knee: Secondary | ICD-10-CM | POA: Diagnosis not present

## 2019-06-18 DIAGNOSIS — I5042 Chronic combined systolic (congestive) and diastolic (congestive) heart failure: Secondary | ICD-10-CM | POA: Diagnosis not present

## 2019-06-18 DIAGNOSIS — Z86718 Personal history of other venous thrombosis and embolism: Secondary | ICD-10-CM | POA: Diagnosis not present

## 2019-06-18 DIAGNOSIS — I70202 Unspecified atherosclerosis of native arteries of extremities, left leg: Secondary | ICD-10-CM | POA: Diagnosis not present

## 2019-06-18 DIAGNOSIS — G894 Chronic pain syndrome: Secondary | ICD-10-CM | POA: Diagnosis not present

## 2019-06-18 DIAGNOSIS — Z7901 Long term (current) use of anticoagulants: Secondary | ICD-10-CM | POA: Diagnosis not present

## 2019-06-18 DIAGNOSIS — Z4801 Encounter for change or removal of surgical wound dressing: Secondary | ICD-10-CM | POA: Diagnosis not present

## 2019-06-22 DIAGNOSIS — T8744 Infection of amputation stump, left lower extremity: Secondary | ICD-10-CM | POA: Diagnosis not present

## 2019-06-22 DIAGNOSIS — Z4801 Encounter for change or removal of surgical wound dressing: Secondary | ICD-10-CM | POA: Diagnosis not present

## 2019-06-22 DIAGNOSIS — I70202 Unspecified atherosclerosis of native arteries of extremities, left leg: Secondary | ICD-10-CM | POA: Diagnosis not present

## 2019-06-22 DIAGNOSIS — T8754 Necrosis of amputation stump, left lower extremity: Secondary | ICD-10-CM | POA: Diagnosis not present

## 2019-06-22 DIAGNOSIS — G894 Chronic pain syndrome: Secondary | ICD-10-CM | POA: Diagnosis not present

## 2019-06-22 DIAGNOSIS — Z89612 Acquired absence of left leg above knee: Secondary | ICD-10-CM | POA: Diagnosis not present

## 2019-06-28 DIAGNOSIS — Z4801 Encounter for change or removal of surgical wound dressing: Secondary | ICD-10-CM | POA: Diagnosis not present

## 2019-06-28 DIAGNOSIS — I70202 Unspecified atherosclerosis of native arteries of extremities, left leg: Secondary | ICD-10-CM | POA: Diagnosis not present

## 2019-06-28 DIAGNOSIS — G894 Chronic pain syndrome: Secondary | ICD-10-CM | POA: Diagnosis not present

## 2019-06-28 DIAGNOSIS — Z89612 Acquired absence of left leg above knee: Secondary | ICD-10-CM | POA: Diagnosis not present

## 2019-06-28 DIAGNOSIS — T8744 Infection of amputation stump, left lower extremity: Secondary | ICD-10-CM | POA: Diagnosis not present

## 2019-06-28 DIAGNOSIS — T8754 Necrosis of amputation stump, left lower extremity: Secondary | ICD-10-CM | POA: Diagnosis not present

## 2019-07-02 DIAGNOSIS — F17210 Nicotine dependence, cigarettes, uncomplicated: Secondary | ICD-10-CM | POA: Diagnosis not present

## 2019-07-02 DIAGNOSIS — J4520 Mild intermittent asthma, uncomplicated: Secondary | ICD-10-CM | POA: Diagnosis not present

## 2019-07-02 DIAGNOSIS — I5022 Chronic systolic (congestive) heart failure: Secondary | ICD-10-CM | POA: Diagnosis not present

## 2019-07-02 DIAGNOSIS — I10 Essential (primary) hypertension: Secondary | ICD-10-CM | POA: Diagnosis not present

## 2019-07-02 DIAGNOSIS — F319 Bipolar disorder, unspecified: Secondary | ICD-10-CM | POA: Diagnosis not present

## 2019-07-02 DIAGNOSIS — I7389 Other specified peripheral vascular diseases: Secondary | ICD-10-CM | POA: Diagnosis not present

## 2019-07-07 DIAGNOSIS — T8754 Necrosis of amputation stump, left lower extremity: Secondary | ICD-10-CM | POA: Diagnosis not present

## 2019-07-07 DIAGNOSIS — Z89612 Acquired absence of left leg above knee: Secondary | ICD-10-CM | POA: Diagnosis not present

## 2019-07-07 DIAGNOSIS — I70202 Unspecified atherosclerosis of native arteries of extremities, left leg: Secondary | ICD-10-CM | POA: Diagnosis not present

## 2019-07-07 DIAGNOSIS — Z4801 Encounter for change or removal of surgical wound dressing: Secondary | ICD-10-CM | POA: Diagnosis not present

## 2019-07-07 DIAGNOSIS — T8744 Infection of amputation stump, left lower extremity: Secondary | ICD-10-CM | POA: Diagnosis not present

## 2019-07-07 DIAGNOSIS — G894 Chronic pain syndrome: Secondary | ICD-10-CM | POA: Diagnosis not present

## 2019-07-14 ENCOUNTER — Telehealth: Payer: Self-pay

## 2019-07-14 DIAGNOSIS — I70202 Unspecified atherosclerosis of native arteries of extremities, left leg: Secondary | ICD-10-CM | POA: Diagnosis not present

## 2019-07-14 DIAGNOSIS — T8754 Necrosis of amputation stump, left lower extremity: Secondary | ICD-10-CM | POA: Diagnosis not present

## 2019-07-14 DIAGNOSIS — Z4801 Encounter for change or removal of surgical wound dressing: Secondary | ICD-10-CM | POA: Diagnosis not present

## 2019-07-14 DIAGNOSIS — Z89612 Acquired absence of left leg above knee: Secondary | ICD-10-CM | POA: Diagnosis not present

## 2019-07-14 DIAGNOSIS — G894 Chronic pain syndrome: Secondary | ICD-10-CM | POA: Diagnosis not present

## 2019-07-14 DIAGNOSIS — T8744 Infection of amputation stump, left lower extremity: Secondary | ICD-10-CM | POA: Diagnosis not present

## 2019-07-14 NOTE — Telephone Encounter (Signed)
Moe with [facility] called and said that she would like to get an appt for pt to be seen. Said that her wound needs an assessment as it is warm to the touch and pt is complaining of pain.   Appt made for pt to be seen next week.   York Cerise, CMA

## 2019-07-16 DIAGNOSIS — Z0000 Encounter for general adult medical examination without abnormal findings: Secondary | ICD-10-CM | POA: Diagnosis not present

## 2019-07-16 DIAGNOSIS — Z716 Tobacco abuse counseling: Secondary | ICD-10-CM | POA: Diagnosis not present

## 2019-07-16 DIAGNOSIS — E559 Vitamin D deficiency, unspecified: Secondary | ICD-10-CM | POA: Diagnosis not present

## 2019-07-16 DIAGNOSIS — I10 Essential (primary) hypertension: Secondary | ICD-10-CM | POA: Diagnosis not present

## 2019-07-16 DIAGNOSIS — I5022 Chronic systolic (congestive) heart failure: Secondary | ICD-10-CM | POA: Diagnosis not present

## 2019-07-16 DIAGNOSIS — B519 Plasmodium vivax malaria without complication: Secondary | ICD-10-CM | POA: Diagnosis not present

## 2019-07-16 DIAGNOSIS — E7849 Other hyperlipidemia: Secondary | ICD-10-CM | POA: Diagnosis not present

## 2019-07-16 DIAGNOSIS — Z131 Encounter for screening for diabetes mellitus: Secondary | ICD-10-CM | POA: Diagnosis not present

## 2019-07-16 DIAGNOSIS — F17210 Nicotine dependence, cigarettes, uncomplicated: Secondary | ICD-10-CM | POA: Diagnosis not present

## 2019-07-18 DIAGNOSIS — M1711 Unilateral primary osteoarthritis, right knee: Secondary | ICD-10-CM | POA: Diagnosis not present

## 2019-07-18 DIAGNOSIS — Z4801 Encounter for change or removal of surgical wound dressing: Secondary | ICD-10-CM | POA: Diagnosis not present

## 2019-07-18 DIAGNOSIS — T8744 Infection of amputation stump, left lower extremity: Secondary | ICD-10-CM | POA: Diagnosis not present

## 2019-07-18 DIAGNOSIS — J449 Chronic obstructive pulmonary disease, unspecified: Secondary | ICD-10-CM | POA: Diagnosis not present

## 2019-07-18 DIAGNOSIS — G894 Chronic pain syndrome: Secondary | ICD-10-CM | POA: Diagnosis not present

## 2019-07-18 DIAGNOSIS — F17210 Nicotine dependence, cigarettes, uncomplicated: Secondary | ICD-10-CM | POA: Diagnosis not present

## 2019-07-18 DIAGNOSIS — R41841 Cognitive communication deficit: Secondary | ICD-10-CM | POA: Diagnosis not present

## 2019-07-18 DIAGNOSIS — Z86718 Personal history of other venous thrombosis and embolism: Secondary | ICD-10-CM | POA: Diagnosis not present

## 2019-07-18 DIAGNOSIS — F319 Bipolar disorder, unspecified: Secondary | ICD-10-CM | POA: Diagnosis not present

## 2019-07-18 DIAGNOSIS — I70202 Unspecified atherosclerosis of native arteries of extremities, left leg: Secondary | ICD-10-CM | POA: Diagnosis not present

## 2019-07-18 DIAGNOSIS — I5042 Chronic combined systolic (congestive) and diastolic (congestive) heart failure: Secondary | ICD-10-CM | POA: Diagnosis not present

## 2019-07-18 DIAGNOSIS — Z7901 Long term (current) use of anticoagulants: Secondary | ICD-10-CM | POA: Diagnosis not present

## 2019-07-18 DIAGNOSIS — Z89612 Acquired absence of left leg above knee: Secondary | ICD-10-CM | POA: Diagnosis not present

## 2019-07-18 DIAGNOSIS — I110 Hypertensive heart disease with heart failure: Secondary | ICD-10-CM | POA: Diagnosis not present

## 2019-07-18 DIAGNOSIS — T8754 Necrosis of amputation stump, left lower extremity: Secondary | ICD-10-CM | POA: Diagnosis not present

## 2019-07-19 DIAGNOSIS — T8744 Infection of amputation stump, left lower extremity: Secondary | ICD-10-CM | POA: Diagnosis not present

## 2019-07-19 DIAGNOSIS — G894 Chronic pain syndrome: Secondary | ICD-10-CM | POA: Diagnosis not present

## 2019-07-19 DIAGNOSIS — T8754 Necrosis of amputation stump, left lower extremity: Secondary | ICD-10-CM | POA: Diagnosis not present

## 2019-07-19 DIAGNOSIS — Z89612 Acquired absence of left leg above knee: Secondary | ICD-10-CM | POA: Diagnosis not present

## 2019-07-19 DIAGNOSIS — I70202 Unspecified atherosclerosis of native arteries of extremities, left leg: Secondary | ICD-10-CM | POA: Diagnosis not present

## 2019-07-19 DIAGNOSIS — Z4801 Encounter for change or removal of surgical wound dressing: Secondary | ICD-10-CM | POA: Diagnosis not present

## 2019-07-20 ENCOUNTER — Ambulatory Visit: Payer: Medicare Other

## 2019-07-20 NOTE — Progress Notes (Deleted)
HISTORY AND PHYSICAL     CC:  follow up Requesting Provider:  Nolene Ebbs, MD  HPI: This is a 58 y.o. 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 Scot Dock.  She did not show for her post operative appointment.  [facility] was consulted on 03/29/2019 for foul smelling drainage and pain in her stump.  At that time, she was still smoking.   She was taken to the OR on 03/29/2019 by Dr. Oneida Alar and underwent debridement of the left AKA.  She was found to have necrotic fat and sq tissue.   On 04/02/2019, she was taken back to the OR and underwent excisional debridement of left AKA (skin, sq tissue and fascia) and placement of wound vac.   She was last seen by [facility] on 04/08/2019 and at that time, she was for wound vac change the next day.  She was discharged that day.  HH called our office on 4/16 stating that the pt kept taking the wound vac off and requested order for collagen, silver alginate and anti-cept to apply to wound daily.   Another call was received on 05/28/2019 by [facility] and pt still c/o pain and burning at the incision.  Pt was called by CMA and pt did not answer and VM was left.  Again, received called for [facility] that pt would like to be seen as her wound needs assessment as it is warm to touch and pt continues to c/o pain.    The pt is here for follow up.  ***  The pt is on a statin for cholesterol management.  The pt is on a daily aspirin.   Other AC:  Xarelto  The pt is on BB, ARB for hypertension.   The pt is not diabetic.   Tobacco hx:  Current ***   Past Medical History:  Diagnosis Date  . Anxiety   . Asthma   . Bipolar disorder ([employer])   . Breast lump 02/2008   Biopsy 05/2008: showed no evidence of malignancy  . Breast mass in female    bi lat   . CHF (congestive heart failure) ([place])   . Chronic pain syndrome   . Degenerative joint disease of knee   . Dental caries   . Depression   . 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  Endometrial mass 10/12/2012   Endometrium, biopsy on 11/04/12 - PROLIFERATIVE ENDOMETRIUM AND ABUNDANT MUCUS. NO HYPERPLASIA OR CARCINOMA.   . Fibromyalgia   . Hyperlipidemia   . Hypertension   . Irregular menses 9/08  . Joint pain   . Mixed stress and urge urinary incontinence    Being followed by [facility], underwent cystoscopy & uroflowmetry   . Pulmonary edema   . PVD (peripheral vascular disease) ([place])    s/p L fem-pop bypass 11/21/08; graft occluded 12/28/08;  aortogram w/ bilat LE runoff: 1.  bilat diffuse SFA occlusive dz, 2.  Mod to severe above-knee popliteal dz,  3.  Bilat 3-vessel runoff w/ mild tibial occlusive dz.  . Restless leg syndrome   . Sigmoid diverticulitis 10/26/2012  . Tobacco abuse   . Wears glasses     Past Surgical History:  Procedure Laterality Date  .  Right external iliac artery stent  11/04/2008  . ABDOMINAL AORTAGRAM     11/04/2008; 10/14/2006  . AMPUTATION Left 02/16/2019   Procedure: AMPUTATION ABOVE KNEE;  Surgeon: Angelia Mould, MD;  Location: [place];  Service: Vascular;  Laterality: Left;  . AMPUTATION Left 04/02/2019   Procedure: IRRIGATION AND DEBRIDEMENT LEFT ABOVE KNEE AMPUTATION;  Surgeon: Angelia Mould, MD;  Location: [place];  Service: Vascular;  Laterality: Left;  . APPLICATION OF WOUND VAC Left 04/02/2019   Procedure: APPLICATION OF WOUND VAC TO LEFT KNEE AMPUTATION;  Surgeon: Angelia Mould, MD;  Location: [place];  Service: Vascular;  Laterality: Left;  . BREAST LUMPECTOMY W/ NEEDLE LOCALIZATION Right 06/13/2008  . BREAST LUMPECTOMY WITH RADIOACTIVE SEED LOCALIZATION Bilateral 05/03/2014   Procedure: BREAST LUMPECTOMY WITH RADIOACTIVE SEED LOCALIZATION;  Surgeon: Joyice Faster. Cornett, MD;  Location: [place];  Service: General;  Laterality: Bilateral;  . COLONOSCOPY WITH PROPOFOL  01/25/2014  . FEMORAL ENDARTERECTOMY Left 11/21/2008  . FEMORAL-POPLITEAL BYPASS GRAFT Left 11/21/2008  . I&D EXTREMITY  Left 03/29/2019   Procedure: IRRIGATION AND DEBRIDEMENT LEFT ABOVE KNEE AMPUTATION;  Surgeon: Elam Dutch, MD;  Location: [place];  Service: Vascular;  Laterality: Left;  . LEFT HEART CATHETERIZATION WITH CORONARY ANGIOGRAM N/A 02/25/2014   Procedure: LEFT HEART CATHETERIZATION WITH CORONARY ANGIOGRAM;  Surgeon: Peter M Martinique, MD;  Location: [facility] C[facility];  Service: Cardiovascular;  Laterality: N/A;  . TONSILLECTOMY    . TUBAL LIGATION      Allergies  Allergen Reactions  . Propoxyphene N-Acetaminophen Anaphylaxis and Swelling  . Strawberry Extract Anaphylaxis and Shortness Of Breath    "I go into shock"  . Buspar [Buspirone] Swelling    "Swells my face"  . Grapefruit Extract Other (See Comments)    Interaction with medications she's taking  . Tape Itching    Patient says that it itches and swells with some adhesive dressings.    Current Outpatient Medications  Medication Sig Dispense Refill  . albuterol (VENTOLIN HFA) 108 (90 Base) MCG/ACT inhaler Inhale 1-2 puffs into the lungs every 6 (six) hours as needed for wheezing or shortness of breath. 1 Inhaler 2  . aspirin EC 81 MG tablet Take 81 mg by mouth daily.    Marland Kitchen atorvastatin (LIPITOR) 40 MG tablet Take 1 tablet (40 mg total) by mouth at bedtime. (Patient taking differently: Take 40 mg by mouth daily. ) 90 tablet 3  . carvedilol (COREG) 12.5 MG tablet Take 0.5 tablets (6.25 mg total) by mouth 2 (two) times daily with a meal. 180 tablet 1  . diazepam (VALIUM) 2 MG tablet Take 1 tablet (2 mg total) by mouth every 12 (twelve) hours as needed for up to 4 doses for anxiety. 4 tablet 0  . DULoxetine (CYMBALTA) 60 MG capsule Take 60 mg by mouth 2 (two) times daily.     Marland Kitchen escitalopram (LEXAPRO) 10 MG tablet Take 10 mg by mouth daily.    Marland Kitchen gabapentin (NEURONTIN) 800 MG tablet Take 800-1,600 mg by mouth See admin instructions. 800 mg twice daily, and 1600 mg at bedtime    . hydrochlorothiazide (HYDRODIURIL) 25 MG tablet Take 0.5 tablets (12.5  mg total) by mouth daily. 90 tablet 1  . HYDROcodone-acetaminophen (NORCO/VICODIN) 5-325 MG tablet Take 1 tablet by mouth every 6 (six) hours as needed for up to 6 doses for severe pain. 6 tablet 0  . hydrOXYzine (ATARAX/VISTARIL) 50 MG tablet Take 50 mg by mouth See admin instructions. Take 50 mg by mouth at bedtime and an additional 50 mg two times a day as needed for anxiety    . losartan (COZAAR) 25 MG tablet Take 1 tablet (25 mg total) by mouth daily. 90 tablet 3  .  Lurasidone HCl (LATUDA) 120 MG TABS Take 80 mg by mouth 2 (two) times daily.     . Multiple Vitamins-Minerals (MULTIVITAMIN WITH MINERALS) tablet Take 1 tablet by mouth daily.    . nicotine (NICODERM CQ - DOSED IN MG/24 HR) 7 mg/24hr patch Place 1 patch (7 mg total) onto the skin daily as needed (nicotine replacement). 28 patch 0  . polyethylene glycol (MIRALAX / GLYCOLAX) 17 g packet Take 17 g by mouth daily as needed for mild constipation. 14 each 0  . vitamin E (VITAMIN E) 400 UNIT capsule Take 800 Units by mouth 2 (two) times daily.    Alveda Reasons 10 MG TABS tablet Take 10 mg by mouth daily.  5   No current facility-administered medications for this visit.     Family History  Problem Relation Age of Onset  . Breast cancer Mother   . Depression Mother   . Hyperlipidemia Mother   . Mental retardation Mother   . Hyperlipidemia Father   . Mental retardation Father   . Breast cancer Other        GREAT AUNT  . Depression Sister   . Hyperlipidemia Sister   . Mental retardation Sister   . Mental retardation Daughter     Social History   Socioeconomic History  . Marital status: Divorced    Spouse name: Not on file  . Number of children: 4  . Years of education: Not on file  . Highest education level: Not on file  Occupational History  . Occupation: disable    Employer: UNEMPLOYED  Social Needs  . Financial resource strain: Not on file  . Food insecurity    Worry: Not on file    Inability: Not on file  .  Transportation needs    Medical: Not on file    Non-medical: Not on file  Tobacco Use  . Smoking status: Current Every Day Smoker    Packs/day: 0.50    Years: 18.00    Pack years: 9.00    Types: Cigarettes  . Smokeless tobacco: Never Used  . Tobacco comment: 1pk 1/2 PACK DAY 2-3 0r none  Substance and Sexual Activity  . Alcohol use: Not Currently    Alcohol/week: 0.0 standard drinks  . Drug use: No    Comment: hx of marijuana and cocaine use; uses Suboxone patch  . Sexual activity: Never  Lifestyle  . Physical activity    Days per week: Not on file    Minutes per session: Not on file  . Stress: Not on file  Relationships  . Social Herbalist on phone: Not on file    Gets together: Not on file    Attends religious service: Not on file    Active member of club or organization: Not on file    Attends meetings of clubs or organizations: Not on file    Relationship status: Not on file  . Intimate partner violence    Fear of current or ex partner: Not on file    Emotionally abused: Not on file    Physically abused: Not on file    Forced sexual activity: Not on file  Other Topics Concern  . Not on file  Social History Narrative   Financial assistance application initiated. Patient needs to submit further paperwork to complete   [facility]  September 11, 2010 2:04 PM   Financial assistance approved for 100% discount at [facility] and has [facility] card   [employer]  October 04, 2010 5:29 PM     REVIEW OF SYSTEMS:  *** [X]  denotes positive finding, [ ]  denotes negative finding Cardiac  Comments:  Chest pain or chest pressure:    Shortness of breath upon exertion:    Short of breath when lying flat:    Irregular heart rhythm:        Vascular    Pain in calf, thigh, or hip brought on by ambulation:    Pain in feet at night that wakes you up from your sleep:     Blood clot in your veins:    Leg swelling:         Pulmonary    Oxygen at home:    Productive cough:      Wheezing:         Neurologic    Sudden weakness in arms or legs:     Sudden numbness in arms or legs:     Sudden onset of difficulty speaking or slurred speech:    Temporary loss of vision in one eye:     Problems with dizziness:         Gastrointestinal    Blood in stool:     Vomited blood:         Genitourinary    Burning when urinating:     Blood in urine:        Psychiatric    Major depression:         Hematologic    Bleeding problems:    Problems with blood clotting too easily:        Skin    Rashes or ulcers:        Constitutional    Fever or chills:      PHYSICAL EXAMINATION:  ***  General:  WDWN in NAD; vital signs documented above Gait: Not observed HENT: WNL, normocephalic Pulmonary: normal non-labored breathing , without Rales, rhonchi,  wheezing Cardiac: {Desc; regular/irreg:14544} HR, without  Murmurs {With/Without:20273} carotid bruit*** Abdomen: soft, NT, no masses Skin: {With/Without:20273} rashes Vascular Exam/Pulses:  Right Left  Radial {Exam; arterial pulse strength 0-4:30167} {Exam; arterial pulse strength 0-4:30167}  Ulnar {Exam; arterial pulse strength 0-4:30167} {Exam; arterial pulse strength 0-4:30167}  Femoral {Exam; arterial pulse strength 0-4:30167} {Exam; arterial pulse strength 0-4:30167}  Popliteal {Exam; arterial pulse strength 0-4:30167} {Exam; arterial pulse strength 0-4:30167}  DP {Exam; arterial pulse strength 0-4:30167} {Exam; arterial pulse strength 0-4:30167}  PT {Exam; arterial pulse strength 0-4:30167} {Exam; arterial pulse strength 0-4:30167}   Extremities: {With/Without:20273} ischemic changes, {With/Without:20273} Gangrene , {With/Without:20273} cellulitis; {With/Without:20273} open wounds;  Musculoskeletal: no muscle wasting or atrophy  Neurologic: A&O X 3;  No focal weakness or paresthesias are detected Psychiatric:  The pt has {Desc; normal/abnormal:11317::"Normal"} affect.   Non-Invasive Vascular Imaging:   ***     ASSESSMENT/PLAN:: 58 y.o. female here for follow up for left AKA in February 2020 and subsequent debridements x 2 here for f/u for wound.   -***   [name], PA-C [facility] [phone]  Clinic MD:   Early

## 2019-07-22 DIAGNOSIS — T8754 Necrosis of amputation stump, left lower extremity: Secondary | ICD-10-CM | POA: Diagnosis not present

## 2019-07-22 DIAGNOSIS — T8744 Infection of amputation stump, left lower extremity: Secondary | ICD-10-CM | POA: Diagnosis not present

## 2019-07-22 DIAGNOSIS — Z89612 Acquired absence of left leg above knee: Secondary | ICD-10-CM | POA: Diagnosis not present

## 2019-07-22 DIAGNOSIS — G894 Chronic pain syndrome: Secondary | ICD-10-CM | POA: Diagnosis not present

## 2019-07-22 DIAGNOSIS — I70202 Unspecified atherosclerosis of native arteries of extremities, left leg: Secondary | ICD-10-CM | POA: Diagnosis not present

## 2019-07-22 DIAGNOSIS — Z4801 Encounter for change or removal of surgical wound dressing: Secondary | ICD-10-CM | POA: Diagnosis not present

## 2019-07-28 DIAGNOSIS — T8754 Necrosis of amputation stump, left lower extremity: Secondary | ICD-10-CM | POA: Diagnosis not present

## 2019-07-28 DIAGNOSIS — Z89612 Acquired absence of left leg above knee: Secondary | ICD-10-CM | POA: Diagnosis not present

## 2019-07-28 DIAGNOSIS — T8744 Infection of amputation stump, left lower extremity: Secondary | ICD-10-CM | POA: Diagnosis not present

## 2019-07-28 DIAGNOSIS — I70202 Unspecified atherosclerosis of native arteries of extremities, left leg: Secondary | ICD-10-CM | POA: Diagnosis not present

## 2019-07-28 DIAGNOSIS — G894 Chronic pain syndrome: Secondary | ICD-10-CM | POA: Diagnosis not present

## 2019-07-28 DIAGNOSIS — Z4801 Encounter for change or removal of surgical wound dressing: Secondary | ICD-10-CM | POA: Diagnosis not present

## 2019-08-02 DIAGNOSIS — G894 Chronic pain syndrome: Secondary | ICD-10-CM | POA: Diagnosis not present

## 2019-08-02 DIAGNOSIS — I70202 Unspecified atherosclerosis of native arteries of extremities, left leg: Secondary | ICD-10-CM | POA: Diagnosis not present

## 2019-08-02 DIAGNOSIS — T8744 Infection of amputation stump, left lower extremity: Secondary | ICD-10-CM | POA: Diagnosis not present

## 2019-08-02 DIAGNOSIS — Z4801 Encounter for change or removal of surgical wound dressing: Secondary | ICD-10-CM | POA: Diagnosis not present

## 2019-08-02 DIAGNOSIS — T8754 Necrosis of amputation stump, left lower extremity: Secondary | ICD-10-CM | POA: Diagnosis not present

## 2019-08-02 DIAGNOSIS — Z89612 Acquired absence of left leg above knee: Secondary | ICD-10-CM | POA: Diagnosis not present

## 2019-08-09 ENCOUNTER — Telehealth: Payer: Self-pay

## 2019-08-09 NOTE — Telephone Encounter (Signed)
Pt called and said that she needs to make an appt. She said that her leg at her amputation she believes is infected. Red, warm to touch and odor.   Appt made for pt to be seen this week by PA.   York Cerise, CMA

## 2019-08-11 ENCOUNTER — Telehealth (HOSPITAL_COMMUNITY): Payer: Self-pay | Admitting: Rehabilitation

## 2019-08-11 DIAGNOSIS — T8744 Infection of amputation stump, left lower extremity: Secondary | ICD-10-CM | POA: Diagnosis not present

## 2019-08-11 DIAGNOSIS — Z4801 Encounter for change or removal of surgical wound dressing: Secondary | ICD-10-CM | POA: Diagnosis not present

## 2019-08-11 DIAGNOSIS — Z89612 Acquired absence of left leg above knee: Secondary | ICD-10-CM | POA: Diagnosis not present

## 2019-08-11 DIAGNOSIS — I70202 Unspecified atherosclerosis of native arteries of extremities, left leg: Secondary | ICD-10-CM | POA: Diagnosis not present

## 2019-08-11 DIAGNOSIS — G894 Chronic pain syndrome: Secondary | ICD-10-CM | POA: Diagnosis not present

## 2019-08-11 DIAGNOSIS — T8754 Necrosis of amputation stump, left lower extremity: Secondary | ICD-10-CM | POA: Diagnosis not present

## 2019-08-11 NOTE — Telephone Encounter (Signed)

## 2019-08-12 ENCOUNTER — Ambulatory Visit (INDEPENDENT_AMBULATORY_CARE_PROVIDER_SITE_OTHER): Payer: Medicare Other | Admitting: Physician Assistant

## 2019-08-12 ENCOUNTER — Encounter: Payer: Self-pay | Admitting: Physician Assistant

## 2019-08-12 ENCOUNTER — Other Ambulatory Visit: Payer: Self-pay

## 2019-08-12 VITALS — BP 134/89 | HR 82 | Temp 97.6°F | Resp 14 | Ht 64.0 in | Wt 202.0 lb

## 2019-08-12 DIAGNOSIS — I739 Peripheral vascular disease, unspecified: Secondary | ICD-10-CM | POA: Diagnosis not present

## 2019-08-12 NOTE — Progress Notes (Signed)
POST OPERATIVE OFFICE NOTE    CC:  F/u for surgery  HPI:  This is a 58 y.o. female who is s/p left AKA by Dr. Scot Dock 02/16/2019.  She feel multiple times on her stump during her post operative recovery time.  This required irrigation and debridement of the AKA stump on 03/29/19 and again on 04/02/2019.  A wound vac was placed over the stump at discharge 04/03/19.  Once the wound became superficial and beefy red base the wound vac was discontinued.  Wet to dry dressing change were then implemented.  She is here today for follow up wound check.    She states the daily use of tape is irritating her skin.  The left thigh/stump feels swollen.  She denise fever and chills.  She has a [place] that checks on her and aide 7 days a week for assistants.  She takes aspirin, Lipitor, and Xarelto daily.  Allergies  Allergen Reactions  . Propoxyphene N-Acetaminophen Anaphylaxis and Swelling  . Strawberry Extract Anaphylaxis and Shortness Of Breath    "I go into shock"  . Buspar [Buspirone] Swelling    "Swells my face"  . Grapefruit Extract Other (See Comments)    Interaction with medications she's taking  . Tape Itching    Patient says that it itches and swells with some adhesive dressings.    Current Outpatient Medications  Medication Sig Dispense Refill  . albuterol (VENTOLIN HFA) 108 (90 Base) MCG/ACT inhaler Inhale 1-2 puffs into the lungs every 6 (six) hours as needed for wheezing or shortness of breath. 1 Inhaler 2  . aspirin EC 81 MG tablet Take 81 mg by mouth daily.    Marland Kitchen atorvastatin (LIPITOR) 40 MG tablet Take 1 tablet (40 mg total) by mouth at bedtime. (Patient taking differently: Take 40 mg by mouth daily. ) 90 tablet 3  . carvedilol (COREG) 12.5 MG tablet Take 0.5 tablets (6.25 mg total) by mouth 2 (two) times daily with a meal. 180 tablet 1  . diazepam (VALIUM) 2 MG tablet Take 1 tablet (2 mg total) by mouth every 12 (twelve) hours as needed for up to 4 doses for anxiety. 4 tablet 0  .  DULoxetine (CYMBALTA) 60 MG capsule Take 60 mg by mouth 2 (two) times daily.     Marland Kitchen escitalopram (LEXAPRO) 10 MG tablet Take 10 mg by mouth daily.    Marland Kitchen gabapentin (NEURONTIN) 800 MG tablet Take 800-1,600 mg by mouth See admin instructions. 800 mg twice daily, and 1600 mg at bedtime    . hydrochlorothiazide (HYDRODIURIL) 25 MG tablet Take 0.5 tablets (12.5 mg total) by mouth daily. 90 tablet 1  . HYDROcodone-acetaminophen (NORCO/VICODIN) 5-325 MG tablet Take 1 tablet by mouth every 6 (six) hours as needed for up to 6 doses for severe pain. 6 tablet 0  . hydrOXYzine (ATARAX/VISTARIL) 50 MG tablet Take 50 mg by mouth See admin instructions. Take 50 mg by mouth at bedtime and an additional 50 mg two times a day as needed for anxiety    . losartan (COZAAR) 25 MG tablet Take 1 tablet (25 mg total) by mouth daily. 90 tablet 3  . Lurasidone HCl (LATUDA) 120 MG TABS Take 80 mg by mouth 2 (two) times daily.     . Multiple Vitamins-Minerals (MULTIVITAMIN WITH MINERALS) tablet Take 1 tablet by mouth daily.    . nicotine (NICODERM CQ - DOSED IN MG/24 HR) 7 mg/24hr patch Place 1 patch (7 mg total) onto the skin daily as needed (nicotine  replacement). 28 patch 0  . polyethylene glycol (MIRALAX / GLYCOLAX) 17 g packet Take 17 g by mouth daily as needed for mild constipation. 14 each 0  . vitamin E (VITAMIN E) 400 UNIT capsule Take 800 Units by mouth 2 (two) times daily.    Alveda Reasons 10 MG TABS tablet Take 10 mg by mouth daily.  5   No current facility-administered medications for this visit.      ROS:  See HPI  Physical Exam:      Incision:  Healing slowly without skin erythema or abnormal edema. The stump is warm to touch Lungs non labored breathing Heart RRR  Assessment/Plan:  This is a 58 y.o. female who is s/p:Left AKA with repeated trauma and need for irrigation and debridement with secondary healing of the incision.     The stump appears viable and healing well.  I gave her a stockinet to place  over the stump's wet to dry dressing changes to help decrease the need for tape on the surrounding skin.   I scheduled a f/u appt for wound check in 4 weeks.    Roxy Horseman PA-C [facility] [phone]  Clinic MD:  Oneida Alar

## 2019-08-13 DIAGNOSIS — Z89612 Acquired absence of left leg above knee: Secondary | ICD-10-CM | POA: Diagnosis not present

## 2019-08-13 DIAGNOSIS — T8754 Necrosis of amputation stump, left lower extremity: Secondary | ICD-10-CM | POA: Diagnosis not present

## 2019-08-13 DIAGNOSIS — G894 Chronic pain syndrome: Secondary | ICD-10-CM | POA: Diagnosis not present

## 2019-08-13 DIAGNOSIS — I70202 Unspecified atherosclerosis of native arteries of extremities, left leg: Secondary | ICD-10-CM | POA: Diagnosis not present

## 2019-08-13 DIAGNOSIS — T8744 Infection of amputation stump, left lower extremity: Secondary | ICD-10-CM | POA: Diagnosis not present

## 2019-08-13 DIAGNOSIS — Z4801 Encounter for change or removal of surgical wound dressing: Secondary | ICD-10-CM | POA: Diagnosis not present

## 2019-08-14 DIAGNOSIS — G894 Chronic pain syndrome: Secondary | ICD-10-CM | POA: Diagnosis not present

## 2019-08-14 DIAGNOSIS — I70202 Unspecified atherosclerosis of native arteries of extremities, left leg: Secondary | ICD-10-CM | POA: Diagnosis not present

## 2019-08-14 DIAGNOSIS — Z4801 Encounter for change or removal of surgical wound dressing: Secondary | ICD-10-CM | POA: Diagnosis not present

## 2019-08-14 DIAGNOSIS — T8744 Infection of amputation stump, left lower extremity: Secondary | ICD-10-CM | POA: Diagnosis not present

## 2019-08-14 DIAGNOSIS — T8754 Necrosis of amputation stump, left lower extremity: Secondary | ICD-10-CM | POA: Diagnosis not present

## 2019-08-14 DIAGNOSIS — Z89612 Acquired absence of left leg above knee: Secondary | ICD-10-CM | POA: Diagnosis not present

## 2019-08-16 DIAGNOSIS — G894 Chronic pain syndrome: Secondary | ICD-10-CM | POA: Diagnosis not present

## 2019-08-16 DIAGNOSIS — I70202 Unspecified atherosclerosis of native arteries of extremities, left leg: Secondary | ICD-10-CM | POA: Diagnosis not present

## 2019-08-16 DIAGNOSIS — T8754 Necrosis of amputation stump, left lower extremity: Secondary | ICD-10-CM | POA: Diagnosis not present

## 2019-08-16 DIAGNOSIS — T8744 Infection of amputation stump, left lower extremity: Secondary | ICD-10-CM | POA: Diagnosis not present

## 2019-08-16 DIAGNOSIS — Z4801 Encounter for change or removal of surgical wound dressing: Secondary | ICD-10-CM | POA: Diagnosis not present

## 2019-08-16 DIAGNOSIS — Z89612 Acquired absence of left leg above knee: Secondary | ICD-10-CM | POA: Diagnosis not present

## 2019-08-17 DIAGNOSIS — I70202 Unspecified atherosclerosis of native arteries of extremities, left leg: Secondary | ICD-10-CM | POA: Diagnosis not present

## 2019-08-17 DIAGNOSIS — F17210 Nicotine dependence, cigarettes, uncomplicated: Secondary | ICD-10-CM | POA: Diagnosis not present

## 2019-08-17 DIAGNOSIS — M1711 Unilateral primary osteoarthritis, right knee: Secondary | ICD-10-CM | POA: Diagnosis not present

## 2019-08-17 DIAGNOSIS — T8754 Necrosis of amputation stump, left lower extremity: Secondary | ICD-10-CM | POA: Diagnosis not present

## 2019-08-17 DIAGNOSIS — Z7901 Long term (current) use of anticoagulants: Secondary | ICD-10-CM | POA: Diagnosis not present

## 2019-08-17 DIAGNOSIS — T8744 Infection of amputation stump, left lower extremity: Secondary | ICD-10-CM | POA: Diagnosis not present

## 2019-08-17 DIAGNOSIS — Z86718 Personal history of other venous thrombosis and embolism: Secondary | ICD-10-CM | POA: Diagnosis not present

## 2019-08-17 DIAGNOSIS — G894 Chronic pain syndrome: Secondary | ICD-10-CM | POA: Diagnosis not present

## 2019-08-17 DIAGNOSIS — Z89612 Acquired absence of left leg above knee: Secondary | ICD-10-CM | POA: Diagnosis not present

## 2019-08-17 DIAGNOSIS — F319 Bipolar disorder, unspecified: Secondary | ICD-10-CM | POA: Diagnosis not present

## 2019-08-17 DIAGNOSIS — J449 Chronic obstructive pulmonary disease, unspecified: Secondary | ICD-10-CM | POA: Diagnosis not present

## 2019-08-17 DIAGNOSIS — R41841 Cognitive communication deficit: Secondary | ICD-10-CM | POA: Diagnosis not present

## 2019-08-17 DIAGNOSIS — Z4801 Encounter for change or removal of surgical wound dressing: Secondary | ICD-10-CM | POA: Diagnosis not present

## 2019-08-17 DIAGNOSIS — I110 Hypertensive heart disease with heart failure: Secondary | ICD-10-CM | POA: Diagnosis not present

## 2019-08-17 DIAGNOSIS — I5042 Chronic combined systolic (congestive) and diastolic (congestive) heart failure: Secondary | ICD-10-CM | POA: Diagnosis not present

## 2019-08-18 DIAGNOSIS — T8744 Infection of amputation stump, left lower extremity: Secondary | ICD-10-CM | POA: Diagnosis not present

## 2019-08-18 DIAGNOSIS — Z89612 Acquired absence of left leg above knee: Secondary | ICD-10-CM | POA: Diagnosis not present

## 2019-08-18 DIAGNOSIS — T8754 Necrosis of amputation stump, left lower extremity: Secondary | ICD-10-CM | POA: Diagnosis not present

## 2019-08-18 DIAGNOSIS — I70202 Unspecified atherosclerosis of native arteries of extremities, left leg: Secondary | ICD-10-CM | POA: Diagnosis not present

## 2019-08-18 DIAGNOSIS — G894 Chronic pain syndrome: Secondary | ICD-10-CM | POA: Diagnosis not present

## 2019-08-18 DIAGNOSIS — Z4801 Encounter for change or removal of surgical wound dressing: Secondary | ICD-10-CM | POA: Diagnosis not present

## 2019-08-25 DIAGNOSIS — G894 Chronic pain syndrome: Secondary | ICD-10-CM | POA: Diagnosis not present

## 2019-08-25 DIAGNOSIS — T8744 Infection of amputation stump, left lower extremity: Secondary | ICD-10-CM | POA: Diagnosis not present

## 2019-08-25 DIAGNOSIS — I70202 Unspecified atherosclerosis of native arteries of extremities, left leg: Secondary | ICD-10-CM | POA: Diagnosis not present

## 2019-08-25 DIAGNOSIS — Z89612 Acquired absence of left leg above knee: Secondary | ICD-10-CM | POA: Diagnosis not present

## 2019-08-25 DIAGNOSIS — T8754 Necrosis of amputation stump, left lower extremity: Secondary | ICD-10-CM | POA: Diagnosis not present

## 2019-08-25 DIAGNOSIS — Z4801 Encounter for change or removal of surgical wound dressing: Secondary | ICD-10-CM | POA: Diagnosis not present

## 2019-08-31 DIAGNOSIS — T8744 Infection of amputation stump, left lower extremity: Secondary | ICD-10-CM | POA: Diagnosis not present

## 2019-08-31 DIAGNOSIS — Z4801 Encounter for change or removal of surgical wound dressing: Secondary | ICD-10-CM | POA: Diagnosis not present

## 2019-08-31 DIAGNOSIS — I70202 Unspecified atherosclerosis of native arteries of extremities, left leg: Secondary | ICD-10-CM | POA: Diagnosis not present

## 2019-08-31 DIAGNOSIS — T8754 Necrosis of amputation stump, left lower extremity: Secondary | ICD-10-CM | POA: Diagnosis not present

## 2019-08-31 DIAGNOSIS — Z89612 Acquired absence of left leg above knee: Secondary | ICD-10-CM | POA: Diagnosis not present

## 2019-08-31 DIAGNOSIS — G894 Chronic pain syndrome: Secondary | ICD-10-CM | POA: Diagnosis not present

## 2019-09-02 DIAGNOSIS — Z4801 Encounter for change or removal of surgical wound dressing: Secondary | ICD-10-CM | POA: Diagnosis not present

## 2019-09-02 DIAGNOSIS — Z89612 Acquired absence of left leg above knee: Secondary | ICD-10-CM | POA: Diagnosis not present

## 2019-09-02 DIAGNOSIS — T8744 Infection of amputation stump, left lower extremity: Secondary | ICD-10-CM | POA: Diagnosis not present

## 2019-09-02 DIAGNOSIS — T8754 Necrosis of amputation stump, left lower extremity: Secondary | ICD-10-CM | POA: Diagnosis not present

## 2019-09-02 DIAGNOSIS — I70202 Unspecified atherosclerosis of native arteries of extremities, left leg: Secondary | ICD-10-CM | POA: Diagnosis not present

## 2019-09-10 ENCOUNTER — Other Ambulatory Visit: Payer: Self-pay | Admitting: Vascular Surgery

## 2019-09-10 ENCOUNTER — Other Ambulatory Visit: Payer: Self-pay

## 2019-09-10 ENCOUNTER — Telehealth: Payer: Self-pay | Admitting: Vascular Surgery

## 2019-09-10 ENCOUNTER — Ambulatory Visit (INDEPENDENT_AMBULATORY_CARE_PROVIDER_SITE_OTHER): Payer: Self-pay | Admitting: Physician Assistant

## 2019-09-10 VITALS — BP 129/87 | HR 82 | Temp 97.6°F | Resp 20 | Ht 64.0 in | Wt 202.0 lb

## 2019-09-10 DIAGNOSIS — Z89612 Acquired absence of left leg above knee: Secondary | ICD-10-CM | POA: Diagnosis not present

## 2019-09-10 DIAGNOSIS — I739 Peripheral vascular disease, unspecified: Secondary | ICD-10-CM

## 2019-09-10 DIAGNOSIS — G894 Chronic pain syndrome: Secondary | ICD-10-CM | POA: Diagnosis not present

## 2019-09-10 DIAGNOSIS — T8744 Infection of amputation stump, left lower extremity: Secondary | ICD-10-CM

## 2019-09-10 DIAGNOSIS — Z4801 Encounter for change or removal of surgical wound dressing: Secondary | ICD-10-CM | POA: Diagnosis not present

## 2019-09-10 DIAGNOSIS — I70202 Unspecified atherosclerosis of native arteries of extremities, left leg: Secondary | ICD-10-CM | POA: Diagnosis not present

## 2019-09-10 DIAGNOSIS — T8754 Necrosis of amputation stump, left lower extremity: Secondary | ICD-10-CM | POA: Diagnosis not present

## 2019-09-10 MED ORDER — COLLAGENASE 250 UNIT/GM EX OINT: 1.0000 "application " | TOPICAL_OINTMENT | Freq: Every day | CUTANEOUS | 1 refills | Status: AC

## 2019-09-10 NOTE — Progress Notes (Signed)
POST OPERATIVE OFFICE NOTE    CC:  F/u for surgery  HPI:  Alisha [name] is a 58 y.o. female who had an above-the-knee amputation almost 2 months ago.  She had not presented for follow-up and subsequently presented with dehiscence and infection in her above-the-knee amputation.  This was debrided and then dressing changes were done.  This point it was felt that the wound was clean enough to debride and place a VAC.   s/p Excisional debridement of left above-the-knee amputation (skin, subcutaneous tissue, and fascia) Placement of VAC.  She takes aspirin, Lipitor, and Xarelto daily.  Allergies  Allergen Reactions  . Propoxyphene N-Acetaminophen Anaphylaxis and Swelling  . Strawberry Extract Anaphylaxis and Shortness Of Breath    "I go into shock"  . Buspar [Buspirone] Swelling    "Swells my face"  . Grapefruit Extract Other (See Comments)    Interaction with medications she's taking  . Tape Itching    Patient says that it itches and swells with some adhesive dressings.    Current Outpatient Medications  Medication Sig Dispense Refill  . albuterol (VENTOLIN HFA) 108 (90 Base) MCG/ACT inhaler Inhale 1-2 puffs into the lungs every 6 (six) hours as needed for wheezing or shortness of breath. 1 Inhaler 2  . aspirin EC 81 MG tablet Take 81 mg by mouth daily.    Marland Kitchen atorvastatin (LIPITOR) 40 MG tablet Take 1 tablet (40 mg total) by mouth at bedtime. (Patient taking differently: Take 40 mg by mouth daily. ) 90 tablet 3  . carvedilol (COREG) 12.5 MG tablet Take 0.5 tablets (6.25 mg total) by mouth 2 (two) times daily with a meal. 180 tablet 1  . diazepam (VALIUM) 2 MG tablet Take 1 tablet (2 mg total) by mouth every 12 (twelve) hours as needed for up to 4 doses for anxiety. 4 tablet 0  . DULoxetine (CYMBALTA) 60 MG capsule Take 60 mg by mouth 2 (two) times daily.     Marland Kitchen escitalopram (LEXAPRO) 10 MG tablet Take 10 mg by mouth daily.    Marland Kitchen gabapentin (NEURONTIN) 800 MG tablet Take 800-1,600 mg  by mouth See admin instructions. 800 mg twice daily, and 1600 mg at bedtime    . hydrochlorothiazide (HYDRODIURIL) 25 MG tablet Take 0.5 tablets (12.5 mg total) by mouth daily. 90 tablet 1  . hydrOXYzine (ATARAX/VISTARIL) 50 MG tablet Take 50 mg by mouth See admin instructions. Take 50 mg by mouth at bedtime and an additional 50 mg two times a day as needed for anxiety    . losartan (COZAAR) 25 MG tablet Take 1 tablet (25 mg total) by mouth daily. 90 tablet 3  . Lurasidone HCl (LATUDA) 120 MG TABS Take 80 mg by mouth 2 (two) times daily.     . Multiple Vitamins-Minerals (MULTIVITAMIN WITH MINERALS) tablet Take 1 tablet by mouth daily.    . nicotine (NICODERM CQ - DOSED IN MG/24 HR) 7 mg/24hr patch Place 1 patch (7 mg total) onto the skin daily as needed (nicotine replacement). 28 patch 0  . polyethylene glycol (MIRALAX / GLYCOLAX) 17 g packet Take 17 g by mouth daily as needed for mild constipation. 14 each 0  . vitamin E (VITAMIN E) 400 UNIT capsule Take 800 Units by mouth 2 (two) times daily.    Alveda Reasons 10 MG TABS tablet Take 10 mg by mouth daily.  5   No current facility-administered medications for this visit.      ROS:  See HPI  Physical Exam:  Incision:  Yellow fibrous tissue over wound.   Tenderness to palpation.  No erythema, or apparent edema.  Stump is warm and soft. Heart RRR Lungs non labored breathing Gen NAD  Assessment/Plan:  This is a 58 y.o. female who is s/p: Irrigation and debridement of left non healing wound on AKA  I have ordered santyl for debridement of the wound to help promote healing she will f/u in 2-3 weeks for a wound check.  Cont. [facility] RN for wound care. I faxed a script for santyl to her pharmacy.     Roxy Horseman PA-C [facility] [phone]  Clinic MD:  Donzetta Matters

## 2019-09-10 NOTE — Telephone Encounter (Signed)
Verbal orders were given to [facility], per [place], [place].  Start Santyl to help debride wound bed.  Nurse at [facility] made aware and will contact patient.  Ashleigh E., LPN.

## 2019-09-13 DIAGNOSIS — G894 Chronic pain syndrome: Secondary | ICD-10-CM | POA: Diagnosis not present

## 2019-09-13 DIAGNOSIS — T8754 Necrosis of amputation stump, left lower extremity: Secondary | ICD-10-CM | POA: Diagnosis not present

## 2019-09-13 DIAGNOSIS — Z4801 Encounter for change or removal of surgical wound dressing: Secondary | ICD-10-CM | POA: Diagnosis not present

## 2019-09-13 DIAGNOSIS — T8744 Infection of amputation stump, left lower extremity: Secondary | ICD-10-CM | POA: Diagnosis not present

## 2019-09-13 DIAGNOSIS — I70202 Unspecified atherosclerosis of native arteries of extremities, left leg: Secondary | ICD-10-CM | POA: Diagnosis not present

## 2019-09-13 DIAGNOSIS — Z89612 Acquired absence of left leg above knee: Secondary | ICD-10-CM | POA: Diagnosis not present

## 2019-09-16 DIAGNOSIS — Z86718 Personal history of other venous thrombosis and embolism: Secondary | ICD-10-CM | POA: Diagnosis not present

## 2019-09-16 DIAGNOSIS — I5042 Chronic combined systolic (congestive) and diastolic (congestive) heart failure: Secondary | ICD-10-CM | POA: Diagnosis not present

## 2019-09-16 DIAGNOSIS — T8754 Necrosis of amputation stump, left lower extremity: Secondary | ICD-10-CM | POA: Diagnosis not present

## 2019-09-16 DIAGNOSIS — M1711 Unilateral primary osteoarthritis, right knee: Secondary | ICD-10-CM | POA: Diagnosis not present

## 2019-09-16 DIAGNOSIS — Z7901 Long term (current) use of anticoagulants: Secondary | ICD-10-CM | POA: Diagnosis not present

## 2019-09-16 DIAGNOSIS — I70202 Unspecified atherosclerosis of native arteries of extremities, left leg: Secondary | ICD-10-CM | POA: Diagnosis not present

## 2019-09-16 DIAGNOSIS — F17210 Nicotine dependence, cigarettes, uncomplicated: Secondary | ICD-10-CM | POA: Diagnosis not present

## 2019-09-16 DIAGNOSIS — Z4801 Encounter for change or removal of surgical wound dressing: Secondary | ICD-10-CM | POA: Diagnosis not present

## 2019-09-16 DIAGNOSIS — F319 Bipolar disorder, unspecified: Secondary | ICD-10-CM | POA: Diagnosis not present

## 2019-09-16 DIAGNOSIS — T8744 Infection of amputation stump, left lower extremity: Secondary | ICD-10-CM | POA: Diagnosis not present

## 2019-09-16 DIAGNOSIS — Z89612 Acquired absence of left leg above knee: Secondary | ICD-10-CM | POA: Diagnosis not present

## 2019-09-16 DIAGNOSIS — G894 Chronic pain syndrome: Secondary | ICD-10-CM | POA: Diagnosis not present

## 2019-09-16 DIAGNOSIS — J449 Chronic obstructive pulmonary disease, unspecified: Secondary | ICD-10-CM | POA: Diagnosis not present

## 2019-09-16 DIAGNOSIS — R41841 Cognitive communication deficit: Secondary | ICD-10-CM | POA: Diagnosis not present

## 2019-09-16 DIAGNOSIS — I110 Hypertensive heart disease with heart failure: Secondary | ICD-10-CM | POA: Diagnosis not present

## 2019-09-21 DIAGNOSIS — T8754 Necrosis of amputation stump, left lower extremity: Secondary | ICD-10-CM | POA: Diagnosis not present

## 2019-09-21 DIAGNOSIS — I70202 Unspecified atherosclerosis of native arteries of extremities, left leg: Secondary | ICD-10-CM | POA: Diagnosis not present

## 2019-09-21 DIAGNOSIS — T8744 Infection of amputation stump, left lower extremity: Secondary | ICD-10-CM | POA: Diagnosis not present

## 2019-09-21 DIAGNOSIS — Z4801 Encounter for change or removal of surgical wound dressing: Secondary | ICD-10-CM | POA: Diagnosis not present

## 2019-09-21 DIAGNOSIS — Z89612 Acquired absence of left leg above knee: Secondary | ICD-10-CM | POA: Diagnosis not present

## 2019-09-21 DIAGNOSIS — G894 Chronic pain syndrome: Secondary | ICD-10-CM | POA: Diagnosis not present

## 2019-09-28 DIAGNOSIS — T8754 Necrosis of amputation stump, left lower extremity: Secondary | ICD-10-CM | POA: Diagnosis not present

## 2019-09-28 DIAGNOSIS — I70202 Unspecified atherosclerosis of native arteries of extremities, left leg: Secondary | ICD-10-CM | POA: Diagnosis not present

## 2019-09-28 DIAGNOSIS — Z89612 Acquired absence of left leg above knee: Secondary | ICD-10-CM | POA: Diagnosis not present

## 2019-09-28 DIAGNOSIS — Z4801 Encounter for change or removal of surgical wound dressing: Secondary | ICD-10-CM | POA: Diagnosis not present

## 2019-09-28 DIAGNOSIS — T8744 Infection of amputation stump, left lower extremity: Secondary | ICD-10-CM | POA: Diagnosis not present

## 2019-09-28 DIAGNOSIS — G894 Chronic pain syndrome: Secondary | ICD-10-CM | POA: Diagnosis not present

## 2019-09-30 ENCOUNTER — Ambulatory Visit: Payer: Medicare Other

## 2019-10-06 DIAGNOSIS — Z4801 Encounter for change or removal of surgical wound dressing: Secondary | ICD-10-CM | POA: Diagnosis not present

## 2019-10-06 DIAGNOSIS — I70202 Unspecified atherosclerosis of native arteries of extremities, left leg: Secondary | ICD-10-CM | POA: Diagnosis not present

## 2019-10-06 DIAGNOSIS — Z89612 Acquired absence of left leg above knee: Secondary | ICD-10-CM | POA: Diagnosis not present

## 2019-10-06 DIAGNOSIS — T8754 Necrosis of amputation stump, left lower extremity: Secondary | ICD-10-CM | POA: Diagnosis not present

## 2019-10-06 DIAGNOSIS — T8744 Infection of amputation stump, left lower extremity: Secondary | ICD-10-CM | POA: Diagnosis not present

## 2019-10-06 DIAGNOSIS — G894 Chronic pain syndrome: Secondary | ICD-10-CM | POA: Diagnosis not present

## 2019-10-07 ENCOUNTER — Ambulatory Visit: Payer: Medicare Other | Attending: Internal Medicine | Admitting: Physical Therapy

## 2019-10-12 DIAGNOSIS — I70202 Unspecified atherosclerosis of native arteries of extremities, left leg: Secondary | ICD-10-CM | POA: Diagnosis not present

## 2019-10-12 DIAGNOSIS — T8754 Necrosis of amputation stump, left lower extremity: Secondary | ICD-10-CM | POA: Diagnosis not present

## 2019-10-12 DIAGNOSIS — Z4801 Encounter for change or removal of surgical wound dressing: Secondary | ICD-10-CM | POA: Diagnosis not present

## 2019-10-12 DIAGNOSIS — T8744 Infection of amputation stump, left lower extremity: Secondary | ICD-10-CM | POA: Diagnosis not present

## 2019-10-12 DIAGNOSIS — Z89612 Acquired absence of left leg above knee: Secondary | ICD-10-CM | POA: Diagnosis not present

## 2019-10-12 DIAGNOSIS — G894 Chronic pain syndrome: Secondary | ICD-10-CM | POA: Diagnosis not present

## 2019-10-16 DIAGNOSIS — F319 Bipolar disorder, unspecified: Secondary | ICD-10-CM | POA: Diagnosis not present

## 2019-10-16 DIAGNOSIS — I110 Hypertensive heart disease with heart failure: Secondary | ICD-10-CM | POA: Diagnosis not present

## 2019-10-16 DIAGNOSIS — R41841 Cognitive communication deficit: Secondary | ICD-10-CM | POA: Diagnosis not present

## 2019-10-16 DIAGNOSIS — I5042 Chronic combined systolic (congestive) and diastolic (congestive) heart failure: Secondary | ICD-10-CM | POA: Diagnosis not present

## 2019-10-16 DIAGNOSIS — I70202 Unspecified atherosclerosis of native arteries of extremities, left leg: Secondary | ICD-10-CM | POA: Diagnosis not present

## 2019-10-16 DIAGNOSIS — J449 Chronic obstructive pulmonary disease, unspecified: Secondary | ICD-10-CM | POA: Diagnosis not present

## 2019-10-16 DIAGNOSIS — Z4801 Encounter for change or removal of surgical wound dressing: Secondary | ICD-10-CM | POA: Diagnosis not present

## 2019-10-16 DIAGNOSIS — T8744 Infection of amputation stump, left lower extremity: Secondary | ICD-10-CM | POA: Diagnosis not present

## 2019-10-16 DIAGNOSIS — F17210 Nicotine dependence, cigarettes, uncomplicated: Secondary | ICD-10-CM | POA: Diagnosis not present

## 2019-10-16 DIAGNOSIS — Z7901 Long term (current) use of anticoagulants: Secondary | ICD-10-CM | POA: Diagnosis not present

## 2019-10-16 DIAGNOSIS — Z86718 Personal history of other venous thrombosis and embolism: Secondary | ICD-10-CM | POA: Diagnosis not present

## 2019-10-16 DIAGNOSIS — M1711 Unilateral primary osteoarthritis, right knee: Secondary | ICD-10-CM | POA: Diagnosis not present

## 2019-10-16 DIAGNOSIS — T8754 Necrosis of amputation stump, left lower extremity: Secondary | ICD-10-CM | POA: Diagnosis not present

## 2019-10-16 DIAGNOSIS — G894 Chronic pain syndrome: Secondary | ICD-10-CM | POA: Diagnosis not present

## 2019-10-16 DIAGNOSIS — Z89612 Acquired absence of left leg above knee: Secondary | ICD-10-CM | POA: Diagnosis not present

## 2019-10-20 DIAGNOSIS — G894 Chronic pain syndrome: Secondary | ICD-10-CM | POA: Diagnosis not present

## 2019-10-20 DIAGNOSIS — I70202 Unspecified atherosclerosis of native arteries of extremities, left leg: Secondary | ICD-10-CM | POA: Diagnosis not present

## 2019-10-20 DIAGNOSIS — Z89612 Acquired absence of left leg above knee: Secondary | ICD-10-CM | POA: Diagnosis not present

## 2019-10-20 DIAGNOSIS — T8744 Infection of amputation stump, left lower extremity: Secondary | ICD-10-CM | POA: Diagnosis not present

## 2019-10-20 DIAGNOSIS — Z4801 Encounter for change or removal of surgical wound dressing: Secondary | ICD-10-CM | POA: Diagnosis not present

## 2019-10-20 DIAGNOSIS — T8754 Necrosis of amputation stump, left lower extremity: Secondary | ICD-10-CM | POA: Diagnosis not present

## 2019-10-27 DIAGNOSIS — Z4801 Encounter for change or removal of surgical wound dressing: Secondary | ICD-10-CM | POA: Diagnosis not present

## 2019-10-27 DIAGNOSIS — G894 Chronic pain syndrome: Secondary | ICD-10-CM | POA: Diagnosis not present

## 2019-10-27 DIAGNOSIS — T8754 Necrosis of amputation stump, left lower extremity: Secondary | ICD-10-CM | POA: Diagnosis not present

## 2019-10-27 DIAGNOSIS — Z89612 Acquired absence of left leg above knee: Secondary | ICD-10-CM | POA: Diagnosis not present

## 2019-10-27 DIAGNOSIS — T8744 Infection of amputation stump, left lower extremity: Secondary | ICD-10-CM | POA: Diagnosis not present

## 2019-10-27 DIAGNOSIS — I70202 Unspecified atherosclerosis of native arteries of extremities, left leg: Secondary | ICD-10-CM | POA: Diagnosis not present

## 2019-11-03 DIAGNOSIS — G894 Chronic pain syndrome: Secondary | ICD-10-CM | POA: Diagnosis not present

## 2019-11-03 DIAGNOSIS — T8754 Necrosis of amputation stump, left lower extremity: Secondary | ICD-10-CM | POA: Diagnosis not present

## 2019-11-03 DIAGNOSIS — T8744 Infection of amputation stump, left lower extremity: Secondary | ICD-10-CM | POA: Diagnosis not present

## 2019-11-03 DIAGNOSIS — Z4801 Encounter for change or removal of surgical wound dressing: Secondary | ICD-10-CM | POA: Diagnosis not present

## 2019-11-03 DIAGNOSIS — I70202 Unspecified atherosclerosis of native arteries of extremities, left leg: Secondary | ICD-10-CM | POA: Diagnosis not present

## 2019-11-03 DIAGNOSIS — Z89612 Acquired absence of left leg above knee: Secondary | ICD-10-CM | POA: Diagnosis not present

## 2019-11-12 DIAGNOSIS — G894 Chronic pain syndrome: Secondary | ICD-10-CM | POA: Diagnosis not present

## 2019-11-12 DIAGNOSIS — I70202 Unspecified atherosclerosis of native arteries of extremities, left leg: Secondary | ICD-10-CM | POA: Diagnosis not present

## 2019-11-12 DIAGNOSIS — T8754 Necrosis of amputation stump, left lower extremity: Secondary | ICD-10-CM | POA: Diagnosis not present

## 2019-11-12 DIAGNOSIS — Z4801 Encounter for change or removal of surgical wound dressing: Secondary | ICD-10-CM | POA: Diagnosis not present

## 2019-11-12 DIAGNOSIS — T8744 Infection of amputation stump, left lower extremity: Secondary | ICD-10-CM | POA: Diagnosis not present

## 2019-11-12 DIAGNOSIS — Z89612 Acquired absence of left leg above knee: Secondary | ICD-10-CM | POA: Diagnosis not present

## 2019-11-15 DIAGNOSIS — T8744 Infection of amputation stump, left lower extremity: Secondary | ICD-10-CM | POA: Diagnosis not present

## 2019-11-15 DIAGNOSIS — I5042 Chronic combined systolic (congestive) and diastolic (congestive) heart failure: Secondary | ICD-10-CM | POA: Diagnosis not present

## 2019-11-15 DIAGNOSIS — Z7901 Long term (current) use of anticoagulants: Secondary | ICD-10-CM | POA: Diagnosis not present

## 2019-11-15 DIAGNOSIS — Z86718 Personal history of other venous thrombosis and embolism: Secondary | ICD-10-CM | POA: Diagnosis not present

## 2019-11-15 DIAGNOSIS — I110 Hypertensive heart disease with heart failure: Secondary | ICD-10-CM | POA: Diagnosis not present

## 2019-11-15 DIAGNOSIS — G894 Chronic pain syndrome: Secondary | ICD-10-CM | POA: Diagnosis not present

## 2019-11-15 DIAGNOSIS — I70202 Unspecified atherosclerosis of native arteries of extremities, left leg: Secondary | ICD-10-CM | POA: Diagnosis not present

## 2019-11-15 DIAGNOSIS — T8754 Necrosis of amputation stump, left lower extremity: Secondary | ICD-10-CM | POA: Diagnosis not present

## 2019-11-15 DIAGNOSIS — M1711 Unilateral primary osteoarthritis, right knee: Secondary | ICD-10-CM | POA: Diagnosis not present

## 2019-11-15 DIAGNOSIS — F319 Bipolar disorder, unspecified: Secondary | ICD-10-CM | POA: Diagnosis not present

## 2019-11-15 DIAGNOSIS — F17210 Nicotine dependence, cigarettes, uncomplicated: Secondary | ICD-10-CM | POA: Diagnosis not present

## 2019-11-15 DIAGNOSIS — R41841 Cognitive communication deficit: Secondary | ICD-10-CM | POA: Diagnosis not present

## 2019-11-15 DIAGNOSIS — J449 Chronic obstructive pulmonary disease, unspecified: Secondary | ICD-10-CM | POA: Diagnosis not present

## 2019-11-15 DIAGNOSIS — Z89612 Acquired absence of left leg above knee: Secondary | ICD-10-CM | POA: Diagnosis not present

## 2019-11-15 DIAGNOSIS — Z4801 Encounter for change or removal of surgical wound dressing: Secondary | ICD-10-CM | POA: Diagnosis not present

## 2019-11-18 DIAGNOSIS — Z89612 Acquired absence of left leg above knee: Secondary | ICD-10-CM | POA: Diagnosis not present

## 2019-11-18 DIAGNOSIS — I70202 Unspecified atherosclerosis of native arteries of extremities, left leg: Secondary | ICD-10-CM | POA: Diagnosis not present

## 2019-11-18 DIAGNOSIS — T8754 Necrosis of amputation stump, left lower extremity: Secondary | ICD-10-CM | POA: Diagnosis not present

## 2019-11-18 DIAGNOSIS — Z4801 Encounter for change or removal of surgical wound dressing: Secondary | ICD-10-CM | POA: Diagnosis not present

## 2019-11-18 DIAGNOSIS — G894 Chronic pain syndrome: Secondary | ICD-10-CM | POA: Diagnosis not present

## 2019-11-18 DIAGNOSIS — T8744 Infection of amputation stump, left lower extremity: Secondary | ICD-10-CM | POA: Diagnosis not present

## 2019-12-02 ENCOUNTER — Other Ambulatory Visit: Payer: Self-pay

## 2019-12-02 ENCOUNTER — Ambulatory Visit: Payer: Medicare Other | Attending: Internal Medicine | Admitting: Physical Therapy

## 2019-12-02 DIAGNOSIS — M6281 Muscle weakness (generalized): Secondary | ICD-10-CM | POA: Diagnosis present

## 2019-12-02 DIAGNOSIS — R208 Other disturbances of skin sensation: Secondary | ICD-10-CM

## 2019-12-02 DIAGNOSIS — R209 Unspecified disturbances of skin sensation: Secondary | ICD-10-CM | POA: Diagnosis present

## 2019-12-02 DIAGNOSIS — M79605 Pain in left leg: Secondary | ICD-10-CM | POA: Diagnosis present

## 2019-12-02 DIAGNOSIS — R296 Repeated falls: Secondary | ICD-10-CM | POA: Insufficient documentation

## 2019-12-05 NOTE — Therapy (Addendum)
[facility] [address] [place], [place], [address] Phone: [phone]   Fax:  [phone]  Physical Therapy Evaluation  Patient Details  Name: Alisha [name] MRN: [id number] Date of Birth: [birth year] Referring Provider (PT): Nolene Ebbs, MD   Encounter Date: 12/02/2019  PT End of Session - 12/05/19 2002    Visit Number  1    Number of Visits  1   one time visit for power w/c evaluation   Date for PT Re-Evaluation  12/02/19    Authorization Type  Medicare and Medicaid    PT Start Time  3254    PT Stop Time  1130    PT Time Calculation (min)  75 min    Activity Tolerance  Patient tolerated treatment well    Behavior During Therapy  [facility] for tasks assessed/performed       Past Medical History:  Diagnosis Date  . Anxiety   . Asthma   . Bipolar disorder ([place])   . Breast lump 02/2008   Biopsy 05/2008: showed no evidence of malignancy  . Breast mass in female    bi lat   . CHF (congestive heart failure) ([place])   . Chronic pain syndrome   . Degenerative joint disease of knee   . Dental caries   . Depression   . 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 Endometrial mass 10/12/2012   Endometrium, biopsy on 11/04/12 - PROLIFERATIVE ENDOMETRIUM AND ABUNDANT MUCUS. NO HYPERPLASIA OR CARCINOMA.   . Fibromyalgia   . Hyperlipidemia   . Hypertension   . Irregular menses 9/08  . Joint pain   . Mixed stress and urge urinary incontinence    Being followed by [facility], underwent cystoscopy & uroflowmetry   . Pulmonary edema   . PVD (peripheral vascular disease) ([place])    s/p L fem-pop bypass 11/21/08; graft occluded 12/28/08;  aortogram w/ bilat LE runoff: 1.  bilat diffuse SFA occlusive dz, 2.  Mod to severe above-knee popliteal dz,  3.  Bilat 3-vessel runoff w/ mild tibial occlusive dz.  . Restless leg syndrome   . Sigmoid diverticulitis 10/26/2012  .  Tobacco abuse   . Wears glasses     Past Surgical History:  Procedure Laterality Date  .  Right external iliac artery stent  11/04/2008  . ABDOMINAL AORTAGRAM     11/04/2008; 10/14/2006  . AMPUTATION Left 02/16/2019   Procedure: AMPUTATION ABOVE KNEE;  Surgeon: Angelia Mould, MD;  Location: [facility];  Service: Vascular;  Laterality: Left;  . AMPUTATION Left 04/02/2019   Procedure: IRRIGATION AND DEBRIDEMENT LEFT ABOVE KNEE AMPUTATION;  Surgeon: Angelia Mould, MD;  Location: [facility];  Service: Vascular;  Laterality: Left;  . APPLICATION OF WOUND VAC Left 04/02/2019   Procedure: APPLICATION OF WOUND VAC TO LEFT KNEE AMPUTATION;  Surgeon: Angelia Mould, MD;  Location: [facility];  Service: Vascular;  Laterality: Left;  . BREAST LUMPECTOMY W/ NEEDLE LOCALIZATION Right 06/13/2008  . BREAST LUMPECTOMY WITH RADIOACTIVE SEED LOCALIZATION Bilateral 05/03/2014   Procedure: BREAST LUMPECTOMY WITH RADIOACTIVE SEED LOCALIZATION;  Surgeon: Joyice Faster. Cornett, MD;  Location: [facility];  Service: General;  Laterality: Bilateral;  . COLONOSCOPY WITH PROPOFOL  01/25/2014  . FEMORAL ENDARTERECTOMY Left 11/21/2008  . FEMORAL-POPLITEAL BYPASS GRAFT Left 11/21/2008  . I&D EXTREMITY Left 03/29/2019   Procedure: IRRIGATION AND DEBRIDEMENT LEFT ABOVE KNEE AMPUTATION;  Surgeon: Elam Dutch, MD;  Location:  [facility];  Service: Vascular;  Laterality: Left;  . LEFT HEART CATHETERIZATION WITH CORONARY ANGIOGRAM N/A 02/25/2014   Procedure: LEFT HEART CATHETERIZATION WITH CORONARY ANGIOGRAM;  Surgeon: Peter M Jordan, MD;  Location: [facility];  Service: Cardiovascular;  Laterality: N/A;  . TONSILLECTOMY    . TUBAL LIGATION      There were no vitals filed for this visit.   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  Past surgical history includes L heart catheterization with coronary angiogram, femoral endarterectomy, breast lumpectomy with radioactive seed localization, and R external iliac artery stent    Patient Stated Goals  to increase independent mobility    Currently in Pain?  Yes         [facility] PT Assessment - 12/05/19 1958      Assessment   Medical Diagnosis  L AKA, power mobility evaluation    Referring Provider (PT)  Edwin Avbuere, MD    Onset Date/Surgical Date  09/11/19    Prior Therapy  unknown      Precautions   Precautions  Fall      Balance Screen   Has the patient fallen in the past 6 months  Yes      Prior Function   Level of Independence  Needs assistance with ADLs;Needs assistance with transfers;Needs assistance with homemaking        Wheelchair evaluation was completed at today's evaluation.  Paperwork completed and provided to wheelchair vendor from [employer].  Face to face evaluation with MD to follow for further sign off for power mobility.  Copy of PT letter of medical necessity available in EPIC.       Mobility/Seating Evaluation    PATIENT INFORMATION: Name: Alisha [name] DOB: 07/14/1961  Sex: Female Date seen: 12/02/2019 Time: 10:15  Address:  [address]  [place] [address] Physician: Edwin Avbuere, MD This evaluation/justification form will serve as the LMN for the following suppliers: __________________________ Supplier: [employer] Contact Person: Joshua Cadle, ATP Phone:  [phone]   Seating Therapist: Robynne Roat, PT Phone:   [phone]   Phone: [phone]    Spouse/Parent/Caregiver name: Caregiver - Alisha [name]  Phone number: [phone] Insurance/Payer: Medicare and Medicaid     Reason for Referral: Power mobility evaluation   Patient/Caregiver Goals: To maintain independent mobility  Patient was seen for face-to-face evaluation for new power wheelchair.  Also present was patient's caregiver and Joshua Cadle, ATP to discuss recommendations and wheelchair options.  Further paperwork was completed and sent to vendor.  Patient appears to qualify for power mobility device at this time per objective findings.   MEDICAL HISTORY: Diagnosis: Primary Diagnosis: L AKA; HH wound care for slow healing wound on residual limb Onset: 01/2019 Diagnosis: Congestive Heart Failure   []Progressive Disease Relevant past and future surgeries: Past surgical history: L AKA due to infection 02/16/2019 and I&D with application of wound vac on 04/02/2019, L heart catheterization with coronary angiogram, femoral endarterectomy, breast lumpectomy with radioactive seed localization,   and R external iliac artery stent   Height: 5'5" Weight: 205 lb Explain recent changes or trends in weight: Some weight gain since amputation due to decreased mobility   History including Falls: PVD s/p fem-pop bypass, pulmonary edema, stress and urge urinary incontinence, HTN, HLD, fibromyalgia, DVT, depression, DJD, chronic pain, CHF, bipolar disorder, asthma, anxiety, breast mass/lump    HOME ENVIRONMENT: []House  []Condo/town home  [x]Apartment  []Assisted Living    [x]Lives Alone [] Lives with Others                                                                                          Hours with caregiver: 3 hours a day, 7 days a week  [x]Home is accessible to patient           Stairs      []Yes [] No     Ramp []Yes []No Comments:  Level entry into apartment building with elevator to access apartment.  Apartment is handicap accessible with smooth floors, open floor plan and wide doors.   COMMUNITY ADL: TRANSPORTATION: []Car    []Van     [x]Public Transportation    []Adapted w/c Lift    []Ambulance    []Other:       [x]Sits in wheelchair during transport  Employment/School: ????? Specific requirements pertaining to mobility ?????  Other: Would like to attend specific community programs but unable to right now with current manual wheelchair; has to go out onto pavement/sidewalk to use public transportation for appointments.    FUNCTIONAL/SENSORY PROCESSING SKILLS:  Handedness:   [x]Right     []Left    []NA  Comments:  ?????  Functional Processing Skills for Wheeled Mobility [x]Processing Skills are adequate for safe wheelchair operation  Areas of concern than may interfere with safe operation of wheelchair Description of problem   [] Attention to environment      []Judgment      [] Hearing  [] Vision or visual processing      []Motor Planning  [] Fluctuations in Behavior  ?????    VERBAL COMMUNICATION: [x]WFL receptive [x] WFL expressive []Understandable  []Difficult to understand  []non-communicative [] Uses an augmented communication device  CURRENT SEATING / MOBILITY: Current Mobility Base:  []None []Dependent [x]Manual []Scooter []Power  Type of Control: ?????  Manufacturer:  UnknownSize:  18 x 16Age: unknown, was donated to patient by hospital, second hand  Current Condition of Mobility Base:  Poor - came without cushion, had to tape arm rests, came without foot rests, one brake is malfunctioning   Current Wheelchair components:  vinyl back and seat, fixed arm rests  Describe posture in present seating system:  Pt has to sit on a pillow, pt reports she slides forward in the chair      SENSATION and SKIN ISSUES: Sensation []Intact  [x]Impaired []Absent  Level of sensation: bilat LE due to PVD Pressure Relief: Able to perform effective pressure relief :    []Yes  [x] No Method: Has to get out of the chair and lie down If not, Why?: lack of UE strength, lack of ability to safely stand  Skin Issues/Skin  Integrity Current Skin Issues  [x]Yes []No []Intact [] Red area[x] Open Area  []Scar Tissue []At risk from prolonged sitting Where  L residual limb following surgery and infection - delayed healing due to impaired circulation  History of Skin Issues  []Yes [x]No Where  ????? When  ?????  Hx of skin flap surgeries  []Yes [x]No Where  ????? When  ?????  Limited sitting tolerance [x]Yes []No Hours spent sitting in wheelchair daily: 1.5-2 hours  Complaint of Pain:  Please describe: bilat LE due to DVT, PVD, phantom limb sensation; pain in buttocks from prolonged sitting    Swelling/Edema: Yes bilat LE due to PVD and CHF - has to lie down to elevate LE   ADL STATUS (in reference to wheelchair use):  Indep Assist Unable Indep with Equip Not assessed Comments  Dressing ????? X ????? ????? ????? Seated in the wheelchair with assist of caregiver  Eating ????? ????? ????? X ????? ?????  Toileting ????? X ????? ????? ????? transfers from w/c <> BSC with assist of caregiver  Bathing ????? X ????? ????? ????? transfers from w/c <> shower chair with assist of caregiver  Grooming/Hygiene ????? X ????? ????? ????? seated in wheelchair, caregiver assist  Meal Prep ????? X ????? ????? ????? only able to use microwave when in manual wheelchair  IADLS ????? X ????? ????? ????? With caregiver present to assist  Bowel Management: []Continent  []Incontinent  [x]Accidents Comments:  When it takes too long to get to a bathroom  Bladder Management: []Continent  []Incontinent  [x]Accidents Comments:  When it takes too long to get to a bathroom     WHEELCHAIR SKILLS: Manual w/c Propulsion: []UE or LE strength and endurance sufficient to participate in ADLs using manual wheelchair Arm : []left []right   []Both      Distance: 40' Foot:  []left []right   []Both  Operate Scooter: [] Strength, hand grip, balance and transfer appropriate for use []Living environment is accessible for use of scooter  Operate Power  w/c:  [x] Std. Joystick   [] Alternative Controls Indep [x] Assist [] Dependent/unable [] N/A []  [x]Safe          [x] Functional      Distance: >1,000  Bed confined without wheelchair [x] Yes [] No   STRENGTH/RANGE OF MOTION:  Active Range of Motion Strength  Shoulder Flexion to about 100 deg bilaterally before pt compensates with posterior trunk lean 3+/5 bilaterally  Elbow WFL 4-/5 bilaterally  Wrist/Hand WFL 4-/5 bilaterally  Hip Limited extension ROM; keeps L residual limb supported on pillow in supine due to edema 3+/5 RLE; LLE not tested due to wound  Knee RLE: [facility] RLE: 3+/5  Ankle R ankle lacking 40 degrees to neutral dorsiflexion 2/5     MOBILITY/BALANCE:  [] Patient is totally dependent for mobility  ?????    Balance Transfers Ambulation  Sitting Balance: Standing Balance: [] Independent [] Independent/Modified Independent  [] WFL     [] [facility] [x] Supervision [] Supervision  [x] Uses UE for balance  [] Supervision [] Min Assist [] Ambulates with Assist  ?????    [] Min Assist [] Min assist [] Mod Assist [] Ambulates with Device:      [] RW  [] StW  [] Cane  [] ?????  [] Mod Assist [] Mod assist [] Max assist   [] Max Assist [] Max assist [] Dependent [] Indep. Short Distance Only  [] Unable [x] Unable [] Lift /  Sling Required Distance (in feet)  ?????   [] Sliding board [x] Unable to Ambulate (see explanation below)  Cardio Status:  []Intact  [x] Impaired   [] NA     CHF, HTN  Respiratory Status:  []Intact   [x]Impaired   []NA     pulmonary edema, asthma  Orthotics/Prosthetics: Not able to use prosthesis due wound on L residual limb  Comments (Address manual vs power w/c vs scooter): Alisha [name] has a mobility deficit that significantly limits her ability to perform MRADLs independently which cannot be remediated with a cane or walker as she is unable to stand or ambulate due to a left above knee amputation that required I&D and wound vac and ongoing wound care due to  delayed healing.  Due to this, Alisha [name] is not appropriate for the use of a left leg prosthesis.  Alisha [name] is currently using a second hand manual wheelchair donated to her by the hospital but is unable to propel the manual wheelchair functional distances due to limited UE shoulder ROM, decreased UE strength and impaired cardiopulmonary function.  Alisha [name] does require the use of a chair to perform her ADLs but is only able to perform them when her caregiver is present to assist her with mobility within her apartment.  The manual wheelchair is also inappropriately sized for Alisha [name] and does not provide her with the specialty seating needed for skin protection and LE positioning for pressure relief and edema management.  Due to her lack of UE strength and inability to stand for effective pressure relief, Alisha [name] is only able to tolerate 1.5 - 2 hours up in the manual chair and then must return to bed for pressure relief and LE edema management.  Alisha [name] would not be able to safely and independently transfer on and off of an electric scooter (POV) and would not be able to sit safely on the scooter due to her balance impairments and increased reliance on UE to maintain static and dynamic sitting balance.  Due to her UE ROM and strength impairments, Ms. Badman would not be able to safely drive an electric scooter (POV).  Alisha [name] requires the use of a power wheelchair in order to be more independent with her mobility and MRADLs within her apartment, especially when her caregiver is not present (caregiver is only present 3 hours each day).  She also requires the specialty seating components not offered by the second hand manual wheelchair she is currently using to provide her with appropriate skin protection, pressure relief, LE edema management, postural control, and rest periods.  Utilizing the power wheelchair for rest periods will allow Alisha [name] to decrease the frequency of transfers she performs each day and  decreases her risk for falls.         Anterior / Posterior Obliquity Rotation-Pelvis ?????  PELVIS    [] [x] []  Neutral Posterior Anterior  [] [] [x]  WFL Rt elev Lt elev  [] [] [x]  WFL Right Left                      Anterior    Anterior     [] Fixed [] Other [] Partly Flexible [x] Flexible   [] Fixed [] Other [x] Partly Flexible  [] Flexible  [] Fixed [] Other [x] Partly Flexible  [] Flexible   TRUNK  [x] [] []  WFL ? Thoracic ? Lumbar  Kyphosis Lordosis  [x] [] []  WFL Convex Convex  Right Left []c-curve []s-curve []multiple  [x] Neutral [] Left-anterior [] Right-anterior     [] Fixed [] Flexible [] Partly Flexible [] Other  [] Fixed [] Flexible [] Partly Flexible [] Other  [] Fixed             [] Flexible [] Partly Flexible [] Other    Position Windswept  ?????  HIPS          []           [x]              []   Neutral       Abduct        ADduct         [x]          []           []  Neutral Right           Left      [] Fixed [] Subluxed [x] Partly Flexible [] Dislocated [] Flexible  [] Fixed [] Other [] Partly Flexible  [] Flexible                 Foot Positioning Knee Positioning  L AKA    [] WFL  []Lt []Rt [] WFL  []Lt [x]Rt    KNEES ROM concerns: ROM concerns:    & Dorsi-Flexed []Lt []Rt ?????    FEET Plantar Flexed []Lt [x]Rt      Inversion                 []Lt []Rt      Eversion                 []Lt []Rt     HEAD [x] Functional [x] Good Head Control  ?????  & [] Flexed         [] Extended [] Adequate Head Control    NECK [] Rotated  Lt  [] Lat Flexed Lt [] Rotated  Rt [] Lat Flexed Rt [] Limited Head Control     [] Cervical Hyperextension [] Absent  Head Control     SHOULDERS ELBOWS WRIST& HAND ?????      Left     Right    Left     Right    Left     Right   U/E []Functional           []Functional WFL WFL []Fisting             []Fisting      [x]elev   []dep      []elev   []dep       [x]pro -[]retract     [x]pro  []retract []subluxed              []subluxed           Goals for Wheelchair Mobility  [x] Independence with mobility in the home with motor related ADLs (MRADLs)  [x] Independence with MRADLs in the community [] Provide dependent mobility  [] Provide recline     [x]Provide tilt   Goals for Seating system [x] Optimize pressure distribution [x] Provide support needed to facilitate function or safety [] Provide corrective forces to assist with maintaining or improving posture [] Accommodate client's posture:   current seated postures and positions are not flexible or will not tolerate corrective forces [x] Client to be independent with relieving pressure in the wheelchair [x]Enhance physiological function   such as breathing, swallowing, digestion  Simulation ideas/Equipment trials:????? State why other equipment was unsuccessful:?????   MOBILITY BASE RECOMMENDATIONS and JUSTIFICATION: MOBILITY COMPONENT JUSTIFICATION  Manufacturer: QuantumModel: Jay [id number]   Size: Width 19Seat Depth 18 [x]provide transport from point A to B      [x]promote Indep mobility  [x]is not a safe, functional ambulator [x]walker or cane inadequate []non-standard width/depth necessary to accommodate anatomical measurement [] ?????  []Manual Mobility Base []non-functional ambulator    []Scooter/POV  []can safely operate  []can safely transfer   []has adequate trunk stability  []cannot functionally propel manual w/c  [x]Power Mobility Base  [x]non-ambulatory  [x]cannot functionally propel manual wheelchair  [x] cannot functionally and safely operate scooter/POV [x]can safely operate and willing to  []Stroller Base []infant/child  []unable to propel manual wheelchair []allows for growth []non-functional ambulator []non-functional UE []Indep mobility is not a goal at this time  [x]Tilt  []Forward [x]Backward [x]Powered tilt  []Manual tilt  [x]change position against gravitational force on head and shoulders  [x]change position for  pressure relief/cannot weight shift []transfers  []management of tone [x]rest periods [x]control edema []facilitate postural control  [] ?????  []Recline  []Power recline on power base []Manual recline on manual base  []accommodate femur to back angle  []bring to full recline for ADL care  []change position for pressure relief/cannot weight shift []rest periods []repositioning for transfers or clothing/diaper /catheter changes []head positioning  []Lighter weight required []self- propulsion  []lifting [] ?????  []Heavy Duty required []user weight greater than 250# []extreme tone/ over active movement []broken frame on previous chair [] ?????  [x] Back  [x] Angle Adjustable [] Custom molded Synergy Back [x]postural control []control of tone/spasticity []accommodation of range of motion []UE functional control []accommodation for seating system [] ????? []provide lateral trunk support []accommodate deformity [x]provide posterior trunk support [x]provide lumbar/sacral support [x]support trunk in midline [x]Pressure relief over spinal processes  [x] Seat Cushion Zen Skin Protection [x]impaired sensation  []decubitus ulcers present []history of pressure ulceration []prevent pelvic extension [x]low maintenance  [x]stabilize pelvis  []accommodate obliquity []accommodate multiple deformity [x]neutralize lower extremity position [x]increase pressure distribution [] ?????  [] Pelvic/thigh support  [] Lateral thigh guide [] Distal medial pad  [] Distal lateral pad [] pelvis in neutral []accommodate pelvis [] position upper legs [] alignment [] accommodate ROM [] decr adduction []accommodate tone []removable for transfers []decr abduction  [] Lateral trunk Supports [] Lt     [] Rt []decrease lateral trunk leaning []control tone []contour for increased contact []safety  []accommodate asymmetry [] ?????  [x] Mounting hardware  []lateral trunk supports  []back    []seat [x]headrest      [] thigh support []fixed   []swing away []attach seat platform/cushion to w/c frame []attach back cushion to w/c frame []mount postural supports [x]mount headrest  []swing medial thigh support away []swing lateral supports away for transfers  [] ?????    Armrests  []fixed [x]adjustable height []removable   []swing away  [x]flip back   []reclining [x]full length pads []desk    []pads tubular  [x]provide support with elbow at 90   []provide support for w/c tray [x]change of height/angles for variable activities [x]remove for transfers [x]allow to come closer to table top [x]remove for access to tables [] ?????  Hangers/ Leg rests  []60 []70 []90 []elevating []heavy duty  []articulating []fixed []lift off []swing away     []power []provide LE support  []accommodate to hamstring tightness []elevate legs during recline   []  provide change in position for Legs []Maintain placement of feet on footplate []durability []enable transfers []decrease edema []Accommodate lower leg length [] ?????  Foot support Footplate    []Lt  [] Rt  [x] Center mount [x]flip up     [x]depth/angle adjustable []Amputee adapter    [] Lt     [] Rt [x]provide foot support [x]accommodate to ankle ROM [x]transfers []Provide support for residual extremity [] allow foot to go under wheelchair base [] decrease tone  [] ?????  [] Ankle strap/heel loops []support foot on foot support []decrease extraneous movement []provide input to heel  []protect foot  Tires: [x]pneumatic  [x]flat free inserts  []solid  [x]decrease maintenance  [x]prevent frequent flats []increase shock absorbency []decrease pain from road shock []decrease spasms from road shock [] ?????  [x] Headrest  [x]provide posterior head support []provide posterior neck support []provide lateral head support []provide anterior head support [x]support during tilt and recline []improve feeding   []improve  respiration []placement of switches []safety  []accommodate ROM  []accommodate tone []improve visual orientation  [] Anterior chest strap [] Vest [] Shoulder retractors  []decrease forward movement of shoulder []accommodation of TLSO []decrease forward movement of trunk []decrease shoulder elevation []added abdominal support []alignment []assistance with shoulder control  [] ?????  Pelvic Positioner [x]Belt []SubASIS bar []Dual Pull []stabilize tone [x]decrease falling out of chair/ **will not Decr potential for sliding due to pelvic tilting []prevent excessive rotation []pad for protection over boney prominence []prominence comfort []special pull angle to control rotation [] ?????  Upper Extremity Support []L   [] R []Arm trough    []hand support [] tray       []full tray []swivel mount []decrease edema      []decrease subluxation   []control tone   []placement for AAC/Computer/EADL []decrease gravitational pull on shoulders []provide midline positioning []provide support to increase UE function []provide hand support in natural position []provide work surface   POWER WHEELCHAIR CONTROLS  [x]Proportional  []Non-Proportional Type Joystick []Left  [x]Right [x]provides access for controlling wheelchair   []lacks motor control to operate proportional drive control []unable to understand proportional controls  Actuator Control Module  [x]Single  []Multiple   [x]Allow the client to operate the power seat function(s) through the joystick control   []Safety Reset Switches []Used to change modes and stop the wheelchair when driving in latch mode    [x]Upgraded Electronics   [x]programming for accurate control []progressive Disease/changing condition []non-proportional drive control needed [x]Needed in order to operate power seat functions through joystick control   []Display box []Allows user to see in which mode and drive the wheelchair is set  []necessary for alternate  controls    []Digital interface electronics []Allows w/c to operate when using alternative drive controls  []ASL Head Array []Allows client to operate wheelchair  through switches placed in tri-panel headrest  []Sip and puff with tubing kit []needed to operate sip and puff drive controls  []Upgraded tracking electronics []increase safety when driving []correct tracking when on uneven surfaces  [x]Mount for switches or joystick [x]Attaches switches to w/c  [x]Swing away for access or transfers []midline for optimal placement []provides for consistent access  []Attendant controlled joystick plus mount []safety []long distance driving []operation of seat functions []compliance with transportation regulations [] ?????    Rear wheel placement/Axle adjustability []None []semi adjustable []fully adjustable  []improved UE access to wheels []improved stability []changing angle in space for improvement of postural stability []1-arm drive access []amputee pad placement [] ?????    Wheel rims/ hand rims  []metal  []plastic coated []oblique projections []vertical projections []Provide ability to propel manual wheelchair  [] Increase self-propulsion with hand weakness/decreased grasp  Push handles []extended  []angle adjustable  []standard []caregiver access []caregiver assist []allows "hooking" to enable increased ability to perform ADLs or maintain balance  One armed device  []Lt   []Rt []enable propulsion of manual wheelchair with one arm   [] ?????   Brake/wheel lock extension [] Lt   [] Rt []increase indep in applying wheel locks   []Side guards []prevent clothing getting caught in wheel or becoming soiled [] prevent skin tears/abrasions  Battery: NF 22 x 2 [x]to power wheelchair ?????  Other: ????? ????? ?????  The above equipment has a life- long use expectancy. Growth and changes in medical and/or functional conditions would be the exceptions. This is to certify that the therapist has no  financial relationship with durable medical provider or manufacturer. The therapist will not receive remuneration of any kind for the equipment recommended in this evaluation.   Patient has mobility limitation that significantly impairs safe, timely participation in one or more mobility related ADL's.  (bathing, toileting, feeding, dressing, grooming, moving from room to room)                                                             [x] Yes [] No Will mobility device sufficiently improve ability to participate and/or be aided in participation of MRADL's?         [x] Yes [] No Can limitation be compensated for with use of a cane or walker?                                                                                [] Yes [x] No Does patient or caregiver demonstrate ability/potential ability & willingness to safely use the mobility device?   [x] Yes [] No Does patient's home environment support use of recommended mobility device?                                                    [x] Yes [] No Does patient have sufficient upper extremity function necessary to functionally propel a manual wheelchair?    [] Yes [x] No Does patient have sufficient strength and trunk stability to safely operate a POV (scooter)?                                  [] Yes [x] No Does patient need additional features/benefits provided by a power wheelchair for MRADL's in the home?       [x] Yes [] No Does the patient demonstrate the ability to safely use a power wheelchair?                                                              [  x] Yes [] No  Therapist Name Printed:  F , PT Date: 12/02/2019  Therapist's Signature:   Date:   Supplier's Name Printed: Joshua Cadle, [employer] Date: 12/02/2019  Supplier's Signature:   Date:  Patient/Caregiver Signature:   Date:     This is to certify that I have read this evaluation and do agree with the content within:      Physician's Name Printed: Edwin Avbuere, MD   Physician's Signature:  Date:     This is to certify that I, the above signed therapist have the following affiliations: [] This DME provider [] Manufacturer of recommended equipment [] Patient's long term care facility [x] None of the above       Objective measurements completed on examination: See above findings.              PT Education - 12/05/19 2002    Education Details  process for obtaining power wheelchair    Person(s) Educated  Patient;Caregiver(s)    Methods  Explanation    Comprehension  Verbalized understanding                  Plan - 12/05/19 2041    Clinical Impression Statement  Pt is a 58 year old female referred to [facility] for evaluation for power mobility.  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  Past surgical history includes L heart catheterization with coronary angiogram, femoral endarterectomy, breast lumpectomy with radioactive seed localization, and R external iliac artery stent. The following deficits were noted during pt's exam: impaired sensation, slow healing wound on L residual limb, impaired UE and LE strength, impaired postural control, impaired balance, and inability to ambulate and history of multiple falls. Pt would benefit from power mobility with specialized seating to maximize functional mobility independence and reduce falls risk.    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  Past surgical history includes L heart catheterization with coronary angiogram, femoral endarterectomy, breast lumpectomy with radioactive seed localization, and R external iliac artery stent    Examination-Activity Limitations  Bathing;Dressing;Hygiene/Grooming;Locomotion Level;Stand;Toileting;Transfers    Examination-Participation Restrictions  Community Activity;Meal Prep    Stability/Clinical Decision Making  Evolving/Moderate complexity    Clinical Decision Making  Moderate    PT Frequency  One time visit    PT Duration  Other (comment)   one time visit for power w/c eval only   PT Treatment/Interventions  Other (comment)   wheelchair management   Consulted and Agree with Plan of Care  Patient;Family member/caregiver       Patient will benefit from skilled therapeutic intervention in order to improve the following deficits and impairments:  Cardiopulmonary status limiting activity, Decreased activity tolerance, Decreased balance, Decreased endurance, Decreased mobility, Decreased range of motion, Decreased strength, Impaired sensation, Impaired UE functional use, Postural dysfunction, Pain  Visit Diagnosis: Repeated falls  Other disturbances of skin sensation  Pain in left leg  Muscle weakness (generalized)     Problem List Patient   Active Problem List   Diagnosis Date Noted  . 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 Chronic combined systolic and diastolic CHF (congestive heart failure) (HCC) 03/29/2019  . Gangrene of left foot (HCC) 02/14/2019  . Loss of consciousness (HCC) 02/15/2018  . Asthma 10/07/2016  . Colonic diverticular abscess   . Anxiety 08/28/2014  . Breast pain, left 08/11/2014  . Abnormal ECG 01/28/2014  . Health care maintenance 09/16/2013  . Metrorrhagia 10/12/2012  . Endometrial mass 10/12/2012  . Mixed stress and urge urinary incontinence  07/09/2012  . Osteoarthritis of both knees 02/15/2012  . Chronic pain syndrome 07/05/2010  . Venous thromboembolism 02/22/2010  . Depression with anxiety 07/17/2009  . 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 TOBACCO ABUSE 10/20/2006  . Essential hypertension 10/20/2006  . Peripheral vascular disease (HCC) 10/20/2006  . HYPERLIPIDEMIA, MIXED 10/10/2006     F , PT, DPT 12/05/19    9:22 PM    [facility] [address] [place], [place], [address] Phone: [phone]   Fax:  [phone]  Name: Alisha [name] MRN: [id number] Date of Birth: 09/18/1961  

## 2019-12-22 ENCOUNTER — Telehealth: Payer: Self-pay | Admitting: Vascular Surgery

## 2019-12-22 NOTE — Telephone Encounter (Signed)
Alisha [name] with [facility] called.  Pt is having Left AKA pain and is swollen (2+ pitting edema).  There is a small area of drainage from the amp site that is clear in color.  She wants an appt with Dr. Oneida Alar.  Alisha [name] was advised that there are not any other openings before the weekend and She will need to go to Urgent Care for pain and swelling.  I called patient and left message telling her this as well and to call back to make an appt with Dr. Nona Dell at his next available appointment slot.  Thurston Hole., LPN

## 2020-01-07 ENCOUNTER — Other Ambulatory Visit: Payer: Self-pay | Admitting: Cardiovascular Disease

## 2020-01-11 ENCOUNTER — Other Ambulatory Visit: Payer: Self-pay

## 2020-01-11 NOTE — Progress Notes (Signed)
Spoke to Raymond from [facility] who is with pt. Pt's s/p left AKA which has opened up and is edematous. Pain level is 10/10. Pt refusing to go to ED. Offered her a morning appt with [name] tomorrow, pt declined it due to lack of transportation and opted to take next Tuesday appt. Pt will continue to be seen by [facility].

## 2020-01-12 ENCOUNTER — Ambulatory Visit: Payer: Medicare Other | Admitting: Vascular Surgery

## 2020-01-17 ENCOUNTER — Telehealth (HOSPITAL_COMMUNITY): Payer: Self-pay

## 2020-01-17 NOTE — Telephone Encounter (Signed)

## 2020-01-17 NOTE — Progress Notes (Signed)
HISTORY AND PHYSICAL     CC:  follow up Requesting Provider:  Nolene Ebbs, MD  HPI: Alisha [name] is a 59 y.o. (1961-04-20) female who has hx of left AKA by Dr. Scot Dock in February 2020 with subsequent debridement of poorly healing left AKA on 03/29/2019 by Dr. Oneida Alar and excisional debridement of left AKA ((skin, subcutaneous tissue, and fascia) & Placement of VAC on 04/02/2019 by Dr. Scot Dock.  She was last seen in September 2020 and at that time, Annitta Needs was ordered for debridement of the wound to help promote healing and she was to f/u in 2-3 weeks for wound check.  She called in December for c/o pain and swelling with small area of drainage from amp site that was clear.  She wanted appt with Dr. Oneida Alar but there were no openings before the weekend and advised to go to Urgent Care for pain and swelling.  She called on 01/11/2020 with c/o her left AKA opening up and swollen with pain.  Pt refused to go to the ED and was offerend an appt but wanted the next Tuesday appt.    She is here today for evaluation.  She states that she has pain in her stump underneath the incision and it feels tight. She says that it has been draining.  She states that her nurses aid told her there is small pin point area it is draining from.  She does not feel bad and she has not had any fevers.  She tells me she had a covid test and it was negative.   The pt is on a statin for cholesterol management.  The pt is on a daily aspirin.   Other AC:  Xarelto The pt is on BB, ARB for hypertension.   The pt is not diabetic.   Tobacco hx:  current  Past Medical History:  Diagnosis Date  . Anxiety   . Asthma   . Bipolar disorder ([place])   . Breast lump 02/2008   Biopsy 05/2008: showed no evidence of malignancy  . Breast mass in female    bi lat   . CHF (congestive heart failure) ([place])   . Chronic pain syndrome   . Degenerative joint disease of knee   . Dental caries   . Depression   . DVT (deep venous thrombosis)  (HCC)    bilateral, 2 episodes: Requires lifelong therapy  . Endometrial mass 10/12/2012   Endometrium, biopsy on 11/04/12 - PROLIFERATIVE ENDOMETRIUM AND ABUNDANT MUCUS. NO HYPERPLASIA OR CARCINOMA.   . Fibromyalgia   . Hyperlipidemia   . Hypertension   . Irregular menses 9/08  . Joint pain   . Mixed stress and urge urinary incontinence    Being followed by [facility], underwent cystoscopy & uroflowmetry   . Pulmonary edema   . PVD (peripheral vascular disease) ([place])    s/p L fem-pop bypass 11/21/08; graft occluded 12/28/08;  aortogram w/ bilat LE runoff: 1.  bilat diffuse SFA occlusive dz, 2.  Mod to severe above-knee popliteal dz,  3.  Bilat 3-vessel runoff w/ mild tibial occlusive dz.  . Restless leg syndrome   . Sigmoid diverticulitis 10/26/2012  . Tobacco abuse   . Wears glasses     Past Surgical History:  Procedure Laterality Date  .  Right external iliac artery stent  11/04/2008  . ABDOMINAL AORTAGRAM     11/04/2008; 10/14/2006  . AMPUTATION Left 02/16/2019   Procedure: AMPUTATION ABOVE KNEE;  Surgeon: Angelia Mould, MD;  Location: [facility]  OR;  Service: Vascular;  Laterality: Left;  . AMPUTATION Left 04/02/2019   Procedure: IRRIGATION AND DEBRIDEMENT LEFT ABOVE KNEE AMPUTATION;  Surgeon: Angelia Mould, MD;  Location: [place];  Service: Vascular;  Laterality: Left;  . APPLICATION OF WOUND VAC Left 04/02/2019   Procedure: APPLICATION OF WOUND VAC TO LEFT KNEE AMPUTATION;  Surgeon: Angelia Mould, MD;  Location: [place];  Service: Vascular;  Laterality: Left;  . BREAST LUMPECTOMY W/ NEEDLE LOCALIZATION Right 06/13/2008  . BREAST LUMPECTOMY WITH RADIOACTIVE SEED LOCALIZATION Bilateral 05/03/2014   Procedure: BREAST LUMPECTOMY WITH RADIOACTIVE SEED LOCALIZATION;  Surgeon: Joyice Faster. [place], MD;  Location: [place];  Service: General;  Laterality: Bilateral;  . COLONOSCOPY WITH PROPOFOL  01/25/2014  . FEMORAL ENDARTERECTOMY Left 11/21/2008  .  FEMORAL-POPLITEAL BYPASS GRAFT Left 11/21/2008  . I & D EXTREMITY Left 03/29/2019   Procedure: IRRIGATION AND DEBRIDEMENT LEFT ABOVE KNEE AMPUTATION;  Surgeon: Elam Dutch, MD;  Location: [place];  Service: Vascular;  Laterality: Left;  . LEFT HEART CATHETERIZATION WITH CORONARY ANGIOGRAM N/A 02/25/2014   Procedure: LEFT HEART CATHETERIZATION WITH CORONARY ANGIOGRAM;  Surgeon: Peter M Martinique, MD;  Location: [facility] CATH LAB;  Service: Cardiovascular;  Laterality: N/A;  . TONSILLECTOMY    . TUBAL LIGATION      Social History   Socioeconomic History  . Marital status: Divorced    Spouse name: Not on file  . Number of children: 4  . Years of education: Not on file  . Highest education level: Not on file  Occupational History  . Occupation: disable    Employer: UNEMPLOYED  Tobacco Use  . Smoking status: Current Every Day Smoker    Packs/day: 0.50    Years: 18.00    Pack years: 9.00    Types: Cigarettes  . Smokeless tobacco: Never Used  . Tobacco comment: 1pk 1/2 PACK DAY 2-3 0r none  Substance and Sexual Activity  . Alcohol use: Not Currently    Alcohol/week: 0.0 standard drinks  . Drug use: No    Comment: hx of marijuana and cocaine use; uses Suboxone patch  . Sexual activity: Never  Other Topics Concern  . Not on file  Social History Narrative   Financial assistance application initiated. Patient needs to submit further paperwork to complete   [facility]  September 11, 2010 2:04 PM   Financial assistance approved for 100% discount at [facility] and has [employer] card   [name]  October 04, 2010 5:29 PM   Social Determinants of Health   Financial Resource Strain:   . Difficulty of Paying Living Expenses: Not on file  Food Insecurity:   . Worried About Charity fundraiser in the Last Year: Not on file  . Ran Out of Food in the Last Year: Not on file  Transportation Needs:   . Lack of Transportation (Medical): Not on file  . Lack of Transportation (Non-Medical): Not on file    Physical Activity:   . Days of Exercise per Week: Not on file  . Minutes of Exercise per Session: Not on file  Stress:   . Feeling of Stress : Not on file  Social Connections:   . Frequency of Communication with Friends and Family: Not on file  . Frequency of Social Gatherings with Friends and Family: Not on file  . Attends Religious Services: Not on file  . Active Member of Clubs or Organizations: Not on file  . Attends Archivist Meetings: Not on  file  . Marital Status: Not on file  Intimate Partner Violence:   . Fear of Current or Ex-Partner: Not on file  . Emotionally Abused: Not on file  . Physically Abused: Not on file  . Sexually Abused: Not on file    Family History  Problem Relation Age of Onset  . Breast cancer Mother   . Depression Mother   . Hyperlipidemia Mother   . Mental retardation Mother   . Hyperlipidemia Father   . Mental retardation Father   . Breast cancer Other        GREAT AUNT  . Depression Sister   . Hyperlipidemia Sister   . Mental retardation Sister   . Mental retardation Daughter     Current Outpatient Medications  Medication Sig Dispense Refill  . albuterol (VENTOLIN HFA) 108 (90 Base) MCG/ACT inhaler Inhale 1-2 puffs into the lungs every 6 (six) hours as needed for wheezing or shortness of breath. 1 Inhaler 2  . aspirin EC 81 MG tablet Take 81 mg by mouth daily.    Marland Kitchen atorvastatin (LIPITOR) 40 MG tablet Take 1 tablet (40 mg total) by mouth at bedtime. (Patient taking differently: Take 40 mg by mouth daily. ) 90 tablet 3  . carvedilol (COREG) 12.5 MG tablet Take 0.5 tablets (6.25 mg total) by mouth 2 (two) times daily with a meal. 180 tablet 1  . collagenase (SANTYL) ointment Apply 1 application topically daily. Apply to left stump daily with dressing changes. 30 g 1  . diazepam (VALIUM) 2 MG tablet Take 1 tablet (2 mg total) by mouth every 12 (twelve) hours as needed for up to 4 doses for anxiety. 4 tablet 0  . DULoxetine (CYMBALTA)  60 MG capsule Take 60 mg by mouth 2 (two) times daily.     Marland Kitchen escitalopram (LEXAPRO) 10 MG tablet Take 10 mg by mouth daily.    Marland Kitchen gabapentin (NEURONTIN) 800 MG tablet Take 800-1,600 mg by mouth See admin instructions. 800 mg twice daily, and 1600 mg at bedtime    . hydrochlorothiazide (HYDRODIURIL) 25 MG tablet Take 0.5 tablets (12.5 mg total) by mouth daily. 90 tablet 1  . hydrOXYzine (ATARAX/VISTARIL) 50 MG tablet Take 50 mg by mouth See admin instructions. Take 50 mg by mouth at bedtime and an additional 50 mg two times a day as needed for anxiety    . losartan (COZAAR) 25 MG tablet Take 1 tablet (25 mg total) by mouth daily. 90 tablet 3  . Lurasidone HCl (LATUDA) 120 MG TABS Take 80 mg by mouth 2 (two) times daily.     . Multiple Vitamins-Minerals (MULTIVITAMIN WITH MINERALS) tablet Take 1 tablet by mouth daily.    . nicotine (NICODERM CQ - DOSED IN MG/24 HR) 7 mg/24hr patch Place 1 patch (7 mg total) onto the skin daily as needed (nicotine replacement). 28 patch 0  . polyethylene glycol (MIRALAX / GLYCOLAX) 17 g packet Take 17 g by mouth daily as needed for mild constipation. 14 each 0  . vitamin E (VITAMIN E) 400 UNIT capsule Take 800 Units by mouth 2 (two) times daily.    Alveda Reasons 10 MG TABS tablet Take 10 mg by mouth daily.  5   No current facility-administered medications for this visit.    Allergies  Allergen Reactions  . Propoxyphene N-Acetaminophen Anaphylaxis and Swelling  . Strawberry Extract Anaphylaxis and Shortness Of Breath    "I go into shock"  . Buspar [Buspirone] Swelling    "Swells my face"  .  Grapefruit Extract Other (See Comments)    Interaction with medications she's taking  . Tape Itching    Patient says that it itches and swells with some adhesive dressings.     REVIEW OF SYSTEMS:  See HPI otherwise negative. [X]  denotes positive finding, [ ]  denotes negative finding Cardiac  Comments:  Chest pain or chest pressure:    Shortness of breath upon exertion:     Short of breath when lying flat:    Irregular heart rhythm:        Vascular    Pain in calf, thigh, or hip brought on by ambulation:    Pain in feet at night that wakes you up from your sleep:     Blood clot in your veins:    Leg swelling:         Pulmonary    Oxygen at home:    Productive cough:     Wheezing:         Neurologic    Sudden weakness in arms or legs:     Sudden numbness in arms or legs:     Sudden onset of difficulty speaking or slurred speech:    Temporary loss of vision in one eye:     Problems with dizziness:         Gastrointestinal    Blood in stool:     Vomited blood:         Genitourinary    Burning when urinating:     Blood in urine:        Psychiatric    Major depression:         Hematologic    Bleeding problems:    Problems with blood clotting too easily:        Skin    Rashes or ulcers:        Constitutional    Fever or chills:      PHYSICAL EXAMINATION:  Today's Vitals   01/18/20 1010  BP: (!) 123/92  Pulse: 82  Resp: 18  Temp: (!) 97.2 F (36.2 C)  TempSrc: Temporal  SpO2: 96%  Weight: 215 lb (97.5 kg)  Height: 5\' 4"  (1.626 m)  PainSc: 9    Body mass index is 36.9 kg/m.   General:  WDWN in NAD in wheelchair; vital signs documented above Gait: Not observed HENT: WNL, normocephalic Pulmonary: normal non-labored breathing , without Rales, rhonchi,  wheezing Abdomen: soft, NT, no masses Skin: without rashes Extremities: left AKA stump with some purulent drainage from pinpoint hole in middle of incision.   Incision probed with cotton tip applicator and was not able to penetrate incision.   Musculoskeletal: no muscle wasting or atrophy  Neurologic: A&O X 3;  No focal weakness or paresthesias are detected Psychiatric:  The pt has Normal affect.   Non-Invasive Vascular Imaging:   None today    ASSESSMENT/PLAN:: 59 y.o. female here for follow up for left AKA and subsequent wound healing issues   -pt seen with  Dr. Donnetta Hutching.  It was felt that given she does not have any fevers and doesn't feel bad, we will try Keflex 500mg  tid x 2 weeks and have her return in 2 weeks for wound check with Dr. Oneida Alar.  Discussed with her that she may need surgery to drain if abx does not help.  She is in agreement with trying abx.   -Keflex 500mg  tid x 2 weeks #42 sent to [facility] on [address].     [place]  Claudean Kinds, PA-C [facility] [phone]  Clinic MD:   Pt seen and examined with Dr. Donnetta Hutching

## 2020-01-18 ENCOUNTER — Ambulatory Visit (INDEPENDENT_AMBULATORY_CARE_PROVIDER_SITE_OTHER): Payer: Medicare Other | Admitting: Physician Assistant

## 2020-01-18 ENCOUNTER — Other Ambulatory Visit: Payer: Self-pay

## 2020-01-18 VITALS — BP 123/92 | HR 82 | Temp 97.2°F | Resp 18 | Ht 64.0 in | Wt 215.0 lb

## 2020-01-18 DIAGNOSIS — T8744 Infection of amputation stump, left lower extremity: Secondary | ICD-10-CM

## 2020-01-18 MED ORDER — CEPHALEXIN 500 MG PO CAPS: 500.0000 mg | ORAL_CAPSULE | Freq: Three times a day (TID) | ORAL | 0 refills | Status: DC

## 2020-02-02 ENCOUNTER — Telehealth (HOSPITAL_COMMUNITY): Payer: Self-pay

## 2020-02-02 NOTE — Telephone Encounter (Signed)

## 2020-02-03 ENCOUNTER — Encounter: Payer: Self-pay | Admitting: Surgery

## 2020-02-03 ENCOUNTER — Ambulatory Visit: Payer: Medicare Other | Admitting: Vascular Surgery

## 2020-02-11 ENCOUNTER — Telehealth: Payer: Self-pay

## 2020-02-11 NOTE — Telephone Encounter (Signed)
Kathlee Nations with [facility] called and said that she thought that the patient should be seen. She said that she has warmth and drainage at her incision site.   Left several messages with Kathlee Nations and unable to reach her. Last message was to gave patient an appt for next week. Asked her to call back if this did not work. \  York Cerise, CMA

## 2020-02-14 ENCOUNTER — Ambulatory Visit: Payer: Medicare Other

## 2020-02-15 NOTE — Progress Notes (Deleted)
[facility] [place] HISTORY AND PHYSICAL   History of Present Illness:  Patient is a 59 y.o. year old female who presents for evaluation of her left AKA with abnormally slow healing.  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 Oneida Alar and excisional debridement of left AKA ((skin, subcutaneous tissue, and fascia) & Placement of VAC on 04/02/2019 by Dr. Scot Dock.  She was last seen in September 2020 and at that time, Annitta Needs was ordered for debridement of the wound to help promote healing and she was to f/u in 2-3 weeks for wound check.  She called in December for c/o pain and swelling with small area of drainage from amp site that was clear.   She was last seen in clinic on 01/18/20 by [name] PA-C.  pt seen with Dr. Donnetta Hutching.  It was felt that given she does not have any fevers and doesn't feel bad, we will try Keflex 500mg  tid x 2 weeks and have her return in 2 weeks for wound check with Dr. Oneida Alar.  Discussed with her that she may need surgery to drain if abx does not help.  She is in agreement with trying abx.   -Keflex 500mg  tid x 2 weeks #42 sent to [facility] on [address].   Her [facility] RN called and reported continued warmth and drainage from th left AKA incision.     Past Medical History:  Diagnosis Date  . Anxiety   . Asthma   . Bipolar disorder ([place])   . Breast lump 02/2008   Biopsy 05/2008: showed no evidence of malignancy  . Breast mass in female    bi lat   . CHF (congestive heart failure) ([place])   . Chronic pain syndrome   . Degenerative joint disease of knee   . Dental caries   . Depression   . 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 Endometrial mass 10/12/2012   Endometrium, biopsy on 11/04/12 - PROLIFERATIVE ENDOMETRIUM AND ABUNDANT MUCUS. NO HYPERPLASIA OR CARCINOMA.   . Fibromyalgia   . Hyperlipidemia   .  Hypertension   . Irregular menses 9/08  . Joint pain   . Mixed stress and urge urinary incontinence    [facility], underwent cystoscopy & uroflowmetry   . Pulmonary edema   . PVD (peripheral vascular disease) ([place])    s/p L fem-pop bypass 11/21/08; graft occluded 12/28/08;  aortogram w/ bilat LE runoff: 1.  bilat diffuse SFA occlusive dz, 2.  Mod to severe above-knee popliteal dz,  3.  Bilat 3-vessel runoff w/ mild tibial occlusive dz.  . Restless leg syndrome   . Sigmoid diverticulitis 10/26/2012  . Tobacco abuse   . Wears glasses     Past Surgical History:  Procedure Laterality Date  .  Right external iliac artery stent  11/04/2008  . ABDOMINAL AORTAGRAM     11/04/2008; 10/14/2006  . AMPUTATION Left 02/16/2019   Procedure: AMPUTATION ABOVE KNEE;  Surgeon: Angelia Mould, MD;  Location: [facility];  Service: Vascular;  Laterality: Left;  . AMPUTATION Left 04/02/2019   Procedure: IRRIGATION AND DEBRIDEMENT LEFT ABOVE KNEE AMPUTATION;  Surgeon: Angelia Mould, MD;  Location: [facility];  Service: Vascular;  Laterality: Left;  . APPLICATION OF WOUND VAC Left 04/02/2019   Procedure: APPLICATION OF WOUND VAC TO LEFT KNEE AMPUTATION;  Surgeon: Scot Dock,  Judeth Cornfield, MD;  Location: [facility];  Service: Vascular;  Laterality: Left;  . BREAST LUMPECTOMY W/ NEEDLE LOCALIZATION Right 06/13/2008  . BREAST LUMPECTOMY WITH RADIOACTIVE SEED LOCALIZATION Bilateral 05/03/2014   Procedure: BREAST LUMPECTOMY WITH RADIOACTIVE SEED LOCALIZATION;  Surgeon: Joyice Faster. Cornett, MD;  Location: [facility];  Service: General;  Laterality: Bilateral;  . COLONOSCOPY WITH PROPOFOL  01/25/2014  . FEMORAL ENDARTERECTOMY Left 11/21/2008  . FEMORAL-POPLITEAL BYPASS GRAFT Left 11/21/2008  . I & D EXTREMITY Left 03/29/2019   Procedure: IRRIGATION AND DEBRIDEMENT LEFT ABOVE KNEE AMPUTATION;  Surgeon: Elam Dutch, MD;  Location: [facility];  Service: Vascular;  Laterality: Left;  . LEFT  HEART CATHETERIZATION WITH CORONARY ANGIOGRAM N/A 02/25/2014   Procedure: LEFT HEART CATHETERIZATION WITH CORONARY ANGIOGRAM;  Surgeon: Peter M Martinique, MD;  Location: [facility] C[facility];  Service: Cardiovascular;  Laterality: N/A;  . TONSILLECTOMY    . TUBAL LIGATION      ROS:   General:  No weight loss, Fever, chills  HEENT: No recent headaches, no nasal bleeding, no visual changes, no sore throat  Neurologic: No dizziness, blackouts, seizures. No recent symptoms of stroke or mini- stroke. No recent episodes of slurred speech, or temporary blindness.  Cardiac: No recent episodes of chest pain/pressure, no shortness of breath at rest.  No shortness of breath with exertion.  Denies history of atrial fibrillation or irregular heartbeat  Vascular: No history of rest pain in feet.  No history of claudication.  No history of non-healing ulcer, No history of DVT   Pulmonary: No home oxygen, no productive cough, no hemoptysis,  No asthma or wheezing  Musculoskeletal:  [ ]  Arthritis, [ ]  Low back pain,  [ ]  Joint pain  Hematologic:No history of hypercoagulable state.  No history of easy bleeding.  No history of anemia  Gastrointestinal: No hematochezia or melena,  No gastroesophageal reflux, no trouble swallowing  Urinary: [ ]  chronic Kidney disease, [ ]  on HD - [ ]  MWF or [ ]  TTHS, [ ]  Burning with urination, [ ]  Frequent urination, [ ]  Difficulty urinating;   Skin: No rashes  Psychological: No history of anxiety,  No history of depression  Social History Social History   Tobacco Use  . Smoking status: Current Every Day Smoker    Packs/day: 0.50    Years: 18.00    Pack years: 9.00    Types: Cigarettes  . Smokeless tobacco: Never Used  . Tobacco comment: 1pk 1/2 PACK DAY 2-3 0r none  Substance Use Topics  . Alcohol use: Not Currently    Alcohol/week: 0.0 standard drinks  . Drug use: No    Comment: hx of marijuana and cocaine use; uses Suboxone patch    Family History Family History   Problem Relation Age of Onset  . Breast cancer Mother   . Depression Mother   . Hyperlipidemia Mother   . Mental retardation Mother   . Hyperlipidemia Father   . Mental retardation Father   . Breast cancer Other        GREAT AUNT  . Depression Sister   . Hyperlipidemia Sister   . Mental retardation Sister   . Mental retardation Daughter     Allergies  Allergies  Allergen Reactions  . Propoxyphene N-Acetaminophen Anaphylaxis and Swelling  . Strawberry Extract Anaphylaxis and Shortness Of Breath    "I go into shock"  . Buspar [Buspirone] Swelling    "Swells my face"  . Grapefruit Extract Other (See Comments)  Interaction with medications she's taking  . Tape Itching    Patient says that it itches and swells with some adhesive dressings.     Current Outpatient Medications  Medication Sig Dispense Refill  . albuterol (VENTOLIN HFA) 108 (90 Base) MCG/ACT inhaler Inhale 1-2 puffs into the lungs every 6 (six) hours as needed for wheezing or shortness of breath. 1 Inhaler 2  . aspirin EC 81 MG tablet Take 81 mg by mouth daily.    Marland Kitchen atorvastatin (LIPITOR) 40 MG tablet Take 1 tablet (40 mg total) by mouth at bedtime. (Patient taking differently: Take 40 mg by mouth daily. ) 90 tablet 3  . carvedilol (COREG) 12.5 MG tablet Take 0.5 tablets (6.25 mg total) by mouth 2 (two) times daily with a meal. 180 tablet 1  . cephALEXin (KEFLEX) 500 MG capsule Take 1 capsule (500 mg total) by mouth 3 (three) times daily. 42 capsule 0  . collagenase (SANTYL) ointment Apply 1 application topically daily. Apply to left stump daily with dressing changes. 30 g 1  . diazepam (VALIUM) 2 MG tablet Take 1 tablet (2 mg total) by mouth every 12 (twelve) hours as needed for up to 4 doses for anxiety. 4 tablet 0  . DULoxetine (CYMBALTA) 60 MG capsule Take 60 mg by mouth 2 (two) times daily.     Marland Kitchen escitalopram (LEXAPRO) 10 MG tablet Take 10 mg by mouth daily.    Marland Kitchen gabapentin (NEURONTIN) 800 MG tablet Take  800-1,600 mg by mouth See admin instructions. 800 mg twice daily, and 1600 mg at bedtime    . hydrochlorothiazide (HYDRODIURIL) 25 MG tablet Take 0.5 tablets (12.5 mg total) by mouth daily. 90 tablet 1  . hydrOXYzine (ATARAX/VISTARIL) 50 MG tablet Take 50 mg by mouth See admin instructions. Take 50 mg by mouth at bedtime and an additional 50 mg two times a day as needed for anxiety    . losartan (COZAAR) 25 MG tablet Take 1 tablet (25 mg total) by mouth daily. 90 tablet 3  . Lurasidone HCl (LATUDA) 120 MG TABS Take 80 mg by mouth 2 (two) times daily.     . Multiple Vitamins-Minerals (MULTIVITAMIN WITH MINERALS) tablet Take 1 tablet by mouth daily.    . nicotine (NICODERM CQ - DOSED IN MG/24 HR) 7 mg/24hr patch Place 1 patch (7 mg total) onto the skin daily as needed (nicotine replacement). 28 patch 0  . polyethylene glycol (MIRALAX / GLYCOLAX) 17 g packet Take 17 g by mouth daily as needed for mild constipation. 14 each 0  . vitamin E (VITAMIN E) 400 UNIT capsule Take 800 Units by mouth 2 (two) times daily.    Alveda Reasons 10 MG TABS tablet Take 10 mg by mouth daily.  5   No current facility-administered medications for this visit.    Physical Examination  There were no vitals filed for this visit.  There is no height or weight on file to calculate BMI.  General:  Alert and oriented, no acute distress HEENT: Normal Neck: No bruit or JVD Pulmonary: Clear to auscultation bilaterally Cardiac: Regular Rate and Rhythm without murmur Abdomen: Soft, non-tender, non-distended, no mass, no scars Skin: No rash Extremity Pulses:  2+ radial, brachial, femoral, dorsalis pedis, posterior tibial pulses bilaterally Musculoskeletal: No deformity or edema  Neurologic: Upper and lower extremity motor 5/5 and symmetric  DATA: ***   ASSESSMENT: ***   PLAN: ***   Ruta Hinds, MD [facility] [place] Office: [phone] Pager: [phone]

## 2020-02-16 ENCOUNTER — Telehealth (HOSPITAL_COMMUNITY): Payer: Self-pay

## 2020-02-16 NOTE — Telephone Encounter (Signed)

## 2020-02-17 ENCOUNTER — Ambulatory Visit: Payer: Medicare Other

## 2020-03-28 ENCOUNTER — Ambulatory Visit: Payer: Medicare Other

## 2020-04-12 ENCOUNTER — Telehealth: Payer: Self-pay | Admitting: *Deleted

## 2020-04-12 NOTE — Telephone Encounter (Signed)
patient called and stated wound care center told her to follow up here for wound check patient states it is getting worse . Told patient someone from our office will call to schedule her an appt.

## 2020-04-18 ENCOUNTER — Ambulatory Visit: Payer: Medicare Other

## 2020-04-26 ENCOUNTER — Other Ambulatory Visit: Payer: Self-pay

## 2020-04-26 ENCOUNTER — Ambulatory Visit (INDEPENDENT_AMBULATORY_CARE_PROVIDER_SITE_OTHER): Payer: Medicare Other | Admitting: Physician Assistant

## 2020-04-26 VITALS — BP 105/66 | HR 74 | Temp 97.7°F | Resp 16 | Ht 64.5 in | Wt 220.0 lb

## 2020-04-26 DIAGNOSIS — I739 Peripheral vascular disease, unspecified: Secondary | ICD-10-CM | POA: Diagnosis not present

## 2020-04-26 DIAGNOSIS — T8744 Infection of amputation stump, left lower extremity: Secondary | ICD-10-CM

## 2020-04-26 NOTE — H&P (View-Only) (Signed)
Office Note     CC:  follow up Requesting Provider:  Nolene Ebbs, MD  HPI: Alisha [name] is a 58 y.o. (1961-03-09) female who presents for follow up on non healing left above knee amputation. She has hx of left AKA by Dr. Scot Dock in February of 2020 with subsequent debridement of poorly healing left AKA on 03/29/2019 by Dr. Oneida Alar and excisional debridement of left AKA & Placement of VAC on 04/02/2019 by Dr. Scot Dock.  She was last seen on 01/18/20. At that time she was having pain in the stump and drainage from central pore. She was not having any fevers or chills at the time. She was given 14 day prescription of Keflex to see if this would resolve her infection. She was instructed to come back after completion of this course of antibiotics but has not followed up since.  She returns to day with continued pain at the left amputation stump. Very sensitive to any manipulation of her AKA site. She continues to have "brown and bloody" drainage from a small central pore. She says her home care nurses have been placing a dressing daily and sometimes this becomes saturated and has to be changed more than once a day. She does state that she completed the course of antibiotics and there was several days where it seemed to be improved but then drainage returned. She denies any fever or chills or feeling ill.   The pt is on a statin for cholesterol management.  The pt is on a daily aspirin.   Other AC:  Xarelto The pt is on BB, ARB for hypertension.   The pt is not diabetic. Tobacco hx:  current  Past Medical History:  Diagnosis Date  . Anxiety   . Asthma   . Bipolar disorder ([place])   . Breast lump 02/2008   Biopsy 05/2008: showed no evidence of malignancy  . Breast mass in female    bi lat   . CHF (congestive heart failure) ([place])   . Chronic pain syndrome   . Degenerative joint disease of knee   . Dental caries   . Depression   . 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 Endometrial mass 10/12/2012   Endometrium, biopsy on 11/04/12 - PROLIFERATIVE ENDOMETRIUM AND ABUNDANT MUCUS. NO HYPERPLASIA OR CARCINOMA.   . Fibromyalgia   . Hyperlipidemia   . Hypertension   . Irregular menses 9/08  . Joint pain   . Mixed stress and urge urinary incontinence    [facility], underwent cystoscopy & uroflowmetry   . Pulmonary edema   . PVD (peripheral vascular disease) ([place])    s/p L fem-pop bypass 11/21/08; graft occluded 12/28/08;  aortogram w/ bilat LE runoff: 1.  bilat diffuse SFA occlusive dz, 2.  Mod to severe above-knee popliteal dz,  3.  Bilat 3-vessel runoff w/ mild tibial occlusive dz.  . Restless leg syndrome   . Sigmoid diverticulitis 10/26/2012  . Tobacco abuse   . Wears glasses     Past Surgical History:  Procedure Laterality Date  .  Right external iliac artery stent  11/04/2008  . ABDOMINAL AORTAGRAM     11/04/2008; 10/14/2006  . AMPUTATION Left 02/16/2019   Procedure: AMPUTATION ABOVE KNEE;  Surgeon: Angelia Mould, MD;  Location: [facility];  Service: Vascular;  Laterality: Left;  . AMPUTATION Left 04/02/2019   Procedure: IRRIGATION AND DEBRIDEMENT LEFT ABOVE KNEE AMPUTATION;  Surgeon: [name]  S, MD;  Location: [facility];  Service: Vascular;  Laterality: Left;  . APPLICATION OF WOUND VAC Left 04/02/2019   Procedure: APPLICATION OF WOUND VAC TO LEFT KNEE AMPUTATION;  Surgeon: Angelia Mould, MD;  Location: [facility];  Service: Vascular;  Laterality: Left;  . BREAST LUMPECTOMY W/ NEEDLE LOCALIZATION Right 06/13/2008  . BREAST LUMPECTOMY WITH RADIOACTIVE SEED LOCALIZATION Bilateral 05/03/2014   Procedure: BREAST LUMPECTOMY WITH RADIOACTIVE SEED LOCALIZATION;  Surgeon: Joyice Faster. Cornett, MD;  Location: [facility];  Service: General;  Laterality: Bilateral;  . COLONOSCOPY WITH PROPOFOL  01/25/2014  . FEMORAL ENDARTERECTOMY Left 11/21/2008  . FEMORAL-POPLITEAL BYPASS  GRAFT Left 11/21/2008  . I & D EXTREMITY Left 03/29/2019   Procedure: IRRIGATION AND DEBRIDEMENT LEFT ABOVE KNEE AMPUTATION;  Surgeon: Elam Dutch, MD;  Location: [facility];  Service: Vascular;  Laterality: Left;  . LEFT HEART CATHETERIZATION WITH CORONARY ANGIOGRAM N/A 02/25/2014   Procedure: LEFT HEART CATHETERIZATION WITH CORONARY ANGIOGRAM;  Surgeon: Peter M Martinique, MD;  Location: [facility] CATH LAB;  Service: Cardiovascular;  Laterality: N/A;  . TONSILLECTOMY    . TUBAL LIGATION      Social History   Socioeconomic History  . Marital status: Divorced    Spouse name: Not on file  . Number of children: 4  . Years of education: Not on file  . Highest education level: Not on file  Occupational History  . Occupation: disable    Employer: UNEMPLOYED  Tobacco Use  . Smoking status: Current Every Day Smoker    Packs/day: 0.50    Years: 18.00    Pack years: 9.00    Types: Cigarettes  . Smokeless tobacco: Never Used  . Tobacco comment: 1pk 1/2 PACK DAY 2-3 0r none  Substance and Sexual Activity  . Alcohol use: Not Currently    Alcohol/week: 0.0 standard drinks  . Drug use: No    Comment: hx of marijuana and cocaine use; uses Suboxone patch  . Sexual activity: Never  Other Topics Concern  . Not on file  Social History Narrative   Financial assistance application initiated. Patient needs to submit further paperwork to complete   [facility]  September 11, 2010 2:04 PM   Financial assistance approved for 100% discount at [facility] and has [facility] card   [name]  October 04, 2010 5:29 PM   Social Determinants of Health   Financial Resource Strain:   . Difficulty of Paying Living Expenses:   Food Insecurity:   . Worried About Charity fundraiser in the Last Year:   . Arboriculturist in the Last Year:   Transportation Needs:   . Film/video editor (Medical):   Marland Kitchen Lack of Transportation (Non-Medical):   Physical Activity:   . Days of Exercise per Week:   . Minutes of Exercise per  Session:   Stress:   . Feeling of Stress :   Social Connections:   . Frequency of Communication with Friends and Family:   . Frequency of Social Gatherings with Friends and Family:   . Attends Religious Services:   . Active Member of Clubs or Organizations:   . Attends Archivist Meetings:   Marland Kitchen Marital Status:   Intimate Partner Violence:   . Fear of Current or Ex-Partner:   . Emotionally Abused:   Marland Kitchen Physically Abused:   . Sexually Abused:     Family History  Problem Relation Age of Onset  . Breast cancer Mother   .  Depression Mother   . Hyperlipidemia Mother   . Mental retardation Mother   . Hyperlipidemia Father   . Mental retardation Father   . Breast cancer Other        GREAT AUNT  . Depression Sister   . Hyperlipidemia Sister   . Mental retardation Sister   . Mental retardation Daughter     Current Outpatient Medications  Medication Sig Dispense Refill  . albuterol (VENTOLIN HFA) 108 (90 Base) MCG/ACT inhaler Inhale 1-2 puffs into the lungs every 6 (six) hours as needed for wheezing or shortness of breath. 1 Inhaler 2  . aspirin EC 81 MG tablet Take 81 mg by mouth daily.    Marland Kitchen atorvastatin (LIPITOR) 40 MG tablet Take 1 tablet (40 mg total) by mouth at bedtime. (Patient taking differently: Take 40 mg by mouth daily. ) 90 tablet 3  . carvedilol (COREG) 12.5 MG tablet Take 0.5 tablets (6.25 mg total) by mouth 2 (two) times daily with a meal. 180 tablet 1  . cephALEXin (KEFLEX) 500 MG capsule Take 1 capsule (500 mg total) by mouth 3 (three) times daily. 42 capsule 0  . collagenase (SANTYL) ointment Apply 1 application topically daily. Apply to left stump daily with dressing changes. 30 g 1  . diazepam (VALIUM) 2 MG tablet Take 1 tablet (2 mg total) by mouth every 12 (twelve) hours as needed for up to 4 doses for anxiety. 4 tablet 0  . DULoxetine (CYMBALTA) 60 MG capsule Take 60 mg by mouth 2 (two) times daily.     Marland Kitchen escitalopram (LEXAPRO) 10 MG tablet Take 10 mg by  mouth daily.    Marland Kitchen gabapentin (NEURONTIN) 800 MG tablet Take 800-1,600 mg by mouth See admin instructions. 800 mg twice daily, and 1600 mg at bedtime    . hydrochlorothiazide (HYDRODIURIL) 25 MG tablet Take 0.5 tablets (12.5 mg total) by mouth daily. 90 tablet 1  . hydrOXYzine (ATARAX/VISTARIL) 50 MG tablet Take 50 mg by mouth See admin instructions. Take 50 mg by mouth at bedtime and an additional 50 mg two times a day as needed for anxiety    . Lurasidone HCl (LATUDA) 120 MG TABS Take 80 mg by mouth 2 (two) times daily.     . Multiple Vitamins-Minerals (MULTIVITAMIN WITH MINERALS) tablet Take 1 tablet by mouth daily.    . nicotine (NICODERM CQ - DOSED IN MG/24 HR) 7 mg/24hr patch Place 1 patch (7 mg total) onto the skin daily as needed (nicotine replacement). 28 patch 0  . polyethylene glycol (MIRALAX / GLYCOLAX) 17 g packet Take 17 g by mouth daily as needed for mild constipation. 14 each 0  . vitamin E (VITAMIN E) 400 UNIT capsule Take 800 Units by mouth 2 (two) times daily.    Alveda Reasons 10 MG TABS tablet Take 10 mg by mouth daily.  5  . losartan (COZAAR) 25 MG tablet Take 1 tablet (25 mg total) by mouth daily. 90 tablet 3   No current facility-administered medications for this visit.    Allergies  Allergen Reactions  . Propoxyphene N-Acetaminophen Anaphylaxis and Swelling  . Strawberry Extract Anaphylaxis and Shortness Of Breath    "I go into shock"  . Buspar [Buspirone] Swelling    "Swells my face"  . Grapefruit Extract Other (See Comments)    Interaction with medications she's taking  . Tape Itching    Patient says that it itches and swells with some adhesive dressings.     REVIEW OF SYSTEMS:  Review  of Systems  Constitutional: Negative for chills, fever and malaise/fatigue.  HENT: Negative for congestion and sore throat.   Respiratory: Negative for cough, shortness of breath and wheezing.   Cardiovascular: Negative for chest pain and palpitations.  Gastrointestinal: Negative  for abdominal pain, constipation, diarrhea, heartburn, nausea and vomiting.  Genitourinary: Negative for dysuria.  Musculoskeletal: Negative for myalgias.  Neurological: Negative for dizziness, weakness and headaches.    PHYSICAL EXAMINATION:  Vitals:   04/26/20 0811  BP: 105/66  Pulse: 74  Resp: 16  Temp: 97.7 F (36.5 C)  TempSrc: Temporal  SpO2: 97%  Weight: 220 lb (99.8 kg)  Height: 5' 4.5" (1.638 m)    General:  WDWN in NAD; vital signs documented above Gait: in wheel chair HENT: WNL, normocephalic Pulmonary: normal non-labored breathing wihtout  wheezing Cardiac: regular HR, without  Murmurs without carotid bruit Abdomen:obese, soft, NT, no masses Skin: without rashes Vascular Exam/Pulses: 2+ radial pulses bilaterally. Left aka stump with some serosanguinous drainage from pinpoint hole in the middle of the incision line, no surrounding erythema. No increased drainage on compression surrounding pin point hole. Some swelling of distal amputation stump. Clinically does not appear to have fluid collection. Distal Amputation site is extremity tender to palpation Musculoskeletal: no muscle wasting or atrophy  Neurologic: A&O X 3;  No focal weakness or paresthesias are detected Psychiatric:  The pt has Normal affect.   ASSESSMENT/PLAN:: 59 y.o. female here for follow up for left AKA with recurrent healing issues. She was last seen on 01/18/20 and was given a 2 week course of antibiotics which she completed but it was discussed with her at that time that if there was no resolution she would likely need surgery to washout/ debride amputation site. She is not any better presently with significant pain and continued drainage. I have discussed with her that at this point taking her to the OR to clean out the amputation site is likely her best option. She asked that I call and discuss this with her daughter Ailsa Massaquoi which I have done. All questions answered by both patient and her daughter  and they are agreeable to proceed. She will need to hold her Aspirin and Xarelto for this procedure. She will be scheduled for left above knee amputation Incision and debridement. I discussed with patient that subsequent wound VAC may need to be placed and she understands   Karoline Caldwell, PA-C [facility] [phone]  Clinic MD: Dr. Scot Dock

## 2020-04-26 NOTE — Progress Notes (Signed)
Office Note     CC:  follow up Requesting Provider:  Nolene Ebbs, MD  HPI: Alisha [name] is a 59 y.o. (04-22-1961) female who presents for follow up on non healing left above knee amputation. She has hx of left AKA by Dr. Scot Dock in February of 2020 with subsequent debridement of poorly healing left AKA on 03/29/2019 by Dr. Oneida Alar and excisional debridement of left AKA & Placement of VAC on 04/02/2019 by Dr. Scot Dock.  She was last seen on 01/18/20. At that time she was having pain in the stump and drainage from central pore. She was not having any fevers or chills at the time. She was given 14 day prescription of Keflex to see if this would resolve her infection. She was instructed to come back after completion of this course of antibiotics but has not followed up since.  She returns to day with continued pain at the left amputation stump. Very sensitive to any manipulation of her AKA site. She continues to have "brown and bloody" drainage from a small central pore. She says her home care nurses have been placing a dressing daily and sometimes this becomes saturated and has to be changed more than once a day. She does state that she completed the course of antibiotics and there was several days where it seemed to be improved but then drainage returned. She denies any fever or chills or feeling ill.   The pt is on a statin for cholesterol management.  The pt is on a daily aspirin.   Other AC:  Xarelto The pt is on BB, ARB for hypertension.   The pt is not diabetic. Tobacco hx:  current  Past Medical History:  Diagnosis Date  . Anxiety   . Asthma   . Bipolar disorder ([place])   . Breast lump 02/2008   Biopsy 05/2008: showed no evidence of malignancy  . Breast mass in female    bi lat   . CHF (congestive heart failure) ([place])   . Chronic pain syndrome   . Degenerative joint disease of knee   . Dental caries   . Depression   . 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 Endometrial mass 10/12/2012   Endometrium, biopsy on 11/04/12 - PROLIFERATIVE ENDOMETRIUM AND ABUNDANT MUCUS. NO HYPERPLASIA OR CARCINOMA.   . Fibromyalgia   . Hyperlipidemia   . Hypertension   . Irregular menses 9/08  . Joint pain   . Mixed stress and urge urinary incontinence    Being followed by [facility], underwent cystoscopy & uroflowmetry   . Pulmonary edema   . PVD (peripheral vascular disease) ([place])    s/p L fem-pop bypass 11/21/08; graft occluded 12/28/08;  aortogram w/ bilat LE runoff: 1.  bilat diffuse SFA occlusive dz, 2.  Mod to severe above-knee popliteal dz,  3.  Bilat 3-vessel runoff w/ mild tibial occlusive dz.  . Restless leg syndrome   . Sigmoid diverticulitis 10/26/2012  . Tobacco abuse   . Wears glasses     Past Surgical History:  Procedure Laterality Date  .  Right external iliac artery stent  11/04/2008  . ABDOMINAL AORTAGRAM     11/04/2008; 10/14/2006  . AMPUTATION Left 02/16/2019   Procedure: AMPUTATION ABOVE KNEE;  Surgeon: Angelia Mould, MD;  Location: [facility];  Service: Vascular;  Laterality: Left;  . AMPUTATION Left 04/02/2019   Procedure: IRRIGATION AND DEBRIDEMENT LEFT ABOVE KNEE AMPUTATION;  Surgeon: [name]  S, MD;  Location: [facility];  Service: Vascular;  Laterality: Left;  . APPLICATION OF WOUND VAC Left 04/02/2019   Procedure: APPLICATION OF WOUND VAC TO LEFT KNEE AMPUTATION;  Surgeon: Angelia Mould, MD;  Location: [facility];  Service: Vascular;  Laterality: Left;  . BREAST LUMPECTOMY W/ NEEDLE LOCALIZATION Right 06/13/2008  . BREAST LUMPECTOMY WITH RADIOACTIVE SEED LOCALIZATION Bilateral 05/03/2014   Procedure: BREAST LUMPECTOMY WITH RADIOACTIVE SEED LOCALIZATION;  Surgeon: Joyice Faster. Cornett, MD;  Location: [facility];  Service: General;  Laterality: Bilateral;  . COLONOSCOPY WITH PROPOFOL  01/25/2014  . FEMORAL ENDARTERECTOMY Left 11/21/2008  . FEMORAL-POPLITEAL BYPASS  GRAFT Left 11/21/2008  . I & D EXTREMITY Left 03/29/2019   Procedure: IRRIGATION AND DEBRIDEMENT LEFT ABOVE KNEE AMPUTATION;  Surgeon: Elam Dutch, MD;  Location: [facility];  Service: Vascular;  Laterality: Left;  . LEFT HEART CATHETERIZATION WITH CORONARY ANGIOGRAM N/A 02/25/2014   Procedure: LEFT HEART CATHETERIZATION WITH CORONARY ANGIOGRAM;  Surgeon: Peter M Martinique, MD;  Location: [facility] CATH LAB;  Service: Cardiovascular;  Laterality: N/A;  . TONSILLECTOMY    . TUBAL LIGATION      Social History   Socioeconomic History  . Marital status: Divorced    Spouse name: Not on file  . Number of children: 4  . Years of education: Not on file  . Highest education level: Not on file  Occupational History  . Occupation: disable    Employer: UNEMPLOYED  Tobacco Use  . Smoking status: Current Every Day Smoker    Packs/day: 0.50    Years: 18.00    Pack years: 9.00    Types: Cigarettes  . Smokeless tobacco: Never Used  . Tobacco comment: 1pk 1/2 PACK DAY 2-3 0r none  Substance and Sexual Activity  . Alcohol use: Not Currently    Alcohol/week: 0.0 standard drinks  . Drug use: No    Comment: hx of marijuana and cocaine use; uses Suboxone patch  . Sexual activity: Never  Other Topics Concern  . Not on file  Social History Narrative   Financial assistance application initiated. Patient needs to submit further paperwork to complete   [facility]  September 11, 2010 2:04 PM   Financial assistance approved for 100% discount at [facility] and has [facility] card   [name]  October 04, 2010 5:29 PM   Social Determinants of Health   Financial Resource Strain:   . Difficulty of Paying Living Expenses:   Food Insecurity:   . Worried About Charity fundraiser in the Last Year:   . Arboriculturist in the Last Year:   Transportation Needs:   . Film/video editor (Medical):   Marland Kitchen Lack of Transportation (Non-Medical):   Physical Activity:   . Days of Exercise per Week:   . Minutes of Exercise per  Session:   Stress:   . Feeling of Stress :   Social Connections:   . Frequency of Communication with Friends and Family:   . Frequency of Social Gatherings with Friends and Family:   . Attends Religious Services:   . Active Member of Clubs or Organizations:   . Attends Archivist Meetings:   Marland Kitchen Marital Status:   Intimate Partner Violence:   . Fear of Current or Ex-Partner:   . Emotionally Abused:   Marland Kitchen Physically Abused:   . Sexually Abused:     Family History  Problem Relation Age of Onset  . Breast cancer Mother   .  Depression Mother   . Hyperlipidemia Mother   . Mental retardation Mother   . Hyperlipidemia Father   . Mental retardation Father   . Breast cancer Other        GREAT AUNT  . Depression Sister   . Hyperlipidemia Sister   . Mental retardation Sister   . Mental retardation Daughter     Current Outpatient Medications  Medication Sig Dispense Refill  . albuterol (VENTOLIN HFA) 108 (90 Base) MCG/ACT inhaler Inhale 1-2 puffs into the lungs every 6 (six) hours as needed for wheezing or shortness of breath. 1 Inhaler 2  . aspirin EC 81 MG tablet Take 81 mg by mouth daily.    Marland Kitchen atorvastatin (LIPITOR) 40 MG tablet Take 1 tablet (40 mg total) by mouth at bedtime. (Patient taking differently: Take 40 mg by mouth daily. ) 90 tablet 3  . carvedilol (COREG) 12.5 MG tablet Take 0.5 tablets (6.25 mg total) by mouth 2 (two) times daily with a meal. 180 tablet 1  . cephALEXin (KEFLEX) 500 MG capsule Take 1 capsule (500 mg total) by mouth 3 (three) times daily. 42 capsule 0  . collagenase (SANTYL) ointment Apply 1 application topically daily. Apply to left stump daily with dressing changes. 30 g 1  . diazepam (VALIUM) 2 MG tablet Take 1 tablet (2 mg total) by mouth every 12 (twelve) hours as needed for up to 4 doses for anxiety. 4 tablet 0  . DULoxetine (CYMBALTA) 60 MG capsule Take 60 mg by mouth 2 (two) times daily.     Marland Kitchen escitalopram (LEXAPRO) 10 MG tablet Take 10 mg by  mouth daily.    Marland Kitchen gabapentin (NEURONTIN) 800 MG tablet Take 800-1,600 mg by mouth See admin instructions. 800 mg twice daily, and 1600 mg at bedtime    . hydrochlorothiazide (HYDRODIURIL) 25 MG tablet Take 0.5 tablets (12.5 mg total) by mouth daily. 90 tablet 1  . hydrOXYzine (ATARAX/VISTARIL) 50 MG tablet Take 50 mg by mouth See admin instructions. Take 50 mg by mouth at bedtime and an additional 50 mg two times a day as needed for anxiety    . Lurasidone HCl (LATUDA) 120 MG TABS Take 80 mg by mouth 2 (two) times daily.     . Multiple Vitamins-Minerals (MULTIVITAMIN WITH MINERALS) tablet Take 1 tablet by mouth daily.    . nicotine (NICODERM CQ - DOSED IN MG/24 HR) 7 mg/24hr patch Place 1 patch (7 mg total) onto the skin daily as needed (nicotine replacement). 28 patch 0  . polyethylene glycol (MIRALAX / GLYCOLAX) 17 g packet Take 17 g by mouth daily as needed for mild constipation. 14 each 0  . vitamin E (VITAMIN E) 400 UNIT capsule Take 800 Units by mouth 2 (two) times daily.    Alveda Reasons 10 MG TABS tablet Take 10 mg by mouth daily.  5  . losartan (COZAAR) 25 MG tablet Take 1 tablet (25 mg total) by mouth daily. 90 tablet 3   No current facility-administered medications for this visit.    Allergies  Allergen Reactions  . Propoxyphene N-Acetaminophen Anaphylaxis and Swelling  . Strawberry Extract Anaphylaxis and Shortness Of Breath    "I go into shock"  . Buspar [Buspirone] Swelling    "Swells my face"  . Grapefruit Extract Other (See Comments)    Interaction with medications she's taking  . Tape Itching    Patient says that it itches and swells with some adhesive dressings.     REVIEW OF SYSTEMS:  Review  of Systems  Constitutional: Negative for chills, fever and malaise/fatigue.  HENT: Negative for congestion and sore throat.   Respiratory: Negative for cough, shortness of breath and wheezing.   Cardiovascular: Negative for chest pain and palpitations.  Gastrointestinal: Negative  for abdominal pain, constipation, diarrhea, heartburn, nausea and vomiting.  Genitourinary: Negative for dysuria.  Musculoskeletal: Negative for myalgias.  Neurological: Negative for dizziness, weakness and headaches.    PHYSICAL EXAMINATION:  Vitals:   04/26/20 0811  BP: 105/66  Pulse: 74  Resp: 16  Temp: 97.7 F (36.5 C)  TempSrc: Temporal  SpO2: 97%  Weight: 220 lb (99.8 kg)  Height: 5' 4.5" (1.638 m)    General:  WDWN in NAD; vital signs documented above Gait: in wheel chair HENT: WNL, normocephalic Pulmonary: normal non-labored breathing wihtout  wheezing Cardiac: regular HR, without  Murmurs without carotid bruit Abdomen:obese, soft, NT, no masses Skin: without rashes Vascular Exam/Pulses: 2+ radial pulses bilaterally. Left aka stump with some serosanguinous drainage from pinpoint hole in the middle of the incision line, no surrounding erythema. No increased drainage on compression surrounding pin point hole. Some swelling of distal amputation stump. Clinically does not appear to have fluid collection. Distal Amputation site is extremity tender to palpation Musculoskeletal: no muscle wasting or atrophy  Neurologic: A&O X 3;  No focal weakness or paresthesias are detected Psychiatric:  The pt has Normal affect.   ASSESSMENT/PLAN:: 59 y.o. female here for follow up for left AKA with recurrent healing issues. She was last seen on 01/18/20 and was given a 2 week course of antibiotics which she completed but it was discussed with her at that time that if there was no resolution she would likely need surgery to washout/ debride amputation site. She is not any better presently with significant pain and continued drainage. I have discussed with her that at this point taking her to the OR to clean out the amputation site is likely her best option. She asked that I call and discuss this with her daughter Lilyana Pazienza which I have done. All questions answered by both patient and her daughter  and they are agreeable to proceed. She will need to hold her Aspirin and Xarelto for this procedure. She will be scheduled for left above knee amputation Incision and debridement. I discussed with patient that subsequent wound VAC may need to be placed and she understands   Karoline Caldwell, PA-C [facility] [phone]  Clinic MD: Dr. Scot Dock

## 2020-05-15 ENCOUNTER — Other Ambulatory Visit: Payer: Self-pay

## 2020-05-19 ENCOUNTER — Other Ambulatory Visit: Payer: Self-pay

## 2020-05-19 ENCOUNTER — Encounter (HOSPITAL_COMMUNITY): Payer: Self-pay | Admitting: Vascular Surgery

## 2020-05-19 NOTE — Progress Notes (Signed)
Spoke with pt for pre-op call. Pt states she has a hx of CHF, denies any recent symptoms of CHF. Pt also has hx of DVT's. Pt is on Aspirin and Xarelto. In notes by Karoline Caldwell, PA pt is supposed to hold both, but no notation of when to hold. I asked pt if she had stopped both and she said "oh yes! I stopped them as soon as I heard that I was going to have surgery." Her last dose was 8 days ago - 05/11/20.  Pt states she is not diabetic.   Covid test scheduled for Saturday, 05/20/20. Instructed pt that she needs to be in quarantine once she gets the test done and until she comes to the hospital on Monday. She voiced understanding. She thought since she had had the Covid vaccine she would not have to quarantine, but I made sure that she understood that she still has to quarantine.

## 2020-05-20 ENCOUNTER — Other Ambulatory Visit (HOSPITAL_COMMUNITY)
Admission: RE | Admit: 2020-05-20 | Discharge: 2020-05-20 | Disposition: A | Discharge status: Home / Self Care | Payer: Medicare Other | Source: Ambulatory Visit | Attending: Vascular Surgery | Admitting: Vascular Surgery

## 2020-05-20 DIAGNOSIS — Z01812 Encounter for preprocedural laboratory examination: Secondary | ICD-10-CM | POA: Diagnosis present

## 2020-05-20 DIAGNOSIS — Z20822 Contact with and (suspected) exposure to covid-19: Secondary | ICD-10-CM | POA: Insufficient documentation

## 2020-05-20 LAB — SARS CORONAVIRUS 2 (TAT 6-24 HRS): SARS Coronavirus 2: NEGATIVE

## 2020-05-22 ENCOUNTER — Encounter (HOSPITAL_COMMUNITY)
Admission: RE | Disposition: A | Discharge status: Home Health | Payer: Self-pay | Source: Home / Self Care | Attending: Vascular Surgery

## 2020-05-22 ENCOUNTER — Other Ambulatory Visit: Payer: Self-pay

## 2020-05-22 ENCOUNTER — Encounter (HOSPITAL_COMMUNITY): Payer: Self-pay | Admitting: Vascular Surgery

## 2020-05-22 ENCOUNTER — Inpatient Hospital Stay (HOSPITAL_COMMUNITY): Payer: Medicare Other | Admitting: Certified Registered"

## 2020-05-22 ENCOUNTER — Observation Stay (HOSPITAL_COMMUNITY)
Admission: RE | Admit: 2020-05-22 | Discharge: 2020-05-23 | Disposition: A | Discharge status: Home Health | Payer: Medicare Other | Attending: Vascular Surgery | Admitting: Vascular Surgery

## 2020-05-22 DIAGNOSIS — T8189XA Other complications of procedures, not elsewhere classified, initial encounter: Secondary | ICD-10-CM | POA: Diagnosis not present

## 2020-05-22 DIAGNOSIS — I110 Hypertensive heart disease with heart failure: Secondary | ICD-10-CM | POA: Diagnosis not present

## 2020-05-22 DIAGNOSIS — Z7901 Long term (current) use of anticoagulants: Secondary | ICD-10-CM | POA: Insufficient documentation

## 2020-05-22 DIAGNOSIS — E785 Hyperlipidemia, unspecified: Secondary | ICD-10-CM | POA: Insufficient documentation

## 2020-05-22 DIAGNOSIS — M797 Fibromyalgia: Secondary | ICD-10-CM | POA: Diagnosis not present

## 2020-05-22 DIAGNOSIS — F319 Bipolar disorder, unspecified: Secondary | ICD-10-CM | POA: Insufficient documentation

## 2020-05-22 DIAGNOSIS — Z560 Unemployment, unspecified: Secondary | ICD-10-CM | POA: Insufficient documentation

## 2020-05-22 DIAGNOSIS — G894 Chronic pain syndrome: Secondary | ICD-10-CM | POA: Insufficient documentation

## 2020-05-22 DIAGNOSIS — I509 Heart failure, unspecified: Secondary | ICD-10-CM | POA: Diagnosis not present

## 2020-05-22 DIAGNOSIS — Z89612 Acquired absence of left leg above knee: Secondary | ICD-10-CM | POA: Insufficient documentation

## 2020-05-22 DIAGNOSIS — Z7982 Long term (current) use of aspirin: Secondary | ICD-10-CM | POA: Insufficient documentation

## 2020-05-22 DIAGNOSIS — F17210 Nicotine dependence, cigarettes, uncomplicated: Secondary | ICD-10-CM | POA: Diagnosis not present

## 2020-05-22 DIAGNOSIS — F419 Anxiety disorder, unspecified: Secondary | ICD-10-CM | POA: Insufficient documentation

## 2020-05-22 DIAGNOSIS — Z79899 Other long term (current) drug therapy: Secondary | ICD-10-CM | POA: Insufficient documentation

## 2020-05-22 DIAGNOSIS — J45909 Unspecified asthma, uncomplicated: Secondary | ICD-10-CM | POA: Insufficient documentation

## 2020-05-22 DIAGNOSIS — X58XXXA Exposure to other specified factors, initial encounter: Secondary | ICD-10-CM | POA: Diagnosis not present

## 2020-05-22 DIAGNOSIS — G2581 Restless legs syndrome: Secondary | ICD-10-CM | POA: Diagnosis not present

## 2020-05-22 DIAGNOSIS — I739 Peripheral vascular disease, unspecified: Principal | ICD-10-CM | POA: Diagnosis present

## 2020-05-22 DIAGNOSIS — T8781 Dehiscence of amputation stump: Secondary | ICD-10-CM

## 2020-05-22 DIAGNOSIS — Z86718 Personal history of other venous thrombosis and embolism: Secondary | ICD-10-CM | POA: Diagnosis not present

## 2020-05-22 HISTORY — PX: APPLICATION OF WOUND VAC: SHX5189

## 2020-05-22 HISTORY — PX: I & D EXTREMITY: SHX5045

## 2020-05-22 LAB — COMPREHENSIVE METABOLIC PANEL
ALT: 24 U/L (ref 0–44)
AST: 22 U/L (ref 15–41)
Albumin: 4 g/dL (ref 3.5–5.0)
Alkaline Phosphatase: 68 U/L (ref 38–126)
Anion gap: 11 (ref 5–15)
BUN: 11 mg/dL (ref 6–20)
CO2: 21 mmol/L — ABNORMAL LOW (ref 22–32)
Calcium: 9.2 mg/dL (ref 8.9–10.3)
Chloride: 107 mmol/L (ref 98–111)
Creatinine, Ser: 0.92 mg/dL (ref 0.44–1.00)
GFR calc Af Amer: >60 mL/min (ref >60)
GFR calc non Af Amer: >60 mL/min (ref >60)
Glucose, Bld: 134 mg/dL — ABNORMAL HIGH (ref 70–99)
Potassium: 4 mmol/L (ref 3.5–5.1)
Sodium: 139 mmol/L (ref 135–145)
Total Bilirubin: 0.5 mg/dL (ref 0.3–1.2)
Total Protein: 7.8 g/dL (ref 6.5–8.1)

## 2020-05-22 LAB — PROTIME-INR
INR: 1 (ref 0.8–1.2)
Prothrombin Time: 12.7 seconds (ref 11.4–15.2)

## 2020-05-22 LAB — CBC
HCT: 54.4 % — ABNORMAL HIGH (ref 36.0–46.0)
Hemoglobin: 17.4 g/dL — ABNORMAL HIGH (ref 12.0–15.0)
MCH: 31 pg (ref 26.0–34.0)
MCHC: 32 g/dL (ref 30.0–36.0)
MCV: 96.8 fL (ref 80.0–100.0)
Platelets: 226 10*3/uL (ref 150–400)
RBC: 5.62 MIL/uL — ABNORMAL HIGH (ref 3.87–5.11)
RDW: 13.3 % (ref 11.5–15.5)
WBC: 8.4 10*3/uL (ref 4.0–10.5)
nRBC: 0 % (ref 0.0–0.2)

## 2020-05-22 LAB — APTT: aPTT: 29 seconds (ref 24–36)

## 2020-05-22 LAB — GLUCOSE, CAPILLARY: Glucose-Capillary: 136 mg/dL — ABNORMAL HIGH (ref 70–99)

## 2020-05-22 LAB — NO BLOOD PRODUCTS

## 2020-05-22 SURGERY — IRRIGATION AND DEBRIDEMENT EXTREMITY
Anesthesia: General | Site: Leg Upper | Laterality: Left

## 2020-05-22 MED ORDER — CHLORHEXIDINE GLUCONATE 0.12 % MT SOLN: 15.0000 mL | Freq: Once | OROMUCOSAL | Status: AC

## 2020-05-22 MED ORDER — DIPHENHYDRAMINE HCL 50 MG/ML IJ SOLN
INTRAMUSCULAR | Status: AC
  Filled 2020-05-22: qty 1

## 2020-05-22 MED ORDER — CARVEDILOL 6.25 MG PO TABS
6.2500 mg | ORAL_TABLET | Freq: Two times a day (BID) | ORAL | Status: DC
  Administered 2020-05-23: 6.25 mg via ORAL
  Filled 2020-05-22 (×2): qty 1

## 2020-05-22 MED ORDER — ROCURONIUM BROMIDE 10 MG/ML (PF) SYRINGE
PREFILLED_SYRINGE | INTRAVENOUS | Status: AC
  Filled 2020-05-22: qty 10

## 2020-05-22 MED ORDER — KETAMINE HCL 10 MG/ML IJ SOLN
INTRAMUSCULAR | Status: DC | PRN
  Administered 2020-05-22 (×3): 10 mg via INTRAVENOUS

## 2020-05-22 MED ORDER — ATORVASTATIN CALCIUM 40 MG PO TABS: 40.0000 mg | ORAL_TABLET | Freq: Every day | ORAL | Status: DC

## 2020-05-22 MED ORDER — FENTANYL CITRATE (PF) 250 MCG/5ML IJ SOLN
INTRAMUSCULAR | Status: AC
  Filled 2020-05-22: qty 5

## 2020-05-22 MED ORDER — GUAIFENESIN-DM 100-10 MG/5ML PO SYRP: 15.0000 mL | ORAL_SOLUTION | ORAL | Status: DC | PRN

## 2020-05-22 MED ORDER — SODIUM CHLORIDE 0.9% FLUSH: 3.0000 mL | INTRAVENOUS | Status: DC | PRN

## 2020-05-22 MED ORDER — ACETAMINOPHEN 325 MG PO TABS
ORAL_TABLET | ORAL | Status: DC | PRN
  Administered 2020-05-22: 1000 mg via ORAL

## 2020-05-22 MED ORDER — SODIUM CHLORIDE 0.9% FLUSH: 3.0000 mL | Freq: Two times a day (BID) | INTRAVENOUS | Status: DC

## 2020-05-22 MED ORDER — HYDRALAZINE HCL 20 MG/ML IJ SOLN: 5.0000 mg | INTRAMUSCULAR | Status: DC | PRN

## 2020-05-22 MED ORDER — GABAPENTIN 800 MG PO TABS
1600.0000 mg | ORAL_TABLET | Freq: Every day | ORAL | Status: DC
  Filled 2020-05-22: qty 2

## 2020-05-22 MED ORDER — HYDROMORPHONE HCL 1 MG/ML IJ SOLN
INTRAMUSCULAR | Status: AC
  Filled 2020-05-22: qty 1

## 2020-05-22 MED ORDER — CHLORHEXIDINE GLUCONATE CLOTH 2 % EX PADS: 6.0000 | MEDICATED_PAD | Freq: Once | CUTANEOUS | Status: DC

## 2020-05-22 MED ORDER — MIDAZOLAM HCL 2 MG/2ML IJ SOLN
INTRAMUSCULAR | Status: AC
  Filled 2020-05-22: qty 2

## 2020-05-22 MED ORDER — LIDOCAINE 2% (20 MG/ML) 5 ML SYRINGE
INTRAMUSCULAR | Status: AC
  Filled 2020-05-22: qty 5

## 2020-05-22 MED ORDER — ALUM & MAG HYDROXIDE-SIMETH 200-200-20 MG/5ML PO SUSP: 15.0000 mL | ORAL | Status: DC | PRN

## 2020-05-22 MED ORDER — DIAZEPAM 2 MG PO TABS: 2.0000 mg | ORAL_TABLET | Freq: Two times a day (BID) | ORAL | Status: DC | PRN

## 2020-05-22 MED ORDER — PROPOFOL 10 MG/ML IV BOLUS
INTRAVENOUS | Status: DC | PRN
  Administered 2020-05-22: 20 mg via INTRAVENOUS
  Administered 2020-05-22: 150 mg via INTRAVENOUS

## 2020-05-22 MED ORDER — ONDANSETRON HCL 4 MG/2ML IJ SOLN
INTRAMUSCULAR | Status: DC | PRN
  Administered 2020-05-22: 4 mg via INTRAVENOUS

## 2020-05-22 MED ORDER — GABAPENTIN 800 MG PO TABS: 800.0000 mg | ORAL_TABLET | Freq: Every morning | ORAL | Status: DC

## 2020-05-22 MED ORDER — LOSARTAN POTASSIUM 25 MG PO TABS
25.0000 mg | ORAL_TABLET | Freq: Every day | ORAL | Status: DC
  Administered 2020-05-23: 25 mg via ORAL
  Filled 2020-05-22: qty 1

## 2020-05-22 MED ORDER — HYDROCODONE-ACETAMINOPHEN 5-325 MG PO TABS
1.0000 | ORAL_TABLET | ORAL | Status: DC | PRN
  Administered 2020-05-23: 1 via ORAL
  Administered 2020-05-23 (×2): 2 via ORAL
  Filled 2020-05-22 (×3): qty 2

## 2020-05-22 MED ORDER — CEFAZOLIN SODIUM-DEXTROSE 2-4 GM/100ML-% IV SOLN
INTRAVENOUS | Status: AC
  Filled 2020-05-22: qty 100

## 2020-05-22 MED ORDER — RIVAROXABAN 10 MG PO TABS: 10.0000 mg | ORAL_TABLET | Freq: Every day | ORAL | Status: DC

## 2020-05-22 MED ORDER — ONDANSETRON HCL 4 MG/2ML IJ SOLN
4.0000 mg | Freq: Once | INTRAMUSCULAR | Status: AC | PRN
  Administered 2020-05-22: 4 mg via INTRAVENOUS

## 2020-05-22 MED ORDER — ACETAMINOPHEN 325 MG PO TABS: 325.0000 mg | ORAL_TABLET | ORAL | Status: DC | PRN

## 2020-05-22 MED ORDER — ONDANSETRON HCL 4 MG/2ML IJ SOLN
INTRAMUSCULAR | Status: AC
  Filled 2020-05-22: qty 2

## 2020-05-22 MED ORDER — MELATONIN 5 MG PO TABS: 5.0000 mg | ORAL_TABLET | Freq: Every evening | ORAL | Status: DC | PRN

## 2020-05-22 MED ORDER — DULOXETINE HCL 60 MG PO CPEP
60.0000 mg | ORAL_CAPSULE | Freq: Two times a day (BID) | ORAL | Status: DC
  Administered 2020-05-22 – 2020-05-23 (×2): 60 mg via ORAL
  Filled 2020-05-22 (×2): qty 1

## 2020-05-22 MED ORDER — ACETAMINOPHEN 500 MG PO TABS
ORAL_TABLET | ORAL | Status: AC
  Filled 2020-05-22: qty 2

## 2020-05-22 MED ORDER — PHENOL 1.4 % MT LIQD: 1.0000 | OROMUCOSAL | Status: DC | PRN

## 2020-05-22 MED ORDER — HEPARIN SODIUM (PORCINE) 5000 UNIT/ML IJ SOLN
5000.0000 [IU] | Freq: Three times a day (TID) | INTRAMUSCULAR | Status: DC
  Administered 2020-05-23: 5000 [IU] via SUBCUTANEOUS
  Filled 2020-05-22: qty 1

## 2020-05-22 MED ORDER — MIDAZOLAM HCL 2 MG/2ML IJ SOLN
2.0000 mg | Freq: Once | INTRAMUSCULAR | Status: AC
  Administered 2020-05-22: 2 mg via INTRAVENOUS

## 2020-05-22 MED ORDER — CHLORHEXIDINE GLUCONATE 0.12 % MT SOLN
OROMUCOSAL | Status: AC
  Administered 2020-05-22: 15 mL via OROMUCOSAL
  Filled 2020-05-22: qty 15

## 2020-05-22 MED ORDER — HYDROXYZINE HCL 25 MG PO TABS
25.0000 mg | ORAL_TABLET | Freq: Two times a day (BID) | ORAL | Status: DC
  Administered 2020-05-22 – 2020-05-23 (×2): 25 mg via ORAL
  Filled 2020-05-22 (×2): qty 1

## 2020-05-22 MED ORDER — KETAMINE HCL 50 MG/5ML IJ SOSY
PREFILLED_SYRINGE | INTRAMUSCULAR | Status: AC
  Filled 2020-05-22: qty 5

## 2020-05-22 MED ORDER — PROPOFOL 10 MG/ML IV BOLUS
INTRAVENOUS | Status: AC
  Filled 2020-05-22: qty 20

## 2020-05-22 MED ORDER — SODIUM CHLORIDE 0.9 % IV SOLN
250.0000 mL | INTRAVENOUS | Status: DC | PRN
  Administered 2020-05-23: 250 mL via INTRAVENOUS

## 2020-05-22 MED ORDER — POTASSIUM CHLORIDE CRYS ER 10 MEQ PO TBCR
10.0000 meq | EXTENDED_RELEASE_TABLET | Freq: Every day | ORAL | Status: DC
  Administered 2020-05-23: 10 meq via ORAL
  Filled 2020-05-22 (×2): qty 1

## 2020-05-22 MED ORDER — HYDROMORPHONE HCL 1 MG/ML IJ SOLN
0.2500 mg | INTRAMUSCULAR | Status: DC | PRN
  Administered 2020-05-22 (×2): 0.5 mg via INTRAVENOUS

## 2020-05-22 MED ORDER — ACETAMINOPHEN 650 MG RE SUPP: 325.0000 mg | RECTAL | Status: DC | PRN

## 2020-05-22 MED ORDER — GABAPENTIN 800 MG PO TABS: 800.0000 mg | ORAL_TABLET | Freq: Every day | ORAL | Status: DC

## 2020-05-22 MED ORDER — CEFAZOLIN SODIUM-DEXTROSE 2-4 GM/100ML-% IV SOLN
2.0000 g | INTRAVENOUS | Status: AC
  Administered 2020-05-22: 2 g via INTRAVENOUS

## 2020-05-22 MED ORDER — LURASIDONE HCL 40 MG PO TABS
120.0000 mg | ORAL_TABLET | Freq: Every day | ORAL | Status: DC
  Administered 2020-05-23 (×2): 120 mg via ORAL
  Filled 2020-05-22 (×2): qty 3
  Filled 2020-05-22: qty 2
  Filled 2020-05-22: qty 3

## 2020-05-22 MED ORDER — GABAPENTIN 400 MG PO CAPS
800.0000 mg | ORAL_CAPSULE | Freq: Every day | ORAL | Status: DC
  Administered 2020-05-23: 800 mg via ORAL
  Filled 2020-05-22: qty 2

## 2020-05-22 MED ORDER — GABAPENTIN 400 MG PO CAPS: 1600.0000 mg | ORAL_CAPSULE | Freq: Every day | ORAL | Status: DC

## 2020-05-22 MED ORDER — FENTANYL CITRATE (PF) 100 MCG/2ML IJ SOLN
INTRAMUSCULAR | Status: DC | PRN
  Administered 2020-05-22: 50 ug via INTRAVENOUS
  Administered 2020-05-22: 25 ug via INTRAVENOUS
  Administered 2020-05-22: 50 ug via INTRAVENOUS
  Administered 2020-05-22: 25 ug via INTRAVENOUS
  Administered 2020-05-22: 50 ug via INTRAVENOUS
  Administered 2020-05-22 (×2): 25 ug via INTRAVENOUS

## 2020-05-22 MED ORDER — PHENYLEPHRINE 40 MCG/ML (10ML) SYRINGE FOR IV PUSH (FOR BLOOD PRESSURE SUPPORT)
PREFILLED_SYRINGE | INTRAVENOUS | Status: DC | PRN
  Administered 2020-05-22: 80 ug via INTRAVENOUS
  Administered 2020-05-22: 120 ug via INTRAVENOUS

## 2020-05-22 MED ORDER — MIDAZOLAM HCL 5 MG/5ML IJ SOLN
INTRAMUSCULAR | Status: DC | PRN
  Administered 2020-05-22 (×2): 1 mg via INTRAVENOUS

## 2020-05-22 MED ORDER — ASPIRIN EC 81 MG PO TBEC
81.0000 mg | DELAYED_RELEASE_TABLET | Freq: Every day | ORAL | Status: DC
  Administered 2020-05-22 – 2020-05-23 (×2): 81 mg via ORAL
  Filled 2020-05-22 (×2): qty 1

## 2020-05-22 MED ORDER — ONDANSETRON HCL 4 MG/2ML IJ SOLN: 4.0000 mg | Freq: Four times a day (QID) | INTRAMUSCULAR | Status: DC | PRN

## 2020-05-22 MED ORDER — PANTOPRAZOLE SODIUM 40 MG PO TBEC
40.0000 mg | DELAYED_RELEASE_TABLET | Freq: Every day | ORAL | Status: DC
  Administered 2020-05-22 – 2020-05-23 (×2): 40 mg via ORAL
  Filled 2020-05-22 (×2): qty 1

## 2020-05-22 MED ORDER — METOPROLOL TARTRATE 5 MG/5ML IV SOLN: 2.0000 mg | INTRAVENOUS | Status: DC | PRN

## 2020-05-22 MED ORDER — ESCITALOPRAM OXALATE 20 MG PO TABS
20.0000 mg | ORAL_TABLET | Freq: Every day | ORAL | Status: DC
  Administered 2020-05-23: 20 mg via ORAL
  Filled 2020-05-22: qty 1

## 2020-05-22 MED ORDER — ALBUTEROL SULFATE (2.5 MG/3ML) 0.083% IN NEBU
2.5000 mg | INHALATION_SOLUTION | Freq: Four times a day (QID) | RESPIRATORY_TRACT | Status: DC | PRN
  Administered 2020-05-23: 2.5 mg via RESPIRATORY_TRACT
  Filled 2020-05-22: qty 3

## 2020-05-22 MED ORDER — DIPHENHYDRAMINE HCL 50 MG/ML IJ SOLN
INTRAMUSCULAR | Status: DC | PRN
  Administered 2020-05-22: 12.5 mg via INTRAVENOUS

## 2020-05-22 MED ORDER — LIDOCAINE 2% (20 MG/ML) 5 ML SYRINGE
INTRAMUSCULAR | Status: DC | PRN
  Administered 2020-05-22: 100 mg via INTRAVENOUS

## 2020-05-22 MED ORDER — VITAMIN E 180 MG (400 UNIT) PO CAPS: 400.0000 [IU] | ORAL_CAPSULE | Freq: Two times a day (BID) | ORAL | Status: DC

## 2020-05-22 MED ORDER — LABETALOL HCL 5 MG/ML IV SOLN: 10.0000 mg | INTRAVENOUS | Status: DC | PRN

## 2020-05-22 MED ORDER — HYDROCHLOROTHIAZIDE 25 MG PO TABS
12.5000 mg | ORAL_TABLET | Freq: Every day | ORAL | Status: DC
  Administered 2020-05-23: 12.5 mg via ORAL
  Filled 2020-05-22: qty 1

## 2020-05-22 MED ORDER — NICOTINE 7 MG/24HR TD PT24: 7.0000 mg | MEDICATED_PATCH | Freq: Every day | TRANSDERMAL | Status: DC | PRN

## 2020-05-22 MED ORDER — CEFAZOLIN SODIUM-DEXTROSE 2-4 GM/100ML-% IV SOLN
2.0000 g | Freq: Three times a day (TID) | INTRAVENOUS | Status: DC
  Administered 2020-05-23 (×3): 2 g via INTRAVENOUS
  Filled 2020-05-22 (×3): qty 100

## 2020-05-22 MED ORDER — SODIUM CHLORIDE 0.9 % IV SOLN: INTRAVENOUS | Status: DC

## 2020-05-22 MED ORDER — GABAPENTIN 400 MG PO CAPS
800.0000 mg | ORAL_CAPSULE | Freq: Every morning | ORAL | Status: DC
  Administered 2020-05-23: 800 mg via ORAL
  Filled 2020-05-22: qty 2

## 2020-05-22 MED ORDER — DEXAMETHASONE SODIUM PHOSPHATE 10 MG/ML IJ SOLN
INTRAMUSCULAR | Status: AC
  Filled 2020-05-22: qty 1

## 2020-05-22 MED ORDER — POTASSIUM CHLORIDE CRYS ER 20 MEQ PO TBCR: 20.0000 meq | EXTENDED_RELEASE_TABLET | Freq: Once | ORAL | Status: DC

## 2020-05-22 MED ORDER — ORAL CARE MOUTH RINSE: 15.0000 mL | Freq: Once | OROMUCOSAL | Status: AC

## 2020-05-22 MED ORDER — MEPERIDINE HCL 25 MG/ML IJ SOLN: 6.2500 mg | INTRAMUSCULAR | Status: DC | PRN

## 2020-05-22 MED ORDER — GABAPENTIN 800 MG PO TABS: 800.0000 mg | ORAL_TABLET | ORAL | Status: DC

## 2020-05-22 MED ORDER — HYDROMORPHONE HCL 1 MG/ML IJ SOLN
0.5000 mg | INTRAMUSCULAR | Status: DC | PRN
  Administered 2020-05-22 – 2020-05-23 (×3): 1 mg via INTRAVENOUS
  Filled 2020-05-22 (×2): qty 1

## 2020-05-22 MED ORDER — LACTATED RINGERS IV SOLN: INTRAVENOUS | Status: DC

## 2020-05-22 SURGICAL SUPPLY — 34 items
BNDG ELASTIC 4X5.8 VLCR STR LF (GAUZE/BANDAGES/DRESSINGS) IMPLANT
BNDG ELASTIC 6X5.8 VLCR STR LF (GAUZE/BANDAGES/DRESSINGS) IMPLANT
BNDG GAUZE ELAST 4 BULKY (GAUZE/BANDAGES/DRESSINGS) IMPLANT
CANISTER SUCT 3000ML PPV (MISCELLANEOUS) ×2 IMPLANT
CANISTER WOUND CARE 500ML ATS (WOUND CARE) ×1 IMPLANT
COVER SURGICAL LIGHT HANDLE (MISCELLANEOUS) ×2 IMPLANT
COVER WAND RF STERILE (DRAPES) ×1 IMPLANT
DRSG VAC ATS SM SENSATRAC (GAUZE/BANDAGES/DRESSINGS) ×1 IMPLANT
ELECT REM PT RETURN 9FT ADLT (ELECTROSURGICAL) ×2
ELECTRODE REM PT RTRN 9FT ADLT (ELECTROSURGICAL) ×1 IMPLANT
GAUZE SPONGE 4X4 12PLY STRL (GAUZE/BANDAGES/DRESSINGS) ×1 IMPLANT
GLOVE BIO SURGEON STRL SZ7.5 (GLOVE) ×2 IMPLANT
GOWN STRL REUS W/ TWL LRG LVL3 (GOWN DISPOSABLE) ×3 IMPLANT
GOWN STRL REUS W/TWL LRG LVL3 (GOWN DISPOSABLE) ×6
KIT BASIN OR (CUSTOM PROCEDURE TRAY) ×2 IMPLANT
KIT TURNOVER KIT B (KITS) ×2 IMPLANT
NS IRRIG 1000ML POUR BTL (IV SOLUTION) ×2 IMPLANT
PACK GENERAL/GYN (CUSTOM PROCEDURE TRAY) IMPLANT
PACK UNIVERSAL I (CUSTOM PROCEDURE TRAY) IMPLANT
PAD ARMBOARD 7.5X6 YLW CONV (MISCELLANEOUS) ×4 IMPLANT
STAPLER VISISTAT 35W (STAPLE) IMPLANT
SUT ETHILON 3 0 PS 1 (SUTURE) IMPLANT
SUT SILK 2 0 (SUTURE) ×2
SUT SILK 2-0 18XBRD TIE 12 (SUTURE) IMPLANT
SUT SILK 3 0 (SUTURE) ×2
SUT SILK 3-0 18XBRD TIE 12 (SUTURE) IMPLANT
SUT VIC AB 2-0 CTX 36 (SUTURE) IMPLANT
SUT VIC AB 3-0 SH 27 (SUTURE)
SUT VIC AB 3-0 SH 27X BRD (SUTURE) IMPLANT
SUT VICRYL 4-0 PS2 18IN ABS (SUTURE) IMPLANT
SWAB COLLECTION DEVICE MRSA (MISCELLANEOUS) ×1 IMPLANT
SWAB CULTURE ESWAB REG 1ML (MISCELLANEOUS) ×1 IMPLANT
TOWEL GREEN STERILE (TOWEL DISPOSABLE) ×3 IMPLANT
WATER STERILE IRR 1000ML POUR (IV SOLUTION) ×2 IMPLANT

## 2020-05-22 NOTE — Progress Notes (Signed)
Comfort measures offered. Aware of surgical delay.

## 2020-05-22 NOTE — Interval H&P Note (Signed)
History and Physical Interval Note:  05/22/2020 3:31 PM  Alisha [name]  has presented today for surgery, with the diagnosis of PERIPHERAL ARTERY DISEASE.  The various methods of treatment have been discussed with the patient and family. After consideration of risks, benefits and other options for treatment, the patient has consented to  Procedure(s): IRRIGATION AND DEBRIDEMENT OF LEFT ABOVE KNEE AMPUTATION (Left) as a surgical intervention.  The patient's history has been reviewed, patient examined, no change in status, stable for surgery.  I have reviewed the patient's chart and labs.  Questions were answered to the patient's satisfaction.     Ruta Hinds

## 2020-05-22 NOTE — Anesthesia Procedure Notes (Signed)
Procedure Name: LMA Insertion °Performed by: Liliann File H, CRNA °Pre-anesthesia Checklist: Patient identified, Emergency Drugs available, Suction available and Patient being monitored °Patient Re-evaluated:Patient Re-evaluated prior to induction °Oxygen Delivery Method: Circle System Utilized °Preoxygenation: Pre-oxygenation with 100% oxygen °Induction Type: IV induction °Ventilation: Mask ventilation without difficulty °LMA: LMA inserted °LMA Size: 4.0 °Number of attempts: 1 °Airway Equipment and Method: Bite block °Placement Confirmation: positive ETCO2 °Tube secured with: Tape °Dental Injury: Teeth and Oropharynx as per pre-operative assessment  ° ° ° ° ° ° °

## 2020-05-22 NOTE — Progress Notes (Signed)
Patient has not voided since arrival to pre-op; unable to obtain urine sample for U/A at this time. Patient aware of need of urine sample; told to call for assistance to void when able to. Call bell within reach.

## 2020-05-22 NOTE — Op Note (Addendum)
Procedure: Incision and drainage left above-knee amputation  Preoperative diagnosis: Nonhealing wound left above-knee amputation  Postoperative diagnosis: Same  Anesthesia: General  Assistant: Gerri Lins, PA-C  Operative findings: Floating bone fragment possible nidus for chronic sinus drainage sent for culture  Operative details: After pain informed consent, the patient was taken the operating.  The patient was placed in supine position operating table.  After induction general anesthesia and placement of laryngeal mask patient's left lower extremities prepped and draped in usual sterile fashion.  An elliptical incision was made incorporating a chronic sinus tract at the tip of the left above-knee amputation stump.  Incision was carried on through subcutaneous tissues and a level of about 1 cm a small bone fragment was encountered that was about 3 x 1 cm.  This was free within the soft tissue.  It was removed.  It was sent for culture.  There was good bleeding from the skin edges.  I did not see any other purulent collection.  I could palpate the femur but still had soft tissue coverage over it.  I did not expose the femur.  The wound was thoroughly irrigated normal saline solution hemostasis obtained with cautery.  A VAC dressing was then applied.  Patient tired procedure well and there were no complications.  Instrument sponge and needle counts correct in the case.  Patient taken to recovery in stable condition.  Wound was 3 cm x 3 cm in length width and 2 cm depth.  Ruta Hinds, MD [facility] [place] Office: [phone]

## 2020-05-22 NOTE — Progress Notes (Signed)
Patient received from PACU to 4e14. Oriented to room and call bell. CHG wipes applied. Tele box connected to patient and CCMD called. VSS. Call bell in reach. Will continue to monitor.  Arletta Bale, RN

## 2020-05-22 NOTE — Anesthesia Preprocedure Evaluation (Signed)
Anesthesia Evaluation  Patient identified by MRN, date of birth, ID band Patient awake    Reviewed: Allergy & Precautions, NPO status , Patient's Chart, lab work & pertinent test results  Airway Mallampati: I  TM Distance: >3 FB Neck ROM: Full    Dental   Pulmonary Current Smoker and Patient abstained from smoking.,    Pulmonary exam normal        Cardiovascular hypertension, Normal cardiovascular exam     Neuro/Psych Anxiety Depression Bipolar Disorder    GI/Hepatic   Endo/Other    Renal/GU      Musculoskeletal   Abdominal   Peds  Hematology   Anesthesia Other Findings   Reproductive/Obstetrics                             Anesthesia Physical Anesthesia Plan  ASA: III  Anesthesia Plan: General   Post-op Pain Management:    Induction: Intravenous  PONV Risk Score and Plan: 2 and Ondansetron and Midazolam  Airway Management Planned: LMA  Additional Equipment:   Intra-op Plan:   Post-operative Plan: Extubation in OR  Informed Consent: I have reviewed the patients History and Physical, chart, labs and discussed the procedure including the risks, benefits and alternatives for the proposed anesthesia with the patient or authorized representative who has indicated his/her understanding and acceptance.       Plan Discussed with: CRNA and Surgeon  Anesthesia Plan Comments:         Anesthesia Quick Evaluation

## 2020-05-22 NOTE — Transfer of Care (Signed)
Immediate Anesthesia Transfer of Care Note  Patient: Alisha [name]  Procedure(s) Performed: IRRIGATION AND DEBRIDEMENT OF LEFT ABOVE KNEE AMPUTATION (Left Leg Upper) Application Of Wound Vac (Left Leg Upper)  Patient Location: PACU  Anesthesia Type:General  Level of Consciousness: drowsy  Airway & Oxygen Therapy: Patient Spontanous Breathing and Patient connected to face mask oxygen  Post-op Assessment: Report given to RN and Post -op Vital signs reviewed and stable  Post vital signs: Reviewed and stable  Last Vitals:  Vitals Value Taken Time  BP 134/111 05/22/20 1717  Temp    Pulse 73 05/22/20 1719  Resp 13 05/22/20 1719  SpO2 94 % 05/22/20 1719  Vitals shown include unvalidated device data.  Last Pain:  Vitals:   05/22/20 1128  TempSrc:   PainSc: 10-Worst pain ever      Patients Stated Pain Goal: 6 ([id number])  Complications: No apparent anesthesia complications

## 2020-05-22 NOTE — Anesthesia Postprocedure Evaluation (Signed)
Anesthesia Post Note  Patient: Alisha [name]  Procedure(s) Performed: IRRIGATION AND DEBRIDEMENT OF LEFT ABOVE KNEE AMPUTATION (Left Leg Upper) Application Of Wound Vac (Left Leg Upper)     Patient location during evaluation: PACU Anesthesia Type: General Level of consciousness: awake and alert Pain management: pain level controlled Vital Signs Assessment: post-procedure vital signs reviewed and stable Respiratory status: spontaneous breathing, nonlabored ventilation, respiratory function stable and patient connected to nasal cannula oxygen Cardiovascular status: blood pressure returned to baseline and stable Postop Assessment: no apparent nausea or vomiting Anesthetic complications: no    Last Vitals:  Vitals:   05/22/20 1855 05/22/20 1857  BP:  120/70  Pulse:  66  Resp:  14  Temp:  36.6 C  SpO2: 93% 94%    Last Pain:  Vitals:   05/22/20 1857  TempSrc: Oral  PainSc:                  [name] DAVID

## 2020-05-23 DIAGNOSIS — I739 Peripheral vascular disease, unspecified: Secondary | ICD-10-CM | POA: Diagnosis not present

## 2020-05-23 MED ORDER — HYDROCODONE-ACETAMINOPHEN 5-325 MG PO TABS: 1.0000 | ORAL_TABLET | Freq: Four times a day (QID) | ORAL | 0 refills | Status: DC | PRN

## 2020-05-23 NOTE — Care Management Obs Status (Signed)
[facility] NOTIFICATION   Patient Details  Name: Alisha [name] MRN: [id number] Date of Birth: June 24, 1961   Medicare Observation Status Notification Given:  Yes    Carles Collet, RN 05/23/2020, 10:14 AM

## 2020-05-23 NOTE — Progress Notes (Signed)
Plan of care reviewed. Pt's hemodynamically stable. Appeared alert and oriented x 4. Remained afebrile. Sinus rhythm on monitor, HR 60s-70s, BP within normal limits. SPO2 90-95% on 2 LPM. Auscultated to have wheezing sound bilaterally. RT notified for bronchodilator breathing treatment.  Severe pain on left leg surgical wound with wound Vac - 125 mmHg, no drainage presented in canister, dressing intact and well sealed. . Dilaudid and Norco given for pain PRN. Will continue to monitor.  Kennyth Lose, RN

## 2020-05-23 NOTE — Progress Notes (Signed)
Discharge instructions given to patient. IV removed, clean and intact. Wheelchair delivered to patient room. Home vac applied. Medications and wound care reviewed. All questions answered. Pt escorted home with daughter.  Arletta Bale, RN

## 2020-05-23 NOTE — Care Management CC44 (Signed)
Condition Code 44 Documentation Completed  Patient Details  Name: Tatsuko Christians MRN: [id number] Date of Birth: 10-06-1961   Condition Code 44 given:  Yes Patient signature on Condition Code 44 notice:  Yes Documentation of 2 MD's agreement:  Yes Code 44 added to claim:  Yes    Carles Collet, RN 05/23/2020, 10:14 AM

## 2020-05-23 NOTE — TOC Transition Note (Addendum)
Transition of Care [facility]) - CM/SW Discharge Note [name] RN, BSN Transitions of Care Unit 4E- RN Case Manager [phone]   Patient Details  Name: Alisha [name] MRN: [id number] Date of Birth: [birth year]  Transition of Care [facility]) CM/SW Contact:  Alisha Patricia, RN Phone Number: 05/23/2020, 2:07 PM   Clinical Narrative:    Pt s/p I&D with wound VAC placement and stable for transition home today once [facility] and DME arranged- pt will need home wound VAC. [employer] home wound VAC form has been singed and faxed to [employer] this AM- spoke with Alisha [name] at [facility] for home wound VAC needs- once approved will be released for delivery to bedside. Spoke with pt at bedside to go over transition needs. Per pt she has a [place] aide with her Medicaid benefits that comes daily from 9-12, and she is active with [facility] for [facility]. Pt reports she has BSC and w/c at home- however w/c is broken and pt requesting a new w/c.  Call made to [facility] to request DME order for new w/c- order has been placed and call made to [facility] with [employer] for DME need- w/c to be delivered to room today prior to discharge  Received notice from [facility] with [employer] at 1250 that home wound VAC has been approved and will be delivered this afternoon in the next 1-2 hours. Updated pt at the bedside.   Notified Alisha [name] with [facility] that pt would be going home with wound VAC and would need home wound VAC drsg changes.   Pt reports she will have transportation home once she knows timeframe.   1600- update- per Alisha [name] with [employer]- on review for w/c- [employer] system showing pt's insurance was billed for power chair in 06/2019- therefor pt can not get manual w/c under insurance at this time- per pt she does not have a power chair- [employer] to speak with pt regarding w/c needs- also spoke with Alisha [name] with [facility] to have [facility] RN- f/u on DME in the home when they go out for [facility] visit.   Final next level of care: [place] Barriers to Discharge: No  Barriers Identified   Patient Goals and CMS Choice Patient states their goals for this hospitalization and ongoing recovery are:: return home CMS Medicare.gov Compare Post Acute Care list provided to:: Patient Choice offered to / list presented to : Patient  Discharge Placement                       Discharge Plan and Services   Discharge Planning Services: CM Consult Post Acute Care Choice: Home Health, Resumption of Svcs/PTA Provider          DME Arranged: Vac, Wheelchair manual DME Agency: Rachelle Hora Date DME Agency Contacted: 05/23/20 Time DME Agency Contacted: 50 Representative spoke with at DME Agency: Alisha [name] HH Arranged: RN [place] Agency: [facility] [place] Date [place]: 05/23/20 Time [place]: 94 Representative spoke with at [place]: [place] ([place]) Interventions     Readmission Risk Interventions Readmission Risk Prevention Plan 05/23/2020 04/05/2019  Post Dischage Appt Complete -  Medication Screening Complete -  Transportation Screening - Complete  PCP or Specialist Appt within 3-5 Days - Complete  HRI or [place] - Complete  Social Work Consult for [place] Planning/Counseling - Complete  Palliative Care Screening - Complete  Medication Review Press photographer) - Complete  Some recent data might be hidden

## 2020-05-23 NOTE — Progress Notes (Addendum)
[facility] [place]  Subjective  - Feeling much better this am, less edema.   Objective 121/86 73 98.1 F (36.7 C) (Oral) 20 95%  Intake/Output Summary (Last 24 hours) at 05/23/2020 0721 Last data filed at 05/23/2020 0600 Gross per 24 hour  Intake 1829.84 ml  Output 50 ml  Net 1779.84 ml    Left AKA wound vac to suction Stump surrounding skin without erythema.  0 OP in wound vac Lungs non labored breathing  Assessment/Planning: POD # 1 Revision left AKA with wound vac placement.   Plan to discharge home today once wound vac and [place] set up. F/U in 2-3 weeks in the office   Roxy Horseman 05/23/2020 7:21 AM --  Agree with above D/c home today with VAC Resume all home meds  Ruta Hinds, MD [facility] [place]: [phone]   Laboratory Lab Results: Recent Labs    05/22/20 1109  WBC 8.4  HGB 17.4*  HCT 54.4*  PLT 226   BMET Recent Labs    05/22/20 1109  NA 139  K 4.0  CL 107  CO2 21*  GLUCOSE 134*  BUN 11  CREATININE 0.92  CALCIUM 9.2    COAG Lab Results  Component Value Date   INR 1.0 05/22/2020   INR 1.1 04/02/2019   INR 1.07 02/14/2019   No results found for: PTT

## 2020-05-23 NOTE — Progress Notes (Addendum)
   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  A walker will not resolve  issue with performing activities of daily living. A wheelchair will allow patient to safely perform daily activities. Patient is not able to propel themselves in the home using a standard weight wheelchair due to general weakness. Patient can self propel in the lightweight wheelchair. Length of need Lifetime. Accessories: elevating leg rests (ELRs), wheel locks, extensions and anti-tippers.  Back cushion  05/23/20 1214

## 2020-05-25 NOTE — Discharge Summary (Signed)
[facility] Discharge Summary   Patient ID:  Alisha [name] MRN: [id number] DOB/AGE: July 20, 1961 59 y.o.  Admit date: 05/22/2020 Discharge date: 05/23/2020 Date of Surgery: 05/22/2020 Surgeon: Surgeon(s): Fields, Jessy Oto, MD  Admission Diagnosis: PAD (peripheral artery disease) [facility]) [I73.9]  Discharge Diagnoses:  PAD (peripheral artery disease) ([place]) [I73.9]  Secondary Diagnoses: Past Medical History:  Diagnosis Date  . Anxiety   . Asthma   . Bipolar disorder ([place])   . Breast lump 02/2008   Biopsy 05/2008: showed no evidence of malignancy  . Breast mass in female    bi lat   . CHF (congestive heart failure) ([place])   . Chronic pain syndrome   . Degenerative joint disease of knee   . Dental caries   . Depression   . 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 Endometrial mass 10/12/2012   Endometrium, biopsy on 11/04/12 - PROLIFERATIVE ENDOMETRIUM AND ABUNDANT MUCUS. NO HYPERPLASIA OR CARCINOMA.   . Fibromyalgia   . Hyperlipidemia   . Hypertension   . Irregular menses 9/08  . Joint pain   . Mixed stress and urge urinary incontinence    [facility], underwent cystoscopy & uroflowmetry   . Pulmonary edema   . PVD (peripheral vascular disease) ([place] )    s/p L fem-pop bypass 11/21/08; graft occluded 12/28/08;  aortogram w/ bilat LE runoff: 1.  bilat diffuse SFA occlusive dz, 2.  Mod to severe above-knee popliteal dz,  3.  Bilat 3-vessel runoff w/ mild tibial occlusive dz.  . Restless leg syndrome   . Sigmoid diverticulitis 10/26/2012  . Tobacco abuse   . Wears glasses     Procedure(s): IRRIGATION AND DEBRIDEMENT OF LEFT ABOVE KNEE AMPUTATION Application Of Wound Vac  Discharged Condition: stable  HPI: Alisha [name] is a 59 y.o. (Sep 20, 1961) female who presents for follow up on non healing left above knee amputation. She has hx of left AKA by Dr. Scot Dock in February of  2020 with subsequent debridement of poorly healing left AKA on 03/29/2019 by Dr. Oneida Alar and excisional debridement of left AKA&Placement of VACon 04/02/2019 by Dr. Scot Dock.  She has been unable to heal the left aka stump and is scheduled for revision.     Hospital Course:  Alisha [name] is a 59 y.o. female is S/P  Procedure(s): IRRIGATION AND DEBRIDEMENT OF LEFT ABOVE KNEE AMPUTATION Application Of Wound Vac  Loose bone fragment found.  Wound vac for home.  Patient discharged POD# 1 in the am.  F/u visit scheduled.     Significant Diagnostic Studies: CBC Lab Results  Component Value Date   WBC 8.4 05/22/2020   HGB 17.4 (H) 05/22/2020   HCT 54.4 (H) 05/22/2020   MCV 96.8 05/22/2020   PLT 226 05/22/2020    BMET    Component Value Date/Time   NA 139 05/22/2020 1109   K 4.0 05/22/2020 1109   CL 107 05/22/2020 1109   CO2 21 (L) 05/22/2020 1109   GLUCOSE 134 (H) 05/22/2020 1109   BUN 11 05/22/2020 1109   CREATININE 0.92 05/22/2020 1109   CREATININE 0.83 07/16/2013 1105   CALCIUM 9.2 05/22/2020 1109   GFRNONAA >60 05/22/2020 1109   GFRNONAA >60 07/29/2011 1220   GFRAA >60 05/22/2020 1109   GFRAA >60 07/29/2011 1220   COAG Lab Results  Component Value Date   INR 1.0 05/22/2020   INR 1.1 04/02/2019   INR 1.07 02/14/2019  Disposition:  Discharge to :Home Discharge Instructions    Call MD for:  redness, tenderness, or signs of infection (pain, swelling, bleeding, redness, odor or green/yellow discharge around incision site)   Complete by: As directed    Call MD for:  severe or increased pain, loss or decreased feeling  in affected limb(s)   Complete by: As directed    Call MD for:  temperature >100.5   Complete by: As directed    Resume previous diet   Complete by: As directed      Allergies as of 05/23/2020      Reactions   Propoxyphene N-acetaminophen Anaphylaxis, Swelling   Strawberry Extract Anaphylaxis, Shortness Of Breath   "I go into shock"    Trazodone And Nefazodone Swelling   Buspar [buspirone] Swelling   "Swells my face"   Grapefruit Extract Other (See Comments)   Interaction with medications she's taking   Tape Itching   Patient says that it itches and swells with some adhesive dressings.      Medication List    TAKE these medications   albuterol 108 (90 Base) MCG/ACT inhaler Commonly known as: Ventolin HFA Inhale 1-2 puffs into the lungs every 6 (six) hours as needed for wheezing or shortness of breath.   aspirin EC 81 MG tablet Take 81 mg by mouth daily.   atorvastatin 40 MG tablet Commonly known as: LIPITOR Take 1 tablet (40 mg total) by mouth at bedtime. Notes to patient: Take as you were at home.   carvedilol 12.5 MG tablet Commonly known as: COREG Take 0.5 tablets (6.25 mg total) by mouth 2 (two) times daily with a meal. What changed: how much to take   collagenase ointment Commonly known as: SANTYL Apply 1 application topically daily. Apply to left stump daily with dressing changes. Notes to patient: Take as you were at home.   diazepam 2 MG tablet Commonly known as: VALIUM Take 1 tablet (2 mg total) by mouth every 12 (twelve) hours as needed for up to 4 doses for anxiety.   DULoxetine 60 MG capsule Commonly known as: CYMBALTA Take 60 mg by mouth 2 (two) times daily.   escitalopram 20 MG tablet Commonly known as: LEXAPRO Take 20 mg by mouth daily.   gabapentin 800 MG tablet Commonly known as: NEURONTIN Take 800-1,600 mg by mouth See admin instructions. Take 800 mg in the morning 800 mg in the afternoon and 1600 mg at bedtime   hydrochlorothiazide 25 MG tablet Commonly known as: HYDRODIURIL Take 0.5 tablets (12.5 mg total) by mouth daily. What changed: how much to take   HYDROcodone-acetaminophen 5-325 MG tablet Commonly known as: NORCO/VICODIN Take 1 tablet by mouth every 6 (six) hours as needed for moderate pain.   hydrOXYzine 25 MG tablet Commonly known as: ATARAX/VISTARIL Take 25  mg by mouth in the morning and at bedtime.   Latuda 120 MG Tabs Generic drug: Lurasidone HCl Take 120 mg by mouth daily.   losartan 25 MG tablet Commonly known as: COZAAR Take 1 tablet (25 mg total) by mouth daily.   melatonin 5 MG Tabs Take 5 mg by mouth at bedtime. Notes to patient: Take as you were at home.   multivitamin with minerals tablet Take 1 tablet by mouth daily. Notes to patient: Take as you were at home.   nicotine 7 mg/24hr patch Commonly known as: NICODERM CQ - dosed in mg/24 hr Place 1 patch (7 mg total) onto the skin daily as needed (nicotine replacement).  polyethylene glycol 17 g packet Commonly known as: MIRALAX / GLYCOLAX Take 17 g by mouth daily as needed for mild constipation.   potassium chloride 10 MEQ tablet Commonly known as: KLOR-CON Take 10 mEq by mouth daily.   vitamin E 180 MG (400 UNITS) capsule Generic drug: vitamin E Take 400 Units by mouth 2 (two) times daily. Notes to patient: Take as you were at home.   Xarelto 10 MG Tabs tablet Generic drug: rivaroxaban Take 10 mg by mouth daily. Notes to patient: Take as you were at home.      Verbal and written Discharge instructions given to the patient. Wound care per Discharge AVS Follow-up Information    Elam Dutch, MD In 2 weeks.   Specialties: Vascular Surgery, Cardiology Why: Office will call you to arrange your appt (sent) Contact information: [place] [address] [place], [facility] Follow up.   Specialty: Home Health Services Why: HHRN resumed- pt will need home wound VAC drsg changed- [employer] home vac arranged- will be delivered to room prior to discharge once approved along with home drsg supplies Contact information: [place] [id number] [phone]        [facility], [facility] Follow up.   Why: wheelchair arranged- to be delivered to room prior to discharge Contact information: [place] [address] [phone]           Signed: Roxy Horseman 05/25/2020, 10:41 AM

## 2020-05-27 LAB — AEROBIC/ANAEROBIC CULTURE W GRAM STAIN (SURGICAL/DEEP WOUND)

## 2020-05-30 ENCOUNTER — Other Ambulatory Visit: Payer: Self-pay | Admitting: Physician Assistant

## 2020-05-30 MED ORDER — HYDROCODONE-ACETAMINOPHEN 5-325 MG PO TABS: 1.0000 | ORAL_TABLET | Freq: Four times a day (QID) | ORAL | 0 refills | Status: AC | PRN

## 2020-05-30 NOTE — Progress Notes (Signed)
Olivia Mackie [facility] with [facility] called to request additional pain medication for pt to use prior to vac change.  Per Dr. Oneida Alar, ok for pt to have Norco 5/325 one po q6h prn pain and with vac change #20 (twenty) no refill sent electronically.  [name], [facility] 05/30/2020 1:40 PM

## 2020-06-07 ENCOUNTER — Telehealth: Payer: Self-pay

## 2020-06-07 NOTE — Telephone Encounter (Signed)
Alisha [name] with [facility] called to say that pt is complaining of pain when taking off the wound vac and she is refusing to have it placed unless they can put something underneath it so it doesn't stick directly to the wound. She has not worn the wound vac since Thursday per Alisha [name]. She will try applying Xeroform to see if that helps with pain and will let us know if patient is compliant after this.   Alisha [name]

## 2020-06-15 ENCOUNTER — Ambulatory Visit: Payer: Medicare Other

## 2020-06-20 LAB — FUNGAL ORGANISM REFLEX

## 2020-06-20 LAB — FUNGUS CULTURE WITH STAIN

## 2020-06-20 LAB — FUNGUS CULTURE RESULT

## 2020-06-21 ENCOUNTER — Emergency Department (HOSPITAL_COMMUNITY)
Admission: EM | Admit: 2020-06-21 | Discharge: 2020-06-22 | Disposition: A | Discharge status: Home / Self Care | Payer: Medicare Other | Attending: Emergency Medicine | Admitting: Emergency Medicine

## 2020-06-21 DIAGNOSIS — Z5321 Procedure and treatment not carried out due to patient leaving prior to being seen by health care provider: Secondary | ICD-10-CM | POA: Diagnosis not present

## 2020-06-21 DIAGNOSIS — R519 Headache, unspecified: Secondary | ICD-10-CM | POA: Diagnosis present

## 2020-06-21 DIAGNOSIS — R220 Localized swelling, mass and lump, head: Secondary | ICD-10-CM | POA: Diagnosis not present

## 2020-06-21 DIAGNOSIS — R221 Localized swelling, mass and lump, neck: Secondary | ICD-10-CM | POA: Insufficient documentation

## 2020-06-21 LAB — CBC
HCT: 50.8 % — ABNORMAL HIGH (ref 36.0–46.0)
Hemoglobin: 16.7 g/dL — ABNORMAL HIGH (ref 12.0–15.0)
MCH: 31 pg (ref 26.0–34.0)
MCHC: 32.9 g/dL (ref 30.0–36.0)
MCV: 94.2 fL (ref 80.0–100.0)
Platelets: 251 10*3/uL (ref 150–400)
RBC: 5.39 MIL/uL — ABNORMAL HIGH (ref 3.87–5.11)
RDW: 12.8 % (ref 11.5–15.5)
WBC: 14.9 10*3/uL — ABNORMAL HIGH (ref 4.0–10.5)
nRBC: 0 % (ref 0.0–0.2)

## 2020-06-21 LAB — BASIC METABOLIC PANEL
Anion gap: 14 (ref 5–15)
BUN: 10 mg/dL (ref 6–20)
CO2: 25 mmol/L (ref 22–32)
Calcium: 9.9 mg/dL (ref 8.9–10.3)
Chloride: 100 mmol/L (ref 98–111)
Creatinine, Ser: 0.93 mg/dL (ref 0.44–1.00)
GFR calc Af Amer: >60 mL/min (ref >60)
GFR calc non Af Amer: >60 mL/min (ref >60)
Glucose, Bld: 148 mg/dL — ABNORMAL HIGH (ref 70–99)
Potassium: 3 mmol/L — ABNORMAL LOW (ref 3.5–5.1)
Sodium: 139 mmol/L (ref 135–145)

## 2020-06-21 NOTE — ED Triage Notes (Signed)
Pt here for eval of L sided facial pain, with swelling and redness noted to hairline, extending down onto neck x 3 days. Sts it is hard to open her mouth d/t swelling and pain. Speaking in full sentences, no obvious airway involvement. Pt recently had L AKA and ran out of pain medication so took several ibuprofen, and she thinks this is an allergic reaction to that.

## 2020-06-22 NOTE — ED Notes (Signed)
Pt did not respond when called for vitals check 

## 2020-06-23 ENCOUNTER — Telehealth: Payer: Self-pay

## 2020-06-23 NOTE — Telephone Encounter (Signed)
Olivia Mackie ([facility] Nurse) called triage to r/s pt's missed appt from last week. Olivia Mackie stated pt did not have transportation. I have spoke to pt and r/s her wound check appt for next week. She has verbalized understanding of this appt.

## 2020-06-29 ENCOUNTER — Other Ambulatory Visit: Payer: Self-pay

## 2020-06-29 ENCOUNTER — Ambulatory Visit (INDEPENDENT_AMBULATORY_CARE_PROVIDER_SITE_OTHER): Payer: Self-pay | Admitting: Physician Assistant

## 2020-06-29 VITALS — BP 118/84 | HR 76 | Temp 97.1°F | Resp 20 | Ht 64.0 in | Wt 232.0 lb

## 2020-06-29 DIAGNOSIS — I739 Peripheral vascular disease, unspecified: Secondary | ICD-10-CM

## 2020-06-29 NOTE — Progress Notes (Signed)
POST OPERATIVE OFFICE NOTE    CC:  F/u for surgery  HPI:  This is a 59 y.o. female who is s/p irrigation and debridement of Left AKA on 05/22/2020 by Dr. Oneida Alar.  Prior to surgery the wound measured 3 x 3 x 2 cm deep. cm.  She has discontinued the wound vac and is now using wet to dry dressing changes.    She states that her right LE feels tight with daily activities.  She is currently not ambulatory, but performs WC mobility with her arms and right LE to propel herself.  She denise wounds or rest pain.    Pt returns today for follow up.   She denise fever and chills, and only has pain with dressing changes.  Allergies  Allergen Reactions  . Propoxyphene N-Acetaminophen Anaphylaxis and Swelling  . Strawberry Extract Anaphylaxis and Shortness Of Breath    "I go into shock"  . Trazodone And Nefazodone Swelling  . Buspar [Buspirone] Swelling    "Swells my face"  . Grapefruit Extract Other (See Comments)    Interaction with medications she's taking  . Tape Itching    Patient says that it itches and swells with some adhesive dressings.    Current Outpatient Medications  Medication Sig Dispense Refill  . albuterol (VENTOLIN HFA) 108 (90 Base) MCG/ACT inhaler Inhale 1-2 puffs into the lungs every 6 (six) hours as needed for wheezing or shortness of breath. 1 Inhaler 2  . aspirin EC 81 MG tablet Take 81 mg by mouth daily.    Marland Kitchen atorvastatin (LIPITOR) 40 MG tablet Take 1 tablet (40 mg total) by mouth at bedtime. 90 tablet 3  . carvedilol (COREG) 12.5 MG tablet Take 0.5 tablets (6.25 mg total) by mouth 2 (two) times daily with a meal. (Patient taking differently: Take 12.5 mg by mouth 2 (two) times daily with a meal. ) 180 tablet 1  . collagenase (SANTYL) ointment Apply 1 application topically daily. Apply to left stump daily with dressing changes. 30 g 1  . diazepam (VALIUM) 2 MG tablet Take 1 tablet (2 mg total) by mouth every 12 (twelve) hours as needed for up to 4 doses for anxiety. 4  tablet 0  . DULoxetine (CYMBALTA) 60 MG capsule Take 60 mg by mouth 2 (two) times daily.     Marland Kitchen escitalopram (LEXAPRO) 20 MG tablet Take 20 mg by mouth daily.     Marland Kitchen gabapentin (NEURONTIN) 800 MG tablet Take 800-1,600 mg by mouth See admin instructions. Take 800 mg in the morning 800 mg in the afternoon and 1600 mg at bedtime    . hydrochlorothiazide (HYDRODIURIL) 25 MG tablet Take 0.5 tablets (12.5 mg total) by mouth daily. (Patient taking differently: Take 25 mg by mouth daily. ) 90 tablet 1  . HYDROcodone-acetaminophen (NORCO/VICODIN) 5-325 MG tablet Take 1 tablet by mouth every 6 (six) hours as needed for moderate pain. 20 tablet 0  . hydrOXYzine (ATARAX/VISTARIL) 25 MG tablet Take 25 mg by mouth in the morning and at bedtime.     . Lurasidone HCl (LATUDA) 120 MG TABS Take 120 mg by mouth daily.     . melatonin 5 MG TABS Take 5 mg by mouth at bedtime.    . Multiple Vitamins-Minerals (MULTIVITAMIN WITH MINERALS) tablet Take 1 tablet by mouth daily.    . nicotine (NICODERM CQ - DOSED IN MG/24 HR) 7 mg/24hr patch Place 1 patch (7 mg total) onto the skin daily as needed (nicotine replacement). 28 patch 0  .  polyethylene glycol (MIRALAX / GLYCOLAX) 17 g packet Take 17 g by mouth daily as needed for mild constipation. 14 each 0  . potassium chloride (KLOR-CON) 10 MEQ tablet Take 10 mEq by mouth daily.    . vitamin E (VITAMIN E) 400 UNIT capsule Take 400 Units by mouth 2 (two) times daily.     Alveda Reasons 10 MG TABS tablet Take 10 mg by mouth daily.  5  . losartan (COZAAR) 25 MG tablet Take 1 tablet (25 mg total) by mouth daily. 90 tablet 3   No current facility-administered medications for this visit.     ROS:  See HPI  Physical Exam: Vitals:   06/29/20 0922  BP: 118/84  Pulse: 76  Resp: 20  Temp: (!) 97.1 F (36.2 C)  SpO2: 95%      Incision:  Is healing now measuring 1 cm x 1 cm x 0.5 cm deep. Extremities:  Left stump is warm to palpation without fluctuance and no tunneling when  probed with Q-tip. Right LE foot and leg without ulcers, skin warm to touch and motor intact.  Weak doppler signals DP/PT/Peroneal intact. Lungs non labored breathing   Assessment/Plan:  This is a 59 y.o. female who is s/p:Irrigation and debridement of left AKA  The left stump appears to be healing and has decreased in size.  She denise fever and chills.  The wound vac has been discontinued and she is now doing wet to dry dressing changes.  The right LE has weak doppler signals without non healing wounds or rest pain.  She has not had ABI's performed in a while.  I will have her f/u in 2-3 weeks for a wound check and ABI study on the right LE for baseline.      Roxy Horseman  PA-C [facility] [phone]  Clinic MD:  Oneida Alar

## 2020-07-04 ENCOUNTER — Other Ambulatory Visit: Payer: Self-pay | Admitting: *Deleted

## 2020-07-04 DIAGNOSIS — I739 Peripheral vascular disease, unspecified: Secondary | ICD-10-CM

## 2020-08-03 ENCOUNTER — Ambulatory Visit: Payer: Medicare Other

## 2020-08-03 ENCOUNTER — Ambulatory Visit (HOSPITAL_COMMUNITY)
Admission: RE | Admit: 2020-08-03 | Discharge: 2020-08-03 | Disposition: A | Discharge status: Home / Self Care | Payer: Medicare Other | Source: Ambulatory Visit | Attending: Vascular Surgery | Admitting: Vascular Surgery

## 2020-08-03 ENCOUNTER — Encounter: Payer: Self-pay | Admitting: Surgery

## 2020-08-03 ENCOUNTER — Other Ambulatory Visit: Payer: Self-pay

## 2020-08-03 DIAGNOSIS — I739 Peripheral vascular disease, unspecified: Secondary | ICD-10-CM

## 2020-08-09 ENCOUNTER — Other Ambulatory Visit: Payer: Self-pay | Admitting: *Deleted

## 2020-08-09 DIAGNOSIS — I739 Peripheral vascular disease, unspecified: Secondary | ICD-10-CM

## 2020-08-28 ENCOUNTER — Ambulatory Visit (HOSPITAL_COMMUNITY)
Admission: RE | Admit: 2020-08-28 | Discharge: 2020-08-28 | Disposition: A | Discharge status: Home / Self Care | Payer: Medicare Other | Source: Ambulatory Visit | Attending: Surgery | Admitting: Surgery

## 2020-08-28 ENCOUNTER — Other Ambulatory Visit: Payer: Self-pay

## 2020-08-28 ENCOUNTER — Ambulatory Visit (INDEPENDENT_AMBULATORY_CARE_PROVIDER_SITE_OTHER): Payer: Medicare Other | Admitting: Physician Assistant

## 2020-08-28 VITALS — BP 129/87 | HR 67 | Temp 98.1°F | Resp 20 | Ht 64.0 in | Wt 220.0 lb

## 2020-08-28 DIAGNOSIS — I739 Peripheral vascular disease, unspecified: Secondary | ICD-10-CM | POA: Insufficient documentation

## 2020-08-28 DIAGNOSIS — T879 Unspecified complications of amputation stump: Secondary | ICD-10-CM

## 2020-08-28 NOTE — Progress Notes (Signed)
Office Note     CC:  follow up Requesting Provider:  Nolene Ebbs, MD  HPI: Leeroy Bock Caroleann Casler is a 59 y.o. (Mar 20, 1961) female who presents for recheck of L AKA incision and evaluation of R LE PAD.  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  She required a debridement on 03/29/19, again on 04/02/19, and again on 05/22/20.  L AKA incision nearly healed except for small dry eschar in mid incision.  She has numbness of R foot however denies rest pain or non healing wounds.  She is worried she will eventually require R leg amputation.   Past Medical History:  Diagnosis Date  . Anxiety   . Asthma   . Bipolar disorder ([place])   . Breast lump 02/2008   Biopsy 05/2008: showed no evidence of malignancy  . Breast mass in female    bi lat   . CHF (congestive heart failure) ([place])   . Chronic pain syndrome   . Degenerative joint disease of knee   . Dental caries   . Depression   . 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 Endometrial mass 10/12/2012   Endometrium, biopsy on 11/04/12 - PROLIFERATIVE ENDOMETRIUM AND ABUNDANT MUCUS. NO HYPERPLASIA OR CARCINOMA.   . Fibromyalgia   . Hyperlipidemia   . Hypertension   . Irregular menses 9/08  . Joint pain   . Mixed stress and urge urinary incontinence    Being followed by [facility], underwent cystoscopy & uroflowmetry   . Pulmonary edema   . PVD (peripheral vascular disease) ([place])    s/p L fem-pop bypass 11/21/08; graft occluded 12/28/08;  aortogram w/ bilat LE runoff: 1.  bilat diffuse SFA occlusive dz, 2.  Mod to severe above-knee popliteal dz,  3.  Bilat 3-vessel runoff w/ mild tibial occlusive dz.  . Restless leg syndrome   . Sigmoid diverticulitis 10/26/2012  . Tobacco abuse   . Wears glasses     Past Surgical History:  Procedure Laterality Date  .  Right external iliac artery stent  11/04/2008  . ABDOMINAL AORTAGRAM     11/04/2008; 10/14/2006  . AMPUTATION Left 02/16/2019   Procedure: AMPUTATION  ABOVE KNEE;  Surgeon: Angelia Mould, MD;  Location: [facility];  Service: Vascular;  Laterality: Left;  . AMPUTATION Left 04/02/2019   Procedure: IRRIGATION AND DEBRIDEMENT LEFT ABOVE KNEE AMPUTATION;  Surgeon: Angelia Mould, MD;  Location: [facility];  Service: Vascular;  Laterality: Left;  . APPLICATION OF WOUND VAC Left 04/02/2019   Procedure: APPLICATION OF WOUND VAC TO LEFT KNEE AMPUTATION;  Surgeon: Angelia Mould, MD;  Location: [facility];  Service: Vascular;  Laterality: Left;  . APPLICATION OF WOUND VAC Left 05/22/2020   Procedure: Application Of Wound Vac;  Surgeon: Elam Dutch, MD;  Location: [facility];  Service: Vascular;  Laterality: Left;  . BREAST LUMPECTOMY W/ NEEDLE LOCALIZATION Right 06/13/2008  . BREAST LUMPECTOMY WITH RADIOACTIVE SEED LOCALIZATION Bilateral 05/03/2014   Procedure: BREAST LUMPECTOMY WITH RADIOACTIVE SEED LOCALIZATION;  Surgeon: Joyice Faster. Cornett, MD;  Location: [facility];  Service: General;  Laterality: Bilateral;  . COLONOSCOPY WITH PROPOFOL  01/25/2014  . FEMORAL ENDARTERECTOMY Left 11/21/2008  . FEMORAL-POPLITEAL BYPASS GRAFT Left 11/21/2008  . I & D EXTREMITY Left 03/29/2019   Procedure: IRRIGATION AND DEBRIDEMENT LEFT ABOVE KNEE AMPUTATION;  Surgeon: Elam Dutch, MD;  Location: [facility];  Service: Vascular;  Laterality: Left;  . I & D EXTREMITY Left  05/22/2020   Procedure: IRRIGATION AND DEBRIDEMENT OF LEFT ABOVE KNEE AMPUTATION;  Surgeon: Elam Dutch, MD;  Location: [facility];  Service: Vascular;  Laterality: Left;  . LEFT HEART CATHETERIZATION WITH CORONARY ANGIOGRAM N/A 02/25/2014   Procedure: LEFT HEART CATHETERIZATION WITH CORONARY ANGIOGRAM;  Surgeon: Peter M Martinique, MD;  Location: [facility] CATH LAB;  Service: Cardiovascular;  Laterality: N/A;  . TONSILLECTOMY    . TUBAL LIGATION      Social History   Socioeconomic History  . Marital status: Divorced    Spouse name: Not on file  . Number of children: 4  . Years of  education: Not on file  . Highest education level: Not on file  Occupational History  . Occupation: disable    Employer: UNEMPLOYED  Tobacco Use  . Smoking status: Current Every Day Smoker    Packs/day: 0.50    Years: 18.00    Pack years: 9.00    Types: Cigarettes  . Smokeless tobacco: Never Used  . Tobacco comment: 1pk 1/2 PACK DAY 2-3 0r none  Vaping Use  . Vaping Use: Never used  Substance and Sexual Activity  . Alcohol use: Not Currently    Alcohol/week: 0.0 standard drinks  . Drug use: No    Comment: hx of marijuana and cocaine use; uses Suboxone patch  . Sexual activity: Never  Other Topics Concern  . Not on file  Social History Narrative   Financial assistance application initiated. Patient needs to submit further paperwork to complete   [facility]  September 11, 2010 2:04 PM   Financial assistance approved for 100% discount at [facility] and has [facility] card   [name]  October 04, 2010 5:29 PM   Social Determinants of Health   Financial Resource Strain:   . Difficulty of Paying Living Expenses: Not on file  Food Insecurity:   . Worried About Charity fundraiser in the Last Year: Not on file  . Ran Out of Food in the Last Year: Not on file  Transportation Needs:   . Lack of Transportation (Medical): Not on file  . Lack of Transportation (Non-Medical): Not on file  Physical Activity:   . Days of Exercise per Week: Not on file  . Minutes of Exercise per Session: Not on file  Stress:   . Feeling of Stress : Not on file  Social Connections:   . Frequency of Communication with Friends and Family: Not on file  . Frequency of Social Gatherings with Friends and Family: Not on file  . Attends Religious Services: Not on file  . Active Member of Clubs or Organizations: Not on file  . Attends Archivist Meetings: Not on file  . Marital Status: Not on file  Intimate Partner Violence:   . Fear of Current or Ex-Partner: Not on file  . Emotionally Abused: Not on  file  . Physically Abused: Not on file  . Sexually Abused: Not on file    Family History  Problem Relation Age of Onset  . Breast cancer Mother   . Depression Mother   . Hyperlipidemia Mother   . Mental retardation Mother   . Hyperlipidemia Father   . Mental retardation Father   . Breast cancer Other        GREAT AUNT  . Depression Sister   . Hyperlipidemia Sister   . Mental retardation Sister   . Mental retardation Daughter     Current Outpatient Medications  Medication Sig Dispense Refill  .  albuterol (VENTOLIN HFA) 108 (90 Base) MCG/ACT inhaler Inhale 1-2 puffs into the lungs every 6 (six) hours as needed for wheezing or shortness of breath. 1 Inhaler 2  . aspirin EC 81 MG tablet Take 81 mg by mouth daily.    Marland Kitchen atorvastatin (LIPITOR) 40 MG tablet Take 1 tablet (40 mg total) by mouth at bedtime. 90 tablet 3  . carvedilol (COREG) 12.5 MG tablet Take 0.5 tablets (6.25 mg total) by mouth 2 (two) times daily with a meal. (Patient taking differently: Take 12.5 mg by mouth 2 (two) times daily with a meal. ) 180 tablet 1  . collagenase (SANTYL) ointment Apply 1 application topically daily. Apply to left stump daily with dressing changes. 30 g 1  . diazepam (VALIUM) 2 MG tablet Take 1 tablet (2 mg total) by mouth every 12 (twelve) hours as needed for up to 4 doses for anxiety. 4 tablet 0  . DULoxetine (CYMBALTA) 60 MG capsule Take 60 mg by mouth 2 (two) times daily.     Marland Kitchen escitalopram (LEXAPRO) 20 MG tablet Take 20 mg by mouth daily.     Marland Kitchen gabapentin (NEURONTIN) 800 MG tablet Take 800-1,600 mg by mouth See admin instructions. Take 800 mg in the morning 800 mg in the afternoon and 1600 mg at bedtime    . hydrochlorothiazide (HYDRODIURIL) 25 MG tablet Take 0.5 tablets (12.5 mg total) by mouth daily. (Patient taking differently: Take 25 mg by mouth daily. ) 90 tablet 1  . HYDROcodone-acetaminophen (NORCO/VICODIN) 5-325 MG tablet Take 1 tablet by mouth every 6 (six) hours as needed for  moderate pain. 20 tablet 0  . hydrOXYzine (ATARAX/VISTARIL) 25 MG tablet Take 25 mg by mouth in the morning and at bedtime.     . Lurasidone HCl (LATUDA) 120 MG TABS Take 120 mg by mouth daily.     . melatonin 5 MG TABS Take 5 mg by mouth at bedtime.    . Multiple Vitamins-Minerals (MULTIVITAMIN WITH MINERALS) tablet Take 1 tablet by mouth daily.    . nicotine (NICODERM CQ - DOSED IN MG/24 HR) 7 mg/24hr patch Place 1 patch (7 mg total) onto the skin daily as needed (nicotine replacement). 28 patch 0  . polyethylene glycol (MIRALAX / GLYCOLAX) 17 g packet Take 17 g by mouth daily as needed for mild constipation. 14 each 0  . potassium chloride (KLOR-CON) 10 MEQ tablet Take 10 mEq by mouth daily.    . vitamin E (VITAMIN E) 400 UNIT capsule Take 400 Units by mouth 2 (two) times daily.     Alveda Reasons 10 MG TABS tablet Take 10 mg by mouth daily.  5  . losartan (COZAAR) 25 MG tablet Take 1 tablet (25 mg total) by mouth daily. 90 tablet 3   No current facility-administered medications for this visit.    Allergies  Allergen Reactions  . Propoxyphene N-Acetaminophen Anaphylaxis and Swelling  . Strawberry Extract Anaphylaxis and Shortness Of Breath    "I go into shock"  . Trazodone And Nefazodone Swelling  . Buspar [Buspirone] Swelling    "Swells my face"  . Grapefruit Extract Other (See Comments)    Interaction with medications she's taking  . Tape Itching    Patient says that it itches and swells with some adhesive dressings.     REVIEW OF SYSTEMS:   [X]  denotes positive finding, [ ]  denotes negative finding Cardiac  Comments:  Chest pain or chest pressure:    Shortness of breath upon exertion:  Short of breath when lying flat:    Irregular heart rhythm:        Vascular    Pain in calf, thigh, or hip brought on by ambulation:    Pain in feet at night that wakes you up from your sleep:     Blood clot in your veins:    Leg swelling:         Pulmonary    Oxygen at home:      Productive cough:     Wheezing:         Neurologic    Sudden weakness in arms or legs:     Sudden numbness in arms or legs:     Sudden onset of difficulty speaking or slurred speech:    Temporary loss of vision in one eye:     Problems with dizziness:         Gastrointestinal    Blood in stool:     Vomited blood:         Genitourinary    Burning when urinating:     Blood in urine:        Psychiatric    Major depression:         Hematologic    Bleeding problems:    Problems with blood clotting too easily:        Skin    Rashes or ulcers:        Constitutional    Fever or chills:      PHYSICAL EXAMINATION:  Vitals:   08/28/20 0834  BP: 129/87  Pulse: 67  Resp: 20  Temp: 98.1 F (36.7 C)  TempSrc: Temporal  SpO2: 95%  Weight: 220 lb (99.8 kg)  Height: 5\' 4"  (1.626 m)    General:  WDWN in NAD; vital signs documented above Gait: Not observed HENT: WNL, normocephalic Pulmonary: normal non-labored breathing , without Rales, rhonchi,  wheezing Cardiac: regular HR Abdomen: soft, NT, no masses Skin: without rashes Vascular Exam/Pulses:  Right Left  DP absent AKA  PT absent AKA   Extremities: without ischemic changes, without Gangrene , without cellulitis; without open wounds; small dry eschar 1x1cm mid incision otherwise incision healed Musculoskeletal: no muscle wasting or atrophy  Neurologic: A&O X 3;  No focal weakness or paresthesias are detected Psychiatric:  The pt has Normal affect.   Non-Invasive Vascular Imaging:   R ABI 0.38 R TBI 0.39    ASSESSMENT/PLAN:: 59 y.o. female here for recheck of L AKA and for R ABI  L AKA all healed except for small dry eschar in mid incision Referral made to [place] for limb shrinker R ABI 0.38 however patient is without rest pain or non healing wounds No indication for revascularization of RLE Recheck R ABI in 1 year Patient will call/return to office if symptoms worsen   Dagoberto Ligas, PA-C [facility] [phone]  Clinic MD:   Trula Slade

## 2021-05-09 ENCOUNTER — Inpatient Hospital Stay
Admit: 2021-05-09 | Discharge: 2021-05-09 | Disposition: A | Discharge status: Home / Self Care | Payer: MEDICARE | Attending: Emergency Medicine

## 2021-05-09 DIAGNOSIS — R55 Syncope and collapse: Secondary | ICD-10-CM

## 2021-05-09 LAB — CBC WITH AUTOMATED DIFF
ABS. BASOPHILS: 0 10*3/uL (ref 0.0–0.1)
ABS. EOSINOPHILS: 0 10*3/uL (ref 0.0–0.4)
ABS. IMM. GRANS.: 0.1 10*3/uL — ABNORMAL HIGH (ref 0.00–0.04)
ABS. LYMPHOCYTES: 2 10*3/uL (ref 0.8–3.5)
ABS. MONOCYTES: 0.7 10*3/uL (ref 0.0–1.0)
ABS. NEUTROPHILS: 8 10*3/uL (ref 1.8–8.0)
ABSOLUTE NRBC: 0 10*3/uL (ref 0.00–0.01)
BASOPHILS: 0 % (ref 0–1)
EOSINOPHILS: 0 % (ref 0–7)
HCT: 48.1 % — ABNORMAL HIGH (ref 35.0–47.0)
HGB: 14.9 g/dL (ref 11.5–16.0)
IMMATURE GRANULOCYTES: 1 % — ABNORMAL HIGH (ref 0.0–0.5)
LYMPHOCYTES: 18 % (ref 12–49)
MCH: 30.2 PG (ref 26.0–34.0)
MCHC: 31 g/dL (ref 30.0–36.5)
MCV: 97.4 FL (ref 80.0–99.0)
MONOCYTES: 6 % (ref 5–13)
MPV: 9.4 FL (ref 8.9–12.9)
NEUTROPHILS: 75 % (ref 32–75)
NRBC: 0 PER 100 WBC
PLATELET: 242 10*3/uL (ref 150–400)
RBC: 4.94 M/uL (ref 3.80–5.20)
RDW: 12.9 % (ref 11.5–14.5)
WBC: 10.8 10*3/uL (ref 3.6–11.0)

## 2021-05-09 LAB — METABOLIC PANEL, COMPREHENSIVE
A-G Ratio: 0.9 — ABNORMAL LOW (ref 1.1–2.2)
ALT (SGPT): 34 U/L (ref 12–78)
AST (SGOT): 18 U/L (ref 15–37)
Albumin: 2.7 g/dL — ABNORMAL LOW (ref 3.5–5.0)
Alk. phosphatase: 83 U/L (ref 45–117)
Anion gap: 4 mmol/L — ABNORMAL LOW (ref 5–15)
BUN/Creatinine ratio: 12 (ref 12–20)
BUN: 12 MG/DL (ref 6–20)
Bilirubin, total: 0.2 MG/DL (ref 0.2–1.0)
CO2: 26 mmol/L (ref 21–32)
Calcium: 9.2 MG/DL (ref 8.5–10.1)
Chloride: 113 mmol/L — ABNORMAL HIGH (ref 97–108)
Creatinine: 1.01 MG/DL (ref 0.55–1.02)
GFR est AA: >60 mL/min/{1.73_m2} (ref >60)
GFR est non-AA: 56 mL/min/{1.73_m2} — ABNORMAL LOW (ref >60)
Globulin: 2.9 g/dL (ref 2.0–4.0)
Glucose: 112 mg/dL — ABNORMAL HIGH (ref 65–100)
Potassium: 4.1 mmol/L (ref 3.5–5.1)
Protein, total: 5.6 g/dL — ABNORMAL LOW (ref 6.4–8.2)
Sodium: 143 mmol/L (ref 136–145)

## 2021-05-09 LAB — EKG, 12 LEAD, INITIAL
Atrial Rate: 75 {beats}/min
Calculated P Axis: 44 degrees
Calculated R Axis: 11 degrees
Calculated T Axis: 59 degrees
Diagnosis: NORMAL
P-R Interval: 128 ms
Q-T Interval: 408 ms
QRS Duration: 86 ms
QTC Calculation (Bezet): 455 ms
Ventricular Rate: 75 {beats}/min

## 2021-05-09 LAB — SAMPLES BEING HELD

## 2021-05-09 LAB — COMPREHENSIVE METABOLIC PANEL
ALT: 34 U/L (ref 12–78)
AST: 18 U/L (ref 15–37)
Albumin/Globulin Ratio: 0.9 — ABNORMAL LOW (ref 1.1–2.2)
Albumin: 2.7 g/dL — ABNORMAL LOW (ref 3.5–5.0)
Alkaline Phosphatase: 83 U/L (ref 45–117)
Anion Gap: 4 mmol/L — ABNORMAL LOW (ref 5–15)
BUN: 12 MG/DL (ref 6–20)
Bun/Cre Ratio: 12 (ref 12–20)
CO2: 26 mmol/L (ref 21–32)
Calcium: 9.2 MG/DL (ref 8.5–10.1)
Chloride: 113 mmol/L — ABNORMAL HIGH (ref 97–108)
Creatinine: 1.01 MG/DL (ref 0.55–1.02)
EGFR IF NonAfrican American: 56 mL/min/{1.73_m2} — ABNORMAL LOW (ref >60)
GFR African American: >60 mL/min/{1.73_m2} (ref >60)
Globulin: 2.9 g/dL (ref 2.0–4.0)
Glucose: 112 mg/dL — ABNORMAL HIGH (ref 65–100)
Potassium: 4.1 mmol/L (ref 3.5–5.1)
Sodium: 143 mmol/L (ref 136–145)
Total Bilirubin: 0.2 MG/DL (ref 0.2–1.0)
Total Protein: 5.6 g/dL — ABNORMAL LOW (ref 6.4–8.2)

## 2021-05-09 LAB — EKG 12-LEAD
Atrial Rate: 75 {beats}/min
Diagnosis: NORMAL
P Axis: 44 degrees
P-R Interval: 128 ms
Q-T Interval: 408 ms
QRS Duration: 86 ms
QTc Calculation (Bazett): 455 ms
R Axis: 11 degrees
T Axis: 59 degrees
Ventricular Rate: 75 {beats}/min

## 2021-05-09 LAB — CBC WITH AUTO DIFFERENTIAL
Basophils %: 0 % (ref 0–1)
Basophils Absolute: 0 10*3/uL (ref 0.0–0.1)
Eosinophils %: 0 % (ref 0–7)
Eosinophils Absolute: 0 10*3/uL (ref 0.0–0.4)
Granulocyte Absolute Count: 0.1 10*3/uL — ABNORMAL HIGH (ref 0.00–0.04)
Hematocrit: 48.1 % — ABNORMAL HIGH (ref 35.0–47.0)
Hemoglobin: 14.9 g/dL (ref 11.5–16.0)
Immature Granulocytes: 1 % — ABNORMAL HIGH (ref 0.0–0.5)
Lymphocytes %: 18 % (ref 12–49)
Lymphocytes Absolute: 2 10*3/uL (ref 0.8–3.5)
MCH: 30.2 PG (ref 26.0–34.0)
MCHC: 31 g/dL (ref 30.0–36.5)
MCV: 97.4 FL (ref 80.0–99.0)
MPV: 9.4 FL (ref 8.9–12.9)
Monocytes %: 6 % (ref 5–13)
Monocytes Absolute: 0.7 10*3/uL (ref 0.0–1.0)
NRBC Absolute: 0 10*3/uL (ref 0.00–0.01)
Neutrophils %: 75 % (ref 32–75)
Neutrophils Absolute: 8 10*3/uL (ref 1.8–8.0)
Nucleated RBCs: 0 PER 100 WBC
Platelets: 242 10*3/uL (ref 150–400)
RBC: 4.94 M/uL (ref 3.80–5.20)
RDW: 12.9 % (ref 11.5–14.5)
WBC: 10.8 10*3/uL (ref 3.6–11.0)

## 2021-05-09 NOTE — ED Provider Notes (Signed)
60 year old female with history of hypertension, anxiety presents after donating plasma today and subsequently becoming hypotensive and slightly vomiting.  Patient reports she feels much improved after IV fluids here in route.  Denies any recent illness, fever, chills, chest pain, shortness of breath.  Patient will need approximately 820 cc of plasma and received approximately 300 cc in route.  Patient has no complaints at this time.    Regular tobacco use  Primary care???[facility]          Past Medical History:   Diagnosis Date   ??? Anxiety and depression    ??? Blind left eye    ??? PTSD (post-traumatic stress disorder)        Past Surgical History:   Procedure Laterality Date   ??? COLONOSCOPY N/A 09/03/2017    COLONOSCOPY performed by Briscoe Deutscher, MD at [facility] ENDOSCOPY         Family History:   Problem Relation Age of Onset   ??? Hypertension Mother    ??? Hypertension Sister    ??? Hypertension Sister    ??? Hypertension Sister        Social History     Socioeconomic History   ??? Marital status: SINGLE     Spouse name: Not on file   ??? Number of children: Not on file   ??? Years of education: Not on file   ??? Highest education level: Not on file   Occupational History   ??? Not on file   Tobacco Use   ??? Smoking status: Current Every Day Smoker     Packs/day: 0.50   ??? Smokeless tobacco: Never Used   Substance and Sexual Activity   ??? Alcohol use: No   ??? Drug use: Yes     Types: Cocaine   ??? Sexual activity: Never   Other Topics Concern   ??? Not on file   Social History Narrative   ??? Not on file     Social Determinants of Health     Financial Resource Strain:    ??? Difficulty of Paying Living Expenses: Not on file   Food Insecurity:    ??? Worried About Running Out of Food in the Last Year: Not on file   ??? Ran Out of Food in the Last Year: Not on file   Transportation Needs:    ??? Lack of Transportation (Medical): Not on file   ??? Lack of Transportation (Non-Medical): Not on file   Physical Activity:    ??? Days of Exercise per Week: Not on file   ???  Minutes of Exercise per Session: Not on file   Stress:    ??? Feeling of Stress : Not on file   Social Connections:    ??? Frequency of Communication with Friends and Family: Not on file   ??? Frequency of Social Gatherings with Friends and Family: Not on file   ??? Attends Religious Services: Not on file   ??? Active Member of Clubs or Organizations: Not on file   ??? Attends Banker Meetings: Not on file   ??? Marital Status: Not on file   Intimate Partner Violence:    ??? Fear of Current or Ex-Partner: Not on file   ??? Emotionally Abused: Not on file   ??? Physically Abused: Not on file   ??? Sexually Abused: Not on file   Housing Stability:    ??? Unable to Pay for Housing in the Last Year: Not on file   ??? Number of  Places Lived in the Last Year: Not on file   ??? Unstable Housing in the Last Year: Not on file         ALLERGIES: Patient has no known allergies.    Review of Systems   Constitutional: Negative for chills and fever.   Respiratory: Negative for cough and shortness of breath.    Cardiovascular: Negative for chest pain.   Gastrointestinal: Positive for vomiting. Negative for abdominal pain and nausea.   Genitourinary: Negative for dysuria and urgency.   Skin: Negative for wound.   Neurological: Positive for syncope. Negative for seizures and headaches.       Vitals:    05/09/21 1552   BP: 116/77   Pulse: (!) 101   Resp: 17   Temp: 97.5 ??F (36.4 ??C)   SpO2: 95%            Physical Exam  Vitals and nursing note reviewed.   Constitutional:       Appearance: She is well-developed.   HENT:      Head: Normocephalic and atraumatic.   Eyes:      Pupils: Pupils are equal, round, and reactive to light.   Neck:      Trachea: No tracheal deviation.   Cardiovascular:      Rate and Rhythm: Normal rate and regular rhythm.      Heart sounds: Normal heart sounds.   Pulmonary:      Effort: Pulmonary effort is normal. No respiratory distress.      Breath sounds: Normal breath sounds. No stridor. No wheezing or rales.   Chest:       Chest wall: No tenderness.   Abdominal:      General: Bowel sounds are normal. There is no distension.      Palpations: Abdomen is soft.      Tenderness: There is no abdominal tenderness. There is no rebound.   Musculoskeletal:         General: No tenderness. Normal range of motion.      Cervical back: Normal range of motion and neck supple.   Skin:     General: Skin is warm and dry.      Coloration: Skin is not pale.      Findings: No rash.   Neurological:      Mental Status: She is alert and oriented to person, place, and time.      Cranial Nerves: No cranial nerve deficit.          MDM  Number of Diagnoses or Management Options  Vasovagal response  Diagnosis management comments: 60 year old female with history of hypertension presents after episode of hypotension/syncope after donating plasma.  Patient is well-appearing after receiving IV fluids in route.  Well-appearing, nontoxic, hemodynamically stable, ambulatory in emergency room without difficulty, neurologically intact.    Plan???Complete 1 L IV fluid hydration, CBC/CMP    Labs unremarkable         Procedures    ED EKG interpretation:  Rhythm: normal sinus rhythm; and regular . Rate (approx.): 75; Axis: normal; P wave: normal; QRS interval: normal ; ST/T wave: normal; Other findings: no ischemia. This EKG was interpreted by Miquel Dunn, MD,ED Provider.    6:06 PM  Patient's results have been reviewed with them.  Patient and/or family have verbally conveyed their understanding and agreement of the patient's signs, symptoms, diagnosis, treatment and prognosis and additionally agree to follow up as recommended or return to the Emergency Room should their condition change prior to  follow-up.  Discharge instructions have also been provided to the patient with some educational information regarding their diagnosis as well a list of reasons why they would want to return to the ER prior to their follow-up appointment should their condition change.

## 2021-05-09 NOTE — ED Notes (Signed)
Patient arrived EMS from the plasma donation center. The facility pulled off 820 cc of fluid which then she became hypotensive with N/V and incontinence. EMS arrived on the scene and bolus given.

## 2021-11-26 DIAGNOSIS — R1013 Epigastric pain: Secondary | ICD-10-CM

## 2021-11-26 NOTE — ED Notes (Signed)
Patient presents to ED via for c/o cramping in her extremities, dry heels and a fast heart rate earlier today(she reports a HR of 110).  She also states she had abdominal pain, and nausea and vomiting. Of note, patient was incontinent of stool upon her arrival and went to the bathroom spending an hour getting cleaned up.

## 2021-11-27 ENCOUNTER — Emergency Department: Admit: 2021-11-27 | Payer: MEDICARE | Primary: Family Medicine

## 2021-11-27 ENCOUNTER — Inpatient Hospital Stay
Admit: 2021-11-27 | Discharge: 2021-11-27 | Disposition: A | Discharge status: Home / Self Care | Payer: MEDICARE | Attending: Emergency Medicine

## 2021-11-27 LAB — CBC WITH AUTOMATED DIFF
ABS. BASOPHILS: 0.1 10*3/uL (ref 0.0–0.1)
ABS. EOSINOPHILS: 0 10*3/uL (ref 0.0–0.4)
ABS. IMM. GRANS.: 0 10*3/uL (ref 0.00–0.04)
ABS. LYMPHOCYTES: 4 10*3/uL — ABNORMAL HIGH (ref 0.8–3.5)
ABS. MONOCYTES: 0.8 10*3/uL (ref 0.0–1.0)
ABS. NEUTROPHILS: 6.7 10*3/uL (ref 1.8–8.0)
ABSOLUTE NRBC: 0 10*3/uL (ref 0.00–0.01)
BASOPHILS: 0 % (ref 0–1)
EOSINOPHILS: 0 % (ref 0–7)
HCT: 40.6 % (ref 35.0–47.0)
HGB: 13.6 g/dL (ref 11.5–16.0)
IMMATURE GRANULOCYTES: 0 % (ref 0.0–0.5)
LYMPHOCYTES: 35 % (ref 12–49)
MCH: 31.2 PG (ref 26.0–34.0)
MCHC: 33.5 g/dL (ref 30.0–36.5)
MCV: 93.1 FL (ref 80.0–99.0)
MONOCYTES: 7 % (ref 5–13)
MPV: 9.3 FL (ref 8.9–12.9)
NEUTROPHILS: 57 % (ref 32–75)
NRBC: 0 PER 100 WBC
PLATELET: 317 10*3/uL (ref 150–400)
RBC: 4.36 M/uL (ref 3.80–5.20)
RDW: 13.4 % (ref 11.5–14.5)
WBC: 11.6 10*3/uL — ABNORMAL HIGH (ref 3.6–11.0)

## 2021-11-27 LAB — EKG, 12 LEAD, INITIAL
Atrial Rate: 101 {beats}/min
Calculated P Axis: 51 degrees
Calculated R Axis: 26 degrees
Calculated T Axis: 130 degrees
P-R Interval: 128 ms
Q-T Interval: 346 ms
QRS Duration: 86 ms
QTC Calculation (Bezet): 448 ms
Ventricular Rate: 101 {beats}/min

## 2021-11-27 LAB — METABOLIC PANEL, COMPREHENSIVE
A-G Ratio: 1.6 (ref 1.1–2.2)
ALT (SGPT): 27 U/L (ref 10–35)
AST (SGOT): 24 U/L (ref 10–35)
Albumin: 3.4 g/dL — ABNORMAL LOW (ref 3.5–5.2)
Alk. phosphatase: 71 U/L (ref 35–104)
Anion gap: 10 mmol/L (ref 5–15)
BUN/Creatinine ratio: 17 (ref 12–20)
BUN: 27 MG/DL — ABNORMAL HIGH (ref 6–20)
Bilirubin, total: 0.2 MG/DL (ref 0.2–1.0)
CO2: 24 mmol/L (ref 22–29)
Calcium: 8.7 MG/DL (ref 8.6–10.0)
Chloride: 105 mmol/L (ref 98–107)
Creatinine: 1.57 MG/DL — ABNORMAL HIGH (ref 0.50–0.90)
Globulin: 2.1 g/dL (ref 2.0–4.0)
Glucose: 144 mg/dL — ABNORMAL HIGH (ref 65–100)
Potassium: 3.8 mmol/L (ref 3.5–5.1)
Protein, total: 5.5 g/dL — ABNORMAL LOW (ref 6.4–8.3)
Sodium: 139 mmol/L (ref 136–145)
eGFR: 38 mL/min/{1.73_m2} — ABNORMAL LOW (ref >60)

## 2021-11-27 LAB — LIPASE
Lipase: 23 U/L (ref 13–60)
Lipase: 23 U/L (ref 13–60)

## 2021-11-27 LAB — EKG 12-LEAD
Atrial Rate: 101 {beats}/min
P Axis: 51 degrees
P-R Interval: 128 ms
Q-T Interval: 346 ms
QRS Duration: 86 ms
QTc Calculation (Bazett): 448 ms
R Axis: 26 degrees
T Axis: 130 degrees
Ventricular Rate: 101 {beats}/min

## 2021-11-27 LAB — COMPREHENSIVE METABOLIC PANEL
ALT: 27 U/L (ref 10–35)
AST: 24 U/L (ref 10–35)
Albumin/Globulin Ratio: 1.6 (ref 1.1–2.2)
Albumin: 3.4 g/dL — ABNORMAL LOW (ref 3.5–5.2)
Alkaline Phosphatase: 71 U/L (ref 35–104)
Anion Gap: 10 mmol/L (ref 5–15)
BUN: 27 MG/DL — ABNORMAL HIGH (ref 6–20)
Bun/Cre Ratio: 17 (ref 12–20)
CO2: 24 mmol/L (ref 22–29)
Calcium: 8.7 MG/DL (ref 8.6–10.0)
Chloride: 105 mmol/L (ref 98–107)
Creatinine: 1.57 MG/DL — ABNORMAL HIGH (ref 0.50–0.90)
ESTIMATED GLOMERULAR FILTRATION RATE: 38 mL/min/{1.73_m2} — ABNORMAL LOW (ref >60)
Globulin: 2.1 g/dL (ref 2.0–4.0)
Glucose: 144 mg/dL — ABNORMAL HIGH (ref 65–100)
Potassium: 3.8 mmol/L (ref 3.5–5.1)
Sodium: 139 mmol/L (ref 136–145)
Total Bilirubin: 0.2 MG/DL (ref 0.2–1.0)
Total Protein: 5.5 g/dL — ABNORMAL LOW (ref 6.4–8.3)

## 2021-11-27 LAB — CBC WITH AUTO DIFFERENTIAL
Basophils %: 0 % (ref 0–1)
Basophils Absolute: 0.1 10*3/uL (ref 0.0–0.1)
Eosinophils %: 0 % (ref 0–7)
Eosinophils Absolute: 0 10*3/uL (ref 0.0–0.4)
Granulocyte Absolute Count: 0 10*3/uL (ref 0.00–0.04)
Hematocrit: 40.6 % (ref 35.0–47.0)
Hemoglobin: 13.6 g/dL (ref 11.5–16.0)
Immature Granulocytes: 0 % (ref 0.0–0.5)
Lymphocytes %: 35 % (ref 12–49)
Lymphocytes Absolute: 4 10*3/uL — ABNORMAL HIGH (ref 0.8–3.5)
MCH: 31.2 PG (ref 26.0–34.0)
MCHC: 33.5 g/dL (ref 30.0–36.5)
MCV: 93.1 FL (ref 80.0–99.0)
MPV: 9.3 FL (ref 8.9–12.9)
Monocytes %: 7 % (ref 5–13)
Monocytes Absolute: 0.8 10*3/uL (ref 0.0–1.0)
NRBC Absolute: 0 10*3/uL (ref 0.00–0.01)
Neutrophils %: 57 % (ref 32–75)
Neutrophils Absolute: 6.7 10*3/uL (ref 1.8–8.0)
Nucleated RBCs: 0 PER 100 WBC
Platelets: 317 10*3/uL (ref 150–400)
RBC: 4.36 M/uL (ref 3.80–5.20)
RDW: 13.4 % (ref 11.5–14.5)
WBC: 11.6 10*3/uL — ABNORMAL HIGH (ref 3.6–11.0)

## 2021-11-27 MED ORDER — ONDANSETRON (PF) 4 MG/2 ML INJECTION
4 mg/2 mL | INTRAMUSCULAR | Status: AC
Start: 2021-11-27 — End: 2021-11-27
  Administered 2021-11-27: 06:00:00 via INTRAVENOUS

## 2021-11-27 MED ORDER — DICYCLOMINE 20 MG TAB
20 mg | ORAL_TABLET | Freq: Four times a day (QID) | ORAL | 0 refills | Status: AC | PRN
Start: 2021-11-27 — End: ?

## 2021-11-27 MED ORDER — LIDOCAINE 2 % MUCOSAL SOLN
2 % | Freq: Once | Status: AC
Start: 2021-11-27 — End: 2021-11-27
  Administered 2021-11-27: 06:00:00 via ORAL

## 2021-11-27 MED ORDER — SODIUM CHLORIDE 0.9% BOLUS IV
0.9 % | Freq: Once | INTRAVENOUS | Status: AC
Start: 2021-11-27 — End: 2021-11-27
  Administered 2021-11-27: 06:00:00 via INTRAVENOUS

## 2021-11-27 MED ORDER — FAMOTIDINE 20 MG TAB
20 mg | ORAL_TABLET | Freq: Two times a day (BID) | ORAL | 0 refills | Status: AC
Start: 2021-11-27 — End: 2021-12-04

## 2021-11-27 MED ORDER — ONDANSETRON 4 MG TAB, RAPID DISSOLVE
4 mg | ORAL_TABLET | Freq: Three times a day (TID) | ORAL | 0 refills | Status: AC | PRN
Start: 2021-11-27 — End: ?

## 2021-11-27 MED FILL — ONDANSETRON (PF) 4 MG/2 ML INJECTION: 4 mg/2 mL | INTRAMUSCULAR | Qty: 2

## 2021-11-27 MED FILL — SODIUM CHLORIDE 0.9 % IV: INTRAVENOUS | Qty: 1000

## 2021-11-27 MED FILL — MAG-AL PLUS 200 MG-200 MG-20 MG/5 ML ORAL SUSPENSION: 200-200-20 mg/5 mL | ORAL | Qty: 30

## 2021-11-27 NOTE — ED Provider Notes (Signed)
60 year old female presenting ER with report of having abdominal pain described as cramping with nausea vomiting diarrhea.  Patient reports having the symptoms for the last day.  Having some muscle cramping.  Reports that her heart rate was elevated at home which is why she decided come to the ER.  Denied any chest pain or shortness of breath.  Having some episodes of vomiting of stomach contents and diarrhea described as loose stools.  Reports having intermittent episodes of diarrhea versus constipation.  Patient's had previous colonoscopies no significant findings.  Has not taken any medications for her symptoms.        EKG: Sinus tachycardia rate of 101 beats minute with normal intervals, normal axis, nonspecific ST/T wave abnormalities.  EKG interpreted by Gaylyn Lambert, MD         Past Medical History:   Diagnosis Date    Anxiety and depression     Blind left eye     PTSD (post-traumatic stress disorder)        Past Surgical History:   Procedure Laterality Date    COLONOSCOPY N/A 09/03/2017    COLONOSCOPY performed by Pollyann Kennedy, MD at [facility] ENDOSCOPY         Family History:   Problem Relation Age of Onset    Hypertension Mother     Hypertension Sister     Hypertension Sister     Hypertension Sister        Social History     Socioeconomic History    Marital status: SINGLE     Spouse name: Not on file    Number of children: Not on file    Years of education: Not on file    Highest education level: Not on file   Occupational History    Not on file   Tobacco Use    Smoking status: Every Day     Packs/day: 0.50     Types: Cigarettes    Smokeless tobacco: Never   Substance and Sexual Activity    Alcohol use: No    Drug use: Yes     Types: Cocaine    Sexual activity: Never   Other Topics Concern    Not on file   Social History Narrative    Not on file     Social Determinants of Health     Financial Resource Strain: Not on file   Food Insecurity: Not on file   Transportation Needs: Not on file   Physical Activity:  Not on file   Stress: Not on file   Social Connections: Not on file   Intimate Partner Violence: Not on file   Housing Stability: Not on file         ALLERGIES: Patient has no known allergies.    Review of Systems   Constitutional:  Negative for chills and fever.   HENT:  Negative for congestion and sore throat.    Eyes:  Negative for pain.   Respiratory:  Negative for shortness of breath.    Cardiovascular:  Positive for palpitations. Negative for chest pain.   Gastrointestinal:  Positive for abdominal pain, diarrhea, nausea and vomiting.   Genitourinary:  Negative for dysuria and flank pain.   Musculoskeletal:  Negative for back pain and neck pain.   Skin:  Negative for rash.   Neurological:  Negative for dizziness and headaches.   All other systems reviewed and are negative.    Vitals:    11/26/21 2305 11/26/21 2305 11/27/21 0221  BP:  114/69 110/66   Pulse:  (!) 117 92   Resp:  20 16   Temp:  98.7 ??F (37.1 ??C) 98.6 ??F (37 ??C)   SpO2:  95% 97%   Weight: 85.7 kg (189 lb)     Height: '5\' 3"'  (1.6 m)              Physical Exam  Vitals and nursing note reviewed.   Constitutional:       Appearance: She is well-developed.   HENT:      Head: Normocephalic.   Eyes:      Conjunctiva/sclera: Conjunctivae normal.   Cardiovascular:      Rate and Rhythm: Normal rate and regular rhythm.   Pulmonary:      Effort: Pulmonary effort is normal. No respiratory distress.      Breath sounds: Normal breath sounds.   Abdominal:      General: Abdomen is flat. Bowel sounds are increased. There is no distension.      Palpations: Abdomen is soft.      Tenderness: There is abdominal tenderness in the epigastric area. There is no right CVA tenderness, left CVA tenderness, guarding or rebound. Negative signs include Murphy's sign and McBurney's sign.   Musculoskeletal:         General: Normal range of motion.      Cervical back: Normal range of motion and neck supple.   Skin:     General: Skin is warm.      Capillary Refill: Capillary refill  takes less than 2 seconds.      Findings: No rash.   Neurological:      Mental Status: She is alert and oriented to person, place, and time.      Comments: No gross motor or sensory deficits        MDM  Number of Diagnoses or Management Options  Abdominal pain, epigastric  Dizziness  Nausea vomiting and diarrhea  Diagnosis management comments: Patient presents with symptoms c/w a viral gastroenteritis (vomiting, diarrhea, nofever) for 1 day(s). Patient appears well. No signs of toxicity or distress. No signs of clinical dehydration. Doubt appendicitis with lack of physical exam features. No evidence of any other emergent medical condition. Discussed symptomatic treatment and they will follow closely with their PCP with return precautions discussed for worsening or new concerning symptoms.         Amount and/or Complexity of Data Reviewed  Clinical lab tests: reviewed  Tests in the radiology section of CPT??: reviewed  Tests in the medicine section of CPT??: reviewed           Procedures          Recent Results (from the past 24 hour(s))   CBC WITH AUTOMATED DIFF    Collection Time: 11/26/21 11:22 PM   Result Value Ref Range    WBC 11.6 (H) 3.6 - 11.0 K/uL    RBC 4.36 3.80 - 5.20 M/uL    HGB 13.6 11.5 - 16.0 g/dL    HCT 40.6 35.0 - 47.0 %    MCV 93.1 80.0 - 99.0 FL    MCH 31.2 26.0 - 34.0 PG    MCHC 33.5 30.0 - 36.5 g/dL    RDW 13.4 11.5 - 14.5 %    PLATELET 317 150 - 400 K/uL    MPV 9.3 8.9 - 12.9 FL    NRBC 0.0 0 PER 100 WBC    ABSOLUTE NRBC 0.00 0.00 - 0.01 K/uL    NEUTROPHILS  57 32 - 75 %    LYMPHOCYTES 35 12 - 49 %    MONOCYTES 7 5 - 13 %    EOSINOPHILS 0 0 - 7 %    BASOPHILS 0 0 - 1 %    IMMATURE GRANULOCYTES 0 0.0 - 0.5 %    ABS. NEUTROPHILS 6.7 1.8 - 8.0 K/UL    ABS. LYMPHOCYTES 4.0 (H) 0.8 - 3.5 K/UL    ABS. MONOCYTES 0.8 0.0 - 1.0 K/UL    ABS. EOSINOPHILS 0.0 0.0 - 0.4 K/UL    ABS. BASOPHILS 0.1 0.0 - 0.1 K/UL    ABS. IMM. GRANS. 0.0 0.00 - 0.04 K/UL    DF AUTOMATED     METABOLIC PANEL, COMPREHENSIVE     Collection Time: 11/26/21 11:22 PM   Result Value Ref Range    Sodium 139 136 - 145 mmol/L    Potassium 3.8 3.5 - 5.1 mmol/L    Chloride 105 98 - 107 mmol/L    CO2 24 22 - 29 mmol/L    Anion gap 10 5 - 15 mmol/L    Glucose 144 (H) 65 - 100 mg/dL    BUN 27 (H) 6 - 20 MG/DL    Creatinine 1.57 (H) 0.50 - 0.90 MG/DL    BUN/Creatinine ratio 17 12 - 20      eGFR 38 (L) >60 ml/min/1.31m    Calcium 8.7 8.6 - 10.0 MG/DL    Bilirubin, total 0.2 0.2 - 1.0 MG/DL    ALT (SGPT) 27 10 - 35 U/L    AST (SGOT) 24 10 - 35 U/L    Alk. phosphatase 71 35 - 104 U/L    Protein, total 5.5 (L) 6.4 - 8.3 g/dL    Albumin 3.4 (L) 3.5 - 5.2 g/dL    Globulin 2.1 2.0 - 4.0 g/dL    A-G Ratio 1.6 1.1 - 2.2     LIPASE    Collection Time: 11/26/21 11:22 PM   Result Value Ref Range    Lipase 23 13 - 60 U/L   EKG, 12 LEAD, INITIAL    Collection Time: 11/27/21 12:32 AM   Result Value Ref Range    Ventricular Rate 101 BPM    Atrial Rate 101 BPM    P-R Interval 128 ms    QRS Duration 86 ms    Q-T Interval 346 ms    QTC Calculation (Bezet) 448 ms    Calculated P Axis 51 degrees    Calculated R Axis 26 degrees    Calculated T Axis 130 degrees    Diagnosis       Sinus tachycardia  Nonspecific ST and T wave abnormality  Abnormal ECG  When compared with ECG of 09-May-2021 15:44,  Nonspecific T wave abnormality now evident in Inferior leads         XR ABD (KUB)    Result Date: 11/27/2021  INDICATION: Abdominal cramping FINDINGS: Single supine view of the abdomen demonstrates a nonspecific intestinal gas pattern. No soft tissue mass or pathological soft tissue calcification is seen. The osseous structures are unremarkable.     Nonspecific intestinal gas pattern.

## 2021-11-27 NOTE — ED Notes (Signed)
Roundtrip set up

## 2023-01-02 ENCOUNTER — Other Ambulatory Visit: Payer: Self-pay | Admitting: *Deleted

## 2023-01-02 DIAGNOSIS — I739 Peripheral vascular disease, unspecified: Secondary | ICD-10-CM

## 2023-01-08 ENCOUNTER — Ambulatory Visit: Payer: 59

## 2023-01-08 ENCOUNTER — Ambulatory Visit (HOSPITAL_COMMUNITY): Admission: RE | Admit: 2023-01-08 | Payer: 59 | Source: Ambulatory Visit

## 2023-01-28 NOTE — Progress Notes (Deleted)
HISTORY AND PHYSICAL     CC:  follow up. Requesting Provider:  Nolene Ebbs, MD  HPI: This is a 62 y.o. female who is here today for follow up for PAD.  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    Pt was last seen *** and at that time, ***  The pt returns today for follow up.  ***  The pt is on a statin for cholesterol management.    The pt is on an aspirin.    Other AC:  Xarelto The pt is on BB, diuretic, ARB for hypertension.  The pt does not have diabetes. Tobacco hx:  ***  Pt does *** have family hx of AAA.  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 NO HYPERPLASIA OR CARCINOMA.    Fibromyalgia    Hyperlipidemia    Hypertension    Irregular menses 9/08   Joint pain    Mixed stress and urge urinary incontinence    Being followed by [facility], underwent cystoscopy & uroflowmetry    Pulmonary edema    PVD (peripheral vascular disease) ([place])    s/p L fem-pop bypass 11/21/08; graft occluded 12/28/08;  aortogram w/ bilat LE runoff: 1.  bilat diffuse SFA occlusive dz, 2.  Mod to severe above-knee popliteal dz,  3.  Bilat 3-vessel runoff w/ mild tibial occlusive dz.   Restless leg syndrome    Sigmoid diverticulitis 10/26/2012   Tobacco abuse    Wears glasses     Past Surgical History:  Procedure Laterality Date    Right external iliac artery stent  11/04/2008   ABDOMINAL AORTAGRAM     11/04/2008; 10/14/2006    AMPUTATION Left 02/16/2019   Procedure: AMPUTATION ABOVE KNEE;  Surgeon: Angelia Mould, MD;  Location: [place];  Service: Vascular;  Laterality: Left;   AMPUTATION Left 04/02/2019   Procedure: IRRIGATION AND DEBRIDEMENT LEFT ABOVE KNEE AMPUTATION;  Surgeon: Angelia Mould, MD;  Location: [place];  Service: Vascular;  Laterality: Left;   APPLICATION OF WOUND VAC Left 04/02/2019   Procedure: APPLICATION OF WOUND VAC TO LEFT KNEE AMPUTATION;  Surgeon: Angelia Mould, MD;  Location: [place];  Service: Vascular;  Laterality: Left;   APPLICATION OF WOUND VAC Left 05/22/2020   Procedure: Application Of Wound Vac;  Surgeon: Elam Dutch, MD;  Location: [place];  Service: Vascular;  Laterality: Left;   BREAST LUMPECTOMY W/ NEEDLE LOCALIZATION Right 06/13/2008   BREAST LUMPECTOMY WITH RADIOACTIVE SEED LOCALIZATION Bilateral 05/03/2014   Procedure: BREAST LUMPECTOMY WITH RADIOACTIVE SEED LOCALIZATION;  Surgeon: Marcello Moores A. Cornett, MD;  Location: [place];  Service: General;  Laterality: Bilateral;   COLONOSCOPY WITH PROPOFOL  01/25/2014   FEMORAL ENDARTERECTOMY Left 11/21/2008   FEMORAL-POPLITEAL BYPASS GRAFT Left 11/21/2008   I & D EXTREMITY Left 03/29/2019   Procedure: IRRIGATION AND  DEBRIDEMENT LEFT ABOVE KNEE AMPUTATION;  Surgeon: Elam Dutch, MD;  Location: [place];  Service: Vascular;  Laterality: Left;   I & D EXTREMITY Left 05/22/2020   Procedure: IRRIGATION AND DEBRIDEMENT OF LEFT ABOVE KNEE AMPUTATION;  Surgeon: Elam Dutch, MD;  Location: [place];  Service: Vascular;  Laterality: Left;   LEFT HEART CATHETERIZATION WITH CORONARY ANGIOGRAM N/A 02/25/2014   Procedure: LEFT HEART CATHETERIZATION WITH CORONARY ANGIOGRAM;  Surgeon: Peter M Martinique, MD;  Location[facility] CATH LAB;  Service: Cardiovascular;  Laterality: N/A;   TONSILLECTOMY     TUBAL LIGATION      Allergies  Allergen Reactions   Propoxyphene N-Acetaminophen Anaphylaxis and Swelling   Strawberry  Extract Anaphylaxis and Shortness Of Breath    "I go into shock"   Trazodone And Nefazodone Swelling   Buspar [Buspirone] Swelling    "Swells my face"   Grapefruit Extract Other (See Comments)    Interaction with medications she's taking   Tape Itching    Patient says that it itches and swells with some adhesive dressings.    Current Outpatient Medications  Medication Sig Dispense Refill   albuterol (VENTOLIN HFA) 108 (90 Base) MCG/ACT inhaler Inhale 1-2 puffs into the lungs every 6 (six) hours as needed for wheezing or shortness of breath. 1 Inhaler 2   aspirin EC 81 MG tablet Take 81 mg by mouth daily.     atorvastatin (LIPITOR) 40 MG tablet Take 1 tablet (40 mg total) by mouth at bedtime. 90 tablet 3   carvedilol (COREG) 12.5 MG tablet Take 0.5 tablets (6.25 mg total) by mouth 2 (two) times daily with a meal. (Patient taking differently: Take 12.5 mg by mouth 2 (two) times daily with a meal. ) 180 tablet 1   collagenase (SANTYL) ointment Apply 1 application topically daily. Apply to left stump daily with dressing changes. 30 g 1   diazepam (VALIUM) 2 MG tablet Take 1 tablet (2 mg total) by mouth every 12 (twelve) hours as needed for up to 4 doses for anxiety. 4 tablet 0   DULoxetine (CYMBALTA) 60 MG capsule Take 60 mg by mouth 2 (two) times daily.      escitalopram (LEXAPRO) 20 MG tablet Take 20 mg by mouth daily.      gabapentin (NEURONTIN) 800 MG tablet Take 800-1,600 mg by mouth See admin instructions. Take 800 mg in the morning 800 mg in the afternoon and 1600 mg at bedtime     hydrochlorothiazide (HYDRODIURIL) 25 MG tablet Take 0.5 tablets (12.5 mg total) by mouth daily. (Patient taking differently: Take 25 mg by mouth daily. ) 90 tablet 1   HYDROcodone-acetaminophen (NORCO/VICODIN) 5-325 MG tablet Take 1 tablet by mouth every 6 (six) hours as needed for moderate pain. 20 tablet 0   hydrOXYzine (ATARAX/VISTARIL) 25 MG tablet Take 25 mg by mouth in the morning and at bedtime.       losartan (COZAAR) 25 MG tablet Take 1 tablet (25 mg total) by mouth daily. 90 tablet 3   Lurasidone HCl (LATUDA) 120 MG TABS Take 120 mg by mouth daily.      melatonin 5 MG TABS Take 5 mg by mouth at bedtime.     Multiple Vitamins-Minerals (MULTIVITAMIN WITH MINERALS) tablet Take 1 tablet by mouth daily.     nicotine (NICODERM CQ - DOSED IN MG/24 HR) 7 mg/24hr patch Place 1 patch (7 mg total) onto the skin daily as needed (nicotine replacement). 28 patch 0   polyethylene glycol (MIRALAX /  GLYCOLAX) 17 g packet Take 17 g by mouth daily as needed for mild constipation. 14 each 0   potassium chloride (KLOR-CON) 10 MEQ tablet Take 10 mEq by mouth daily.     vitamin E (VITAMIN E) 400 UNIT capsule Take 400 Units by mouth 2 (two) times daily.      XARELTO 10 MG TABS tablet Take 10 mg by mouth daily.  5   No current facility-administered medications for this visit.    Family History  Problem Relation Age of Onset   Breast cancer Mother    Depression Mother    Hyperlipidemia Mother    Mental retardation Mother    Hyperlipidemia Father    Mental retardation Father    Breast cancer Other        GREAT AUNT   Depression Sister    Hyperlipidemia Sister    Mental retardation Sister    Mental retardation Daughter     Social History   Socioeconomic History   Marital status: Divorced    Spouse name: Not on file   Number of children: 4   Years of education: Not on file   Highest education level: Not on file  Occupational History   Occupation: disable    Employer: UNEMPLOYED  Tobacco Use   Smoking status: Every Day    Packs/day: 0.50    Years: 18.00    Total pack years: 9.00    Types: Cigarettes   Smokeless tobacco: Never   Tobacco comments:    1pk 1/2 PACK DAY 2-3 0r none  Vaping Use   Vaping Use: Never used  Substance and Sexual Activity   Alcohol use: Not Currently    Alcohol/week: 0.0 standard drinks of alcohol   Drug use: No    Comment: hx of marijuana and cocaine use; uses  Suboxone patch   Sexual activity: Never  Other Topics Concern   Not on file  Social History Narrative   Financial assistance application initiated. Patient needs to submit further paperwork to complete   [facility]  September 11, 2010 2:04 PM   Financial assistance approved for 100% discount at [facility] and has [facility] card   [name]  October 04, 2010 5:29 PM   Social Determinants of Health   Financial Resource Strain: Not on file  Food Insecurity: Not on file  Transportation Needs: Not on file  Physical Activity: Not on file  Stress: Not on file  Social Connections: Not on file  Intimate Partner Violence: Not on file     REVIEW OF SYSTEMS:  *** '[X]'$  denotes positive finding, '[ ]'$  denotes negative finding Cardiac  Comments:  Chest pain or chest pressure:    Shortness of breath upon exertion:    Short of breath when lying flat:    Irregular heart rhythm:        Vascular    Pain in calf, thigh, or hip brought on by ambulation:    Pain in feet at night that wakes you up from your sleep:     Blood clot in your veins:    Leg swelling:         Pulmonary    Oxygen at home:    Productive cough:     Wheezing:         Neurologic    Sudden weakness in arms or legs:     Sudden numbness in arms or legs:     Sudden onset of difficulty speaking or slurred speech:    Temporary loss  of vision in one eye:     Problems with dizziness:         Gastrointestinal    Blood in stool:     Vomited blood:         Genitourinary    Burning when urinating:     Blood in urine:        Psychiatric    Major depression:         Hematologic    Bleeding problems:    Problems with blood clotting too easily:        Skin    Rashes or ulcers:        Constitutional    Fever or chills:      PHYSICAL EXAMINATION:  ***  General:  WDWN in NAD; vital signs documented above Gait: Not observed HENT: WNL, normocephalic Pulmonary: normal non-labored breathing , without wheezing Cardiac:  {Desc; regular/irreg:14544} HR, {With/Without:20273} carotid bruit*** Abdomen: soft, NT; aortic pulse is *** palpable Skin: {With/Without:20273} rashes Vascular Exam/Pulses:  Right Left  Radial {Exam; arterial pulse strength 0-4:30167} {Exam; arterial pulse strength 0-4:30167}  Femoral {Exam; arterial pulse strength 0-4:30167} {Exam; arterial pulse strength 0-4:30167}  Popliteal {Exam; arterial pulse strength 0-4:30167} {Exam; arterial pulse strength 0-4:30167}  DP {Exam; arterial pulse strength 0-4:30167} {Exam; arterial pulse strength 0-4:30167}  PT {Exam; arterial pulse strength 0-4:30167} {Exam; arterial pulse strength 0-4:30167}  Peroneal *** ***   Extremities: {With/Without:20273} ischemic changes, {With/Without:20273} Gangrene , {With/Without:20273} cellulitis; {With/Without:20273} open wounds Musculoskeletal: no muscle wasting or atrophy  Neurologic: A&O X 3 Psychiatric:  The pt has {Desc; normal/abnormal:11317::"Normal"} affect.   Non-Invasive Vascular Imaging:   ABI's/TBI's on 01/29/2023: Right:  *** - Great toe pressure: *** Left:  *** - Great toe pressure: ***   Previous ABI's/TBI's on 08/28/2020: Right:  0.38/0.39 - Great toe pressure: 71 Left:  amputation     ASSESSMENT/PLAN:: 62 y.o. female here for follow up for PAD with hx of left AKA in February 2020 and subsequent debridement  03/29/2019, 04/02/2019, and 05/22/2020.     -*** -continue *** -pt will f/u in *** with ***.   [name], [facility] [phone]  Clinic MD:   Donzetta Matters

## 2023-01-29 ENCOUNTER — Ambulatory Visit: Payer: 59

## 2023-01-29 ENCOUNTER — Ambulatory Visit (HOSPITAL_COMMUNITY): Payer: 59 | Attending: Physician Assistant

## 2024-12-20 ENCOUNTER — Emergency Department (HOSPITAL_COMMUNITY)

## 2024-12-20 ENCOUNTER — Other Ambulatory Visit: Payer: Self-pay

## 2024-12-20 ENCOUNTER — Emergency Department (HOSPITAL_COMMUNITY)
Admission: EM | Admit: 2024-12-20 | Discharge: 2024-12-20 | Disposition: A | Discharge status: Home / Self Care | Attending: Emergency Medicine | Admitting: Emergency Medicine

## 2024-12-20 ENCOUNTER — Encounter (HOSPITAL_COMMUNITY): Payer: Self-pay

## 2024-12-20 DIAGNOSIS — R1030 Lower abdominal pain, unspecified: Secondary | ICD-10-CM | POA: Diagnosis present

## 2024-12-20 DIAGNOSIS — I739 Peripheral vascular disease, unspecified: Secondary | ICD-10-CM | POA: Diagnosis not present

## 2024-12-20 DIAGNOSIS — I509 Heart failure, unspecified: Secondary | ICD-10-CM | POA: Insufficient documentation

## 2024-12-20 DIAGNOSIS — Z79899 Other long term (current) drug therapy: Secondary | ICD-10-CM | POA: Insufficient documentation

## 2024-12-20 DIAGNOSIS — N390 Urinary tract infection, site not specified: Secondary | ICD-10-CM | POA: Diagnosis not present

## 2024-12-20 DIAGNOSIS — R911 Solitary pulmonary nodule: Secondary | ICD-10-CM | POA: Diagnosis not present

## 2024-12-20 DIAGNOSIS — Z7982 Long term (current) use of aspirin: Secondary | ICD-10-CM | POA: Diagnosis not present

## 2024-12-20 DIAGNOSIS — N289 Disorder of kidney and ureter, unspecified: Secondary | ICD-10-CM | POA: Diagnosis not present

## 2024-12-20 DIAGNOSIS — N281 Cyst of kidney, acquired: Secondary | ICD-10-CM

## 2024-12-20 LAB — URINALYSIS, ROUTINE W REFLEX MICROSCOPIC
Bilirubin Urine: NEGATIVE
Glucose, UA: NEGATIVE mg/dL
Ketones, ur: NEGATIVE mg/dL
Nitrite: NEGATIVE
Protein, ur: 100 mg/dL — AB
Specific Gravity, Urine: 1.02 (ref 1.005–1.030)
pH: 6.5 (ref 5.0–8.0)

## 2024-12-20 LAB — COMPREHENSIVE METABOLIC PANEL WITH GFR
ALT: 14 U/L (ref 0–44)
AST: 21 U/L (ref 15–41)
Albumin: 3.8 g/dL (ref 3.5–5.0)
Alkaline Phosphatase: 84 U/L (ref 38–126)
Anion gap: 11 (ref 5–15)
BUN: 24 mg/dL — ABNORMAL HIGH (ref 8–23)
CO2: 24 mmol/L (ref 22–32)
Calcium: 8.9 mg/dL (ref 8.9–10.3)
Chloride: 110 mmol/L (ref 98–111)
Creatinine, Ser: 0.94 mg/dL (ref 0.44–1.00)
GFR, Estimated: >60 mL/min
Glucose, Bld: 107 mg/dL — ABNORMAL HIGH (ref 70–99)
Potassium: 4.5 mmol/L (ref 3.5–5.1)
Sodium: 145 mmol/L (ref 135–145)
Total Bilirubin: 0.2 mg/dL (ref 0.0–1.2)
Total Protein: 7.1 g/dL (ref 6.5–8.1)

## 2024-12-20 LAB — CBC WITH DIFFERENTIAL/PLATELET
Abs Immature Granulocytes: 0.04 K/uL (ref 0.00–0.07)
Basophils Absolute: 0.1 K/uL (ref 0.0–0.1)
Basophils Relative: 1 %
Eosinophils Absolute: 0.2 K/uL (ref 0.0–0.5)
Eosinophils Relative: 3 %
HCT: 47.6 % — ABNORMAL HIGH (ref 36.0–46.0)
Hemoglobin: 15.4 g/dL — ABNORMAL HIGH (ref 12.0–15.0)
Immature Granulocytes: 1 %
Lymphocytes Relative: 35 %
Lymphs Abs: 3.1 K/uL (ref 0.7–4.0)
MCH: 31.9 pg (ref 26.0–34.0)
MCHC: 32.4 g/dL (ref 30.0–36.0)
MCV: 98.6 fL (ref 80.0–100.0)
Monocytes Absolute: 0.6 K/uL (ref 0.1–1.0)
Monocytes Relative: 7 %
Neutro Abs: 4.9 K/uL (ref 1.7–7.7)
Neutrophils Relative %: 53 %
Platelets: 189 K/uL (ref 150–400)
RBC: 4.83 MIL/uL (ref 3.87–5.11)
RDW: 12.2 % (ref 11.5–15.5)
WBC: 8.8 K/uL (ref 4.0–10.5)
nRBC: 0 % (ref 0.0–0.2)

## 2024-12-20 LAB — URINALYSIS, MICROSCOPIC (REFLEX)
RBC / HPF: >50 RBC/hpf (ref 0–5)
WBC, UA: >50 WBC/hpf (ref 0–5)

## 2024-12-20 MED ORDER — OXYCODONE HCL 5 MG PO TABS
5.0000 mg | ORAL_TABLET | Freq: Once | ORAL | Status: AC
  Administered 2024-12-20: 5 mg via ORAL
  Filled 2024-12-20: qty 1

## 2024-12-20 MED ORDER — OXYCODONE-ACETAMINOPHEN 5-325 MG PO TABS: 1.0000 | ORAL_TABLET | Freq: Four times a day (QID) | ORAL | 0 refills | Status: AC | PRN

## 2024-12-20 MED ORDER — SODIUM CHLORIDE 0.9 % IV SOLN
2.0000 g | Freq: Once | INTRAVENOUS | Status: AC
  Administered 2024-12-20: 2 g via INTRAVENOUS
  Filled 2024-12-20: qty 20

## 2024-12-20 MED ORDER — IOHEXOL 350 MG/ML SOLN
100.0000 mL | Freq: Once | INTRAVENOUS | Status: AC | PRN
  Administered 2024-12-20: 100 mL via INTRAVENOUS

## 2024-12-20 MED ORDER — PHENAZOPYRIDINE HCL 100 MG PO TABS
95.0000 mg | ORAL_TABLET | Freq: Once | ORAL | Status: AC
  Administered 2024-12-20: 100 mg via ORAL
  Filled 2024-12-20: qty 1

## 2024-12-20 MED ORDER — CEFDINIR 300 MG PO CAPS: 300.0000 mg | ORAL_CAPSULE | Freq: Two times a day (BID) | ORAL | 0 refills | Status: AC

## 2024-12-20 NOTE — ED Notes (Signed)
 Patient transported to CT

## 2024-12-20 NOTE — ED Provider Notes (Signed)
 " [place] EMERGENCY DEPARTMENT AT [facility] Provider Note   CSN: [id number] Arrival date & time: 12/20/24  9792     Patient presents with: Abdominal Pain   Alisha [name] is a 63 y.o. female.   The history is provided by the patient, the EMS personnel and medical records.  Abdominal Pain Alisha [name] is a 63 y.o. female who presents to the Emergency Department complaining of pain.  She presents to the emergency department for evaluation of lower abdominal pain that started 4 days ago.  She is also having hematuria, urinary frequency.  She had an episode of incontinence at time of ED arrival.  No fever, nausea, vomiting, flank pain, diarrhea.   She has a history of PAD, CHF, left lower extremity AKA  Prior to Admission medications  Medication Sig Start Date End Date Taking? Authorizing Provider  cefdinir  (OMNICEF ) 300 MG capsule Take 1 capsule (300 mg total) by mouth 2 (two) times daily. 12/20/24  Yes Griselda Norris, MD  oxyCODONE -acetaminophen  (PERCOCET/ROXICET) 5-325 MG tablet Take 1 tablet by mouth every 6 (six) hours as needed for severe pain (pain score 7-10). 12/20/24  Yes Griselda Norris, MD  albuterol  (VENTOLIN  HFA) [phone] Base) MCG/ACT inhaler Inhale 1-2 puffs into the lungs every 6 (six) hours as needed for wheezing or shortness of breath. 02/18/18   Wilhelmina Broaden, MD  aspirin  EC 81 MG tablet Take 81 mg by mouth daily.    [provider]  atorvastatin  (LIPITOR) 40 MG tablet Take 1 tablet (40 mg total) by mouth at bedtime. 02/17/18   Wilhelmina Broaden, MD  carvedilol  (COREG ) 12.5 MG tablet Take 0.5 tablets (6.25 mg total) by mouth 2 (two) times daily with a meal. Patient taking differently: Take 12.5 mg by mouth 2 (two) times daily with a meal.  04/08/19   Christobal Guadalajara, MD  collagenase  (SANTYL ) ointment Apply 1 application topically daily. Apply to left stump daily with dressing changes. 09/10/19   Gerome Maurilio HERO, PA-C  diazepam  (VALIUM ) 2 MG  tablet Take 1 tablet (2 mg total) by mouth every 12 (twelve) hours as needed for up to 4 doses for anxiety. 04/08/19   Christobal Guadalajara, MD  DULoxetine  (CYMBALTA ) 60 MG capsule Take 60 mg by mouth 2 (two) times daily.     [provider]  escitalopram  (LEXAPRO ) 20 MG tablet Take 20 mg by mouth daily.     [provider]  gabapentin  (NEURONTIN ) 800 MG tablet Take 800-1,600 mg by mouth See admin instructions. Take 800 mg in the morning 800 mg in the afternoon and 1600 mg at bedtime    [provider]  hydrochlorothiazide  (HYDRODIURIL ) 25 MG tablet Take 0.5 tablets (12.5 mg total) by mouth daily. Patient taking differently: Take 25 mg by mouth daily.  04/08/19   Christobal Guadalajara, MD  HYDROcodone -acetaminophen  (NORCO/VICODIN) 5-325 MG tablet Take 1 tablet by mouth every 6 (six) hours as needed for moderate pain. 05/30/20   Rhyne, Samantha J, PA-C  hydrOXYzine  (ATARAX /VISTARIL ) 25 MG tablet Take 25 mg by mouth in the morning and at bedtime.     [provider]  losartan  (COZAAR ) 25 MG tablet Take 1 tablet (25 mg total) by mouth daily. 04/08/19 05/17/20  Christobal Guadalajara, MD  Lurasidone  HCl (LATUDA ) 120 MG TABS Take 120 mg by mouth daily.     [provider]  melatonin 5 MG TABS Take 5 mg by mouth at bedtime.    [provider]  Multiple Vitamins-Minerals (MULTIVITAMIN WITH  MINERALS) tablet Take 1 tablet by mouth daily.    [provider]  nicotine  (NICODERM CQ  - DOSED IN MG/24 HR) 7 mg/24hr patch Place 1 patch (7 mg total) onto the skin daily as needed (nicotine  replacement). 04/08/19   Christobal Guadalajara, MD  polyethylene glycol (MIRALAX  / GLYCOLAX ) 17 g packet Take 17 g by mouth daily as needed for mild constipation. 04/08/19   Christobal Guadalajara, MD  potassium chloride  (KLOR-CON ) 10 MEQ tablet Take 10 mEq by mouth daily. 11/30/19   [provider]  vitamin E  (VITAMIN E ) 400 UNIT capsule Take 400 Units by mouth 2 (two) times daily.     [provider]  XARELTO  10 MG TABS  tablet Take 10 mg by mouth daily. 10/19/18   [provider]    Allergies: Propoxyphene n-acetaminophen , Strawberry extract, Trazodone and nefazodone, Buspar  [buspirone ], Grapefruit extract, and Tape    Review of Systems  Gastrointestinal:  Positive for abdominal pain.  All other systems reviewed and are negative.   Updated Vital Signs BP (!) 174/93 (BP Location: Right Arm)   Pulse 83   Temp 97.6 F (36.4 C) (Oral)   Resp 20   Ht 5' 4 (1.626 m)   Wt 99.8 kg   LMP 11/22/2015   SpO2 100%   BMI 37.77 kg/m   Physical Exam Vitals and nursing note reviewed.  Constitutional:      Appearance: She is well-developed.  HENT:     Head: Normocephalic and atraumatic.  Cardiovascular:     Rate and Rhythm: Normal rate and regular rhythm.  Pulmonary:     Effort: Pulmonary effort is normal. No respiratory distress.  Abdominal:     Palpations: Abdomen is soft.     Tenderness: There is no abdominal tenderness. There is no guarding or rebound.  Musculoskeletal:        General: No tenderness.     Comments: Left lower extremity above-the-knee amputation.  Bilateral lower extremities are warm.  She does have femoral pulse on the right.  Skin:    General: Skin is warm and dry.  Neurological:     Mental Status: She is alert and oriented to person, place, and time.  Psychiatric:        Behavior: Behavior normal.     (all labs ordered are listed, but only abnormal results are displayed) Labs Reviewed  URINALYSIS, ROUTINE W REFLEX MICROSCOPIC - Abnormal; Notable for the following components:      Result Value   APPearance CLOUDY (*)    Hgb urine dipstick LARGE (*)    Protein, ur 100 (*)    Leukocytes,Ua LARGE (*)    All other components within normal limits  COMPREHENSIVE METABOLIC PANEL WITH GFR - Abnormal; Notable for the following components:   Glucose, Bld 107 (*)    BUN 24 (*)    All other components within normal limits  CBC WITH DIFFERENTIAL/PLATELET - Abnormal;  Notable for the following components:   Hemoglobin 15.4 (*)    HCT 47.6 (*)    All other components within normal limits  URINALYSIS, MICROSCOPIC (REFLEX) - Abnormal; Notable for the following components:   Bacteria, UA FEW (*)    All other components within normal limits  URINE CULTURE    EKG: None  Radiology: CT Angio Chest/Abd/Pel for Dissection W and/or W/WO Result Date: 12/20/2024 CLINICAL DATA:  4 day history of abdominal pain with recent worsening. Stranding noted around the descending thoracic aorta on CT stone study earlier today. Clinical concern for  acute aortic syndrome. EXAM: CT ANGIOGRAPHY CHEST, ABDOMEN AND PELVIS TECHNIQUE: Non-contrast CT of the chest was initially obtained. Multidetector CT imaging through the chest, abdomen and pelvis was performed using the standard protocol during bolus administration of intravenous contrast. Multiplanar reconstructed images and MIPs were obtained and reviewed to evaluate the vascular anatomy. RADIATION DOSE REDUCTION: This exam was performed according to the departmental dose-optimization program which includes automated exposure control, adjustment of the mA and/or kV according to patient size and/or use of iterative reconstruction technique. CONTRAST:  OMNIPAQUE  IOHEXOL  350 MG/ML SOLN COMPARISON:  CT stone study 12/20/2024.  CTA runoff 02/14/2019. FINDINGS: CTA CHEST FINDINGS Cardiovascular: Pre contrast imaging of the chest shows no hyperdense crescent in the wall of the thoracic aorta to suggest the presence of an acute intramural hematoma. Ascending thoracic aorta measures 3.5 cm diameter. Descending thoracic aorta at the level of the diaphragmatic crus is 3.5 cm diameter minimally increased from 3.2 cm diameter on a study from 02/14/2019. imaging after IV contrast administration shows no dissection of the thoracic aorta. Mild atherosclerotic calcification evident. At the level of the diaphragmatic hiatus, the posterior wall of the  descending aorta is ill-defined and as noted on the prior study, there is some subtle very focal haziness in the periaortic fat. Similar appearance noted on previous CT from 02/14/2019. No contrast leak from the aorta in this region evident. Mediastinum/Nodes: No mediastinal lymphadenopathy. There is no hilar lymphadenopathy. The esophagus has normal imaging features. There is no axillary lymphadenopathy. Lungs/Pleura: Centrilobular emphsyema noted. Scattered areas of peripheral atelectasis or scarring evident. No overtly suspicious pulmonary nodule or mass. No focal airspace consolidation. No pulmonary edema or pleural effusion. 5 mm perifissural nodule noted left lung on 68/9. Musculoskeletal: No worrisome lytic or sclerotic osseous abnormality. Review of the MIP images confirms the above findings. CTA ABDOMEN AND PELVIS FINDINGS VASCULAR Aorta: Moderate to advanced atherosclerosis. Abdominal aorta measures up to 3.1 cm maximum diameter with substantial nearly circumferential mural thrombus/plaque. Luminal r diameter of the distal aorta just proximal to the bifurcation is 1.3 x 1.1 cm. Celiac: Patent without evidence of aneurysm, dissection, vasculitis or significant stenosis. Atherosclerotic disease noted at the origin. SMA: Patent without evidence of aneurysm, dissection, vasculitis or significant stenosis. Atherosclerotic disease at the origin. Renals: Both renal arteries are patent without evidence of aneurysm, dissection, vasculitis, fibromuscular dysplasia or significant stenosis. Atherosclerosis proximally. IMA: Unopacified proximally suggesting chronic occlusion. 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 Since 02/14/2019, development of right external iliac artery occlusion starting at the level of the bifurcation. Veins: Choose 1 Review of the MIP images confirms the above findings. NON-VASCULAR Hepatobiliary: No suspicious focal abnormality within the liver  parenchyma. Gallbladder is decompressed. No intrahepatic or extrahepatic biliary dilation. Pancreas: No focal mass lesion. No dilatation of the main duct. No intraparenchymal cyst. No peripancreatic edema. Spleen: No splenomegaly. No suspicious focal mass lesion. Adrenals/Urinary Tract: No adrenal nodule or mass. 2.5 cm lesion upper pole right kidney appears new in the interval since the 2020 exam and approaches water density. No overtly suspicious enhancing renal mass lesion on this arterial phase study. No evidence for hydroureter. Irregular circumferential bladder wall thickening with perivesical edema/inflammation. Stomach/Bowel: Stomach is unremarkable. No gastric wall thickening. No evidence of outlet obstruction. Duodenum is normally positioned as is the ligament of Treitz. No small bowel wall thickening. No small bowel dilatation. The terminal ileum is normal. The appendix is normal. No gross colonic mass. No colonic wall thickening. Moderate  stool volume noted right and transverse colon. Diverticular changes are noted in the left colon without evidence of diverticulitis. Lymphatic: No abdominal lymphadenopathy.  No pelvic lymphadenopathy. Reproductive: Calcified uterine fibroid evident. There is no adnexal mass. Other: No intraperitoneal free fluid. Musculoskeletal: No worrisome lytic or sclerotic osseous abnormality. Review of the MIP images confirms the above findings. IMPRESSION: 1. No evidence for acute intramural hematoma or dissection of the thoracoabdominal aorta. 2. The posterior wall of the descending aorta at the level of the diaphragmatic hiatus is ill-defined and as noted on the prior study, there is some very subtle, very focal haziness in the periaortic fat as noted on the noncontrast CT earlier today. Similar appearance noted on previous CT from 02/14/2019. No contrast leak from the aorta in this region. Imaging features are nonspecific but are felt to be most likely chronic. Subtle changes  of aortitis not entirely excluded. Given the very focal, very subtle nature of these findings, aortic leak is considered unlikely but close clinical correlation warranted. 3. 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 4. Since 02/14/2019, development of right external iliac artery occlusion starting at the level of the bifurcation and extending to the groin. 5. Irregular circumferential bladder wall thickening with perivesical edema/inflammation. Imaging features are compatible with cystitis. 6. 2.5 cm lesion upper pole right kidney appears new in the interval since the 2020 exam and approaches water density. This is likely a cyst. Follow-up ultrasound recommended to confirm. 7. 5 mm perifissural nodule left lung. No follow-up needed if patient is low-risk.This recommendation follows the consensus statement: Guidelines for Management of Incidental Pulmonary Nodules Detected on CT Images: From the Fleischner Society 2017; Radiology 2017; 284:228-243. 8. Aortic Atherosclerosis (ICD10-I70.0) and Emphysema (ICD10-J43.9). Electronically Signed   By: Camellia Candle M.D.   On: 12/20/2024 05:39   CT Renal Stone Study Result Date: 12/20/2024 EXAM: CT UROGRAM 12/20/2024 03:20:00 AM TECHNIQUE: CT of the abdomen and pelvis was performed without the administration of intravenous contrast as per CT urogram protocol. Multiplanar reformatted images as well as MIP urogram images are provided for review. Automated exposure control, iterative reconstruction, and/or weight based adjustment of the mA/kV was utilized to reduce the radiation dose to as low as reasonably achievable. COMPARISON: 03/28/2015 CLINICAL HISTORY: Abdominal/flank pain, stone suspected FINDINGS: LOWER CHEST: Dilated distal descending thoracic aorta at the diaphragmatic crus measuring 35 mm in diameter. This has increased since 2 / 16 / 20. There is trace adjacent stranding ( series 3 image 19). Consider further evaluation with CTA  of the chest and abdomen. LIVER: The liver is unremarkable. GALLBLADDER AND BILE DUCTS: Gallbladder is unremarkable. No biliary ductal dilatation. SPLEEN: No acute abnormality. PANCREAS: No acute abnormality. ADRENAL GLANDS: No acute abnormality. KIDNEYS, URETERS AND BLADDER: No stones in the kidneys or ureters. No hydronephrosis. No perinephric or periureteral stranding. Decompressed thick-walled bladder with adjacent perivesical fat stranding. Findings suggest cystitis. GI AND BOWEL: Stomach demonstrates no acute abnormality. Colonic diverticulosis without evidence of diverticulitis. Normal appendix. There is no bowel obstruction. PERITONEUM AND RETROPERITONEUM: No ascites. No free air. Fat containing umbilical hernia. VASCULATURE: Aortic atherosclerotic calcification. 3.1 cm infrarenal abdominal aortic aneurysm. LYMPH NODES: No lymphadenopathy. REPRODUCTIVE ORGANS: Calcified fibroid in the uterus. BONES AND SOFT TISSUES: No acute osseous abnormality. No focal soft tissue abnormality. IMPRESSION: 1. Cystitis. 2. Dilated distal descending thoracic aorta at the diaphragmatic crus measuring 35 mm in diameter with trace adjacent stranding. Consider further evaluation with CTA of the chest and abdomen. 3. 3.1  cm infrarenal abdominal aortic aneurysm. Recommend follow up in 3 years. Electronically signed by: Norman Gatlin MD 12/20/2024 03:50 AM EST RP Workstation: [id number]     Procedures   Medications Ordered in the ED  cefTRIAXone  (ROCEPHIN ) 2 g in sodium chloride  0.9 % 100 mL IVPB (0 g Intravenous Stopped 12/20/24 0431)  phenazopyridine  (PYRIDIUM ) tablet 100 mg (100 mg Oral Given 12/20/24 0359)  iohexol  (OMNIPAQUE ) 350 MG/ML injection 100 mL (100 mLs Intravenous Contrast Given 12/20/24 0510)  oxyCODONE  (Oxy IR/ROXICODONE ) immediate release tablet 5 mg (5 mg Oral Given 12/20/24 9377)                                    Medical Decision Making Amount and/or Complexity of Data Reviewed Labs:  ordered. Radiology: ordered.  Risk Prescription drug management.   Patient with history of PAD status post left lower extremity amputation on anticoagulation here for evaluation of lower abdominal pain, urinary frequency, incontinence and hematuria.  UA is concerning for UTI in the setting of her symptoms.  CT stone study was obtained, which is negative for obstructing stone but does show changes to her aorta.  Repeat CT scan with contrast demonstrates multiple incidental findings.  Aortic changes do appear to be stable, low index of suspicion for acute aortitis given her presenting complaints.  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  Her right lower extremity is warm, perfused without acute pain.  Suspect this is not an acute finding.  She reports compliance with her anticoagulation.  Discussed with vascular surgeon, Dr. Wenda may follow-up in the office as an outpatient regarding this.  Discussed home care for cystitis.  Will send a culture.  Also discussed incidental findings of renal cyst, pulmonary nodule that will require additional follow-up by PCP.  Discussed return precautions for progressive or concerning symptoms.  She did not have significant improvement in her symptoms with Pyridium .  Will change her medication to oxycodone .     Final diagnoses:  Acute UTI  Pulmonary nodule  PAD (peripheral artery disease)  Renal cyst    ED Discharge Orders          Ordered    cefdinir  (OMNICEF ) 300 MG capsule  2 times daily        12/20/24 0600    oxyCODONE -acetaminophen  (PERCOCET/ROXICET) 5-325 MG tablet  Every 6 hours PRN        12/20/24 0601               Griselda Norris, MD 12/20/24 [phone]  "

## 2024-12-20 NOTE — ED Triage Notes (Signed)
 Pt BIB [facility] from home d/t ABD pain for 4 days.  Got increasingly worse tonight with  dysuria- frequency and hematuria.  Denies N/V/D  BP 140/80 HR 70 RR 18 98% RA Cbg 119 97.2 oral temp

## 2024-12-22 LAB — URINE CULTURE: Culture: 80000 — AB

## 2024-12-23 ENCOUNTER — Telehealth (HOSPITAL_BASED_OUTPATIENT_CLINIC_OR_DEPARTMENT_OTHER): Payer: Self-pay

## 2024-12-23 NOTE — Telephone Encounter (Signed)
 Post ED Visit - Positive Culture Follow-up: Unsuccessful Patient Follow-up  Culture assessed and recommendations reviewed by:  [x]  Leonor Bash, Pharm.D. []  Venetia Gully, Pharm.D., BCPS AQ-ID []  Garrel Crews, Pharm.D., BCPS []  Almarie Lunger, Pharm.D., BCPS []  [place], [address].D., BCPS, AAHIVP []  Rosaline Bihari, Pharm.D., BCPS, AAHIVP []  Massie Rigg, PharmD []  Jodie Rower, PharmD, BCPS  Positive urine culture  []  Patient discharged without antimicrobial prescription and treatment is now indicated [x]  Organism is resistant to prescribed ED discharge antimicrobial []  Patient with positive blood cultures  PLAN:  stop Cefdinir  and start Keflex  500 mg po BID x 5 days per ED provider [place], [place]   Unable to contact patient after 3 attempts, letter will be sent to address on file  Ruth Camelia Elbe 12/23/2024, 10:14 AM

## 2025-04-01 ENCOUNTER — Ambulatory Visit

## 2025-04-01 ENCOUNTER — Ambulatory Visit (HOSPITAL_COMMUNITY)
